# Patient Record
Sex: Female | Born: 1955 | Hispanic: No | Marital: Married | Smoking: Never smoker
Health system: Southern US, Community
[De-identification: ages and names within clinical notes are randomized; demographics above are authoritative.]

## PROBLEM LIST (undated history)

## (undated) DIAGNOSIS — J9601 Acute respiratory failure with hypoxia: Secondary | ICD-10-CM

## (undated) DIAGNOSIS — E669 Obesity, unspecified: Secondary | ICD-10-CM

## (undated) DIAGNOSIS — J9612 Chronic respiratory failure with hypercapnia: Secondary | ICD-10-CM

## (undated) DIAGNOSIS — I10 Essential (primary) hypertension: Secondary | ICD-10-CM

## (undated) DIAGNOSIS — E662 Morbid (severe) obesity with alveolar hypoventilation: Secondary | ICD-10-CM

## (undated) DIAGNOSIS — J45909 Unspecified asthma, uncomplicated: Secondary | ICD-10-CM

## (undated) DIAGNOSIS — G4733 Obstructive sleep apnea (adult) (pediatric): Secondary | ICD-10-CM

## (undated) HISTORY — DX: Acute respiratory failure with hypoxia: J96.01

## (undated) HISTORY — DX: Morbid (severe) obesity due to excess calories: E66.01

## (undated) HISTORY — PX: HERNIA REPAIR: SHX51

## (undated) HISTORY — PX: JOINT REPLACEMENT: SHX530

---

## 2017-09-20 DIAGNOSIS — S8002XA Contusion of left knee, initial encounter: Secondary | ICD-10-CM | POA: Insufficient documentation

## 2017-09-20 DIAGNOSIS — S93492A Sprain of other ligament of left ankle, initial encounter: Secondary | ICD-10-CM | POA: Insufficient documentation

## 2017-09-21 ENCOUNTER — Ambulatory Visit: Payer: Self-pay | Admitting: Family Medicine

## 2018-01-21 ENCOUNTER — Emergency Department (HOSPITAL_COMMUNITY)
Admission: EM | Admit: 2018-01-21 | Discharge: 2018-01-21 | Disposition: A | Discharge status: Home / Self Care | Payer: Self-pay | Attending: Emergency Medicine | Admitting: Emergency Medicine

## 2018-01-21 ENCOUNTER — Encounter (HOSPITAL_COMMUNITY): Payer: Self-pay

## 2018-01-21 DIAGNOSIS — J4521 Mild intermittent asthma with (acute) exacerbation: Secondary | ICD-10-CM | POA: Insufficient documentation

## 2018-01-21 DIAGNOSIS — I10 Essential (primary) hypertension: Secondary | ICD-10-CM | POA: Insufficient documentation

## 2018-01-21 HISTORY — DX: Essential (primary) hypertension: I10

## 2018-01-21 HISTORY — DX: Unspecified asthma, uncomplicated: J45.909

## 2018-01-21 HISTORY — DX: Obesity, unspecified: E66.9

## 2018-01-21 MED ORDER — IPRATROPIUM BROMIDE 0.02 % IN SOLN
1.0000 mg | Freq: Once | RESPIRATORY_TRACT | Status: AC
  Administered 2018-01-21: 1 mg via RESPIRATORY_TRACT
  Filled 2018-01-21: qty 5

## 2018-01-21 MED ORDER — ALBUTEROL (5 MG/ML) CONTINUOUS INHALATION SOLN
10.0000 mg/h | INHALATION_SOLUTION | Freq: Once | RESPIRATORY_TRACT | Status: AC
  Administered 2018-01-21: 10 mg/h via RESPIRATORY_TRACT
  Filled 2018-01-21: qty 20

## 2018-01-21 MED ORDER — METHYLPREDNISOLONE SODIUM SUCC 125 MG IJ SOLR
125.0000 mg | Freq: Once | INTRAMUSCULAR | Status: AC
  Administered 2018-01-21: 125 mg via INTRAMUSCULAR
  Filled 2018-01-21: qty 2

## 2018-01-21 MED ORDER — ALBUTEROL SULFATE (2.5 MG/3ML) 0.083% IN NEBU
INHALATION_SOLUTION | RESPIRATORY_TRACT | Status: AC
  Filled 2018-01-21: qty 6

## 2018-01-21 MED ORDER — ALBUTEROL SULFATE (2.5 MG/3ML) 0.083% IN NEBU
5.0000 mg | INHALATION_SOLUTION | Freq: Once | RESPIRATORY_TRACT | Status: AC
  Administered 2018-01-21: 5 mg via RESPIRATORY_TRACT

## 2018-01-21 MED ORDER — ALBUTEROL SULFATE HFA 108 (90 BASE) MCG/ACT IN AERS: 2.0000 | INHALATION_SPRAY | Freq: Once | RESPIRATORY_TRACT | Status: DC

## 2018-01-21 NOTE — ED Provider Notes (Signed)
TIME SEEN: 12:59 AM  CHIEF COMPLAINT: Asthma exacerbation  HPI: Patient is a 62 year old female with history of hypertension, obesity, asthma who presents to the emergency department with shortness of breath with exertion.  States she has had a cold over the past week and has been wheezing.  No fever.  Has had nonproductive cough.  No chest pain or chest discomfort.  States she needs albuterol inhaler.  Has been using nebulizers at home and is on steroids currently.  Was seen by her PCP last week for the same.  States she is here visiting her father when a nurse noticed that she appeared short of breath walking down the hallway.  ROS: See HPI Constitutional: no fever  Eyes: no drainage  ENT: no runny nose   Cardiovascular:  no chest pain  Resp: no SOB  GI: no vomiting GU: no dysuria Integumentary: no rash  Allergy: no hives  Musculoskeletal: no leg swelling  Neurological: no slurred speech ROS otherwise negative  PAST MEDICAL HISTORY/PAST SURGICAL HISTORY:  Past Medical History:  Diagnosis Date  . Asthma   . Hypertension   . Obesity     MEDICATIONS:  Prior to Admission medications   Not on File    ALLERGIES:  No Known Allergies  SOCIAL HISTORY:  Social History   Tobacco Use  . Smoking status: Never Smoker  . Smokeless tobacco: Never Used  Substance Use Topics  . Alcohol use: Never    Frequency: Never    FAMILY HISTORY: No family history on file.  EXAM: BP (!) 155/84   Pulse 96   Temp 97.8 F (36.6 C) (Oral)   Resp 18   SpO2 96%  CONSTITUTIONAL: Alert and oriented and responds appropriately to questions. Well-appearing; well-nourished obese HEAD: Normocephalic EYES: Conjunctivae clear, pupils appear equal, EOMI ENT: normal nose; moist mucous membranes NECK: Supple, no meningismus, no nuchal rigidity, no LAD  CARD: RRR; S1 and S2 appreciated; no murmurs, no clicks, no rubs, no gallops RESP: Normal chest excursion without splinting or tachypnea; taking full  sentences, expiratory wheezes on examination, diminished at bases bilaterally, no rhonchi or rales ABD/GI: Normal bowel sounds; non-distended; soft, non-tender, no rebound, no guarding, no peritoneal signs, no hepatosplenomegaly BACK:  The back appears normal and is non-tender to palpation, there is no CVA tenderness EXT: Normal ROM in all joints; non-tender to palpation; no edema; normal capillary refill; no cyanosis, no calf tenderness or swelling    SKIN: Normal color for age and race; warm; no rash NEURO: Moves all extremities equally PSYCH: The patient's mood and manner are appropriate. Grooming and personal hygiene are appropriate.  MEDICAL DECISION MAKING: Patient here with asthma exacerbation.  Will give breathing treatment and reassess.  At this time she refuses labs, chest x-ray.  Low suspicion for pneumothorax.  Does not appear volume overloaded.  States she recently had a chest x-ray several days ago with her PCP that did not show pneumonia.  ED PROGRESS: Patient still wheezing after one breathing treatment.  Will give continuous treatment and IM Solu-Medrol.   Patient's lungs are now clear after continuous treatment.  She is not hypoxic.  I feel she is safe to be discharged.  She is already on prednisone.  Will discharge with albuterol inhaler.  Discussed return precautions.  Suspect viral illness exacerbating her symptoms.  I do not feel she needs antibiotics.   At this time, I do not feel there is any life-threatening condition present. I have reviewed and discussed all results (EKG, imaging,  lab, urine as appropriate) and exam findings with patient/family. I have reviewed nursing notes and appropriate previous records.  I feel the patient is safe to be discharged home without further emergent workup and can continue workup as an outpatient as needed. Discussed usual and customary return precautions. Patient/family verbalize understanding and are comfortable with this plan.  Outpatient  follow-up has been provided if needed. All questions have been answered.   CRITICAL CARE Performed by: Rochele Raring   Total critical care time: 40 minutes  Critical care time was exclusive of separately billable procedures and treating other patients.  Critical care was necessary to treat or prevent imminent or life-threatening deterioration.  Critical care was time spent personally by me on the following activities: development of treatment plan with patient and/or surrogate as well as nursing, discussions with consultants, evaluation of patient's response to treatment, examination of patient, obtaining history from patient or surrogate, ordering and performing treatments and interventions, ordering and review of laboratory studies, ordering and review of radiographic studies, pulse oximetry and re-evaluation of patient's condition.      Ward, Layla Maw, DO 01/21/18 [phone]

## 2018-01-21 NOTE — ED Triage Notes (Signed)
Pt states that she has asthma ans has been wheezing all week, took a breathing treatment today. Was visiting her father today and unable to ambulate down the hallway.

## 2018-01-21 NOTE — ED Notes (Signed)
Pt discharged from ED; instructions provided; Pt encouraged to return to ED if symptoms worsen and to f/u with PCP; Pt verbalized understanding of all instructions 

## 2018-06-17 ENCOUNTER — Telehealth: Payer: Self-pay | Admitting: Internal Medicine

## 2018-06-17 NOTE — Telephone Encounter (Signed)
Copied from CRM [id number]. Topic: Appointment Scheduling - Prior Auth Required for Appointment >> Jun 17, 2018 10:17 AM Jolayne Haines L wrote: Patient states her mother is a patient of Dr Okey Dupre and was highly recommended to her by Brooks Sailors (mother). Patient just moved her from [place]. Right now she does not have insurance but will in about 60 days with her husbands company. She also would like her son to be established as well. Please Advise.

## 2018-06-18 NOTE — Telephone Encounter (Signed)
Patient is aware of provider response.  Patient does need to be seen before the 60 day time frame.  She is going to call back tomorrow to schedule new patient appointment with Dr. Okey Ryan.  Patient is aware that she will be self pay $80.00 up front.

## 2018-06-18 NOTE — Telephone Encounter (Signed)
Ok, would recommend to wait until they have insurance

## 2018-07-27 ENCOUNTER — Emergency Department (HOSPITAL_COMMUNITY): Payer: Self-pay

## 2018-07-27 ENCOUNTER — Encounter: Payer: Self-pay | Admitting: Emergency Medicine

## 2018-07-27 ENCOUNTER — Inpatient Hospital Stay (HOSPITAL_COMMUNITY)
Admission: EM | Admit: 2018-07-27 | Discharge: 2018-07-31 | DRG: 189 | Disposition: A | Discharge status: Home Health | Payer: Self-pay | Attending: Internal Medicine | Admitting: Internal Medicine

## 2018-07-27 DIAGNOSIS — T380X5A Adverse effect of glucocorticoids and synthetic analogues, initial encounter: Secondary | ICD-10-CM | POA: Diagnosis not present

## 2018-07-27 DIAGNOSIS — Z79899 Other long term (current) drug therapy: Secondary | ICD-10-CM

## 2018-07-27 DIAGNOSIS — R590 Localized enlarged lymph nodes: Secondary | ICD-10-CM | POA: Diagnosis present

## 2018-07-27 DIAGNOSIS — N179 Acute kidney failure, unspecified: Secondary | ICD-10-CM | POA: Diagnosis present

## 2018-07-27 DIAGNOSIS — R059 Cough, unspecified: Secondary | ICD-10-CM

## 2018-07-27 DIAGNOSIS — R05 Cough: Secondary | ICD-10-CM

## 2018-07-27 DIAGNOSIS — I130 Hypertensive heart and chronic kidney disease with heart failure and stage 1 through stage 4 chronic kidney disease, or unspecified chronic kidney disease: Secondary | ICD-10-CM | POA: Diagnosis present

## 2018-07-27 DIAGNOSIS — J4540 Moderate persistent asthma, uncomplicated: Secondary | ICD-10-CM | POA: Insufficient documentation

## 2018-07-27 DIAGNOSIS — F419 Anxiety disorder, unspecified: Secondary | ICD-10-CM | POA: Diagnosis present

## 2018-07-27 DIAGNOSIS — M25561 Pain in right knee: Secondary | ICD-10-CM | POA: Diagnosis present

## 2018-07-27 DIAGNOSIS — R0902 Hypoxemia: Secondary | ICD-10-CM

## 2018-07-27 DIAGNOSIS — Z8249 Family history of ischemic heart disease and other diseases of the circulatory system: Secondary | ICD-10-CM

## 2018-07-27 DIAGNOSIS — R0602 Shortness of breath: Secondary | ICD-10-CM

## 2018-07-27 DIAGNOSIS — Z87891 Personal history of nicotine dependence: Secondary | ICD-10-CM

## 2018-07-27 DIAGNOSIS — Z20828 Contact with and (suspected) exposure to other viral communicable diseases: Secondary | ICD-10-CM | POA: Diagnosis present

## 2018-07-27 DIAGNOSIS — J9601 Acute respiratory failure with hypoxia: Principal | ICD-10-CM

## 2018-07-27 DIAGNOSIS — I50810 Right heart failure, unspecified: Secondary | ICD-10-CM | POA: Diagnosis present

## 2018-07-27 DIAGNOSIS — G4700 Insomnia, unspecified: Secondary | ICD-10-CM | POA: Diagnosis present

## 2018-07-27 DIAGNOSIS — Z6841 Body Mass Index (BMI) 40.0 and over, adult: Secondary | ICD-10-CM

## 2018-07-27 DIAGNOSIS — I10 Essential (primary) hypertension: Secondary | ICD-10-CM

## 2018-07-27 DIAGNOSIS — J441 Chronic obstructive pulmonary disease with (acute) exacerbation: Secondary | ICD-10-CM | POA: Insufficient documentation

## 2018-07-27 DIAGNOSIS — N289 Disorder of kidney and ureter, unspecified: Secondary | ICD-10-CM

## 2018-07-27 DIAGNOSIS — J45901 Unspecified asthma with (acute) exacerbation: Secondary | ICD-10-CM | POA: Diagnosis present

## 2018-07-27 DIAGNOSIS — G8929 Other chronic pain: Secondary | ICD-10-CM

## 2018-07-27 DIAGNOSIS — X500XXA Overexertion from strenuous movement or load, initial encounter: Secondary | ICD-10-CM

## 2018-07-27 DIAGNOSIS — N183 Chronic kidney disease, stage 3 (moderate): Secondary | ICD-10-CM | POA: Diagnosis present

## 2018-07-27 LAB — CBC WITH DIFFERENTIAL/PLATELET
Abs Immature Granulocytes: 0.06 10*3/uL (ref 0.00–0.07)
Basophils Absolute: 0 10*3/uL (ref 0.0–0.1)
Basophils Relative: 0 %
Eosinophils Absolute: 0.1 10*3/uL (ref 0.0–0.5)
Eosinophils Relative: 1 %
HCT: 37.9 % (ref 36.0–46.0)
Hemoglobin: 11.2 g/dL — ABNORMAL LOW (ref 12.0–15.0)
Immature Granulocytes: 1 %
Lymphocytes Relative: 23 %
Lymphs Abs: 2.2 10*3/uL (ref 0.7–4.0)
MCH: 28.6 pg (ref 26.0–34.0)
MCHC: 29.6 g/dL — ABNORMAL LOW (ref 30.0–36.0)
MCV: 96.7 fL (ref 80.0–100.0)
Monocytes Absolute: 0.7 10*3/uL (ref 0.1–1.0)
Monocytes Relative: 7 %
Neutro Abs: 6.5 10*3/uL (ref 1.7–7.7)
Neutrophils Relative %: 68 %
Platelets: 259 10*3/uL (ref 150–400)
RBC: 3.92 MIL/uL (ref 3.87–5.11)
RDW: 12.8 % (ref 11.5–15.5)
WBC: 9.6 10*3/uL (ref 4.0–10.5)
nRBC: 0 % (ref 0.0–0.2)

## 2018-07-27 LAB — COMPREHENSIVE METABOLIC PANEL
ALT: 16 U/L (ref 0–44)
AST: 17 U/L (ref 15–41)
Albumin: 3.6 g/dL (ref 3.5–5.0)
Alkaline Phosphatase: 64 U/L (ref 38–126)
Anion gap: 11 (ref 5–15)
BUN: 12 mg/dL (ref 8–23)
CO2: 32 mmol/L (ref 22–32)
Calcium: 10.2 mg/dL (ref 8.9–10.3)
Chloride: 97 mmol/L — ABNORMAL LOW (ref 98–111)
Creatinine, Ser: 1.24 mg/dL — ABNORMAL HIGH (ref 0.44–1.00)
GFR calc Af Amer: 54 mL/min — ABNORMAL LOW (ref >60)
GFR calc non Af Amer: 47 mL/min — ABNORMAL LOW (ref >60)
Glucose, Bld: 124 mg/dL — ABNORMAL HIGH (ref 70–99)
Potassium: 4.1 mmol/L (ref 3.5–5.1)
Sodium: 140 mmol/L (ref 135–145)
Total Bilirubin: 0.4 mg/dL (ref 0.3–1.2)
Total Protein: 7.3 g/dL (ref 6.5–8.1)

## 2018-07-27 LAB — LACTATE DEHYDROGENASE: LDH: 156 U/L (ref 98–192)

## 2018-07-27 LAB — TROPONIN I: Troponin I: <0.03 ng/mL (ref <0.03)

## 2018-07-27 LAB — C-REACTIVE PROTEIN: CRP: 2.5 mg/dL — ABNORMAL HIGH (ref <1.0)

## 2018-07-27 LAB — LACTIC ACID, PLASMA
Lactic Acid, Venous: 1.6 mmol/L (ref 0.5–1.9)
Lactic Acid, Venous: 3.1 mmol/L (ref 0.5–1.9)

## 2018-07-27 LAB — TRIGLYCERIDES: Triglycerides: 120 mg/dL (ref <150)

## 2018-07-27 LAB — FIBRINOGEN: Fibrinogen: 506 mg/dL — ABNORMAL HIGH (ref 210–475)

## 2018-07-27 LAB — PROCALCITONIN: Procalcitonin: <0.1 ng/mL

## 2018-07-27 LAB — SARS CORONAVIRUS 2 BY RT PCR (HOSPITAL ORDER, PERFORMED IN ~~LOC~~ HOSPITAL LAB): SARS Coronavirus 2: NEGATIVE

## 2018-07-27 LAB — FERRITIN: Ferritin: 66 ng/mL (ref 11–307)

## 2018-07-27 LAB — D-DIMER, QUANTITATIVE (NOT AT ARMC): D-Dimer, Quant: 1.5 ug/mL-FEU — ABNORMAL HIGH (ref 0.00–0.50)

## 2018-07-27 MED ORDER — ACETAMINOPHEN 500 MG PO TABS: 1000.0000 mg | ORAL_TABLET | Freq: Four times a day (QID) | ORAL | Status: DC | PRN

## 2018-07-27 MED ORDER — METHYLPREDNISOLONE SODIUM SUCC 125 MG IJ SOLR
125.0000 mg | Freq: Once | INTRAMUSCULAR | Status: AC
  Administered 2018-07-27: 125 mg via INTRAVENOUS
  Filled 2018-07-27: qty 2

## 2018-07-27 MED ORDER — ALBUTEROL SULFATE HFA 108 (90 BASE) MCG/ACT IN AERS
4.0000 | INHALATION_SPRAY | Freq: Once | RESPIRATORY_TRACT | Status: AC
  Administered 2018-07-27: 4 via RESPIRATORY_TRACT
  Filled 2018-07-27: qty 6.7

## 2018-07-27 MED ORDER — ALBUTEROL SULFATE HFA 108 (90 BASE) MCG/ACT IN AERS
1.0000 | INHALATION_SPRAY | RESPIRATORY_TRACT | Status: DC | PRN
  Filled 2018-07-27: qty 6.7

## 2018-07-27 MED ORDER — MAGNESIUM SULFATE 2 GM/50ML IV SOLN
2.0000 g | Freq: Once | INTRAVENOUS | Status: AC
  Administered 2018-07-27: 2 g via INTRAVENOUS
  Filled 2018-07-27: qty 50

## 2018-07-27 MED ORDER — IPRATROPIUM BROMIDE HFA 17 MCG/ACT IN AERS
2.0000 | INHALATION_SPRAY | Freq: Once | RESPIRATORY_TRACT | Status: DC
  Filled 2018-07-27: qty 12.9

## 2018-07-27 NOTE — ED Notes (Signed)
Pt requesting some coke and crackers  Given

## 2018-07-27 NOTE — ED Notes (Signed)
Pt reports that she does not need to [place] either inhaler at present she is just hungry

## 2018-07-27 NOTE — H&P (Addendum)
Date: 07/27/2018               Patient Name:  Janice PootDonna Ryan MRN: [id number]  DOB: 10/09/1955 Age / Sex: 63 y.o., female   [facility]         Medical Service: Internal Medicine Teaching Service         Attending Physician: Dr. Rush Landmarkegeler, Canary Brimhristopher J, *    First Contact: Dr. Jodelle RedAgyei, Obed Pager: [phone]  Second Contact: Dr. Ginette OttoMelvin, Alec Pager: [phone]       After Hours (After 5p/  First Contact Pager: [phone]  weekends / holidays): Second Contact Pager: [phone]   Chief Complaint: Wheezing  History of Present Illness:  [name] is a 63 yo F w/ PMH of HTN, Obesity and asthma presenting with shortness of breath with wheezing. She was in her usual state of health until 2 weeks ago when began to have worsening shortness of breath. She states she has had similar symptoms in the past w/ prior asthma exacerbation but none with such severity. She used her nebulizers at home about 4 times a day with mild relief. However, in the last 2 days she had acutely worsening symptoms with fever (~100F per patient report. States EMS told her she had 101.57F), increased orthopnea and lower extremity swelling as well as dull chest and back pain exacerbated by coughing. She states she has been having mostly dry cough with minimal sputum. She states she has 2 pillow orthopnea at baseline but had to increase to 3. She mentions that [facility] and was advised to come to the ED for evaluation.   She states she has no history of any cardiac disease but states she was prescribed furosemide 40mg  couple months ago by her PCP for the swelling. She denies any long travel, car ride, recent surgery, hx of malignancy or recent smoking history.  She also mentions that early this morning she heard a loud pop on her right knee while lifting some heavy suitcases and started having significant right knee pain with difficulty ambulating. She believes she may have  torn a ligament.   Per ED provider, EMS noted her oxygen saturation on 80s on arrival. Given dose of epinephrine as solumedrol was not available at the time. Continued to be hypoxic on 9L O2 .  In the ED, she was noted to be have increased oxygen requirement with O2sat of 93% on 5L O2. COVID rapid testing was found to be negative. Chest X-ray did not show any significant findings. She received 125mg  solumedrol and 2g IV Mag sulfate as well as albuterol inhaler as needed. She also reported falling and hitting her knee and knee x-ray was performed showing no acute findings.  Meds:  Albuterol inhaler PRN Duloxetine 60mg  daily Lisinopril-HCTZ 10-12.5mg  daily Advair diskus 1 puff BID Furosemide 40mg  daily Ambien 5mg  qhs daily Tramadol 50mg  bid prn  Allergies: Allergies as of 07/27/2018  . (No Known Allergies)   Past Medical History:  Diagnosis Date  . Asthma   . Hypertension   . Obesity    Family History: Father has CHF diagnosed in his 2360s. Mother has A.fib. No other significant family history.  Social History:  [place] moved from [place] Lives at home. Husband goes to grocery stores regularly. Wears masks. Son and patient has been staying home. Used to be 1/4 pack daily smoker in the past for ~40 years but quit 8 years ago. Denies any significant alcohol or illicit  substance use.   Review of Systems: A complete ROS was negative except as per HPI.  Physical Exam: Blood pressure 110/71, pulse 89, temperature 98 F (36.7 C), temperature source Oral, resp. rate 17, SpO2 99 %. Gen: Morbidly obese, NAD HEENT: NCAT head, MMM Neck: supple, ROM intact, no JVD, no cervical adenopathy CV: RRR, S1, S2 normal, No rubs, no murmurs, no gallops Pulm: Prolonged expiratory phase with expiratory wheezing, no rale Abd: Soft, BS+, NTND, No rebound, no guarding Extm: R lower extremity knee flexion limited by pain, neg anterior drawer, posterior drawer test. Unable to tolerate further maneuvers  2/2 pain, Peripheral pulses intact, 2+ pitting edema up to mid knees bilaterally. Skin: Dry, Warm, normal turgor, no rashes, lesions, wounds.  Neuro: AAOx3, Cranial Nerve II-XII intact, DTR intact Psych: Normal mood and affect  EKG: normal sinus, normal axis, no ST changes, no prior EKG for comparison.  CXR: personally reviewed my interpretation is poor penetration, rotated, no lobar consolidation, no pulmonary edema, no pleural effusion.  Assessment & Plan by Problem: Active Problems:   * No active hospital problems. *  [name] is a 62 yo F w/ PMH of HTN, Morbid obesity and asthma presenting with shortness of breath and wheezing most likely due to acute asthma exacerbation. She requires inpatient admission for new oxygen requirement. She has prior hx ED visit for asthma exacerbation last noted to be on 01/2018. COVID-19 rapid testing negative but w/ report of fever, may have other viral illness considering acute onset worsening symptoms. She has significant pitting edema on exam with hx of orthopnea and family hx of cardiac disease concerning for undiagnosed congestive heart failure which also will need to be worked up. She also had elevated d-dimer on ED labs which will need follow up with imaging.  Acute hypoxic respiratory failure 2/2 asthma exacerbation vs acute congestive heart failure vs pulmonary embolism On RA at home. Significant wheezing and pitting edema on exam. Chest X-ray poor quality but no obvious findings. Pro-calcitonin <0.1. Wells score of 0 but D-dimer 1.5 (elevated age adjusted) COVID-19 rapid testing negative. Negative troponins. Received solumedrol and mag in ED. No formal PFTs found on chart review. Lactate 3.1. Trops<0.03x1.  - Keep O2 sat >88 - Prednisone 40mg  daily starting tomorrow - Albuterol q4hr PRN for wheezing - Repeat Chest X-ray 2-view - Trend Troponins - BNP, will need TTE if elevated to assess cardiac function - CTA chest - Respiratory Viral  Panel  Acute Kidney Injury vs Chronic Kidney Disease Creatinine 1.24. Unclear baseline. K 4.1  - Avoid nephrotoxic meds - Trend Bmp  Right knee pain 2/2 arthritis vs ligament injury Acute onset knee pain while lifting heavy objects. Arthritic changes noted X-ray. Knee brace placed in ED. No obvious ligament injury for effusion on exam.  - C/w knee brace - Tylenol 650mg  PRN for pain - PT eval and treat  HTN Admit bp 110/71. Takes lisinopril-HCTZ 10-12.5mg  daily at home - BP acceptable, holding home bp meds for now  Hx of insomnia/anxiety - C/w home med: duloxetine 60mg  daily, zolpidem 5mg  qhs PRN  DVT prophx: Lovenox Diet: Regular Bowel: Senokot Code: Full  Dispo: Admit patient to Inpatient with expected length of stay greater than 2 midnights.  Signed: Theotis Barrio, MD 07/27/2018, 11:19 PM  Pager: [phone]

## 2018-07-27 NOTE — ED Triage Notes (Signed)
Pt here from home with with c/o sob knee pain ,pt does have hx of asthma , pt received epi and tylenol from ems , temp 98 on arrival

## 2018-07-27 NOTE — ED Provider Notes (Signed)
[facility] Provider Note   CSN: [id number] Arrival date & time: 07/27/18  1815    History   Chief Complaint Chief Complaint  Patient presents with   Shortness of Breath    HPI Janice Ryan is a 63 y.o. female.     The history is provided by the patient and medical records. No language interpreter was used.  Shortness of Breath  Severity:  Severe Onset quality:  Gradual Duration:  2 weeks Timing:  Constant Progression:  Worsening Chronicity:  New Context: known allergens   Relieved by:  Oxygen Worsened by:  Nothing Ineffective treatments:  Inhaler Associated symptoms: cough, fever and wheezing   Associated symptoms: no abdominal pain, no chest pain, no claudication, no diaphoresis, no headaches, no neck pain, no rash, no sore throat, no sputum production and no vomiting   Risk factors: obesity     Past Medical History:  Diagnosis Date   Asthma    Hypertension    Obesity     There are no active problems to display for this patient.   Past Surgical History:  Procedure Laterality Date   HERNIA REPAIR     JOINT REPLACEMENT       OB History   No obstetric history on file.      Home Medications    Prior to Admission medications   Not on File    Family History History reviewed. No pertinent family history.  Social History Social History   Tobacco Use   Smoking status: Never Smoker   Smokeless tobacco: Never Used  Substance Use Topics   Alcohol use: Never    Frequency: Never   Drug use: Never     Allergies   Patient has no known allergies.   Review of Systems Review of Systems  Constitutional: Positive for chills, fatigue and fever. Negative for diaphoresis.  HENT: Negative for congestion and sore throat.   Eyes: Negative for visual disturbance.  Respiratory: Positive for cough, chest tightness, shortness of breath and wheezing. Negative for sputum production and stridor.     Cardiovascular: Negative for chest pain, palpitations and claudication.  Gastrointestinal: Negative for abdominal pain, constipation, diarrhea, nausea and vomiting.  Genitourinary: Negative for frequency.  Musculoskeletal: Negative for back pain, neck pain and neck stiffness.  Skin: Negative for rash.  Neurological: Negative for dizziness, weakness, light-headedness and headaches.  Psychiatric/Behavioral: Negative for agitation and confusion.  All other systems reviewed and are negative.    Physical Exam Updated Vital Signs BP (!) 121/105    Pulse 91    Temp 98 F (36.7 C) (Oral)    Resp 15    SpO2 100%   Physical Exam Vitals signs and nursing note reviewed.  Constitutional:      General: She is not in acute distress.    Appearance: She is well-developed. She is not ill-appearing or toxic-appearing.  HENT:     Head: Normocephalic and atraumatic.  Eyes:     Conjunctiva/sclera: Conjunctivae normal.     Pupils: Pupils are equal, round, and reactive to light.  Neck:     Musculoskeletal: Neck supple.  Cardiovascular:     Rate and Rhythm: Normal rate and regular rhythm.     Heart sounds: No murmur.  Pulmonary:     Effort: Pulmonary effort is normal. Tachypnea present. No respiratory distress.     Breath sounds: Wheezing and rhonchi present.  Chest:     Chest wall: No tenderness.  Abdominal:     Palpations:  Abdomen is soft.     Tenderness: There is no abdominal tenderness.  Musculoskeletal:     Right knee: She exhibits no deformity. Tenderness found.       Legs:     Comments: Diffuse tenderness in the right knee.  Normal pulse, sensation, and strength in ankle.  No deformity.  Skin:    General: Skin is warm and dry.     Capillary Refill: Capillary refill takes less than 2 seconds.     Coloration: Skin is not pale.  Neurological:     General: No focal deficit present.     Mental Status: She is alert.      ED Treatments / Results  Labs (all labs ordered are listed,  but only abnormal results are displayed) Labs Reviewed  CBC WITH DIFFERENTIAL/PLATELET - Abnormal; Notable for the following components:      Result Value   Hemoglobin 11.2 (*)    MCHC 29.6 (*)    All other components within normal limits  COMPREHENSIVE METABOLIC PANEL - Abnormal; Notable for the following components:   Chloride 97 (*)    Glucose, Bld 124 (*)    Creatinine, Ser 1.24 (*)    GFR calc non Af Amer 47 (*)    GFR calc Af Amer 54 (*)    All other components within normal limits  LACTIC ACID, PLASMA - Abnormal; Notable for the following components:   Lactic Acid, Venous 3.1 (*)    All other components within normal limits  D-DIMER, QUANTITATIVE (NOT AT [facility]) - Abnormal; Notable for the following components:   D-Dimer, Quant 1.50 (*)    All other components within normal limits  FIBRINOGEN - Abnormal; Notable for the following components:   Fibrinogen 506 (*)    All other components within normal limits  C-REACTIVE PROTEIN - Abnormal; Notable for the following components:   CRP 2.5 (*)    All other components within normal limits  SARS CORONAVIRUS 2 (HOSPITAL ORDER, PERFORMED IN [facility])  CULTURE, BLOOD (ROUTINE X 2)  CULTURE, BLOOD (ROUTINE X 2)  TROPONIN I  LACTIC ACID, PLASMA  PROCALCITONIN  LACTATE DEHYDROGENASE  FERRITIN  TRIGLYCERIDES    EKG EKG Interpretation  Date/Time:  Saturday July 27 2018 23:17:45 EDT Ventricular Rate:  91 PR Interval:    QRS Duration: 81 QT Interval:  363 QTC Calculation: 447 R Axis:   71 Text Interpretation:  Sinus rhythm Low voltage, precordial leads No prior ECG for comparison.  No STEMI Confirmed by Theda Belfast ([id number]) on 07/27/2018 11:36:11 PM   Radiology Dg Chest Portable 1 View  Result Date: 07/27/2018 CLINICAL DATA:  Fever, cough, hypoxia EXAM: PORTABLE CHEST 1 VIEW COMPARISON:  None. FINDINGS: Heart is mildly enlarged. No confluent airspace opacities or effusions. No acute bony abnormality.  IMPRESSION: Cardiomegaly.  No active disease. Electronically Signed   By: Charlett Nose M.D.   On: 07/27/2018 20:32   Dg Knee Complete 4 Views Right  Result Date: 07/27/2018 CLINICAL DATA:  Fall with knee pain EXAM: RIGHT KNEE - COMPLETE 4+ VIEW COMPARISON:  None. FINDINGS: No fracture or malalignment. Advanced arthritis medial compartment of the knee and patellofemoral compartment. No definitive knee effusion. IMPRESSION: Arthritis of the knee without acute osseous abnormality. Electronically Signed   By: Jasmine Pang M.D.   On: 07/27/2018 20:30    Procedures Procedures (including critical care time)  CRITICAL CARE Performed by: Canary Brim Lavern Crimi Total critical care time: 35 minutes Critical care time was exclusive of separately  billable procedures and treating other patients. Critical care was necessary to treat or prevent imminent or life-threatening deterioration. Critical care was time spent personally by me on the following activities: development of treatment plan with patient and/or surrogate as well as nursing, discussions with consultants, evaluation of patient's response to treatment, examination of patient, obtaining history from patient or surrogate, ordering and performing treatments and interventions, ordering and review of laboratory studies, ordering and review of radiographic studies, pulse oximetry and re-evaluation of patient's condition.   Medications Ordered in ED Medications  ipratropium (ATROVENT HFA) inhaler 2 puff (has no administration in time range)  albuterol (VENTOLIN HFA) 108 (90 Base) MCG/ACT inhaler 1-2 puff (has no administration in time range)  magnesium sulfate IVPB 2 g 50 mL (0 g Intravenous Stopped 07/27/18 2134)  methylPREDNISolone sodium succinate (SOLU-MEDROL) 125 mg/2 mL injection 125 mg (125 mg Intravenous Given 07/27/18 1914)  albuterol (VENTOLIN HFA) 108 (90 Base) MCG/ACT inhaler 4 puff (4 puffs Inhalation Given 07/27/18 1914)     Initial  Impression / Assessment and Plan / ED Course  I have reviewed the triage vital signs and the nursing notes.  Pertinent labs & imaging results that were available during my care of the patient were reviewed by me and considered in my medical decision making (see chart for details).        Janice Ryan is a 63 y.o. female with a past medical history significant for obesity, hypertension, and asthma who presents for 2 weeks of intermittent fevers, chills, cough, worsening shortness of breath, wheezing, and a fall with right knee pain today.  Patient reports that she has had fevers on and off for the last 2 weeks up to 101 measured orally.  She reports she has been having chills and a nonproductive cough with this.  She denies any significant chest pain but does report some chest tightness.  She reports her wheezing has been bad and she thinks is related to both an infection and the allergies currently present in the environment.  She says that she has been using her inhalers and after having to do several nebulized treatments today, she called for help.  EMS reportedly found her with oxygen saturations in the 70s and placed her on 9 L.  On arrival, she has been weaned to 4 L nasal cannula however she does not take oxygen at home by report.  She reports that when she was having such difficulty breathing she tripped and fell hurting her right knee and she felt a loud pop.  She reports severe pain and cannot bear weight on her right knee.  She denies any surgery on this knee.  She denies numbness, tingling, or weakness in the ankle or foot.  She denies any other injuries.  She denies back pain, abdominal pain, or chest pain.  She denies other complaints.  EMS reportedly used subcutaneous epinephrine as they did not have any Solu-Medrol available.  Patient received oxygen in route.  On exam, patient has wheezing and rhonchi in all lung fields.  Chest is nontender.  No murmur.  Abdomen is nontender.   Patient has tenderness in her right knee and has a makeshift knee brace in place.  Patient has normal DP pulse, sensation, and strength in the ankle.  Clinically I am concerned about a soft tissue or ligamentous injury in the right knee however will obtain x-ray to rule out fracture.  She will be placed in a knee immobilizer after this.  I am more concerned  however about her new oxygen requirement with fevers, cough, shortness of breath, and severe wheezing.  She will be given IV magnesium and Solu-Medrol.  She will be given puffs of albuterol and Atrovent.  Will obtain chest x-ray and screening labs and will be ruled out for coronavirus.  Due to new oxygen requirement, anticipate admission.  Will rule out infectious etiology however I suspect it is primarily asthma exacerbation due to the pollen.  10:49 PM Patient's laboratory testing began to return.  Initial lactic acid was normal but increased to 3.1 on subsequent value.  CBC shows no leukocytosis and mild anemia.  Metabolic panel shows an elevation in creatinine of 1.21.  Negative troponin.  Morrie Sheldon, much greater suspicion for asthma given her history of the same with similar symptoms versus an infectious etiology with her cough and we were not suspecting pulmonary embolism.  The d-dimer was ordered in conjunction with the coronavirus order set although it was positive.  Patient's eburnation and C-reactive protein were also elevated in the setting of the coronavirus order set.  Chest x-ray does not show pneumonia and her knee x-ray did not show dislocation or fracture.  There was evidence of arthritis.  A knee immobilizer will be placed.  Patient will need to follow-up with orthopedics for likely more advanced imaging to look for ligamentous injury given the pop and severe pain although I do not feel she has a fracture currently.  On reassessment, patient has improvement in her wheezing but is still on 5 L nasal cannula to keep her oxygen saturation  between 90 to 93%.  She does not take oxygen at home and thus she is not safe to go home.  She still has some wheezing but it has improved.  She received the magnesium Solu-Medrol the epinephrine with EMS and the albuterol/Atrovent here.  Due to the oxygen requirement, she will be admitted.  Medicine team called for admission.   Final Clinical Impressions(s) / ED Diagnoses   Final diagnoses:  Hypoxia  SOB (shortness of breath)  Cough  Asthma with acute exacerbation, unspecified asthma severity, unspecified whether persistent    ED Discharge Orders    None      Clinical Impression: 1. Hypoxia   2. SOB (shortness of breath)   3. Cough   4. Asthma with acute exacerbation, unspecified asthma severity, unspecified whether persistent     Disposition: Admit  This note was prepared with assistance of Dragon voice recognition software. Occasional wrong-word or sound-a-like substitutions may have occurred due to the inherent limitations of voice recognition software.                 Kimi Kroft, Canary Brim, MD 07/27/18 [phone]

## 2018-07-27 NOTE — ED Notes (Signed)
The labd has not resulted the carona  Test yet   Will be soon maybe one hour

## 2018-07-27 NOTE — ED Notes (Signed)
The pt has talked to the admitting doc tor on the phone

## 2018-07-28 ENCOUNTER — Inpatient Hospital Stay (HOSPITAL_COMMUNITY): Payer: Self-pay

## 2018-07-28 DIAGNOSIS — J4540 Moderate persistent asthma, uncomplicated: Secondary | ICD-10-CM | POA: Insufficient documentation

## 2018-07-28 DIAGNOSIS — R0902 Hypoxemia: Secondary | ICD-10-CM

## 2018-07-28 DIAGNOSIS — J9601 Acute respiratory failure with hypoxia: Secondary | ICD-10-CM

## 2018-07-28 DIAGNOSIS — J441 Chronic obstructive pulmonary disease with (acute) exacerbation: Secondary | ICD-10-CM | POA: Insufficient documentation

## 2018-07-28 DIAGNOSIS — R911 Solitary pulmonary nodule: Secondary | ICD-10-CM

## 2018-07-28 DIAGNOSIS — R011 Cardiac murmur, unspecified: Secondary | ICD-10-CM

## 2018-07-28 DIAGNOSIS — J45901 Unspecified asthma with (acute) exacerbation: Secondary | ICD-10-CM

## 2018-07-28 DIAGNOSIS — E872 Acidosis: Secondary | ICD-10-CM

## 2018-07-28 DIAGNOSIS — R0602 Shortness of breath: Secondary | ICD-10-CM

## 2018-07-28 LAB — RESPIRATORY PANEL BY PCR

## 2018-07-28 LAB — HIV ANTIBODY (ROUTINE TESTING W REFLEX): HIV Screen 4th Generation wRfx: NONREACTIVE

## 2018-07-28 LAB — BASIC METABOLIC PANEL
Anion gap: 15 (ref 5–15)
BUN: 13 mg/dL (ref 8–23)
CO2: 28 mmol/L (ref 22–32)
Calcium: 10.3 mg/dL (ref 8.9–10.3)
Chloride: 98 mmol/L (ref 98–111)
Creatinine, Ser: 1.3 mg/dL — ABNORMAL HIGH (ref 0.44–1.00)
GFR calc Af Amer: 51 mL/min — ABNORMAL LOW (ref >60)
GFR calc non Af Amer: 44 mL/min — ABNORMAL LOW (ref >60)
Glucose, Bld: 206 mg/dL — ABNORMAL HIGH (ref 70–99)
Potassium: 4.5 mmol/L (ref 3.5–5.1)
Sodium: 141 mmol/L (ref 135–145)

## 2018-07-28 LAB — TROPONIN I: Troponin I: <0.03 ng/mL (ref <0.03)

## 2018-07-28 LAB — BRAIN NATRIURETIC PEPTIDE: B Natriuretic Peptide: 57.5 pg/mL (ref 0.0–100.0)

## 2018-07-28 LAB — ECHOCARDIOGRAM LIMITED

## 2018-07-28 LAB — LACTIC ACID, PLASMA: Lactic Acid, Venous: 2.8 mmol/L (ref 0.5–1.9)

## 2018-07-28 MED ORDER — SODIUM CHLORIDE 0.9 % IV SOLN
INTRAVENOUS | Status: AC
  Administered 2018-07-28: 04:00:00 via INTRAVENOUS

## 2018-07-28 MED ORDER — DULOXETINE HCL 60 MG PO CPEP
60.0000 mg | ORAL_CAPSULE | Freq: Every day | ORAL | Status: DC
  Administered 2018-07-28 – 2018-07-31 (×4): 60 mg via ORAL
  Filled 2018-07-28 (×4): qty 1

## 2018-07-28 MED ORDER — PERFLUTREN LIPID MICROSPHERE
4.0000 mL | Freq: Once | INTRAVENOUS | Status: AC
  Administered 2018-07-28: 4 mL via INTRAVENOUS

## 2018-07-28 MED ORDER — ACETAMINOPHEN 650 MG RE SUPP: 650.0000 mg | Freq: Four times a day (QID) | RECTAL | Status: DC | PRN

## 2018-07-28 MED ORDER — ENOXAPARIN SODIUM 40 MG/0.4ML ~~LOC~~ SOLN
40.0000 mg | Freq: Every day | SUBCUTANEOUS | Status: DC
  Administered 2018-07-28 – 2018-07-30 (×3): 40 mg via SUBCUTANEOUS
  Filled 2018-07-28 (×3): qty 0.4

## 2018-07-28 MED ORDER — ZOLPIDEM TARTRATE 5 MG PO TABS
5.0000 mg | ORAL_TABLET | Freq: Every evening | ORAL | Status: DC | PRN
  Administered 2018-07-28 – 2018-07-30 (×3): 5 mg via ORAL
  Filled 2018-07-28 (×3): qty 1

## 2018-07-28 MED ORDER — PREDNISONE 50 MG PO TABS
50.0000 mg | ORAL_TABLET | Freq: Every day | ORAL | Status: AC
  Administered 2018-07-28 – 2018-07-30 (×3): 50 mg via ORAL
  Filled 2018-07-28 (×3): qty 1

## 2018-07-28 MED ORDER — ACETAMINOPHEN 325 MG PO TABS
650.0000 mg | ORAL_TABLET | Freq: Four times a day (QID) | ORAL | Status: DC | PRN
  Filled 2018-07-28: qty 2

## 2018-07-28 MED ORDER — TRAMADOL HCL 50 MG PO TABS
50.0000 mg | ORAL_TABLET | Freq: Two times a day (BID) | ORAL | Status: DC | PRN
  Administered 2018-07-28 – 2018-07-31 (×5): 50 mg via ORAL
  Filled 2018-07-28 (×5): qty 1

## 2018-07-28 MED ORDER — OXYCODONE HCL 5 MG PO TABS: 10.0000 mg | ORAL_TABLET | Freq: Once | ORAL | Status: DC

## 2018-07-28 MED ORDER — IOHEXOL 350 MG/ML SOLN
100.0000 mL | Freq: Once | INTRAVENOUS | Status: AC | PRN
  Administered 2018-07-28: 100 mL via INTRAVENOUS

## 2018-07-28 NOTE — Progress Notes (Signed)
CRITICAL VALUE ALERT  Critical Value: lactic acid 2.8  Date & Time Notied:  07/28/2018/0820  Provider Notified:Vogel  Orders Received/Actions taken: provider came up to nurse's station. No further orders given at this time.

## 2018-07-28 NOTE — Progress Notes (Signed)
  Echocardiogram 2D Echocardiogram has been performed.  A limited echocardiogram was performed in accordance with the Director's protocol during Covid 19 to limit the patient to technician exposure.  Belva Chimes 07/28/2018, 11:58 AM

## 2018-07-28 NOTE — ED Notes (Signed)
Unsuccessful attempt to call report  Charge nurse assigning a nurse for this pt

## 2018-07-28 NOTE — Progress Notes (Addendum)
Subjective: HD#0   Overnight: Admitted   Today, Ms. Boykin-Jeffries reports the she is feeling better after all the medication she received in the ED. She indicates that her wheezing and shortness of breath has improved though she still experiences mild wheezing when she moves around. She continues to endorse right knee pain this morning.   Objective:  Vital signs in last 24 hours: Vitals:   07/28/18 0049 07/28/18 0051 07/28/18 0503 07/28/18 0603  BP:   (!) 103/47 138/68  Pulse: 92 91 93 90  Resp: 18 18 18    Temp:   98 F (36.7 C)   TempSrc:   Oral   SpO2: 94% 94% 94%    Const: Pleasant, lying comfortably in bed HEENT: [place] in place, [place]/AT Resp: Audible expiratory wheezes, mild wheezes posteriorly on auscultation  CV: RRR, 2/6 systolic  Murmur  Abd: NS+ Ext: Trace bilateral edema, right knee NTTP, no erythema   Assessment/Plan:  Active Problems:   Asthma exacerbation  Ms.Boykin-Jefferies is a 63 yo F w/ PMH of HTN, Morbid obesity and asthma  who presented with 2-week history of worsening dyspnea and associated wheezing.  She did report of subjective fever with T-max of 101.6 Fahrenheit.  On arrival to the ED, she was hypoxic to the 80s on 9 L nasal cannula and received epinephrine.  Oxygen saturation improved to 93% on 5 L nasal cannula and was slowly titrated down to 2 L.  In the ED, she received Solu-Medrol 125 mg, IV magnesium sulfate 2 g and albuterol inhaler.  Her COVID-19 rapid testing was unremarkable.  Acute hypoxic respiratory failure 2/2 asthma exacerbation vs acute congestive heart failure: Presents with 2-week history of worsening dyspnea on exertion.  Noted to be hypoxic to 80s on arrival to the ED on 9 L nasal cannula.  She is status post IV Solu-Medrol, magnesium sulfate and albuterol inhaler.  Her symptoms have significantly improved this morning as she denies wheezing or shortness of breath.  In addition, her oxygen saturation has improved since arrival and  currently SPO2 is 94% on 2 L nasal cannula.  On physical exams, she still has noticeable auditory expiratory wheezing and minimal wheezing on auscultation.  Given her acute onset hypoxia, tachycardia and elevated d-dimer, there was concern for pulmonary embolism however CT angiography showed no evidence of pulmonary embolism.  Right-sided congestive heart failure also likely given her morbid obesity and possible OSA even though BNP was remarkable.  Acute coronary syndrome less likely and has been ruled out with troponin -Continue prednisone 50 mg daily x 3 days -Continue albuterol q4hrs prn wheezing -Follow-up echocardiogram -Follow-up respiratory viral panel -Supplemental oxygen PRN  #AKI vs CKD: sCr 1.3<<1.2 (Unknown baseline) -Avoid nephrotoxic meds -Follow up BMP  #Right knee pain likely 2/2 arthritis: She continues to endorse right knee pain this morning though not severe. On examination, the right knee is non-tender to palpation, there is no effusion or erythema. Prior to arrival, she had experienced right knee pain while lifting heavy objects. XR knee only revealed arthritic changes.  -Continue knee brace -Tylenol 650mg  PRN for pain, Tramadol 50mg  q12 prn severe pain  #HTN: BP stable this am at 130s/60s -Hold home Lisinopril-HCTZ 10-12.5mg    #Lactic acidosis 2/2 transient hypoxia vs hypoperfusion: LA 2.8<3.1 (1.6 pm arrival). BMP without evidence of metabolic acidosis  -Improved  -On IVF with NS @100mL /hr -Follow up LA in am   #Hx of insomnia/anxiety:  -Continue zolpidem 5mg  qhs PRN -Can resume Duloxetine 60mg  daily  #Incidental lymphadenopathy  CT angiography chest revealed 2.8 x 2.2 cm nodule in the anterior mediastinum. Recommending follow up with repeat chest CT in 3-6 months or further characterized with PET CT.  FEN: Regular diet, replace electrolyte as needed VTE ppx: Subcutaneous Lovenox CODE STATUS: Full code  Dispo: Anticipated discharge in approximately 1-2  day(s).   Yvette RackAgyei, Shaughnessy Gethers K, MD 07/28/2018, 6:39 AM Pager: [phone] [facility] PGY-1

## 2018-07-28 NOTE — ED Notes (Signed)
The pts 02 had been off for at least an hour   The pt did not miss it

## 2018-07-28 NOTE — ED Notes (Signed)
Delay in lab draw,  Pt currently in bathroom. 

## 2018-07-28 NOTE — ED Notes (Signed)
Pt  Moved to a recliner for comfort  Exertionally sob 2 puffs of albuterol and atrovent  Pt remains on 02 at 3

## 2018-07-28 NOTE — Discharge Summary (Signed)
Name: Janice Ryan MRN: [id number] DOB: [birth year] 63 y.o. PCP: [facility]  Date of Admission: 07/27/2018  6:18 PM Date of Discharge:  07/31/2018 Attending Physician: No att. providers found  Discharge Diagnosis: 1. Acute hypoxic respiratory failure 2/2 chronic asthma with exacerbation 2. Acute Kidney Injury  3. Right knee pain  4. Hypertension  6. Incidental Mediastinal Lymphadenopathy   Discharge Medications: Allergies as of 07/31/2018   No Known Allergies     Medication List    TAKE these medications   acetaminophen 500 MG tablet Commonly known as:  TYLENOL Take 1,000 mg by mouth every 6 (six) hours as needed for headache (pain).   ALPRAZolam 0.5 MG tablet Commonly known as:  XANAX Take 0.5 mg by mouth daily.   amphetamine-dextroamphetamine 10 MG tablet Commonly known as:  ADDERALL Take 10 mg by mouth 2 (two) times a day.   DULoxetine 60 MG capsule Commonly known as:  CYMBALTA Take 1 capsule (60 mg total) by mouth daily.   Fluticasone-Salmeterol 500-50 MCG/DOSE Aepb Commonly known as:  ADVAIR Inhale 1 puff into the lungs at bedtime.   gabapentin 300 MG capsule Commonly known as:  NEURONTIN Take 300 mg by mouth 3 (three) times daily.   ibuprofen 800 MG tablet Commonly known as:  ADVIL Take 1 tablet (800 mg total) by mouth every 8 (eight) hours as needed.   IRON PO Take 1 tablet by mouth every other day.   lisinopril-hydrochlorothiazide 10-12.5 MG tablet Commonly known as:  ZESTORETIC Take 1 tablet by mouth daily.   multivitamin with minerals Tabs tablet Take 1 tablet by mouth daily.   predniSONE 50 MG tablet Commonly known as:  DELTASONE Take 1 tablet (50 mg total) by mouth daily with breakfast. The morning after discharge   ProAir HFA 108 (90 Base) MCG/ACT inhaler Generic drug:  albuterol Inhale 2 puffs into the lungs every 6 (six) hours as needed for wheezing or shortness of breath.   traMADol 50 MG tablet Commonly  known as:  ULTRAM Take 2 tablets (100 mg total) by mouth every 6 (six) hours as needed for up to 3 days for moderate pain. What changed:    when to take this  reasons to take this   VITAMIN D3 PO Take 1 tablet by mouth daily.       Disposition and follow-up:   Janice Ryan was discharged from [facility] in Stable condition.  At the hospital follow up visit please address:  1. Acute hypoxic respiratory failure 2/2 chronic asthma with acute exacerbation: Ambulated three times on room air and required discharge with home supplemental O2, one more day of prednisone 50 mg for a total of 7 days. She will need work up for likely sleep apnea and obesity hypoventilation synrome. Continued home asthma medications AKI: acute kidney injury at admission, resolved with holding nephrotoxic medications. Her lisinopril and HCTZ were restarted at discharge.   Incidental lymphadenopathy:  CT angiography chest revealed 2.8 x 2.2 cm nodule in the anterior mediastinum. Recommending follow up with repeat chest CT in 3-6 months or further characterized with PET CT  2.  Labs / imaging needed at time of follow-up: BMP, CT 3-6 months  3.  Pending labs/ test needing follow-up: none  Follow-up Appointments: Follow-up Information    [facility] Follow up on 08/16/2018.   Why:  9:30 for hospital foolw up Contact information: [address] [place] [address] [phone]  [facility] Follow up.   Why:  oxygen, rolling walker       [facility] Follow [facility].   Specialty:  Home Health Services Why:  HHPT  Contact information: [address] [place] [id number] [phone]           Hospital Course by problem list: Acute hypoxic respiratory failure secondary to asthma exacerbation Janice Ryan is a 63 yo F w/ PMH of HTN,Morbid obesityand asthmawho presented with 2-week  history of worsening dyspnea and associated wheezing. She did report of subjective fever with T-max of 101.6 Fahrenheit. On arrival to the ED, she was hypoxic to the 80s on 9 L nasal cannula and received epinephrine. Oxygen saturation improved to 93% on 5 L nasal cannula and was slowly titrated down to 2 L. CT Angio was done in ED and did not show acute PE. Her symptoms significantly improved with IV Solu-Medrol, magnesium sulfate and albuterol inhaler. She was transitioned to oral prednisone 50mg , she received albuterol inhalers as needed for wheezing, and her home inhalers were continued. Due to concern for congestive heart failure, a transthoracic echocardiogram was performed and revealed moderate right ventricular enlargement and mild reduced function, otherwise wnl. She improved slowly but continued to require supplemental O2 on ambulation and was discharged with supplemental O2 as needed and one additional dose of prednisone for 7 days total. She has had a lot of shortness of breath recently and likely needs further workup for obstructive sleep apnea and obesity hypoventilation syndrome. She will follow-up with her PCP and was discharged with home health physical therapy and rolling walker.   2. Acute Kidney Injury  Patient presented with acute kidney injury vs. Stage III CKD, this was unclear as she had no previous labs for baseline. Nephrotoxic medications were held and during admission her AKI resolved. Her AKI was likely secondary to prerenal azotemia as she had been feeling ill, was dehydrated, and an improvement with IVF administration.   3. Right knee pain likely secondary to arthritis: Prior to arrival, she had experienced right knee pain while lifting heavy objects. XR knee only revealed arthritic changes. On examination, the right knee is non-tender to palpation, without effusion or erythema. She was managed with tylenol 650mg  PRN for pain and tramadol 50mg  as needed for severe pain. She had  difficulty ambulating with physical therapy due to pain. MRI was attempted but this was unable to be done due to her weight. She was discharged with three days tramadol and follow-up with PCP and home health PT.   4. Hypertension: Her blood pressure remained stable and her home Lisinopril-HCTZ 10-12.5mg  was held initially for AKI. Once this improved her lisinopril was restarted. She was also discharged with her HCTZ restarted and will need BMP at follow-up.   5. Lactic acidosis 2/2 transient hypoxia vs hypoperfusion: Her initial lactic acid was 1.6 with repeat of 3.1 and subsequently 2.8 after receiving IVF with normal saline. Repeat lactic acid was 1.1  7. Incidental lymphadenopathy: CT angiography chest revealed 2.8 x 2.2 cm nodule in the anterior mediastinum. Recommending follow up with repeat chest CT in 3-6 months or further characterized with PET CT.  Discharge Vitals:   BP 120/80 (BP Location: Right Arm)   Pulse 70   Temp 98.4 F (36.9 C) (Oral)   Resp 18   Ht 5' 7.5" (1.715 m)   Wt (!) 224.1 kg   SpO2 97%   BMI 76.23 kg/m   Pertinent Labs, Studies, and Procedures:  Echo 07/28/2018 IMPRESSIONS  1. The left ventricle has hyperdynamic systolic function, with an ejection fraction of >65%. The cavity size was normal. Left ventricular diastolic Doppler parameters are consistent with impaired relaxation.  2. The right ventricle has mildly reduced systolic function. The cavity was moderately enlarged. There is no increase in right ventricular wall thickness.  3. Aortic valve regurgitation was not assessed by color flow Doppler.  4. Pulmonic valve regurgitation was not assessed by color flow Doppler.  EXAM: CT ANGIOGRAPHY CHEST WITH CONTRAST  IMPRESSION: -No evidence of pulmonary embolus. -Cardiomegaly. -2.8 x 2.2 cm nodule in the anterior mediastinum. This could represent enlarged lymph node or thymic nodule. This could be followed with repeat chest CT in 3-6 months or further  characterized with PET CT.  07/27/2018 Chest XR IMPRESSION: Cardiomegaly.  No active disease. Electronically Signed   By: Charlett Nose M.D.   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 Electronically Signed   By: Jasmine Pang M.D.   On: 07/27/2018 20:30  Discharge Instructions: Discharge Instructions    Call MD for:  persistant dizziness or light-headedness   Complete by:  As directed    Call MD for:  persistant nausea and vomiting   Complete by:  As directed    Call MD for:  redness, tenderness, or signs of infection (pain, swelling, redness, odor or green/yellow discharge around incision site)   Complete by:  As directed    Call MD for:  temperature >100.4   Complete by:  As directed    Diet - low sodium heart healthy   Complete by:  As directed    Discharge instructions   Complete by:  As directed    Please take your last prednisone tablet the morning after your discharge to complete the treatment course.  Please call you primary care doctor to arrange an appointment with them in the next few weeks.  We have provided a three day supply of tramadol for an acute painful injury. I would also recommend taking Ibuprofen  three times daily scheduled for 7 days to assist with the inflammatory pain in the knee. A script for this has been provided.   Increase activity slowly   Complete by:  As directed       Signed: Versie Starks, DO 08/01/2018, 1:06 PM   Pager: [phone] IMTS PGY-1

## 2018-07-28 NOTE — ED Notes (Signed)
Report called to rn on 5 w 

## 2018-07-28 NOTE — ED Notes (Signed)
Pt in ct 

## 2018-07-29 ENCOUNTER — Other Ambulatory Visit: Payer: Self-pay

## 2018-07-29 DIAGNOSIS — N179 Acute kidney failure, unspecified: Secondary | ICD-10-CM

## 2018-07-29 DIAGNOSIS — M25561 Pain in right knee: Secondary | ICD-10-CM

## 2018-07-29 DIAGNOSIS — G8929 Other chronic pain: Secondary | ICD-10-CM

## 2018-07-29 LAB — BASIC METABOLIC PANEL
Anion gap: 10 (ref 5–15)
BUN: 17 mg/dL (ref 8–23)
CO2: 34 mmol/L — ABNORMAL HIGH (ref 22–32)
Calcium: 10.4 mg/dL — ABNORMAL HIGH (ref 8.9–10.3)
Chloride: 96 mmol/L — ABNORMAL LOW (ref 98–111)
Creatinine, Ser: 1.13 mg/dL — ABNORMAL HIGH (ref 0.44–1.00)
GFR calc Af Amer: >60 mL/min (ref >60)
GFR calc non Af Amer: 52 mL/min — ABNORMAL LOW (ref >60)
Glucose, Bld: 131 mg/dL — ABNORMAL HIGH (ref 70–99)
Potassium: 3.9 mmol/L (ref 3.5–5.1)
Sodium: 140 mmol/L (ref 135–145)

## 2018-07-29 LAB — LACTIC ACID, PLASMA: Lactic Acid, Venous: 1.1 mmol/L (ref 0.5–1.9)

## 2018-07-29 MED ORDER — LISINOPRIL 10 MG PO TABS
10.0000 mg | ORAL_TABLET | Freq: Every day | ORAL | Status: DC
  Administered 2018-07-29 – 2018-07-31 (×3): 10 mg via ORAL
  Filled 2018-07-29 (×3): qty 1

## 2018-07-29 NOTE — Plan of Care (Signed)

## 2018-07-29 NOTE — Progress Notes (Signed)
Pt with an elevated bp of 166/103. Pt was given scheduled lisinopril. BP was rechecked with a  Reading of 148/106.while pt had just finished ambulating. bp was rechecked again once patient rested for a little while and bp down to 116/77. Will continue to monitor pt.

## 2018-07-29 NOTE — Progress Notes (Addendum)
   Subjective: Her breathing is much better this morning. Notes wheezing has improved. She did not sleep well overnight due to the steroids. Her knee pain is unchanged, and she cannot really walk on it.  She has a PCP and in the past was supposed to be worked up for OSA.   Objective:  Vital signs in last 24 hours: Vitals:   07/28/18 0603 07/28/18 1408 07/28/18 2021 07/29/18 0441  BP: 138/68 (!) 157/68 (!) 147/85 (!) 154/117  Pulse: 90 82 90 81  Resp:  20 18 17   Temp:   98.5 F (36.9 C) 98.4 F (36.9 C)  TempSrc:   Oral Oral  SpO2:  96% 96% 93%   Constitution: NAD, sitting in chair, obese HENT: [place] in place  Cardio: RRR, no m/r/g  Respiratory: mild wheezing, good air movement, non-labored breathing, no rales or rhonchi  Neuro: a&o, normal affect  Skin: c/d/i   Assessment/Plan:  Active Problems:   Asthma with acute exacerbation   Hypoxia   SOB (shortness of breath)   Acute respiratory failure with hypoxia (HCC)  63 yo F w/ PMH of HTN,Morbid obesityand asthma who presented with 2-week history of worsening dyspnea and associated wheezing, hypoxia to the 80s on 9L [place]. She has improved significantly with epinephrine, steroids, and supplemental O2.   Acute Hypoxic Respiratory Failure 2/2 Asthma Exacerbation ECHO shows grade 1 diastolic dysfunction, troponins negative, RVP negative. Overall symptoms appear secondary to asthma exacerbation and symptoms are improving with treatment  - will need outpatient OSA workup - ambulate with/without oxygen pending PT evaluation - otherwise trial without O2 - continue prednisone 50 mg two days - albuterol q4h prn - supplemental O2 with RA O2 >= 92%  AKI on CKD Continuing to improve.    - restart lisinopril 10 mg    HTN Elevated blood pressure. Will restart lasix with improvement in GFR. Hold HCTZ for now.   Right Knee Pain States she is currently unable to walk on her knee. She has moved from bed to chair only.   - PT/OT pending   - may need MRI pending PT eval  Incidental Lymphadenopathy 2.8x2.2 cm nodule on anterior mediastinum. Will need f/u chest CT 3-6 months or PET CT.   VTE: none IVF: none Diet: regular Code: full   Dispo: Anticipated discharge today or tomorrow.   Guinevere Scarlet A, DO 07/29/2018, 6:06 AM Pager: [phone]

## 2018-07-29 NOTE — Progress Notes (Signed)
SATURATION QUALIFICATIONS: (This note is used to comply with regulatory documentation for home oxygen)  Patient Saturations on Room Air at Rest = 88%  Patient Saturations on Room Air while Ambulating = 87%  Patient Saturations on 2 Liters of oxygen while Ambulating = 93%  Please briefly explain why patient needs home oxygen: 

## 2018-07-29 NOTE — Evaluation (Addendum)
Occupational Therapy Evaluation Patient Details Name: Janice Ryan MRN: [id number] DOB: 1955/09/04 Today's Date: 07/29/2018    History of Present Illness Janice Ryan is a 63 yo F w/ PMH of HTN, Obesity and asthma presenting with shortness of breath with wheezing. Pt also with excrutiating R knee pain.   Clinical Impression   PTA patient reports independent and working.  Admitted for above and limited by problem list below, including decreased functional reach to B LEs, pain in R LE, and decreased activity tolerance.  Pt utilized RW throughout session for mobility with supervision, requires min assist for LB ADLs and supervision for grooming at sink. Patient will benefit from continued OT services while admitted to maximize independence and safety with ADLs with focus on tub transfers with R knee pain and LB AE training, but anticipate after dc no further needs necessary.      Follow Up Recommendations  No OT follow up;Supervision - Intermittent    Equipment Recommendations  None recommended by OT    Recommendations for Other Services       Precautions / Restrictions Precautions Precautions: Fall Precaution Comments: obsese, watch O2 Restrictions Weight Bearing Restrictions: No      Mobility Bed Mobility               General bed mobility comments: seated in chair upon arrival  Transfers Overall transfer level: Needs assistance Equipment used: Rolling walker (2 wheeled) Transfers: Sit to/from Stand Sit to Stand: Supervision         General transfer comment: supervision for safety, cueing for hand placement     Balance Overall balance assessment: Needs assistance Sitting-balance support: Feet supported;No upper extremity supported Sitting balance-Leahy Scale: Good     Standing balance support: Bilateral upper extremity supported Standing balance-Leahy Scale: Poor Standing balance comment: dependent on UB support, leaning on sink during  grooming tasks                            ADL either performed or assessed with clinical judgement   ADL Overall ADL's : Needs assistance/impaired     Grooming: Supervision/safety;Standing Grooming Details (indicate cue type and reason): leaning on sink for support, reports baseline  Upper Body Bathing: Set up;Sitting   Lower Body Bathing: Minimal assistance;Sit to/from stand Lower Body Bathing Details (indicate cue type and reason): decreased functional reach to B feet  Upper Body Dressing : Set up;Sitting   Lower Body Dressing: Minimal assistance;Sit to/from stand Lower Body Dressing Details (indicate cue type and reason): decreased functional reach to B LEs, supervision sit<>stand  Toilet Transfer: Supervision/safety;Ambulation;RW Toilet Transfer Details (indicate cue type and reason): simulated to/from recliner          Functional mobility during ADLs: Supervision/safety;Rolling walker General ADL Comments: limited by pain in R knee, body habitus, decreased act tolerance      Vision   Vision Assessment?: No apparent visual deficits     Perception     Praxis      Pertinent Vitals/Pain Pain Assessment: 0-10 Pain Score: 7  Pain Location: R knee Pain Descriptors / Indicators: Dull;Throbbing(deep) Pain Intervention(s): Repositioned     Hand Dominance Right   Extremity/Trunk Assessment Upper Extremity Assessment Upper Extremity Assessment: Overall WFL for tasks assessed   Lower Extremity Assessment Lower Extremity Assessment: Defer to PT evaluation RLE Deficits / Details: pt with reports of excruticaing pain in knee limiting ROM   Cervical / Trunk Assessment Cervical / Trunk Assessment: Normal  Communication Communication Communication: No difficulties   Cognition Arousal/Alertness: Awake/alert Behavior During Therapy: WFL for tasks assessed/performed Overall Cognitive Status: Within Functional Limits for tasks assessed                                  General Comments: tangental speech   General Comments  on supplemental oxgyen via [place] during session, 3L    Exercises     Shoulder Instructions      Home Living Family/patient expects to be discharged to:: Private residence Living Arrangements: Spouse/significant other Available Help at Discharge: Family;Available 24 hours/day Type of Home: Apartment Home Access: Stairs to enter Entrance Stairs-Number of Steps: 12 Entrance Stairs-Rails: Can reach both Home Layout: One level     Bathroom Shower/Tub: Chief Strategy OfficerTub/shower unit   Bathroom Toilet: Handicapped height     Home Equipment: Emergency planning/management officerhower seat;Walker - 2 wheels;Walker - 4 wheels;Wheelchair - manual          Prior Functioning/Environment Level of Independence: Independent        Comments: 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, psychology teacher, 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, driving         OT Problem List: Decreased activity tolerance;Decreased knowledge of use of DME or AE;Cardiopulmonary status limiting activity;Obesity      OT Treatment/Interventions: Self-care/ADL training;DME and/or AE instruction;Therapeutic exercise;Therapeutic activities;Patient/family education;Balance training    OT Goals(Current goals can be found in the care plan section) Acute Rehab OT Goals Patient Stated Goal: home soon OT Goal Formulation: With patient Time For Goal Achievement: 08/12/18 Potential to Achieve Goals: Good  OT Frequency: Min 2X/week   Barriers to D/C:            Co-evaluation              AM-PAC OT "6 Clicks" Daily Activity     Outcome Measure Help from another person eating meals?: None Help from another person taking care of personal grooming?: None Help from another person toileting, which includes using toliet, bedpan, or urinal?: None Help from another person bathing (including washing, rinsing, drying)?: A Little Help from another person to put on and taking off regular  upper body clothing?: None Help from another person to put on and taking off regular lower body clothing?: A Little 6 Click Score: 22   End of Session Equipment Utilized During Treatment: Rolling walker;Oxygen Nurse Communication: Mobility status  Activity Tolerance: Patient tolerated treatment well Patient left: in chair;with call bell/phone within reach  OT Visit Diagnosis: Other abnormalities of gait and mobility (R26.89)                Time: 6045-40981353-1409 OT Time Calculation (min): 16 min Charges:  OT General Charges $OT Visit: 1 Visit OT Evaluation $OT Eval Low Complexity: 1 Low  Chancy Milroyhristie S Rich Paprocki, OT Acute Rehabilitation Services Pager [phone] Office [phone]   Chancy MilroyChristie S Cheril Slattery 07/29/2018, 2:40 PM

## 2018-07-29 NOTE — TOC Initial Note (Signed)
Transition of Care [facility]) - Initial/Assessment Note    Patient Details  Name: Janice Ryan MRN: [id number] Date of Birth: November 18, 1955  [facility] CM/SW Contact:    Reola Mosher Phone Number: [phone] 07/29/2018, 10:00 AM  Clinical Narrative:                 Patient lives at home with spouse; [facility]; no medical insurance; Financial Counselor to see pt to determine what she might qualify for. CM will continue to follow for progression of care.  Expected Discharge Plan: Home/Self Care Barriers to Discharge: No Barriers Identified   Patient Goals and CMS Choice        Expected Discharge Plan and Services Expected Discharge Plan: Home/Self Care   Discharge Planning Services: CM Consult Post Acute Care Choice: NA                             HH Arranged: NA HH Agency: NA        Prior Living Arrangements/Services                       Activities of Daily Living      Permission Sought/Granted                  Emotional Assessment         Alcohol / Substance Use: Not Applicable Psych Involvement: No (comment)  Admission diagnosis:  Cough [R05] SOB (shortness of breath) [R06.02] Hypoxia [R09.02] Acute respiratory failure with hypoxia (HCC) [J96.01] Asthma with acute exacerbation, unspecified asthma severity, unspecified whether persistent [J45.901] Asthma exacerbation [J45.901] Patient Active Problem List   Diagnosis Date Noted  . Acute pain of right knee   . Asthma with acute exacerbation 07/28/2018  . Hypoxia   . SOB (shortness of breath)   . Acute respiratory failure with hypoxia [facility])    PCP:  [facility] Pharmacy:   [facility] [place], [place] - [address] [place] [address] Phone: [phone] Fax: [phone]     Social Determinants of Health (SDOH) Interventions    Readmission Risk  Interventions No flowsheet data found.

## 2018-07-29 NOTE — Evaluation (Signed)
Physical Therapy Evaluation Patient Details Name: Janice Ryan MRN: [id number] DOB: Sep 08, 1955 Today's Date: 07/29/2018   History of Present Illness  Ms.Boykin-Jefferies is a 63 yo F w/ PMH of HTN, Obesity and asthma presenting with shortness of breath with wheezing. Pt also with excrutiating R knee pain.  Clinical Impression  Pt admitted with above. Pt's primary concern is her R knee pain, "I can not stand it, it's almost worse than child birth." Recommend imaging as pt reports "I heard a pop when I injured it." Informed patient it may be an outpatient appt versus in the hospital. Pt amb 50' with RW requiring 1 min standing rest 1/2 through. See SpO2 below. Pt unsafe to return home this date as pt unable to ambulate 74' without significant SOB and she has a full flight of stairs to enter her apartment. Recommend another night stay to allow for her breathing to improve. Pt wasn't on SpO2 PTA but is now requiring 2lO2 via [place]. Pt unable to access her home at this time. SATURATION QUALIFICATIONS: (This note is used to comply with regulatory documentation for home oxygen)  Patient Saturations on Room Air at Rest = 90%  Patient Saturations on Room Air while Ambulating = 82% s/p 20'  Patient Saturations on 2 Liters of oxygen while Ambulating = 89%  Please briefly explain why patient needs home oxygen: unable  Maintain SpO2 >88% on RA.    Follow Up Recommendations No PT follow up;Supervision/Assistance - 24 hour (if patient con't to demo deconditioning, may need HHPT)    Equipment Recommendations  Rolling walker with 5" wheels(barriatric)    Recommendations for Other Services       Precautions / Restrictions Precautions Precautions: Fall Precaution Comments: obsese, watch O2 Restrictions Weight Bearing Restrictions: No(self WBAT on R knee due to pain)      Mobility  Bed Mobility               General bed mobility comments: pt up in chair upon PT  arrival  Transfers Overall transfer level: Needs assistance Equipment used: Rolling walker (2 wheeled) Transfers: Sit to/from Stand Sit to Stand: Supervision         General transfer comment: used momemntum to rock forward, v/c's to push up from chair  Ambulation/Gait Ambulation/Gait assistance: Min guard Gait Distance (Feet): 50 Feet Assistive device: Rolling walker (2 wheeled)(wide, barriatric) Gait Pattern/deviations: Step-to pattern;Decreased weight shift to right Gait velocity: dec Gait velocity interpretation: <1.31 ft/sec, indicative of household ambulator General Gait Details: antalgic, dependent on bilat UE for support due to R LE pain, SpO2 dec to 82% on RA, 90% on 2lO2 via [place]  Stairs            Wheelchair Mobility    Modified Rankin (Stroke Patients Only)       Balance Overall balance assessment: Needs assistance Sitting-balance support: Feet supported;No upper extremity supported Sitting balance-Leahy Scale: Good     Standing balance support: Bilateral upper extremity supported Standing balance-Leahy Scale: Poor Standing balance comment: dependent on bilatUE on RW                             Pertinent Vitals/Pain Pain Assessment: 0-10 Pain Score: 7  Pain Location: R knee Pain Descriptors / Indicators: Dull    Home Living Family/patient expects to be discharged to:: Private residence Living Arrangements: Spouse/significant other Available Help at Discharge: Family;Available PRN/intermittently(can have 24/7 if needed) Type of Home: Apartment Home Access: Stairs  to enter Entrance Stairs-Rails: Can reach both Entrance Stairs-Number of Steps: 12 Home Layout: One level Home Equipment: Emergency planning/management officerhower seat;Walker - 2 wheels;Walker - 4 wheels;Wheelchair - manual      Prior Function Level of Independence: Independent         Comments: 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     Hand Dominance   Dominant Hand: Right     Extremity/Trunk Assessment   Upper Extremity Assessment Upper Extremity Assessment: Overall WFL for tasks assessed    Lower Extremity Assessment Lower Extremity Assessment: RLE deficits/detail RLE Deficits / Details: pt with reports of excruticaing pain in knee limiting ROM    Cervical / Trunk Assessment Cervical / Trunk Assessment: Normal  Communication   Communication: No difficulties  Cognition Arousal/Alertness: Awake/alert Behavior During Therapy: WFL for tasks assessed/performed Overall Cognitive Status: Within Functional Limits for tasks assessed                                 General Comments: tangental speech      General Comments General comments (skin integrity, edema, etc.): see O2 states    Exercises     Assessment/Plan    PT Assessment Patient needs continued PT services  PT Problem List Decreased strength;Decreased mobility;Decreased balance;Decreased activity tolerance       PT Treatment Interventions DME instruction;Gait training;Stair training;Functional mobility training;Therapeutic activities;Therapeutic exercise    PT Goals (Current goals can be found in the Care Plan section)  Acute Rehab PT Goals Patient Stated Goal: home soon PT Goal Formulation: With patient Time For Goal Achievement: 08/12/18 Potential to Achieve Goals: Good    Frequency Min 3X/week   Barriers to discharge        Co-evaluation               AM-PAC PT "6 Clicks" Mobility  Outcome Measure Help needed turning from your back to your side while in a flat bed without using bedrails?: A Little Help needed moving from lying on your back to sitting on the side of a flat bed without using bedrails?: A Little Help needed moving to and from a bed to a chair (including a wheelchair)?: A Little Help needed standing up from a chair using your arms (e.g., wheelchair or bedside chair)?: A Little Help needed to walk in hospital room?: A Little Help needed  climbing 3-5 steps with a railing? : A Lot 6 Click Score: 17    End of Session Equipment Utilized During Treatment: Oxygen(2LO2 via [place]) Activity Tolerance: Patient tolerated treatment well Patient left: in chair;with call bell/phone within reach Nurse Communication: Mobility status(R knee pain) PT Visit Diagnosis: Unsteadiness on feet (R26.81);Difficulty in walking, not elsewhere classified (R26.2)    Time: 6962-95281320-1345 PT Time Calculation (min) (ACUTE ONLY): 25 min   Charges:   PT Evaluation $PT Eval Moderate Complexity: 1 Mod PT Treatments $Gait Training: 8-22 mins        Lewis ShockAshly Menachem Urbanek, PT, DPT Acute Rehabilitation Services Pager #: [phone] Office #: [phone]   Iona Hansenshly M Emiline Mancebo 07/29/2018, 1:50 PM

## 2018-07-29 NOTE — Progress Notes (Signed)
Spoke with provider who wanted nurse to wean pt off oxygen. Provider wanted pt oxygen sats on RA sitting as well as with ambulation. Pt oxygen removed and O2 sat dropped to 88%. Pt was placed on 1L and O2 moved up to 90%. PT was placed on 2L and oxygen level moved up to 93% at rest. Will continue to monitor pt. Pt oxygen sats checked while ambulating with PT. Pt's oxygen saturation dropped to 87% on RA. Pt oxygen saturation moved up to 94% on 2L . Will continue to monitor pt and inform provider.

## 2018-07-30 ENCOUNTER — Inpatient Hospital Stay (HOSPITAL_COMMUNITY): Payer: Self-pay

## 2018-07-30 LAB — BASIC METABOLIC PANEL
Anion gap: 10 (ref 5–15)
BUN: 24 mg/dL — ABNORMAL HIGH (ref 8–23)
CO2: 30 mmol/L (ref 22–32)
Calcium: 10 mg/dL (ref 8.9–10.3)
Chloride: 98 mmol/L (ref 98–111)
Creatinine, Ser: 1.11 mg/dL — ABNORMAL HIGH (ref 0.44–1.00)
GFR calc Af Amer: >60 mL/min (ref >60)
GFR calc non Af Amer: 54 mL/min — ABNORMAL LOW (ref >60)
Glucose, Bld: 86 mg/dL (ref 70–99)
Potassium: 4.4 mmol/L (ref 3.5–5.1)
Sodium: 138 mmol/L (ref 135–145)

## 2018-07-30 MED ORDER — MOMETASONE FURO-FORMOTEROL FUM 200-5 MCG/ACT IN AERO
2.0000 | INHALATION_SPRAY | Freq: Two times a day (BID) | RESPIRATORY_TRACT | Status: DC
  Administered 2018-07-31: 2 via RESPIRATORY_TRACT
  Filled 2018-07-30: qty 8.8

## 2018-07-30 MED ORDER — VITAMIN D 25 MCG (1000 UNIT) PO TABS
1000.0000 [IU] | ORAL_TABLET | Freq: Every day | ORAL | Status: DC
  Administered 2018-07-30 – 2018-07-31 (×2): 1000 [IU] via ORAL
  Filled 2018-07-30 (×2): qty 1

## 2018-07-30 MED ORDER — IPRATROPIUM-ALBUTEROL 0.5-2.5 (3) MG/3ML IN SOLN
3.0000 mL | Freq: Two times a day (BID) | RESPIRATORY_TRACT | Status: DC
  Administered 2018-07-30 – 2018-07-31 (×2): 3 mL via RESPIRATORY_TRACT
  Filled 2018-07-30 (×2): qty 3

## 2018-07-30 MED ORDER — IPRATROPIUM-ALBUTEROL 0.5-2.5 (3) MG/3ML IN SOLN
3.0000 mL | Freq: Four times a day (QID) | RESPIRATORY_TRACT | Status: DC
  Administered 2018-07-30: 3 mL via RESPIRATORY_TRACT
  Filled 2018-07-30: qty 3

## 2018-07-30 MED ORDER — ENOXAPARIN SODIUM 120 MG/0.8ML ~~LOC~~ SOLN
110.0000 mg | SUBCUTANEOUS | Status: DC
  Administered 2018-07-31: 110 mg via SUBCUTANEOUS
  Filled 2018-07-30: qty 0.73

## 2018-07-30 MED ORDER — ALPRAZOLAM 0.5 MG PO TABS: 0.5000 mg | ORAL_TABLET | Freq: Every day | ORAL | Status: DC

## 2018-07-30 NOTE — Progress Notes (Signed)
Pt brought to MRI for scan of right knee.  Pt moved to table and attempted to put in scanner.  Due to pt's size, she was very tight and we were unable to get pt fully in scanner.  She refused to try any further and wanted to be taken back to her room.  MRI not done at this time and may not be able to be done due to pt's size.

## 2018-07-30 NOTE — Progress Notes (Addendum)
Occupational Therapy Treatment Patient Details Name: Janice Ryan MRN: [id number] DOB: 04/27/1955 Today's Date: 07/30/2018    History of present illness Janice Ryan is a 63 yo F w/ PMH of HTN, Obesity and asthma presenting with shortness of breath with wheezing. Pt also with excrutiating R knee pain.   OT comments  Patient seated in recliner, handoff from PT.  Patient educated on AE for LB ADLs, return demonstrated use of reacher and sock aide to don/doff socks with min assist (limited due to body habitus); verbally educated on use of long sponge (which pt reports owning at home).  She will benefit from further AE training, reports plan to purchase after dc.  Reviewed energy conservation techniques briefly, and provided handout for pt to review.  Cueing for transfer technique with supervision today, using RW and wearing brace to R knee. Provided HEP. Will follow while admitted.     Follow Up Recommendations  No OT follow up;Supervision - Intermittent    Equipment Recommendations  None recommended by OT    Recommendations for Other Services      Precautions / Restrictions Precautions Precautions: Fall Precaution Comments: obsese, watch O2 Restrictions Weight Bearing Restrictions: No       Mobility Bed Mobility Overal bed mobility: Modified Independent             General bed mobility comments: use of rail and increased time and effort needed  Transfers Overall transfer level: Needs assistance Equipment used: Rolling walker (2 wheeled) Transfers: Sit to/from Stand Sit to Stand: Supervision         General transfer comment: supervision for safety, cueing for hand placement     Balance Overall balance assessment: Needs assistance Sitting-balance support: Feet supported;No upper extremity supported Sitting balance-Leahy Scale: Good     Standing balance support: Bilateral upper extremity supported Standing balance-Leahy Scale: Poor Standing balance  comment: dependent on bilatUE on RW                           ADL either performed or assessed with clinical judgement   ADL Overall ADL's : Needs assistance/impaired             Lower Body Bathing: Minimal assistance;Sit to/from stand Lower Body Bathing Details (indicate cue type and reason): reviewed use of long sponge for bathing seated, energy conservation     Lower Body Dressing: Minimal assistance;Sit to/from stand;With adaptive equipment Lower Body Dressing Details (indicate cue type and reason): pt return demonstrated use of AE for LB dressing with min assist, increased time and effort  Toilet Transfer: Supervision/safety;Ambulation;RW Toilet Transfer Details (indicate cue type and reason): simulated to/from recliner          Functional mobility during ADLs: Supervision/safety;Rolling walker General ADL Comments: limited by pain in R knee, body habitus, decreased act tolerance      Vision       Perception     Praxis      Cognition Arousal/Alertness: Awake/alert Behavior During Therapy: WFL for tasks assessed/performed Overall Cognitive Status: Within Functional Limits for tasks assessed                                 General Comments: tangental speech        Exercises Exercises: General Upper Extremity General Exercises - Upper Extremity Shoulder Flexion: Strengthening;5 reps;Both;Theraband;Seated Theraband Level (Shoulder Flexion): Level 3 (Green) Shoulder Extension: Strengthening;Both;5 reps;Seated;Theraband Theraband Level (Shoulder  Extension): Level 3 (Green) Shoulder ABduction: Strengthening;Both;5 reps;Seated;Theraband Theraband Level (Shoulder Abduction): Level 3 (Green) Shoulder ADduction: Strengthening;Both;5 reps;Seated;Theraband Theraband Level (Shoulder Adduction): Level 3 (Green) Elbow Flexion: Strengthening;Both;5 reps;Seated;Theraband Theraband Level (Elbow Flexion): Level 3 (Green) Elbow Extension:  Strengthening;Both;5 reps;Seated;Theraband Theraband Level (Elbow Extension): Level 3 (Green)   Shoulder Instructions       General Comments on supplemental oxgyen via [place], 2L    Pertinent Vitals/ Pain       Pain Assessment: Faces Faces Pain Scale: Hurts little more Pain Location: R knee Pain Descriptors / Indicators: Dull;Aching Pain Intervention(s): Limited activity within patient's tolerance  Home Living                                          Prior Functioning/Environment              Frequency  Min 2X/week        Progress Toward Goals  OT Goals(current goals can now be found in the care plan section)  Progress towards OT goals: Progressing toward goals  Acute Rehab OT Goals Patient Stated Goal: home soon OT Goal Formulation: With patient Time For Goal Achievement: 08/12/18 Potential to Achieve Goals: Good  Plan Discharge plan remains appropriate;Frequency remains appropriate    Co-evaluation                 AM-PAC OT "6 Clicks" Daily Activity     Outcome Measure   Help from another person eating meals?: None Help from another person taking care of personal grooming?: None Help from another person toileting, which includes using toliet, bedpan, or urinal?: None Help from another person bathing (including washing, rinsing, drying)?: A Little Help from another person to put on and taking off regular upper body clothing?: None Help from another person to put on and taking off regular lower body clothing?: A Little 6 Click Score: 22    End of Session Equipment Utilized During Treatment: Rolling walker;Oxygen  OT Visit Diagnosis: Other abnormalities of gait and mobility (R26.89)   Activity Tolerance Patient tolerated treatment well   Patient Left in chair;with call bell/phone within reach   Nurse Communication Mobility status        Time: 3734-2876 OT Time Calculation (min): 18 min  Charges: OT General Charges $OT  Visit: 1 Visit OT Treatments $Self Care/Home Management : 8-22 mins  Chancy Milroy, OT Acute Rehabilitation Services Pager [phone] Office [phone]    Chancy Milroy 07/30/2018, 3:44 PM

## 2018-07-30 NOTE — Progress Notes (Signed)
Spoke with RN Sharyl Nimrod and the patient at this time no longer needs a PIV

## 2018-07-30 NOTE — Progress Notes (Signed)
Physical Therapy Treatment Patient Details Name: Janice Ryan MRN: [id number] DOB: 05/15/1955 Today's Date: 07/30/2018    History of Present Illness Janice Ryan is a 63 yo F w/ PMH of HTN, Obesity and asthma presenting with shortness of breath with wheezing. Pt also with excrutiating R knee pain.    PT Comments    Patient seen for mobility progression. Pt tolerates short gait distance on 60 ft with RW and supervision for safety. Cues for PLB throughout session due to SOB and SpO2 desat on RA. 2L O2 via [place] required to maintain SpO2 90% or > (see saturation qualification note). Recommend HHPT for further skilled PT services to maximize independence and safety with mobility.    Follow Up Recommendations  Supervision/Assistance - 24 hour;Home health PT     Equipment Recommendations  Rolling walker with 5" wheels(bariatric/wide)    Recommendations for Other Services       Precautions / Restrictions Precautions Precautions: Fall Precaution Comments: obsese, watch O2 Restrictions Weight Bearing Restrictions: No(self WBAT on R knee due to pain)    Mobility  Bed Mobility Overal bed mobility: Modified Independent             General bed mobility comments: use of rail and increased time and effort needed  Transfers Overall transfer level: Needs assistance Equipment used: Rolling walker (2 wheeled) Transfers: Sit to/from Stand Sit to Stand: Supervision         General transfer comment: supervision for safety, cueing for hand placement   Ambulation/Gait Ambulation/Gait assistance: Min guard Gait Distance (Feet): 60 Feet Assistive device: Rolling walker (2 wheeled)(wide, barriatric) Gait Pattern/deviations: Step-to pattern;Decreased weight shift to right;Wide base of support;Trunk flexed Gait velocity: dec   General Gait Details: heavy reliance on bilat UE support; cues for PLB throughout   Stairs             Wheelchair Mobility    Modified  Rankin (Stroke Patients Only)       Balance Overall balance assessment: Needs assistance Sitting-balance support: Feet supported;No upper extremity supported Sitting balance-Leahy Scale: Good     Standing balance support: Bilateral upper extremity supported Standing balance-Leahy Scale: Poor Standing balance comment: dependent on bilatUE on RW                            Cognition Arousal/Alertness: Awake/alert Behavior During Therapy: WFL for tasks assessed/performed Overall Cognitive Status: Within Functional Limits for tasks assessed                                 General Comments: tangental speech      Exercises      General Comments General comments (skin integrity, edema, etc.): SpO2 desat to 84% on RA with mobility      Pertinent Vitals/Pain Pain Assessment: Faces Faces Pain Scale: Hurts little more Pain Location: R knee Pain Descriptors / Indicators: Dull;Aching Pain Intervention(s): Limited activity within patient's tolerance;Monitored during session;Repositioned    Home Living                      Prior Function            PT Goals (current goals can now be found in the care plan section) Acute Rehab PT Goals Patient Stated Goal: home soon Progress towards PT goals: Progressing toward goals    Frequency    Min 3X/week  PT Plan Discharge plan needs to be updated    Co-evaluation              AM-PAC PT "6 Clicks" Mobility   Outcome Measure  Help needed turning from your back to your side while in a flat bed without using bedrails?: A Little Help needed moving from lying on your back to sitting on the side of a flat bed without using bedrails?: A Little Help needed moving to and from a bed to a chair (including a wheelchair)?: A Little Help needed standing up from a chair using your arms (e.g., wheelchair or bedside chair)?: A Little Help needed to walk in hospital room?: A Little Help needed  climbing 3-5 steps with a railing? : A Lot 6 Click Score: 17    End of Session Equipment Utilized During Treatment: Oxygen(2LO2 via ) Activity Tolerance: Patient tolerated treatment well Patient left: in chair;with call bell/phone within reach;Other (comment)(OT present) Nurse Communication: Mobility status(R knee pain) PT Visit Diagnosis: Unsteadiness on feet (R26.81);Difficulty in walking, not elsewhere classified (R26.2)     Time: 1624-4695 PT Time Calculation (min) (ACUTE ONLY): 33 min  Charges:  $Gait Training: 8-22 mins $Therapeutic Activity: 8-22 mins                     Erline Levine, PTA Acute Rehabilitation Services Pager: [phone] Office: [phone]     Carolynne Edouard 07/30/2018, 1:26 PM

## 2018-07-30 NOTE — Progress Notes (Signed)
SATURATION QUALIFICATIONS: (This note is used to comply with regulatory documentation for home oxygen)  Patient Saturations on Room Air at Rest = 87%  Patient Saturations on Room Air while Ambulating = 84%  Patient Saturations on 2 Liters of oxygen while Ambulating = 90%  Please briefly explain why patient needs home oxygen: Pt is unable to maintain O2 saturation 90% or > on without supplemental O2.  Erline Levine, PTA Acute Rehabilitation Services Pager: [phone] Office: [phone]

## 2018-07-30 NOTE — Plan of Care (Signed)

## 2018-07-30 NOTE — Progress Notes (Addendum)
   Subjective:  States she is feeling well this morning and that SOB continues to improve. She did feel a bit panicked when her oxygen was disconnected when trying to obtain MRI earlier today. She states she has difficulty ambulating and has pain in her knee when walking but believes she can make it up her stairs at home as she was able to walk with walker yesterday and is agreeable to outpatient follow-up for this.  She denies chest pain, dizziness, or cough.   Objective:  Vital signs in last 24 hours: Vitals:   07/29/18 1430 07/29/18 1500 07/29/18 2153 07/30/18 0601  BP: 116/77  134/61 (!) 133/57  Pulse: 92  86 65  Resp: 20     Temp:   98.1 F (36.7 C) 98.4 F (36.9 C)  TempSrc:   Oral Oral  SpO2: 93%  93% 92%  Weight:      Height:  5' 7.5" (1.715 m)     Constitution: NAD, supine in bed, obese HENT: [place], on 3L [place] Cardio: RRR, no m/r/g  Respiratory: mild wheezing, good air movement, no rales or rhonchi  MSK: no edema, moving all extremities, no erythema or warmth over right knee Neuro: a&o, normal affect  Skin: c/d/i    Assessment/Plan:  Active Problems:   Asthma with acute exacerbation   Hypoxia   SOB (shortness of breath)   Acute respiratory failure with hypoxia (HCC)   Acute pain of right knee  63 yo F w/ PMH of HTN,Morbid obesityand asthmawho presented with 2-week history of worsening dyspnea and associated wheezing, hypoxia to the 80s on 9L [place]. She has improved significantly with epinephrine, steroids, and supplemental O2.   Acute Hypoxic Respiratory Failure 2/2 Asthma Exacerbation Continues to have mild wheezing and desaturation on ambulation. Uses advair 500-50 at home. Ambulating today but continues to desaturate on room air at rest and with ambulation. 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   - cont. PT/OT - continue  prednisone - may need >5 days  - add duonebulizer q6h  - add dulera  - will need outpatient sleep study   AKI Previously unable to clinically determine AKI from CKD. Now resolved  HTN Controlled. Holding home HCTZ for recent AKI.   - cont. Home lisinopril   Right Knee Pain Unable to obtain MRI due to patient size. She is agreeable to obtaining this in the outpatient setting and would prefer outpt follow-up.   - cont. Home PT/OT - home walker   VTE: lovenox IVF: none Diet: regular Code: full   Dispo: Anticipated discharge in approximately tomorrow.   Versie Starks, DO 07/30/2018, 6:31 AM Pager: [phone]

## 2018-07-31 DIAGNOSIS — Z6841 Body Mass Index (BMI) 40.0 and over, adult: Secondary | ICD-10-CM

## 2018-07-31 LAB — BASIC METABOLIC PANEL
Anion gap: 9 (ref 5–15)
BUN: 23 mg/dL (ref 8–23)
CO2: 32 mmol/L (ref 22–32)
Calcium: 9.9 mg/dL (ref 8.9–10.3)
Chloride: 99 mmol/L (ref 98–111)
Creatinine, Ser: 0.96 mg/dL (ref 0.44–1.00)
GFR calc Af Amer: >60 mL/min (ref >60)
GFR calc non Af Amer: >60 mL/min (ref >60)
Glucose, Bld: 98 mg/dL (ref 70–99)
Potassium: 3.9 mmol/L (ref 3.5–5.1)
Sodium: 140 mmol/L (ref 135–145)

## 2018-07-31 MED ORDER — DULOXETINE HCL 60 MG PO CPEP: 60.0000 mg | ORAL_CAPSULE | Freq: Every day | ORAL | 0 refills | Status: DC

## 2018-07-31 MED ORDER — PREDNISONE 50 MG PO TABS: 50.0000 mg | ORAL_TABLET | Freq: Every day | ORAL | 0 refills | Status: DC

## 2018-07-31 MED ORDER — TRAMADOL HCL 50 MG PO TABS: 100.0000 mg | ORAL_TABLET | Freq: Four times a day (QID) | ORAL | 0 refills | Status: DC | PRN

## 2018-07-31 MED ORDER — FLUTICASONE PROPIONATE 50 MCG/ACT NA SUSP
2.0000 | Freq: Every day | NASAL | Status: DC
  Administered 2018-07-31: 11:00:00 2 via NASAL
  Filled 2018-07-31: qty 16

## 2018-07-31 MED ORDER — PREDNISONE 50 MG PO TABS
50.0000 mg | ORAL_TABLET | Freq: Every day | ORAL | Status: DC
  Administered 2018-07-31: 08:00:00 50 mg via ORAL
  Filled 2018-07-31: qty 1

## 2018-07-31 MED ORDER — TRAMADOL HCL 50 MG PO TABS: 100.0000 mg | ORAL_TABLET | Freq: Four times a day (QID) | ORAL | 0 refills | Status: AC | PRN

## 2018-07-31 MED ORDER — IBUPROFEN 800 MG PO TABS: 800.0000 mg | ORAL_TABLET | Freq: Three times a day (TID) | ORAL | 0 refills | Status: DC | PRN

## 2018-07-31 MED FILL — predniSONE 50 MG TABS: 50 | 1 days supply | Qty: 1 | Fill #0

## 2018-07-31 MED FILL — traMADol HCL 50 MG TABS: 50 | 3 days supply | Qty: 12 | Fill #0

## 2018-07-31 MED FILL — IBUPROFEN 800 MG TAB: 800 | 7 days supply | Qty: 21 | Fill #0

## 2018-07-31 MED FILL — DULoxetine HCL 60 MG CPEP: 60 | 30 days supply | Qty: 30 | Fill #0

## 2018-07-31 NOTE — Progress Notes (Signed)
Educated pt in tub transfers with RW and reinforced energy conservation strategies. Pt eager to go home.   07/31/18 1100  OT Visit Information  Last OT Received On 07/31/18  Assistance Needed +1  History of Present Illness Ms.Boykin-Jefferies is a 63 yo F w/ PMH of HTN, Obesity and asthma presenting with shortness of breath with wheezing. Pt also with excrutiating R knee pain.  Precautions  Precautions Fall  Precaution Comments obsese, watch O2  Pain Assessment  Pain Assessment Faces  Faces Pain Scale 4  Pain Location R knee  Pain Descriptors / Indicators Discomfort;Constant  Pain Intervention(s) Premedicated before session  Cognition  Arousal/Alertness Awake/alert  Behavior During Therapy WFL for tasks assessed/performed  Overall Cognitive Status Within Functional Limits for tasks assessed  General Comments initially sleepy, pt stating she had pain meds earlier  ADL  Grooming Wash/dry hands;Wash/dry face;Sitting;Set up  Grooming Details (indicate cue type and reason) to assist pt to wake up  Tub/Shower Transfer Details (indicate cue type and reason) Educated pt in tub transfer technique going backwards with RW, good leg in first and out last  General ADL Comments Pt able to recall energy conservation and demonstrated pursed lip breathing techniques.  Bed Mobility  Overal bed mobility Modified Independent  General bed mobility comments use of rail and increased time and use of momentum  Balance  Sitting balance-Leahy Scale Good  Restrictions  Weight Bearing Restrictions No  Transfers  General transfer comment pt declining OOB due to grogginess  OT - End of Session  Equipment Utilized During Treatment Oxygen (pt asking for her 02 at end of session)  Activity Tolerance Patient tolerated treatment well  Patient left in bed;with call bell/phone within reach  OT Assessment/Plan  OT Plan Discharge plan remains appropriate;Frequency remains appropriate  OT Visit Diagnosis Other  abnormalities of gait and mobility (R26.89)  OT Frequency (ACUTE ONLY) Min 2X/week  Follow Up Recommendations No OT follow up;Supervision - Intermittent  OT Equipment None recommended by OT  AM-PAC OT "6 Clicks" Daily Activity Outcome Measure (Version 2)  Help from another person eating meals? 4  Help from another person taking care of personal grooming? 4  Help from another person toileting, which includes using toliet, bedpan, or urinal? 4  Help from another person bathing (including washing, rinsing, drying)? 3  Help from another person to put on and taking off regular upper body clothing? 4  Help from another person to put on and taking off regular lower body clothing? 3  6 Click Score 22  OT Goal Progression  Progress towards OT goals Progressing toward goals  Acute Rehab OT Goals  Patient Stated Goal home soon  OT Goal Formulation With patient  Time For Goal Achievement 08/12/18  Potential to Achieve Goals Good  OT Time Calculation  OT Start Time (ACUTE ONLY) 1120  OT Stop Time (ACUTE ONLY) 1141  OT Time Calculation (min) 21 min  OT General Charges  $OT Visit 1 Visit  OT Treatments  $Self Care/Home Management  8-22 mins  Martie Round, OTR/L Acute Rehabilitation Services Pager: [phone] Office: [phone]

## 2018-07-31 NOTE — Progress Notes (Addendum)
   Subjective:  She is feeling well today and is hopeful that she can go home. She feels that her breathing continues to improve. She had some SOB when she first had her [place] removed but feels better now. She does have some wheezing still. She does not have pain in her knee unless she bears weight on it. She denies cough or chest pain.   Objective:  Vital signs in last 24 hours: Vitals:   07/31/18 0534 07/31/18 0814 07/31/18 0827 07/31/18 1254  BP: 125/69 112/68  120/80  Pulse: 65  74 70  Resp:   16 18  Temp: 98.4 F (36.9 C)   98.4 F (36.9 C)  TempSrc: Oral   Oral  SpO2: 93%  92% 97%  Weight:      Height:       Constitution: NAD, obese, sitting up in chair HENT: [place]/AT Cardio: RRR, no m/r/g  Respiratory: mild wheezing bilaterally, good air movement Neuro: a&o, normal affect Skin: c/d/i   Assessment/Plan:  Active Problems:   Asthma, chronic obstructive, with acute exacerbation (HCC)   Hypoxia   SOB (shortness of breath)   Acute respiratory failure with hypoxia (HCC)   Acute pain of right knee  Acute Hypoxic Respiratory Failure 2/2 Chronic Asthma with Exacerbation  Continues to require supplemental O2 on ambulation as she desaturated to 87% while ambulating despite treatment of her acute asthma exacerbation. Saturates well on RA and wheezing has improved. Symptoms likely have component of OSA and/or OHS. Will continue prednisone for a total of 7 days total.   - cont. PT/OT - home supplemental O2 ordered - will give 7 days prednisone 50mg  total  - will need outpatient sleep study  - cont. Dulera, duonebulizer q6h   Right Knee Pain Continues to have pain when she tries to ambulate. She was unable to complete MRI during admission 2/2 anxiety and weight   - Home PT ordered  - home walker  - f/u with PCP to reassess knee and have MRI or CT  HTN  - cont. Home blood pressure medications  VTE: lovenox IVF: none Diet: regular  Code: full   Dispo: Anticipated discharge  in approximately today.   Adrianne Shackleton A, DO 07/31/2018, 1:30 PM Pager: [phone]

## 2018-07-31 NOTE — TOC Transition Note (Addendum)
Transition of Care [facility]) - CM/SW Discharge Note   Patient Details  Name: Aela Dryden MRN: [id number] Date of Birth: 1955/11/18  [facility]) CM/SW Contact:  Leone Haven, RN Phone Number: 07/31/2018, 12:34 PM   Clinical Narrative:    From home with spouse, for discharge today, will need hospital follow up, HHPT, Medication assist with [employer] and [facility] pharmacy.  Patient states she has transportation at dc.  NCM contacted Zack with [employer] for home oxygen and rolling walker , and contacted Kandee Keen with [facility] for charity HHPT, and contacted [facility] pharmacy and MD for medications.  [employer] letter done.   Final next level of care: Home/Self Care Barriers to Discharge: No Barriers Identified   Patient Goals and CMS Choice Patient states their goals for this hospitalization and ongoing recovery are:: to go back to work   Choice offered to / list presented to : Patient  Discharge Placement                       Discharge Plan and Services   Discharge Planning Services: CM Consult Post Acute Care Choice: Home Health          DME Arranged: Oxygen DME Agency: [employer] Date DME Agency Contacted: 07/31/18 Time DME Agency Contacted: 1155 Representative spoke with at DME Agency: zack HH Arranged: PT HH Agency: [facility] Date [facility] Agency Contacted: 07/31/18 Time HH Agency Contacted: 1226 Representative spoke with at [facility] Agency: cory  Social Determinants of Health (SDOH) Interventions     Readmission Risk Interventions Readmission Risk Prevention Plan 07/31/2018  Post Dischage Appt Complete  Medication Screening Complete  Transportation Screening Complete  Some recent data might be hidden

## 2018-07-31 NOTE — Progress Notes (Signed)
Janice Ryan to be D/C'd home per MD order. Discussed with the patient and all questions fully answered.   VVS, Skin clean, dry and intact without evidence of skin break down, no evidence of skin tears noted.  IV catheter discontinued intact. Site without signs and symptoms of complications. Dressing and pressure applied.  An After Visit Summary was printed and given to the patient.  Patient escorted via WC, and D/C home via private auto.  [name]  07/31/2018 4:19 PM

## 2018-07-31 NOTE — Progress Notes (Signed)
SATURATION QUALIFICATIONS:  Patient Saturations on Room Air at Rest =94%  Patient Saturations on [employer] while Ambulating = 87% :

## 2018-07-31 NOTE — Progress Notes (Signed)
PT Cancellation Note  Patient Details Name: Janice Ryan MRN: [id number] DOB: [birth year]   Cancelled Treatment:    Reason Eval/Treat Not Completed: Patient declined, no reason specified Pt declined participating in therapy at this time and reports recently ambulating with nursing staff and just received lunch. PT will continue to follow acutely.    Erline Levine, PTA Acute Rehabilitation Services Pager: [phone] Office: [phone]   07/31/2018, 12:10 PM

## 2018-08-01 LAB — CULTURE, BLOOD (ROUTINE X 2)
Culture: NO GROWTH
Culture: NO GROWTH
Special Requests: ADEQUATE
Special Requests: ADEQUATE

## 2018-08-16 ENCOUNTER — Other Ambulatory Visit: Payer: Self-pay

## 2018-08-16 ENCOUNTER — Ambulatory Visit: Payer: Self-pay | Attending: Family Medicine | Admitting: Family Medicine

## 2018-08-16 DIAGNOSIS — Z5321 Procedure and treatment not carried out due to patient leaving prior to being seen by health care provider: Secondary | ICD-10-CM

## 2018-08-21 NOTE — Progress Notes (Signed)
Patient ID: Janice Ryan, female   DOB: 1955/09/04, 63 y.o.   MRN: [id number]   Patient was unable to be contacted/no-show for visit

## 2018-09-04 ENCOUNTER — Inpatient Hospital Stay: Payer: Self-pay | Admitting: Family Medicine

## 2018-09-06 ENCOUNTER — Inpatient Hospital Stay: Payer: Self-pay | Admitting: Nurse Practitioner

## 2018-09-13 ENCOUNTER — Other Ambulatory Visit: Payer: Self-pay

## 2018-09-13 ENCOUNTER — Ambulatory Visit: Payer: Self-pay | Attending: Nurse Practitioner | Admitting: Nurse Practitioner

## 2018-09-13 ENCOUNTER — Encounter: Payer: Self-pay | Admitting: Nurse Practitioner

## 2018-09-13 DIAGNOSIS — I10 Essential (primary) hypertension: Secondary | ICD-10-CM

## 2018-09-13 DIAGNOSIS — J4520 Mild intermittent asthma, uncomplicated: Secondary | ICD-10-CM

## 2018-09-13 MED ORDER — ALBUTEROL SULFATE HFA 108 (90 BASE) MCG/ACT IN AERS: 2.0000 | INHALATION_SPRAY | Freq: Four times a day (QID) | RESPIRATORY_TRACT | 0 refills | Status: DC | PRN

## 2018-09-13 MED ORDER — FLUTICASONE-SALMETEROL 500-50 MCG/DOSE IN AEPB: 1.0000 | INHALATION_SPRAY | Freq: Two times a day (BID) | RESPIRATORY_TRACT | 1 refills | Status: DC

## 2018-09-13 MED ORDER — LISINOPRIL-HYDROCHLOROTHIAZIDE 10-12.5 MG PO TABS: 1.0000 | ORAL_TABLET | Freq: Every day | ORAL | 0 refills | Status: DC

## 2018-09-13 MED ORDER — MONTELUKAST SODIUM 10 MG PO TABS: 10.0000 mg | ORAL_TABLET | Freq: Every day | ORAL | 0 refills | Status: DC

## 2018-09-13 NOTE — Progress Notes (Signed)
Virtual Visit via Telephone Note Due to national recommendations of social distancing due to COVID 19, telehealth visit is felt to be most appropriate for this patient at this time.  I discussed the limitations, risks, security and privacy concerns of performing an evaluation and management service by telephone and the availability of in person appointments. I also discussed with the patient that there may be a patient responsible charge related to this service. The patient expressed understanding and agreed to proceed.    I connected with Ivin PootDonna Boykin-Jefferies on 09/13/18  at   2:30 PM EDT  EDT by telephone and verified that I am speaking with the correct person using two identifiers.   Consent I discussed the limitations, risks, security and privacy concerns of performing an evaluation and management service by telephone and the availability of in person appointments. I also discussed with the patient that there may be a patient responsible charge related to this service. The patient expressed understanding and agreed to proceed.   Location of Patient: Private Residence   [facility]    Persons participating in Telemedicine visit: Bertram DenverZelda Fleming FNP-BC YY MilfordBien CMA Lupita LeashDonna Boykin-Jefferies    History of Present Illness: Telemedicine visit for: HFU.   has a past medical history of Asthma, Hypertension, and Morbid obesity (HCC).    Admitted to the hospital on 07-27-2018 and discharged 07-31-2018  DC DX:  1. Acute hypoxic respiratory failure 2/2 chronic asthma with exacerbation (ambulated 3 times in hospital and was discharged home on supplemental O2. Needs orders for sleep study r/u OSA/obesity hypoventilation syndrome).  2. Acute Kidney Injury (likelly prerenal azotemia; resolved upon DC. Needs repeat BMP) Now Taking lisinopril/hctz 10-12.5 3. Right knee pain(only OA on xray) (unable to obtain MRI due to weight) 4. Hypertension   6. Incidental Mediastinal Lymphadenopathy CT angiography chest revealed2.8 x 2.2 cm nodule in the anterior mediastinum.Recommending follow upwith repeat chest CT in 3-6 months or further characterized with PET CT    She has had 2 in office no shows and 1 virtual no show since May 15th.   As soon as I introduced myself on the phone the patient interrupted me stating she needed a refill of tramadol and it needs to be prescribed for 2 tablets 2 times per day due to her pain (she was only prescribed 3 days worth of tramadol upon dc 07-31-2018).  I explained to her that she needs to be seen face to face based on the medications she is requesting to have filled today and that I am not her PCP and this is a follow up call for a hospital follow up. She was to establish care with another PCP on 2 separate dates in which she was a no show. She was then scheduled with me stating it was urgent that she be seen in the office before she lost her oxygen through the DME company  (which I determined was not true when I spoke with the DME representative)  and she was a no show on that date as well after I agreed to see her urgently in the office. She is also requesting gabapentin, ambien, fluticasone, adderall, a steroid dose pack, singulair and ipratropium.  She is very tangential and unable to focus on my questions. I had to request that she be seen in the office for a face to face visit and repeat lab work due to history of AKI and HTN. She is agreeable and will have appointment scheduled with  original PCP. She denies any chest pain, headaches, lightheadedness or palpitations. States she would like more prednisone however she does not sound short of breath nor are their audible wheezes or coughing heard over the phone.    Past Medical History:  Diagnosis Date  . Asthma   . Hypertension   . Morbid obesity ([place])     Past Surgical History:  Procedure Laterality Date  . HERNIA REPAIR    . JOINT REPLACEMENT       No family history on file.  Social History   Socioeconomic History  . Marital status: Married    Spouse name: Not on file  . Number of children: Not on file  . Years of education: Not on file  . Highest education level: Not on file  Occupational History  . Not on file  Social Needs  . Financial resource strain: Not on file  . Food insecurity    Worry: Not on file    Inability: Not on file  . Transportation needs    Medical: Not on file    Non-medical: Not on file  Tobacco Use  . Smoking status: Never Smoker  . Smokeless tobacco: Never Used  Substance and Sexual Activity  . Alcohol use: Never    Frequency: Never  . Drug use: Never  . Sexual activity: Not on file  Lifestyle  . Physical activity    Days per week: Not on file    Minutes per session: Not on file  . Stress: Not on file  Relationships  . Social Herbalist on phone: Not on file    Gets together: Not on file    Attends religious service: Not on file    Active member of club or organization: Not on file    Attends meetings of clubs or organizations: Not on file    Relationship status: Not on file  Other Topics Concern  . Not on file  Social History Narrative  . Not on file     Observations/Objective: Awake, alert and oriented x 3   Review of Systems  Constitutional: Negative for fever, malaise/fatigue and weight loss.  HENT: Negative.  Negative for nosebleeds.   Eyes: Negative.  Negative for blurred vision, double vision and photophobia.  Respiratory: Positive for shortness of breath. Negative for cough, sputum production and wheezing.   Cardiovascular: Negative.  Negative for chest pain, palpitations and leg swelling.  Gastrointestinal: Negative.  Negative for heartburn, nausea and vomiting.  Musculoskeletal: Positive for joint pain. Negative for myalgias.  Neurological: Negative.  Negative for dizziness, focal weakness, seizures and headaches.  Psychiatric/Behavioral: Negative.  Negative  for suicidal ideas.    Assessment and Plan: Julian was evaluated today for hospitalization follow-up.  Diagnoses and all orders for this visit:  Mild intermittent asthma, unspecified whether complicated -     albuterol (PROAIR HFA) 108 (90 Base) MCG/ACT inhaler; Inhale 2 puffs into the lungs every 6 (six) hours as needed for wheezing or shortness of breath. -     montelukast (SINGULAIR) 10 MG tablet; Take 1 tablet (10 mg total) by mouth at bedtime. -     Fluticasone-Salmeterol (ADVAIR) 500-50 MCG/DOSE AEPB; Inhale 1 puff into the lungs 2 (two) times a day for 30 days.  Morbid obesity ([place]) -     Split night study; Future  Essential hypertension -     lisinopril-hydrochlorothiazide (ZESTORETIC) 10-12.5 MG tablet; Take 1 tablet by mouth daily. Continue all antihypertensives as prescribed.  Remember to bring in  your blood pressure log with you for your follow up appointment.  DASH/Mediterranean Diets are healthier choices for HTN.  BP Readings from Last 3 Encounters:  07/31/18 120/80  01/21/18 126/70       Follow Up Instructions F/U with PCP next available in office.     I discussed the assessment and treatment plan with the patient. The patient was provided an opportunity to ask questions and all were answered. The patient agreed with the plan and demonstrated an understanding of the instructions.   The patient was advised to call back or seek an in-person evaluation if the symptoms worsen or if the condition fails to improve as anticipated.  I provided 20 minutes of non-face-to-face time during this encounter including median intraservice time, reviewing previous notes, labs, imaging, medications and explaining diagnosis and management.  Claiborne RiggZelda W Fleming, FNP-BC

## 2018-09-13 NOTE — Progress Notes (Signed)
Needs RF- alprazolam- father passed away, gabapentin Zolpidem fluticosone adderall   Prednisone completed but asthma is not controlled.   Had fallen prior to hospitalization and has right knee and lower back pain  Would like RF on Tramadol

## 2018-09-16 ENCOUNTER — Telehealth: Payer: Self-pay | Admitting: General Practice

## 2018-09-16 NOTE — Telephone Encounter (Signed)
Patient called complaining of leg swelling and wanting to get seen sooner than previously scheduled appt. Please follow up.

## 2018-09-16 NOTE — Telephone Encounter (Signed)
Pt was advised that she was scheduled for the soonest appointment. If she needed earlier evaluation she would need to f/u in the urgent care or ED.

## 2018-09-18 ENCOUNTER — Other Ambulatory Visit: Payer: Self-pay

## 2018-09-18 ENCOUNTER — Encounter: Payer: Self-pay | Admitting: Family Medicine

## 2018-09-18 ENCOUNTER — Ambulatory Visit: Payer: Self-pay | Admitting: Nurse Practitioner

## 2018-09-18 ENCOUNTER — Ambulatory Visit: Payer: Self-pay | Attending: Family Medicine | Admitting: Family Medicine

## 2018-09-18 DIAGNOSIS — M545 Low back pain, unspecified: Secondary | ICD-10-CM

## 2018-09-18 DIAGNOSIS — M25561 Pain in right knee: Secondary | ICD-10-CM

## 2018-09-18 DIAGNOSIS — M5440 Lumbago with sciatica, unspecified side: Secondary | ICD-10-CM

## 2018-09-18 DIAGNOSIS — F411 Generalized anxiety disorder: Secondary | ICD-10-CM

## 2018-09-18 DIAGNOSIS — F909 Attention-deficit hyperactivity disorder, unspecified type: Secondary | ICD-10-CM

## 2018-09-18 MED ORDER — TRAMADOL HCL 50 MG PO TABS: 50.0000 mg | ORAL_TABLET | Freq: Three times a day (TID) | ORAL | 0 refills | Status: DC | PRN

## 2018-09-18 NOTE — Progress Notes (Signed)
Virtual Visit via Telephone Note  I connected with Janice Ryan on 09/18/18 at  2:30 PM EDT by telephone and verified that I am speaking with the correct person using two identifiers.   I discussed the limitations, risks, security and privacy concerns of performing an evaluation and management service by telephone and the availability of in person appointments. I also discussed with the patient that there may be a patient responsible charge related to this service. The patient expressed understanding and agreed to proceed.  Patient Location: her Mother's home Provider Location: Office Others participating in call: call initiated by Emilio Aspen, RMA   History of Present Illness:      63 yo female who missed her initial new patient appointment on 08/16/2018 for hospital follow-up but was seen by another provider here on 09/13/2018 to establish care. She was hospitalized from 07/27/2018-07/31/2018 due to acute hypoxic respiratory failure due to chronic asthma with exacerbation, acute kidney injury, right knee pain, hypertension and incidental mediastinal lymphadenopathy per hospital discharge summary. Patient reports that just prior to her hospitalization, she was coming out of her apartment on the second floor and going down some stairs when her foot slipped on a wet spot and she fell down the stairs landing at the bottom of the stairs with her right knee hitting a concrete slab.  She reports that she had immediate onset of pain in addition to swelling in her right knee.  She states that during her hospitalization her knee was iced which did help with the swelling but she continues to have pain that is a 12 on a 0-to-10 pain scale.  Pain can be sharp and stabbing.  She reports that the pain is only in is worse with walking.  She reports that she has difficulty ambulating/walking due to the knee pain and is currently using a walker.  He does have a history of osteoarthritis of the knee and is  status post left knee replacement in 2014.  She states that she has taken some much ibuprofen to try and help with her knee pain that she has recently noticed some blood when wiping after bowel movements. No abdominal pain, no nausea and no black stools. She has also had some shooting pain in her right hip which seems to shoot down her leg and through her toes.  She does recall a history of being told that she had some bad disc in her back but she has never had issues with this type of pain before.  On questioning she does believe that she had provement in the pain that radiates down her hip and through her toes while she was on prednisone for her asthma.  She reports that she was prescribed tramadol at the time of her hospital discharge states seemed to help and she would like to have a refill.  She reports that her other chronic medications were refilled at this office when she had her prior visit to establish care.  She reports however that she also has issues with ADHD and anxiety the Adderall and Xanax that she has been taking to treat her other issues.       She reports that her father passed away on 21-Aug-2022 and she is currently trying to help her mother deal with this loss.  She is at her mother's house today but would like to come in and sometime next week for an in person visit.  She denies any chest pain or palpitations, no current shortness of  breath, cough or wheezing.  No fever or chills, no headache or dizziness.  No peripheral edema no urinary frequency or dysuria  Past Medical History:  Diagnosis Date  . Asthma   . Hypertension   . Morbid obesity (HCC)     Past Surgical History:  Procedure Laterality Date  . HERNIA REPAIR    . JOINT REPLACEMENT       Social History   Tobacco Use  . Smoking status: Never Smoker  . Smokeless tobacco: Never Used  Substance Use Topics  . Alcohol use: Never    Frequency: Never  . Drug use: Never     No Known Allergies       Observations/Objective: No vital signs or physical exam conducted as visit was done via telephone  Assessment and Plan: 1. Acute pain of right knee She reports acute knee pain after a fall down the stairs at her apartment building prior to her recent hospitalization in April.  X-ray of the right knee done during admission showed no fracture or malalignment.  She did have advanced arthritis in the medial compartment of the knee and patellofemoral compartment.  No definitive knee effusion.  Patient is being referred to orthopedics for further evaluation and treatment of her right knee pain status post fall.  Prescription for tramadol provided x5 days.  Patient should not take ibuprofen at this time as she reports some rectal bleeding after use of ibuprofen.  She is to follow-up for an in office appointment next week and will discuss the need for GI referral if she has continued issues with bleeding or if her colonoscopy is not up to date. - AMB referral to orthopedics - traMADol (ULTRAM) 50 MG tablet; Take 1 tablet (50 mg total) by mouth every 8 (eight) hours as needed for up to 5 days for moderate pain.  Dispense: 15 tablet; Refill: 0  2. Low back pain with radiation Discussed with the patient that I believe that the pain that she is experiencing in the right posterior hip with radiation of pain down to her foot is likely related to lumbar radiculopathy as she did have improvement in symptoms while on prednisone for treatment of her asthma. She will follow-up with Orthopedics.   3. Attention deficit hyperactivity disorder (ADHD), unspecified ADHD type; 4. GAD (generalized anxiety disorder) Will place referral to psychiatry for further evaluation and treatment of her ADHD and  anxiety which has increased status post her recent hospitalization due to her hospitalization and the recent death of her father. Also for follow-up of her  - Ambulatory referral to Psychiatry    Follow Up Instructions:No  follow-ups on file.    I discussed the assessment and treatment plan with the patient. The patient was provided an opportunity to ask questions and all were answered. The patient agreed with the plan and demonstrated an understanding of the instructions.   The patient was advised to call back or seek an in-person evaluation if the symptoms worsen or if the condition fails to improve as anticipated.  I provided 16 minutes of non-face-to-face time during this encounter.   Cain Saupeammie Bronc Brosseau, MD

## 2018-09-18 NOTE — Progress Notes (Signed)
Lamping/pain in her right leg the day she went to the hospital. (April 25th), Sprang and was given Tramadol   Med refills  She was going to [facility] and she stopped due to being over charged.   Father passed May 13  Feels Tired and have little energy   Interested in Therapy  Referral to [facility] and Orthopedic doctor as well

## 2018-09-20 ENCOUNTER — Telehealth: Payer: Self-pay | Admitting: Family Medicine

## 2018-09-20 ENCOUNTER — Other Ambulatory Visit: Payer: Self-pay | Admitting: Family Medicine

## 2018-09-20 ENCOUNTER — Ambulatory Visit: Payer: Self-pay | Admitting: Family Medicine

## 2018-09-20 DIAGNOSIS — M545 Low back pain, unspecified: Secondary | ICD-10-CM

## 2018-09-20 DIAGNOSIS — M25561 Pain in right knee: Secondary | ICD-10-CM

## 2018-09-20 MED ORDER — TRAMADOL HCL 50 MG PO TABS: 50.0000 mg | ORAL_TABLET | Freq: Three times a day (TID) | ORAL | 0 refills | Status: DC | PRN

## 2018-09-20 NOTE — Telephone Encounter (Signed)
Patients call returned.  Patient identified by name and date of birth.  Patient statess he is out of her tramadol for her knee.  Ortho has not called her to set an appointment.  Patient is to see Dr Fulp on Wednesday and needs pain medication to last her until then,  Patient informed that information would be passed on to PCP and when new information was available patient would be notified.  Patient acknowledged understanding of advice.  

## 2018-09-20 NOTE — Progress Notes (Signed)
Patient ID: Janice Ryan, female   DOB: 1955/12/13, 63 y.o.   MRN: [id number]   Patient left phone message today requesting refill of tramadol as she states she has not been contacted yet by orthopedics and she has completed her recently prescribed tramadol.  Patient was given 5-day supply of tramadol on 09/18/2018 and is calling for refill today on 09/20/2018.  We will send an additional 5-day supply with no early refill

## 2018-09-20 NOTE — Telephone Encounter (Signed)
Patient called stating she would like to be prescribed tramadol states she has already taken the max amount of IBprofen and is in an enormous amount of pain please follow up.

## 2018-09-20 NOTE — Telephone Encounter (Signed)
Let patient know that I gave her a 5 day supply of tramadol on June 17 at her visit. I sent in an additional 5 days but when she runs out of the new RX she can resume ibuprofen or tylenol until she can be seen by Orthopedics

## 2018-09-20 NOTE — Telephone Encounter (Signed)
Pt aware of the message per Dr. Chapman Fitch. Verbalized understanding.

## 2018-09-20 NOTE — Telephone Encounter (Signed)
Patients call returned.  Patient identified by name and date of birth.  Patient statess he is out of her tramadol for her knee.  Ortho has not called her to set an appointment.  Patient is to see Dr Chapman Fitch on Wednesday and needs pain medication to last her until then,  Patient informed that information would be passed on to PCP and when new information was available patient would be notified.  Patient acknowledged understanding of advice.

## 2018-09-20 NOTE — Telephone Encounter (Signed)
New Message  Pt states she was only prescribed 3 days of tramadol and she was only able to get an appt on 6/24 so she would like enough Tramadol until then. Please f/u

## 2018-09-20 NOTE — Telephone Encounter (Signed)
She was prescribed 5 days of tramadol #15 pills to take 1 pill three times per day as needed on 09/18/2018 and new RX sent in today but she will need to take otc ibuprofen or tylenol if she runs out between now and her Ortho appointment

## 2018-09-22 ENCOUNTER — Other Ambulatory Visit: Payer: Self-pay | Admitting: Internal Medicine

## 2018-09-25 ENCOUNTER — Encounter: Payer: Self-pay | Admitting: Family Medicine

## 2018-09-25 ENCOUNTER — Other Ambulatory Visit: Payer: Self-pay

## 2018-09-25 ENCOUNTER — Ambulatory Visit: Payer: Self-pay | Attending: Family Medicine | Admitting: Family Medicine

## 2018-09-25 DIAGNOSIS — M25561 Pain in right knee: Secondary | ICD-10-CM

## 2018-09-25 DIAGNOSIS — J441 Chronic obstructive pulmonary disease with (acute) exacerbation: Secondary | ICD-10-CM

## 2018-09-25 DIAGNOSIS — J45901 Unspecified asthma with (acute) exacerbation: Secondary | ICD-10-CM

## 2018-09-25 DIAGNOSIS — Z8739 Personal history of other diseases of the musculoskeletal system and connective tissue: Secondary | ICD-10-CM

## 2018-09-25 DIAGNOSIS — M545 Low back pain, unspecified: Secondary | ICD-10-CM

## 2018-09-25 DIAGNOSIS — M5440 Lumbago with sciatica, unspecified side: Secondary | ICD-10-CM

## 2018-09-25 MED ORDER — DULOXETINE HCL 60 MG PO CPEP: 60.0000 mg | ORAL_CAPSULE | Freq: Every day | ORAL | 3 refills | Status: DC

## 2018-09-25 MED ORDER — GABAPENTIN 300 MG PO CAPS: 300.0000 mg | ORAL_CAPSULE | Freq: Three times a day (TID) | ORAL | 3 refills | Status: DC

## 2018-09-25 MED ORDER — TRAMADOL HCL 50 MG PO TABS: 50.0000 mg | ORAL_TABLET | Freq: Three times a day (TID) | ORAL | 0 refills | Status: AC | PRN

## 2018-09-25 MED ORDER — ALBUTEROL SULFATE HFA 108 (90 BASE) MCG/ACT IN AERS: 2.0000 | INHALATION_SPRAY | Freq: Four times a day (QID) | RESPIRATORY_TRACT | 3 refills | Status: DC | PRN

## 2018-09-25 MED ORDER — PREDNISONE 20 MG PO TABS: ORAL_TABLET | ORAL | 0 refills | Status: DC

## 2018-09-25 NOTE — Progress Notes (Signed)
Patient called stating she do not know if she have Covid 19 or not. Per pt she is having fever  Wheezing to the point her inhalers are not working, pain on her side when she breaths in,   Per pt she is also having right knee pain and need medication refills. Per pt she is taking large dose of Ibuprofen to where she have noticed she's bleeding. Per pt she can't even put weight on that leg  Per pt she is needing referral to the psych doctor so she can get her Xanax and Adderall filled.   Per pt she is needing refills for her Gabapentin, Cymbalta, Tramadol and Ambien.   Per pt her Ortho appt is Tuesday June 30 at 2pm.  Per pt she is SOB and worried about her asthma and may need some Prednisone or something.   Per pt she have Fibromyalgia

## 2018-09-25 NOTE — Progress Notes (Signed)
Virtual Visit via Telephone Note  I connected with Janice Ryan on 09/25/18 at  2:50 PM EDT by telephone and verified that I am speaking with the correct person using two identifiers.   I discussed the limitations, risks, security and privacy concerns of performing an evaluation and management service by telephone and the availability of in person appointments. I also discussed with the patient that there may be a patient responsible charge related to this service. The patient expressed understanding and agreed to proceed.  Patient Location: Home  Provider Location: Office Others participating in call: Guillermina Cityctavia Richard, RMA   History of Present Illness:       63 year old female status post 09/18/2018 telemedicine visit for acute right knee pain, low back pain with radiation for which she was referred to orthopedics as well as complaint of anxiety and ADHD for which she was referred to psychiatry who states that her orthopedic appointment is not until this upcoming Tuesday and she is out of pain medication and needs a refill of tramadol.  She reports that she is having pain that is a 10 on a 0-to-10 scale in her right knee with increased pain with trying to walk.  Patient states that this is more than a simple knee sprain and she needs more tramadol so that she can function.  She reports difficulty bearing weight on the right leg due to her knee pain and states that she has taken some much ibuprofen that she believes that she is having some stomach upset.  She believes she also may have seen some blood in her stool x1 last week.  She denies any black stools and has seen no further blood/bleeding.  She denies any current abdominal pain or nausea.      Patient also reports increased shortness of breath and sensation of wheezing.  She initially stated that perhaps she had COVID-19 and when she was encouraged to go to the emergency department for further evaluation, she states that she believes  that it is just a flareup of her asthma and that she would feel better with prednisone.  She feels that her increased shortness of breath and wheezing have been present for about a week and she has been using her albuterol inhaler more often.      Patient also states that she has a history of fibromyalgia and needs refill of gabapentin and Cymbalta.  She states that she was started on this medication before moving to this area to treat her fibromyalgia and she was also given tramadol.  She reports generalized muscle aches and muscle pain.  She also has chronic low back pain with radiation.      She also needs referral to establish with psychiatry for treatment of her ADHD and anxiety.  She reports that she was previously taking Adderall and Xanax.  She denies suicidal thoughts or ideations.  On review of systems, she additionally denies any chest pain or palpitations.  No headaches or dizziness.  No nausea or vomiting.  No urinary frequency, urgency or dysuria.  No sore throat or difficulty swallowing.  No fever or chills.    Past Medical History:  Diagnosis Date  . Asthma   . Hypertension   . Morbid obesity (HCC)     Past Surgical History:  Procedure Laterality Date  . HERNIA REPAIR    . JOINT REPLACEMENT      History reviewed. No pertinent family history.  Social History   Tobacco Use  . Smoking status: Never  Smoker  . Smokeless tobacco: Never Used  Substance Use Topics  . Alcohol use: Never    Frequency: Never  . Drug use: Never     No Known Allergies     Observations/Objective: No vital signs or physical exam conducted as visit was done via telephone  Assessment and Plan: 1. Acute pain of right knee; 2. Low back pain with radiation Keep upcoming appointment with Orthopedics this Tuesday and again given a 5 day supply of tramadol for acute pain per patient request. She is also being referred to pain management in follow-up of her chronic low back pain with radiation and  patient with complaint of past diagnosis of fibromyalgia which has caused her to have chronic pain.  - Ambulatory referral to Pain Clinic - traMADol (ULTRAM) 50 MG tablet; Take 1 tablet (50 mg total) by mouth every 8 (eight) hours as needed for up to 5 days for moderate pain. 5 day supply  Dispense: 15 tablet; Refill: 0  3. Asthma, Chronic Obstructive, with acute exacerbation Patient has required past hospitalization due to her asthma and she reports that she is currently having increased shortness of breath and wheezing. She is encouraged to go to the ED for further evaluation and treatment as patient also initially reported concern about COVID but later denied this concern. Prednisone was also refilled but she is still advised to follow-up at the ED and will place pulmonology referral.  - albuterol (PROAIR HFA) 108 (90 Base) MCG/ACT inhaler; Inhale 2 puffs into the lungs every 6 (six) hours as needed for wheezing or shortness of breath.  Dispense: 18 g; Refill: 3  4. Personal history of fibromyalgia Patient reports a personal history of fibromyalgia for which she was on gabapentin and duloxetine but she reports that she ran out of this medication about 3 months ago and would like to have refills of this medication. I do not have any past medical records on patient at this time. She reports that she moved from another state earlier this year.  - DULoxetine (CYMBALTA) 60 MG capsule; Take 1 capsule (60 mg total) by mouth daily.  Dispense: 30 capsule; Refill: 3 - gabapentin (NEURONTIN) 300 MG capsule; Take 1 capsule (300 mg total) by mouth 3 (three) times daily.  Dispense: 90 capsule; Refill: 3  *Patient was asked to contact referral coordinator at this office as referral has previously been placed for her to see psychiatry in follow-up of her ADHD and anxiety for which she is on Adderall and Xanax and she is aware that this office will not prescribe these medications for her.  Follow Up  Instructions:Return in about 4 weeks (around 10/23/2018) for chronic issues.    I discussed the assessment and treatment plan with the patient. The patient was provided an opportunity to ask questions and all were answered. The patient agreed with the plan and demonstrated an understanding of the instructions.   The patient was advised to call back or seek an in-person evaluation if the symptoms worsen or if the condition fails to improve as anticipated.  I provided 15  minutes of non-face-to-face time during this encounter.   Antony Blackbird, MD

## 2018-10-01 ENCOUNTER — Telehealth: Payer: Self-pay | Admitting: Family Medicine

## 2018-10-01 ENCOUNTER — Ambulatory Visit: Payer: Self-pay | Admitting: Orthopaedic Surgery

## 2018-10-01 NOTE — Telephone Encounter (Signed)
New Message   Pt is calling to request another prescription for Prednisone. She states her asthma is still bothering her

## 2018-10-01 NOTE — Telephone Encounter (Signed)
Nurse called the patient's home phone number but received no answer and message was left on the voicemail for the patient to call back.  Return phone number given.  Patients message states she needs more prednisone but per prescription she should still have pills left.

## 2018-10-01 NOTE — Telephone Encounter (Signed)
Patient needs an appointment or should go to the ED if she is still having issues with her asthma

## 2018-10-02 ENCOUNTER — Ambulatory Visit: Payer: Self-pay | Admitting: Orthopaedic Surgery

## 2018-10-02 NOTE — Telephone Encounter (Signed)
LMOM

## 2018-10-03 NOTE — Telephone Encounter (Signed)
Nurse called the patient's home phone number but received no answer and message was left on the voicemail for the patient to call back.  Return phone number given. 

## 2018-10-07 NOTE — Progress Notes (Deleted)
   Subjective:    Patient ID: Janice Ryan, female    DOB: 1955/11/08, 63 y.o.   MRN: [id number]  Asthma referral  Asthma, Chronic Obstructive, with acute exacerbation Patient has required past hospitalization due to her asthma and she reports that she is currently having increased shortness of breath and wheezing. She is encouraged to go to the ED for further evaluation and treatment as patient also initially reported concern about COVID but later denied this concern. Prednisone was also refilled but she is still advised to follow-up at the ED and will place pulmonology referral.  - albuterol (PROAIR HFA) 108 (90 Base) MCG/ACT inhaler; Inhale 2 puffs into the lungs every 6 (six) hours as needed for wheezing or shortness of breath.  Dispense: 18 g; Refill: 3   Asthma Her past medical history is significant for asthma.      Review of Systems     Objective:   Physical Exam        Assessment & Plan:

## 2018-10-08 ENCOUNTER — Ambulatory Visit: Payer: Self-pay | Admitting: Orthopaedic Surgery

## 2018-10-08 ENCOUNTER — Ambulatory Visit: Payer: Self-pay | Admitting: Critical Care Medicine

## 2018-10-09 ENCOUNTER — Ambulatory Visit: Payer: Self-pay | Admitting: Orthopaedic Surgery

## 2018-10-10 ENCOUNTER — Ambulatory Visit: Payer: Self-pay | Admitting: Orthopaedic Surgery

## 2018-10-11 ENCOUNTER — Telehealth: Payer: Self-pay | Admitting: Internal Medicine

## 2018-10-11 ENCOUNTER — Ambulatory Visit: Payer: Self-pay | Admitting: Orthopaedic Surgery

## 2018-10-11 NOTE — Telephone Encounter (Signed)
Copied from [place] [id number]. Topic: Appointment Scheduling - Scheduling Inquiry for Clinic >> Oct 11, 2018  2:25 PM Sheran Luz wrote: Patient states that Dr. Sharlet Salina approved her as a new patient (Her mother is current patient)  Could find no supporting documentation. Please contact patient to schedule.

## 2018-10-11 NOTE — Telephone Encounter (Signed)
Fine

## 2018-10-11 NOTE — Telephone Encounter (Signed)
Patient is requesting to be a current patient of yours. Would you be willing to see her to establish care? See message below.

## 2018-10-14 NOTE — Telephone Encounter (Signed)
Appointment scheduled.

## 2018-10-15 ENCOUNTER — Ambulatory Visit: Payer: Self-pay | Admitting: Orthopaedic Surgery

## 2018-10-17 ENCOUNTER — Ambulatory Visit: Payer: Self-pay | Admitting: Internal Medicine

## 2018-10-22 ENCOUNTER — Other Ambulatory Visit: Payer: Self-pay | Admitting: Nurse Practitioner

## 2018-10-22 DIAGNOSIS — I10 Essential (primary) hypertension: Secondary | ICD-10-CM

## 2018-10-28 ENCOUNTER — Ambulatory Visit: Payer: Self-pay | Admitting: Internal Medicine

## 2018-11-01 ENCOUNTER — Telehealth: Payer: Self-pay | Admitting: *Deleted

## 2018-11-01 ENCOUNTER — Other Ambulatory Visit: Payer: Self-pay | Admitting: Family Medicine

## 2018-11-01 DIAGNOSIS — J45901 Unspecified asthma with (acute) exacerbation: Secondary | ICD-10-CM

## 2018-11-01 DIAGNOSIS — J441 Chronic obstructive pulmonary disease with (acute) exacerbation: Secondary | ICD-10-CM

## 2018-11-01 NOTE — Telephone Encounter (Signed)
Patient calling stating her asthma is acting up again. Per pt whenever she calls with this issues, provider calls her in Prednisone and this normally help.

## 2018-11-02 ENCOUNTER — Other Ambulatory Visit: Payer: Self-pay | Admitting: Nurse Practitioner

## 2018-11-02 DIAGNOSIS — J4520 Mild intermittent asthma, uncomplicated: Secondary | ICD-10-CM

## 2018-11-02 DIAGNOSIS — J45901 Unspecified asthma with (acute) exacerbation: Secondary | ICD-10-CM

## 2018-11-02 DIAGNOSIS — J441 Chronic obstructive pulmonary disease with (acute) exacerbation: Secondary | ICD-10-CM

## 2018-11-02 MED ORDER — FLUTICASONE-SALMETEROL 500-50 MCG/DOSE IN AEPB: 1.0000 | INHALATION_SPRAY | Freq: Two times a day (BID) | RESPIRATORY_TRACT | 1 refills | Status: DC

## 2018-11-02 MED ORDER — MONTELUKAST SODIUM 10 MG PO TABS: 10.0000 mg | ORAL_TABLET | Freq: Every day | ORAL | 0 refills | Status: DC

## 2018-11-02 MED ORDER — PREDNISONE 20 MG PO TABS: ORAL_TABLET | ORAL | 0 refills | Status: DC

## 2018-11-02 NOTE — Telephone Encounter (Signed)
Refill sent. Her chart is also showing noncompliance with her advair inhaler as it has expired for refill. In order to decrease steroid use you should be using your inhalers as prescribed.

## 2018-11-04 NOTE — Telephone Encounter (Signed)
Left message on voicemail informing patient medication was sent to [facility] pharmacy.

## 2018-11-05 ENCOUNTER — Ambulatory Visit: Payer: Self-pay | Admitting: Internal Medicine

## 2018-11-07 ENCOUNTER — Telehealth: Payer: Self-pay | Admitting: Family Medicine

## 2018-11-07 ENCOUNTER — Ambulatory Visit: Payer: Self-pay | Admitting: Internal Medicine

## 2018-11-07 ENCOUNTER — Other Ambulatory Visit: Payer: Self-pay | Admitting: Family Medicine

## 2018-11-07 DIAGNOSIS — Z8739 Personal history of other diseases of the musculoskeletal system and connective tissue: Secondary | ICD-10-CM

## 2018-11-07 MED ORDER — GABAPENTIN 300 MG PO CAPS: 300.0000 mg | ORAL_CAPSULE | Freq: Three times a day (TID) | ORAL | 0 refills | Status: DC

## 2018-11-07 MED ORDER — DULOXETINE HCL 60 MG PO CPEP: 60.0000 mg | ORAL_CAPSULE | Freq: Every day | ORAL | 0 refills | Status: DC

## 2018-11-07 NOTE — Telephone Encounter (Signed)
LMOM

## 2018-11-07 NOTE — Telephone Encounter (Signed)
Left message on voicemail to return call.

## 2018-11-07 NOTE — Telephone Encounter (Signed)
It appears that patient has an appointment to establish care tomorrow with Dr. Pricilla Holm. She can get needed refills of her gabapentin and duloxetine at her visit tomorrow-please let patient know to ask new provider about her need for refills

## 2018-11-07 NOTE — Progress Notes (Signed)
Patient ID: Janice Ryan, female   DOB: [birth year], 63 y.o.   MRN: [id number]   See phone message; patient requests refills of duloxetine and gabapentin after leaving her medications when she was in another state. Patient could not be worked into the schedule today for asthma follow-up. It appears that she is establishing care tomorrow with Pricilla Holm, MD.

## 2018-11-07 NOTE — Telephone Encounter (Signed)
Patient states someone prescribed prednisone last Friday for her SOB.  But it is not helping and is afraid she is going to have a set back. Using the nebulizer and all medications inhalers.   Out of prednisone again.   She states she would be more that happy to speak to a provider- Dr. Joya Gaskins or Dr. Chapman Fitch. Patient advised that there are no appointment availabilities for either provider.   Mentioned to patient that she has two PCP's  in the system and would need to decide between the two who would deliver her care. Encourage to f/u tomorrow with the PCP she has listed for her asthma since she refuses to go to [facility] or ED.   Please send medications to [place] that is listed.

## 2018-11-07 NOTE — Telephone Encounter (Signed)
Refills of gabapentin and duloxetine sent to patient's pharmacy

## 2018-11-07 NOTE — Telephone Encounter (Signed)
Patient called stating she is having difficulty breathing and with her asthma and was recently out of town and has misplaced her gabapentin and duloxetine and states she had half a prescription left and would like to know what could be done. Please follow up.

## 2018-11-07 NOTE — Telephone Encounter (Signed)
She needs to go to urgent care regarding her asthma unless she can be seen here today and refills can be sent in of her gabapentin and duloxetine-find out which pharmacy she prefers

## 2018-11-08 ENCOUNTER — Ambulatory Visit: Payer: Self-pay | Admitting: Internal Medicine

## 2018-11-08 ENCOUNTER — Other Ambulatory Visit: Payer: Self-pay | Admitting: Nurse Practitioner

## 2018-11-08 DIAGNOSIS — J441 Chronic obstructive pulmonary disease with (acute) exacerbation: Secondary | ICD-10-CM

## 2018-11-08 DIAGNOSIS — J45901 Unspecified asthma with (acute) exacerbation: Secondary | ICD-10-CM

## 2018-11-11 ENCOUNTER — Ambulatory Visit: Payer: Self-pay | Admitting: Internal Medicine

## 2018-11-11 DIAGNOSIS — Z0289 Encounter for other administrative examinations: Secondary | ICD-10-CM

## 2018-11-15 ENCOUNTER — Ambulatory Visit: Payer: Self-pay | Admitting: Internal Medicine

## 2018-11-15 ENCOUNTER — Other Ambulatory Visit: Payer: Self-pay | Admitting: Nurse Practitioner

## 2018-11-15 DIAGNOSIS — J441 Chronic obstructive pulmonary disease with (acute) exacerbation: Secondary | ICD-10-CM

## 2018-11-15 DIAGNOSIS — Z0289 Encounter for other administrative examinations: Secondary | ICD-10-CM

## 2018-11-19 ENCOUNTER — Ambulatory Visit: Payer: Self-pay | Admitting: Orthopaedic Surgery

## 2018-11-20 ENCOUNTER — Other Ambulatory Visit: Payer: Self-pay | Admitting: Nurse Practitioner

## 2018-11-20 DIAGNOSIS — J4520 Mild intermittent asthma, uncomplicated: Secondary | ICD-10-CM

## 2018-11-22 ENCOUNTER — Telehealth: Payer: Self-pay | Admitting: Family Medicine

## 2018-11-22 NOTE — Telephone Encounter (Signed)
LMOM

## 2018-11-22 NOTE — Telephone Encounter (Signed)
Prednisone Furosemide   [facility] on [address].

## 2018-11-25 ENCOUNTER — Other Ambulatory Visit: Payer: Self-pay

## 2018-11-25 ENCOUNTER — Ambulatory Visit: Payer: Self-pay | Attending: Family Medicine | Admitting: Family Medicine

## 2018-11-25 DIAGNOSIS — M722 Plantar fascial fibromatosis: Secondary | ICD-10-CM

## 2018-11-25 DIAGNOSIS — J4520 Mild intermittent asthma, uncomplicated: Secondary | ICD-10-CM

## 2018-11-25 DIAGNOSIS — Z8739 Personal history of other diseases of the musculoskeletal system and connective tissue: Secondary | ICD-10-CM

## 2018-11-25 DIAGNOSIS — R600 Localized edema: Secondary | ICD-10-CM

## 2018-11-25 DIAGNOSIS — F909 Attention-deficit hyperactivity disorder, unspecified type: Secondary | ICD-10-CM

## 2018-11-25 MED ORDER — FLUTICASONE-SALMETEROL 500-50 MCG/DOSE IN AEPB: 1.0000 | INHALATION_SPRAY | Freq: Two times a day (BID) | RESPIRATORY_TRACT | 1 refills | Status: DC

## 2018-11-25 MED ORDER — PREDNISONE 20 MG PO TABS: 20.0000 mg | ORAL_TABLET | Freq: Two times a day (BID) | ORAL | 0 refills | Status: DC

## 2018-11-25 MED ORDER — GABAPENTIN 300 MG PO CAPS: 300.0000 mg | ORAL_CAPSULE | Freq: Three times a day (TID) | ORAL | 1 refills | Status: DC

## 2018-11-25 MED ORDER — DULOXETINE HCL 60 MG PO CPEP: 60.0000 mg | ORAL_CAPSULE | Freq: Every day | ORAL | 1 refills | Status: DC

## 2018-11-25 MED ORDER — FUROSEMIDE 20 MG PO TABS: 20.0000 mg | ORAL_TABLET | Freq: Every day | ORAL | 1 refills | Status: DC

## 2018-11-25 MED FILL — FLUTICASONE-SALMETEROL 500-: 500-50 | 30 days supply | Qty: 60 | Fill #0

## 2018-11-25 NOTE — Progress Notes (Signed)
Virtual Visit via Telephone Note  I connected with Janice Ryan, on 11/25/2018 at 2:58 PM by telephone due to the COVID-19 pandemic and verified that I am speaking with the correct person using two identifiers.   Consent: I discussed the limitations, risks, security and privacy concerns of performing an evaluation and management service by telephone and the availability of in person appointments. I also discussed with the patient that there may be a patient responsible charge related to this service. The patient expressed understanding and agreed to proceed.   Location of Patient: Home  Location of Provider: Clinic   Persons participating in Telemedicine visit: Kriston Mckinnie Farrington-CMA Dr. Felecia Shelling     History of Present Illness: 63 year old female patient of Dr.Fulp with a medical history significant for asthma, hypertension, who presents today for an acute visit complaining of "being under the weather".  She has multiple complaints which includes a one week history of pain in bottom of feet, associated feet swelling, using ice. Pain is worse in the morning and at night - 9-10/10 She had Lasix for edema in her feet, ankle  in the past and threw her bottle away by accidentally. Also complains of dyspnea, wheezing.  Which improved with Prednisone but returned once course of steroid completed. She has nasal congestion but no fever, chest pain.  On review of her medications she should be on Advair which she has not been compliant with due to inability to afford this but has been compliant with pro-air and Singulair. Also has fibromyalgia and has been out of gabapentin. Has ADHD, moved here from [place] and is needing refill of Doxepin.  Past Medical History:  Diagnosis Date  . Asthma   . Hypertension   . Morbid obesity ([place])    No Known Allergies  Current Outpatient Medications on File Prior to Visit  Medication Sig Dispense Refill  . acetaminophen  (TYLENOL) 500 MG tablet Take 1,000 mg by mouth every 6 (six) hours as needed for headache (pain).    Marland Kitchen albuterol (PROAIR HFA) 108 (90 Base) MCG/ACT inhaler Inhale 2 puffs into the lungs every 6 (six) hours as needed for wheezing or shortness of breath. 18 g 3  . ALPRAZolam (XANAX) 0.5 MG tablet Take 0.5 mg by mouth daily.    Marland Kitchen amphetamine-dextroamphetamine (ADDERALL) 10 MG tablet Take 10 mg by mouth 2 (two) times a day.    . Cholecalciferol (VITAMIN D3 PO) Take 1 tablet by mouth daily.    . DULoxetine (CYMBALTA) 60 MG capsule Take 1 capsule (60 mg total) by mouth daily. 30 capsule 0  . Ferrous Sulfate (IRON PO) Take 1 tablet by mouth every other day.    . Fluticasone-Salmeterol (ADVAIR) 500-50 MCG/DOSE AEPB Inhale 1 puff into the lungs 2 (two) times a day. 60 each 1  . gabapentin (NEURONTIN) 300 MG capsule Take 1 capsule (300 mg total) by mouth 3 (three) times daily. 90 capsule 0  . ibuprofen (ADVIL) 800 MG tablet Take 1 tablet (800 mg total) by mouth every 8 (eight) hours as needed. 21 tablet 0  . lisinopril-hydrochlorothiazide (ZESTORETIC) 10-12.5 MG tablet TAKE ONE TABLET BY MOUTH DAILY 30 tablet 0  . montelukast (SINGULAIR) 10 MG tablet TAKE ONE TABLET BY MOUTH EVERY NIGHT AT BEDTIME 30 tablet 0  . Multiple Vitamin (MULTIVITAMIN WITH MINERALS) TABS tablet Take 1 tablet by mouth daily.    . OXYGEN Inhale into the lungs at bedtime as needed (2 L).    . zolpidem (AMBIEN) 5 MG tablet Take  5 mg by mouth at bedtime as needed for sleep.    . predniSONE (DELTASONE) 20 MG tablet 2 pills daily x 2 days, 1 pill daily x 2 days then 1/2 pill daily for 4 days (Patient not taking: Reported on 11/25/2018) 8 tablet 0   No current facility-administered medications on file prior to visit.     Observations/Objective: Awake, alert, oriented x3 Not in acute distress  Assessment and Plan: 1. Mild intermittent asthma, unspecified whether complicated Uncontrolled due to not being on a controller medication I  have sent Advair prescription to the pharmacy in-house and will provide a short course of prednisone Continue pro-air as needed - Fluticasone-Salmeterol (ADVAIR) 500-50 MCG/DOSE AEPB; Inhale 1 puff into the lungs 2 (two) times daily.  Dispense: 60 each; Refill: 1  2. Personal history of fibromyalgia - DULoxetine (CYMBALTA) 60 MG capsule; Take 1 capsule (60 mg total) by mouth daily.  Dispense: 30 capsule; Refill: 1 - gabapentin (NEURONTIN) 300 MG capsule; Take 1 capsule (300 mg total) by mouth 3 (three) times daily.  Dispense: 90 capsule; Refill: 1  3. Plantar fasciitis Use insoles - predniSONE (DELTASONE) 20 MG tablet; Take 1 tablet (20 mg total) by mouth 2 (two) times daily with a meal.  Dispense: 10 tablet; Refill: 0  4. Pedal edema Reduce sodium intake, elevate feet, use compression stockings Placed on low-dose Lasix - furosemide (LASIX) 20 MG tablet; Take 1 tablet (20 mg total) by mouth daily.  Dispense: 30 tablet; Refill: 1  5. Attention deficit hyperactivity disorder (ADHD), unspecified ADHD type Review of PCP notes indicate psych referral was recommended She will follow-up with PCP for this   Follow Up Instructions: Return in about 2 weeks (around 12/09/2018) for additional medical concerns with PCP- Dr Jillyn HiddenFulp.     I discussed the assessment and treatment plan with the patient. The patient was provided an opportunity to ask questions and all were answered. The patient agreed with the plan and demonstrated an understanding of the instructions.   The patient was advised to call back or seek an in-person evaluation if the symptoms worsen or if the condition fails to improve as anticipated.     I provided 20 minutes total of non-face-to-face time during this encounter including median intraservice time, reviewing previous notes, labs, imaging, medications, management and patient verbalized understanding.     Janice RegisterEnobong Marquarius Lofton, MD, FAAFP. [facility]  [place] [address], [place] [phone]   11/25/2018, 2:58 PM

## 2018-11-25 NOTE — Progress Notes (Signed)
Patient has been called and DOB has been verified. Patient has been screened and transferred to PCP to start phone visit.   Patient is having pain on bottom of feet.  Patient states that she is having swelling in her feet.  Patient get SOB when walking.

## 2018-11-26 ENCOUNTER — Ambulatory Visit: Payer: Self-pay | Admitting: Orthopaedic Surgery

## 2018-11-26 ENCOUNTER — Ambulatory Visit: Payer: Self-pay | Admitting: Critical Care Medicine

## 2018-11-28 ENCOUNTER — Telehealth: Payer: Self-pay | Admitting: Orthopaedic Surgery

## 2018-11-28 NOTE — Telephone Encounter (Signed)
Patient called and left a vm to make an appt. Called pt and n/a LMOM to call and make an appt.

## 2018-11-28 NOTE — Telephone Encounter (Signed)
Medication was sent to patient pharmacy 8.24.20

## 2018-12-05 ENCOUNTER — Encounter: Payer: Self-pay | Admitting: Family Medicine

## 2018-12-05 ENCOUNTER — Ambulatory Visit (HOSPITAL_BASED_OUTPATIENT_CLINIC_OR_DEPARTMENT_OTHER): Payer: Self-pay | Admitting: Family Medicine

## 2018-12-05 DIAGNOSIS — M79672 Pain in left foot: Secondary | ICD-10-CM

## 2018-12-05 DIAGNOSIS — G8929 Other chronic pain: Secondary | ICD-10-CM

## 2018-12-05 DIAGNOSIS — M25561 Pain in right knee: Secondary | ICD-10-CM

## 2018-12-05 DIAGNOSIS — J4541 Moderate persistent asthma with (acute) exacerbation: Secondary | ICD-10-CM

## 2018-12-05 DIAGNOSIS — M79671 Pain in right foot: Secondary | ICD-10-CM

## 2018-12-05 DIAGNOSIS — F411 Generalized anxiety disorder: Secondary | ICD-10-CM

## 2018-12-05 MED ORDER — BUSPIRONE HCL 5 MG PO TABS: 5.0000 mg | ORAL_TABLET | Freq: Three times a day (TID) | ORAL | 1 refills | Status: DC

## 2018-12-05 MED ORDER — TRAMADOL HCL 50 MG PO TABS: 50.0000 mg | ORAL_TABLET | Freq: Three times a day (TID) | ORAL | 0 refills | Status: AC | PRN

## 2018-12-05 MED ORDER — ALBUTEROL SULFATE (2.5 MG/3ML) 0.083% IN NEBU: 2.5000 mg | INHALATION_SOLUTION | Freq: Four times a day (QID) | RESPIRATORY_TRACT | 11 refills | Status: DC | PRN

## 2018-12-05 NOTE — Progress Notes (Signed)
Patient verified DOB Patient has eaten today, Patient has not taken medication today. Patient complains of bilateral pain in the knees and feels like she has plantar fascitis which has been present for 2 weeks. Patient has been using O2 during the day.

## 2018-12-05 NOTE — Progress Notes (Signed)
Patient telephone visit  Subjective:  Patient ID: Janice Ryan, female    DOB: Oct 11, 1955  Age: 63 y.o. MRN: [id number]  Patient location: Home Provider location: Home Office Others participating in the call: Call initiated by [place], [place] who then transferred the call to me  I connected with Jolynda Townley on 12/05/2018 at 12:35 PM by telephone and verified that I was speaking with the correct person using 2 identifiers.  I discussed the limitations, risk, security and privacy concerns of performing an evaluation and management service by telephone and the availability of-person appointments.  I also discussed with the patient that there may be a patient responsive chart related to today's telemedicine visit.  The patient expressed understanding and agreed to proceed.  CC:  Chief Complaint  Patient presents with  . Asthma    HPI Janice Ryan, 63 year old female, has new complaint of worsening of her asthma symptoms.  She reports recent increase in shortness of breath and over the past week she has been using oxygen which was prescribed at the time of her hospital discharge on 07/31/2018 due to an asthma exacerbation.  Patient reports that she feels that she should go to the emergency department today regarding her asthma.  She states that she was prescribed Advair recently but has not picked up the medication from the pharmacy.  She would like a refill of albuterol nebulizer solution as she has had increased use of her nebulizer this week and is now out of the medication.  Patient also feels that prednisone would help with her current asthma exacerbation and she would like to have this medicine called in for her.  She does have complaint of sensation of chest congestion and wheezing.  She denies any significant cough.  No fever or chills.  No known COVID-19 exposures.       She also reports that for about 2 weeks she has had severe pain in both heels which has  affected her ability to walk.  Pain has ranged between an 8 to a 10 or higher on a 0-to-10 scale.  She believes that she may have plantar fasciitis and she would like a prescription for tramadol.  She also continues to have chronic pain in her right knee.  She reports that she does have an upcoming orthopedic appointment this Tuesday in follow-up of her right knee pain and she will ask the orthopedic doctor to also address her foot pain.  She however believes that she needs a refill of tramadol until she can be seen by orthopedics.  She does not wish to mention her foot pain when she goes to the emergency department today regarding her asthma.          She also reports that she has had longstanding issues with anxiety and before moving to this area she was on Xanax as well as on zolpidem and Adderall.  She feels that she needs to restart medication to help with anxiety.  She feels as if she is constantly anxious and worried.  She denies any suicidal thoughts or ideations.  She believes that she has taken BuSpar in the past to help with anxiety and she feels that this medication did work.  She may have also been on Zoloft but she does not believe that this helped her anxiety.   Past Medical History:  Diagnosis Date  . Asthma   . Hypertension   . Morbid obesity ([place])     Past Surgical History:  Procedure Laterality Date  .  HERNIA REPAIR    . JOINT REPLACEMENT      Social History   Socioeconomic History  . Marital status: Married    Spouse name: Not on file  . Number of children: Not on file  . Years of education: Not on file  . Highest education level: Not on file  Occupational History  . Not on file  Social Needs  . Financial resource strain: Not on file  . Food insecurity    Worry: Not on file    Inability: Not on file  . Transportation needs    Medical: Not on file    Non-medical: Not on file  Tobacco Use  . Smoking status: Never Smoker  . Smokeless tobacco: Never Used    Substance and Sexual Activity  . Alcohol use: Never    Frequency: Never  . Drug use: Never  . Sexual activity: Not on file  Lifestyle  . Physical activity    Days per week: Not on file    Minutes per session: Not on file  . Stress: Not on file  Relationships  . Social Musicianconnections    Talks on phone: Not on file    Gets together: Not on file    Attends religious service: Not on file    Active member of club or organization: Not on file    Attends meetings of clubs or organizations: Not on file    Relationship status: Not on file  . Intimate partner violence    Fear of current or ex partner: Not on file    Emotionally abused: Not on file    Physically abused: Not on file    Forced sexual activity: Not on file  Other Topics Concern  . Not on file  Social History Narrative  . Not on file    Outpatient Medications Prior to Visit  Medication Sig Dispense Refill  . acetaminophen (TYLENOL) 500 MG tablet Take 1,000 mg by mouth every 6 (six) hours as needed for headache (pain).    Marland Kitchen. albuterol (PROAIR HFA) 108 (90 Base) MCG/ACT inhaler Inhale 2 puffs into the lungs every 6 (six) hours as needed for wheezing or shortness of breath. 18 g 3  . ALPRAZolam (XANAX) 0.5 MG tablet Take 0.5 mg by mouth daily.    Marland Kitchen. amphetamine-dextroamphetamine (ADDERALL) 10 MG tablet Take 10 mg by mouth 2 (two) times a day.    . Cholecalciferol (VITAMIN D3 PO) Take 1 tablet by mouth daily.    . DULoxetine (CYMBALTA) 60 MG capsule Take 1 capsule (60 mg total) by mouth daily. 30 capsule 1  . Ferrous Sulfate (IRON PO) Take 1 tablet by mouth every other day.    . Fluticasone-Salmeterol (ADVAIR) 500-50 MCG/DOSE AEPB Inhale 1 puff into the lungs 2 (two) times daily. 60 each 1  . furosemide (LASIX) 20 MG tablet Take 1 tablet (20 mg total) by mouth daily. 30 tablet 1  . gabapentin (NEURONTIN) 300 MG capsule Take 1 capsule (300 mg total) by mouth 3 (three) times daily. 90 capsule 1  . ibuprofen (ADVIL) 800 MG tablet  Take 1 tablet (800 mg total) by mouth every 8 (eight) hours as needed. 21 tablet 0  . montelukast (SINGULAIR) 10 MG tablet TAKE ONE TABLET BY MOUTH EVERY NIGHT AT BEDTIME 30 tablet 0  . Multiple Vitamin (MULTIVITAMIN WITH MINERALS) TABS tablet Take 1 tablet by mouth daily.    . OXYGEN Inhale into the lungs at bedtime as needed (2 L).    . zolpidem (AMBIEN)  5 MG tablet Take 5 mg by mouth at bedtime as needed for sleep.    Marland Kitchen lisinopril-hydrochlorothiazide (ZESTORETIC) 10-12.5 MG tablet TAKE ONE TABLET BY MOUTH DAILY 30 tablet 0  . predniSONE (DELTASONE) 20 MG tablet 2 pills daily x 2 days, 1 pill daily x 2 days then 1/2 pill daily for 4 days (Patient not taking: Reported on 11/25/2018) 8 tablet 0  . predniSONE (DELTASONE) 20 MG tablet Take 1 tablet (20 mg total) by mouth 2 (two) times daily with a meal. 10 tablet 0   No facility-administered medications prior to visit.     No Known Allergies  ROS Review of Systems  Constitutional: Positive for fatigue. Negative for chills and fever.  HENT: Negative for sore throat and trouble swallowing.   Respiratory: Positive for chest tightness, shortness of breath and wheezing.   Cardiovascular: Negative for chest pain and palpitations.  Gastrointestinal: Negative for abdominal pain, constipation, diarrhea and nausea.  Endocrine: Negative for polydipsia, polyphagia and polyuria.  Genitourinary: Negative for dysuria and frequency.  Musculoskeletal: Positive for arthralgias and gait problem. Negative for joint swelling.  Neurological: Negative for dizziness and headaches.  Hematological: Negative for adenopathy. Does not bruise/bleed easily.  Psychiatric/Behavioral: Positive for sleep disturbance. Negative for self-injury and suicidal ideas. The patient is nervous/anxious.       Objective:   No vital signs obtained and no physical examination as today's visit was conducted by telephone -Patient was able to speak in complete sentences and did not have  to stop speaking due to shortness of breath.  Patient did not appear to be in any acute respiratory distress during today's telemedicine visit  Wt Readings from Last 3 Encounters:  07/31/18 (!) 494 lb (224.1 kg)     Health Maintenance Due  Topic Date Due  . Hepatitis C Screening  [birth year]  . TETANUS/TDAP  07/08/1976  . PAP SMEAR-Modifier  07/09/1978  . MAMMOGRAM  07/09/2007  . COLONOSCOPY  07/09/2007  . INFLUENZA VACCINE  11/02/2018    No results found for: TSH Lab Results  Component Value Date   WBC 9.6 07/27/2018   HGB 11.2 (L) 07/27/2018   HCT 37.9 07/27/2018   MCV 96.7 07/27/2018   PLT 259 07/27/2018   Lab Results  Component Value Date   NA 140 07/31/2018   K 3.9 07/31/2018   CO2 32 07/31/2018   GLUCOSE 98 07/31/2018   BUN 23 07/31/2018   CREATININE 0.96 07/31/2018   BILITOT 0.4 07/27/2018   ALKPHOS 64 07/27/2018   AST 17 07/27/2018   ALT 16 07/27/2018   PROT 7.3 07/27/2018   ALBUMIN 3.6 07/27/2018   CALCIUM 9.9 07/31/2018   ANIONGAP 9 07/31/2018   No results found for: CHOL No results found for: HDL No results found for: [facility] Lab Results  Component Value Date   TRIG 120 07/27/2018   No results found for: CHOLHDL No results found for: SXJD5Z    Assessment & Plan:  1. Moderate persistent asthma with acute exacerbation Patient with complaint of increased asthma symptoms and reports that she has had to use the oxygen that was prescribed at the time of her last hospital discharge to help with her shortness of breath over the past week.  As patient has a history of hospitalization secondary to asthma exacerbation, I agree with the patient that she should follow-up at the emergency department today for further evaluation of her asthma.  Patient will also be referred to pulmonology for ongoing follow-up and treatment of her asthma.  Patient is provided with refill of albuterol nebulizer solution at today's visit.  Patient reports that she was prescribed  Advair recently but has never obtain the medication from the pharmacy.  Patient repeatedly requested prescription for prednisone however patient was advised to go to the emergency department for further follow-up of her asthma as she states that she has had to use oxygen this week to help with shortness of breath and she has history of prior hospitalization related to asthma exacerbation. - Ambulatory referral to Pulmonology - albuterol (PROVENTIL) (2.5 MG/3ML) 0.083% nebulizer solution; Take 3 mLs (2.5 mg total) by nebulization every 6 (six) hours as needed for wheezing or shortness of breath.  Dispense: 150 mL; Refill: 11  2. Bilateral foot pain; 3.  Chronic pain of right knee Patient states that she believes that she has plantar fasciitis.  Patient was offered podiatry referral but she states that she has upcoming orthopedic appointment regarding her right knee pain and she will mention her foot pain to the orthopedic doctor at her upcoming visit.  Patient also requests tramadol prescription until she can follow-up with orthopedic doctor next week. - traMADol (ULTRAM) 50 MG tablet; Take 1 tablet (50 mg total) by mouth every 8 (eight) hours as needed for up to 5 days.  Dispense: 15 tablet; Refill: 0  4. GAD (generalized anxiety disorder) Patient with complaint of generalized anxiety.  She believes that she has taken BuSpar in the past.  Prescription provided for BuSpar 5 mg 3 times daily and she has been referred to psychiatry for further evaluation and treatment. - Ambulatory referral to Psychiatry - busPIRone (BUSPAR) 5 MG tablet; Take 1 tablet (5 mg total) by mouth 3 (three) times daily. To treat anxiety  Dispense: 90 tablet; Refill: 1  -I discussed the assessment and treatment plan with the patient and she was provided with an opportunity to ask questions and all questions were answered to her satisfaction.  She agreed with the plan and demonstrated an understanding of the instructions.  She is  aware that she should seek medical attention today at the emergency department secondary to her complaint of asthma exacerbation with shortness of breath and the need for oxygen use at home.  I provided 23 minutes of non-face-to-face time during this encounter   Follow-up: Return in about 4 weeks (around 01/02/2019) for Follow-up after ED visit, new medication.   Cain Saupeammie Domonick Sittner, MD

## 2018-12-06 ENCOUNTER — Other Ambulatory Visit: Payer: Self-pay | Admitting: Pharmacist

## 2018-12-06 DIAGNOSIS — I10 Essential (primary) hypertension: Secondary | ICD-10-CM

## 2018-12-06 MED ORDER — LISINOPRIL-HYDROCHLOROTHIAZIDE 10-12.5 MG PO TABS: 1.0000 | ORAL_TABLET | Freq: Every day | ORAL | 0 refills | Status: DC

## 2018-12-07 ENCOUNTER — Encounter: Payer: Self-pay | Admitting: Family Medicine

## 2018-12-09 NOTE — Progress Notes (Deleted)
   Subjective:    Patient ID: Janice Ryan, female    DOB: October 05, 1955, 63 y.o.   MRN: [id number]  63 y.o.F with asthma here for eval   Asthma Her past medical history is significant for asthma.      Review of Systems     Objective:   Physical Exam        Assessment & Plan:

## 2018-12-10 ENCOUNTER — Encounter: Payer: Self-pay | Admitting: Critical Care Medicine

## 2018-12-10 ENCOUNTER — Ambulatory Visit: Payer: Self-pay | Admitting: Critical Care Medicine

## 2018-12-10 DIAGNOSIS — I10 Essential (primary) hypertension: Secondary | ICD-10-CM | POA: Insufficient documentation

## 2018-12-10 DIAGNOSIS — J9611 Chronic respiratory failure with hypoxia: Secondary | ICD-10-CM | POA: Insufficient documentation

## 2018-12-10 DIAGNOSIS — F411 Generalized anxiety disorder: Secondary | ICD-10-CM | POA: Insufficient documentation

## 2018-12-12 ENCOUNTER — Other Ambulatory Visit: Payer: Self-pay | Admitting: Family Medicine

## 2018-12-12 DIAGNOSIS — J45901 Unspecified asthma with (acute) exacerbation: Secondary | ICD-10-CM

## 2018-12-12 DIAGNOSIS — J441 Chronic obstructive pulmonary disease with (acute) exacerbation: Secondary | ICD-10-CM

## 2018-12-16 ENCOUNTER — Telehealth: Payer: Self-pay | Admitting: *Deleted

## 2018-12-16 ENCOUNTER — Other Ambulatory Visit: Payer: Self-pay | Admitting: Family Medicine

## 2018-12-16 DIAGNOSIS — R52 Pain, unspecified: Secondary | ICD-10-CM

## 2018-12-16 MED ORDER — TRAMADOL HCL 50 MG PO TABS: 50.0000 mg | ORAL_TABLET | Freq: Once | ORAL | 0 refills | Status: AC

## 2018-12-16 NOTE — Telephone Encounter (Signed)
Patient needs to have office visit/urgent care or ED visit in follow-up of her fall and I recently sent her in a few days of tramadol (please check recent note to make sure that I am referring to the correct person). She also should have or recently had follow-up with Orthopedics

## 2018-12-16 NOTE — Telephone Encounter (Signed)
Patient did not answer when ortho called. MA advised patient of needing an ED visit prior to sending note to PCP. MA shared patient states she can not ambulate to ED with Tramadol prescription. Please advise on if PCP will provide tramadol or if patient needs to be reiterated for a 3rd time to report to the ED for evaluation and acute treatment.

## 2018-12-16 NOTE — Telephone Encounter (Signed)
I will send in 1 tramadol so that she can go to ED/Urgent care for further evaluation

## 2018-12-16 NOTE — Telephone Encounter (Signed)
Patient verified DOB Patient had a fall and is requesting a refill for tramadol, patient advised to report to the ED for evaluations. Patient states she is unable to bare weight and needs the medication prior to going to the ED.

## 2018-12-16 NOTE — Telephone Encounter (Signed)
Patient is aware of courtesy fill and shares her husband is on the way home from work and is taking her to the ed. She will do followup once seen.

## 2018-12-16 NOTE — Progress Notes (Signed)
Patient ID: Janice Ryan, female   DOB: 1955-04-28, 63 y.o.   MRN: [id number]   See current phone messages from patient.  Patient left message with complaint of a fall and being unable to bear weight status post fall.  Message was sent to [place] to contact patient to have her go to urgent care or emergency department for further evaluation of injuries due to fall.  Message was sent back from [place] that patient states that she cannot bear weight and therefore cannot go to the emergency department without having tramadol.  Prescription will be sent in for 1 tramadol pill so the patient can go to urgent care or emergency department for further evaluation of injuries which she states occurred secondary to fall.  Additionally, at her most recent visit, she requested tramadol refill and 5-day supply was sent in as patient stated that she needed the medication until her follow-up appointment with orthopedics.  Per CMA, orthopedic states that patient did not keep her recent appointment.

## 2018-12-17 ENCOUNTER — Other Ambulatory Visit: Payer: Self-pay

## 2018-12-17 ENCOUNTER — Emergency Department (HOSPITAL_COMMUNITY): Payer: Self-pay

## 2018-12-17 ENCOUNTER — Emergency Department (HOSPITAL_COMMUNITY)
Admission: EM | Admit: 2018-12-17 | Discharge: 2018-12-17 | Disposition: A | Discharge status: Home / Self Care | Payer: Self-pay | Attending: Emergency Medicine | Admitting: Emergency Medicine

## 2018-12-17 DIAGNOSIS — Y999 Unspecified external cause status: Secondary | ICD-10-CM | POA: Insufficient documentation

## 2018-12-17 DIAGNOSIS — Y929 Unspecified place or not applicable: Secondary | ICD-10-CM | POA: Insufficient documentation

## 2018-12-17 DIAGNOSIS — M25461 Effusion, right knee: Secondary | ICD-10-CM | POA: Insufficient documentation

## 2018-12-17 DIAGNOSIS — Z79899 Other long term (current) drug therapy: Secondary | ICD-10-CM | POA: Insufficient documentation

## 2018-12-17 DIAGNOSIS — S838X1A Sprain of other specified parts of right knee, initial encounter: Secondary | ICD-10-CM

## 2018-12-17 DIAGNOSIS — Z8739 Personal history of other diseases of the musculoskeletal system and connective tissue: Secondary | ICD-10-CM

## 2018-12-17 DIAGNOSIS — W108XXA Fall (on) (from) other stairs and steps, initial encounter: Secondary | ICD-10-CM | POA: Insufficient documentation

## 2018-12-17 DIAGNOSIS — I10 Essential (primary) hypertension: Secondary | ICD-10-CM | POA: Insufficient documentation

## 2018-12-17 DIAGNOSIS — Y9301 Activity, walking, marching and hiking: Secondary | ICD-10-CM | POA: Insufficient documentation

## 2018-12-17 DIAGNOSIS — J45909 Unspecified asthma, uncomplicated: Secondary | ICD-10-CM | POA: Insufficient documentation

## 2018-12-17 MED ORDER — HYDROCODONE-ACETAMINOPHEN 5-325 MG PO TABS: 1.0000 | ORAL_TABLET | ORAL | 0 refills | Status: DC | PRN

## 2018-12-17 MED ORDER — HYDROCODONE-ACETAMINOPHEN 5-325 MG PO TABS
2.0000 | ORAL_TABLET | Freq: Once | ORAL | Status: AC
  Administered 2018-12-17: 2 via ORAL
  Filled 2018-12-17: qty 2

## 2018-12-17 MED ORDER — TRAMADOL HCL 50 MG PO TABS: 50.0000 mg | ORAL_TABLET | Freq: Four times a day (QID) | ORAL | 0 refills | Status: DC | PRN

## 2018-12-17 MED ORDER — GABAPENTIN 300 MG PO CAPS: 300.0000 mg | ORAL_CAPSULE | Freq: Three times a day (TID) | ORAL | 1 refills | Status: DC

## 2018-12-17 NOTE — ED Triage Notes (Signed)
Pt missed a step fell this am and pt fell on her knees onto a cardboard box of clothes.  No LOC, no blood thinners, no deformity.  No now has to use her walker.  Pain 8/10

## 2018-12-17 NOTE — ED Provider Notes (Signed)
[facility] DEPT Provider Note   CSN: [id number] Arrival date & time: 12/17/18  1206     History   Chief Complaint Chief Complaint  Patient presents with  . Fall  . R knee pain    HPI Janice Ryan is a 63 y.o. female.     63 year old female here for mechanical fall just prior to arrival.  Patient states that she has been on stairs and tripped and fell onto her right knee.  Denies any head or neck trauma.  No chest or bone discomfort.  Denies any neck pain.  No foot or ankle discomfort.  Pain at her right knee is characterizes sharp and worse with any weightbearing.  EMS called and patient transported here     Past Medical History:  Diagnosis Date  . Acute respiratory failure with hypoxia ([place])   . Asthma   . Hypertension   . Morbid obesity [facility])     Patient Active Problem List   Diagnosis Date Noted  . Chronic respiratory failure with hypoxia ([place]) 12/10/2018  . HTN (hypertension) 12/10/2018  . GAD (generalized anxiety disorder) 12/10/2018  . Chronic pain of right knee   . Asthma, chronic obstructive, with acute exacerbation ([place]) 07/28/2018    Past Surgical History:  Procedure Laterality Date  . HERNIA REPAIR    . JOINT REPLACEMENT       OB History   No obstetric history on file.      Home Medications    Prior to Admission medications   Medication Sig Start Date End Date Taking? Authorizing Provider  acetaminophen (TYLENOL) 500 MG tablet Take 1,000 mg by mouth every 6 (six) hours as needed for headache (pain).    [provider]  albuterol (PROVENTIL) (2.5 MG/3ML) 0.083% nebulizer solution Take 3 mLs (2.5 mg total) by nebulization every 6 (six) hours as needed for wheezing or shortness of breath. 12/05/18   Fulp, Cammie, MD  albuterol (VENTOLIN HFA) 108 (90 Base) MCG/ACT inhaler INHALE TWO PUFFS BY MOUTH EVERY 6 HOURS AS NEEDED FOR SHORTNESS OF BREATH OR WHEEZING 12/12/18   Fulp, Cammie, MD  ALPRAZolam (XANAX)  0.5 MG tablet Take 0.5 mg by mouth daily.    [provider]  amphetamine-dextroamphetamine (ADDERALL) 10 MG tablet Take 10 mg by mouth 2 (two) times a day.    [provider]  busPIRone (BUSPAR) 5 MG tablet Take 1 tablet (5 mg total) by mouth 3 (three) times daily. To treat anxiety 12/05/18   Fulp, Cammie, MD  Cholecalciferol (VITAMIN D3 PO) Take 1 tablet by mouth daily.    [provider]  DULoxetine (CYMBALTA) 60 MG capsule Take 1 capsule (60 mg total) by mouth daily. 11/25/18   Charlott Rakes, MD  Ferrous Sulfate (IRON PO) Take 1 tablet by mouth every other day.    [provider]  Fluticasone-Salmeterol (ADVAIR) 500-50 MCG/DOSE AEPB Inhale 1 puff into the lungs 2 (two) times daily. 11/25/18 12/25/18  Charlott Rakes, MD  furosemide (LASIX) 20 MG tablet Take 1 tablet (20 mg total) by mouth daily. 11/25/18   Charlott Rakes, MD  gabapentin (NEURONTIN) 300 MG capsule Take 1 capsule (300 mg total) by mouth 3 (three) times daily. 11/25/18   Charlott Rakes, MD  ibuprofen (ADVIL) 800 MG tablet Take 1 tablet (800 mg total) by mouth every 8 (eight) hours as needed. 07/31/18   Kathi Ludwig, MD  lisinopril-hydrochlorothiazide (ZESTORETIC) 10-12.5 MG tablet Take 1 tablet by mouth daily. 12/06/18   Antony Blackbird, MD  montelukast (SINGULAIR) 10 MG tablet TAKE ONE TABLET BY MOUTH EVERY NIGHT AT BEDTIME 11/20/18   Claiborne Rigg, NP  Multiple Vitamin (MULTIVITAMIN WITH MINERALS) TABS tablet Take 1 tablet by mouth daily.    [provider]  OXYGEN Inhale into the lungs at bedtime as needed (2 L).    [provider]  zolpidem (AMBIEN) 5 MG tablet Take 5 mg by mouth at bedtime as needed for sleep.    [provider]    Family History No family history on file.  Social History Social History   Tobacco Use  . Smoking status: Never Smoker  . Smokeless tobacco: Never Used  Substance Use Topics  . Alcohol use: Never    Frequency: Never  . Drug  use: Never     Allergies   Patient has no known allergies.   Review of Systems Review of Systems  All other systems reviewed and are negative.    Physical Exam Updated Vital Signs BP 131/87   Pulse 82   Temp 98.1 F (36.7 C) (Oral)   Resp 16   Ht 1.702 m (5\' 7" )   Wt (!) 154.2 kg   SpO2 97%   BMI 53.25 kg/m   Physical Exam Vitals signs and nursing note reviewed.  Constitutional:      General: She is not in acute distress.    Appearance: Normal appearance. She is well-developed. She is not toxic-appearing.  HENT:     Head: Normocephalic and atraumatic.  Eyes:     General: Lids are normal.     Conjunctiva/sclera: Conjunctivae normal.     Pupils: Pupils are equal, round, and reactive to light.  Neck:     Musculoskeletal: Normal range of motion and neck supple.     Thyroid: No thyroid mass.     Trachea: No tracheal deviation.  Cardiovascular:     Rate and Rhythm: Normal rate and regular rhythm.     Heart sounds: Normal heart sounds. No murmur. No gallop.   Pulmonary:     Effort: Pulmonary effort is normal. No respiratory distress.     Breath sounds: Normal breath sounds. No stridor. No decreased breath sounds, wheezing, rhonchi or rales.  Abdominal:     General: Bowel sounds are normal. There is no distension.     Palpations: Abdomen is soft.     Tenderness: There is no abdominal tenderness. There is no rebound.  Musculoskeletal:        General: No tenderness.     Right knee: She exhibits decreased range of motion and swelling.       Legs:  Skin:    General: Skin is warm and dry.     Findings: No abrasion or rash.  Neurological:     Mental Status: She is alert and oriented to person, place, and time.     GCS: GCS eye subscore is 4. GCS verbal subscore is 5. GCS motor subscore is 6.     Cranial Nerves: No cranial nerve deficit.     Sensory: No sensory deficit.  Psychiatric:        Speech: Speech normal.        Behavior: Behavior normal.      ED  Treatments / Results  Labs (all labs ordered are listed, but only abnormal results are displayed) Labs Reviewed - No data to display  EKG None  Radiology No results found.  Procedures Procedures (including critical care time)  Medications Ordered in ED Medications - No data to  display   Initial Impression / Assessment and Plan / ED Course  I have reviewed the triage vital signs and the nursing notes.  Pertinent labs & imaging results that were available during my care of the patient were reviewed by me and considered in my medical decision making (see chart for details).        Patient medicated for pain here with 2 tablets of Vicodin.  Knee immobilizer given.  Offered crutches and she has deferred.  Will give referral to orthopedics on-call  Final Clinical Impressions(s) / ED Diagnoses   Final diagnoses:  None    ED Discharge Orders    None       Lorre NickAllen, [place] Canty, MD 12/17/18 1443

## 2018-12-17 NOTE — Discharge Instructions (Addendum)
[facility] is the orthopedist that is on-call today.  They are the only ones that we are l allowed to refer you to.

## 2018-12-23 ENCOUNTER — Other Ambulatory Visit: Payer: Self-pay | Admitting: Nurse Practitioner

## 2018-12-23 DIAGNOSIS — J4520 Mild intermittent asthma, uncomplicated: Secondary | ICD-10-CM

## 2018-12-24 ENCOUNTER — Telehealth: Payer: Self-pay | Admitting: Family Medicine

## 2018-12-24 NOTE — Telephone Encounter (Signed)
1) Medication(s) Requested (by name): tramadol 2) Pharm[facility] Choice: [facility] on [address]

## 2018-12-24 NOTE — Telephone Encounter (Signed)
Please notify patient to keep her follow-up tomorrow with Orthopedics and ask for pain medication when she sees her [place]. She has an appointment tomorrow

## 2018-12-25 ENCOUNTER — Ambulatory Visit: Payer: Self-pay | Admitting: Surgery

## 2018-12-25 NOTE — Telephone Encounter (Signed)
Spoke with patient and she stated she went to [name] and she say them today. Per pt she is okay with them filling her Tramadol.

## 2019-01-01 ENCOUNTER — Other Ambulatory Visit: Payer: Self-pay | Admitting: Family Medicine

## 2019-01-01 DIAGNOSIS — I10 Essential (primary) hypertension: Secondary | ICD-10-CM

## 2019-01-02 ENCOUNTER — Other Ambulatory Visit: Payer: Self-pay

## 2019-01-02 ENCOUNTER — Ambulatory Visit: Payer: Self-pay | Attending: Family Medicine | Admitting: Physician Assistant

## 2019-01-02 NOTE — Progress Notes (Signed)
Patient ID: Janice Ryan, female   DOB: 06/07/1955, 63 y.o.   MRN: [id number] Virtual Visit via Telephone Note  I connected with Janice Ryan on 01/02/19 at  9:30 AM EDT by telephone and verified that I am speaking with the correct person using two identifiers.   I discussed the limitations, risks, security and privacy concerns of performing an evaluation and management service by telephone and the availability of in person appointments. I also discussed with the patient that there may be a patient responsible charge related to this service. The patient expressed understanding and agreed to proceed.  Patient location: My Location:  [place] office Persons on the call:    History of Present Illness:    Observations/Objective:   Assessment and Plan:   Follow Up Instructions:    I discussed the assessment and treatment plan with the patient. The patient was provided an opportunity to ask questions and all were answered. The patient agreed with the plan and demonstrated an understanding of the instructions.   The patient was advised to call back or seek an in-person evaluation if the symptoms worsen or if the condition fails to improve as anticipated.  I provided *** minutes of non-face-to-face time during this encounter.   Freeman Caldron, PA-C

## 2019-01-16 ENCOUNTER — Ambulatory Visit: Payer: Self-pay | Attending: Family Medicine | Admitting: Family Medicine

## 2019-01-16 ENCOUNTER — Other Ambulatory Visit: Payer: Self-pay

## 2019-01-16 DIAGNOSIS — M79671 Pain in right foot: Secondary | ICD-10-CM

## 2019-01-16 DIAGNOSIS — F411 Generalized anxiety disorder: Secondary | ICD-10-CM

## 2019-01-16 DIAGNOSIS — G8929 Other chronic pain: Secondary | ICD-10-CM

## 2019-01-16 DIAGNOSIS — M25561 Pain in right knee: Secondary | ICD-10-CM

## 2019-01-16 DIAGNOSIS — J4540 Moderate persistent asthma, uncomplicated: Secondary | ICD-10-CM

## 2019-01-16 NOTE — Progress Notes (Signed)
Med refills   Foot Pain-right side  Hosp. Follow up   Wants referral to orthopedic doctor  Per pt she is taking 14 Ibuprofen daily   Ref to pain management

## 2019-01-16 NOTE — Progress Notes (Signed)
Virtual Visit via Telephone Note  I connected with@ on 01/16/19 at  2:50 PM EDT by telephone and verified that I am speaking with the correct person using two identifiers.   I discussed the limitations, risks, security and privacy concerns of performing an evaluation and management service by telephone and the availability of in person appointments. I also discussed with the patient that there may be a patient responsible charge related to this service. The patient expressed understanding and agreed to proceed.  Patient Location: Home Provider Location: [facility] Others participating in call: none   History of Present Illness:         63 yo female who reports that she was never able to be seen for her foot pain/Orthopedic follow-up because she ended up having an asthma exacerbation and had to go to the ED. She was not able to see her prior asthma doctor because she missed too many visits so she was seen at another pulmonology office. She reports that she also has a rash on her right leg which looks like a rash that she had in the past and she said that after she moved here she was diagnosed with a staph infection and was put on a cream and 45 days of antibiotics by a doctor at [facility].         She also continues to have pain in her right foot and a throbbing sensation in the bottom of her foot which causes her to scream out in pain and last night her neighbors called the police because patient was screaming so loudly due to the pain. She is taking 5 of the 200 mg ibuprofen twice per day and she also takes up to 4,000 mg of Tylenol per day as she has heard that you can take this much without injuring the liver. She has an appointment next week on Wednesday with Orthopedics in follow-up of her right knee pain. She is using a rolling walker. It is the foot pain that is causing her to cry out in pain.         She reports that she was told by the lung doctor that her asthma is not well  controlled. 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 She reports that she has not slept in the past 3 days. She states that she knows that she was supposed to follow-up with mental health but she was not able to contact them and they did not call her.          Per patient she has not had a fever. Patient has a pulse oximeter at home and her pulse oximetry is currently at 88%. It has been as low as 70% within the past 2 days. Per patient, "she has everything that she needs at home to treat her asthma, she just needs pain medication, tramadol so she can get around and go to the ED". She reports that she cannot go today because of her lack of sleep and that her foot pain makes it difficult to press the pedals on her car but admits that her son would take her if she were to ask him.  Per patient, "I know you don;t like to give me tramadol but I need something for the pain so that I  can go to the ED tomorrow morning." Discussed with patient that if her oxygen Sat is 88% that she needs to go now as she may need supplemental oxygen but per patient she also has this at home but has not been using it.    Past Medical History:  Diagnosis Date  . Acute respiratory failure with hypoxia (HCC)   . Asthma   . Hypertension   . Morbid obesity (HCC)     Past Surgical History:  Procedure Laterality Date  . HERNIA REPAIR    . JOINT REPLACEMENT      No family history on file.  Social History   Tobacco Use  . Smoking status: Never Smoker  . Smokeless tobacco: Never Used  Substance Use Topics  . Alcohol use: Never    Frequency: Never  . Drug use: Never     No Known Allergies     Observations/Objective: No vital signs or physical exam conducted as visit was done via telephone  Assessment and Plan: 1. Chronic  pain of right knee; 4.  Right foot pain Patient reports continued chronic pain in the right knee as well as new onset of right foot pain.  She still has not followed up with orthopedics but reports that she has an appointment next Wednesday.  Patient again insists that she needs tramadol in order to be able to function.  Prescription provided for tramadol short-term for 5 days and patient is to keep her follow-up appointment with orthopedics for further evaluation and treatment of her knee and foot pain. - traMADol (ULTRAM) 50 MG tablet; One pill every 8 hours as needed for severe pain  Dispense: 15 tablet; Refill: 0  2. Moderate persistent asthma, unspecified whether complicated Patient with complaint of hypoxia and increased asthma symptoms.  Patient was instructed to go to the emergency department today for further evaluation as she also reports possible Covid exposure.  Stressed to patient that both asthma and Covid infection can be life-threatening and she needs to follow-up for further evaluation and treatment.  She is also encouraged to use her home oxygen at this time until her husband and her son can take her to the emergency department for evaluation.  If she is having acute shortness of breath, difficulty breathing she should go ahead and call 911 for emergency department transport.  3.  GAD-generalized anxiety disorder Patient has endorsed longstanding issues with generalized anxiety but I suspect the patient has a more significant psychiatric illness due to her statements and behavior.  At today's visit, patient did state that she knows that she needs to follow-up with psychiatry.  On review of chart, psychiatry has reached out to patient multiple times and patient has refused to schedule appointments or has not shown up for appointments.  Patient is encouraged to reschedule her psychiatric follow-up as this would likely help with her overall health status.  Patient is failing to  recognize/understand that she has serious health issues such as her asthma with hypoxia that require immediate medical attention and this may be due to mental health issues causing poor judgment and lack of insight.   Follow Up Instructions: Go to the emergency department today for further evaluation; 2-week follow-up   I discussed the assessment and treatment plan with the patient. The patient was provided an opportunity to ask questions and all were answered. The patient agreed with the plan and demonstrated an understanding of the instructions.   The patient was advised to call back or seek an in-person  evaluation if the symptoms worsen or if the condition fails to improve as anticipated.  I provided 24  minutes of non-face-to-face time during this encounter.   Antony Blackbird, MD

## 2019-01-17 MED ORDER — TRAMADOL HCL 50 MG PO TABS: ORAL_TABLET | ORAL | 0 refills | Status: DC

## 2019-01-17 NOTE — Telephone Encounter (Signed)
Patient called stating she is having trouble sleeping and has taken Ambien in the past. Patient would like to know if she can get an Rx for Ambien. Please f/u

## 2019-01-17 NOTE — Telephone Encounter (Signed)
MA contacted patient and left a message. Medical Assistant left message on patient's home and cell voicemail. Voicemail states to give a call back to [place] with [facility] at [phone].

## 2019-01-17 NOTE — Telephone Encounter (Signed)
1) Medication(s) Requested (by name):gabapentin (NEURONTIN) 300 MG capsule [[id number] DULoxetine (CYMBALTA) 60 MG capsule [[id number] antibiotic  Patient is asking for cream for leg   2) [facility] [address]   3) Special Requests: Please call and speak to patient   Approved medications will be sent to the pharmacy, we will reach out if there is an issue.  Requests made after 3pm may not be addressed until the following business day!  If a patient is unsure of the name of the medication(s) please note and ask patient to call back when they are able to provide all info, do not send to responsible party until all information is available!

## 2019-01-17 NOTE — Telephone Encounter (Signed)
Patient needs to follow-up in ED regarding her asthma/COVID exposure and because she is having issues with low oxygen right now would not advise medication for sleep other than melatonin until her oxygen levels improve and she also needs to follow-up with psychiatry. Patient was to go to ED today-please ask if she has done so and where was she seen

## 2019-01-21 ENCOUNTER — Telehealth: Payer: Self-pay | Admitting: Family Medicine

## 2019-01-21 ENCOUNTER — Encounter: Payer: Self-pay | Admitting: Family Medicine

## 2019-01-21 NOTE — Telephone Encounter (Signed)
Spoke with patient and number for [facility] was given to her to call to set up an appt. Informed patient with what provider stated. Per pt she will not be able to go to the ED today due to being really tired and her husband not getting out of work until 6:30. Per pt she will go to the ED tomorrow though. Per pt she will call [place]. Informed patient that [place] tried calling her on several occasion and she did not pick up. Per pt she was busy and there were a lot of people calling her that week and some she just didn't pick up the call. Staff informed patient to please call [place] back to schedule that appt.

## 2019-01-21 NOTE — Telephone Encounter (Signed)
Patient called wanting to speak to someone about her asthma. Please follow up.

## 2019-01-31 ENCOUNTER — Other Ambulatory Visit: Payer: Self-pay | Admitting: Family Medicine

## 2019-01-31 DIAGNOSIS — F411 Generalized anxiety disorder: Secondary | ICD-10-CM

## 2019-02-18 ENCOUNTER — Ambulatory Visit: Payer: Self-pay | Admitting: Family Medicine

## 2019-02-18 NOTE — Progress Notes (Deleted)
    Subjective:    CC: R leg pain (R knee and R foot)  I, Shmuel Girgis, LAT, ATC, am serving as scribe for Dr. Lynne Leader.  HPI: Pt is a 63 y/o female presenting w/ c/o chronic R knee pain and R foot pain.  Pt has seen her PCP for these issues and was most recently prescribed Tramadol for her R foot pain.  Pt ambulates w/ a rolling walker.  She has   Past medical history, Surgical history, Family history not pertinant except as noted below, Social history, Allergies, and medications have been entered into the medical record, reviewed, and no changes needed.   Review of Systems: No headache, visual changes, nausea, vomiting, diarrhea, constipation, dizziness, abdominal pain, skin rash, fevers, chills, night sweats, weight loss, swollen lymph nodes, body aches, joint swelling, muscle aches, chest pain, shortness of breath, mood changes, visual or auditory hallucinations.   Objective:   There were no vitals filed for this visit. General: Well Developed, well nourished, and in no acute distress.  Neuro/Psych: Alert and oriented x3, extra-ocular muscles intact, able to move all 4 extremities, sensation grossly intact. Skin: Warm and dry, no rashes noted.  Respiratory: Not using accessory muscles, speaking in full sentences, trachea midline.  Cardiovascular: Pulses palpable, no extremity edema. Abdomen: Does not appear distended. MSK: ***  Lab and Radiology Results No results found for this or any previous visit (from the past 72 hour(s)). No results found.  Impression and Recommendations:    Assessment and Plan: 63 y.o. female with ***.  PDMP reviewed during this encounter. No orders of the defined types were placed in this encounter.  No orders of the defined types were placed in this encounter.   Discussed warning signs or symptoms. Please see discharge instructions. Patient expresses understanding.   ***

## 2019-02-20 ENCOUNTER — Other Ambulatory Visit: Payer: Self-pay | Admitting: Family Medicine

## 2019-02-20 DIAGNOSIS — F411 Generalized anxiety disorder: Secondary | ICD-10-CM

## 2019-02-20 DIAGNOSIS — Z8739 Personal history of other diseases of the musculoskeletal system and connective tissue: Secondary | ICD-10-CM

## 2019-02-20 DIAGNOSIS — J4520 Mild intermittent asthma, uncomplicated: Secondary | ICD-10-CM

## 2019-03-03 ENCOUNTER — Other Ambulatory Visit: Payer: Self-pay

## 2019-03-03 ENCOUNTER — Ambulatory Visit: Payer: Self-pay | Admitting: Nurse Practitioner

## 2019-03-04 ENCOUNTER — Ambulatory Visit: Payer: Self-pay | Admitting: Family Medicine

## 2019-03-04 NOTE — Progress Notes (Deleted)
    Subjective:    CC: R knee pain  I, Garen Woolbright, LAT, ATC, am serving as scribe for Dr. Lynne Leader.  HPI: Pt is a 63 y/o female presenting w/ c/o R knee pain and swelling since April 2020 when she fell down some stairs and landed on her R knee on a concrete slab at the bottom of the landing.  Pt was seen at the ED on 07/27/18 at which time she had a R knee XR.  She had another fall in September 2020 and had a 2nd knee XR on 12/17/18.  Pt has a hx of a prior R knee TKA in 2014.  She walks w/ a RW and has been prescribed both Tramadol and Norco over the past 7-8 months.  Today, the pt notes  Past medical history, Surgical history, Family history not pertinant except as noted below, Social history, Allergies, and medications have been entered into the medical record, reviewed, and no changes needed.   Review of Systems: No headache, visual changes, nausea, vomiting, diarrhea, constipation, dizziness, abdominal pain, skin rash, fevers, chills, night sweats, weight loss, swollen lymph nodes, body aches, joint swelling, muscle aches, chest pain, shortness of breath, mood changes, visual or auditory hallucinations.   Objective:   There were no vitals filed for this visit. General: Well Developed, well nourished, and in no acute distress.  Neuro/Psych: Alert and oriented x3, extra-ocular muscles intact, able to move all 4 extremities, sensation grossly intact. Skin: Warm and dry, no rashes noted.  Respiratory: Not using accessory muscles, speaking in full sentences, trachea midline.  Cardiovascular: Pulses palpable, no extremity edema. Abdomen: Does not appear distended. MSK: ***  Lab and Radiology Results No results found for this or any previous visit (from the past 72 hour(s)). No results found.  Impression and Recommendations:    Assessment and Plan: 63 y.o. female with ***.  PDMP reviewed during this encounter. No orders of the defined types were placed in this encounter.  No  orders of the defined types were placed in this encounter.   Discussed warning signs or symptoms. Please see discharge instructions. Patient expresses understanding.   ***

## 2019-03-05 ENCOUNTER — Ambulatory Visit: Payer: Self-pay | Admitting: Family Medicine

## 2019-03-05 NOTE — Progress Notes (Deleted)
    Subjective:    CC: R knee pain  I, Trevontae Lindahl, LAT, ATC, am serving as scribe for Dr. Lynne Leader.  HPI: Pt is a 63 y/o female presenting w/ c/o chronic R knee pain x 7-8 months.  She was first seen at the ED after falling down some stairs and hitting her R knee on a concrete slab at the bottom of the landing.  She had a R knee XR on 07/27/18 and again on 12/17/18 after she suffered a second fall and landed on her R knee again.  She has seen her PCP intermittently throughout this time period from April 2020 to present.  She has been prescribed both Tramadol and Norco for her R knee pain and swelling.  She walks w/ a RW due to the R knee pain and swelling.  Today, she notes  Past medical history, Surgical history, Family history not pertinant except as noted below, Social history, Allergies, and medications have been entered into the medical record, reviewed, and no changes needed.   Review of Systems: No headache, visual changes, nausea, vomiting, diarrhea, constipation, dizziness, abdominal pain, skin rash, fevers, chills, night sweats, weight loss, swollen lymph nodes, body aches, joint swelling, muscle aches, chest pain, shortness of breath, mood changes, visual or auditory hallucinations.   Objective:   There were no vitals filed for this visit. General: Well Developed, well nourished, and in no acute distress.  Neuro/Psych: Alert and oriented x3, extra-ocular muscles intact, able to move all 4 extremities, sensation grossly intact. Skin: Warm and dry, no rashes noted.  Respiratory: Not using accessory muscles, speaking in full sentences, trachea midline.  Cardiovascular: Pulses palpable, no extremity edema. Abdomen: Does not appear distended. MSK: ***  Lab and Radiology Results No results found for this or any previous visit (from the past 72 hour(s)). No results found.  Impression and Recommendations:    Assessment and Plan: 63 y.o. female with ***.  PDMP not reviewed  this encounter. No orders of the defined types were placed in this encounter.  No orders of the defined types were placed in this encounter.   Discussed warning signs or symptoms. Please see discharge instructions. Patient expresses understanding.   ***

## 2019-03-06 ENCOUNTER — Encounter: Payer: Self-pay | Admitting: Family Medicine

## 2019-03-06 ENCOUNTER — Telehealth: Payer: Self-pay | Admitting: Family Medicine

## 2019-03-06 ENCOUNTER — Other Ambulatory Visit: Payer: Self-pay

## 2019-03-06 NOTE — Telephone Encounter (Signed)
Patient contacted the office to reschedule her appointment that was a no show from yesterday 12/2, the patient stated that she had an asthma attack. I spoke with Cloyde Reams who cleared her to reschedule for 1 more time. When speaking to the patient I could tell she had labored breathing as she explained that she declined to speak with anyone about the breathing as she sees a doctor for it and it is her asthma.   I explained to the patient that Dr. Georgina Snell will see her 1 more time and will not be able to reschedule again even though it has been due to asthma the past 2 visits missed. The patient stated she understood. I am mailing the no show letters out today containing the 3 dates she has missed.

## 2019-03-10 ENCOUNTER — Ambulatory Visit: Payer: Self-pay | Admitting: Family Medicine

## 2019-03-10 NOTE — Progress Notes (Deleted)
   Subjective:    I'm seeing this patient as a consultation for:  Dr. Chapman Fitch  CC: R knee pain  I, Wendy Poet, LAT, ATC, am serving as scribe for Dr. Lynne Leader.  HPI: Pt is a 63 y/o female presenting w/ c/o R knee pain since April 2020 when she fell down some stairs and landed on her R knee on a concrete slab at the bottom of the landing.  She has been seen by her PCP at least twice since then and suffered another fall on 12/17/18 and went to [facility] ED after her 2nd fall.  She has been ambulating w/ a rolling walker and is taking Tramadol.  Pt has a hx of R TKA in 2014.  She had R knee XRs on 07/27/18 and again on 12/17/18 after her 2 falls.  Currently, the pt notes  Past medical history, Surgical history, Family history not pertinant except as noted below, Social history, Allergies, and medications have been entered into the medical record, reviewed, and no changes needed.   Review of Systems: No headache, visual changes, nausea, vomiting, diarrhea, constipation, dizziness, abdominal pain, skin rash, fevers, chills, night sweats, weight loss, swollen lymph nodes, body aches, joint swelling, muscle aches, chest pain, shortness of breath, mood changes, visual or auditory hallucinations.   Objective:   There were no vitals filed for this visit. General: Well Developed, well nourished, and in no acute distress.  Neuro/Psych: Alert and oriented x3, extra-ocular muscles intact, able to move all 4 extremities, sensation grossly intact. Skin: Warm and dry, no rashes noted.  Respiratory: Not using accessory muscles, speaking in full sentences, trachea midline.  Cardiovascular: Pulses palpable, no extremity edema. Abdomen: Does not appear distended. MSK: ***  Lab and Radiology Results No results found for this or any previous visit (from the past 72 hour(s)). No results found.  Impression and Recommendations:    Assessment and Plan: 63 y.o. female with ***.  PDMP not reviewed this  encounter. No orders of the defined types were placed in this encounter.  No orders of the defined types were placed in this encounter.   Discussed warning signs or symptoms. Please see discharge instructions. Patient expresses understanding.  ***

## 2019-03-13 ENCOUNTER — Telehealth: Payer: Self-pay | Admitting: Family Medicine

## 2019-03-13 NOTE — Telephone Encounter (Signed)
Patient is calling back to reschedule NP appointment. Was not sure if able to reschedule NP appt. Due to no show. Please advise [phone]

## 2019-03-14 NOTE — Telephone Encounter (Signed)
Called pt and left voicemail explaining that we were not able to reschedule her appt with Dr. Georgina Snell, due to her missing so many appts in a row. I explained on the voicemail that if she still wanted to see a sports medicine provider she could contact the [place] office and see if she would be able to get in to see Dr. [name].

## 2019-03-14 NOTE — Telephone Encounter (Signed)
Please contact patient to advise they are not able to reschedule due to no show with Dr. Georgina Snell. Please see other notes regarding no shows if needed.

## 2019-03-14 NOTE — Telephone Encounter (Signed)
She no-showed on me 3x right in a row and rescheduled multiple times. I will no longer see her or allow her to schedule.   I am sorry but I cannot accommodate someone who will not show up for appointments.   Lynne Leader, MD

## 2019-03-14 NOTE — Telephone Encounter (Signed)
Janice Ryan said that you wanted [place] to send a message if this patient wanted to reschedule? If she is not able to reschedule with Dr. Georgina Snell, what would be the best option for her? Thank you!

## 2019-03-17 ENCOUNTER — Telehealth: Payer: Self-pay | Admitting: *Deleted

## 2019-03-17 NOTE — Telephone Encounter (Signed)
Patient called wanting to speak with a nurse about her cellulitis although she already scheduled an appt with provider for 03-18-19. Staff called patient back and patient did not pick up and staff [facility] and office number provided on voicemail.

## 2019-03-18 ENCOUNTER — Ambulatory Visit: Payer: Self-pay | Attending: Nurse Practitioner | Admitting: Family Medicine

## 2019-03-18 ENCOUNTER — Other Ambulatory Visit: Payer: Self-pay

## 2019-03-18 ENCOUNTER — Encounter: Payer: Self-pay | Admitting: Family Medicine

## 2019-03-18 DIAGNOSIS — L03119 Cellulitis of unspecified part of limb: Secondary | ICD-10-CM

## 2019-03-18 DIAGNOSIS — J4540 Moderate persistent asthma, uncomplicated: Secondary | ICD-10-CM

## 2019-03-18 DIAGNOSIS — R05 Cough: Secondary | ICD-10-CM

## 2019-03-18 DIAGNOSIS — L03116 Cellulitis of left lower limb: Secondary | ICD-10-CM

## 2019-03-18 DIAGNOSIS — L03115 Cellulitis of right lower limb: Secondary | ICD-10-CM

## 2019-03-18 MED ORDER — METHOCARBAMOL 500 MG PO TABS: 500.0000 mg | ORAL_TABLET | Freq: Two times a day (BID) | ORAL | 0 refills | Status: DC

## 2019-03-18 MED ORDER — FLUTICASONE-SALMETEROL 500-50 MCG/DOSE IN AEPB: 1.0000 | INHALATION_SPRAY | Freq: Two times a day (BID) | RESPIRATORY_TRACT | 1 refills | Status: DC

## 2019-03-18 MED ORDER — ALBUTEROL SULFATE HFA 108 (90 BASE) MCG/ACT IN AERS: 2.0000 | INHALATION_SPRAY | Freq: Four times a day (QID) | RESPIRATORY_TRACT | 3 refills | Status: DC | PRN

## 2019-03-18 MED ORDER — CEPHALEXIN 500 MG PO CAPS: 500.0000 mg | ORAL_CAPSULE | Freq: Two times a day (BID) | ORAL | 0 refills | Status: DC

## 2019-03-18 MED FILL — !ADVAIR 500/50 DISKUS: 500-50 | 30 days supply | Qty: 60 | Fill #0

## 2019-03-18 MED FILL — !PROVENTIL HFA 90 MCG INH: 108 (90 BAS | 25 days supply | Qty: 7 | Fill #0

## 2019-03-18 NOTE — Progress Notes (Signed)
Patient has been called and DOB has been verified. Patient has been screened and transferred to PCP to start phone visit.    Patient is having pain in feet up to legs.

## 2019-03-18 NOTE — Progress Notes (Signed)
Virtual Visit via Telephone Note  I connected with Janice Ryan, on 03/18/2019 at 11:17 AM by telephone due to the COVID-19 pandemic and verified that I am speaking with the correct person using two identifiers.   Consent: I discussed the limitations, risks, security and privacy concerns of performing an evaluation and management service by telephone and the availability of in person appointments. I also discussed with the patient that there may be a patient responsible charge related to this service. The patient expressed understanding and agreed to proceed.   Location of Patient: Home  Location of Provider: Clinic   Persons participating in Telemedicine visit: Ovetta Bazzano Farrington-CMA Dr. Margarita Rana     History of Present Illness: 63 year old female patient of Dr. Chapman Fitch seen for an acute visit today.  She has itching, burning in her feet and it feels like she is walking on fire with no relief through the shower and is using tylenol and Ibuprofen with no relief. Symptoms have been present for one and a half weeks and she states she was seen in the ED and given antibiotics with no relief (I do not see any ED notes in her chart). She tells me she went to [facility] and received Amoxicillin 250mg /day She has had no fever but states put her feet and legs are swollen. Requests Tramadol for pain.  States she knows it is cellulitis as she has had this in the past. She also has a cough and informs me she recently had a sinus infection ; is requesting prednisone for her asthma flare.  Review of medications with her indicates she has not been taking Advair as she has been unable to afford it given her lack of medical coverage.  Denies wheezing, dyspnea, chest pain.  Past Medical History:  Diagnosis Date  . Acute respiratory failure with hypoxia ([place])   . Asthma   . Hypertension   . Morbid obesity ([place])    No Known Allergies  Current Outpatient Medications on File Prior  to Visit  Medication Sig Dispense Refill  . albuterol (PROVENTIL) (2.5 MG/3ML) 0.083% nebulizer solution Take 3 mLs (2.5 mg total) by nebulization every 6 (six) hours as needed for wheezing or shortness of breath. 150 mL 11  . albuterol (VENTOLIN HFA) 108 (90 Base) MCG/ACT inhaler INHALE TWO PUFFS BY MOUTH EVERY 6 HOURS AS NEEDED FOR SHORTNESS OF BREATH OR WHEEZING (Patient taking differently: Inhale 2 puffs into the lungs every 6 (six) hours as needed for wheezing or shortness of breath. ) 18 g 2  . ALPRAZolam (XANAX) 0.5 MG tablet Take 0.5 mg by mouth daily.    Marland Kitchen amphetamine-dextroamphetamine (ADDERALL) 10 MG tablet Take 10 mg by mouth 2 (two) times a day.    . busPIRone (BUSPAR) 5 MG tablet TAKE ONE TABLET BY MOUTH THREE TIMES A DAY FOR ANXIETY 90 tablet 0  . Cholecalciferol (VITAMIN D3 PO) Take 1 tablet by mouth daily.    . DULoxetine (CYMBALTA) 60 MG capsule TAKE ONE CAPSULE BY MOUTH DAILY 30 capsule 1  . Ferrous Sulfate (IRON PO) Take 1 tablet by mouth every other day.    . furosemide (LASIX) 20 MG tablet Take 1 tablet (20 mg total) by mouth daily. 30 tablet 1  . gabapentin (NEURONTIN) 300 MG capsule Take 1 capsule (300 mg total) by mouth 3 (three) times daily. 30 capsule 1  . lisinopril-hydrochlorothiazide (ZESTORETIC) 10-12.5 MG tablet TAKE ONE TABLET BY MOUTH DAILY 30 tablet 2  . montelukast (SINGULAIR) 10 MG tablet TAKE ONE  TABLET BY MOUTH EVERY NIGHT AT BEDTIME 90 tablet 0  . Multiple Vitamin (MULTIVITAMIN WITH MINERALS) TABS tablet Take 1 tablet by mouth daily.    . OXYGEN Inhale into the lungs at bedtime as needed (2 L).    . traMADol (ULTRAM) 50 MG tablet One pill every 8 hours as needed for severe pain 15 tablet 0  . zolpidem (AMBIEN) 5 MG tablet Take 5 mg by mouth at bedtime as needed for sleep.    Marland Kitchen Fluticasone-Salmeterol (ADVAIR) 500-50 MCG/DOSE AEPB Inhale 1 puff into the lungs 2 (two) times daily. 60 each 1   No current facility-administered medications on file prior to visit.     Observations/Objective: Awake, alert, oriented x3 Not in acute distress  Assessment and Plan: 1. Moderate persistent asthma without complication Uncontrolled due to inability to afford medications Offered her the option of using the pharmacy in house which will be cost effective - Fluticasone-Salmeterol (ADVAIR) 500-50 MCG/DOSE AEPB; Inhale 1 puff into the lungs 2 (two) times daily.  Dispense: 60 each; Refill: 1 - albuterol (VENTOLIN HFA) 108 (90 Base) MCG/ACT inhaler; Inhale 2 puffs into the lungs every 6 (six) hours as needed for wheezing or shortness of breath.  Dispense: 18 g; Refill: 3  2. Cellulitis of lower extremity, unspecified laterality Advised that if symptoms do not resolve she will need to come in for an in person visit Placed on Keflex Advised that we will be unable to place her on tramadol but Robaxin has been prescribed instead. - cephALEXin (KEFLEX) 500 MG capsule; Take 1 capsule (500 mg total) by mouth 2 (two) times daily.  Dispense: 14 capsule; Refill: 0 - methocarbamol (ROBAXIN) 500 MG tablet; Take 1 tablet (500 mg total) by mouth 2 (two) times daily.  Dispense: 60 tablet; Refill: 0   Follow Up Instructions: Return in about 3 weeks (around 04/08/2019) for chronic medical conditions with PCP.    I discussed the assessment and treatment plan with the patient. The patient was provided an opportunity to ask questions and all were answered. The patient agreed with the plan and demonstrated an understanding of the instructions.   The patient was advised to call back or seek an in-person evaluation if the symptoms worsen or if the condition fails to improve as anticipated.     I provided 15 minutes total of non-face-to-face time during this encounter including median intraservice time, reviewing previous notes, labs, imaging, medications, management and patient verbalized understanding.     Hoy Register, MD, FAAFP. [facility]  [place], [place] [phone]   03/18/2019, 11:17 AM

## 2019-03-20 ENCOUNTER — Encounter (HOSPITAL_COMMUNITY): Payer: Self-pay

## 2019-03-20 ENCOUNTER — Encounter (HOSPITAL_COMMUNITY): Payer: Self-pay | Admitting: *Deleted

## 2019-03-20 ENCOUNTER — Inpatient Hospital Stay (HOSPITAL_COMMUNITY)
Admission: EM | Admit: 2019-03-20 | Discharge: 2019-04-02 | DRG: 602 | Disposition: A | Discharge status: Home / Self Care | Payer: Self-pay | Attending: Internal Medicine | Admitting: Internal Medicine

## 2019-03-20 ENCOUNTER — Other Ambulatory Visit: Payer: Self-pay

## 2019-03-20 DIAGNOSIS — L0390 Cellulitis, unspecified: Secondary | ICD-10-CM | POA: Diagnosis present

## 2019-03-20 DIAGNOSIS — J9611 Chronic respiratory failure with hypoxia: Secondary | ICD-10-CM

## 2019-03-20 DIAGNOSIS — J9621 Acute and chronic respiratory failure with hypoxia: Secondary | ICD-10-CM | POA: Diagnosis present

## 2019-03-20 DIAGNOSIS — I872 Venous insufficiency (chronic) (peripheral): Secondary | ICD-10-CM | POA: Diagnosis present

## 2019-03-20 DIAGNOSIS — I110 Hypertensive heart disease with heart failure: Secondary | ICD-10-CM | POA: Diagnosis present

## 2019-03-20 DIAGNOSIS — F909 Attention-deficit hyperactivity disorder, unspecified type: Secondary | ICD-10-CM | POA: Diagnosis present

## 2019-03-20 DIAGNOSIS — J9811 Atelectasis: Secondary | ICD-10-CM | POA: Diagnosis present

## 2019-03-20 DIAGNOSIS — G9341 Metabolic encephalopathy: Secondary | ICD-10-CM | POA: Diagnosis not present

## 2019-03-20 DIAGNOSIS — Z79899 Other long term (current) drug therapy: Secondary | ICD-10-CM

## 2019-03-20 DIAGNOSIS — F330 Major depressive disorder, recurrent, mild: Secondary | ICD-10-CM | POA: Diagnosis present

## 2019-03-20 DIAGNOSIS — R52 Pain, unspecified: Secondary | ICD-10-CM

## 2019-03-20 DIAGNOSIS — Z6841 Body Mass Index (BMI) 40.0 and over, adult: Secondary | ICD-10-CM

## 2019-03-20 DIAGNOSIS — Z9119 Patient's noncompliance with other medical treatment and regimen: Secondary | ICD-10-CM

## 2019-03-20 DIAGNOSIS — L03115 Cellulitis of right lower limb: Principal | ICD-10-CM

## 2019-03-20 DIAGNOSIS — J45909 Unspecified asthma, uncomplicated: Secondary | ICD-10-CM | POA: Diagnosis present

## 2019-03-20 DIAGNOSIS — I878 Other specified disorders of veins: Secondary | ICD-10-CM | POA: Diagnosis present

## 2019-03-20 DIAGNOSIS — M25561 Pain in right knee: Secondary | ICD-10-CM

## 2019-03-20 DIAGNOSIS — F329 Major depressive disorder, single episode, unspecified: Secondary | ICD-10-CM | POA: Diagnosis present

## 2019-03-20 DIAGNOSIS — I5033 Acute on chronic diastolic (congestive) heart failure: Secondary | ICD-10-CM | POA: Diagnosis not present

## 2019-03-20 DIAGNOSIS — F411 Generalized anxiety disorder: Secondary | ICD-10-CM | POA: Diagnosis present

## 2019-03-20 DIAGNOSIS — L03116 Cellulitis of left lower limb: Secondary | ICD-10-CM

## 2019-03-20 DIAGNOSIS — R0600 Dyspnea, unspecified: Secondary | ICD-10-CM

## 2019-03-20 DIAGNOSIS — I10 Essential (primary) hypertension: Secondary | ICD-10-CM | POA: Diagnosis present

## 2019-03-20 DIAGNOSIS — F32A Depression, unspecified: Secondary | ICD-10-CM | POA: Diagnosis present

## 2019-03-20 DIAGNOSIS — Z9981 Dependence on supplemental oxygen: Secondary | ICD-10-CM

## 2019-03-20 DIAGNOSIS — Y92009 Unspecified place in unspecified non-institutional (private) residence as the place of occurrence of the external cause: Secondary | ICD-10-CM

## 2019-03-20 DIAGNOSIS — E662 Morbid (severe) obesity with alveolar hypoventilation: Secondary | ICD-10-CM | POA: Diagnosis present

## 2019-03-20 DIAGNOSIS — W19XXXA Unspecified fall, initial encounter: Secondary | ICD-10-CM | POA: Diagnosis present

## 2019-03-20 DIAGNOSIS — Z20828 Contact with and (suspected) exposure to other viral communicable diseases: Secondary | ICD-10-CM | POA: Diagnosis present

## 2019-03-20 DIAGNOSIS — J9622 Acute and chronic respiratory failure with hypercapnia: Secondary | ICD-10-CM | POA: Diagnosis not present

## 2019-03-20 DIAGNOSIS — Z5320 Procedure and treatment not carried out because of patient's decision for unspecified reasons: Secondary | ICD-10-CM | POA: Diagnosis not present

## 2019-03-20 DIAGNOSIS — Z8249 Family history of ischemic heart disease and other diseases of the circulatory system: Secondary | ICD-10-CM

## 2019-03-20 DIAGNOSIS — M797 Fibromyalgia: Secondary | ICD-10-CM | POA: Diagnosis present

## 2019-03-20 DIAGNOSIS — K859 Acute pancreatitis without necrosis or infection, unspecified: Secondary | ICD-10-CM | POA: Diagnosis present

## 2019-03-20 DIAGNOSIS — M1711 Unilateral primary osteoarthritis, right knee: Secondary | ICD-10-CM | POA: Diagnosis present

## 2019-03-20 LAB — CBC WITH DIFFERENTIAL/PLATELET
Abs Immature Granulocytes: 0.04 10*3/uL (ref 0.00–0.07)
Basophils Absolute: 0 10*3/uL (ref 0.0–0.1)
Basophils Relative: 0 %
Eosinophils Absolute: 0.1 10*3/uL (ref 0.0–0.5)
Eosinophils Relative: 1 %
HCT: 35.9 % — ABNORMAL LOW (ref 36.0–46.0)
Hemoglobin: 10.7 g/dL — ABNORMAL LOW (ref 12.0–15.0)
Immature Granulocytes: 0 %
Lymphocytes Relative: 17 %
Lymphs Abs: 1.8 10*3/uL (ref 0.7–4.0)
MCH: 29.4 pg (ref 26.0–34.0)
MCHC: 29.8 g/dL — ABNORMAL LOW (ref 30.0–36.0)
MCV: 98.6 fL (ref 80.0–100.0)
Monocytes Absolute: 1 10*3/uL (ref 0.1–1.0)
Monocytes Relative: 9 %
Neutro Abs: 7.9 10*3/uL — ABNORMAL HIGH (ref 1.7–7.7)
Neutrophils Relative %: 73 %
Platelets: 259 10*3/uL (ref 150–400)
RBC: 3.64 MIL/uL — ABNORMAL LOW (ref 3.87–5.11)
RDW: 13.1 % (ref 11.5–15.5)
WBC: 10.9 10*3/uL — ABNORMAL HIGH (ref 4.0–10.5)
nRBC: 0 % (ref 0.0–0.2)

## 2019-03-20 LAB — BASIC METABOLIC PANEL
Anion gap: 8 (ref 5–15)
BUN: 18 mg/dL (ref 8–23)
CO2: 37 mmol/L — ABNORMAL HIGH (ref 22–32)
Calcium: 11.3 mg/dL — ABNORMAL HIGH (ref 8.9–10.3)
Chloride: 94 mmol/L — ABNORMAL LOW (ref 98–111)
Creatinine, Ser: 0.93 mg/dL (ref 0.44–1.00)
GFR calc Af Amer: >60 mL/min (ref >60)
GFR calc non Af Amer: >60 mL/min (ref >60)
Glucose, Bld: 107 mg/dL — ABNORMAL HIGH (ref 70–99)
Potassium: 4.6 mmol/L (ref 3.5–5.1)
Sodium: 139 mmol/L (ref 135–145)

## 2019-03-20 LAB — SARS CORONAVIRUS 2 (TAT 6-24 HRS): SARS Coronavirus 2: NEGATIVE

## 2019-03-20 MED ORDER — HYDROCHLOROTHIAZIDE 12.5 MG PO CAPS
12.5000 mg | ORAL_CAPSULE | Freq: Every day | ORAL | Status: DC
  Administered 2019-03-21: 12.5 mg via ORAL
  Filled 2019-03-20 (×2): qty 1

## 2019-03-20 MED ORDER — GABAPENTIN 300 MG PO CAPS
300.0000 mg | ORAL_CAPSULE | Freq: Once | ORAL | Status: AC
  Administered 2019-03-20: 300 mg via ORAL
  Filled 2019-03-20: qty 1

## 2019-03-20 MED ORDER — ALBUTEROL SULFATE (2.5 MG/3ML) 0.083% IN NEBU
2.5000 mg | INHALATION_SOLUTION | RESPIRATORY_TRACT | Status: DC | PRN
  Administered 2019-03-21 – 2019-04-02 (×4): 2.5 mg via RESPIRATORY_TRACT
  Filled 2019-03-20 (×4): qty 3

## 2019-03-20 MED ORDER — ALBUTEROL SULFATE HFA 108 (90 BASE) MCG/ACT IN AERS: 2.0000 | INHALATION_SPRAY | Freq: Four times a day (QID) | RESPIRATORY_TRACT | Status: DC | PRN

## 2019-03-20 MED ORDER — ACETAMINOPHEN 325 MG PO TABS
650.0000 mg | ORAL_TABLET | Freq: Four times a day (QID) | ORAL | Status: DC | PRN
  Administered 2019-03-21 – 2019-04-02 (×14): 650 mg via ORAL
  Filled 2019-03-20 (×15): qty 2

## 2019-03-20 MED ORDER — MOMETASONE FURO-FORMOTEROL FUM 200-5 MCG/ACT IN AERO
2.0000 | INHALATION_SPRAY | Freq: Two times a day (BID) | RESPIRATORY_TRACT | Status: DC
  Administered 2019-03-21 – 2019-03-26 (×8): 2 via RESPIRATORY_TRACT
  Filled 2019-03-20 (×2): qty 8.8

## 2019-03-20 MED ORDER — SODIUM CHLORIDE 0.9 % IV SOLN
2.0000 g | Freq: Every day | INTRAVENOUS | Status: DC
  Administered 2019-03-20 – 2019-03-24 (×5): 2 g via INTRAVENOUS
  Filled 2019-03-20: qty 2
  Filled 2019-03-20: qty 20
  Filled 2019-03-20: qty 2
  Filled 2019-03-20: qty 20
  Filled 2019-03-20 (×2): qty 2

## 2019-03-20 MED ORDER — BUSPIRONE HCL 5 MG PO TABS
5.0000 mg | ORAL_TABLET | Freq: Three times a day (TID) | ORAL | Status: DC
  Administered 2019-03-20 – 2019-04-02 (×36): 5 mg via ORAL
  Filled 2019-03-20 (×38): qty 1

## 2019-03-20 MED ORDER — VANCOMYCIN HCL 2000 MG/400ML IV SOLN
2000.0000 mg | Freq: Once | INTRAVENOUS | Status: AC
  Administered 2019-03-20: 2000 mg via INTRAVENOUS
  Filled 2019-03-20: qty 400

## 2019-03-20 MED ORDER — GABAPENTIN 300 MG PO CAPS
300.0000 mg | ORAL_CAPSULE | Freq: Three times a day (TID) | ORAL | Status: DC
  Administered 2019-03-20: 300 mg via ORAL
  Filled 2019-03-20: qty 1

## 2019-03-20 MED ORDER — LISINOPRIL 10 MG PO TABS
10.0000 mg | ORAL_TABLET | Freq: Every day | ORAL | Status: DC
  Administered 2019-03-21 – 2019-03-27 (×6): 10 mg via ORAL
  Filled 2019-03-20 (×9): qty 1

## 2019-03-20 MED ORDER — MORPHINE SULFATE (PF) 4 MG/ML IV SOLN
4.0000 mg | Freq: Once | INTRAVENOUS | Status: AC
  Administered 2019-03-20: 4 mg via INTRAVENOUS
  Filled 2019-03-20: qty 1

## 2019-03-20 MED ORDER — ENOXAPARIN SODIUM 80 MG/0.8ML ~~LOC~~ SOLN
80.0000 mg | Freq: Every day | SUBCUTANEOUS | Status: DC
  Administered 2019-03-20 – 2019-04-01 (×13): 80 mg via SUBCUTANEOUS
  Filled 2019-03-20 (×15): qty 0.8

## 2019-03-20 MED ORDER — LISINOPRIL-HYDROCHLOROTHIAZIDE 10-12.5 MG PO TABS: 1.0000 | ORAL_TABLET | Freq: Every day | ORAL | Status: DC

## 2019-03-20 MED ORDER — ACETAMINOPHEN 650 MG RE SUPP: 650.0000 mg | Freq: Four times a day (QID) | RECTAL | Status: DC | PRN

## 2019-03-20 MED ORDER — DULOXETINE HCL 60 MG PO CPEP
60.0000 mg | ORAL_CAPSULE | Freq: Every day | ORAL | Status: DC
  Administered 2019-03-21: 60 mg via ORAL
  Filled 2019-03-20: qty 1

## 2019-03-20 MED ORDER — ALBUTEROL SULFATE HFA 108 (90 BASE) MCG/ACT IN AERS
2.0000 | INHALATION_SPRAY | Freq: Once | RESPIRATORY_TRACT | Status: AC
  Administered 2019-03-20: 2 via RESPIRATORY_TRACT
  Filled 2019-03-20: qty 6.7

## 2019-03-20 MED ORDER — POTASSIUM CHLORIDE CRYS ER 10 MEQ PO TBCR
10.0000 meq | EXTENDED_RELEASE_TABLET | Freq: Once | ORAL | Status: AC
  Administered 2019-03-20: 10 meq via ORAL
  Filled 2019-03-20: qty 1

## 2019-03-20 MED ORDER — LORAZEPAM 2 MG/ML IJ SOLN
1.0000 mg | Freq: Once | INTRAMUSCULAR | Status: AC
  Administered 2019-03-20: 1 mg via INTRAVENOUS
  Filled 2019-03-20: qty 1

## 2019-03-20 MED ORDER — MONTELUKAST SODIUM 10 MG PO TABS
10.0000 mg | ORAL_TABLET | Freq: Every day | ORAL | Status: DC
  Administered 2019-03-20 – 2019-03-25 (×6): 10 mg via ORAL
  Filled 2019-03-20 (×6): qty 1

## 2019-03-20 MED ORDER — FUROSEMIDE 10 MG/ML IJ SOLN
40.0000 mg | Freq: Once | INTRAMUSCULAR | Status: AC
  Administered 2019-03-20: 40 mg via INTRAVENOUS
  Filled 2019-03-20: qty 4

## 2019-03-20 NOTE — ED Triage Notes (Signed)
EMS states pt has a doctor's appointment tomorrow for her feet burning, the pain become so bad she felt she needs to be seen today, also reports feeling SHOB when up. 96 % 3 L at home, [id number]

## 2019-03-20 NOTE — Progress Notes (Signed)
A consult was received from an ED physician for vancomycin per pharmacy dosing.  The patient's profile has been reviewed for ht/wt/allergies/indication/available labs.    A one time order has been placed for vancomycin 2000 mg IV once.  Further antibiotics/pharmacy consults should be ordered by admitting physician if indicated.       Thank you, Lenis Noon, PharmD 03/20/2019  4:42 PM

## 2019-03-20 NOTE — ED Notes (Addendum)
It was brought to my attention that pt O2 sats had fallen to the 70's by lilli EMT while pt was receiving oxygen at 5l high flow [address] Posey Pronto at bedside observed pt sat and stated pending COVID test pt might be placed on C-PAP .

## 2019-03-20 NOTE — ED Notes (Signed)
Patient said she feels weak all over her body.

## 2019-03-20 NOTE — ED Provider Notes (Signed)
[facility]-EMERGENCY DEPT Provider Note   CSN: [id number] Arrival date & time: 03/20/19  1416     History Chief Complaint  Patient presents with  . Feet Burning Sensation    Janice Ryan is a 63 y.o. female.  HPI  63yF with burning and swelling in feet/lower extremities. Ongoing for weeks. She feels like it is progressively worsening. Constant pin. She feels like her feet are on fire. Increasing swelling in her legs and now up to abdomen. Redness in lower legs/feet. No fever or chills. Has been on antibiotics w/o improvement. No acute respiratory complaints.   Past Medical History:  Diagnosis Date  . Acute respiratory failure with hypoxia ([facility])   . Asthma   . Hypertension   . Morbid obesity [facility](HCC)    Patient Active Problem List   Diagnosis Date Noted  . Chronic respiratory failure with hypoxia ([facility]) 12/10/2018  . HTN (hypertension) 12/10/2018  . GAD (generalized anxiety disorder) 12/10/2018  . Chronic pain of right knee   . Asthma, chronic obstructive, with acute exacerbation ([facility]) 07/28/2018    Past Surgical History:  Procedure Laterality Date  . HERNIA REPAIR    . JOINT REPLACEMENT       OB History   No obstetric history on file.     No family history on file.  Social History   Tobacco Use  . Smoking status: Never Smoker  . Smokeless tobacco: Never Used  Substance Use Topics  . Alcohol use: Never  . Drug use: Never    Home Medications Prior to Admission medications   Medication Sig Start Date End Date Taking? Authorizing Provider  albuterol (PROVENTIL) (2.5 MG/3ML) 0.083% nebulizer solution Take 3 mLs (2.5 mg total) by nebulization every 6 (six) hours as needed for wheezing or shortness of breath. 12/05/18  Yes Fulp, Cammie, MD  busPIRone (BUSPAR) 5 MG tablet TAKE ONE TABLET BY MOUTH THREE TIMES A DAY FOR ANXIETY Patient taking differently: Take 5 mg by mouth 3 (three) times daily.  02/21/19  Yes Fulp, Cammie, MD    Cholecalciferol (VITAMIN D3 PO) Take 1 tablet by mouth daily.   Yes [provider]  Ferrous Sulfate (IRON PO) Take 1 tablet by mouth every other day.   Yes [provider]  furosemide (LASIX) 20 MG tablet Take 1 tablet (20 mg total) by mouth daily. 11/25/18  Yes Hoy RegisterNewlin, Enobong, MD  gabapentin (NEURONTIN) 300 MG capsule Take 1 capsule (300 mg total) by mouth 3 (three) times daily. 12/17/18  Yes Lorre NickAllen, Anthony, MD  lisinopril-hydrochlorothiazide (ZESTORETIC) 10-12.5 MG tablet TAKE ONE TABLET BY MOUTH DAILY Patient taking differently: Take 1 tablet by mouth daily.  01/02/19  Yes Fulp, Cammie, MD  montelukast (SINGULAIR) 10 MG tablet TAKE ONE TABLET BY MOUTH EVERY NIGHT AT BEDTIME 02/20/19  Yes Fulp, Cammie, MD  Multiple Vitamin (MULTIVITAMIN WITH MINERALS) TABS tablet Take 1 tablet by mouth daily.   Yes [provider]  albuterol (VENTOLIN HFA) 108 (90 Base) MCG/ACT inhaler Inhale 2 puffs into the lungs every 6 (six) hours as needed for wheezing or shortness of breath. 03/18/19   Hoy RegisterNewlin, Enobong, MD  cephALEXin (KEFLEX) 500 MG capsule Take 1 capsule (500 mg total) by mouth 2 (two) times daily. 03/18/19   Hoy RegisterNewlin, Enobong, MD  DULoxetine (CYMBALTA) 60 MG capsule TAKE ONE CAPSULE BY MOUTH DAILY Patient taking differently: Take 60 mg by mouth daily.  02/20/19   Fulp, Cammie, MD  Fluticasone-Salmeterol (ADVAIR) 500-50 MCG/DOSE AEPB Inhale 1 puff into the lungs  2 (two) times daily. 03/18/19 04/17/19  Hoy Register, MD  methocarbamol (ROBAXIN) 500 MG tablet Take 1 tablet (500 mg total) by mouth 2 (two) times daily. 03/18/19   Hoy Register, MD  OXYGEN Inhale into the lungs at bedtime as needed (2 L).    [provider]  traMADol (ULTRAM) 50 MG tablet One pill every 8 hours as needed for severe pain Patient not taking: Reported on 03/20/2019 01/17/19   Cain Saupe, MD    Allergies    Patient has no known allergies.  Review of Systems   Review of Systems All  systems reviewed and negative, other than as noted in HPI.   Physical Exam Updated Vital Signs BP (!) 151/64   Pulse 99   Temp 99 F (37.2 C) (Oral)   Resp 17   Ht 5\' 8"  (1.727 m)   Wt (!) 167.8 kg   SpO2 97%   BMI 56.26 kg/m   Physical Exam Vitals and nursing note reviewed.  Constitutional:      General: She is not in acute distress.    Appearance: She is well-developed. She is obese.  HENT:     Head: Normocephalic and atraumatic.  Eyes:     General:        Right eye: No discharge.        Left eye: No discharge.     Conjunctiva/sclera: Conjunctivae normal.  Cardiovascular:     Rate and Rhythm: Normal rate and regular rhythm.     Heart sounds: Normal heart sounds. No murmur. No friction rub. No gallop.   Pulmonary:     Effort: Pulmonary effort is normal. No respiratory distress.     Breath sounds: Normal breath sounds.  Abdominal:     General: There is no distension.     Palpations: Abdomen is soft.     Tenderness: There is no abdominal tenderness.  Musculoskeletal:        General: Tenderness present.     Cervical back: Neck supple.     Right lower leg: Edema present.     Left lower leg: Edema present.     Comments: Exam is difficult given patient's body habitus.  She does appear to have significant edema bilateral lower extremities.  She has erythema extending from both feet up to about mid shin bilaterally.  Tender to touch.  No palpable cords.  Skin:    General: Skin is warm and dry.  Neurological:     Mental Status: She is alert.  Psychiatric:        Behavior: Behavior normal.        Thought Content: Thought content normal.     ED Results / Procedures / Treatments   Labs (all labs ordered are listed, but only abnormal results are displayed) Labs Reviewed  CBC WITH DIFFERENTIAL/PLATELET - Abnormal; Notable for the following components:      Result Value   WBC 10.9 (*)    RBC 3.64 (*)    Hemoglobin 10.7 (*)    HCT 35.9 (*)    MCHC 29.8 (*)    Neutro  Abs 7.9 (*)    All other components within normal limits  BASIC METABOLIC PANEL - Abnormal; Notable for the following components:   Chloride 94 (*)    CO2 37 (*)    Glucose, Bld 107 (*)    Calcium 11.3 (*)    All other components within normal limits  CBC - Abnormal; Notable for the following components:   WBC 11.1 (*)  RBC 3.84 (*)    Hemoglobin 11.1 (*)    MCV 100.8 (*)    MCHC 28.7 (*)    All other components within normal limits  BASIC METABOLIC PANEL - Abnormal; Notable for the following components:   Chloride 95 (*)    CO2 39 (*)    Glucose, Bld 140 (*)    Creatinine, Ser 1.03 (*)    Calcium 10.7 (*)    GFR calc non Af Amer 59 (*)    All other components within normal limits  CBC - Abnormal; Notable for the following components:   WBC 16.0 (*)    Hemoglobin 11.3 (*)    MCHC 29.6 (*)    All other components within normal limits  BASIC METABOLIC PANEL - Abnormal; Notable for the following components:   Chloride 90 (*)    CO2 37 (*)    Glucose, Bld 123 (*)    Calcium 10.6 (*)    All other components within normal limits  D-DIMER, QUANTITATIVE (NOT AT [facility]) - Abnormal; Notable for the following components:   D-Dimer, Quant 4.59 (*)    All other components within normal limits  LIPASE, BLOOD - Abnormal; Notable for the following components:   Lipase 189 (*)    All other components within normal limits  CBC - Abnormal; Notable for the following components:   WBC 16.7 (*)    Hemoglobin 11.2 (*)    MCHC 28.7 (*)    All other components within normal limits  BASIC METABOLIC PANEL - Abnormal; Notable for the following components:   Chloride 90 (*)    CO2 37 (*)    Calcium 10.6 (*)    All other components within normal limits  LIPASE, BLOOD - Abnormal; Notable for the following components:   Lipase 108 (*)    All other components within normal limits  SARS CORONAVIRUS 2 (TAT 6-24 HRS)  BRAIN NATRIURETIC PEPTIDE  LIPID PANEL  CBC  BASIC METABOLIC PANEL     EKG None  Radiology No results found.  Procedures Procedures (including critical care time)  Medications Ordered in ED Medications - No data to display  ED Course  I have reviewed the triage vital signs and the nursing notes.  Pertinent labs & imaging results that were available during my care of the patient were reviewed by me and considered in my medical decision making (see chart for details).    MDM Rules/Calculators/A&P                       63yF with what I suspect is lower extremity cellulitis.  Exam is very limited by body habitus though.  There may be some component of venous insufficiency.  Consider DVT although I feel that this is a lot less likely.  Unfortunately, ultrasound is not available at this time.  Can be pursued as an inpatient if admitting team feels it is warranted.  Hospitalist service consulted. Final Clinical Impression(s) / ED Diagnoses Final diagnoses:  Cellulitis of leg, left  Cellulitis of leg, right    Rx / DC Orders ED Discharge Orders    None       Virgel Manifold, MD 03/23/19 [phone]

## 2019-03-20 NOTE — H&P (Signed)
History and Physical    Janice Ryan OVF:[id number] DOB: January 14, 1956 DOA: 03/20/2019  PCP: Antony Blackbird, MD  Patient coming from: Home  I have personally briefly reviewed patient's old medical records in [facility]  Chief Complaint: Pain in feet  HPI: Janice Ryan is a 63 y.o. female with medical history significant for asthma with chronic respiratory failure on 2 L supplemental O2 at home, hypertension, generalized anxiety disorder, and morbid obesity who presents to the ED for evaluation of erythema and pain on both of her lower extremities.  Unable to obtain history from patient at time of admission due to her hypersomnolence therefore entirety of history is obtained from EDP and chart review.  Patient had a telemedicine visit with her PCP on 03/18/2019 for symptoms of itching and burning in both of her feet which has been ongoing for 1.5 weeks.  She was reportedly seen in the ED and given antibiotics amoxicillin 250 mg/day.  She felt that her reported cellulitis was not improving.  Her antibiotics were switched to Keflex however per pharmacy notes she has not yet picked this up.  She came to the ED for further evaluation and management.  ED Course:  Initial vitals showed BP 140/62, pulse 90, RR 17, temp 99.0 Fahrenheit, SPO2 99% on 2 L of O2 via [place].  Labs are notable for WBC 10.9, hemoglobin 10.7, platelets 259,000, sodium 139, potassium 4.6, bicarb 37, BUN 18, creatinine 0.93, serum glucose 107, calcium 11.3.  SARS-CoV-2 PCR test was obtained and pending.  Patient was given IV vancomycin, IV Lasix 40 mg with oral potassium, Ativan, gabapentin, morphine, and albuterol inhaler treatment.  The hospitalist service was consulted to admit for further evaluation and management.  Review of Systems:  Unable to obtain full review of systems due to patient's hypersomnolence.   Past Medical History:  Diagnosis Date  . Acute respiratory failure with hypoxia ([place])   .  Asthma   . Hypertension   . Morbid obesity ([place])     Past Surgical History:  Procedure Laterality Date  . HERNIA REPAIR    . JOINT REPLACEMENT      Social History:  reports that she has never smoked. She has never used smokeless tobacco. She reports that she does not drink alcohol or use drugs.  No Known Allergies  Family History  Problem Relation Age of Onset  . Atrial fibrillation Mother   . Congestive Heart Failure Father      Prior to Admission medications   Medication Sig Start Date End Date Taking? Authorizing Provider  albuterol (PROVENTIL) (2.5 MG/3ML) 0.083% nebulizer solution Take 3 mLs (2.5 mg total) by nebulization every 6 (six) hours as needed for wheezing or shortness of breath. 12/05/18  Yes Fulp, Cammie, MD  busPIRone (BUSPAR) 5 MG tablet TAKE ONE TABLET BY MOUTH THREE TIMES A DAY FOR ANXIETY Patient taking differently: Take 5 mg by mouth 3 (three) times daily.  02/21/19  Yes Fulp, Cammie, MD  Cholecalciferol (VITAMIN D3 PO) Take 1 tablet by mouth daily.   Yes [provider]  Ferrous Sulfate (IRON PO) Take 1 tablet by mouth every other day.   Yes [provider]  furosemide (LASIX) 20 MG tablet Take 1 tablet (20 mg total) by mouth daily. 11/25/18  Yes Charlott Rakes, MD  gabapentin (NEURONTIN) 300 MG capsule Take 1 capsule (300 mg total) by mouth 3 (three) times daily. 12/17/18  Yes Lacretia Leigh, MD  lisinopril-hydrochlorothiazide (ZESTORETIC) 10-12.5 MG tablet TAKE ONE TABLET BY MOUTH DAILY  Patient taking differently: Take 1 tablet by mouth daily.  01/02/19  Yes Fulp, Cammie, MD  montelukast (SINGULAIR) 10 MG tablet TAKE ONE TABLET BY MOUTH EVERY NIGHT AT BEDTIME 02/20/19  Yes Fulp, Cammie, MD  Multiple Vitamin (MULTIVITAMIN WITH MINERALS) TABS tablet Take 1 tablet by mouth daily.   Yes [provider]  albuterol (VENTOLIN HFA) 108 (90 Base) MCG/ACT inhaler Inhale 2 puffs into the lungs every 6 (six) hours as needed for wheezing or  shortness of breath. 03/18/19   Hoy Register, MD  cephALEXin (KEFLEX) 500 MG capsule Take 1 capsule (500 mg total) by mouth 2 (two) times daily. 03/18/19   Hoy Register, MD  DULoxetine (CYMBALTA) 60 MG capsule TAKE ONE CAPSULE BY MOUTH DAILY Patient taking differently: Take 60 mg by mouth daily.  02/20/19   Fulp, Cammie, MD  Fluticasone-Salmeterol (ADVAIR) 500-50 MCG/DOSE AEPB Inhale 1 puff into the lungs 2 (two) times daily. 03/18/19 04/17/19  Hoy Register, MD  methocarbamol (ROBAXIN) 500 MG tablet Take 1 tablet (500 mg total) by mouth 2 (two) times daily. 03/18/19   Hoy Register, MD  OXYGEN Inhale into the lungs at bedtime as needed (2 L).    [provider]  traMADol (ULTRAM) 50 MG tablet One pill every 8 hours as needed for severe pain Patient not taking: Reported on 03/20/2019 01/17/19   Cain Saupe, MD    Physical Exam: Vitals:   03/20/19 1920 03/20/19 1930 03/20/19 1931 03/20/19 1953  BP:  (!) 161/72  (!) 161/72  Pulse: (!) 102  (!) 39 97  Resp:    17  Temp:      TempSrc:      SpO2: 90%  (!) 86% 91%  Weight:      Height:       Exam limited due to hypersomnolence. Constitutional: Morbidly obese woman resting supine in bed, she is hypersomnolent, snoring, and difficult to arouse Eyes: PERRL ENMT: Mucous membranes are moist. Posterior pharynx clear of any exudate or lesions.Normal dentition.  Neck: normal, supple, no masses. Respiratory: clear to auscultation anteriorly normal respiratory effort. No accessory muscle use.  Cardiovascular: Regular rate and rhythm, no murmurs / rubs / gallops.  Large extremities without pitting edema. Abdomen: no tenderness, no masses palpated. No hepatosplenomegaly. Bowel sounds positive.  Musculoskeletal: Limited exam due to hypersomnolence, moves upper extremities spontaneously Skin: Erythema bilateral lower extremities from feet to halfway up the shin Neurologic: Limited as patient is hypersomnolent, moves upper extremities  spontaneously Psychiatric: Hypersomnolent    Labs on Admission: I have personally reviewed following labs and imaging studies  CBC: Recent Labs  Lab 03/20/19 1724  WBC 10.9*  NEUTROABS 7.9*  HGB 10.7*  HCT 35.9*  MCV 98.6  PLT 259   Basic Metabolic Panel: Recent Labs  Lab 03/20/19 1724  NA 139  K 4.6  CL 94*  CO2 37*  GLUCOSE 107*  BUN 18  CREATININE 0.93  CALCIUM 11.3*   GFR: Estimated Creatinine Clearance: 105.8 mL/min (by C-G formula based on SCr of 0.93 mg/dL). Liver Function Tests: No results for input(s): AST, ALT, ALKPHOS, BILITOT, PROT, ALBUMIN in the last 168 hours. No results for input(s): LIPASE, AMYLASE in the last 168 hours. No results for input(s): AMMONIA in the last 168 hours. Coagulation Profile: No results for input(s): INR, PROTIME in the last 168 hours. Cardiac Enzymes: No results for input(s): CKTOTAL, CKMB, CKMBINDEX, TROPONINI in the last 168 hours. BNP (last 3 results) No results for input(s): PROBNP in the last 8760 hours.  HbA1C: No results for input(s): HGBA1C in the last 72 hours. CBG: No results for input(s): GLUCAP in the last 168 hours. Lipid Profile: No results for input(s): CHOL, HDL, LDLCALC, TRIG, CHOLHDL, LDLDIRECT in the last 72 hours. Thyroid Function Tests: No results for input(s): TSH, T4TOTAL, FREET4, T3FREE, THYROIDAB in the last 72 hours. Anemia Panel: No results for input(s): VITAMINB12, FOLATE, FERRITIN, TIBC, IRON, RETICCTPCT in the last 72 hours. Urine analysis: No results found for: COLORURINE, APPEARANCEUR, LABSPEC, PHURINE, GLUCOSEU, HGBUR, BILIRUBINUR, KETONESUR, PROTEINUR, UROBILINOGEN, NITRITE, LEUKOCYTESUR  Radiological Exams on Admission: No results found.  EKG: Not performed.  Assessment/Plan Principal Problem:   Cellulitis of both lower extremities Active Problems:   Chronic respiratory failure with hypoxia ([facility])   HTN (hypertension)   GAD (generalized anxiety disorder)  Janice Ryan  is a 63 y.o. female with medical history significant for asthma with chronic respiratory failure on 2 L supplemental O2 at home, hypertension, generalized anxiety disorder, and morbid obesity who is admitted for management of cellulitis of bilateral lower extremities.  Cellulitis of bilateral lower extremities: She has erythema of both lower extremities from the feet about halfway up the shin.  Probably some component of venous stasis as well. -Continue IV ceftriaxone  Excessive somnolence: Present at time of admission.  Suspect she has undiagnosed/untreated OSA/OHS resulting in increased O2 requirement while she is asleep.  This was probably exacerbated by IV Ativan given in the ED. -Hold further sedating medications -Continue supplemental oxygen, likely requires increased O2 at night -She should have outpatient evaluation for OSA/OHS  Asthma with chronic respiratory failure with hypoxia: On 2 L supplemental O2 at home.  She has an increased O2 requirement at sleep likely due to OSA/OHS as above.  Per records, she has not been taking Dulera due to inability to obtain it. -Continue Dulera, as needed albuterol, Singulair -Continue supplemental O2 -Consult TOC for medication assistance  Hypercalcemia: Mild hypercalcemia of 11.3 noted at time of admission.  Continue to monitor.  Of note prior CTA chest 07/28/2018 did show a 2.8 x 2.2 cm nodule in the anterior mediastinum.  It was recommended to have follow-up chest CT in 3-6 months or further characterization with PET CT.  Unclear follow-up and imaging was pursued.  Hypertension: Continue lisinopril-HCTZ.  Generalized anxiety disorder: Continue BuSpar and Cymbalta.  DVT prophylaxis: Lovenox Code Status: Full code per prior Family Communication: None available at time of admission Disposition Plan: Likely discharge to home in 1-2 days Consults called: None Admission status: Observation   Darreld McleanVishal Eh Sauseda MD [facility]  If  7PM-7AM, please contact night-coverage [email or web address]  03/20/2019, 8:10 PM

## 2019-03-21 ENCOUNTER — Observation Stay (HOSPITAL_COMMUNITY): Payer: Self-pay

## 2019-03-21 ENCOUNTER — Inpatient Hospital Stay (HOSPITAL_COMMUNITY): Payer: Self-pay

## 2019-03-21 DIAGNOSIS — F411 Generalized anxiety disorder: Secondary | ICD-10-CM

## 2019-03-21 DIAGNOSIS — L03119 Cellulitis of unspecified part of limb: Secondary | ICD-10-CM

## 2019-03-21 DIAGNOSIS — L0390 Cellulitis, unspecified: Secondary | ICD-10-CM | POA: Diagnosis present

## 2019-03-21 DIAGNOSIS — R52 Pain, unspecified: Secondary | ICD-10-CM

## 2019-03-21 DIAGNOSIS — M7989 Other specified soft tissue disorders: Secondary | ICD-10-CM

## 2019-03-21 DIAGNOSIS — I10 Essential (primary) hypertension: Secondary | ICD-10-CM

## 2019-03-21 LAB — BASIC METABOLIC PANEL
Anion gap: 6 (ref 5–15)
BUN: 16 mg/dL (ref 8–23)
CO2: 39 mmol/L — ABNORMAL HIGH (ref 22–32)
Calcium: 10.7 mg/dL — ABNORMAL HIGH (ref 8.9–10.3)
Chloride: 95 mmol/L — ABNORMAL LOW (ref 98–111)
Creatinine, Ser: 1.03 mg/dL — ABNORMAL HIGH (ref 0.44–1.00)
GFR calc Af Amer: >60 mL/min (ref >60)
GFR calc non Af Amer: 59 mL/min — ABNORMAL LOW (ref >60)
Glucose, Bld: 140 mg/dL — ABNORMAL HIGH (ref 70–99)
Potassium: 4.2 mmol/L (ref 3.5–5.1)
Sodium: 140 mmol/L (ref 135–145)

## 2019-03-21 LAB — CBC
HCT: 38.7 % (ref 36.0–46.0)
Hemoglobin: 11.1 g/dL — ABNORMAL LOW (ref 12.0–15.0)
MCH: 28.9 pg (ref 26.0–34.0)
MCHC: 28.7 g/dL — ABNORMAL LOW (ref 30.0–36.0)
MCV: 100.8 fL — ABNORMAL HIGH (ref 80.0–100.0)
Platelets: 247 10*3/uL (ref 150–400)
RBC: 3.84 MIL/uL — ABNORMAL LOW (ref 3.87–5.11)
RDW: 13.1 % (ref 11.5–15.5)
WBC: 11.1 10*3/uL — ABNORMAL HIGH (ref 4.0–10.5)
nRBC: 0 % (ref 0.0–0.2)

## 2019-03-21 MED ORDER — ALBUTEROL SULFATE (2.5 MG/3ML) 0.083% IN NEBU
2.5000 mg | INHALATION_SOLUTION | Freq: Two times a day (BID) | RESPIRATORY_TRACT | Status: DC
  Administered 2019-03-21 – 2019-03-29 (×18): 2.5 mg via RESPIRATORY_TRACT
  Filled 2019-03-21 (×19): qty 3

## 2019-03-21 MED ORDER — FUROSEMIDE 40 MG PO TABS
40.0000 mg | ORAL_TABLET | Freq: Every day | ORAL | Status: DC
  Administered 2019-03-21: 40 mg via ORAL
  Filled 2019-03-21 (×2): qty 1

## 2019-03-21 MED ORDER — LORATADINE 10 MG PO TABS
10.0000 mg | ORAL_TABLET | Freq: Every day | ORAL | Status: DC
  Administered 2019-03-21 – 2019-03-25 (×5): 10 mg via ORAL
  Filled 2019-03-21 (×6): qty 1

## 2019-03-21 MED ORDER — FLUTICASONE PROPIONATE 50 MCG/ACT NA SUSP
1.0000 | Freq: Every day | NASAL | Status: DC
  Administered 2019-03-21 – 2019-04-02 (×12): 1 via NASAL
  Filled 2019-03-21 (×4): qty 16

## 2019-03-21 NOTE — Progress Notes (Signed)
Spoke to patient regarding CPAP order and usage and patient states that she does not use CPAP at home and does not want it here.  No machine left in room.  Will be available if she changes her mind.

## 2019-03-21 NOTE — Progress Notes (Signed)
[facility]                                                                              Patient Demographics  Janice Ryan, is a 63 y.o. female, DOB - 05/21/1955, [id number]  Admit date - 03/20/2019   Admitting Physician Merrily PewVishal D Patel, MD  Outpatient Primary MD for the patient is Cain SaupeFulp, Cammie, MD  Outpatient specialists:   LOS - 0  days   Medical records reviewed and are as summarized below:    Chief Complaint  Patient presents with  . Feet Burning Sensation       Brief summary   Patient is a 63 year old female with history of asthma, chronic respiratory failure on 2.5 L, occasionally 3 L at home, hypertension, generalized anxiety, morbid obesity presented with erythema and pain in both of her lower extremities.  Patient had a telemetry visit with her PCP on 12/15 for symptoms of itching and burning on both of her feet, ongoing for 1.5 weeks.  Patient was seen in ED and was given amoxicillin to 50 mg daily.  Patient felt that her reported cellulitis was not improving and antibiotics was switched to Keflex but she had not picked up yet. COVID-19 test negative.  Patient was admitted for lower extremity cellulitis  Assessment & Plan    Principal Problem:   Cellulitis of both lower extremities -Patient has erythema of both lower extremities, up to knees, also venous stasis -Doppler ultrasound of the lower extremities negative for DVT -Continue IV Rocephin, elevation   Active Problems: Acute on chronic respiratory failure with hypoxia -At the time of my examination, patient noticed to be on O2 5 L high flow [place], O2 sats 92% -COVID-19 test negative -States she is on 2.5 to 3 L chronically at home -2D echo 07/2018 had shown EF > 65% with impaired relaxation, grade 1 diastolic CHF -Patient received Lasix 40 mg IV x1 last night, will place on oral Lasix 40 mg daily -Strict I's and O's and daily weights  Somnolence -Present at the time of  admission however currently alert and oriented.  Likely has underlying OSA with OHS. - will try CPAP at bedtime.,  Will need outpatient evaluation.   -Placed on Flonase and Claritin due to complaints of congestion  Chronic asthma -No acute wheezing.  Continue Dulera, as needed albuterol, Singulair  Mild hypercalcemia - Calcium 11.3 noted at the time of admission, improving to 10.7 -Prior CTA chest 07/28/2018 showed a 2.8 x 2.2 cm nodule in the anterior mediastinum.  It was recommended to have follow-up chest CT in 3-6 months or further characterization with PET CT.  Unclear follow-up and imaging was pursued.  Hypertension Currently stable, continue lisinopril, added Lasix, hold HCTZ  Generalized anxiety disorder: Continue BuSpar and Cymbalta.   Morbid obesity -BMI 56.2, counseled on diet when weight control  Right knee pain States she fell at home and had difficulty bearing weight on the right knee.  Able to bend the knee, likely not fracture. Follow x-ray of the right knee  code Status: Full code DVT Prophylaxis:  Lovenox  Family Communication: Discussed all imaging results, lab results, explained  to the patient    Disposition Plan: Home when medically stable, still has cellulitis  Time Spent in minutes   35 minutes  Procedures:  None  Consultants:   None  Antimicrobials:   Anti-infectives (From admission, onward)   Start     Dose/Rate Route Frequency Ordered Stop   03/20/19 2115  cefTRIAXone (ROCEPHIN) 2 g in sodium chloride 0.9 % 100 mL IVPB     2 g 200 mL/hr over 30 Minutes Intravenous Daily at bedtime 03/20/19 2009     03/20/19 1715  vancomycin (VANCOREADY) IVPB 2000 mg/400 mL     2,000 mg 200 mL/hr over 120 Minutes Intravenous  Once 03/20/19 1639 03/20/19 2125         Medications  Scheduled Meds: . albuterol  2.5 mg Nebulization BID  . busPIRone  5 mg Oral TID  . DULoxetine  60 mg Oral Daily  . enoxaparin (LOVENOX) injection  80 mg Subcutaneous QHS   . fluticasone  1 spray Each Nare Daily  . furosemide  40 mg Oral Daily  . lisinopril  10 mg Oral Daily  . loratadine  10 mg Oral Daily  . mometasone-formoterol  2 puff Inhalation BID  . montelukast  10 mg Oral QHS   Continuous Infusions: . cefTRIAXone (ROCEPHIN)  IV Stopped (03/20/19 2245)   PRN Meds:.acetaminophen **OR** acetaminophen, albuterol      Subjective:   Janice Ryan was seen and examined today.  States pain in the lower legs improving, still has redness on both legs below knees. Patient denies dizziness, abdominal pain, N/V/D/C, new weakness, numbess, tingling. No acute events overnight.    Objective:   Vitals:   03/21/19 0122 03/21/19 0135 03/21/19 0653 03/21/19 0805  BP:   (!) 151/78   Pulse:   81   Resp:   20   Temp:      TempSrc:      SpO2: 92% 94% 96% 96%  Weight:      Height:        Intake/Output Summary (Last 24 hours) at 03/21/2019 1147 Last data filed at 03/21/2019 1610 Gross per 24 hour  Intake 1070.32 ml  Output 1600 ml  Net -529.68 ml     Wt Readings from Last 3 Encounters:  03/20/19 (!) 167.8 kg  12/17/18 (!) 154.2 kg  07/31/18 (!) 224.1 kg     Exam  General: Alert and oriented x 3, NAD  Eyes:   HEENT:  Atraumatic, normocephalic, normal oropharynx  Cardiovascular: S1 S2 auscultated, no murmurs, RRR  Respiratory: Clear to auscultation bilaterally, no wheezing, rales or rhonchi  Gastrointestinal: Obese, soft, nontender, nondistended, + bowel sounds  Ext: 1+ pedal edema bilaterally with erythema and cellulitis  Neuro: No new deficit  Musculoskeletal: No digital cyanosis, clubbing  Skin: Redness, cellulitis of the lower extremities  Psych: Normal affect and demeanor, alert and oriented x3    Data Reviewed:  I have personally reviewed following labs and imaging studies  Micro Results Recent Results (from the past 240 hour(s))  SARS CORONAVIRUS 2 (TAT 6-24 HRS) Nasopharyngeal Nasopharyngeal Swab     Status:  None   Collection Time: 03/20/19  5:24 PM   Specimen: Nasopharyngeal Swab  Result Value Ref Range Status   SARS Coronavirus 2 NEGATIVE NEGATIVE Final    Comment: (NOTE) SARS-CoV-2 target nucleic acids are NOT DETECTED. The SARS-CoV-2 RNA is generally detectable in upper and lower respiratory specimens during the acute phase of infection. Negative results do not preclude SARS-CoV-2 infection, do not rule  out co-infections with other pathogens, and should not be used as the sole basis for treatment or other patient management decisions. Negative results must be combined with clinical observations, patient history, and epidemiological information. The expected result is Negative. Fact Sheet for Patients: SugarRoll.be Fact Sheet for Healthcare Providers: https://www.woods-mathews.com/ This test is not yet approved or cleared by the [place] FDA and  has been authorized for detection and/or diagnosis of SARS-CoV-2 by FDA under an Emergency Use Authorization (EUA). This EUA will remain  in effect (meaning this test can be used) for the duration of the COVID-19 declaration under Section 56 4(b)(1) of the Act, 21 U.S.C. section 360bbb-3(b)(1), unless the authorization is terminated or revoked sooner. Performed at [facility], [place] [address], [place], [place] [address]     Radiology Reports VAS [place] LOWER EXTREMITY VENOUS (DVT) (ONLY [facility] & [facility])  Result Date: 03/21/2019  Lower Venous Study Indications: Swelling.  Risk Factors: None identified. Limitations: Body habitus, poor ultrasound/tissue interface and patient positioning, patient immobility, patient somnolence. Comparison Study: No prior studies. Performing Technologist: Oliver Hum RVT  Examination Guidelines: A complete evaluation includes B-mode imaging, spectral Doppler, color Doppler, and power Doppler as needed of all accessible portions of each vessel. Bilateral testing is  considered an integral part of a complete examination. Limited examinations for reoccurring indications may be performed as noted.  +---------+---------------+---------+-----------+----------+--------------+ RIGHT    CompressibilityPhasicitySpontaneityPropertiesThrombus Aging +---------+---------------+---------+-----------+----------+--------------+ CFV      Full           Yes      Yes                                 +---------+---------------+---------+-----------+----------+--------------+ SFJ      Full                                                        +---------+---------------+---------+-----------+----------+--------------+ FV Prox                                               Not visualized +---------+---------------+---------+-----------+----------+--------------+ FV Mid                                                Not visualized +---------+---------------+---------+-----------+----------+--------------+ FV Distal                                             Not visualized +---------+---------------+---------+-----------+----------+--------------+ PFV                                                   Not visualized +---------+---------------+---------+-----------+----------+--------------+ POP      Full           Yes      Yes                                 +---------+---------------+---------+-----------+----------+--------------+  PTV      Full                                                        +---------+---------------+---------+-----------+----------+--------------+ PERO                                                  Not visualized +---------+---------------+---------+-----------+----------+--------------+   +---------+---------------+---------+-----------+----------+--------------+ LEFT     CompressibilityPhasicitySpontaneityPropertiesThrombus Aging  +---------+---------------+---------+-----------+----------+--------------+ CFV      Full           Yes      Yes                                 +---------+---------------+---------+-----------+----------+--------------+ SFJ      Full                                                        +---------+---------------+---------+-----------+----------+--------------+ FV Prox  Full                                                        +---------+---------------+---------+-----------+----------+--------------+ FV Mid                                                Not visualized +---------+---------------+---------+-----------+----------+--------------+ FV Distal                                             Not visualized +---------+---------------+---------+-----------+----------+--------------+ PFV                                                   Not visualized +---------+---------------+---------+-----------+----------+--------------+ POP      Full           Yes      Yes                                 +---------+---------------+---------+-----------+----------+--------------+ PTV                                                   Not visualized +---------+---------------+---------+-----------+----------+--------------+ PERO  Not visualized +---------+---------------+---------+-----------+----------+--------------+     Summary: Right: There is no evidence of deep vein thrombosis in the lower extremity. However, portions of this examination were limited- see technologist comments above. No cystic structure found in the popliteal fossa. Left: There is no evidence of deep vein thrombosis in the lower extremity. However, portions of this examination were limited- see technologist comments above. No cystic structure found in the popliteal fossa.  *See table(s) above for measurements and observations.    Preliminary      Lab Data:  CBC: Recent Labs  Lab 03/20/19 1724 03/21/19 0424  WBC 10.9* 11.1*  NEUTROABS 7.9*  --   HGB 10.7* 11.1*  HCT 35.9* 38.7  MCV 98.6 100.8*  PLT 259 247   Basic Metabolic Panel: Recent Labs  Lab 03/20/19 1724 03/21/19 0424  NA 139 140  K 4.6 4.2  CL 94* 95*  CO2 37* 39*  GLUCOSE 107* 140*  BUN 18 16  CREATININE 0.93 1.03*  CALCIUM 11.3* 10.7*   GFR: Estimated Creatinine Clearance: 95.5 mL/min (A) (by C-G formula based on SCr of 1.03 mg/dL (H)). Liver Function Tests: No results for input(s): AST, ALT, ALKPHOS, BILITOT, PROT, ALBUMIN in the last 168 hours. No results for input(s): LIPASE, AMYLASE in the last 168 hours. No results for input(s): AMMONIA in the last 168 hours. Coagulation Profile: No results for input(s): INR, PROTIME in the last 168 hours. Cardiac Enzymes: No results for input(s): CKTOTAL, CKMB, CKMBINDEX, TROPONINI in the last 168 hours. BNP (last 3 results) No results for input(s): PROBNP in the last 8760 hours. HbA1C: No results for input(s): HGBA1C in the last 72 hours. CBG: No results for input(s): GLUCAP in the last 168 hours. Lipid Profile: No results for input(s): CHOL, HDL, LDLCALC, TRIG, CHOLHDL, LDLDIRECT in the last 72 hours. Thyroid Function Tests: No results for input(s): TSH, T4TOTAL, FREET4, T3FREE, THYROIDAB in the last 72 hours. Anemia Panel: No results for input(s): VITAMINB12, FOLATE, FERRITIN, TIBC, IRON, RETICCTPCT in the last 72 hours. Urine analysis: No results found for: COLORURINE, APPEARANCEUR, LABSPEC, PHURINE, GLUCOSEU, HGBUR, BILIRUBINUR, KETONESUR, PROTEINUR, UROBILINOGEN, NITRITE, LEUKOCYTESUR   Chung Chagoya M.D. [facility] 03/21/2019, 11:47 AM   Call night coverage person covering after 7pm

## 2019-03-21 NOTE — Progress Notes (Signed)
Bilateral lower extremity venous duplex has been completed. Preliminary results can be found in CV Proc through chart review.   03/21/19 8:50 AM Janice Ryan RVT

## 2019-03-22 ENCOUNTER — Encounter (HOSPITAL_COMMUNITY): Payer: Self-pay

## 2019-03-22 ENCOUNTER — Inpatient Hospital Stay (HOSPITAL_COMMUNITY): Payer: Self-pay

## 2019-03-22 DIAGNOSIS — J9611 Chronic respiratory failure with hypoxia: Secondary | ICD-10-CM

## 2019-03-22 LAB — CBC
HCT: 38.2 % (ref 36.0–46.0)
Hemoglobin: 11.3 g/dL — ABNORMAL LOW (ref 12.0–15.0)
MCH: 29 pg (ref 26.0–34.0)
MCHC: 29.6 g/dL — ABNORMAL LOW (ref 30.0–36.0)
MCV: 97.9 fL (ref 80.0–100.0)
Platelets: 249 10*3/uL (ref 150–400)
RBC: 3.9 MIL/uL (ref 3.87–5.11)
RDW: 13.1 % (ref 11.5–15.5)
WBC: 16 10*3/uL — ABNORMAL HIGH (ref 4.0–10.5)
nRBC: 0 % (ref 0.0–0.2)

## 2019-03-22 LAB — D-DIMER, QUANTITATIVE: D-Dimer, Quant: 4.59 ug/mL-FEU — ABNORMAL HIGH (ref 0.00–0.50)

## 2019-03-22 LAB — BASIC METABOLIC PANEL
Anion gap: 9 (ref 5–15)
BUN: 13 mg/dL (ref 8–23)
CO2: 37 mmol/L — ABNORMAL HIGH (ref 22–32)
Calcium: 10.6 mg/dL — ABNORMAL HIGH (ref 8.9–10.3)
Chloride: 90 mmol/L — ABNORMAL LOW (ref 98–111)
Creatinine, Ser: 0.81 mg/dL (ref 0.44–1.00)
GFR calc Af Amer: >60 mL/min (ref >60)
GFR calc non Af Amer: >60 mL/min (ref >60)
Glucose, Bld: 123 mg/dL — ABNORMAL HIGH (ref 70–99)
Potassium: 3.5 mmol/L (ref 3.5–5.1)
Sodium: 136 mmol/L (ref 135–145)

## 2019-03-22 LAB — LIPASE, BLOOD: Lipase: 189 U/L — ABNORMAL HIGH (ref 11–51)

## 2019-03-22 LAB — BRAIN NATRIURETIC PEPTIDE: B Natriuretic Peptide: 57 pg/mL (ref 0.0–100.0)

## 2019-03-22 MED ORDER — ALPRAZOLAM 0.5 MG PO TABS
0.5000 mg | ORAL_TABLET | Freq: Every day | ORAL | Status: DC
  Administered 2019-03-22 – 2019-03-24 (×3): 0.5 mg via ORAL
  Filled 2019-03-22 (×3): qty 1

## 2019-03-22 MED ORDER — TRAMADOL HCL 50 MG PO TABS
50.0000 mg | ORAL_TABLET | Freq: Once | ORAL | Status: AC
  Administered 2019-03-22: 50 mg via ORAL
  Filled 2019-03-22: qty 1

## 2019-03-22 MED ORDER — SODIUM CHLORIDE (PF) 0.9 % IJ SOLN
INTRAMUSCULAR | Status: AC
  Filled 2019-03-22: qty 50

## 2019-03-22 MED ORDER — METRONIDAZOLE IN NACL 5-0.79 MG/ML-% IV SOLN
500.0000 mg | Freq: Three times a day (TID) | INTRAVENOUS | Status: DC
  Administered 2019-03-22 – 2019-03-25 (×9): 500 mg via INTRAVENOUS
  Filled 2019-03-22 (×9): qty 100

## 2019-03-22 MED ORDER — DULOXETINE HCL 60 MG PO CPEP
60.0000 mg | ORAL_CAPSULE | Freq: Every day | ORAL | Status: DC
  Administered 2019-03-22 – 2019-04-02 (×11): 60 mg via ORAL
  Filled 2019-03-22 (×6): qty 1
  Filled 2019-03-22: qty 2
  Filled 2019-03-22 (×3): qty 1
  Filled 2019-03-22: qty 2
  Filled 2019-03-22: qty 1

## 2019-03-22 MED ORDER — IOHEXOL 350 MG/ML SOLN
100.0000 mL | Freq: Once | INTRAVENOUS | Status: AC | PRN
  Administered 2019-03-22: 100 mL via INTRAVENOUS

## 2019-03-22 MED ORDER — TRAMADOL HCL 50 MG PO TABS
50.0000 mg | ORAL_TABLET | Freq: Four times a day (QID) | ORAL | Status: DC | PRN
  Administered 2019-03-22 – 2019-03-23 (×2): 50 mg via ORAL
  Filled 2019-03-22 (×2): qty 1

## 2019-03-22 NOTE — Plan of Care (Signed)
Patient lying in bed this morning; complains of chronic generalized/back pain. No needs expressed. Will continue to monitor.

## 2019-03-22 NOTE — Progress Notes (Signed)
Pt. Continues to refuse CPAP at this time.  Will be available if she changes her mind.

## 2019-03-22 NOTE — Plan of Care (Signed)
  Problem: Clinical Measurements: Goal: Ability to avoid or minimize complications of infection will improve Outcome: Progressing   Problem: Skin Integrity: Goal: Skin integrity will improve Outcome: Progressing   Problem: Education: Goal: Knowledge of General Education information will improve Description: Including pain rating scale, medication(s)/side effects and non-pharmacologic comfort measures Outcome: Progressing   Problem: Health Behavior/Discharge Planning: Goal: Ability to manage health-related needs will improve Outcome: Progressing   Problem: Activity: Goal: Risk for activity intolerance will decrease Outcome: Progressing   Problem: Nutrition: Goal: Adequate nutrition will be maintained Outcome: Progressing   Problem: Coping: Goal: Level of anxiety will decrease Outcome: Progressing   Problem: Elimination: Goal: Will not experience complications related to bowel motility Outcome: Progressing Goal: Will not experience complications related to urinary retention Outcome: Progressing   Problem: Pain Managment: Goal: General experience of comfort will improve Outcome: Progressing   Problem: Safety: Goal: Ability to remain free from injury will improve Outcome: Progressing   

## 2019-03-22 NOTE — Progress Notes (Signed)
[facility]                                                                              Patient Demographics  Janice Ryan, is a 63 y.o. female, DOB - [birth year], VOJ:[id number]  Admit date - 03/20/2019   Admitting Physician Craig Guess, MD  Outpatient Primary MD for the patient is Antony Blackbird, MD  Outpatient specialists:   LOS - 1  days   Medical records reviewed and are as summarized below:    Chief Complaint  Patient presents with  . Feet Burning Sensation       Brief summary   Patient is a 63 year old female with history of asthma, chronic respiratory failure on 2.5 L, occasionally 3 L at home, hypertension, generalized anxiety, morbid obesity presented with erythema and pain in both of her lower extremities.  Patient had a telemetry visit with her PCP on 12/15 for symptoms of itching and burning on both of her feet, ongoing for 1.5 weeks.  Patient was seen in ED and was given amoxicillin to 50 mg daily.  Patient felt that her reported cellulitis was not improving and antibiotics was switched to Keflex but she had not picked up yet. COVID-19 test negative.  Patient was admitted for lower extremity cellulitis  Assessment & Plan    Principal Problem:   Cellulitis of both lower extremities -Patient had erythema of both lower extremities, up to knees, also venous stasis -Doppler ultrasound of the lower extremities negative for DVT -Placed on IV Rocephin, received Lasix, cellulitis significantly improving   Active Problems: Acute on chronic respiratory failure with hypoxia -At the time of my exam, patient on 5 L O2 via [place].  Will wean O2 as tolerated -COVID-19 test negative -States she is on 2.5 to 3 L chronically at home -2D echo 07/2018 had shown EF > 65% with impaired relaxation, grade 1 diastolic CHF -Continue Lasix 40 mg daily, D-dimer 4.59. -CT angiogram of the chest showed no PE, stable well-circumscribed solid nodule versus complex  cyst in the anterior mediastinum, stable for last 8 months.  Follow-up in 12 months.  Suggestion of new inflammatory stranding adjacent to tail of pancreas and splenic flexure, possible colitis diverticulitis or pancreatitis  Acute pancreatitis versus colitis -Incidental finding on CT abdomen versus colitis -Lipase elevated 189, placed on full liquids, hold Lasix.  No acute complaints of abdominal pain. -added IV Flagyl, continue IV Rocephin  Somnolence -Present at the time of admission however currently alert and oriented.  Likely has underlying OSA with OHS. -Currently alert and oriented, no acute complaints  Chronic asthma -No acute wheezing.  Continue Dulera, as needed albuterol, Singulair  Mild hypercalcemia - Calcium 11.3 noted at the time of admission, improving to 10.7 -Prior CTA chest 07/28/2018 showed a 2.8 x 2.2 cm nodule in the anterior mediastinum.  It was recommended to have follow-up chest CT in 3-6 months or further characterization with PET CT.  Unclear follow-up and imaging was pursued.  Hypertension Currently stable, continue lisinopril, hold Lasix  Generalized anxiety disorder: Continue BuSpar and Cymbalta.  Morbid obesity -BMI 56.2, counseled on diet and weight control  Right  knee pain States she fell at home and had difficulty bearing weight on the right knee.  Able to bend the knee, likely not fracture. X-ray of the right knee showed severe osteoarthritis  code Status: Full code DVT Prophylaxis:  Lovenox  Family Communication: Discussed all imaging results, lab results, explained to the patient    Disposition Plan: Hopefully DC home in next 24 to 48 hours if clinically improving  Time Spent in minutes 25 minutes  Procedures:  None  Consultants:   None  Antimicrobials:   Anti-infectives (From admission, onward)   Start     Dose/Rate Route Frequency Ordered Stop   03/22/19 1200  metroNIDAZOLE (FLAGYL) IVPB 500 mg     500 mg 100 mL/hr over 60  Minutes Intravenous Every 8 hours 03/22/19 1109     03/20/19 2115  cefTRIAXone (ROCEPHIN) 2 g in sodium chloride 0.9 % 100 mL IVPB     2 g 200 mL/hr over 30 Minutes Intravenous Daily at bedtime 03/20/19 2009     03/20/19 1715  vancomycin (VANCOREADY) IVPB 2000 mg/400 mL     2,000 mg 200 mL/hr over 120 Minutes Intravenous  Once 03/20/19 1639 03/20/19 2125         Medications  Scheduled Meds: . albuterol  2.5 mg Nebulization BID  . ALPRAZolam  0.5 mg Oral Daily  . busPIRone  5 mg Oral TID  . DULoxetine  60 mg Oral Daily  . enoxaparin (LOVENOX) injection  80 mg Subcutaneous QHS  . fluticasone  1 spray Each Nare Daily  . furosemide  40 mg Oral Daily  . lisinopril  10 mg Oral Daily  . loratadine  10 mg Oral Daily  . mometasone-formoterol  2 puff Inhalation BID  . montelukast  10 mg Oral QHS  . sodium chloride (PF)       Continuous Infusions: . cefTRIAXone (ROCEPHIN)  IV 2 g (03/22/19 0022)  . metronidazole 500 mg (03/22/19 1239)   PRN Meds:.acetaminophen **OR** acetaminophen, albuterol, traMADol      Subjective:   Latasha Buczkowski was seen and examined today.  No acute complaints.  Redness and swelling on both lower extremities improving. Patient denies dizziness, abdominal pain,  new weakness, numbess, tingling. No acute events overnight.  No nausea or vomiting or diarrhea.  Objective:   Vitals:   03/21/19 2025 03/22/19 0616 03/22/19 0817 03/22/19 0829  BP: (!) 159/75 123/69    Pulse: 89 86 81 85  Resp: Temp: 98.8 F (37.1 C) 97.9 F (36.6 C)    TempSrc: Oral Oral    SpO2: 96% 97% 94% 91%  Weight:      Height:        Intake/Output Summary (Last 24 hours) at 03/22/2019 1312 Last data filed at 03/22/2019 1023 Gross per 24 hour  Intake 2020 ml  Output 2151 ml  Net -131 ml     Wt Readings from Last 3 Encounters:  03/20/19 (!) 167.8 kg  12/17/18 (!) 154.2 kg  07/31/18 (!) 224.1 kg   Physical Exam  General: Alert and oriented x 3,  NAD  Eyes:   HEENT:  Atraumatic, normocephalic  Cardiovascular: S1 S2 clear, RRR. trace pedal edema b/l  Respiratory: CTAB, no wheezing, rales or rhonchi  Gastrointestinal: Soft, nontender, nondistended, NBS  Ext: trace pedal edema bilaterally  Neuro: no new deficits  Musculoskeletal: No cyanosis, clubbing  Skin: Mild cellulitis both lower extremities improving  Psych: Normal affect and demeanor, alert and oriented x3  Data Reviewed:  I have personally reviewed following labs and imaging studies  Micro Results Recent Results (from the past 240 hour(s))  SARS CORONAVIRUS 2 (TAT 6-24 HRS) Nasopharyngeal Nasopharyngeal Swab     Status: None   Collection Time: 03/20/19  5:24 PM   Specimen: Nasopharyngeal Swab  Result Value Ref Range Status   SARS Coronavirus 2 NEGATIVE NEGATIVE Final    Comment: (NOTE) SARS-CoV-2 target nucleic acids are NOT DETECTED. The SARS-CoV-2 RNA is generally detectable in upper and lower respiratory specimens during the acute phase of infection. Negative results do not preclude SARS-CoV-2 infection, do not rule out co-infections with other pathogens, and should not be used as the sole basis for treatment or other patient management decisions. Negative results must be combined with clinical observations, patient history, and epidemiological information. The expected result is Negative. Fact Sheet for Patients: HairSlick.no Fact Sheet for Healthcare Providers: quierodirigir.com This test is not yet approved or cleared by the [place] FDA and  has been authorized for detection and/or diagnosis of SARS-CoV-2 by FDA under an Emergency Use Authorization (EUA). This EUA will remain  in effect (meaning this test can be used) for the duration of the COVID-19 declaration under Section 56 4(b)(1) of the Act, 21 U.S.C. section 360bbb-3(b)(1), unless the authorization is terminated or revoked  sooner. Performed at [facility], [address], [place], [place] [address]     Radiology Reports CT ANGIO CHEST PE W OR WO CONTRAST  Result Date: 03/22/2019 CLINICAL DATA:  Shortness of breath, asthma, chronic respiratory failure and lower extremity cellulitis. EXAM: CT ANGIOGRAPHY CHEST WITH CONTRAST TECHNIQUE: Multidetector CT imaging of the chest was performed using the standard protocol during bolus administration of intravenous contrast. Multiplanar CT image reconstructions and MIPs were obtained to evaluate the vascular anatomy. CONTRAST:  OMNIPAQUE IOHEXOL 350 MG/ML SOLN COMPARISON:  Prior CTA of the chest on 07/28/2018 FINDINGS: Cardiovascular: Body habitus and some respiratory motion again limits pulmonary arterial evaluation beyond the segmental level. No significant pulmonary embolism identified. Pulmonary arteries are normal in caliber. The heart size is normal. No pericardial fluid identified. The thoracic aorta is normal in caliber. No significant calcified plaque identified in the distribution of the coronary arteries. Mediastinum/Nodes: Stable well-circumscribed solid nodule versus complex cyst in the anterior mediastinum immediately anterior to the main pulmonary artery and measuring approximately 2.4 x 2.7 cm. No additional mediastinal abnormalities. No hilar or axillary lymphadenopathy. Lungs/Pleura: Bibasilar atelectasis. There is no evidence of pulmonary edema, consolidation, pneumothorax, nodule or pleural fluid. Upper Abdomen: In the visualized left upper abdomen, there is suggestion of new inflammatory stranding in the posterior left upper quadrant fat adjacent to the tail of the pancreas and splenic flexure of the colon. Inflammatory process such as colitis, diverticulitis or pancreatitis cannot be excluded and correlation is suggested with laboratory tests. CT of the abdomen and pelvis with contrast may be helpful if there is any suspicion for an acute abdominal  process. 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 Review of the MIP images confirms the above findings. IMPRESSION: 1. No evidence of pulmonary embolism or other acute findings in the chest. 2. Stable well-circumscribed solid nodule versus complex cyst in the anterior mediastinum immediately anterior to the main pulmonary artery. This has been stable for 8 months. Additional follow-up in the 12 month range would be appropriate to ensure continued stability. 3. Suggestion of new inflammatory stranding in the posterior left upper quadrant fat adjacent to the tail of the  pancreas and splenic flexure of the colon. Inflammatory process such as colitis, diverticulitis or pancreatitis cannot be excluded and correlation suggested with laboratory tests. CT of the abdomen and pelvis with contrast may be helpful if there is any suspicion for an acute abdominal process. Electronically Signed   By: Irish Lack M.D.   On: 03/22/2019 10:47   DG Knee Right Port  Result Date: 03/21/2019 CLINICAL DATA:  Acute on chronic right knee pain.  No known injury. EXAM: PORTABLE RIGHT KNEE - 1-2 VIEW COMPARISON:  Plain films right knee 12/17/2018. FINDINGS: There is no acute bony or joint abnormality. Advanced osteoarthritis about the knee is again seen and worst in the medial compartment where there is bone-on-bone joint space narrowing. Soft tissues are unremarkable. IMPRESSION: No acute abnormality. Advanced osteoarthritis is most severe in the medial compartment. Electronically Signed   By: Drusilla Kanner M.D.   On: 03/21/2019 15:35   VAS [place] LOWER EXTREMITY VENOUS (DVT) (ONLY [facility] & [facility])  Result Date: 03/21/2019  Lower Venous Study Indications: Swelling.  Risk Factors: None identified. Limitations: Body habitus, poor ultrasound/tissue interface and patient positioning, patient immobility, patient somnolence. Comparison Study: No prior studies. Performing Technologist: Chanda Busing RVT   Examination Guidelines: A complete evaluation includes B-mode imaging, spectral Doppler, color Doppler, and power Doppler as needed of all accessible portions of each vessel. Bilateral testing is considered an integral part of a complete examination. Limited examinations for reoccurring indications may be performed as noted.  +---------+---------------+---------+-----------+----------+--------------+ RIGHT    CompressibilityPhasicitySpontaneityPropertiesThrombus Aging +---------+---------------+---------+-----------+----------+--------------+ CFV      Full           Yes      Yes                                 +---------+---------------+---------+-----------+----------+--------------+ SFJ      Full                                                        +---------+---------------+---------+-----------+----------+--------------+ FV Prox                                               Not visualized +---------+---------------+---------+-----------+----------+--------------+ FV Mid                                                Not visualized +---------+---------------+---------+-----------+----------+--------------+ FV Distal                                             Not visualized +---------+---------------+---------+-----------+----------+--------------+ PFV                                                   Not visualized +---------+---------------+---------+-----------+----------+--------------+ POP      Full  Yes      Yes                                 +---------+---------------+---------+-----------+----------+--------------+ PTV      Full                                                        +---------+---------------+---------+-----------+----------+--------------+ PERO                                                  Not visualized +---------+---------------+---------+-----------+----------+--------------+    +---------+---------------+---------+-----------+----------+--------------+ LEFT     CompressibilityPhasicitySpontaneityPropertiesThrombus Aging +---------+---------------+---------+-----------+----------+--------------+ CFV      Full           Yes      Yes                                 +---------+---------------+---------+-----------+----------+--------------+ SFJ      Full                                                        +---------+---------------+---------+-----------+----------+--------------+ FV Prox  Full                                                        +---------+---------------+---------+-----------+----------+--------------+ FV Mid                                                Not visualized +---------+---------------+---------+-----------+----------+--------------+ FV Distal                                             Not visualized +---------+---------------+---------+-----------+----------+--------------+ PFV                                                   Not visualized +---------+---------------+---------+-----------+----------+--------------+ POP      Full           Yes      Yes                                 +---------+---------------+---------+-----------+----------+--------------+ PTV  Not visualized +---------+---------------+---------+-----------+----------+--------------+ PERO                                                  Not visualized +---------+---------------+---------+-----------+----------+--------------+     Summary: Right: There is no evidence of deep vein thrombosis in the lower extremity. However, portions of this examination were limited- see technologist comments above. No cystic structure found in the popliteal fossa. Left: There is no evidence of deep vein thrombosis in the lower extremity. However, portions of this examination were limited-  see technologist comments above. No cystic structure found in the popliteal fossa.  *See table(s) above for measurements and observations.    Preliminary     Lab Data:  CBC: Recent Labs  Lab 03/20/19 1724 03/21/19 0424 03/22/19 0433  WBC 10.9* 11.1* 16.0*  NEUTROABS 7.9*  --   --   HGB 10.7* 11.1* 11.3*  HCT 35.9* 38.7 38.2  MCV 98.6 100.8* 97.9  PLT 259 247 249   Basic Metabolic Panel: Recent Labs  Lab 03/20/19 1724 03/21/19 0424 03/22/19 0433  NA 139 140 136  K 4.6 4.2 3.5  CL 94* 95* 90*  CO2 37* 39* 37*  GLUCOSE 107* 140* 123*  BUN 18 16 13   CREATININE 0.93 1.03* 0.81  CALCIUM 11.3* 10.7* 10.6*   GFR: Estimated Creatinine Clearance: 121.5 mL/min (by C-G formula based on SCr of 0.81 mg/dL). Liver Function Tests: No results for input(s): AST, ALT, ALKPHOS, BILITOT, PROT, ALBUMIN in the last 168 hours. Recent Labs  Lab 03/22/19 1109  LIPASE 189*   No results for input(s): AMMONIA in the last 168 hours. Coagulation Profile: No results for input(s): INR, PROTIME in the last 168 hours. Cardiac Enzymes: No results for input(s): CKTOTAL, CKMB, CKMBINDEX, TROPONINI in the last 168 hours. BNP (last 3 results) No results for input(s): PROBNP in the last 8760 hours. HbA1C: No results for input(s): HGBA1C in the last 72 hours. CBG: No results for input(s): GLUCAP in the last 168 hours. Lipid Profile: No results for input(s): CHOL, HDL, LDLCALC, TRIG, CHOLHDL, LDLDIRECT in the last 72 hours. Thyroid Function Tests: No results for input(s): TSH, T4TOTAL, FREET4, T3FREE, THYROIDAB in the last 72 hours. Anemia Panel: No results for input(s): VITAMINB12, FOLATE, FERRITIN, TIBC, IRON, RETICCTPCT in the last 72 hours. Urine analysis: No results found for: COLORURINE, APPEARANCEUR, LABSPEC, PHURINE, GLUCOSEU, HGBUR, BILIRUBINUR, KETONESUR, PROTEINUR, UROBILINOGEN, NITRITE, LEUKOCYTESUR   Danitra Payano M.D. [facility] 03/22/2019, 1:12 PM   Call night coverage  person covering after 7pm

## 2019-03-23 ENCOUNTER — Other Ambulatory Visit: Payer: Self-pay | Admitting: Family Medicine

## 2019-03-23 DIAGNOSIS — F411 Generalized anxiety disorder: Secondary | ICD-10-CM

## 2019-03-23 LAB — CBC
HCT: 39 % (ref 36.0–46.0)
Hemoglobin: 11.2 g/dL — ABNORMAL LOW (ref 12.0–15.0)
MCH: 28.3 pg (ref 26.0–34.0)
MCHC: 28.7 g/dL — ABNORMAL LOW (ref 30.0–36.0)
MCV: 98.5 fL (ref 80.0–100.0)
Platelets: 256 10*3/uL (ref 150–400)
RBC: 3.96 MIL/uL (ref 3.87–5.11)
RDW: 13 % (ref 11.5–15.5)
WBC: 16.7 10*3/uL — ABNORMAL HIGH (ref 4.0–10.5)
nRBC: 0 % (ref 0.0–0.2)

## 2019-03-23 LAB — LIPID PANEL
Cholesterol: 148 mg/dL (ref 0–200)
HDL: 59 mg/dL (ref >40)
LDL Cholesterol: 77 mg/dL (ref 0–99)
Total CHOL/HDL Ratio: 2.5 RATIO
Triglycerides: 59 mg/dL (ref <150)
VLDL: 12 mg/dL (ref 0–40)

## 2019-03-23 LAB — BASIC METABOLIC PANEL
Anion gap: 10 (ref 5–15)
BUN: 12 mg/dL (ref 8–23)
CO2: 37 mmol/L — ABNORMAL HIGH (ref 22–32)
Calcium: 10.6 mg/dL — ABNORMAL HIGH (ref 8.9–10.3)
Chloride: 90 mmol/L — ABNORMAL LOW (ref 98–111)
Creatinine, Ser: 0.81 mg/dL (ref 0.44–1.00)
GFR calc Af Amer: >60 mL/min (ref >60)
GFR calc non Af Amer: >60 mL/min (ref >60)
Glucose, Bld: 97 mg/dL (ref 70–99)
Potassium: 3.7 mmol/L (ref 3.5–5.1)
Sodium: 137 mmol/L (ref 135–145)

## 2019-03-23 LAB — LIPASE, BLOOD: Lipase: 108 U/L — ABNORMAL HIGH (ref 11–51)

## 2019-03-23 NOTE — Plan of Care (Signed)
  Problem: Clinical Measurements: Goal: Ability to avoid or minimize complications of infection will improve Outcome: Progressing   Problem: Skin Integrity: Goal: Skin integrity will improve Outcome: Progressing   Problem: Education: Goal: Knowledge of General Education information will improve Description: Including pain rating scale, medication(s)/side effects and non-pharmacologic comfort measures Outcome: Progressing   Problem: Health Behavior/Discharge Planning: Goal: Ability to manage health-related needs will improve Outcome: Progressing   Problem: Activity: Goal: Risk for activity intolerance will decrease Outcome: Progressing   Problem: Nutrition: Goal: Adequate nutrition will be maintained Outcome: Progressing   Problem: Coping: Goal: Level of anxiety will decrease Outcome: Progressing   Problem: Elimination: Goal: Will not experience complications related to bowel motility Outcome: Progressing Goal: Will not experience complications related to urinary retention Outcome: Progressing   Problem: Pain Managment: Goal: General experience of comfort will improve Outcome: Progressing   Problem: Safety: Goal: Ability to remain free from injury will improve Outcome: Progressing

## 2019-03-23 NOTE — Progress Notes (Signed)
[facility]                                                                              Patient Demographics  Janice JulianDonna Ryan, is a 63 y.o. female, DOB - 02/18/1956, WUJ:[id number]  Admit date - 03/20/2019   Admitting Physician Merrily PewVishal D Patel, MD  Outpatient Primary MD for the patient is Cain SaupeFulp, Cammie, MD  Outpatient specialists:   LOS - 2  days   Medical records reviewed and are as summarized below:    Chief Complaint  Patient presents with  . Feet Burning Sensation       Brief summary   Patient is a 63 year old female with history of asthma, chronic respiratory failure on 2.5 L, occasionally 3 L at home, hypertension, generalized anxiety, morbid obesity presented with erythema and pain in both of her lower extremities.  Patient had a telemetry visit with her PCP on 12/15 for symptoms of itching and burning on both of her feet, ongoing for 1.5 weeks.  Patient was seen in ED and was given amoxicillin to 50 mg daily.  Patient felt that her reported cellulitis was not improving and antibiotics was switched to Keflex but she had not picked up yet. COVID-19 test negative.  Patient was admitted for lower extremity cellulitis  Assessment & Plan    Principal Problem:   Cellulitis of both lower extremities -Patient had erythema of both lower extremities, up to knees, also venous stasis -Doppler ultrasound of the lower extremities negative for DVT -Much improved, on IV Rocephin, also received Lasix.   Active Problems: Acute on chronic respiratory failure with hypoxia -At the time of my exam, on 3 L O2 via nasal cannula, chronically on 2.5 to 3 L at home.  Patient has been on 5 to 6 L O2 during hospitalization.  COVID-19 test negative -Patient was placed on Lasix -2D echo 07/2018 had shown EF > 65% with impaired relaxation, grade 1 diastolic CHF - D-dimer 4.59. -CT angiogram of the chest showed no PE, stable well-circumscribed solid nodule versus complex  cyst in the anterior mediastinum, stable for last 8 months.  Follow-up in 12 months.  Suggestion of new inflammatory stranding adjacent to tail of pancreas and splenic flexure, possible colitis diverticulitis or pancreatitis  Acute pancreatitis versus colitis -Incidental finding on CT abdomen versus colitis -Lipase elevated 189, patient was placed on full liquids, tolerating.  Lipase 108 this morning.   -Lasix was held, Flagyl was added. -Currently no acute abdominal complaints, nausea vomiting or diarrhea.  Diet increased to heart healthy.  Somnolence at the time of admission -Present at the time of admission however currently alert and oriented.  Likely has underlying OSA with OHS. -Currently alert and oriented, no acute complaints  Chronic asthma -No acute wheezing.  Continue Dulera, as needed albuterol, Singulair  Mild hypercalcemia - Calcium 11.3 noted at the time of admission, currently improved to 10.6. -Prior CTA chest 07/28/2018 showed a 2.8 x 2.2 cm nodule in the anterior mediastinum.  It was recommended to have follow-up chest CT in 3-6 months or further characterization with PET CT.  Unclear follow-up and imaging was pursued.  Hypertension Currently  stable, continue lisinopril, hold Lasix  Generalized anxiety disorder: Continue BuSpar and Cymbalta.  Morbid obesity -BMI 56.2, counseled on diet and weight control  Right knee pain States she fell at home and had difficulty bearing weight on the right knee.  Able to bend the knee, likely not fracture. X-ray of the right knee showed severe osteoarthritis  code Status: Full code DVT Prophylaxis:  Lovenox  Family Communication: Discussed all imaging results, lab results, explained to the patient    Disposition Plan: Continues to complain of generalized weakness, states has not been walking much.  PT OT evaluation pending.  Time Spent in minutes 25 minutes  Procedures:  None  Consultants:   None  Antimicrobials:    Anti-infectives (From admission, onward)   Start     Dose/Rate Route Frequency Ordered Stop   03/22/19 1200  metroNIDAZOLE (FLAGYL) IVPB 500 mg     500 mg 100 mL/hr over 60 Minutes Intravenous Every 8 hours 03/22/19 1109     03/20/19 2115  cefTRIAXone (ROCEPHIN) 2 g in sodium chloride 0.9 % 100 mL IVPB     2 g 200 mL/hr over 30 Minutes Intravenous Daily at bedtime 03/20/19 2009     03/20/19 1715  vancomycin (VANCOREADY) IVPB 2000 mg/400 mL     2,000 mg 200 mL/hr over 120 Minutes Intravenous  Once 03/20/19 1639 03/20/19 2125         Medications  Scheduled Meds: . albuterol  2.5 mg Nebulization BID  . ALPRAZolam  0.5 mg Oral Daily  . busPIRone  5 mg Oral TID  . DULoxetine  60 mg Oral Daily  . enoxaparin (LOVENOX) injection  80 mg Subcutaneous QHS  . fluticasone  1 spray Each Nare Daily  . lisinopril  10 mg Oral Daily  . loratadine  10 mg Oral Daily  . mometasone-formoterol  2 puff Inhalation BID  . montelukast  10 mg Oral QHS   Continuous Infusions: . cefTRIAXone (ROCEPHIN)  IV 2 g (03/22/19 2109)  . metronidazole 500 mg (03/23/19 0554)   PRN Meds:.acetaminophen **OR** acetaminophen, albuterol, traMADol      Subjective:   Janice Ryan was seen and examined today.  Complains of feeling weak, generalized.  No fevers or chills.  Redness and swelling on the both lower extremities much improved from admission.  Patient denies dizziness, abdominal pain,  new weakness, numbess, tingling.  No worsening abdominal pain nausea vomiting or diarrhea.  Objective:   Vitals:   03/22/19 1342 03/22/19 2015 03/22/19 2100 03/23/19 0800  BP: (!) 151/69 (!) 147/73    Pulse: 98 92 92 94  Resp: 20 19 19 20   Temp: 99 F (37.2 C) 98.4 F (36.9 C) 98.4 F (36.9 C)   TempSrc: Oral Oral Oral   SpO2: 93% 92% 94% 92%  Weight:      Height:        Intake/Output Summary (Last 24 hours) at 03/23/2019 1121 Last data filed at 03/23/2019 0600 Gross per 24 hour  Intake 880 ml   Output 1400 ml  Net -520 ml     Wt Readings from Last 3 Encounters:  03/20/19 (!) 167.8 kg  12/17/18 (!) 154.2 kg  07/31/18 (!) 224.1 kg   Physical Exam  General: Alert and oriented x 3, NAD  Eyes:   HEENT:  Atraumatic, normocephalic  Cardiovascular: S1 S2 clear, RRR.  Trace pedal edema b/l  Respiratory: CTAB, no wheezing, rales or rhonchi  Gastrointestinal: Morbidly obese, soft, nontender, NBS  Ext: Trace pedal edema bilaterally  Neuro: no new deficits  Musculoskeletal: No cyanosis, clubbing  Skin: Mild cellulitis both lower extremities much improved  Psych: Normal affect and demeanor, alert and oriented x3     Data Reviewed:  I have personally reviewed following labs and imaging studies  Micro Results Recent Results (from the past 240 hour(s))  SARS CORONAVIRUS 2 (TAT 6-24 HRS) Nasopharyngeal Nasopharyngeal Swab     Status: None   Collection Time: 03/20/19  5:24 PM   Specimen: Nasopharyngeal Swab  Result Value Ref Range Status   SARS Coronavirus 2 NEGATIVE NEGATIVE Final    Comment: (NOTE) SARS-CoV-2 target nucleic acids are NOT DETECTED. The SARS-CoV-2 RNA is generally detectable in upper and lower respiratory specimens during the acute phase of infection. Negative results do not preclude SARS-CoV-2 infection, do not rule out co-infections with other pathogens, and should not be used as the sole basis for treatment or other patient management decisions. Negative results must be combined with clinical observations, patient history, and epidemiological information. The expected result is Negative. Fact Sheet for Patients: HairSlick.no Fact Sheet for Healthcare Providers: quierodirigir.com This test is not yet approved or cleared by the [place] FDA and  has been authorized for detection and/or diagnosis of SARS-CoV-2 by FDA under an Emergency Use Authorization (EUA). This EUA will remain  in  effect (meaning this test can be used) for the duration of the COVID-19 declaration under Section 56 4(b)(1) of the Act, 21 U.S.C. section 360bbb-3(b)(1), unless the authorization is terminated or revoked sooner. Performed at [facility], [address] [place], [place] [address]     Radiology Reports CT ANGIO CHEST PE W OR WO CONTRAST  Result Date: 03/22/2019 CLINICAL DATA:  Shortness of breath, asthma, chronic respiratory failure and lower extremity cellulitis. EXAM: CT ANGIOGRAPHY CHEST WITH CONTRAST TECHNIQUE: Multidetector CT imaging of the chest was performed using the standard protocol during bolus administration of intravenous contrast. Multiplanar CT image reconstructions and MIPs were obtained to evaluate the vascular anatomy. CONTRAST:  OMNIPAQUE IOHEXOL 350 MG/ML SOLN COMPARISON:  Prior CTA of the chest on 07/28/2018 FINDINGS: Cardiovascular: Body habitus and some respiratory motion again limits pulmonary arterial evaluation beyond the segmental level. No significant pulmonary embolism identified. Pulmonary arteries are normal in caliber. The heart size is normal. No pericardial fluid identified. The thoracic aorta is normal in caliber. No significant calcified plaque identified in the distribution of the coronary arteries. Mediastinum/Nodes: Stable well-circumscribed solid nodule versus complex cyst in the anterior mediastinum immediately anterior to the main pulmonary artery and measuring approximately 2.4 x 2.7 cm. No additional mediastinal abnormalities. No hilar or axillary lymphadenopathy. Lungs/Pleura: Bibasilar atelectasis. There is no evidence of pulmonary edema, consolidation, pneumothorax, nodule or pleural fluid. Upper Abdomen: In the visualized left upper abdomen, there is suggestion of new inflammatory stranding in the posterior left upper quadrant fat adjacent to the tail of the pancreas and splenic flexure of the colon. Inflammatory process such as colitis,  diverticulitis or pancreatitis cannot be excluded and correlation is suggested with laboratory tests. CT of the abdomen and pelvis with contrast may be helpful if there is any suspicion for an acute abdominal process. 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 Review of the MIP images confirms the above findings. IMPRESSION: 1. No evidence of pulmonary embolism or other acute findings in the chest. 2. Stable well-circumscribed solid nodule versus complex cyst in the anterior mediastinum immediately anterior to the main pulmonary artery. This has been stable for 8 months. Additional  follow-up in the 12 month range would be appropriate to ensure continued stability. 3. Suggestion of new inflammatory stranding in the posterior left upper quadrant fat adjacent to the tail of the pancreas and splenic flexure of the colon. Inflammatory process such as colitis, diverticulitis or pancreatitis cannot be excluded and correlation suggested with laboratory tests. CT of the abdomen and pelvis with contrast may be helpful if there is any suspicion for an acute abdominal process. Electronically Signed   By: Irish Lack M.D.   On: 03/22/2019 10:47   DG Knee Right Port  Result Date: 03/21/2019 CLINICAL DATA:  Acute on chronic right knee pain.  No known injury. EXAM: PORTABLE RIGHT KNEE - 1-2 VIEW COMPARISON:  Plain films right knee 12/17/2018. FINDINGS: There is no acute bony or joint abnormality. Advanced osteoarthritis about the knee is again seen and worst in the medial compartment where there is bone-on-bone joint space narrowing. Soft tissues are unremarkable. IMPRESSION: No acute abnormality. Advanced osteoarthritis is most severe in the medial compartment. Electronically Signed   By: Drusilla Kanner M.D.   On: 03/21/2019 15:35   VAS [place] LOWER EXTREMITY VENOUS (DVT) (ONLY [facility] & [facility])  Result Date: 03/22/2019  Lower Venous Study Indications: Swelling.  Risk Factors: None identified.  Limitations: Body habitus, poor ultrasound/tissue interface and patient positioning, patient immobility, patient somnolence. Comparison Study: No prior studies. Performing Technologist: Chanda Busing RVT  Examination Guidelines: A complete evaluation includes B-mode imaging, spectral Doppler, color Doppler, and power Doppler as needed of all accessible portions of each vessel. Bilateral testing is considered an integral part of a complete examination. Limited examinations for reoccurring indications may be performed as noted.  +---------+---------------+---------+-----------+----------+--------------+ RIGHT    CompressibilityPhasicitySpontaneityPropertiesThrombus Aging +---------+---------------+---------+-----------+----------+--------------+ CFV      Full           Yes      Yes                                 +---------+---------------+---------+-----------+----------+--------------+ SFJ      Full                                                        +---------+---------------+---------+-----------+----------+--------------+ FV Prox                                               Not visualized +---------+---------------+---------+-----------+----------+--------------+ FV Mid                                                Not visualized +---------+---------------+---------+-----------+----------+--------------+ FV Distal                                             Not visualized +---------+---------------+---------+-----------+----------+--------------+ PFV  Not visualized +---------+---------------+---------+-----------+----------+--------------+ POP      Full           Yes      Yes                                 +---------+---------------+---------+-----------+----------+--------------+ PTV      Full                                                         +---------+---------------+---------+-----------+----------+--------------+ PERO                                                  Not visualized +---------+---------------+---------+-----------+----------+--------------+   +---------+---------------+---------+-----------+----------+--------------+ LEFT     CompressibilityPhasicitySpontaneityPropertiesThrombus Aging +---------+---------------+---------+-----------+----------+--------------+ CFV      Full           Yes      Yes                                 +---------+---------------+---------+-----------+----------+--------------+ SFJ      Full                                                        +---------+---------------+---------+-----------+----------+--------------+ FV Prox  Full                                                        +---------+---------------+---------+-----------+----------+--------------+ FV Mid                                                Not visualized +---------+---------------+---------+-----------+----------+--------------+ FV Distal                                             Not visualized +---------+---------------+---------+-----------+----------+--------------+ PFV                                                   Not visualized +---------+---------------+---------+-----------+----------+--------------+ POP      Full           Yes      Yes                                 +---------+---------------+---------+-----------+----------+--------------+ PTV  Not visualized +---------+---------------+---------+-----------+----------+--------------+ PERO                                                  Not visualized +---------+---------------+---------+-----------+----------+--------------+     Summary: Right: There is no evidence of deep vein thrombosis in the lower extremity. However, portions of this  examination were limited- see technologist comments above. No cystic structure found in the popliteal fossa. Left: There is no evidence of deep vein thrombosis in the lower extremity. However, portions of this examination were limited- see technologist comments above. No cystic structure found in the popliteal fossa.  *See table(s) above for measurements and observations. Electronically signed by Coral Else MD on 03/22/2019 at 4:46:01 PM.    Final     Lab Data:  CBC: Recent Labs  Lab 03/20/19 1724 03/21/19 0424 03/22/19 0433 03/23/19 0438  WBC 10.9* 11.1* 16.0* 16.7*  NEUTROABS 7.9*  --   --   --   HGB 10.7* 11.1* 11.3* 11.2*  HCT 35.9* 38.7 38.2 39.0  MCV 98.6 100.8* 97.9 98.5  PLT 259 247 249 256   Basic Metabolic Panel: Recent Labs  Lab 03/20/19 1724 03/21/19 0424 03/22/19 0433 03/23/19 0438  NA 139 140 136 137  K 4.6 4.2 3.5 3.7  CL 94* 95* 90* 90*  CO2 37* 39* 37* 37*  GLUCOSE 107* 140* 123* 97  BUN CREATININE 0.93 1.03* 0.81 0.81  CALCIUM 11.3* 10.7* 10.6* 10.6*   GFR: Estimated Creatinine Clearance: 121.5 mL/min (by C-G formula based on SCr of 0.81 mg/dL). Liver Function Tests: No results for input(s): AST, ALT, ALKPHOS, BILITOT, PROT, ALBUMIN in the last 168 hours. Recent Labs  Lab 03/22/19 1109 03/23/19 0438  LIPASE 189* 108*   No results for input(s): AMMONIA in the last 168 hours. Coagulation Profile: No results for input(s): INR, PROTIME in the last 168 hours. Cardiac Enzymes: No results for input(s): CKTOTAL, CKMB, CKMBINDEX, TROPONINI in the last 168 hours. BNP (last 3 results) No results for input(s): PROBNP in the last 8760 hours. HbA1C: No results for input(s): HGBA1C in the last 72 hours. CBG: No results for input(s): GLUCAP in the last 168 hours. Lipid Profile: Recent Labs    03/23/19 0438  CHOL 148  HDL 59  LDLCALC 77  TRIG 59  CHOLHDL 2.5   Thyroid Function Tests: No results for input(s): TSH, T4TOTAL, FREET4,  T3FREE, THYROIDAB in the last 72 hours. Anemia Panel: No results for input(s): VITAMINB12, FOLATE, FERRITIN, TIBC, IRON, RETICCTPCT in the last 72 hours. Urine analysis: No results found for: COLORURINE, APPEARANCEUR, LABSPEC, PHURINE, GLUCOSEU, HGBUR, BILIRUBINUR, KETONESUR, PROTEINUR, UROBILINOGEN, NITRITE, LEUKOCYTESUR   Tyreesha Maharaj M.D. [facility] 03/23/2019, 11:21 AM   Call night coverage person covering after 7pm

## 2019-03-23 NOTE — TOC Initial Note (Signed)
Transition of Care [facility]) - Initial/Assessment Note    Patient Details  Name: Janice Ryan MRN: [id number] Date of Birth: 1955-10-30  Transition of Care (TOC) CM/SW Contact:    Armanda Heritage, RN Phone Number: 03/23/2019, 2:52 PM  Clinical Narrative:                 [facility] consult for medication assistance received. CM will follow for discharge medication needs and assist as needed.  Expected Discharge Plan: Home/Self Care Barriers to Discharge: Continued Medical Work up   Patient Goals and CMS Choice        Expected Discharge Plan and Services Expected Discharge Plan: Home/Self Care   Discharge Planning Services: CM Consult   Living arrangements for the past 2 months: Single Family Home                                      Prior Living Arrangements/Services Living arrangements for the past 2 months: Single Family Home Lives with:: Spouse Patient language and need for interpreter reviewed:: Yes Do you feel safe going back to the place where you live?: Yes      Need for Family Participation in Patient Care: Yes (Comment) Care giver support system in place?: Yes (comment)   Criminal Activity/Legal Involvement Pertinent to Current Situation/Hospitalization: No - Comment as needed  Activities of Daily Living Home Assistive Devices/Equipment: Walker (specify type), Oxygen, Blood pressure cuff, Shower chair with back(four wheels walker) ADL Screening (condition at time of admission) Patient's cognitive ability adequate to safely complete daily activities?: Yes Is the patient deaf or have difficulty hearing?: No Does the patient have difficulty seeing, even when wearing glasses/contacts?: No Does the patient have difficulty concentrating, remembering, or making decisions?: No Patient able to express need for assistance with ADLs?: Yes Does the patient have difficulty dressing or bathing?: Yes Independently performs ADLs?: No Communication:  Independent Dressing (OT): Needs assistance Is this a change from baseline?: Pre-admission baseline Grooming: Needs assistance Is this a change from baseline?: Pre-admission baseline Feeding: Independent Bathing: Needs assistance Is this a change from baseline?: Pre-admission baseline Toileting: Needs assistance Is this a change from baseline?: Pre-admission baseline In/Out Bed: Needs assistance Is this a change from baseline?: Pre-admission baseline Walks in Home: Independent with device (comment), Needs assistance Does the patient have difficulty walking or climbing stairs?: Yes Weakness of Legs: Both Weakness of Arms/Hands: None  Permission Sought/Granted                  Emotional Assessment           Psych Involvement: No (comment)  Admission diagnosis:  Cellulitis of both lower extremities [L03.115, L03.116] Cellulitis [L03.90] Patient Active Problem List   Diagnosis Date Noted  . Cellulitis 03/21/2019  . Cellulitis of both lower extremities 03/20/2019  . Chronic respiratory failure with hypoxia (HCC) 12/10/2018  . HTN (hypertension) 12/10/2018  . GAD (generalized anxiety disorder) 12/10/2018  . Chronic pain of right knee   . Moderate persistent asthma 07/28/2018   PCP:  Cain Saupe, MD Pharmacy:   [facility] - [place], [place] - [address] [place] [address] Phone: [phone] Fax: [phone]  [facility] - [place], [place] - [address] [place] [address] Phone: [phone] Fax: [phone]  [facility] -  [place], [address] [place] [address] Phone: [phone] Fax: [phone]     Social Determinants of Health (SDOH) Interventions    Readmission Risk Interventions Readmission Risk Prevention Plan 07/31/2018  Post Dischage Appt Complete  Medication  Screening Complete  Transportation Screening Complete  Some recent data might be hidden

## 2019-03-23 NOTE — Progress Notes (Signed)
PT Cancellation Note  Patient Details Name: Janice Ryan MRN: [id number] DOB: 1956/03/08   Cancelled Treatment:    Reason Eval/Treat Not Completed: Attempted PT eval-pt declined to participate at this time 2* fatigue/feeling drowsy.Will check back as schedule allows.   Doreatha Massed, PT Acute Rehabilitation Services

## 2019-03-23 NOTE — Progress Notes (Signed)
Pt. Continues to decline use of CPAP

## 2019-03-23 NOTE — Evaluation (Signed)
Physical Therapy Evaluation Patient Details Name: Janice Ryan MRN: [id number] DOB: Oct 26, 1955 Today's Date: 03/23/2019   History of Present Illness  63 yo female admitted with LE cellulitis. Hx of morbid obesity, asthma, anxiety, chronic R knee pain  Clinical Impression  On eval, pt required Mod assist +2 for standing and Min assist +2 to ambulate ~7 feet with a RW. O2 85% on 3L [place] with activity. Pt presents with general weakness, decreased activity tolerance, and impaired gait and balance. Pt fatigues easily with activity. Discussed d/c plan-pt plans to return home. She will likely decline SNF placement. Will follow and progress activity as tolerated.     Follow Up Recommendations SNF(If pt refuses, then HHPT 24 hour supervision/assist)    Equipment Recommendations  None recommended by PT    Recommendations for Other Services       Precautions / Restrictions Precautions Precautions: Fall Precaution Comments: O2 dep 2-3L @ baseline Restrictions Weight Bearing Restrictions: No      Mobility  Bed Mobility               General bed mobility comments: oob in recliner  Transfers Overall transfer level: Needs assistance Equipment used: Rolling walker (2 wheeled) Transfers: Sit to/from Stand Sit to Stand: Mod assist;+2 physical assistance;+2 safety/equipment         General transfer comment: Assist to power up, control descent. VCs safety, technique.  Ambulation/Gait Ambulation/Gait assistance: Min assist;+2 physical assistance;+2 safety/equipment Gait Distance (Feet): 7 Feet Assistive device: Rolling walker (2 wheeled) Gait Pattern/deviations: Step-through pattern;Decreased stride length;Wide base of support     General Gait Details: Assist to stabilize pt. Dyspnea 3/4. O2 85% on 3L [place] O2 with short distance.  Stairs            Wheelchair Mobility    Modified Rankin (Stroke Patients Only)       Balance Overall balance assessment: Needs  assistance         Standing balance support: Bilateral upper extremity supported Standing balance-Leahy Scale: Poor                               Pertinent Vitals/Pain Pain Assessment: Faces Faces Pain Scale: Hurts even more Pain Location: R knee Pain Descriptors / Indicators: Sore;Discomfort Pain Intervention(s): Limited activity within patient's tolerance;Monitored during session    Home Living Family/patient expects to be discharged to:: Private residence Living Arrangements: Spouse/significant other;Children Available Help at Discharge: Family;Available 24 hours/day Type of Home: Apartment Home Access: Stairs to enter Entrance Stairs-Rails: Can reach both Entrance Stairs-Number of Steps: 12 Home Layout: One level Home Equipment: Clinical cytogeneticist - 2 wheels;Walker - 4 wheels;Wheelchair - manual      Prior Function Level of Independence: Independent         Comments: 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, psychology teacher, 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, driving      Hand Dominance   Dominant Hand: Right    Extremity/Trunk Assessment   Upper Extremity Assessment Upper Extremity Assessment: Generalized weakness    Lower Extremity Assessment Lower Extremity Assessment: Generalized weakness    Cervical / Trunk Assessment Cervical / Trunk Assessment: Normal  Communication   Communication: No difficulties  Cognition Arousal/Alertness: Awake/alert Behavior During Therapy: WFL for tasks assessed/performed Overall Cognitive Status: Within Functional Limits for tasks assessed  General Comments      Exercises     Assessment/Plan    PT Assessment Patient needs continued PT services  PT Problem List Decreased strength;Decreased range of motion;Decreased mobility;Obesity;Decreased activity tolerance;Decreased balance;Decreased knowledge of use of DME;Pain       PT Treatment  Interventions DME instruction;Gait training;Therapeutic exercise;Therapeutic activities;Patient/family education;Balance training;Functional mobility training    PT Goals (Current goals can be found in the Care Plan section)  Acute Rehab PT Goals Patient Stated Goal: home soon PT Goal Formulation: With patient Time For Goal Achievement: 04/06/19 Potential to Achieve Goals: Fair    Frequency Min 3X/week   Barriers to discharge        Co-evaluation               AM-PAC PT "6 Clicks" Mobility  Outcome Measure Help needed turning from your back to your side while in a flat bed without using bedrails?: A Lot Help needed moving from lying on your back to sitting on the side of a flat bed without using bedrails?: A Lot Help needed moving to and from a bed to a chair (including a wheelchair)?: A Lot Help needed standing up from a chair using your arms (e.g., wheelchair or bedside chair)?: A Lot Help needed to walk in hospital room?: A Lot Help needed climbing 3-5 steps with a railing? : A Lot 6 Click Score: 12    End of Session Equipment Utilized During Treatment: Oxygen Activity Tolerance: Patient limited by fatigue Patient left: in chair;with call bell/phone within reach   PT Visit Diagnosis: Muscle weakness (generalized) (M62.81);Difficulty in walking, not elsewhere classified (R26.2);Pain Pain - Right/Left: Right Pain - part of body: Knee    Time: 4315-4008 PT Time Calculation (min) (ACUTE ONLY): 12 min   Charges:   PT Evaluation $PT Eval Moderate Complexity: 1 Mod             Faye Ramsay, PT Acute Rehabilitation Services

## 2019-03-23 NOTE — Plan of Care (Deleted)
  Problem: Clinical Measurements: Goal: Ability to avoid or minimize complications of infection will improve 03/23/2019 2242 by Ladoris Gene, RN Outcome: Progressing 03/23/2019 2242 by Ladoris Gene, RN Outcome: Progressing   Problem: Skin Integrity: Goal: Skin integrity will improve 03/23/2019 2242 by Ladoris Gene, RN Outcome: Progressing 03/23/2019 2242 by Ladoris Gene, RN Outcome: Progressing   Problem: Education: Goal: Knowledge of General Education information will improve Description: Including pain rating scale, medication(s)/side effects and non-pharmacologic comfort measures 03/23/2019 2242 by Ladoris Gene, RN Outcome: Progressing 03/23/2019 2242 by Ladoris Gene, RN Outcome: Progressing   Problem: Health Behavior/Discharge Planning: Goal: Ability to manage health-related needs will improve 03/23/2019 2242 by Ladoris Gene, RN Outcome: Progressing 03/23/2019 2242 by Ladoris Gene, RN Outcome: Progressing   Problem: Activity: Goal: Risk for activity intolerance will decrease 03/23/2019 2242 by Ladoris Gene, RN Outcome: Progressing 03/23/2019 2242 by Ladoris Gene, RN Outcome: Progressing   Problem: Nutrition: Goal: Adequate nutrition will be maintained 03/23/2019 2242 by Ladoris Gene, RN Outcome: Progressing 03/23/2019 2242 by Ladoris Gene, RN Outcome: Progressing   Problem: Coping: Goal: Level of anxiety will decrease 03/23/2019 2242 by Ladoris Gene, RN Outcome: Progressing 03/23/2019 2242 by Ladoris Gene, RN Outcome: Progressing   Problem: Elimination: Goal: Will not experience complications related to bowel motility 03/23/2019 2242 by Ladoris Gene, RN Outcome: Progressing 03/23/2019 2242 by Ladoris Gene, RN Outcome: Progressing Goal: Will not experience complications related to urinary retention 03/23/2019 2242 by Ladoris Gene, RN Outcome: Progressing 03/23/2019 2242 by Ladoris Gene, RN Outcome: Progressing   Problem: Pain Managment: Goal: General experience of comfort will improve 03/23/2019 2242 by Ladoris Gene, RN Outcome: Progressing 03/23/2019 2242 by Ladoris Gene, RN Outcome: Progressing   Problem: Safety: Goal: Ability to remain free from injury will improve 03/23/2019 2242 by Ladoris Gene, RN Outcome: Progressing 03/23/2019 2242 by Ladoris Gene, RN Outcome: Progressing

## 2019-03-24 ENCOUNTER — Inpatient Hospital Stay (HOSPITAL_COMMUNITY): Payer: Self-pay

## 2019-03-24 DIAGNOSIS — J9621 Acute and chronic respiratory failure with hypoxia: Secondary | ICD-10-CM

## 2019-03-24 DIAGNOSIS — G4733 Obstructive sleep apnea (adult) (pediatric): Secondary | ICD-10-CM

## 2019-03-24 DIAGNOSIS — Z9119 Patient's noncompliance with other medical treatment and regimen: Secondary | ICD-10-CM

## 2019-03-24 LAB — CBC
HCT: 36.1 % (ref 36.0–46.0)
Hemoglobin: 10.7 g/dL — ABNORMAL LOW (ref 12.0–15.0)
MCH: 29.1 pg (ref 26.0–34.0)
MCHC: 29.6 g/dL — ABNORMAL LOW (ref 30.0–36.0)
MCV: 98.1 fL (ref 80.0–100.0)
Platelets: 249 10*3/uL (ref 150–400)
RBC: 3.68 MIL/uL — ABNORMAL LOW (ref 3.87–5.11)
RDW: 13.1 % (ref 11.5–15.5)
WBC: 15.9 10*3/uL — ABNORMAL HIGH (ref 4.0–10.5)
nRBC: 0 % (ref 0.0–0.2)

## 2019-03-24 MED ORDER — TRAMADOL HCL 50 MG PO TABS
50.0000 mg | ORAL_TABLET | Freq: Two times a day (BID) | ORAL | Status: DC | PRN
  Administered 2019-03-24 – 2019-03-25 (×2): 50 mg via ORAL
  Filled 2019-03-24 (×3): qty 1

## 2019-03-24 MED ORDER — DICLOFENAC SODIUM 1 % EX GEL
2.0000 g | Freq: Two times a day (BID) | CUTANEOUS | Status: DC | PRN
  Administered 2019-03-28: 2 g via TOPICAL
  Filled 2019-03-24 (×2): qty 100

## 2019-03-24 NOTE — Progress Notes (Signed)
PROGRESS NOTE    Janice Ryan  RUE:[id number]  DOB: 05/30/1955  PCP: Cain Saupe, MD Admit date:03/20/2019  63 year old female with history of asthma, chronic respiratory failure on 2.5 -3 L O2 at home,HTN, generalized anxiety, morbid obesity presented with erythema and pain in bilateral lower extremities x 2 weeks. Patient had a telemetry visit with her PCP on 12/15 for symptoms of itching and burning on both of her feet refractory to amoxicillin 250 mg daily prescribed through an ED visit.Patient felt that her reported cellulitis was not improving and antibiotics were switched to Keflex which she had not picked up. ED Course: Afebrile.BP 140/62, pulse 90, RR 17,SPO2 99% on 2 L of O2-WBC 10.9, hemoglobin 10.7, platelets 259,000, sodium 139, potassium 4.6, bicarb 37, BUN 18, creatinine 0.93, serum glucose 107, calcium 11.3.COVID-19 test negative.Patient was given IV vancomycin, IV Lasix 40 mg with oral potassium, Ativan, gabapentin, morphine after which she was noted to be hypersomnolent. Hospital course: Patient admitted to [facility] for further evaluation and management. She has been treated with IV rocephin for LE redness/presumed cellulitis. IV flagyl ordered on 12/19 in concern for CT findings of colitis/pancreatitis and leucocytosis of 16k. Patient reports vomiting x 3 at home and once while here. Diet advanced to regular diet now. HC complicated by patient's hypersomnolence and noncompliance with CPAP use.  Subjective: Patient resting comfortably. Appears somnolent and somewhat tachypneic while talking full sentences. She however appears to be saturating well on 2lits o2 via [place]. She is asking for opiates to help with abdominal pain (which she had denied earlier). Per nurse, patient has tramadol order but been unable to give as patient drowsy, hypersomnolent and been refusing to wear CPAP.   Objective: Vitals:   03/23/19 2035 03/24/19 0620 03/24/19 0755 03/24/19 1351  BP: (!) 124/103  128/73  (!) 143/65  Pulse: 99 90  90  Resp: 16 19  15   Temp: 98 F (36.7 C) 98.6 F (37 C)  98 F (36.7 C)  TempSrc: Oral Oral  Oral  SpO2: 95% 94% 94% 91%  Weight:      Height:        Intake/Output Summary (Last 24 hours) at 03/24/2019 1555 Last data filed at 03/24/2019 1350 Gross per 24 hour  Intake 415.08 ml  Output 800 ml  Net -384.92 ml   Filed Weights   03/20/19 1500  Weight: (!) 167.8 kg    Physical Examination:  General exam: Appears calm and comfortable  Respiratory system: Clear to auscultation. Respiratory effort normal. Cardiovascular system: S1 & S2 heard, RRR. No JVD, murmurs, rubs, gallops or clicks. No pedal edema. Gastrointestinal system: Abdomen is obese,nondistended, soft and ?tender in epigastrium. Normal bowel sounds heard. Central nervous system: Somnolent/drowsy. No new focal neurological deficits. Extremities: No contractures, or joint deformities. Chronic stasis lymphedema but no signs of inflammation like redness/tenderness currently Skin: No rashes, lesions or ulcers Psychiatry: Judgement and insight appear normal. Somnolent  Data Reviewed: I have personally reviewed following labs and imaging studies  CBC: Recent Labs  Lab 03/20/19 1724 03/21/19 0424 03/22/19 0433 03/23/19 0438 03/24/19 0420  WBC 10.9* 11.1* 16.0* 16.7* 15.9*  NEUTROABS 7.9*  --   --   --   --   HGB 10.7* 11.1* 11.3* 11.2* 10.7*  HCT 35.9* 38.7 38.2 39.0 36.1  MCV 98.6 100.8* 97.9 98.5 98.1  PLT 259 247 249 256 249   Basic Metabolic Panel: Recent Labs  Lab 03/20/19 1724 03/21/19 0424 03/22/19 0433 03/23/19 0438  NA 139 140 136  137  K 4.6 4.2 3.5 3.7  CL 94* 95* 90* 90*  CO2 37* 39* 37* 37*  GLUCOSE 107* 140* 123* 97  BUN 18 16 13 12   CREATININE 0.93 1.03* 0.81 0.81  CALCIUM 11.3* 10.7* 10.6* 10.6*   GFR: Estimated Creatinine Clearance: 121.5 mL/min (by C-G formula based on SCr of 0.81 mg/dL). Liver Function Tests: No results for input(s): AST, ALT,  ALKPHOS, BILITOT, PROT, ALBUMIN in the last 168 hours. Recent Labs  Lab 03/22/19 1109 03/23/19 0438  LIPASE 189* 108*   No results for input(s): AMMONIA in the last 168 hours. Coagulation Profile: No results for input(s): INR, PROTIME in the last 168 hours. Cardiac Enzymes: No results for input(s): CKTOTAL, CKMB, CKMBINDEX, TROPONINI in the last 168 hours. BNP (last 3 results) No results for input(s): PROBNP in the last 8760 hours. HbA1C: No results for input(s): HGBA1C in the last 72 hours. CBG: No results for input(s): GLUCAP in the last 168 hours. Lipid Profile: Recent Labs    03/23/19 0438  CHOL 148  HDL 59  LDLCALC 77  TRIG 59  CHOLHDL 2.5   Thyroid Function Tests: No results for input(s): TSH, T4TOTAL, FREET4, T3FREE, THYROIDAB in the last 72 hours. Anemia Panel: No results for input(s): VITAMINB12, FOLATE, FERRITIN, TIBC, IRON, RETICCTPCT in the last 72 hours. Sepsis Labs: No results for input(s): PROCALCITON, LATICACIDVEN in the last 168 hours.  Recent Results (from the past 240 hour(s))  SARS CORONAVIRUS 2 (TAT 6-24 HRS) Nasopharyngeal Nasopharyngeal Swab     Status: None   Collection Time: 03/20/19  5:24 PM   Specimen: Nasopharyngeal Swab  Result Value Ref Range Status   SARS Coronavirus 2 NEGATIVE NEGATIVE Final    Comment: (NOTE) SARS-CoV-2 target nucleic acids are NOT DETECTED. The SARS-CoV-2 RNA is generally detectable in upper and lower respiratory specimens during the acute phase of infection. Negative results do not preclude SARS-CoV-2 infection, do not rule out co-infections with other pathogens, and should not be used as the sole basis for treatment or other patient management decisions. Negative results must be combined with clinical observations, patient history, and epidemiological information. The expected result is Negative. Fact Sheet for Patients: HairSlick.nohttps://www.fda.gov/media/138098/download Fact Sheet for Healthcare  Providers: quierodirigir.comhttps://www.fda.gov/media/138095/download This test is not yet approved or cleared by the [place] FDA and  has been authorized for detection and/or diagnosis of SARS-CoV-2 by FDA under an Emergency Use Authorization (EUA). This EUA will remain  in effect (meaning this test can be used) for the duration of the COVID-19 declaration under Section 56 4(b)(1) of the Act, 21 U.S.C. section 360bbb-3(b)(1), unless the authorization is terminated or revoked sooner. Performed at [facility], [address] [place], [place] [address]       Radiology Studies: No results found.      Scheduled Meds: . albuterol  2.5 mg Nebulization BID  . ALPRAZolam  0.5 mg Oral Daily  . busPIRone  5 mg Oral TID  . DULoxetine  60 mg Oral Daily  . enoxaparin (LOVENOX) injection  80 mg Subcutaneous QHS  . fluticasone  1 spray Each Nare Daily  . lisinopril  10 mg Oral Daily  . loratadine  10 mg Oral Daily  . mometasone-formoterol  2 puff Inhalation BID  . montelukast  10 mg Oral QHS   Continuous Infusions: . cefTRIAXone (ROCEPHIN)  IV 2 g (03/23/19 2331)  . metronidazole 500 mg (03/24/19 1537)    Assessment & Plan:   Cellulitis vs reactive lymphangitis of both  lower extremities with underlying chronic venous stasis: -Patient had erythema of both lower extremities, up to knees, also venous stasis-Doppler ultrasound of the lower extremities negative for DVT-Much improved, been on IV Rocephin since admission, also received Lasix in ED. White count was 10.9 on presentation, peaked to 16.7 and today at 15.9k. Afebrile with Tmax 99.6 during hospital course.  Acute on chronic hypoxic respiratory failure: chronically on 2.5 to 3 L at home and currently at baseline. Patient has required 5 to 6 L O2 during hospitalization possibly related to multiple pain meds/sedatives she received in ED with underlying asthma,sleep apnea and OHS-non compliant with CPAP. COVID-19 test negative- No evidence  of acute pulmonary edema but patient started on Lasix for leg edema. 2D echo 07/2018 had shown EF > 65% with impaired relaxation, grade 1 diastolic CHF,D-dimer 1.28.-NO angiogram of the chest showed no PE but reported well-circumscribed solid nodule versus complex cyst in the anterior mediastinum, stable for last 8 months. Follow-up in 12 months. Currently appears tachypneic -likely due to polypharmacy/hypersomnolence-will obtain CXR given leucocytosis and reported vomiting to r/o aspiration.Encourage IS-discussed with bedside nurse.  CT abnormality suggestive of pancreatic/colonic inflammation: CT chest incidentally reported new inflammatory stranding adjacent to tail of pancreas and splenic flexure suggestive of possible colitis diverticulitis or pancreatitis. Although Lipase elevated at 189, no acute abdominal complaints, nausea vomiting or diarrhea--findings could be related  To hypercalcemia or HCTZ that patient takes at home. Patient was placed on full liquids then increased to heart healthy, tolerating well.  Lasix was held, Flagyl was added on 12/19 for possible colitis. No fever but has had white count as discussed above. Although denied pain yesterday, reports abdominal discomfort today.Plan on only short course of antibiotics (5 day). Support stockings  Mild hypercalcemia- Likely related to chronic Thiazide use. Calcium 11.3 noted at the time of admission,currently improved to 10.6 after d/ced HCTZ. Check ionized calcium, PTH levels.No evidence of lung malignancy on CT chest.   Mediastinal nodule:Prior CTA chest 07/28/2018 showed a 2.8 x 2.2 cm nodule in the anterior mediastinum. It was recommended to have follow-up chest CT in 3-6 months or further characterization with PET CT. Unclear follow-up and imaging was pursued. CT chest done this admission shows stable nodule size.  AMS/Hypersomnolence: Likely due to polypharmacy ,could be related to CO2 narcosis as she has not been using CPAP while  here. Encouraged patient to use CPAP and declined to increase opiate /Xanax regimen which can cause worsening.   Right knee pain-States she fell at home and had difficulty bearing weight on the right knee. X-ray of the right knee showed severe osteoarthritis,no fracture. Will try local analgesic cream   Hypertension-Currently stable, continue lisinopril, hold diuretics  Generalized anxiety disorder:Continue BuSpar and Cymbalta.  Morbid obesity:BMI 56.2, counseled on diet and weight control   DVT prophylaxis: lovenox Code Status: Full  Family / Patient Communication: d/w paient, will call family Disposition Plan: Home likely in next 48 hours. Will f/u CXR results and repeat CBC in am     LOS: 3 days    Time spent: 35 minutes    Guilford Shi, MD [facility] Pager [phone]  If 7PM-7AM, please contact night-coverage [email or web address] Password [facility] 03/24/2019, 3:55 PM

## 2019-03-25 LAB — PTH, INTACT AND CALCIUM
Calcium, Total (PTH): 10.8 mg/dL — ABNORMAL HIGH (ref 8.7–10.3)
PTH: 29 pg/mL (ref 15–65)

## 2019-03-25 LAB — CALCIUM, IONIZED: Calcium, Ionized, Serum: 6 mg/dL — ABNORMAL HIGH (ref 4.5–5.6)

## 2019-03-25 MED ORDER — AMOXICILLIN-POT CLAVULANATE 875-125 MG PO TABS
1.0000 | ORAL_TABLET | Freq: Two times a day (BID) | ORAL | Status: DC
  Administered 2019-03-25 – 2019-04-01 (×13): 1 via ORAL
  Filled 2019-03-25 (×14): qty 1

## 2019-03-25 MED ORDER — ALPRAZOLAM 0.5 MG PO TABS
0.5000 mg | ORAL_TABLET | Freq: Every day | ORAL | Status: DC | PRN
  Administered 2019-03-25: 0.5 mg via ORAL
  Filled 2019-03-25: qty 1

## 2019-03-25 NOTE — Progress Notes (Signed)
Pt refusing CPAP at bedtime at this time.  RT provided education but pt still refusing, RT to monitor and assess as needed.

## 2019-03-25 NOTE — Progress Notes (Signed)
PROGRESS NOTE    Janice JulianDonna Ryan  ONG:[id number]  DOB: 09/02/1955  PCP: Cain SaupeFulp, Cammie, MD Admit date:03/20/2019  63 year old female with history of asthma, chronic respiratory failure on 2.5 -3 L O2 at home,HTN, generalized anxiety, morbid obesity presented with erythema and pain in bilateral lower extremities x 2 weeks. Patient had a telemetry visit with her PCP on 12/15 for symptoms of itching and burning on both of her feet refractory to amoxicillin 250 mg daily prescribed through an ED visit.Patient felt that her reported cellulitis was not improving and antibiotics were switched to Keflex which she had not picked up. ED Course: Afebrile.BP 140/62, pulse 90, RR 17,SPO2 99% on 2 L of O2-WBC 10.9, hemoglobin 10.7, platelets 259,000, sodium 139, potassium 4.6, bicarb 37, BUN 18, creatinine 0.93, serum glucose 107, calcium 11.3.COVID-19 test negative.Patient was given IV vancomycin, IV Lasix 40 mg with oral potassium, Ativan, gabapentin, morphine after which she was noted to be hypersomnolent. Hospital course: [facility] evaluation and management. She has been treated with IV rocephin for LE redness/presumed cellulitis. IV flagyl ordered on 12/19 in concern for CT findings of colitis/pancreatitis and leucocytosis of 16k. Patient reports vomiting x 3 at home and once while here. Diet advanced to regular diet now. HC complicated by patient's hypersomnolence and noncompliance with CPAP use.  Subjective: Patient out of bed to chair today and much more awake, alert. She is saturating well on 2lits o2 via . Uses 2.5 to 3 lits at home but only at night , does not have CPAP. She is now agreeable for SNF rehab placement.    Objective: Vitals:   03/25/19 0500 03/25/19 0556 03/25/19 0806 03/25/19 0807  BP:  (!) 175/83    Pulse:  90    Resp:  16    Temp:  98.3 F (36.8 C)    TempSrc:  Oral    SpO2:  90% 90% 90%  Weight: (!) 168 kg     Height:        Intake/Output  Summary (Last 24 hours) at 03/25/2019 1232 Last data filed at 03/25/2019 1007 Gross per 24 hour  Intake 918.04 ml  Output 500 ml  Net 418.04 ml   Filed Weights   03/20/19 1500 03/25/19 0500  Weight: (!) 167.8 kg (!) 168 kg    Physical Examination:  General exam: Appears calm and comfortable  Respiratory system: Clear to auscultation. Respiratory effort normal. Cardiovascular system: S1 & S2 heard, RRR. No JVD, murmurs, rubs, gallops or clicks. No pedal edema. Gastrointestinal system: Abdomen is obese,nondistended, soft and nontender . Normal bowel sounds heard. Central nervous system: Awake alert today. No new focal neurological deficits. Extremities: No contractures, or joint deformities. Chronic stasis lymphedema but no signs of inflammation like redness/tenderness currently Skin: No rashes, lesions or ulcers Psychiatry: Judgement and insight appear normal.  Data Reviewed: I have personally reviewed following labs and imaging studies  CBC: Recent Labs  Lab 03/20/19 1724 03/21/19 0424 03/22/19 0433 03/23/19 0438 03/24/19 0420  WBC 10.9* 11.1* 16.0* 16.7* 15.9*  NEUTROABS 7.9*  --   --   --   --   HGB 10.7* 11.1* 11.3* 11.2* 10.7*  HCT 35.9* 38.7 38.2 39.0 36.1  MCV 98.6 100.8* 97.9 98.5 98.1  PLT 259 247 249 256 249   Basic Metabolic Panel: Recent Labs  Lab 03/20/19 1724 03/21/19 0424 03/22/19 0433 03/23/19 0438 03/24/19 0420  NA 139 140 136 137  --   K 4.6 4.2 3.5 3.7  --  CL 94* 95* 90* 90*  --   CO2 37* 39* 37* 37*  --   GLUCOSE 107* 140* 123* 97  --   BUN 18 16 13 12   --   CREATININE 0.93 1.03* 0.81 0.81  --   CALCIUM 11.3* 10.7* 10.6* 10.6* 10.8*   GFR: Estimated Creatinine Clearance: 121.5 mL/min (by C-G formula based on SCr of 0.81 mg/dL). Liver Function Tests: No results for input(s): AST, ALT, ALKPHOS, BILITOT, PROT, ALBUMIN in the last 168 hours. Recent Labs  Lab 03/22/19 1109 03/23/19 0438  LIPASE 189* 108*   No results for input(s):  AMMONIA in the last 168 hours. Coagulation Profile: No results for input(s): INR, PROTIME in the last 168 hours. Cardiac Enzymes: No results for input(s): CKTOTAL, CKMB, CKMBINDEX, TROPONINI in the last 168 hours. BNP (last 3 results) No results for input(s): PROBNP in the last 8760 hours. HbA1C: No results for input(s): HGBA1C in the last 72 hours. CBG: No results for input(s): GLUCAP in the last 168 hours. Lipid Profile: Recent Labs    03/23/19 0438  CHOL 148  HDL 59  LDLCALC 77  TRIG 59  CHOLHDL 2.5   Thyroid Function Tests: No results for input(s): TSH, T4TOTAL, FREET4, T3FREE, THYROIDAB in the last 72 hours. Anemia Panel: No results for input(s): VITAMINB12, FOLATE, FERRITIN, TIBC, IRON, RETICCTPCT in the last 72 hours. Sepsis Labs: No results for input(s): PROCALCITON, LATICACIDVEN in the last 168 hours.  Recent Results (from the past 240 hour(s))  SARS CORONAVIRUS 2 (TAT 6-24 HRS) Nasopharyngeal Nasopharyngeal Swab     Status: None   Collection Time: 03/20/19  5:24 PM   Specimen: Nasopharyngeal Swab  Result Value Ref Range Status   SARS Coronavirus 2 NEGATIVE NEGATIVE Final    Comment: (NOTE) SARS-CoV-2 target nucleic acids are NOT DETECTED. The SARS-CoV-2 RNA is generally detectable in upper and lower respiratory specimens during the acute phase of infection. Negative results do not preclude SARS-CoV-2 infection, do not rule out co-infections with other pathogens, and should not be used as the sole basis for treatment or other patient management decisions. Negative results must be combined with clinical observations, patient history, and epidemiological information. The expected result is Negative. Fact Sheet for Patients: SugarRoll.be Fact Sheet for Healthcare Providers: https://www.woods-mathews.com/ This test is not yet approved or cleared by the [place] FDA and  has been authorized for detection and/or  diagnosis of SARS-CoV-2 by FDA under an Emergency Use Authorization (EUA). This EUA will remain  in effect (meaning this test can be used) for the duration of the COVID-19 declaration under Section 56 4(b)(1) of the Act, 21 U.S.C. section 360bbb-3(b)(1), unless the authorization is terminated or revoked sooner. Performed at [facility], [place] [address] [place], [place] [address]       Radiology Studies: DG CHEST PORT 1 VIEW  Result Date: 03/24/2019 CLINICAL DATA:  History of asthma.  Chronic respiratory failure. EXAM: PORTABLE CHEST 1 VIEW COMPARISON:  CT 03/22/2019.  Portable chest 07/27/2018. FINDINGS: Cardiomegaly with prominent central pulmonary arteries. This suggest possibility of pulmonary hypertension. Mild left mid lung field subsegmental atelectasis. No pleural effusion or pneumothorax. Reference is made to CT chest report 03/22/2019 for discussion of small anterior mediastinal lesion. IMPRESSION: 1. Cardiomegaly with prominent central pulmonary arteries. This suggest possibility of pulmonary hypertension. 2.  Mild left mid lung field subsegmental atelectasis. 3. Reference is made to CT chest report of 03/22/2019 for discussion of small anterior mediastinal lesion. Electronically Signed   By: Marcello Moores  Register   On: 03/24/2019 17:04        Scheduled Meds: . albuterol  2.5 mg Nebulization BID  . amoxicillin-clavulanate  1 tablet Oral Q12H  . busPIRone  5 mg Oral TID  . DULoxetine  60 mg Oral Daily  . enoxaparin (LOVENOX) injection  80 mg Subcutaneous QHS  . fluticasone  1 spray Each Nare Daily  . lisinopril  10 mg Oral Daily  . loratadine  10 mg Oral Daily  . mometasone-formoterol  2 puff Inhalation BID  . montelukast  10 mg Oral QHS   Continuous Infusions:   Assessment & Plan:   Cellulitis vs reactive lymphangitis of both lower extremities with underlying chronic venous stasis: -Patient had erythema of both lower extremities, up to knees, also venous  stasis-Doppler ultrasound of the lower extremities negative for DVT-Much improved, been on IV Rocephin since admission, also received Lasix in ED. White count was 10.9 on presentation, peaked to 16.7 and now at 15.9k. Afebrile with Tmax 99.6 during hospital course.Repeat CBC  Acute on chronic hypoxic respiratory failure: chronically on 2.5 to 3 L at home and currently at baseline. Patient has required 5 to 6 L O2 during hospitalization possibly related to multiple pain meds/sedatives she received in ED with underlying asthma,sleep apnea and OHS-non compliant with CPAP. COVID-19 test negative- No evidence of acute pulmonary edema but patient started on Lasix for leg edema. 2D echo 07/2018 had shown EF > 65% with impaired relaxation, grade 1 diastolic CHF,D-dimer 4.59.-CT angiogram of the chest showed no PE but reported well-circumscribed solid nodule versus complex cyst in the anterior mediastinum, stable for last 8 months. Follow-up in 12 months.  CXR done yesterday suggestive of atelectasis/pulmonary HTN, Prior echo -poor technique but suggestive of RV dysfunction.Marland KitchenEncourage IS-discussed with bedside nurse.  CT abnormality suggestive of pancreatic/colonic inflammation: CT chest incidentally reported new inflammatory stranding adjacent to tail of pancreas and splenic flexure suggestive of possible colitis diverticulitis or pancreatitis. Although Lipase elevated at 189, no acute abdominal complaints, nausea vomiting or diarrhea--findings could be related  To hypercalcemia or HCTZ that patient takes at home. Patient was placed on full liquids then increased to heart healthy, tolerating well.  Lasix was held, Flagyl was added on 12/19 for possible colitis. No fever but has had white count as discussed above. .Plan on only short course of antibiotics (5 day)--changed to Augmentin.   Mild hypercalcemia- Likely related to chronicThiazide use. Calcium 11.3 noted at the time of admission,currently improved to  10.6 after d/ced HCTZ. Ionized calcium slightly up at 6, PTH levels wnl.No evidence of lung malignancy on CT chest.   Mediastinal nodule:Prior CTA chest 07/28/2018 showed a 2.8 x 2.2 cm nodule in the anterior mediastinum. It was recommended to have follow-up chest CT in 3-6 months or further characterization with PET CT. Unclear follow-up and imaging was pursued. CT chest done this admission shows stable nodule size.  AMS/Hypersomnolence: Likely due to polypharmacy ,could be related to CO2 narcosis as she has not been using CPAP while here. Encouraged patient to use CPAP and declined to increase opiate /Xanax regimen which can cause worsening.   Right knee pain-States she fell at home and had difficulty bearing weight on the right knee. X-ray of the right knee showed severe osteoarthritis,no fracture. Will try local analgesic cream   Hypertension-Currently stable, continue lisinopril, hold diuretics  Generalized anxiety disorder:Continue BuSpar and Cymbalta.  Morbid obesity:BMI 56.2, counseled on diet and weight control   DVT prophylaxis: lovenox Code Status: Full  Family / Patient Communication: d/w paient, called husband yesterday-left a message Disposition Plan: PT recommends SNF and patient agreeable.     LOS: 4 days    Time spent: 35 minutes    Alessandra Bevels, MD [facility] Pager [phone]  If 7PM-7AM, please contact night-coverage [email or web address] Password [facility] 03/25/2019, 12:32 PM

## 2019-03-25 NOTE — Progress Notes (Signed)
PT Cancellation Note  Patient Details Name: Janice Ryan MRN: [id number] DOB: 31-Dec-1955   Cancelled Treatment:     Pt OOB in recliner declined amb with multiple reasons.  Pt has been evaluated with rec for [place]  PTA Acute  Rehabilitation Services Pager      [phone] Office      [phone]

## 2019-03-26 ENCOUNTER — Inpatient Hospital Stay (HOSPITAL_COMMUNITY): Payer: Self-pay

## 2019-03-26 ENCOUNTER — Encounter (HOSPITAL_COMMUNITY): Payer: Self-pay | Admitting: Internal Medicine

## 2019-03-26 DIAGNOSIS — R0689 Other abnormalities of breathing: Secondary | ICD-10-CM

## 2019-03-26 DIAGNOSIS — J9622 Acute and chronic respiratory failure with hypercapnia: Secondary | ICD-10-CM

## 2019-03-26 DIAGNOSIS — I2729 Other secondary pulmonary hypertension: Secondary | ICD-10-CM

## 2019-03-26 LAB — CBC
HCT: 37.3 % (ref 36.0–46.0)
Hemoglobin: 11 g/dL — ABNORMAL LOW (ref 12.0–15.0)
MCH: 29 pg (ref 26.0–34.0)
MCHC: 29.5 g/dL — ABNORMAL LOW (ref 30.0–36.0)
MCV: 98.4 fL (ref 80.0–100.0)
Platelets: 288 10*3/uL (ref 150–400)
RBC: 3.79 MIL/uL — ABNORMAL LOW (ref 3.87–5.11)
RDW: 12.9 % (ref 11.5–15.5)
WBC: 10 10*3/uL (ref 4.0–10.5)
nRBC: 0 % (ref 0.0–0.2)

## 2019-03-26 LAB — BLOOD GAS, ARTERIAL
Acid-Base Excess: 13.2 mmol/L — ABNORMAL HIGH (ref 0.0–2.0)
Acid-Base Excess: 15.5 mmol/L — ABNORMAL HIGH (ref 0.0–2.0)
Bicarbonate: 42.1 mmol/L — ABNORMAL HIGH (ref 20.0–28.0)
Bicarbonate: 43.7 mmol/L — ABNORMAL HIGH (ref 20.0–28.0)
Delivery systems: POSITIVE
Delivery systems: POSITIVE
Drawn by: 331411
Drawn by: 295031
FIO2: 30
O2 Content: 4 L/min
O2 Saturation: 96.1 %
O2 Saturation: 91.8 %
Patient temperature: 98.6
Patient temperature: 98.6
RATE: 18 resp/min
pCO2 arterial: 85 mmHg (ref 32.0–48.0)
pCO2 arterial: 78.7 mmHg (ref 32.0–48.0)
pH, Arterial: 7.316 — ABNORMAL LOW (ref 7.350–7.450)
pH, Arterial: 7.363 (ref 7.350–7.450)
pO2, Arterial: 86.6 mmHg (ref 83.0–108.0)
pO2, Arterial: 64.5 mmHg — ABNORMAL LOW (ref 83.0–108.0)

## 2019-03-26 LAB — ECHOCARDIOGRAM COMPLETE
Height: 68 in
Weight: 5925.96 oz

## 2019-03-26 LAB — BASIC METABOLIC PANEL
Anion gap: 8 (ref 5–15)
BUN: 13 mg/dL (ref 8–23)
CO2: 42 mmol/L — ABNORMAL HIGH (ref 22–32)
Calcium: 10.6 mg/dL — ABNORMAL HIGH (ref 8.9–10.3)
Chloride: 89 mmol/L — ABNORMAL LOW (ref 98–111)
Creatinine, Ser: 0.7 mg/dL (ref 0.44–1.00)
GFR calc Af Amer: >60 mL/min (ref >60)
GFR calc non Af Amer: >60 mL/min (ref >60)
Glucose, Bld: 99 mg/dL (ref 70–99)
Potassium: 4 mmol/L (ref 3.5–5.1)
Sodium: 139 mmol/L (ref 135–145)

## 2019-03-26 LAB — MRSA PCR SCREENING: MRSA by PCR: NEGATIVE

## 2019-03-26 MED ORDER — CHLORHEXIDINE GLUCONATE 0.12 % MT SOLN
15.0000 mL | Freq: Two times a day (BID) | OROMUCOSAL | Status: DC
  Administered 2019-03-26 – 2019-04-01 (×8): 15 mL via OROMUCOSAL
  Filled 2019-03-26 (×11): qty 15

## 2019-03-26 MED ORDER — ORAL CARE MOUTH RINSE
15.0000 mL | Freq: Two times a day (BID) | OROMUCOSAL | Status: DC
  Administered 2019-03-26 – 2019-04-01 (×8): 15 mL via OROMUCOSAL

## 2019-03-26 MED ORDER — FUROSEMIDE 10 MG/ML IJ SOLN
40.0000 mg | Freq: Two times a day (BID) | INTRAMUSCULAR | Status: DC
  Administered 2019-03-26 – 2019-03-27 (×2): 40 mg via INTRAVENOUS
  Filled 2019-03-26 (×2): qty 4

## 2019-03-26 MED ORDER — POTASSIUM CHLORIDE 10 MEQ/100ML IV SOLN
10.0000 meq | INTRAVENOUS | Status: AC
  Administered 2019-03-26 (×4): 10 meq via INTRAVENOUS
  Filled 2019-03-26 (×2): qty 100

## 2019-03-26 MED ORDER — CHLORHEXIDINE GLUCONATE CLOTH 2 % EX PADS
6.0000 | MEDICATED_PAD | Freq: Every day | CUTANEOUS | Status: DC
  Administered 2019-03-26 – 2019-03-29 (×4): 6 via TOPICAL

## 2019-03-26 MED ORDER — PERFLUTREN LIPID MICROSPHERE
1.0000 mL | INTRAVENOUS | Status: AC | PRN
  Administered 2019-03-26: 15:00:00 3 mL via INTRAVENOUS
  Filled 2019-03-26: qty 10

## 2019-03-26 NOTE — Progress Notes (Signed)
CRITICAL VALUE ALERT  Critical Value:  PCO2 arterial 78.7  Date & Time Notied:  03/26/19 1625  Provider Notified: Carlis Abbott, MD   Orders Received/Actions taken: Bipap

## 2019-03-26 NOTE — Progress Notes (Signed)
Echocardiogram 2D Echocardiogram has been performed.  Oneal Deputy Masayuki Sakai 03/26/2019, 3:08 PM

## 2019-03-26 NOTE — Progress Notes (Signed)
PT Cancellation Note  Patient Details Name: Janice Ryan MRN: [id number] DOB: Jan 11, 1956   Cancelled Treatment:     pt transferred to step down unit due to medical.  Will attempt to see later as schedule permits.     Rica Koyanagi  PTA Acute  Rehabilitation Services Pager      [phone] Office      [phone]

## 2019-03-26 NOTE — Progress Notes (Signed)
Pt lethargic and very difficult to wake up. She will open her eyes for a few seconds and then fall back asleep. Pt refused CPAP again last night. Dr. Earnest Conroy notified and an ABG was ordered. RT called to collect. CPAP placed on patient for the time being as well as a continuous pulse ox.

## 2019-03-26 NOTE — Progress Notes (Signed)
PROGRESS NOTE    Janice Ryan  YSA:[id number]  DOB: [birth year]  PCP: Antony Blackbird, MD Admit date:03/20/2019  63 year old female with history of asthma, chronic respiratory failure on 2.5 -3 L O2 at home,HTN, generalized anxiety, morbid obesity presented with erythema and pain in bilateral lower extremities x 2 weeks. Patient had a telemetry visit with her PCP on 12/15 for symptoms of itching and burning on both of her feet refractory to amoxicillin 250 mg daily prescribed through an ED visit.Patient felt that her reported cellulitis was not improving and antibiotics were switched to Keflex which she had not picked up. ED Course: Afebrile.BP 140/62, pulse 90, RR 17,SPO2 99% on 2 L of O2-WBC 10.9, hemoglobin 10.7, platelets 259,000, sodium 139, potassium 4.6, bicarb 37, BUN 18, creatinine 0.93, serum glucose 107, calcium 11.3.COVID-19 test negative.Patient was given IV vancomycin, IV Lasix 40 mg with oral potassium, Ativan, gabapentin, morphine after which she was noted to be hypersomnolent. Hospital course: Patient admitted to [facility] for further evaluation and management. She has been treated with IV rocephin for LE redness/presumed cellulitis. IV flagyl ordered on 12/19 in concern for CT findings of colitis/pancreatitis and leucocytosis of 16k. Patient reports vomiting x 3 at home and once while here. Diet advanced to regular diet now. HC complicated by patient's hypersomnolence and noncompliance with CPAP use.  Subjective: Patient noted to be hypersomnolent this morning and ABG showed significant hypercapnia with CO2 in the 80s.  Patient now placed on NIPPV.  She was awake and talking when seen by me around 10 AM.  Objective: Vitals:   03/25/19 2038 03/26/19 0631 03/26/19 0754 03/26/19 1410  BP: (!) 150/69 (!) 147/57    Pulse: 87 86  91  Resp: 18 16    Temp: 97.6 F (36.4 C)     TempSrc: Oral     SpO2: 94% (!) 79% 92% 94%  Weight:      Height:        Intake/Output Summary (Last 24  hours) at 03/26/2019 1430 Last data filed at 03/26/2019 0313 Gross per 24 hour  Intake 240 ml  Output 1100 ml  Net -860 ml   Filed Weights   03/20/19 1500 03/25/19 0500  Weight: (!) 167.8 kg (!) 168 kg    Physical Examination:  General exam: Appears calm and comfortable  Respiratory system: Clear to auscultation. Respiratory effort normal. Cardiovascular system: S1 & S2 heard, RRR. No JVD, murmurs, rubs, gallops or clicks. No pedal edema. Gastrointestinal system: Abdomen is obese,nondistended, soft and nontender . Normal bowel sounds heard. Central nervous system: Awake alert today. No new focal neurological deficits. Extremities: No contractures, or joint deformities. Chronic stasis lymphedema but no signs of inflammation like redness/tenderness currently Skin: No rashes, lesions or ulcers other than chronic stasis lymphedema Psychiatry: Judgement and insight appear normal.  Data Reviewed: I have personally reviewed following labs and imaging studies  CBC: Recent Labs  Lab 03/20/19 1724 03/21/19 0424 03/22/19 0433 03/23/19 0438 03/24/19 0420  WBC 10.9* 11.1* 16.0* 16.7* 15.9*  NEUTROABS 7.9*  --   --   --   --   HGB 10.7* 11.1* 11.3* 11.2* 10.7*  HCT 35.9* 38.7 38.2 39.0 36.1  MCV 98.6 100.8* 97.9 98.5 98.1  PLT 259 247 249 256 323   Basic Metabolic Panel: Recent Labs  Lab 03/20/19 1724 03/21/19 0424 03/22/19 0433 03/23/19 0438 03/24/19 0420  NA 139 140 136 137  --   K 4.6 4.2 3.5 3.7  --   CL 94* 95* 90*  90*  --   CO2 37* 39* 37* 37*  --   GLUCOSE 107* 140* 123* 97  --   BUN 18 16 13 12   --   CREATININE 0.93 1.03* 0.81 0.81  --   CALCIUM 11.3* 10.7* 10.6* 10.6* 10.8*   GFR: Estimated Creatinine Clearance: 121.5 mL/min (by C-G formula based on SCr of 0.81 mg/dL). Liver Function Tests: No results for input(s): AST, ALT, ALKPHOS, BILITOT, PROT, ALBUMIN in the last 168 hours. Recent Labs  Lab 03/22/19 1109 03/23/19 0438  LIPASE 189* 108*   No results  for input(s): AMMONIA in the last 168 hours. Coagulation Profile: No results for input(s): INR, PROTIME in the last 168 hours. Cardiac Enzymes: No results for input(s): CKTOTAL, CKMB, CKMBINDEX, TROPONINI in the last 168 hours. BNP (last 3 results) No results for input(s): PROBNP in the last 8760 hours. HbA1C: No results for input(s): HGBA1C in the last 72 hours. CBG: No results for input(s): GLUCAP in the last 168 hours. Lipid Profile: No results for input(s): CHOL, HDL, LDLCALC, TRIG, CHOLHDL, LDLDIRECT in the last 72 hours. Thyroid Function Tests: No results for input(s): TSH, T4TOTAL, FREET4, T3FREE, THYROIDAB in the last 72 hours. Anemia Panel: No results for input(s): VITAMINB12, FOLATE, FERRITIN, TIBC, IRON, RETICCTPCT in the last 72 hours. Sepsis Labs: No results for input(s): PROCALCITON, LATICACIDVEN in the last 168 hours.  Recent Results (from the past 240 hour(s))  SARS CORONAVIRUS 2 (TAT 6-24 HRS) Nasopharyngeal Nasopharyngeal Swab     Status: None   Collection Time: 03/20/19  5:24 PM   Specimen: Nasopharyngeal Swab  Result Value Ref Range Status   SARS Coronavirus 2 NEGATIVE NEGATIVE Final    Comment: (NOTE) SARS-CoV-2 target nucleic acids are NOT DETECTED. The SARS-CoV-2 RNA is generally detectable in upper and lower respiratory specimens during the acute phase of infection. Negative results do not preclude SARS-CoV-2 infection, do not rule out co-infections with other pathogens, and should not be used as the sole basis for treatment or other patient management decisions. Negative results must be combined with clinical observations, patient history, and epidemiological information. The expected result is Negative. Fact Sheet for Patients: HairSlick.nohttps://www.fda.gov/media/138098/download Fact Sheet for Healthcare Providers: quierodirigir.comhttps://www.fda.gov/media/138095/download This test is not yet approved or cleared by the [place] FDA and  has been authorized for detection  and/or diagnosis of SARS-CoV-2 by FDA under an Emergency Use Authorization (EUA). This EUA will remain  in effect (meaning this test can be used) for the duration of the COVID-19 declaration under Section 56 4(b)(1) of the Act, 21 U.S.C. section 360bbb-3(b)(1), unless the authorization is terminated or revoked sooner. Performed at [facility], [address] [place], [place] [address]       Radiology Studies: DG CHEST PORT 1 VIEW  Result Date: 03/24/2019 CLINICAL DATA:  History of asthma.  Chronic respiratory failure. EXAM: PORTABLE CHEST 1 VIEW COMPARISON:  CT 03/22/2019.  Portable chest 07/27/2018. FINDINGS: Cardiomegaly with prominent central pulmonary arteries. This suggest possibility of pulmonary hypertension. Mild left mid lung field subsegmental atelectasis. No pleural effusion or pneumothorax. Reference is made to CT chest report 03/22/2019 for discussion of small anterior mediastinal lesion. IMPRESSION: 1. Cardiomegaly with prominent central pulmonary arteries. This suggest possibility of pulmonary hypertension. 2.  Mild left mid lung field subsegmental atelectasis. 3. Reference is made to CT chest report of 03/22/2019 for discussion of small anterior mediastinal lesion. Electronically Signed   By: Maisie Fushomas  Register   On: 03/24/2019 17:04  Scheduled Meds: . albuterol  2.5 mg Nebulization BID  . amoxicillin-clavulanate  1 tablet Oral Q12H  . busPIRone  5 mg Oral TID  . chlorhexidine  15 mL Mouth Rinse BID  . Chlorhexidine Gluconate Cloth  6 each Topical Daily  . DULoxetine  60 mg Oral Daily  . enoxaparin (LOVENOX) injection  80 mg Subcutaneous QHS  . fluticasone  1 spray Each Nare Daily  . furosemide  40 mg Intravenous Q12H  . lisinopril  10 mg Oral Daily  . mouth rinse  15 mL Mouth Rinse q12n4p   Continuous Infusions: . potassium chloride      Assessment & Plan:   Cellulitis vs reactive lymphangitis of both lower extremities with underlying chronic  venous stasis: -Patient had erythema of both lower extremities, up to knees, also venous stasis-Doppler ultrasound of the lower extremities negative for DVT-Much improved, started on IV Rocephin  upon admission, also received Lasix in ED. White count was 10.9 on presentation, peaked to 16.7 and now at [facility]. Afebrile with Tmax 99.6 during hospital course.Repeat CBC today pending  Acute on chronic hypoxic and hypercapnic respiratory failure: chronically on 2.5 to 3 L at home and currently at baseline.  Reports history of asthma. Patient has required 5 to 6 L O2 during hospitalization possibly related to multiple pain meds/sedatives she received in ED with underlying asthma,sleep apnea and OHS-been non compliant with CPAP while here. COVID-19 test negative- No evidence of acute pulmonary edema but patient started on Lasix for leg edema. 2D echo 07/2018 had shown EF > 65% with impaired relaxation, grade 1 diastolic CHF,D-dimer 4.59.-CT angiogram of the chest showed no PE but reported well-circumscribed solid nodule versus complex cyst in the anterior mediastinum, stable for last 8 months. Follow-up in 12 months.  CXR done 12/21 suggestive of atelectasis/pulmonary HTN, Prior echo -poor technique but suggestive of RV dysfunction.  Patient likely has obstructive sleep apnea with associated pulmonary hypertension/RV dysfunction.  Consulted pulmonary to advise regarding home CPAP/BiPAP.  Patient been noncompliant with incentive spirometry and CPAP in spite of multiple cues.  CT abnormality suggestive of pancreatic/colonic inflammation: CT chest incidentally reported new inflammatory stranding adjacent to tail of pancreas and splenic flexure suggestive of possible colitis diverticulitis or pancreatitis. Although Lipase elevated at 189, no acute abdominal complaints, nausea vomiting or diarrhea--findings could be related  To hypercalcemia or HCTZ that patient takes at home. Patient was placed on full liquids then  increased to heart healthy, tolerating well.  Lasix was held, Flagyl was added on 12/19 for possible colitis. No fever but has had white count as discussed above. .Plan on only short course of antibiotics (5 day)--changed to Augmentin which can be stopped on 12/25.   Mild hypercalcemia- Likely related to chronicThiazide use. Calcium 11.3 noted at the time of admission,currently improved to 10.6 after d/ced HCTZ. Ionized calcium slightly up at 6, PTH levels wnl.No evidence of lung malignancy on CT chest.   Mediastinal nodule:Prior CTA chest 07/28/2018 showed a 2.8 x 2.2 cm nodule in the anterior mediastinum. It was recommended to have follow-up chest CT in 3-6 months or further characterization with PET CT. Unclear follow-up and imaging was pursued. CT chest done this admission shows stable nodule size.  AMS/Hypersomnolence: Likely due to polypharmacy and OSA/CO2 narcosis as she has not been using CPAP consistently while here. Declined to increase opiate /Xanax regimen which can cause worsening explained to patient over the last few days.  Seen by pulmonary 2 days, started on BiPAP and recommended  to discontinue all sedatives..   Right knee pain-States she fell at home and had difficulty bearing weight on the right knee. X-ray of the right knee showed severe osteoarthritis,no fracture.  Continue local analgesic cream   Hypertension-Currently stable, continue lisinopril, hold diuretics  Generalized anxiety disorder:Continue BuSpar and Cymbalta.  Morbid obesity:BMI 56.2, counseled on diet and weight control   DVT prophylaxis: lovenox Code Status: Full  Family / Patient Communication: d/w paient, called husband 12/21-left a message Disposition Plan: PT recommended SNF initially but patient not very cooperative during inpatient PT and per case management does not have insurance coverage.  She will need to be discharged with home health services when medically stable.   LOS: 5 days    Time  spent: 35 minutes    Alessandra Bevels, MD [facility] Pager [phone]  If 7PM-7AM, please contact night-coverage [email or web address] Password [email or web address] 03/26/2019, 2:30 PM

## 2019-03-26 NOTE — Progress Notes (Signed)
Found patient eating cantaloupe with CPAP masked pulled to the side. Explained to patient that she is unable to eat at this time due to being difficult to arouse earlier. Tray removed.

## 2019-03-26 NOTE — Progress Notes (Signed)
Dr. Earnest Conroy made aware of RT's recommendations to place patient on bipap and transfer to stepdown.

## 2019-03-26 NOTE — Consult Note (Addendum)
NAME:  Destenie Ingber, MRN:  [id number], DOB:  10-Sep-1955, LOS: 5 ADMISSION DATE:  03/20/2019, CONSULTATION DATE: 12/23 REFERRING MD:  Lajuana Ripple, CHIEF COMPLAINT:  Altered mental status  And hypercarbia   Brief History   63 year old female admitted w/ cellulitis 12/17 w/ cellulitis. Prior concern for OSA/OHS. On Home O2. Pulm asked to see 12/23 for hypercarbia and AMS  History of present illness    63 year old female w/ MMP admitted 12/16 w/ working dx of bilateral LE cellulitis which did not respond to antibiotics (note  She did not pick up second prescription recommended).   Started on IV Rocephin. ED stay notable for being hypersomnolence after getting ativan, morphine and neurontin. She was placed on higher oxygen than baseline requiring up to 5 liters (typically on 2.5).   12/18 ECHO showed intact LV function w/ gd I diastolic dysfxn. Was receiving lasix for element of Volume overload. Ordered CPAP at HS; refused   12/19 CT chest for on-going hypoxia and elevated ddimer. Anterior mediastinal nodule not changed. No PE. Did incidentally see inflammatory stranding of pancreas and possible colitis. Lipase was elevated. Added flagyl to abx. Refused CPAP again  12/20 refusing PT initially needed mod assist standing w/ 2 people. Ambulated 7 feet w/ rolling walker. Pulse ox down to 85% on 3 liters.  Decline CPAP again  12/21 requesting narcotics for abd pain. More sleepy.  12/22 saturations mid 90s on 2 liters. Refused CPAP again  12/23 lethargic. ABG obtained: 7.31; 85; 87; 42 sats 96%   Past Medical History  Asthma (previously followed by a pulmonologist ->not able to afford advair), HTN, MO BMI 56.31 Kg/m2, chronic respiratory failure (O2 at home 2 to 2.5 liters at night. Baseline pco2 calculates to ~64) chronic pain, non-compliance, ADHD, Fibromyalgia  ->treated for exacerbation 07/2018, concern raised at that point for OSA and OHS Also incidentally found mediastinal LA on CT scan  chest   Significant Hospital Events   12/17 admitted for cellulitis of lower extremities  Started on IV Rocephin. ED stay notable for being hypersomnolence after getting ativan, morphine and neurontin. She was placed on higher oxygen than baseline requiring up to 5 liters (typically on 2.5).   12/18 ECHO showed intact LV function w/ gd I diastolic dysfxn. Was receiving lasix for element of Volume overload. Ordered CPAP at HS; refused   12/19 CT chest for on-going hypoxia and elevated ddimer. Anterior mediastinal nodule not changed. No PE. Did incidentally see inflammatory stranding of pancreas and possible colitis. Lipase was elevated. Added flagyl to abx. Refused CPAP again  12/20 refusing PT initially needed mod assist standing w/ 2 people. Ambulated 7 feet w/ rolling walker. Pulse ox down to 85% on 3 liters.  Decline CPAP again  12/21 requesting narcotics for abd pain. More sleepy.  12/22 saturations mid 90s on 2 liters. Refused CPAP again  12/23 lethargic. ABG obtained: 7.31; 85; 87; 42 sats 96%  Consults:  pulm 12/23  Procedures:    Significant Diagnostic Tests:  4/26 ECHO: . The left ventricle has hyperdynamic systolic function, with an ejection fraction of >65%. The cavity size was normal. Left ventricular diastolic Doppler parameters are consistent with impaired relaxation.  2. The right ventricle has mildly reduced systolic function. The cavity was moderately enlarged. There is no increase in right ventricular wall thickness.  3. Aortic valve regurgitation was not assessed by color flow Doppler.  4. Pulmonic valve regurgitation was not assessed by color flow Doppler.  Micro Data:  Antimicrobials:  Rocephin 12/17>>>12/21 Augmentin 12/21>>>  Interim history/subjective:  Lethargic difficult to arouse.   Objective   Blood pressure (Abnormal) 147/57, pulse 86, temperature 97.6 F (36.4 C), temperature source Oral, resp. rate 16, height  (1.727 m), weight (Abnormal)  168 kg, SpO2 92 %.        Intake/Output Summary (Last 24 hours) at 03/26/2019 1125 Last data filed at 03/26/2019 0313 Gross per 24 hour  Intake 440 ml  Output 1100 ml  Net -660 ml   Filed Weights   03/20/19 1500 03/25/19 0500  Weight: (Abnormal) 167.8 kg (Abnormal) 168 kg    Examination: General: obese female. Lying in bed. Slow to awaken requiring gentle shake HENT: MMM no obvious JVD but neck is short. CPAP mask is in place but there is audible and clear airleak Lungs: diminished t/o with mild accessory use  Cardiovascular: distant RRR Abdomen: obese + bowel sounds no clear organomegaly  Extremities: chronic venous stasis changes. LE edema pulses present  Neuro: lethargic. Speech slurred moves all extremities  GU: voids   Resolved Hospital Problem list   Mild pancreatitis Lower extremity cellulitis  Assessment & Plan:   Acute on chronic hypercarbic respiratory failure. Favor 2/2 polypharmacy superimposed on obesity hypoventilation syndrome as well as element of OSA.  -no wheezing on exam.  -her baseline co2 calculates to 62 not acutely hypoxic or requiring higher O2 than baseline but I suspect that she actually needs oxygen 24/7 and clinically she appears to have element of cor pulmonale  Plan Transfer to SDU Initiate BIPAP in place of CPAP, then repeat ABG after initiation Continue IV diuresis Discontinue all sedating medications Mobilize Incentive spirometry Will need a walking oximetry prior to discharge to determine oxygen needs at home Will also need arterial blood gas to be repeated as we get closer to baseline pulmonary status I suspect she will need home BiPAP, or even possibly home noninvasive ventilation, I think she needs to demonstrate she will be compliant with this however before this is considered If she will be compliant with therapy she will need pulmonary follow-up with a sleep boarded provider  Acute metabolic encephalopathy in the setting of  hypercarbia, also wonder about sedating medications.  She did get Xanax during nighttime shift Plan BiPAP DC all sedating medicines Supportive care Acute diastolic heart failure Plan Continuing IV diuresis BP control Telemetry monitoring Repeating echocardiogram  Reported history of chronic asthma.  Not clear to me if she actually truly has reactive airway disease or not.  More inclined to think she is chronically dyspneic which may be more body habitus related Plan Continuing supplemental oxygen Bronchodilators as needed Avoid steroids  LE cellulitis Plan Per primary team.  She is completed 7 days of antibiotics Will defer to primary service  Mild leukocytosis Plan Continue to monitor     Labs   CBC: Recent Labs  Lab 03/20/19 1724 03/21/19 0424 03/22/19 0433 03/23/19 0438 03/24/19 0420  WBC 10.9* 11.1* 16.0* 16.7* 15.9*  NEUTROABS 7.9*  --   --   --   --   HGB 10.7* 11.1* 11.3* 11.2* 10.7*  HCT 35.9* 38.7 38.2 39.0 36.1  MCV 98.6 100.8* 97.9 98.5 98.1  PLT 259 247 249 256 249    Basic Metabolic Panel: Recent Labs  Lab 03/20/19 1724 03/21/19 0424 03/22/19 0433 03/23/19 0438 03/24/19 0420  NA 139 140 136 137  --   K 4.6 4.2 3.5 3.7  --   CL 94* 95* 90* 90*  --  CO2 37* 39* 37* 37*  --   GLUCOSE 107* 140* 123* 97  --   BUN 18 16 13 12   --   CREATININE 0.93 1.03* 0.81 0.81  --   CALCIUM 11.3* 10.7* 10.6* 10.6* 10.8*   GFR: Estimated Creatinine Clearance: 121.5 mL/min (by C-G formula based on SCr of 0.81 mg/dL). Recent Labs  Lab 03/21/19 0424 03/22/19 0433 03/23/19 0438 03/24/19 0420  WBC 11.1* 16.0* 16.7* 15.9*    Liver Function Tests: No results for input(s): AST, ALT, ALKPHOS, BILITOT, PROT, ALBUMIN in the last 168 hours. Recent Labs  Lab 03/22/19 1109 03/23/19 0438  LIPASE 189* 108*   No results for input(s): AMMONIA in the last 168 hours.  ABG    Component Value Date/Time   PHART 7.316 (L) 03/26/2019 1021   PCO2ART 85.0 (HH)  03/26/2019 1021   PO2ART 86.6 03/26/2019 1021   HCO3 42.1 (H) 03/26/2019 1021   O2SAT 96.1 03/26/2019 1021     Coagulation Profile: No results for input(s): INR, PROTIME in the last 168 hours.  Cardiac Enzymes: No results for input(s): CKTOTAL, CKMB, CKMBINDEX, TROPONINI in the last 168 hours.  HbA1C: No results found for: HGBA1C  CBG: No results for input(s): GLUCAP in the last 168 hours.  Review of Systems:   Not able due to patient being encephalopathic  Past Medical History  She,  has a past medical history of Acute respiratory failure with hypoxia ([place]), Asthma, Hypertension, and Morbid obesity ([place]).   Surgical History    Past Surgical History:  Procedure Laterality Date  . HERNIA REPAIR    . JOINT REPLACEMENT       Social History   reports that she has never smoked. She has never used smokeless tobacco. She reports that she does not drink alcohol or use drugs.   Family History   Her family history includes Atrial fibrillation in her mother; Congestive Heart Failure in her father.   Allergies No Known Allergies   Home Medications  Prior to Admission medications   Medication Sig Start Date End Date Taking? Authorizing Provider  albuterol (PROVENTIL) (2.5 MG/3ML) 0.083% nebulizer solution Take 3 mLs (2.5 mg total) by nebulization every 6 (six) hours as needed for wheezing or shortness of breath. 12/05/18  Yes Fulp, Cammie, MD  ALPRAZolam (XANAX) 0.5 MG tablet Take 0.5 mg by mouth daily.   Yes [provider]  busPIRone (BUSPAR) 5 MG tablet TAKE ONE TABLET BY MOUTH THREE TIMES A DAY FOR ANXIETY 03/24/19   Fulp, Cammie, MD  Cholecalciferol (VITAMIN D3 PO) Take 1 tablet by mouth daily.   Yes [provider]  Ferrous Sulfate (IRON PO) Take 1 tablet by mouth every other day.   Yes [provider]  furosemide (LASIX) 20 MG tablet Take 1 tablet (20 mg total) by mouth daily. 11/25/18  Yes Charlott Rakes, MD  gabapentin (NEURONTIN) 300 MG capsule  Take 1 capsule (300 mg total) by mouth 3 (three) times daily. 12/17/18  Yes Lacretia Leigh, MD  lisinopril-hydrochlorothiazide (ZESTORETIC) 10-12.5 MG tablet TAKE ONE TABLET BY MOUTH DAILY Patient taking differently: Take 1 tablet by mouth daily.  01/02/19  Yes Fulp, Cammie, MD  montelukast (SINGULAIR) 10 MG tablet TAKE ONE TABLET BY MOUTH EVERY NIGHT AT BEDTIME 02/20/19  Yes Fulp, Cammie, MD  Multiple Vitamin (MULTIVITAMIN WITH MINERALS) TABS tablet Take 1 tablet by mouth daily.   Yes [provider]  albuterol (VENTOLIN HFA) 108 (90 Base) MCG/ACT inhaler Inhale 2 puffs into the lungs every  6 (six) hours as needed for wheezing or shortness of breath. 03/18/19   Hoy RegisterNewlin, Enobong, MD  cephALEXin (KEFLEX) 500 MG capsule Take 1 capsule (500 mg total) by mouth 2 (two) times daily. 03/18/19   Hoy RegisterNewlin, Enobong, MD  DULoxetine (CYMBALTA) 60 MG capsule TAKE ONE CAPSULE BY MOUTH DAILY Patient taking differently: Take 60 mg by mouth daily.  02/20/19   Fulp, Cammie, MD  Fluticasone-Salmeterol (ADVAIR) 500-50 MCG/DOSE AEPB Inhale 1 puff into the lungs 2 (two) times daily. 03/18/19 04/17/19  Hoy RegisterNewlin, Enobong, MD  methocarbamol (ROBAXIN) 500 MG tablet Take 1 tablet (500 mg total) by mouth 2 (two) times daily. 03/18/19   Hoy RegisterNewlin, Enobong, MD  OXYGEN Inhale into the lungs at bedtime as needed (2 L).    [provider]  traMADol Janean Sark(ULTRAM) 50 MG tablet One pill every 8 hours as needed for severe pain Patient not taking: Reported on 03/20/2019 01/17/19   Cain SaupeFulp, Cammie, MD     .    Simonne MartinetPeter E Babcock ACNP-BC [facility] Pulmonary/Critical Care Pager # [phone] OR # [phone] if no answer

## 2019-03-27 DIAGNOSIS — D649 Anemia, unspecified: Secondary | ICD-10-CM

## 2019-03-27 DIAGNOSIS — I5033 Acute on chronic diastolic (congestive) heart failure: Secondary | ICD-10-CM

## 2019-03-27 DIAGNOSIS — E662 Morbid (severe) obesity with alveolar hypoventilation: Secondary | ICD-10-CM

## 2019-03-27 LAB — BLOOD GAS, ARTERIAL
Acid-Base Excess: 16.9 mmol/L — ABNORMAL HIGH (ref 0.0–2.0)
Acid-Base Excess: 17.3 mmol/L — ABNORMAL HIGH (ref 0.0–2.0)
Bicarbonate: 46.1 mmol/L — ABNORMAL HIGH (ref 20.0–28.0)
Bicarbonate: 45.1 mmol/L — ABNORMAL HIGH (ref 20.0–28.0)
Drawn by: 441261
FIO2: 40
O2 Content: 3 L/min
O2 Saturation: 95.8 %
O2 Saturation: 96 %
Patient temperature: 98.2
Patient temperature: 98.6
pCO2 arterial: 89.4 mmHg (ref 32.0–48.0)
pCO2 arterial: 74.6 mmHg (ref 32.0–48.0)
pH, Arterial: 7.331 — ABNORMAL LOW (ref 7.350–7.450)
pH, Arterial: 7.399 (ref 7.350–7.450)
pO2, Arterial: 81 mmHg — ABNORMAL LOW (ref 83.0–108.0)
pO2, Arterial: 78 mmHg — ABNORMAL LOW (ref 83.0–108.0)

## 2019-03-27 LAB — IRON AND TIBC
Iron: 38 ug/dL (ref 28–170)
Saturation Ratios: 14 % (ref 10.4–31.8)
TIBC: 263 ug/dL (ref 250–450)
UIBC: 225 ug/dL

## 2019-03-27 LAB — CBC
HCT: 38.3 % (ref 36.0–46.0)
Hemoglobin: 10.9 g/dL — ABNORMAL LOW (ref 12.0–15.0)
MCH: 28.3 pg (ref 26.0–34.0)
MCHC: 28.5 g/dL — ABNORMAL LOW (ref 30.0–36.0)
MCV: 99.5 fL (ref 80.0–100.0)
Platelets: 290 10*3/uL (ref 150–400)
RBC: 3.85 MIL/uL — ABNORMAL LOW (ref 3.87–5.11)
RDW: 12.9 % (ref 11.5–15.5)
WBC: 9.4 10*3/uL (ref 4.0–10.5)
nRBC: 0 % (ref 0.0–0.2)

## 2019-03-27 LAB — FERRITIN: Ferritin: 153 ng/mL (ref 11–307)

## 2019-03-27 LAB — VITAMIN B12: Vitamin B-12: 837 pg/mL (ref 180–914)

## 2019-03-27 MED ORDER — ORAL CARE MOUTH RINSE: 15.0000 mL | Freq: Two times a day (BID) | OROMUCOSAL | Status: DC

## 2019-03-27 MED ORDER — ADULT MULTIVITAMIN W/MINERALS CH
1.0000 | ORAL_TABLET | Freq: Every day | ORAL | Status: DC
  Administered 2019-03-27 – 2019-04-02 (×7): 1 via ORAL
  Filled 2019-03-27 (×7): qty 1

## 2019-03-27 MED ORDER — HYDROXYZINE HCL 10 MG PO TABS
10.0000 mg | ORAL_TABLET | Freq: Three times a day (TID) | ORAL | Status: DC | PRN
  Administered 2019-03-27 – 2019-03-31 (×3): 10 mg via ORAL
  Filled 2019-03-27 (×4): qty 1

## 2019-03-27 MED ORDER — CHLORHEXIDINE GLUCONATE 0.12 % MT SOLN
15.0000 mL | Freq: Two times a day (BID) | OROMUCOSAL | Status: DC
  Administered 2019-03-27 – 2019-03-28 (×2): 15 mL via OROMUCOSAL
  Filled 2019-03-27: qty 15

## 2019-03-27 NOTE — TOC Progression Note (Signed)
Transition of Care [facility]) - Progression Note    Patient Details  Name: Janice Ryan MRN: [id number] Date of Birth: 11-15-1955  Transition of Care [facility] CM/SW Contact  Kripa Foskey, Marjie Skiff, RN Phone Number: 03/27/2019, 12:03 PM  Clinical Narrative:    [facility] consult for Trilogy vent. Pt has no insurance. Per [employer] supervisor Trilogy vents are not done as charity. MD paged to inform.   Expected Discharge Plan: Home/Self Care Barriers to Discharge: Continued Medical Work up  Expected Discharge Plan and Services Expected Discharge Plan: Home/Self Care   Discharge Planning Services: CM Consult   Living arrangements for the past 2 months: Single Family Home                                       Social Determinants of Health (SDOH) Interventions    Readmission Risk Interventions Readmission Risk Prevention Plan 07/31/2018  Post Dischage Appt Complete  Medication Screening Complete  Transportation Screening Complete  Some recent data might be hidden

## 2019-03-27 NOTE — Progress Notes (Signed)
Spoke with Janett Billow in Respiratory regarding continued critical PCO2 levels.  Respiratory will make adjustments to BiPAP.

## 2019-03-27 NOTE — Progress Notes (Signed)
ABG obtained on the following settings: IPAP 14 EPAP 7 .40 FiO2 BUR 10 Sample sent to lab for analysis.

## 2019-03-27 NOTE — Progress Notes (Signed)
PROGRESS NOTE    Janice Ryan  JXB:[id number]  DOB: [birth year]  PCP: Cain Saupe, MD Admit date:03/20/2019  63 year old female with history of asthma, chronic respiratory failure on 2.5 -3 L O2 at home,HTN, generalized anxiety, morbid obesity presented with erythema and pain in bilateral lower extremities x 2 weeks. Patient had a telemetry visit with her PCP on 12/15 for symptoms of itching and burning on both of her feet refractory to amoxicillin 250 mg daily prescribed through an ED visit.Patient felt that her reported cellulitis was not improving and antibiotics were switched to Keflex which she had not picked up. ED Course: Afebrile.BP 140/62, pulse 90, RR 17,SPO2 99% on 2 L of O2-WBC 10.9, hemoglobin 10.7, platelets 259,000, sodium 139, potassium 4.6, bicarb 37, BUN 18, creatinine 0.93, serum glucose 107, calcium 11.3.COVID-19 test negative.Patient was given IV vancomycin, IV Lasix 40 mg with oral potassium, Ativan, gabapentin, morphine after which she was noted to be hypersomnolent. Hospital course: Patient admitted to [facility] for further evaluation and management. She has been treated with IV rocephin for LE redness/presumed cellulitis. IV flagyl ordered on 12/19 in concern for CT findings of colitis/pancreatitis and leucocytosis of 16k. Patient reports vomiting x 3 at home and once while here. Diet advanced to regular diet now. HC complicated by patient's hypersomnolence and noncompliance with CPAP use.  Subjective: Patient now in stepdown bed with  BIPAP for CO2 narcosis, settings adjusted overnight per CO2 levels and Pulmonary following. Was briefly taken off BIPAP, placed on high flow 3 to 4 L O2 and appeared awake, alert oriented x3 when seen by me in rounds this morning. She continues to ask for pain medications including tramadol.  Seen by pulmonary this morning and recommended to avoid opiates/benzodiazepines.  Objective: Vitals:   03/27/19 0806 03/27/19 0839 03/27/19 0855  03/27/19 1405  BP:      Pulse:  98 96 95  Resp:  Temp:      TempSrc:      SpO2: 95% 91% 95% 93%  Weight:      Height:        Intake/Output Summary (Last 24 hours) at 03/27/2019 1425 Last data filed at 03/27/2019 0541 Gross per 24 hour  Intake 287.77 ml  Output 2300 ml  Net -2012.23 ml   Filed Weights   03/20/19 1500 03/25/19 0500 03/27/19 0500  Weight: (!) 167.8 kg (!) 168 kg (!) 225 kg    Physical Examination:  General exam: Appears calm and comfortable  Respiratory system: Clear to auscultation. Respiratory effort normal. Cardiovascular system: S1 & S2 heard, RRR. No JVD, murmurs, rubs, gallops or clicks. No pedal edema. Gastrointestinal system: Abdomen is obese,nondistended, soft and nontender . Normal bowel sounds heard. Central nervous system: Awake alert today. No new focal neurological deficits. Extremities: No contractures, or joint deformities. Chronic stasis lymphedema but no signs of inflammation like redness/tenderness currently Skin: No rashes, lesions or ulcers other than chronic stasis lymphedema Psychiatry: Judgement and insight appear normal.  Data Reviewed: I have personally reviewed following labs and imaging studies  CBC: Recent Labs  Lab 03/20/19 1724 03/22/19 0433 03/23/19 0438 03/24/19 0420 03/26/19 1520 03/27/19 0146  WBC 10.9* 16.0* 16.7* 15.9* 10.0 9.4  NEUTROABS 7.9*  --   --   --   --   --   HGB 10.7* 11.3* 11.2* 10.7* 11.0* 10.9*  HCT 35.9* 38.2 39.0 36.1 37.3 38.3  MCV 98.6 97.9 98.5 98.1 98.4 99.5  PLT 259 249 256 249 288 290  Basic Metabolic Panel: Recent Labs  Lab 03/20/19 1724 03/21/19 0424 03/22/19 0433 03/23/19 0438 03/24/19 0420 03/26/19 1520  NA 139 140 136 137  --  139  K 4.6 4.2 3.5 3.7  --  4.0  CL 94* 95* 90* 90*  --  89*  CO2 37* 39* 37* 37*  --  42*  GLUCOSE 107* 140* 123* 97  --  99  BUN 18 16 13 12   --  13  CREATININE 0.93 1.03* 0.81 0.81  --  0.70  CALCIUM 11.3* 10.7* 10.6* 10.6* 10.8* 10.6*     GFR: Estimated Creatinine Clearance: 149.6 mL/min (by C-G formula based on SCr of 0.7 mg/dL). Liver Function Tests: No results for input(s): AST, ALT, ALKPHOS, BILITOT, PROT, ALBUMIN in the last 168 hours. Recent Labs  Lab 03/22/19 1109 03/23/19 0438  LIPASE 189* 108*   No results for input(s): AMMONIA in the last 168 hours. Coagulation Profile: No results for input(s): INR, PROTIME in the last 168 hours. Cardiac Enzymes: No results for input(s): CKTOTAL, CKMB, CKMBINDEX, TROPONINI in the last 168 hours. BNP (last 3 results) No results for input(s): PROBNP in the last 8760 hours. HbA1C: No results for input(s): HGBA1C in the last 72 hours. CBG: No results for input(s): GLUCAP in the last 168 hours. Lipid Profile: No results for input(s): CHOL, HDL, LDLCALC, TRIG, CHOLHDL, LDLDIRECT in the last 72 hours. Thyroid Function Tests: No results for input(s): TSH, T4TOTAL, FREET4, T3FREE, THYROIDAB in the last 72 hours. Anemia Panel: Recent Labs    03/27/19 1214  VITAMINB12 837  FERRITIN 153  TIBC 263  IRON 38   Sepsis Labs: No results for input(s): PROCALCITON, LATICACIDVEN in the last 168 hours.  Recent Results (from the past 240 hour(s))  SARS CORONAVIRUS 2 (TAT 6-24 HRS) Nasopharyngeal Nasopharyngeal Swab     Status: None   Collection Time: 03/20/19  5:24 PM   Specimen: Nasopharyngeal Swab  Result Value Ref Range Status   SARS Coronavirus 2 NEGATIVE NEGATIVE Final    Comment: (NOTE) SARS-CoV-2 target nucleic acids are NOT DETECTED. The SARS-CoV-2 RNA is generally detectable in upper and lower respiratory specimens during the acute phase of infection. Negative results do not preclude SARS-CoV-2 infection, do not rule out co-infections with other pathogens, and should not be used as the sole basis for treatment or other patient management decisions. Negative results must be combined with clinical observations, patient history, and epidemiological information. The  expected result is Negative. Fact Sheet for Patients: 03/22/19 Fact Sheet for Healthcare Providers: HairSlick.no This test is not yet approved or cleared by the quierodirigir.com FDA and  has been authorized for detection and/or diagnosis of SARS-CoV-2 by FDA under an Emergency Use Authorization (EUA). This EUA will remain  in effect (meaning this test can be used) for the duration of the COVID-19 declaration under Section 56 4(b)(1) of the Act, 21 U.S.C. section 360bbb-3(b)(1), unless the authorization is terminated or revoked sooner. Performed at [facility], [address], [place], [place]   MRSA PCR Screening     Status: None   Collection Time: 03/26/19  2:11 PM   Specimen: Nasal Mucosa; Nasopharyngeal  Result Value Ref Range Status   MRSA by PCR NEGATIVE NEGATIVE Final    Comment:        The GeneXpert MRSA Assay (FDA approved for NASAL specimens only), is one component of a comprehensive MRSA colonization surveillance program. It is not intended to diagnose MRSA infection nor to guide or  monitor treatment for MRSA infections. Performed at [facility], [address] [place], [place] [address]       Radiology Studies: ECHOCARDIOGRAM COMPLETE  Result Date: 03/26/2019   ECHOCARDIOGRAM REPORT   Patient Name:   Janice Ryan Date of Exam: 03/26/2019 Medical Rec #:  [id number]             Height:       68.0 in Accession #:    [id number]            Weight:       370.4 lb Date of Birth:  05/24/1955              BSA:          2.66 m Patient Age:    63 years              BP:           170/98 mmHg Patient Gender: F                     HR:           87 bpm. Exam Location:  Inpatient Procedure: 2D Echo, Color Doppler, Cardiac Doppler and Intracardiac            Opacification Agent Indications:    R06.9 DOE  History:        Patient has prior history of Echocardiogram examinations,  most                 recent 07/28/2018. Risk Factors:Hypertension.  Sonographer:    Irving BurtonEmily Senior RDCS Referring Phys: [id number] PETER E BABCOCK  Sonographer Comments: Technically difficult study due to poor echo windows and patient is morbidly obese. Scanned supine on BiPap. IMPRESSIONS  1. Left ventricular ejection fraction, by visual estimation, is >75%. The left ventricle has hyperdynamic function.  2. Left ventricular diastolic parameters are consistent with Grade I diastolic dysfunction (impaired relaxation).  3. The left ventricle has no regional wall motion abnormalities.  4. Global right ventricle has normal systolic function.The right ventricular size is normal. Right vetricular wall thickness was not assessed.  5. Left atrial size was normal.  6. Right atrial size was normal.  7. The mitral valve was not well visualized.  8. The tricuspid valve is not well visualized.  9. The aortic valve was not well visualized. 10. The pulmonic valve was not well visualized. 11. The aortic root was not well visualized. 12. The inferior vena cava is dilated in size with >50% respiratory variability, suggesting right atrial pressure of 8 mmHg. 13. The interatrial septum was not well visualized. 14. Extremely limited; definity used; hyperdynamic LV function; grade 1 diastolic dysfunction; no pericardial effusion; no other useful information can be obtained from this study. FINDINGS  Left Ventricle: Left ventricular ejection fraction, by visual estimation, is >75%. The left ventricle has hyperdynamic function. The left ventricle has no regional wall motion abnormalities. There is no left ventricular hypertrophy. Left ventricular diastolic parameters are consistent with Grade I diastolic dysfunction (impaired relaxation). Right Ventricle: The right ventricular size is normal.Global RV systolic function is has normal systolic function. Left Atrium: Left atrial size was normal in size. Right Atrium: Right atrial size was normal in  size Pericardium: There is no evidence of pericardial effusion. Mitral Valve: The mitral valve was not well visualized. Tricuspid Valve: The tricuspid valve is not well visualized. Aortic Valve: The aortic valve was not well visualized. Pulmonic Valve: The pulmonic valve  was not well visualized. Aorta: The aortic root was not well visualized. Venous: The inferior vena cava is dilated in size with greater than 50% respiratory variability, suggesting right atrial pressure of 8 mmHg. IAS/Shunts: The interatrial septum was not well visualized. Additional Comments: Extremely limited; definity used; hyperdynamic LV function; grade 1 diastolic dysfunction; no pericardial effusion; no other useful information can be obtained from this study.   Diastology LV e' lateral:   8.92 cm/s LV E/e' lateral: 9.5 LV e' medial:    6.74 cm/s LV E/e' medial:  12.6  LEFT ATRIUM             Index LA Vol (A2C):   65.9 ml 24.82 ml/m LA Vol (A4C):   58.6 ml 22.07 ml/m LA Biplane Vol: 63.4 ml 23.87 ml/m  AORTIC VALVE LVOT Vmax:   84.20 cm/s LVOT Vmean:  60.600 cm/s LVOT VTI:    0.150 m MITRAL VALVE MV Area (PHT): 2.56 cm              SHUNTS MV PHT:        85.84 msec            Systemic VTI: 0.15 m MV Decel Time: 296 msec MV E velocity: 84.80 cm/s  103 cm/s MV A velocity: 102.00 cm/s 70.3 cm/s MV E/A ratio:  0.83        1.5  Kirk Ruths MD Electronically signed by Kirk Ruths MD Signature Date/Time: 03/26/2019/3:44:37 PM    Final         Scheduled Meds: . albuterol  2.5 mg Nebulization BID  . amoxicillin-clavulanate  1 tablet Oral Q12H  . busPIRone  5 mg Oral TID  . chlorhexidine  15 mL Mouth Rinse BID  . chlorhexidine  15 mL Mouth Rinse BID  . Chlorhexidine Gluconate Cloth  6 each Topical Daily  . DULoxetine  60 mg Oral Daily  . enoxaparin (LOVENOX) injection  80 mg Subcutaneous QHS  . fluticasone  1 spray Each Nare Daily  . lisinopril  10 mg Oral Daily  . mouth rinse  15 mL Mouth Rinse q12n4p  . mouth rinse  15 mL  Mouth Rinse q12n4p  . multivitamin with minerals  1 tablet Oral Daily   Continuous Infusions:   Assessment & Plan:    Acute on chronic hypoxic and hypercapnic respiratory failure: chronically on 2.5 to 3 L at home. Reports history of asthma but unclear basis of this diagnosis. Patient has required 5 to 6 L O2 during hospitalization possibly related to multiple pain meds/sedatives she received in ED with underlying asthma,sleep apnea and OHS-been non compliant with CPAP while here-very hypersomnolent on 12/23 prompting stat ABG-showed significant CO2 retention-pulmonary consulted and transferred to stepdown unit with BiPAP. CXR done 12/21 suggestive of atelectasis/pulmonary HTN, Prior echo -poor technique but suggestive of RV dysfunction.  Patient likely has obstructive sleep apnea with associated pulmonary hypertension/RV dysfunction.  Pulmonary recommends BiPAP in the long-term and trilogy to be set up upon discharge.  Social work consulted but patient may have insurance barriers.  Sleep study referral placed by pulmonary.  Patient is requiring 4 L high flow O2 when not on BiPAP to maintain O2 sats at target 88 to 92%. Work-up last week: COVID-19 test negative- No evidence of acute pulmonary edema but patient  received Lasix for leg edema in initial hospital course. 2D echo 07/2018 had shown EF > 65% with impaired relaxation, grade 1 diastolic CHF,D-dimer 1.61.-WR angiogram of the chest showed no PE.  Cellulitis  vs reactive lymphangitis of both lower extremities with underlying chronic venous stasis: -Patient had erythema of both lower extremities, up to knees, also venous stasis-Doppler ultrasound of the lower extremities negative for DVT-Much improved, started on IV Rocephin  upon admission, also received Lasix in ED. White count was 10.9 on presentation, peaked to 16.7 and now resolved. Afebrile   CT abnormality suggestive of pancreatic/colonic inflammation: CT chest incidentally reported new  inflammatory stranding adjacent to tail of pancreas and splenic flexure suggestive of possible colitis diverticulitis or pancreatitis. Although Lipase elevated at 189, no acute abdominal complaints, nausea vomiting or diarrhea--findings could be related  To hypercalcemia or HCTZ that patient takes at home. Patient was placed on full liquids then increased to heart healthy, tolerating well.  Lasix was held, Flagyl was added on 12/19 for possible colitis. No fever but has had white count as discussed above. .Plan on only short course of antibiotics (5 day)--changed to Augmentin which can be stopped on 12/25.   Mild hypercalcemia- Likely related to chronicThiazide use. Calcium 11.3 noted at the time of admission,currently improved to 10.6 after d/ced HCTZ. Ionized calcium slightly up at 6, PTH levels wnl.No evidence of lung malignancy on CT chest.   Mediastinal nodule/cyst::Prior CTA chest 07/28/2018 showed a 2.8 x 2.2 cm nodule in the anterior mediastinum. It was recommended to have follow-up chest CT in 3-6 months or further characterization with PET CT. Unclear follow-up and imaging was pursued. CT chest done this admission shows well-circumscribed solid nodule versus complex cyst in the anterior mediastinum, stable for last 8 months. Follow-up in 12 months recommended..  AMS/Hypersomnolence: Metabolic encephalopathy in the setting of OSA/CO2 narcosis exacerbated by benzo/opiate use.  Pulmonary recommends avoiding Xanax and opiates-they have discontinued these meds.  Added hydroxyzine for as needed use for anxiety.   Right knee pain-States she fell at home and had difficulty bearing weight on the right knee. X-ray of the right knee showed severe osteoarthritis,no fracture.  Continue local Voltaren gel and avoid opiates.  Patient was requesting tramadol this morning--discussed at length with her and husband regarding pulmonary recommendations and risk of worsening mental  status.  Hypertension-Currently stable, continue lisinopril, hold diuretics  Generalized anxiety disorder:Continue BuSpar and Cymbalta.  Morbid obesity:BMI 56.2, counseled on diet and weight control   DVT prophylaxis: lovenox Code Status: Full  Family / Patient Communication: d/w paient at bedside and with husband over the phone.  Husband states he has sleep apnea and uses CPAP at home.  He reports noting apneic episodes and patient and apparently had advised her to seek medical attention which she had been neglecting.  He understands the implications of taking sedatives/narcotics given ongoing concerns.  He is agreeable with the current plan.  Also informed husband regarding patient's unwillingness to work with PT --he stated he will discuss with patient and encouraged to participate when she is medically ready. Disposition Plan: PT recommended SNF initially but patient not very cooperative during inpatient PT and per case management does not have insurance coverage.  She will need to be discharged with home health services when medically stable.   LOS: 6 days    Time spent: 35 minutes    Alessandra Bevels, MD [facility] Pager [phone]  If 7PM-7AM, please contact night-coverage [email or web address] Password [email or web address] 03/27/2019, 2:25 PM

## 2019-03-27 NOTE — Progress Notes (Addendum)
CRITICAL VALUE ALERT  Critical Value:  PCO2 89.4  Date & Time Notied:  03/27/19 0335  Provider Notified: [facility]  Orders Received/Actions taken: bipap adjustments

## 2019-03-27 NOTE — Progress Notes (Addendum)
NAME:  Janice Ryan, MRN:  [id number], DOB:  10/08/1955, LOS: 6 ADMISSION DATE:  03/20/2019, CONSULTATION DATE: 12/23 REFERRING MD:  Lajuana Ripple, CHIEF COMPLAINT:  Altered mental status  And hypercarbia   Brief History   63 year old female admitted w/ cellulitis 12/17 w/ cellulitis. Prior concern for OSA/OHS. On Home O2. Pulm asked to see 12/23 for hypercarbia and AMS  History of present illness    63 year old female w/ MMP admitted 12/16 w/ working dx of bilateral LE cellulitis which did not respond to antibiotics (note  She did not pick up second prescription recommended).   Started on IV Rocephin. ED stay notable for being hypersomnolence after getting ativan, morphine and neurontin. She was placed on higher oxygen than baseline requiring up to 5 liters (typically on 2.5).   12/18 ECHO showed intact LV function w/ gd I diastolic dysfxn. Was receiving lasix for element of Volume overload. Ordered CPAP at HS; refused   12/19 CT chest for on-going hypoxia and elevated ddimer. Anterior mediastinal nodule not changed. No PE. Did incidentally see inflammatory stranding of pancreas and possible colitis. Lipase was elevated. Added flagyl to abx. Refused CPAP again  12/20 refusing PT initially needed mod assist standing w/ 2 people. Ambulated 7 feet w/ rolling walker. Pulse ox down to 85% on 3 liters.  Decline CPAP again  12/21 requesting narcotics for abd pain. More sleepy.  12/22 saturations mid 90s on 2 liters. Refused CPAP again  12/23 lethargic. ABG obtained: 7.31; 85; 87; 42 sats 96%   Past Medical History  Asthma (previously followed by a pulmonologist ->not able to afford advair), HTN, MO BMI 56.31 Kg/m2, chronic respiratory failure (O2 at home 2 to 2.5 liters at night. Baseline pco2 calculates to ~64) chronic pain, non-compliance, ADHD, Fibromyalgia  ->treated for exacerbation 07/2018, concern raised at that point for OSA and OHS Also incidentally found mediastinal LA on CT scan  chest   Significant Hospital Events   12/17 admitted for cellulitis of lower extremities  Started on IV Rocephin. ED stay notable for being hypersomnolence after getting ativan, morphine and neurontin. She was placed on higher oxygen than baseline requiring up to 5 liters (typically on 2.5).   12/18 ECHO showed intact LV function w/ gd I diastolic dysfxn. Was receiving lasix for element of Volume overload. Ordered CPAP at HS; refused   12/19 CT chest for on-going hypoxia and elevated ddimer. Anterior mediastinal nodule not changed. No PE. Did incidentally see inflammatory stranding of pancreas and possible colitis. Lipase was elevated. Added flagyl to abx. Refused CPAP again  12/20 refusing PT initially needed mod assist standing w/ 2 people. Ambulated 7 feet w/ rolling walker. Pulse ox down to 85% on 3 liters.  Decline CPAP again  12/21 requesting narcotics for abd pain. More sleepy.  12/22 saturations mid 90s on 2 liters. Refused CPAP again  12/23 lethargic. ABG obtained: 7.31; 85; 87; 42 sats 96%  Consults:  pulm 12/23  Procedures:    Significant Diagnostic Tests:  4/26 ECHO: . The left ventricle has hyperdynamic systolic function, with an ejection fraction of >65%. The cavity size was normal. Left ventricular diastolic Doppler parameters are consistent with impaired relaxation.  2. The right ventricle has mildly reduced systolic function. The cavity was moderately enlarged. There is no increase in right ventricular wall thickness.  3. Aortic valve regurgitation was not assessed by color flow Doppler.  4. Pulmonic valve regurgitation was not assessed by color flow Doppler.  Micro Data:  Antimicrobials:  Rocephin 12/17>>>12/21 Augmentin 12/21>>>  Interim history/subjective:  Slept with BiPAP overnight, settings increased to 18/8.  Breathing above the set rate.  This morning she feels more awake and alert.  She has no complaints using a BiPAP.  Objective   Blood pressure (!)  138/56, pulse 98, temperature 98.7 F (37.1 C), temperature source Axillary, resp. rate 19, height 5\' 8"  (1.727 m), weight (!) 225 kg, SpO2 91 %.    FiO2 (%):  [40 %] 40 %   Intake/Output Summary (Last 24 hours) at 03/27/2019 0847 Last data filed at 03/27/2019 0541 Gross per 24 hour  Intake 287.77 ml  Output 2300 ml  Net -2012.23 ml   Filed Weights   03/20/19 1500 03/25/19 0500 03/27/19 0500  Weight: (!) 167.8 kg (!) 168 kg (!) 225 kg    Examination: General: Chronically ill-appearing woman lying in bed in no acute distress, much more alert today HENT: [place]/AT, eyes anicteric.  No significant mask leak on BiPAP. Lungs: Distant lung sounds, clear to auscultation bilaterally.  Speaking in full sentences, mild tachypnea, but no accessory muscle use Cardiovascular: Regular rate and rhythm, no murmurs.  Unable to evaluate for JVD. Abdomen: Obese, soft, nontender Extremities: Chronic venous stasis changes, no significant edema.  No clubbing or cyanosis. Neuro: Awake and alert, answering questions appropriately.  Eyes are open, looking around the room and tracking.  Moving all extremities spontaneously.  Resolved Hospital Problem list   Mild pancreatitis Lower extremity cellulitis Acute metabolic encephalopathy due to hypercapnia leukocytosis  Assessment & Plan:   Acute on chronic hypoxic and hypercarbic respiratory failure. Favor 2/2 polypharmacy superimposed on obesity hypoventilation syndrome and OSA.  -Educated her on the importance of managing her chronic hypercapnia.  She would benefit from being discharged home on noninvasive positive pressure ventilation.  A trilogy ventilator with settings IPAP 18, EPAP 8 cm would be appropriate and should be obtainable with her chronic hypercapnia and acute respiratory failure.  She should use this anytime she is sleeping, whether during the day or at night.  Compliance with this therapy will be critical. -Weight loss should be a long-term  goal -We discussed the importance of avoiding potentially respiratory suppressing medications.  She brought up that she is not sure that Xanax and Ambien are appropriate medications for her, and I agree.  Her SNRI should not have respiratory suppressing effects.  All opiates should be avoided due to potentially respiratory suppressing effects. -Supplemental oxygen as required to maintain SPO2 88 to 92%.  Please avoid hyper hypoxia as this often worsens hypercapnia. -Out of bed mobility -And follow-up with sleep medicine after discharge-appointment has been requested  Acute on chronic diastolic heart failure-echocardiogram suggested she is now euvolemic  -Optimize hypertension control -Continue telemetry  Reported history of chronic asthma.  Not clear to me if she actually truly has reactive airway disease or not.  More inclined to think she is chronically dyspneic.  Episodes of hospitalization with wheezing may have been related to acute heart failure causing cardiac wheezing. -Continue supplemental oxygen as required to maintain SPO2 88 to 92% -Bronchodilators as needed -Recommend avoiding steroids -Okay to continue her PTA inhaler, but as an outpatient these may be able to be discontinued.  This should be reassessed when she is stable.  LE cellulitis-appears resolved after 6 days of broad-spectrum antibiotics -Likely safe to discontinue antibiotics  Chronic microcytic anemia -Multivitamin -Iron studies, B12 level.  Can check methylmalonic acid level, but these take several days to come back.  Hypercalcemia -Checking PTH level    Ms. Lin GivensJeffries- Jerilee FieldBoykin is likely stable to return to a telemetry unit. We will sign off. Please call with questions.  Labs   CBC: Recent Labs  Lab 03/20/19 1724 03/22/19 0433 03/23/19 0438 03/24/19 0420 03/26/19 1520 03/27/19 0146  WBC 10.9* 16.0* 16.7* 15.9* 10.0 9.4  NEUTROABS 7.9*  --   --   --   --   --   HGB 10.7* 11.3* 11.2* 10.7* 11.0* 10.9*   HCT 35.9* 38.2 39.0 36.1 37.3 38.3  MCV 98.6 97.9 98.5 98.1 98.4 99.5  PLT 259 249 256 249 288 290    Basic Metabolic Panel: Recent Labs  Lab 03/20/19 1724 03/21/19 0424 03/22/19 0433 03/23/19 0438 03/24/19 0420 03/26/19 1520  NA 139 140 136 137  --  139  K 4.6 4.2 3.5 3.7  --  4.0  CL 94* 95* 90* 90*  --  89*  CO2 37* 39* 37* 37*  --  42*  GLUCOSE 107* 140* 123* 97  --  99  BUN 18 16 13 12   --  13  CREATININE 0.93 1.03* 0.81 0.81  --  0.70  CALCIUM 11.3* 10.7* 10.6* 10.6* 10.8* 10.6*   GFR: Estimated Creatinine Clearance: 149.6 mL/min (by C-G formula based on SCr of 0.7 mg/dL). Recent Labs  Lab 03/23/19 0438 03/24/19 0420 03/26/19 1520 03/27/19 0146  WBC 16.7* 15.9* 10.0 9.4    Liver Function Tests: No results for input(s): AST, ALT, ALKPHOS, BILITOT, PROT, ALBUMIN in the last 168 hours. Recent Labs  Lab 03/22/19 1109 03/23/19 0438  LIPASE 189* 108*   No results for input(s): AMMONIA in the last 168 hours.  ABG    Component Value Date/Time   PHART 7.399 03/27/2019 0816   PCO2ART 74.6 (HH) 03/27/2019 0816   PO2ART 78.0 (L) 03/27/2019 0816   HCO3 45.1 (H) 03/27/2019 0816   O2SAT 96.0 03/27/2019 0816     Coagulation Profile: No results for input(s): INR, PROTIME in the last 168 hours.  Cardiac Enzymes: No results for input(s): CKTOTAL, CKMB, CKMBINDEX, TROPONINI in the last 168 hours.  HbA1C: No results found for: HGBA1C  CBG: No results for input(s): GLUCAP in the last 168 hours.   Steffanie DunnLaura P Jorrell Kuster, DO 03/27/19 9:17 AM [facility]

## 2019-03-27 NOTE — Progress Notes (Signed)
BiPAP changes made to facilitate CO2 elimination (see flowsheet). Patient is currently responsive but remains sleepy and is tolerating the changes well. Order placed for repeat ABG.

## 2019-03-28 LAB — PTH, INTACT AND CALCIUM
Calcium, Total (PTH): 10.8 mg/dL — ABNORMAL HIGH (ref 8.7–10.3)
PTH: 20 pg/mL (ref 15–65)

## 2019-03-28 MED ORDER — FUROSEMIDE 20 MG PO TABS
20.0000 mg | ORAL_TABLET | Freq: Every day | ORAL | Status: DC
  Administered 2019-03-28 – 2019-04-02 (×6): 20 mg via ORAL
  Filled 2019-03-28 (×6): qty 1

## 2019-03-28 MED ORDER — LISINOPRIL 20 MG PO TABS
20.0000 mg | ORAL_TABLET | Freq: Every day | ORAL | Status: DC
  Administered 2019-03-28 – 2019-04-02 (×6): 20 mg via ORAL
  Filled 2019-03-28 (×3): qty 1
  Filled 2019-03-28: qty 2
  Filled 2019-03-28 (×2): qty 1

## 2019-03-28 NOTE — Progress Notes (Signed)
Patient arrived to unit MASD sacrum buttocks abd folds.

## 2019-03-28 NOTE — Progress Notes (Signed)
Patient to transfer to 4E 1407 report given to receiving nurse Amber, all questions answered at this time.  Pt. VSS with no s/s of distress noted.  Pt. Stable at transfer.

## 2019-03-28 NOTE — Progress Notes (Signed)
PROGRESS NOTE    Janice Ryan  ZOX:[id number]  DOB: 06/12/1955  PCP: Cain SaupeFulp, Cammie, MD Admit date:03/20/2019  63 year old female with history of asthma, chronic respiratory failure on 2.5 -3 L O2 at home,HTN, generalized anxiety, morbid obesity presented with erythema and pain in bilateral lower extremities x 2 weeks. Patient had a telemetry visit with her PCP on 12/15 for symptoms of itching and burning on both of her feet refractory to amoxicillin 250 mg daily prescribed through an ED visit.Patient felt that her reported cellulitis was not improving and antibiotics were switched to Keflex which she had not picked up. ED Course: Afebrile.BP 140/62, pulse 90, RR 17,SPO2 99% on 2 L of O2-WBC 10.9, hemoglobin 10.7, platelets 259,000, sodium 139, potassium 4.6, bicarb 37, BUN 18, creatinine 0.93, serum glucose 107, calcium 11.3.COVID-19 test negative.Patient was given IV vancomycin, IV Lasix 40 mg with oral potassium, Ativan, gabapentin, morphine after which she was noted to be hypersomnolent. Hospital course: [facility] and management. She has been treated with IV rocephin for LE redness/presumed cellulitis. IV flagyl ordered on 12/19 in concern for CT findings of colitis/pancreatitis and leucocytosis of 16k. Patient reports vomiting x 3 at home and once while here. Diet advanced to regular diet now. HC complicated by patient's hypersomnolence and noncompliance with CPAP use.Patient's condition worsened on 12/23, pulmonary consulted for significant CO2 retention/narcosis-transferred to stepdown bed with  BIPAP, settings adjusted per CO2 levels, all sedatives discontinued and Pulmonary  recommends outpatient trilogy to be set up.  Social worker aware  Subjective: Patient now on  4 L O2 via nasal cannula, she is much more awake alert and oriented x3.  She claims she had asthma many years back for which she was issued home O2 and does not really have sleep apnea.   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  Objective: Vitals:   03/28/19 0200 03/28/19 0300 03/28/19 0400 03/28/19 0410  BP: (!) 136/47 (!) 159/96 (!) 162/64   Pulse: 82 87 92   Resp: 17 15 (!) 21   Temp:  97.8 F (36.6 C)    TempSrc:  Axillary    SpO2: 96% 98% 95%   Weight:    (!) 231 kg  Height:        Intake/Output Summary (Last 24 hours) at 03/28/2019 [id number] Last data filed at 03/28/2019 0400 Gross per 24 hour  Intake 480 ml  Output 2100 ml  Net -1620 ml   Filed Weights   03/25/19 0500 03/27/19 0500 03/28/19 0410  Weight: (!) 168 kg (!) 225 kg (!) 231 kg    Physical Examination:  General exam: Sleeping comfortably, on 4 L nasal cannula, no respiratory distress Respiratory system: Obese chest wall, clear to auscultation. Respiratory effort normal. Cardiovascular system: S1 & S2 heard, RRR.No significant pedal edema. Gastrointestinal system: Abdomen is obese,nondistended, soft and nontender . Normal bowel sounds heard. Central nervous system: Awake alert today. No new focal neurological deficits. Extremities: No contractures, or joint deformities. Chronic stasis lymphedema but no signs of inflammation like redness/tenderness currently Skin: No rashes, lesions or ulcers other than chronic stasis lymphedema Psychiatry: Judgement and insight appear normal.  Data Reviewed: I have personally reviewed following labs and imaging studies  CBC: Recent Labs  Lab 03/22/19 0433 03/23/19 0438 03/24/19 0420 03/26/19 1520 03/27/19 0146  WBC 16.0* 16.7* 15.9* 10.0 9.4  HGB 11.3* 11.2* 10.7* 11.0* 10.9*  HCT 38.2 39.0 36.1 37.3 38.3  MCV 97.9 98.5  98.1 98.4 99.5  PLT 249 256 249 288 290   Basic Metabolic Panel: Recent Labs  Lab 03/22/19 0433 03/23/19 0438 03/24/19 0420 03/26/19 1520  NA 136 137  --  139  K 3.5 3.7  --  4.0  CL 90* 90*  --  89*  CO2 37* 37*  --  42*  GLUCOSE 123* 97  --  99  BUN 13 12  --  13  CREATININE 0.81 0.81  --  0.70    CALCIUM 10.6* 10.6* 10.8* 10.6*   GFR: Estimated Creatinine Clearance: 152.4 mL/min (by C-G formula based on SCr of 0.7 mg/dL). Liver Function Tests: No results for input(s): AST, ALT, ALKPHOS, BILITOT, PROT, ALBUMIN in the last 168 hours. Recent Labs  Lab 03/22/19 1109 03/23/19 0438  LIPASE 189* 108*   No results for input(s): AMMONIA in the last 168 hours. Coagulation Profile: No results for input(s): INR, PROTIME in the last 168 hours. Cardiac Enzymes: No results for input(s): CKTOTAL, CKMB, CKMBINDEX, TROPONINI in the last 168 hours. BNP (last 3 results) No results for input(s): PROBNP in the last 8760 hours. HbA1C: No results for input(s): HGBA1C in the last 72 hours. CBG: No results for input(s): GLUCAP in the last 168 hours. Lipid Profile: No results for input(s): CHOL, HDL, LDLCALC, TRIG, CHOLHDL, LDLDIRECT in the last 72 hours. Thyroid Function Tests: No results for input(s): TSH, T4TOTAL, FREET4, T3FREE, THYROIDAB in the last 72 hours. Anemia Panel: Recent Labs    03/27/19 1214  VITAMINB12 837  FERRITIN 153  TIBC 263  IRON 38   Sepsis Labs: No results for input(s): PROCALCITON, LATICACIDVEN in the last 168 hours.  Recent Results (from the past 240 hour(s))  SARS CORONAVIRUS 2 (TAT 6-24 HRS) Nasopharyngeal Nasopharyngeal Swab     Status: None   Collection Time: 03/20/19  5:24 PM   Specimen: Nasopharyngeal Swab  Result Value Ref Range Status   SARS Coronavirus 2 NEGATIVE NEGATIVE Final    Comment: (NOTE) SARS-CoV-2 target nucleic acids are NOT DETECTED. The SARS-CoV-2 RNA is generally detectable in upper and lower respiratory specimens during the acute phase of infection. Negative results do not preclude SARS-CoV-2 infection, do not rule out co-infections with other pathogens, and should not be used as the sole basis for treatment or other patient management decisions. Negative results must be combined with clinical observations, patient history, and  epidemiological information. The expected result is Negative. Fact Sheet for Patients: HairSlick.no Fact Sheet for Healthcare Providers: quierodirigir.com This test is not yet approved or cleared by the [place] FDA and  has been authorized for detection and/or diagnosis of SARS-CoV-2 by FDA under an Emergency Use Authorization (EUA). This EUA will remain  in effect (meaning this test can be used) for the duration of the COVID-19 declaration under Section 56 4(b)(1) of the Act, 21 U.S.C. section 360bbb-3(b)(1), unless the authorization is terminated or revoked sooner. Performed at [facility], [address], [place], [place] [address]   MRSA PCR Screening     Status: None   Collection Time: 03/26/19  2:11 PM   Specimen: Nasal Mucosa; Nasopharyngeal  Result Value Ref Range Status   MRSA by PCR NEGATIVE NEGATIVE Final    Comment:        The GeneXpert MRSA Assay (FDA approved for NASAL specimens only), is one component of a comprehensive MRSA colonization surveillance program. It is not intended to diagnose MRSA infection nor to guide or monitor treatment for MRSA infections. Performed at [facility]  [place] [address], [place], [place] [address]       Radiology Studies: ECHOCARDIOGRAM COMPLETE  Result Date: 03/26/2019   ECHOCARDIOGRAM REPORT   Patient Name:   Janice Ryan Date of Exam: 03/26/2019 Medical Rec #:  [id number]             Height:       68.0 in Accession #:    [id number]            Weight:       370.4 lb Date of Birth:  [birth year]              BSA:          2.66 m Patient Age:    50 years              BP:           170/98 mmHg Patient Gender: F                     HR:           87 bpm. Exam Location:  Inpatient Procedure: 2D Echo, Color Doppler, Cardiac Doppler and Intracardiac            Opacification Agent Indications:    R06.9 DOE  History:        Patient has prior history of  Echocardiogram examinations, most                 recent 07/28/2018. Risk Factors:Hypertension.  Sonographer:    Raquel Sarna Senior RDCS Referring Phys: [id number] PETER E BABCOCK  Sonographer Comments: Technically difficult study due to poor echo windows and patient is morbidly obese. Scanned supine on BiPap. IMPRESSIONS  1. Left ventricular ejection fraction, by visual estimation, is >75%. The left ventricle has hyperdynamic function.  2. Left ventricular diastolic parameters are consistent with Grade I diastolic dysfunction (impaired relaxation).  3. The left ventricle has no regional wall motion abnormalities.  4. Global right ventricle has normal systolic function.The right ventricular size is normal. Right vetricular wall thickness was not assessed.  5. Left atrial size was normal.  6. Right atrial size was normal.  7. The mitral valve was not well visualized.  8. The tricuspid valve is not well visualized.  9. The aortic valve was not well visualized. 10. The pulmonic valve was not well visualized. 11. The aortic root was not well visualized. 12. The inferior vena cava is dilated in size with >50% respiratory variability, suggesting right atrial pressure of 8 mmHg. 13. The interatrial septum was not well visualized. 14. Extremely limited; definity used; hyperdynamic LV function; grade 1 diastolic dysfunction; no pericardial effusion; no other useful information can be obtained from this study. FINDINGS  Left Ventricle: Left ventricular ejection fraction, by visual estimation, is >75%. The left ventricle has hyperdynamic function. The left ventricle has no regional wall motion abnormalities. There is no left ventricular hypertrophy. Left ventricular diastolic parameters are consistent with Grade I diastolic dysfunction (impaired relaxation). Right Ventricle: The right ventricular size is normal.Global RV systolic function is has normal systolic function. Left Atrium: Left atrial size was normal in size. Right Atrium: Right  atrial size was normal in size Pericardium: There is no evidence of pericardial effusion. Mitral Valve: The mitral valve was not well visualized. Tricuspid Valve: The tricuspid valve is not well visualized. Aortic Valve: The aortic valve was not well visualized. Pulmonic Valve: The pulmonic valve was not well visualized. Aorta: The aortic root  was not well visualized. Venous: The inferior vena cava is dilated in size with greater than 50% respiratory variability, suggesting right atrial pressure of 8 mmHg. IAS/Shunts: The interatrial septum was not well visualized. Additional Comments: Extremely limited; definity used; hyperdynamic LV function; grade 1 diastolic dysfunction; no pericardial effusion; no other useful information can be obtained from this study.   Diastology LV e' lateral:   8.92 cm/s LV E/e' lateral: 9.5 LV e' medial:    6.74 cm/s LV E/e' medial:  12.6  LEFT ATRIUM             Index LA Vol (A2C):   65.9 ml 24.82 ml/m LA Vol (A4C):   58.6 ml 22.07 ml/m LA Biplane Vol: 63.4 ml 23.87 ml/m  AORTIC VALVE LVOT Vmax:   84.20 cm/s LVOT Vmean:  60.600 cm/s LVOT VTI:    0.150 m MITRAL VALVE MV Area (PHT): 2.56 cm              SHUNTS MV PHT:        85.84 msec            Systemic VTI: 0.15 m MV Decel Time: 296 msec MV E velocity: 84.80 cm/s  103 cm/s MV A velocity: 102.00 cm/s 70.3 cm/s MV E/A ratio:  0.83        1.5  Olga Millers MD Electronically signed by Olga Millers MD Signature Date/Time: 03/26/2019/3:44:37 PM    Final         Scheduled Meds: . albuterol  2.5 mg Nebulization BID  . amoxicillin-clavulanate  1 tablet Oral Q12H  . busPIRone  5 mg Oral TID  . chlorhexidine  15 mL Mouth Rinse BID  . chlorhexidine  15 mL Mouth Rinse BID  . Chlorhexidine Gluconate Cloth  6 each Topical Daily  . DULoxetine  60 mg Oral Daily  . enoxaparin (LOVENOX) injection  80 mg Subcutaneous QHS  . fluticasone  1 spray Each Nare Daily  . lisinopril  10 mg Oral Daily  . mouth rinse  15 mL Mouth Rinse  q12n4p  . mouth rinse  15 mL Mouth Rinse q12n4p  . multivitamin with minerals  1 tablet Oral Daily   Continuous Infusions:   Assessment & Plan:    Acute on chronic hypoxic and hypercapnic respiratory failure: chronically on 2.5 to 3 L at home. Reports history of asthma but unclear basis for this diagnosis. Patient has required 5 to 6 L O2 during hospitalization possibly related to multiple pain meds/sedatives she received with underlying asthma,sleep apnea and OHS-been non compliant with CPAP while here-very hypersomnolent on 12/23 prompting stat ABG-showed significant CO2 retention-pulmonary consulted and transferred to stepdown unit with BiPAP. CXR done 12/21 suggestive of atelectasis/pulmonary HTN, Prior echo -poor technique but suggestive of RV dysfunction.  Patient likely has obstructive sleep apnea with associated pulmonary hypertension/RV dysfunction.  Pulmonary recommends BiPAP in the long-term and trilogy to be set up upon discharge.  Social work consulted but patient may have insurance barriers.  Sleep study referral placed by pulmonary.  Patient is requiring 4 L high flow O2 when not on BiPAP to maintain O2 sats at target 88 to 92%--discussed with Dr. Chestine Spore, okay to transfer out of stepdown service and BiPAP at night. Work-up last week: COVID-19 test negative- No evidence of acute pulmonary edema but patient  received Lasix for leg edema in initial hospital course. 2D echo on 03/26/19 with similar findings as previous, EF >75%, nl RV function but severely limited in technique due to  body habitus.D-dimer 4.59.-CT angiogram of the chest 12/19-showed no PE/CHF/PNA.  Cellulitis vs reactive lymphangitis of both lower extremities with underlying chronic venous stasis: -Patient had erythema of both lower extremities, up to knees, also venous stasis-Doppler ultrasound of the lower extremities negative for DVT-Much improved, started on IV Rocephin  upon admission, also received Lasix in ED. White  count was 10.9 on presentation, peaked to 16.7 and now resolved. Afebrile   CT abnormality suggestive of pancreatic/colonic inflammation: CT chest incidentally reported new inflammatory stranding adjacent to tail of pancreas and splenic flexure suggestive of possible colitis diverticulitis or pancreatitis. Although Lipase elevated at 189, no acute abdominal complaints, nausea vomiting or diarrhea--findings could be related  To hypercalcemia or HCTZ that patient takes at home. Patient was placed on full liquids then increased to heart healthy, tolerating well.  Lasix was held, Flagyl was added on 12/19 for possible colitis. No fever but has had white count as discussed above. . Received short course of antibiotics- Augmentin can be stopped today.   Mild hypercalcemia- Likely related to chronicThiazide use and immobility. Calcium 11.3 noted at the time of admission,currently improved to 10.6 after d/ced HCTZ. Ionized calcium slightly up at 6, PTH levels wnl.No evidence of lung malignancy on CT chest.  BMP in a.m.  Mediastinal nodule/cyst::Prior CTA chest 07/28/2018 showed a 2.8 x 2.2 cm nodule in the anterior mediastinum. It was recommended to have follow-up chest CT in 3-6 months or further characterization with PET CT. Unclear follow-up and imaging was pursued. CT chest done this admission shows well-circumscribed solid nodule versus complex cyst in the anterior mediastinum, stable for last 8 months. Follow-up in 12 months recommended..  AMS/Hypersomnolence: Metabolic encephalopathy in the setting of OSA/CO2 narcosis exacerbated by benzo/opiate use.  Pulmonary recommends avoiding Xanax and opiates-they have discontinued these meds.  Added hydroxyzine for as needed use for anxiety.   Right knee pain-States she fell at home and had difficulty bearing weight on the right knee. X-ray of the right knee showed severe osteoarthritis,no fracture.  Continue local Voltaren gel and avoid opiates.  Patient was  requesting tramadol this morning--discussed at length with her and husband regarding pulmonary recommendations and risk of worsening mental status.  Hypertension-Currently fluctuating with SBP 130-150, continue lisinopril--increased dose to -watch potassium  Chronic leg edema/venous stasis- Currently stable,  Resume home diuretics-lasix   Generalized anxiety disorder:Continue BuSpar and Cymbalta.  Morbid obesity:BMI 56.2, counseled on diet and weight control   DVT prophylaxis: lovenox Code Status: Full  Family / Patient Communication: d/w paient at bedside and with husband over the phone.  Husband states he has sleep apnea and uses CPAP at home.  He reports noting apneic episodes and patient and apparently had advised her to seek medical attention which she had been neglecting.  He understands the implications of taking sedatives/narcotics given ongoing concerns.  He is agreeable with the current plan.  Also informed husband regarding patient's unwillingness to work with PT --he stated he will discuss with patient and encourage her to participate when she is medically ready. Disposition Plan: PT recommended SNF initially but patient not very cooperative during inpatient PT and per case management does not have insurance coverage.  Per Dr. Chestine Spore, if Medicaid pending, patient should qualify for home trilogy given obesity hypoventilation syndrome, sleep apnea and associated acute on chronic hypercapnic respiratory failure.  May need repeat ABG on Sunday (DME companies may need repeat study within 48 hours of discharge).  She will need to be discharged with home health  services when medically stable. Patient may be transferred out of stepdown unit today.  Resume BiPAP at night.   LOS: 7 days    Time spent: 35 minutes    Alessandra Bevels, MD [facility] Pager [phone]  If 7PM-7AM, please contact night-coverage [email or web address] Password [email or web address] 03/28/2019, 8:12 AM

## 2019-03-29 LAB — BASIC METABOLIC PANEL
Anion gap: 9 (ref 5–15)
BUN: 10 mg/dL (ref 8–23)
CO2: 42 mmol/L — ABNORMAL HIGH (ref 22–32)
Calcium: 10.6 mg/dL — ABNORMAL HIGH (ref 8.9–10.3)
Chloride: 89 mmol/L — ABNORMAL LOW (ref 98–111)
Creatinine, Ser: 0.63 mg/dL (ref 0.44–1.00)
GFR calc Af Amer: >60 mL/min (ref >60)
GFR calc non Af Amer: >60 mL/min (ref >60)
Glucose, Bld: 106 mg/dL — ABNORMAL HIGH (ref 70–99)
Potassium: 4 mmol/L (ref 3.5–5.1)
Sodium: 140 mmol/L (ref 135–145)

## 2019-03-29 NOTE — Progress Notes (Addendum)
PROGRESS NOTE  Janice JulianDonna Ryan ZOX:[id number] DOB: 04/19/1955 DOA: 03/20/2019 PCP: Cain SaupeFulp, Cammie, MD  HPI/Recap of past 24 hours: Subjective: Patient seen and examined at bedside complained that she feels uncomfortable the bed is uncomfortable and she is not able to take care of herself at home her husband is not able to or unwilling to take care of her also she has a handicapped son.    Assessment/Plan: Principal Problem:   Cellulitis of both lower extremities Active Problems:   Chronic respiratory failure with hypoxia (HCC)   HTN (hypertension)   GAD (generalized anxiety disorder)   Cellulitis  Acute on chronic hypoxia with hypercarbic respiratory failure continue current management  2.  Cellulitis reactive lymphangitis of both lower extremity with underlying chronic venous stasis dermatitis continue current management  3.  Mild hypercalcemia, improved slightly to 10.6 #4 altered mental status with hypersomnolence is improved or resolved  4.  Morbid obesity BMI 56.2 counseled on diet and weight control when her medical condition improves.  Right knee pain x-ray of the right knee show severe osteoarthritis but there was no fracture she will continue current management Code Status: Full  Severity of Illness: The appropriate patient status for this patient is INPATIENT. Inpatient status is judged to be reasonable and necessary in order to provide the required intensity of service to ensure the patient's safety. The patient's presenting symptoms, physical exam findings, and initial radiographic and laboratory data in the context of their chronic comorbidities is felt to place them at high risk for further clinical deterioration. Furthermore, it is not anticipated that the patient will be medically stable for discharge from the hospital within 2 midnights of admission. The following factors support the patient status of inpatient.  Patient waiting for SNF " * I certify that at the  point of admission it is my clinical judgment that the patient will require inpatient hospital care spanning beyond 2 midnights from the point of admission due to high intensity of service, high risk for further deterioration and high frequency of surveillance required.*    Family Communication: Melissa and her sister in the room bedside  Disposition Plan: SNF   Consultants:  None  Procedures:  None  Antimicrobials:  Augmentin  DVT prophylaxis: Lovenox   Objective: Vitals:   03/28/19 2122 03/29/19 0445 03/29/19 0811 03/29/19 1014  BP: (!) 125/59 (!) 152/64  (!) 155/62  Pulse: 88 85    Resp: 20 20    Temp: 99 F (37.2 C) 98.1 F (36.7 C)    TempSrc:  Oral    SpO2: 93% 98% 94%   Weight:      Height:        Intake/Output Summary (Last 24 hours) at 03/29/2019 1028 Last data filed at 03/29/2019 0431 Gross per 24 hour  Intake --  Output 1200 ml  Net -1200 ml   Filed Weights   03/25/19 0500 03/27/19 0500 03/28/19 0410  Weight: (!) 168 kg (!) 225 kg (!) 231 kg   Body mass index is 77.43 kg/m.  Exam:  . General: 63 y.o. year-old female well developed well nourished in no acute distress.  Alert and oriented x3. . Cardiovascular: Regular rate and rhythm with no rubs or gallops.  No thyromegaly or JVD noted.   Marland Kitchen. Respiratory: Clear to auscultation with no wheezes or rales. Good inspiratory effort. . Abdomen: Soft nontender nondistended with normal bowel sounds x4 quadrants. . Musculoskeletal: No lower extremity edema. 2/4 pulses in all 4 extremities. . Skin: No ulcerative  lesions noted or rashes, . Psychiatry: Mood is appropriate for condition and setting    Data Reviewed: CBC: Recent Labs  Lab 03/23/19 0438 03/24/19 0420 03/26/19 1520 03/27/19 0146  WBC 16.7* 15.9* 10.0 9.4  HGB 11.2* 10.7* 11.0* 10.9*  HCT 39.0 36.1 37.3 38.3  MCV 98.5 98.1 98.4 99.5  PLT 256 249 288 290   Basic Metabolic Panel: Recent Labs  Lab 03/23/19 0438 03/24/19 0420  03/26/19 1520 03/27/19 1214 03/29/19 0547  NA 137  --  139  --  140  K 3.7  --  4.0  --  4.0  CL 90*  --  89*  --  89*  CO2 37*  --  42*  --  42*  GLUCOSE 97  --  99  --  106*  BUN 12  --  13  --  10  CREATININE 0.81  --  0.70  --  0.63  CALCIUM 10.6* 10.8* 10.6* 10.8* 10.6*   GFR: Estimated Creatinine Clearance: 152.4 mL/min (by C-G formula based on SCr of 0.63 mg/dL). Liver Function Tests: No results for input(s): AST, ALT, ALKPHOS, BILITOT, PROT, ALBUMIN in the last 168 hours. Recent Labs  Lab 03/22/19 1109 03/23/19 0438  LIPASE 189* 108*   No results for input(s): AMMONIA in the last 168 hours. Coagulation Profile: No results for input(s): INR, PROTIME in the last 168 hours. Cardiac Enzymes: No results for input(s): CKTOTAL, CKMB, CKMBINDEX, TROPONINI in the last 168 hours. BNP (last 3 results) No results for input(s): PROBNP in the last 8760 hours. HbA1C: No results for input(s): HGBA1C in the last 72 hours. CBG: No results for input(s): GLUCAP in the last 168 hours. Lipid Profile: No results for input(s): CHOL, HDL, LDLCALC, TRIG, CHOLHDL, LDLDIRECT in the last 72 hours. Thyroid Function Tests: No results for input(s): TSH, T4TOTAL, FREET4, T3FREE, THYROIDAB in the last 72 hours. Anemia Panel: Recent Labs    03/27/19 1214  VITAMINB12 837  FERRITIN 153  TIBC 263  IRON 38   Urine analysis: No results found for: COLORURINE, APPEARANCEUR, LABSPEC, PHURINE, GLUCOSEU, HGBUR, BILIRUBINUR, KETONESUR, PROTEINUR, UROBILINOGEN, NITRITE, LEUKOCYTESUR Sepsis Labs: @LABRCNTIP (procalcitonin:4,lacticidven:4)  ) Recent Results (from the past 240 hour(s))  SARS CORONAVIRUS 2 (TAT 6-24 HRS) Nasopharyngeal Nasopharyngeal Swab     Status: None   Collection Time: 03/20/19  5:24 PM   Specimen: Nasopharyngeal Swab  Result Value Ref Range Status   SARS Coronavirus 2 NEGATIVE NEGATIVE Final    Comment: (NOTE) SARS-CoV-2 target nucleic acids are NOT DETECTED. The SARS-CoV-2  RNA is generally detectable in upper and lower respiratory specimens during the acute phase of infection. Negative results do not preclude SARS-CoV-2 infection, do not rule out co-infections with other pathogens, and should not be used as the sole basis for treatment or other patient management decisions. Negative results must be combined with clinical observations, patient history, and epidemiological information. The expected result is Negative. Fact Sheet for Patients: 03/22/19 Fact Sheet for Healthcare Providers: HairSlick.no This test is not yet approved or cleared by the quierodirigir.com FDA and  has been authorized for detection and/or diagnosis of SARS-CoV-2 by FDA under an Emergency Use Authorization (EUA). This EUA will remain  in effect (meaning this test can be used) for the duration of the COVID-19 declaration under Section 56 4(b)(1) of the Act, 21 U.S.C. section 360bbb-3(b)(1), unless the authorization is terminated or revoked sooner. Performed at [facility], [address], [place], [place]   MRSA PCR Screening  Status: None   Collection Time: 03/26/19  2:11 PM   Specimen: Nasal Mucosa; Nasopharyngeal  Result Value Ref Range Status   MRSA by PCR NEGATIVE NEGATIVE Final    Comment:        The GeneXpert MRSA Assay (FDA approved for NASAL specimens only), is one component of a comprehensive MRSA colonization surveillance program. It is not intended to diagnose MRSA infection nor to guide or monitor treatment for MRSA infections. Performed at [facility], [place] [address] [place], [place] [address]       Studies: No results found.  Scheduled Meds: . albuterol  2.5 mg Nebulization BID  . amoxicillin-clavulanate  1 tablet Oral Q12H  . busPIRone  5 mg Oral TID  . chlorhexidine  15 mL Mouth Rinse BID  . Chlorhexidine Gluconate Cloth  6 each Topical Daily  .  DULoxetine  60 mg Oral Daily  . enoxaparin (LOVENOX) injection  80 mg Subcutaneous QHS  . fluticasone  1 spray Each Nare Daily  . furosemide  20 mg Oral Daily  . lisinopril  20 mg Oral Daily  . mouth rinse  15 mL Mouth Rinse q12n4p  . multivitamin with minerals  1 tablet Oral Daily    Continuous Infusions:   LOS: 8 days   Time spent greater than 25 minutes  Cristal Deer, MD [facility]  To reach me or the doctor on call, go to: [email or web address] Password [facility]  03/29/2019, 10:28 AM

## 2019-03-30 ENCOUNTER — Inpatient Hospital Stay (HOSPITAL_COMMUNITY): Payer: Self-pay

## 2019-03-30 MED ORDER — GERHARDT'S BUTT CREAM
TOPICAL_CREAM | Freq: Two times a day (BID) | CUTANEOUS | Status: DC
  Filled 2019-03-30 (×2): qty 1

## 2019-03-30 MED ORDER — LIP MEDEX EX OINT
TOPICAL_OINTMENT | CUTANEOUS | Status: AC
  Filled 2019-03-30: qty 7

## 2019-03-30 NOTE — Progress Notes (Addendum)
CSW spoke to the pt who states she lives with her son who is 56 and who is in grad school in an apartment.  Pt states she would prefer going home with [facility] with PT/OT rather than with a SNF inpatient rehab.   Pt confirmed that she has no insurance at this time and would need some form of charity HH as pt is not working at this time.  CSW will alert the [facility] RN CM of pt's needs for charity [facility] opportunities.  1:06 PM TOC RN CM updated.  Please reconsult if future social work needs arise.  CSW signing off, as social work intervention is no longer needed.  Alphonse Guild. Devaeh Amadi, LCSW, LCAS, CSI Transitions of Care Clinical Social Worker Care Coordination Department Ph: [phone]

## 2019-03-30 NOTE — Progress Notes (Signed)
RT called at shift change about patient not wanting to continue BiPAP QHS during hospital stay.Upon talking to patient, consequences of not wearing BiPAP QHS were discussed along with her other concerns. Patient recently had been transferred to ICU and had been on cont BiPAP and transferred back to telemetry; patient acknowledged understanding on potential concerns of not using as recommended.  Further conversations with patient raised concerns of her care here and how she feels stuck in the bed all the time, concerns about her family/discharge, and the financial aspects of her admission and being able to afford the BiPAP machine she will need at home. She states she is willing to continue to wear it, but would like answers on what it would look like at discharge and cost (which she states she will need to discuss with family). Concerns relayed to bedside RN, Romelle Starcher. RT will continue to encourage patient to wear BiPAP QHS and for naps.

## 2019-03-30 NOTE — Progress Notes (Addendum)
Consult request has been received. CSW attempting to follow up at present time.  CSW received a call from pt's RN who described pt's need for either [facility] or SNF and assistance with decision-making by the [facility] CSW, as well as the need for assistance with oxygen needs for which pt's RN will call the [place], LCSW, [place] Social Worker [phone]

## 2019-03-30 NOTE — Progress Notes (Signed)
CSW called and attempted to speak to the pt who, per the RN, has a cell phone which is not charged up.  CSW attempted to call pt's room phone x 2 but pt does not answer.  CSW requesting assistance from RN who will provide pt with CSW's phone number.  CSW will continue to follow for D/C needs.  Alphonse Guild. Jaydence Arnesen, LCSW, LCAS, CSI Transitions of Care Clinical Social Worker Care Coordination Department Ph: [phone]

## 2019-03-30 NOTE — Progress Notes (Signed)
Physical Therapy Treatment Patient Details Name: Janice Ryan MRN: [id number] DOB: [birth year] Today's Date: 03/30/2019    History of Present Illness 63 yo female admitted with LE cellulitis. Hx of morbid obesity, asthma, anxiety, chronic R knee pain    PT Comments    The patient is very motivated today to get OOB. Appropriate equipment for safest transfers obtained. Placed  Neoprene sleeve on right knee. Assisted with 2 persons to sit on edge of bed after air mattress deflated to prevent sliiding forward. @ assist to stand from bed , small pivot steps  To recliner with decease control of descent. Patient on  3 L [place] , SPO2 95%. Patient  Tolerated well and very happy to be OOB. Patient has an apt up 22 steps that is a challenge for her to actively perform or for  Transportation to assist due to patient size. Continue to recommend SNF, patient agreeable today.    Follow Up Recommendations  SNF     Equipment Recommendations  Wheelchair cushion (measurements PT)    Recommendations for Other Services       Precautions / Restrictions Precautions Precautions: Fall Precaution Comments: O2 dep 2-3L @ baseline Required Braces or Orthoses: Other Brace Other Brace: neoprene sleeve for right knee    Mobility  Bed Mobility Overal bed mobility: Needs Assistance Bed Mobility: Supine to Sit     Supine to sit: Mod assist     General bed mobility comments: patient rotated self on  bed using rails, assist to sit upright to bed edge.  Transfers Overall transfer level: Needs assistance Equipment used: Rolling walker (2 wheeled) Transfers: Sit to/from [facility] Sit to Stand: Mod assist;+2 physical assistance;+2 safety/equipment         General transfer comment: Assist to power up, control descent. VCs safety, technique.  Ambulation/Gait                 Stairs             Wheelchair Mobility    Modified Rankin (Stroke Patients Only)        Balance Overall balance assessment: Needs assistance         Standing balance support: Bilateral upper extremity supported Standing balance-Leahy Scale: Fair                              Cognition Arousal/Alertness: Awake/alert                                            Exercises      General Comments        Pertinent Vitals/Pain Faces Pain Scale: Hurts little more Pain Location: R knee Pain Descriptors / Indicators: Sore;Discomfort Pain Intervention(s): Monitored during session    Home Living                      Prior Function            PT Goals (current goals can now be found in the care plan section) Progress towards PT goals: Progressing toward goals    Frequency    Min 2X/week      PT Plan Current plan remains appropriate;Frequency needs to be updated    Co-evaluation              AM-PAC PT "6 Clicks"  Mobility   Outcome Measure  Help needed turning from your back to your side while in a flat bed without using bedrails?: A Lot Help needed moving from lying on your back to sitting on the side of a flat bed without using bedrails?: A Lot Help needed moving to and from a bed to a chair (including a wheelchair)?: A Lot Help needed standing up from a chair using your arms (e.g., wheelchair or bedside chair)?: A Lot Help needed to walk in hospital room?: A Lot Help needed climbing 3-5 steps with a railing? : Total 6 Click Score: 11    End of Session Equipment Utilized During Treatment: Oxygen Activity Tolerance: Patient limited by fatigue Patient left: in chair;with call bell/phone within reach Nurse Communication: Mobility status PT Visit Diagnosis: Muscle weakness (generalized) (M62.81);Difficulty in walking, not elsewhere classified (R26.2);Pain Pain - Right/Left: Right Pain - part of body: Knee     Time: 1430-1541 PT Time Calculation (min) (ACUTE ONLY): 71 min  Charges:  $Therapeutic  Activity: 68-82 mins                     Blanchard Kelch PT Acute Rehabilitation Services Pager [phone] Office [phone]    Rada Hay 03/30/2019, 3:54 PM

## 2019-03-30 NOTE — Progress Notes (Signed)
Pt was sitting bedside when I arrived. She was very open Chaplain visit and immediately asked for prayer. Our visit was short as Engineering geologist arrived prior to prayer. Pt has also expressed that she was tired. I offered continued support if needed. Please page [phone] if needed. [place], MDiv   03/30/19 1700  Clinical Encounter Type  Visited With Patient

## 2019-03-30 NOTE — Progress Notes (Addendum)
PROGRESS NOTE  Janice Ryan FIE:[id number] DOB: 05-May-1955 DOA: 03/20/2019 PCP: Antony Blackbird, MD  HPI/Recap of past 24 hours: Subjective: Patient seen and examined at bedside complained that she feels uncomfortable the bed is uncomfortable and she is not able to take care of herself at home her husband is not able to or unwilling to take care of her also she has a handicapped son.  March 30, 2019: Subjective: Patient seen and examined at bedside she is in a better mood today she says she feels better.  Also she still has redness in her leg left leg from the cellulitis.  She is also complaining of right knee pain and she used to get a pain cream as outpatient with used to help a little bit but not a whole lot.  She stated she wants something done about the leg.  I will order  knee brace  Assessment/Plan: Principal Problem:   Cellulitis of both lower extremities Active Problems:   Chronic respiratory failure with hypoxia (HCC)   HTN (hypertension)   GAD (generalized anxiety disorder)   Cellulitis  Acute on chronic hypoxia with hypercarbic respiratory failure continue current management  2.  Cellulitis reactive lymphangitis of both lower extremity with underlying chronic venous stasis dermatitis continue current management with Augmentin  3.  Mild hypercalcemia, improved slightly to 10.6 #4 altered mental status with hypersomnolence is improved or resolved  4.  Morbid obesity BMI 56.2 counseled on diet and weight control when her medical condition improves.  Right knee pain x-ray of the right knee show severe osteoarthritis but there was no fracture she will continue current management.  I will order knee brace and physical therapy  Code Status: Full  Severity of Illness: The appropriate patient status for this patient is INPATIENT. Inpatient status is judged to be reasonable and necessary in order to provide the required intensity of service to ensure the patient's  safety. The patient's presenting symptoms, physical exam findings, and initial radiographic and laboratory data in the context of their chronic comorbidities is felt to place them at high risk for further clinical deterioration. Furthermore, it is not anticipated that the patient will be medically stable for discharge from the hospital within 2 midnights of admission. The following factors support the patient status of inpatient.  Patient waiting for SNF " * I certify that at the point of admission it is my clinical judgment that the patient will require inpatient hospital care spanning beyond 2 midnights from the point of admission due to high intensity of service, high risk for further deterioration and high frequency of surveillance required.*    Family Communication: Melissa and her sister in the room bedside  Disposition Plan: Possibly home with home health and DME.  See physical therapy recommendation   Consultants:  None  Procedures:  None  Antimicrobials:  Augmentin  DVT prophylaxis: Lovenox   Objective: Vitals:   03/30/19 0906 03/30/19 1313 03/30/19 1400 03/30/19 1516  BP:  (!) 98/58    Pulse:  100    Resp:  20    Temp:  98.2 F (36.8 C)    TempSrc:  Oral    SpO2: 95% 96%  95%  Weight:   (!) 264.9 kg   Height:        Intake/Output Summary (Last 24 hours) at 03/30/2019 1557 Last data filed at 03/30/2019 1312 Gross per 24 hour  Intake 600 ml  Output 1100 ml  Net -500 ml   Filed Weights   03/27/19 0500  03/28/19 0410 03/30/19 1400  Weight: (!) 225 kg (!) 231 kg (!) 264.9 kg   Body mass index is 88.8 kg/m.  Exam:  . General: 63 y.o. year-old female well developed well nourished in no acute distress.  Alert and oriented x3. . Cardiovascular: Regular rate and rhythm with no rubs or gallops.  No thyromegaly or JVD noted.   Marland Kitchen. Respiratory: Clear to auscultation with no wheezes or rales. Good inspiratory effort. . Abdomen: Soft nontender nondistended with  normal bowel sounds x4 quadrants. . Musculoskeletal: No lower extremity edema. 2/4 pulses in all 4 extremities. . Skin: No ulcerative lesions noted or rashes, this is slides redness of pinkish discoloration of the left lower extremity there is no warmth . Psychiatry: Mood is appropriate for condition and setting    Data Reviewed: CBC: Recent Labs  Lab 03/24/19 0420 03/26/19 1520 03/27/19 0146  WBC 15.9* 10.0 9.4  HGB 10.7* 11.0* 10.9*  HCT 36.1 37.3 38.3  MCV 98.1 98.4 99.5  PLT 249 288 290   Basic Metabolic Panel: Recent Labs  Lab 03/24/19 0420 03/26/19 1520 03/27/19 1214 03/29/19 0547  NA  --  139  --  140  K  --  4.0  --  4.0  CL  --  89*  --  89*  CO2  --  42*  --  42*  GLUCOSE  --  99  --  106*  BUN  --  13  --  10  CREATININE  --  0.70  --  0.63  CALCIUM 10.8* 10.6* 10.8* 10.6*   GFR: Estimated Creatinine Clearance: 168.2 mL/min (by C-G formula based on SCr of 0.63 mg/dL). Liver Function Tests: No results for input(s): AST, ALT, ALKPHOS, BILITOT, PROT, ALBUMIN in the last 168 hours. No results for input(s): LIPASE, AMYLASE in the last 168 hours. No results for input(s): AMMONIA in the last 168 hours. Coagulation Profile: No results for input(s): INR, PROTIME in the last 168 hours. Cardiac Enzymes: No results for input(s): CKTOTAL, CKMB, CKMBINDEX, TROPONINI in the last 168 hours. BNP (last 3 results) No results for input(s): PROBNP in the last 8760 hours. HbA1C: No results for input(s): HGBA1C in the last 72 hours. CBG: No results for input(s): GLUCAP in the last 168 hours. Lipid Profile: No results for input(s): CHOL, HDL, LDLCALC, TRIG, CHOLHDL, LDLDIRECT in the last 72 hours. Thyroid Function Tests: No results for input(s): TSH, T4TOTAL, FREET4, T3FREE, THYROIDAB in the last 72 hours. Anemia Panel: No results for input(s): VITAMINB12, FOLATE, FERRITIN, TIBC, IRON, RETICCTPCT in the last 72 hours. Urine analysis: No results found for: COLORURINE,  APPEARANCEUR, LABSPEC, PHURINE, GLUCOSEU, HGBUR, BILIRUBINUR, KETONESUR, PROTEINUR, UROBILINOGEN, NITRITE, LEUKOCYTESUR Sepsis Labs: @LABRCNTIP (procalcitonin:4,lacticidven:4)  ) Recent Results (from the past 240 hour(s))  SARS CORONAVIRUS 2 (TAT 6-24 HRS) Nasopharyngeal Nasopharyngeal Swab     Status: None   Collection Time: 03/20/19  5:24 PM   Specimen: Nasopharyngeal Swab  Result Value Ref Range Status   SARS Coronavirus 2 NEGATIVE NEGATIVE Final    Comment: (NOTE) SARS-CoV-2 target nucleic acids are NOT DETECTED. The SARS-CoV-2 RNA is generally detectable in upper and lower respiratory specimens during the acute phase of infection. Negative results do not preclude SARS-CoV-2 infection, do not rule out co-infections with other pathogens, and should not be used as the sole basis for treatment or other patient management decisions. Negative results must be combined with clinical observations, patient history, and epidemiological information. The expected result is Negative. Fact Sheet for Patients: HairSlick.nohttps://www.fda.gov/media/138098/download Fact Sheet for  Healthcare Providers: quierodirigir.com This test is not yet approved or cleared by the [place] and  has been authorized for detection and/or diagnosis of SARS-CoV-2 by FDA under an Emergency Use Authorization (EUA). This EUA will remain  in effect (meaning this test can be used) for the duration of the COVID-19 declaration under Section 56 4(b)(1) of the Act, 21 U.S.C. section 360bbb-3(b)(1), unless the authorization is terminated or revoked sooner. Performed at [facility], [address], [place], [place] [address]   MRSA PCR Screening     Status: None   Collection Time: 03/26/19  2:11 PM   Specimen: Nasal Mucosa; Nasopharyngeal  Result Value Ref Range Status   MRSA by PCR NEGATIVE NEGATIVE Final    Comment:        The GeneXpert MRSA Assay (FDA approved for NASAL specimens only),  is one component of a comprehensive MRSA colonization surveillance program. It is not intended to diagnose MRSA infection nor to guide or monitor treatment for MRSA infections. Performed at [facility], [address] [place], [place] [address]       Studies: No results found.  Scheduled Meds: . amoxicillin-clavulanate  1 tablet Oral Q12H  . busPIRone  5 mg Oral TID  . chlorhexidine  15 mL Mouth Rinse BID  . Chlorhexidine Gluconate Cloth  6 each Topical Daily  . DULoxetine  60 mg Oral Daily  . enoxaparin (LOVENOX) injection  80 mg Subcutaneous QHS  . fluticasone  1 spray Each Nare Daily  . furosemide  20 mg Oral Daily  . Gerhardt's butt cream   Topical BID  . lip balm      . lisinopril  20 mg Oral Daily  . mouth rinse  15 mL Mouth Rinse q12n4p  . multivitamin with minerals  1 tablet Oral Daily    Continuous Infusions:   LOS: 9 days   Time spent greater than 25 minutes  Myrtie Neither, MD [facility]  To reach me or the doctor on call, go to: [email or web address] Password [facility]  03/30/2019, 3:57 PM

## 2019-03-31 ENCOUNTER — Telehealth: Payer: Self-pay | Admitting: Internal Medicine

## 2019-03-31 DIAGNOSIS — F330 Major depressive disorder, recurrent, mild: Secondary | ICD-10-CM | POA: Diagnosis present

## 2019-03-31 DIAGNOSIS — F32A Depression, unspecified: Secondary | ICD-10-CM | POA: Diagnosis present

## 2019-03-31 DIAGNOSIS — F329 Major depressive disorder, single episode, unspecified: Secondary | ICD-10-CM | POA: Diagnosis present

## 2019-03-31 LAB — CBC
HCT: 36.6 % (ref 36.0–46.0)
Hemoglobin: 10.8 g/dL — ABNORMAL LOW (ref 12.0–15.0)
MCH: 28.6 pg (ref 26.0–34.0)
MCHC: 29.5 g/dL — ABNORMAL LOW (ref 30.0–36.0)
MCV: 97.1 fL (ref 80.0–100.0)
Platelets: 322 10*3/uL (ref 150–400)
RBC: 3.77 MIL/uL — ABNORMAL LOW (ref 3.87–5.11)
RDW: 12.9 % (ref 11.5–15.5)
WBC: 8.3 10*3/uL (ref 4.0–10.5)
nRBC: 0 % (ref 0.0–0.2)

## 2019-03-31 LAB — BASIC METABOLIC PANEL
Anion gap: 8 (ref 5–15)
BUN: 10 mg/dL (ref 8–23)
CO2: 38 mmol/L — ABNORMAL HIGH (ref 22–32)
Calcium: 10.4 mg/dL — ABNORMAL HIGH (ref 8.9–10.3)
Chloride: 91 mmol/L — ABNORMAL LOW (ref 98–111)
Creatinine, Ser: 0.79 mg/dL (ref 0.44–1.00)
GFR calc Af Amer: >60 mL/min (ref >60)
GFR calc non Af Amer: >60 mL/min (ref >60)
Glucose, Bld: 152 mg/dL — ABNORMAL HIGH (ref 70–99)
Potassium: 4.2 mmol/L (ref 3.5–5.1)
Sodium: 137 mmol/L (ref 135–145)

## 2019-03-31 NOTE — Telephone Encounter (Signed)
Patient still admitted to hospital - will contact pt for sleep consult as soon as she is released -pr

## 2019-03-31 NOTE — Progress Notes (Signed)
Physical Therapy Treatment Patient Details Name: Janice Ryan MRN: [id number] DOB: 1955/06/04 Today's Date: 03/31/2019    History of Present Illness 63 yo female admitted with LE cellulitis. Hx of morbid obesity, asthma, anxiety, chronic R knee pain    PT Comments    Assisted pt OOB to recliner.  Pt requested I apply her R knee sleeve which she brought from home.  General bed mobility comments: patient rotated self on  bed using rails, assist with upper body to sit upright to bed edge. General transfer comment: pt was able to self stand by rocking forward to shift weight anteriorly and use momentum to stand. General Gait Details: pt only agreed to take a few pivot steps from bed to recliner.  Remained on 3 lts nasal avg sats 96%.      Per eval pt has 22 steps to get into her apartment?   Follow Up Recommendations  SNF     Equipment Recommendations  Wheelchair cushion (measurements PT)(bari)    Recommendations for Other Services       Precautions / Restrictions Precautions Precautions: Fall Precaution Comments: O2 dep 2-3L @ baseline Required Braces or Orthoses: Other Brace Other Brace: neoprene sleeve for right knee    Mobility  Bed Mobility Overal bed mobility: Needs Assistance Bed Mobility: Supine to Sit     Supine to sit: Mod assist     General bed mobility comments: patient rotated self on  bed using rails, assist with upper body to sit upright to bed edge.  Transfers Overall transfer level: Needs assistance Equipment used: Rolling walker (2 wheeled) Transfers: Sit to/from [employer] Sit to Stand: Min guard;Min assist Stand pivot transfers: Min guard;Min assist       General transfer comment: pt was able to self stand by rocking forward to shift weight anteriorly and use momentum to stand.  Ambulation/Gait Ambulation/Gait assistance: Supervision;Min guard Gait Distance (Feet): 1 Feet Assistive device: Rolling walker (2  wheeled) Gait Pattern/deviations: Step-through pattern;Decreased stride length;Wide base of support     General Gait Details: pt only agreed to take a few pivot steps from bed to recliner   Stairs             Wheelchair Mobility    Modified Rankin (Stroke Patients Only)       Balance                                            Cognition Arousal/Alertness: Awake/alert Behavior During Therapy: WFL for tasks assessed/performed Overall Cognitive Status: Within Functional Limits for tasks assessed                                        Exercises      General Comments        Pertinent Vitals/Pain Pain Assessment: 0-10 Faces Pain Scale: Hurts little more Pain Location: R knee Pain Descriptors / Indicators: Sore;Discomfort Pain Intervention(s): Monitored during session    Home Living                      Prior Function            PT Goals (current goals can now be found in the care plan section) Progress towards PT goals: Progressing toward goals  Frequency    Min 2X/week      PT Plan Current plan remains appropriate;Frequency needs to be updated    Co-evaluation              AM-PAC PT "6 Clicks" Mobility   Outcome Measure  Help needed turning from your back to your side while in a flat bed without using bedrails?: A Little Help needed moving from lying on your back to sitting on the side of a flat bed without using bedrails?: A Little   Help needed standing up from a chair using your arms (e.g., wheelchair or bedside chair)?: A Little Help needed to walk in hospital room?: A Little Help needed climbing 3-5 steps with a railing? : Total 6 Click Score: 13    End of Session Equipment Utilized During Treatment: Oxygen Activity Tolerance: Patient limited by fatigue Patient left: in chair;with call bell/phone within reach Nurse Communication: Mobility status PT Visit Diagnosis: Muscle weakness  (generalized) (M62.81);Difficulty in walking, not elsewhere classified (R26.2);Pain Pain - Right/Left: Right Pain - part of body: Knee     Time: 1015-1040 PT Time Calculation (min) (ACUTE ONLY): 25 min  Charges:  $Gait Training: 8-22 mins $Therapeutic Activity: 8-22 mins                     {Grissel Tyrell  PTA Acute  Rehabilitation Services Pager      [phone] Office      [phone]

## 2019-03-31 NOTE — Consult Note (Signed)
[facility] Face-to-Face Psychiatry Consult   Reason for Consult:  Depression and anxiety Referring Physician:  Dr Jomarie Longs Patient Identification: Janice Ryan MRN:  [id number] Principal Diagnosis: Cellulitis of both lower extremities Diagnosis:  Principal Problem:   Cellulitis of both lower extremities Active Problems:   Anxiety state   Major depressive disorder, recurrent episode, mild (HCC)   Chronic respiratory failure with hypoxia (HCC)   HTN (hypertension)   Cellulitis  Total Time spent with patient: 1 hour  Subjective:   Janice Ryan is a 63 y.o. female patient admitted with cellulitis.  Patient seen and evaluated in person by this provider for depression and anxiety.  She reports a history of depression, anxiety and ADHD.  She [place] in [place] her with BuSpar, Cymbalta, gabapentin, Xanax, Adderall, and Ambien.  Discussed the issues with sedating medications and difficulty breathing.  Perseverates on the use of Adderall in her life going south when this was no longer prescribe and gaining 150 pounds.  Has not had these medications since 2019 per [place] MDP.  She does report the gabapentin 300 mg 3 times daily did help her significantly with mobility, pain, and anxiety.  Recommend starting 100 mg twice daily or 3 times daily.  With her the need for psycho metric testing to prescribe controlled substance like Adderall.  She would need to go to outpatient services to obtain this type of testing.  Engaged well in this assessment.  Denies suicidal/homicidal ideations, hallucinations, and subs abuse.  She is scheduled today to go to rehab to increase her strength to return home where she lives with her husband.  Recommendations placed below.  HPI per MD:  Janice Ryan is a 63 y.o. female with medical history significant for asthma with chronic respiratory failure on 2 L supplemental O2 at home, hypertension, generalized anxiety disorder, and morbid  obesity who presents to the ED for evaluation of erythema and pain on both of her lower extremities.  Unable to obtain history from patient at time of admission due to her hypersomnolence therefore entirety of history is obtained from EDP and chart review.  Patient had a telemedicine visit with her PCP on 03/18/2019 for symptoms of itching and burning in both of her feet which has been ongoing for 1.5 weeks.  She was reportedly seen in the ED and given antibiotics amoxicillin 250 mg/day.  She felt that her reported cellulitis was not improving.  Her antibiotics were switched to Keflex however per pharmacy notes she has not yet picked this up.  She came to the ED for further evaluation and management.  Past Psychiatric History: depression and anxiety  Risk to Self:   Risk to Others:   Prior Inpatient Therapy:   Prior Outpatient Therapy:    Past Medical History:  Past Medical History:  Diagnosis Date  . Acute respiratory failure with hypoxia (HCC)   . Asthma   . Hypertension   . Morbid obesity (HCC)     Past Surgical History:  Procedure Laterality Date  . HERNIA REPAIR    . JOINT REPLACEMENT Left    knee   Family History:  Family History  Problem Relation Age of Onset  . Atrial fibrillation Mother   . Congestive Heart Failure Father    Family Psychiatric  History: none Social History:  Social History   Substance and Sexual Activity  Alcohol Use Never     Social History   Substance and Sexual Activity  Drug Use Never    Social History  Socioeconomic History  . Marital status: Married    Spouse name: Not on file  . Number of children: Not on file  . Years of education: Not on file  . Highest education level: Not on file  Occupational History  . Not on file  Tobacco Use  . Smoking status: Never Smoker  . Smokeless tobacco: Never Used  Substance and Sexual Activity  . Alcohol use: Never  . Drug use: Never  . Sexual activity: Not on file  Other Topics Concern   . Not on file  Social History Narrative  . Not on file   Social Determinants of Health   Financial Resource Strain:   . Difficulty of Paying Living Expenses: Not on file  Food Insecurity:   . Worried About Charity fundraiser in the Last Year: Not on file  . Ran Out of Food in the Last Year: Not on file  Transportation Needs:   . Lack of Transportation (Medical): Not on file  . Lack of Transportation (Non-Medical): Not on file  Physical Activity:   . Days of Exercise per Week: Not on file  . Minutes of Exercise per Session: Not on file  Stress:   . Feeling of Stress : Not on file  Social Connections:   . Frequency of Communication with Friends and Family: Not on file  . Frequency of Social Gatherings with Friends and Family: Not on file  . Attends Religious Services: Not on file  . Active Member of Clubs or Organizations: Not on file  . Attends Archivist Meetings: Not on file  . Marital Status: Not on file   Additional Social History:    Allergies:  No Known Allergies  Labs:  Results for orders placed or performed during the hospital encounter of 03/20/19 (from the past 48 hour(s))  CBC     Status: Abnormal   Collection Time: 03/31/19 10:02 AM  Result Value Ref Range   WBC 8.3 4.0 - 10.5 K/uL   RBC 3.77 (L) 3.87 - 5.11 MIL/uL   Hemoglobin 10.8 (L) 12.0 - 15.0 g/dL   HCT 36.6 36.0 - 46.0 %   MCV 97.1 80.0 - 100.0 fL   MCH 28.6 26.0 - 34.0 pg   MCHC 29.5 (L) 30.0 - 36.0 g/dL   RDW 12.9 11.5 - 15.5 %   Platelets 322 150 - 400 K/uL   nRBC 0.0 0.0 - 0.2 %    Comment: Performed at [facility], [place] [address] [place], [place] [address]  Basic metabolic panel     Status: Abnormal   Collection Time: 03/31/19 10:02 AM  Result Value Ref Range   Sodium 137 135 - 145 mmol/L   Potassium 4.2 3.5 - 5.1 mmol/L   Chloride 91 (L) 98 - 111 mmol/L   CO2 38 (H) 22 - 32 mmol/L   Glucose, Bld 152 (H) 70 - 99 mg/dL   BUN 10 8 - 23 mg/dL   Creatinine,  Ser 0.79 0.44 - 1.00 mg/dL   Calcium 10.4 (H) 8.9 - 10.3 mg/dL   GFR calc non Af Amer >60 >60 mL/min   GFR calc Af Amer >60 >60 mL/min   Anion gap 8 5 - 15    Comment: Performed at [facility], [place] [address], [place], [place] [address]    Current Facility-Administered Medications  Medication Dose Route Frequency Provider Last Rate Last Admin  . acetaminophen (TYLENOL) tablet 650 mg  650 mg Oral Q6H PRN Kary Kos,  Antionette PolesPeter E, NP   650 mg at 03/30/19 1414   Or  . acetaminophen (TYLENOL) suppository 650 mg  650 mg Rectal Q6H PRN Simonne MartinetBabcock, Peter E, NP      . albuterol (PROVENTIL) (2.5 MG/3ML) 0.083% nebulizer solution 2.5 mg  2.5 mg Nebulization Q4H PRN Simonne MartinetBabcock, Peter E, NP   2.5 mg at 03/31/19 [id number]  . amoxicillin-clavulanate (AUGMENTIN) 875-125 MG per tablet 1 tablet  1 tablet Oral Q12H Simonne MartinetBabcock, Peter E, NP   1 tablet at 03/31/19 1023  . busPIRone (BUSPAR) tablet 5 mg  5 mg Oral TID Simonne MartinetBabcock, Peter E, NP   5 mg at 03/31/19 1023  . chlorhexidine (PERIDEX) 0.12 % solution 15 mL  15 mL Mouth Rinse BID Alessandra BevelsKamineni, Neelima, MD   15 mL at 03/31/19 1023  . Chlorhexidine Gluconate Cloth 2 % PADS 6 each  6 each Topical Daily Alessandra BevelsKamineni, Neelima, MD   6 each at 03/29/19 1015  . diclofenac Sodium (VOLTAREN) 1 % topical gel 2 g  2 g Topical BID PRN Simonne MartinetBabcock, Peter E, NP   2 g at 03/28/19 0432  . DULoxetine (CYMBALTA) DR capsule 60 mg  60 mg Oral Daily Simonne MartinetBabcock, Peter E, NP   60 mg at 03/31/19 1023  . enoxaparin (LOVENOX) injection 80 mg  80 mg Subcutaneous QHS Simonne MartinetBabcock, Peter E, NP   80 mg at 03/30/19 2140  . fluticasone (FLONASE) 50 MCG/ACT nasal spray 1 spray  1 spray Each Nare Daily Simonne MartinetBabcock, Peter E, NP   1 spray at 03/31/19 1024  . furosemide (LASIX) tablet 20 mg  20 mg Oral Daily Alessandra BevelsKamineni, Neelima, MD   20 mg at 03/31/19 1023  . Gerhardt's butt cream   Topical BID Myrtie NeitherUgah, Nwannadiya, MD   Given at 03/31/19 1029  . hydrOXYzine (ATARAX/VISTARIL) tablet 10 mg  10 mg Oral TID PRN Alessandra BevelsKamineni, Neelima, MD    10 mg at 03/30/19 0959  . lisinopril (ZESTRIL) tablet 20 mg  20 mg Oral Daily Alessandra BevelsKamineni, Neelima, MD   20 mg at 03/31/19 1023  . MEDLINE mouth rinse  15 mL Mouth Rinse q12n4p Alessandra BevelsKamineni, Neelima, MD   15 mL at 03/30/19 1630  . multivitamin with minerals tablet 1 tablet  1 tablet Oral Daily Steffanie DunnClark, Laura P, DO   1 tablet at 03/31/19 1023    Musculoskeletal: Strength & Muscle Tone: decreased Gait & Station: did not witness Patient leans: N/A  Psychiatric Specialty Exam: Physical Exam  Nursing note and vitals reviewed. Constitutional: She is oriented to person, place, and time. She appears well-developed and well-nourished.  HENT:  Head: Normocephalic.  Respiratory: Effort normal.  Musculoskeletal:        General: Normal range of motion.     Cervical back: Normal range of motion.  Neurological: She is alert and oriented to person, place, and time.  Psychiatric: Her speech is normal and behavior is normal. Judgment normal. Her mood appears anxious. Cognition and memory are normal. She exhibits a depressed mood.    Review of Systems  Constitutional: Positive for fatigue.  Respiratory: Positive for shortness of breath.   Neurological: Positive for weakness.  Psychiatric/Behavioral: Positive for dysphoric mood. The patient is nervous/anxious.   All other systems reviewed and are negative.   Blood pressure (!) 112/56, pulse 89, temperature 98.2 F (36.8 C), temperature source Oral, resp. rate 20, height 5\' 8"  (1.727 m), weight (!) 267.2 kg, SpO2 94 %.Body mass index is 89.56 kg/m.  General Appearance: Casual  Eye Contact:  Good  Speech:  Normal Rate  Volume:  Normal  Mood:  Anxious and Depressed  Affect:  Congruent  Thought Process:  Coherent and Descriptions of Associations: Intact  Orientation:  Full (Time, Place, and Person)  Thought Content:  WDL and Logical  Suicidal Thoughts:  No  Homicidal Thoughts:  No  Memory:  Immediate;   Fair Recent;   Fair Remote;   Fair  Judgement:   Fair  Insight:  Fair  Psychomotor Activity:  Decreased  Concentration:  Concentration: Good and Attention Span: Good  Recall:  Good  Fund of Knowledge:  Fair  Language:  Good  Akathisia:  No  Handed:  Right  AIMS (if indicated):     Assets:  Leisure Time Resilience  ADL's:  Impaired  Cognition:  WNL  Sleep:      63 yo female admitted for cellulitis and AMS, reports a history of depression, anxiety, and ADHD.  Discussed medications at length and recommendations placed below.  Patient was somewhat fixated on obtaining her Xanax Adderall and Ambien.  Explained to her that psychometric testing would be needed for all and this could be facilitated outpatient, not inpatient.  Patient agreeable to follow-up outpatient.  She does not meet criteria for psychiatric hospitalization.  Treatment Plan Summary: Major depressive disorder, recurrent, mild -Continue Cymbalta 60 mg daily -Follow-up with Dr. Prentice Docker or [facility] in [place].  Anxiety: -Continue Buspar 5 mg TID -Continue hydroxyzine 10 mg TID PRN -Recommend gabapentin 100 mg BID to TID (she is use to 300 mg TID but may be too much for her at this time)  Insomnia: -Recommend Trazodone 25 mg at bedtime PRN (also assists with depression)  Disposition: No evidence of imminent risk to self or others at present.   Supportive therapy provided about ongoing stressors.  Nanine Means, NP 03/31/2019 12:12 PM

## 2019-03-31 NOTE — Progress Notes (Signed)
PROGRESS NOTE  Janice Ryan  XFG:[id number]  DOB: 1956-01-10  PCP: Cain Saupe, MDAdmit date:03/20/2019  63 year old female with history of asthma, chronic respiratory failure on 2.5 -3 L O2 at home,HTN, generalized anxiety, super morbid obesity presented with erythema and pain in bilateral lower extremities x 2 weeks. -Patient felt that her reported cellulitis was not improving and antibiotics were switched to Keflex which she had not picked up, the ED she was noted to have mild leukocytosis, started on IV Rocephin for nonpurulent cellulitis,, also had a CT chest this admission  incidentally noted possible colitis hence treated with IV Flagyl in addition to ceftriaxone,. -On 12/23 she developed acute hypercarbic respiratory failure, with CO2 narcosis and retention, she was transferred to stepdown started on BiPAP, pulmonary was consulted they discontinued all her benzodiazepines and narcotics, recommended outpatient trilogy set up, now stable on BiPAP nightly  Subjective: -Tolerated BiPAP last night, complains of pain all over, upset about narcotics being discontinued but understands the rationale, wants medications added back for anxiety   Assessment & Plan:   Acute on chronic hypoxic and hypercapnic respiratory failure:  -chronically on 2.5 to 3 L at home. Reports history of asthma but unclear basis for this diagnosis, non-smoker, required 5 to 6 L O2 during this admission -Acute respiratory failure secondary to OSA/OHS, worsened by sedation with narcotics, benzodiazepines -Requiring continuous BiPAP initially, now stable on BiPAP nightly -Previously had sleep apnea but noncompliant with CPAP, chest x-rays noted basilar atelectasis and concerns for pulmonary hypertension, echocardiogram was unfortunately very limited due to morbid obesity, the only noted that her EF was 75%, could not assess RV or PA pressures -CTA this admission showed no evidence of PE, pneumonia or CHF -Seen by  PCCM, pulmonary recommends BiPAP in the long-term and trilogy to be set up upon discharge, case management/social work following, unfortunately is uninsured which is a significant limitation -Clinically no evidence of pulmonary edema, did receive IV Lasix early in the hospital course for lower extremity edema -Discharge planning, social work/case management following, working on setting up charity home health, also needs home trilogy for BiPAP dependent respiratory failure  Cellulitis vs reactive lymphangitis of both lower extremities with underlying chronic venous stasis: -Patient had erythema of both lower extremities, up to knees, also venous stasis-Doppler ultrasound of the lower extremities negative for DVT-Much improved, started on IV Rocephin  upon admission, also received Lasix in ED. White count was 10.9 on presentation, peaked to 16.7 and now resolved, afebrile  CT abnormality suggestive of pancreatic/colonic inflammation: CT chest incidentally reported new inflammatory stranding adjacent to tail of pancreas and splenic flexure suggestive of possible colitis diverticulitis or pancreatitis. Although Lipase elevated at 189, no acute abdominal complaints, nausea vomiting or diarrhea--findings could be related  To hypercalcemia or HCTZ that patient takes at home.  -Abdominal symptoms, tolerating diet  -Briefly received IV ceftriaxone and Flagyl, subsequently transitioned to Augmentin which was discontinued 12/26  Mild hypercalcemia- Likely related to chronicThiazide use and immobility. Calcium 11.3 noted at the time of admission,currently improved to 10.6 after d/ced HCTZ. Ionized calcium slightly up at 6, PTH levels wnl.No evidence of lung malignancy on CT chest.   Mediastinal nodule/cyst::Prior CTA chest 07/28/2018 showed a 2.8 x 2.2 cm nodule in the anterior mediastinum. It was recommended to have follow-up chest CT in 3-6 months or further characterization with PET CT. Unclear follow-up  and imaging was pursued. CT chest done this admission shows well-circumscribed solid nodule versus complex cyst in the anterior mediastinum, stable for  last 8 months. Follow-up in 12 months recommended..  AMS/Hypersomnolence: Metabolic encephalopathy in the setting of OSA/CO2 narcosis exacerbated by benzo/opiate use.  Pulmonary recommends avoiding Xanax and opiates-they have discontinued these meds.  Added hydroxyzine for as needed use for anxiety.   Depression, anxiety -Continued on BuSpar and Cymbalta, patient requests additional SSRI/SNRI for anxiety, Xanax discontinued due to hypercarbia and respiratory failure, psychiatry consulted for medication optimization with nonsedating options if possible  Right knee tricompartment departmental osteoarthritis -Longstanding ongoing issue -Continue local Voltaren gel, avoid opiates -Physical therapy, unfortunately given super morbid obesity doubt she would be considered a candidate for knee replacement  Hypertension-Currently fluctuating with SBP 130-150, continue lisinopril--increased dose to 20mg -watch potassium  Chronic leg edema/venous stasis- Currently stable,  Resume home diuretics-lasix 20mg   Generalized anxiety disorder:Continue BuSpar and Cymbalta.  Super Morbid obesity:BMI 89, counseled on diet and weight control   DVT prophylaxis: lovenox Code Status: Full  Family / Patient Communication: Discussed with patient at bedside  Disposition Plan: To be determined, PT recommends SNF however she is uninsured with limited options, also needs home trilogy set up  Objective: Vitals:   03/30/19 1516 03/30/19 2335 03/31/19 0226 03/31/19 0651  BP:   (!) 120/58 (!) 112/56  Pulse:  87  89  Resp:  16  20  Temp:  97.7 F (36.5 C)  98.2 F (36.8 C)  TempSrc:  Axillary  Oral  SpO2: 95% 93%  94%  Weight:    (!) 267.2 kg  Height:        Intake/Output Summary (Last 24 hours) at 03/31/2019 1301 Last data filed at 03/31/2019 0855 Gross per  24 hour  Intake 600 ml  Output 1550 ml  Net -950 ml   Filed Weights   03/28/19 0410 03/30/19 1400 03/31/19 0651  Weight: (!) 231 kg (!) 264.9 kg (!) 267.2 kg    Physical Examination: Gen: Extremely obese female sitting up in bed, AAO x3, no distress HEENT: Neck obese, unable to assess JVD Lungs: Distant breath sounds CVS: Distant heart sounds Abd: Obese soft with large pannus , nontender , umbilical hernia noted  extremities: No edema, chronic venous stasis changes noted Skin: As above Psychiatry: Judgement and insight appear normal.   Data Reviewed: I have personally reviewed following labs and imaging studies  CBC: Recent Labs  Lab 03/26/19 1520 03/27/19 0146 03/31/19 1002  WBC 10.0 9.4 8.3  HGB 11.0* 10.9* 10.8*  HCT 37.3 38.3 36.6  MCV 98.4 99.5 97.1  PLT 288 290 322   Basic Metabolic Panel: Recent Labs  Lab 03/26/19 1520 03/27/19 1214 03/29/19 0547 03/31/19 1002  NA 139  --  140 137  K 4.0  --  4.0 4.2  CL 89*  --  89* 91*  CO2 42*  --  42* 38*  GLUCOSE 99  --  106* 152*  BUN 13  --  10 10  CREATININE 0.70  --  0.63 0.79  CALCIUM 10.6* 10.8* 10.6* 10.4*   GFR: Estimated Creatinine Clearance: 169.3 mL/min (by C-G formula based on SCr of 0.79 mg/dL). Liver Function Tests: No results for input(s): AST, ALT, ALKPHOS, BILITOT, PROT, ALBUMIN in the last 168 hours. No results for input(s): LIPASE, AMYLASE in the last 168 hours. No results for input(s): AMMONIA in the last 168 hours. Coagulation Profile: No results for input(s): INR, PROTIME in the last 168 hours. Cardiac Enzymes: No results for input(s): CKTOTAL, CKMB, CKMBINDEX, TROPONINI in the last 168 hours. BNP (last 3 results) No results for input(s):  PROBNP in the last 8760 hours. HbA1C: No results for input(s): HGBA1C in the last 72 hours. CBG: No results for input(s): GLUCAP in the last 168 hours. Lipid Profile: No results for input(s): CHOL, HDL, LDLCALC, TRIG, CHOLHDL, LDLDIRECT in the last  72 hours. Thyroid Function Tests: No results for input(s): TSH, T4TOTAL, FREET4, T3FREE, THYROIDAB in the last 72 hours. Anemia Panel: No results for input(s): VITAMINB12, FOLATE, FERRITIN, TIBC, IRON, RETICCTPCT in the last 72 hours. Sepsis Labs: No results for input(s): PROCALCITON, LATICACIDVEN in the last 168 hours.  Recent Results (from the past 240 hour(s))  MRSA PCR Screening     Status: None   Collection Time: 03/26/19  2:11 PM   Specimen: Nasal Mucosa; Nasopharyngeal  Result Value Ref Range Status   MRSA by PCR NEGATIVE NEGATIVE Final    Comment:        The GeneXpert MRSA Assay (FDA approved for NASAL specimens only), is one component of a comprehensive MRSA colonization surveillance program. It is not intended to diagnose MRSA infection nor to guide or monitor treatment for MRSA infections. Performed at [facility], [place] [address], [place], [place] [address]       Radiology Studies: DG Knee 1-2 Views Right  Result Date: 03/30/2019 CLINICAL DATA:  Right knee pain. EXAM: RIGHT KNEE - 1-2 VIEW COMPARISON:  03/21/2019 FINDINGS: Advanced degenerative changes are again noted, particularly in the medial and patellofemoral compartments of the knee. The knee is located. No acute or healing fracture is present. There is no significant joint effusion. IMPRESSION: 1. Stable appearance of severe degenerative changes in the medial and patellofemoral compartments of the knee. 2. No acute or healing fracture. Electronically Signed   By: San Morelle M.D.   On: 03/30/2019 19:14        Scheduled Meds: . amoxicillin-clavulanate  1 tablet Oral Q12H  . busPIRone  5 mg Oral TID  . chlorhexidine  15 mL Mouth Rinse BID  . Chlorhexidine Gluconate Cloth  6 each Topical Daily  . DULoxetine  60 mg Oral Daily  . enoxaparin (LOVENOX) injection  80 mg Subcutaneous QHS  . fluticasone  1 spray Each Nare Daily  . furosemide  20 mg Oral Daily  . Gerhardt's butt  cream   Topical BID  . lisinopril  20 mg Oral Daily  . mouth rinse  15 mL Mouth Rinse q12n4p  . multivitamin with minerals  1 tablet Oral Daily   Continuous Infusions:    LOS: 10 days    Time spent: 35 minutes  Domenic Polite, MD [facility]  03/31/2019, 1:01 PM

## 2019-03-31 NOTE — Plan of Care (Signed)
  Problem: Clinical Measurements: Goal: Ability to avoid or minimize complications of infection will improve Outcome: Progressing   Problem: Health Behavior/Discharge Planning: Goal: Ability to manage health-related needs will improve Outcome: Progressing   Problem: Activity: Goal: Risk for activity intolerance will decrease Outcome: Progressing   Problem: Education: Goal: Knowledge of General Education information will improve Description: Including pain rating scale, medication(s)/side effects and non-pharmacologic comfort measures Outcome: Completed/Met

## 2019-03-31 NOTE — TOC Progression Note (Signed)
Transition of Care [facility]) - Progression Note    Patient Details  Name: Janice Ryan MRN: [id number] Date of Birth: 04-14-1955  Transition of Care [facility]) CM/SW [place], LCSW Phone Number: 03/31/2019, 5:07 PM  Clinical Narrative:   CSW met patient at bedside to discuss discharge plans. Patient want to go home with family. Patient agreeable to have BiPAP in the home. CSW will find resources to assist patient in obtaining a BiPAP at discharge. Patient stated she has some money she would be able to use for her medical needs. CSW will continue to follow for support     Expected Discharge Plan: Home/Self Care Barriers to Discharge: Continued Medical Work up  Expected Discharge Plan and Services Expected Discharge Plan: Home/Self Care   Discharge Planning Services: CM Consult   Living arrangements for the past 2 months: Single Family Home                                       Social Determinants of Health (SDOH) Interventions    Readmission Risk Interventions Readmission Risk Prevention Plan 07/31/2018  Post Dischage Appt Complete  Medication Screening Complete  Transportation Screening Complete  Some recent data might be hidden

## 2019-03-31 NOTE — Telephone Encounter (Signed)
-----   Message from Randa Spike, [place] sent at 03/31/2019 10:10 AM EST ----- Regarding: FW: needs sleep doc  ----- Message ----- From: Erick Colace, NP Sent: 03/27/2019  10:03 AM EST To: [facility] Triage Pool Subject: needs sleep doc                                Hey Team,  Need you guys to find this lady an appointment w/ one of our sleep doctors. She should be leaving the hospital in a week or so.. I guess lets look for a spot in ~3-4 weeks.  Should be a 30 minutes consult spot for sleep  Working dx Chronic respiratory failure Obesity hypoventilation  OSA  NIPPV started in hospital and needs follow up   pete

## 2019-04-01 LAB — BASIC METABOLIC PANEL
Anion gap: 9 (ref 5–15)
BUN: 11 mg/dL (ref 8–23)
CO2: 35 mmol/L — ABNORMAL HIGH (ref 22–32)
Calcium: 10.6 mg/dL — ABNORMAL HIGH (ref 8.9–10.3)
Chloride: 94 mmol/L — ABNORMAL LOW (ref 98–111)
Creatinine, Ser: 0.78 mg/dL (ref 0.44–1.00)
GFR calc Af Amer: >60 mL/min (ref >60)
GFR calc non Af Amer: >60 mL/min (ref >60)
Glucose, Bld: 122 mg/dL — ABNORMAL HIGH (ref 70–99)
Potassium: 4.3 mmol/L (ref 3.5–5.1)
Sodium: 138 mmol/L (ref 135–145)

## 2019-04-01 MED ORDER — TRAZODONE HCL 50 MG PO TABS
50.0000 mg | ORAL_TABLET | Freq: Every evening | ORAL | Status: DC | PRN
  Administered 2019-04-01: 50 mg via ORAL
  Filled 2019-04-01: qty 1

## 2019-04-01 MED ORDER — GERHARDT'S BUTT CREAM
TOPICAL_CREAM | Freq: Two times a day (BID) | CUTANEOUS | Status: DC
  Filled 2019-04-01: qty 1

## 2019-04-01 MED ORDER — GABAPENTIN 100 MG PO CAPS
100.0000 mg | ORAL_CAPSULE | Freq: Three times a day (TID) | ORAL | Status: DC
  Administered 2019-04-01 – 2019-04-02 (×4): 100 mg via ORAL
  Filled 2019-04-01 (×4): qty 1

## 2019-04-01 NOTE — TOC Progression Note (Signed)
Transition of Care [facility]) - Progression Note    Patient Details  Name: Janice Ryan MRN: [id number] Date of Birth: 1955/11/29  Transition of Care [facility]) CM/SW [place], LCSW Phone Number: 04/01/2019, 5:12 PM  Clinical Narrative:   CSW met patient at bedside to discuss discharge plans. Patient stated she lives at home with her husband and son. Patient stated she would like to go home with [place]. Patient stated she is agreeable to rent a BiPAP from [place]. CSW went over the price with patient and she stated she would be able to afford that. Patient stated she will also schedule a sleep study through [facility]. CSW contacted [employer] and gave them patients information so they will be able to follow patient after discharge to assist with renting a BiPAP machine. CSW made MD aware of plans      Expected Discharge Plan: Home/Self Care Barriers to Discharge: Continued Medical Work up  Expected Discharge Plan and Services Expected Discharge Plan: Home/Self Care   Discharge Planning Services: CM Consult   Living arrangements for the past 2 months: Single Family Home                                       Social Determinants of Health (SDOH) Interventions    Readmission Risk Interventions Readmission Risk Prevention Plan 07/31/2018  Post Dischage Appt Complete  Medication Screening Complete  Transportation Screening Complete  Some recent data might be hidden

## 2019-04-01 NOTE — Progress Notes (Signed)
PROGRESS NOTE  Tamala JulianDonna Jeffries-Boykin  ZOX:[id number]  DOB: 06/01/1955  PCP: Cain SaupeFulp, Cammie, MDAdmit date:03/20/2019  63 year old female with history of asthma, chronic respiratory failure on 2.5 -3 L O2 at home,HTN, generalized anxiety, super morbid obesity presented with erythema and pain in bilateral lower extremities x 2 weeks. -Patient felt that her reported cellulitis was not improving and antibiotics were switched to Keflex which she had not picked up, the ED she was noted to have mild leukocytosis, started on IV Rocephin for nonpurulent cellulitis,, also had a CT chest this admission  incidentally noted possible colitis hence treated with IV Flagyl in addition to ceftriaxone,. -On 12/23 she developed acute hypercarbic respiratory failure, with CO2 narcosis and retention, she was transferred to stepdown started on BiPAP, pulmonary was consulted they discontinued all her benzodiazepines and narcotics, recommended outpatient trilogy set up, now stable on BiPAP nightly  Subjective: -Spoke to psych yesterday about medications -Working with Interior and spatial designersocial work/case management, arranging home BiPAP/trilogy continues to be extremely challenging as patient is uninsured and without means   Assessment & Plan:   Acute on chronic hypoxic and hypercapnic respiratory failure:  -chronically on 2.5 to 3 L at home. Reports history of asthma but unclear basis for this diagnosis, non-smoker, required 5 to 6 L O2 during this admission -During this hospitalization developed acute hypercarbic respiratory failure secondary to OSA/OHS, worsened by sedation with narcotics, benzodiazepines -Requiring continuous BiPAP initially, now stable on BiPAP nightly -Previously had sleep apnea but noncompliant with CPAP, chest x-rays noted basilar atelectasis and concerns for pulmonary hypertension, echocardiogram was unfortunately very limited due to morbid obesity, the only noted that her EF was 75%, could not assess RV or PA  pressures -CTA this admission showed no evidence of PE, pneumonia or CHF -Seen by PCCM, pulmonary recommends BiPAP in the long-term and trilogy to be set up upon discharge, case management/social work following, unfortunately is uninsured which is a significant limitation -Clinically no evidence of pulmonary edema, did receive IV Lasix early in the hospital course for lower extremity edema -Discharge planning, social work/case management following, working on setting up charity home health, also needs home trilogy for BiPAP dependent respiratory failure-options for this unknown at this time  Cellulitis vs reactive lymphangitis of both lower extremities with underlying chronic venous stasis: -Patient had erythema of both lower extremities, up to knees, also venous stasis-Doppler ultrasound of the lower extremities negative for DVT-Much improved, started on IV Rocephin  upon admission, also received Lasix in ED. White count was 10.9 on presentation, peaked to 16.7 and now resolved, afebrile -Off antibiotics now  CT abnormality suggestive of pancreatic/colonic inflammation: CT chest incidentally reported new inflammatory stranding adjacent to tail of pancreas and splenic flexure suggestive of possible colitis diverticulitis or pancreatitis. Although Lipase elevated at 189, no acute abdominal complaints, nausea vomiting or diarrhea--findings could be related  To hypercalcemia or HCTZ that patient takes at home.  -Abdominal symptoms, tolerating diet  -Briefly received IV ceftriaxone and Flagyl, subsequently transitioned to Augmentin which was discontinued 12/26  Mild hypercalcemia- Likely related to chronicThiazide use and immobility. Calcium 11.3 noted at the time of admission,currently improved to 10.6 after d/ced HCTZ. Ionized calcium slightly up at 6, PTH levels wnl.No evidence of lung malignancy on CT chest.   Mediastinal nodule/cyst::Prior CTA chest 07/28/2018 showed a 2.8 x 2.2 cm nodule in the  anterior mediastinum. It was recommended to have follow-up chest CT in 3-6 months or further characterization with PET CT. Unclear follow-up and imaging was pursued. CT chest  done this admission shows well-circumscribed solid nodule versus complex cyst in the anterior mediastinum, stable for last 8 months. Follow-up in 12 months recommended..  AMS/Hypersomnolence: Metabolic encephalopathy in the setting of OSA/CO2 narcosis exacerbated by benzo/opiate use.  Pulmonary recommends avoiding Xanax and opiates-they have discontinued these meds.  -Added hydroxyzine for as needed use for anxiety.   Depression, anxiety -Continued on BuSpar and Cymbalta, patient requests additional SSRI/SNRI for anxiety, Xanax discontinued due to hypercarbia and respiratory failure, psychiatry consulted for medication optimization with nonsedating options, recommended  gabapentin  100mg   3 times daily and trazodone nightly  Right knee tricompartment departmental osteoarthritis -Longstanding ongoing issue -Continue local Voltaren gel, avoid opiates -Physical therapy, unfortunately given super morbid obesity doubt she would be considered a candidate for knee replacement  Hypertension-Currently fluctuating with SBP 130-150, continue lisinopril--increased dose to 20mg   Chronic leg edema/venous stasis- Currently stable,  -Continue Lasix 20 mg daily  Generalized anxiety disorder:Continue BuSpar and Cymbalta.  Super Morbid obesity:BMI 89, counseled on diet and weight control   DVT prophylaxis: lovenox Code Status: Full  Family / Patient Communication: Discussed with patient at bedside  Disposition Plan: To be determined, PT recommends SNF however she is uninsured with limited options, also needs home trilogy set up-to be determined, CM working on this  Objective: Vitals:   03/31/19 0651 03/31/19 1339 03/31/19 2137 04/01/19 0603  BP: (!) 112/56 123/77 (!) 147/66 127/89  Pulse: 89 93 90 89  Resp: 20 19 19 20     Temp: 98.2 F (36.8 C) 98.2 F (36.8 C) 98.2 F (36.8 C) 97.9 F (36.6 C)  TempSrc: Oral Oral Oral Oral  SpO2: 94% 94% 93% 94%  Weight: (!) 267.2 kg     Height:        Intake/Output Summary (Last 24 hours) at 04/01/2019 1258 Last data filed at 04/01/2019 0900 Gross per 24 hour  Intake 960 ml  Output 700 ml  Net 260 ml   Filed Weights   03/28/19 0410 03/30/19 1400 03/31/19 0651  Weight: (!) 231 kg (!) 264.9 kg (!) 267.2 kg    Physical Examination: Gen: Morbidly obese female, laying in bed, AAOx3,  HEENT: PERRLA, Neck supple, no JVD Lungs: Decreased breath sounds CVS: RRR,No Gallops,Rubs or new Murmurs Abd: soft, Non tender, non distended, BS present Extremities: No edema, chronic venous stasis changes Skin: as above Psychiatry: Judgement and insight appear normal.   Data Reviewed: I have personally reviewed following labs and imaging studies  CBC: Recent Labs  Lab 03/26/19 1520 03/27/19 0146 03/31/19 1002  WBC 10.0 9.4 8.3  HGB 11.0* 10.9* 10.8*  HCT 37.3 38.3 36.6  MCV 98.4 99.5 97.1  PLT 288 290 235   Basic Metabolic Panel: Recent Labs  Lab 03/26/19 1520 03/27/19 1214 03/29/19 0547 03/31/19 1002 04/01/19 0539  NA 139  --  140 137 138  K 4.0  --  4.0 4.2 4.3  CL 89*  --  89* 91* 94*  CO2 42*  --  42* 38* 35*  GLUCOSE 99  --  106* 152* 122*  BUN 13  --  10 10 11   CREATININE 0.70  --  0.63 0.79 0.78  CALCIUM 10.6* 10.8* 10.6* 10.4* 10.6*   GFR: Estimated Creatinine Clearance: 169.3 mL/min (by C-G formula based on SCr of 0.78 mg/dL). Liver Function Tests: No results for input(s): AST, ALT, ALKPHOS, BILITOT, PROT, ALBUMIN in the last 168 hours. No results for input(s): LIPASE, AMYLASE in the last 168 hours. No results for input(s): AMMONIA  in the last 168 hours. Coagulation Profile: No results for input(s): INR, PROTIME in the last 168 hours. Cardiac Enzymes: No results for input(s): CKTOTAL, CKMB, CKMBINDEX, TROPONINI in the last 168 hours. BNP  (last 3 results) No results for input(s): PROBNP in the last 8760 hours. HbA1C: No results for input(s): HGBA1C in the last 72 hours. CBG: No results for input(s): GLUCAP in the last 168 hours. Lipid Profile: No results for input(s): CHOL, HDL, LDLCALC, TRIG, CHOLHDL, LDLDIRECT in the last 72 hours. Thyroid Function Tests: No results for input(s): TSH, T4TOTAL, FREET4, T3FREE, THYROIDAB in the last 72 hours. Anemia Panel: No results for input(s): VITAMINB12, FOLATE, FERRITIN, TIBC, IRON, RETICCTPCT in the last 72 hours. Sepsis Labs: No results for input(s): PROCALCITON, LATICACIDVEN in the last 168 hours.  Recent Results (from the past 240 hour(s))  MRSA PCR Screening     Status: None   Collection Time: 03/26/19  2:11 PM   Specimen: Nasal Mucosa; Nasopharyngeal  Result Value Ref Range Status   MRSA by PCR NEGATIVE NEGATIVE Final    Comment:        The GeneXpert MRSA Assay (FDA approved for NASAL specimens only), is one component of a comprehensive MRSA colonization surveillance program. It is not intended to diagnose MRSA infection nor to guide or monitor treatment for MRSA infections. Performed at [facility], [address] [place], [place] [address]       Radiology Studies: DG Knee 1-2 Views Right  Result Date: 03/30/2019 CLINICAL DATA:  Right knee pain. EXAM: RIGHT KNEE - 1-2 VIEW COMPARISON:  03/21/2019 FINDINGS: Advanced degenerative changes are again noted, particularly in the medial and patellofemoral compartments of the knee. The knee is located. No acute or healing fracture is present. There is no significant joint effusion. IMPRESSION: 1. Stable appearance of severe degenerative changes in the medial and patellofemoral compartments of the knee. 2. No acute or healing fracture. Electronically Signed   By: Marin Roberts M.D.   On: 03/30/2019 19:14        Scheduled Meds: . busPIRone  5 mg Oral TID  . chlorhexidine  15 mL Mouth Rinse  BID  . Chlorhexidine Gluconate Cloth  6 each Topical Daily  . DULoxetine  60 mg Oral Daily  . enoxaparin (LOVENOX) injection  80 mg Subcutaneous QHS  . fluticasone  1 spray Each Nare Daily  . furosemide  20 mg Oral Daily  . Gerhardt's butt cream   Topical BID  . lisinopril  20 mg Oral Daily  . mouth rinse  15 mL Mouth Rinse q12n4p  . multivitamin with minerals  1 tablet Oral Daily   Continuous Infusions:    LOS: 11 days    Time spent: 25 minutes  Zannie Cove, MD [facility]  04/01/2019, 12:58 PM

## 2019-04-02 MED ORDER — TRAZODONE HCL 50 MG PO TABS: 50.0000 mg | ORAL_TABLET | Freq: Every evening | ORAL | 0 refills | Status: DC | PRN

## 2019-04-02 MED ORDER — GABAPENTIN 100 MG PO CAPS: 100.0000 mg | ORAL_CAPSULE | Freq: Three times a day (TID) | ORAL | 0 refills | Status: DC

## 2019-04-02 MED ORDER — BUSPIRONE HCL 5 MG PO TABS: 5.0000 mg | ORAL_TABLET | Freq: Three times a day (TID) | ORAL | 0 refills | Status: DC

## 2019-04-02 MED ORDER — LISINOPRIL 20 MG PO TABS: 20.0000 mg | ORAL_TABLET | Freq: Every day | ORAL | 1 refills | Status: DC

## 2019-04-02 MED ORDER — DICLOFENAC SODIUM 1 % EX GEL: 2.0000 g | Freq: Four times a day (QID) | CUTANEOUS | 0 refills | Status: DC | PRN

## 2019-04-02 NOTE — Progress Notes (Signed)
Pt discharged home today per Dr. Tawanna Solo. Pt's IV site D/C'd and WDL. Pt's VSS. Pt provided with home medication list, discharge instructions and prescriptions. Verbalized understanding. Pt currently awaiting arrival of [facility] for transport home.

## 2019-04-02 NOTE — Telephone Encounter (Signed)
Patient still admitted to the hospital as of time stamp.

## 2019-04-02 NOTE — Progress Notes (Signed)
[facility] transporting pt off floor.

## 2019-04-02 NOTE — Plan of Care (Signed)
  Problem: Clinical Measurements: Goal: Ability to avoid or minimize complications of infection will improve Outcome: Completed/Met   Problem: Skin Integrity: Goal: Skin integrity will improve Outcome: Completed/Met   Problem: Health Behavior/Discharge Planning: Goal: Ability to manage health-related needs will improve 04/02/2019 1603 by Annie Sable, RN Outcome: Completed/Met 04/02/2019 0949 by Annie Sable, RN Outcome: Progressing   Problem: Activity: Goal: Risk for activity intolerance will decrease Outcome: Completed/Met   Problem: Nutrition: Goal: Adequate nutrition will be maintained Outcome: Completed/Met   Problem: Coping: Goal: Level of anxiety will decrease Outcome: Completed/Met   Problem: Elimination: Goal: Will not experience complications related to bowel motility Outcome: Completed/Met Goal: Will not experience complications related to urinary retention Outcome: Completed/Met   Problem: Pain Managment: Goal: General experience of comfort will improve Outcome: Completed/Met   Problem: Safety: Goal: Ability to remain free from injury will improve Outcome: Completed/Met

## 2019-04-02 NOTE — Progress Notes (Signed)
[facility] consulted for IV access. Upon arrival to bedside, pt's nurse advised pt might be discharged today. Nurse to cancel consult and place new consult if IV needed at later time.

## 2019-04-02 NOTE — Discharge Summary (Signed)
Physician Discharge Summary  Janice Ryan ZOX:[id number] DOB: [birth year] DOA: 03/20/2019  PCP: Cain Saupe, MD  Admit date: 03/20/2019 Discharge date: 04/02/2019  Admitted From: Home Disposition:  Home  Discharge Condition:Stable CODE STATUS:FULL Diet recommendation: Heart Healthy  Brief/Interim Summary:  Patient is a 63 year old female with history of asthma, chronic respiratory failure on 2.5-3 LO2at home,HTN, generalized anxiety, super morbid obesity presented with erythema and pain inbilaterallower extremities x 2 weeks. -Patient felt that her reported cellulitis was not improving and antibiotics wereswitched to Keflex which she hadnot picked up, the ED she was noted to have mild leukocytosis, started on IV Rocephin for nonpurulent cellulitis, also had a CT chest this admission  incidentally noted possible colitis hence treated with IV Flagyl in addition to ceftriaxone,. -On 12/23 she developed acute hypercarbic respiratory failure, with CO2 narcosis and retention, she was transferred to stepdown started on BiPAP, pulmonary was consulted they discontinued all her benzodiazepines and narcotics, recommended outpatient trilogy set up, now stable on BiPAP nightly. Arranging home BiPAP/trilogy continues to be extremely challenging as patient is uninsured and without means.Patient stated she is agreeable to rent a BiPAP from [facility]. CSW went over the price with patient and she stated she would be able to afford that. Patient stated she will also schedule a sleep study through [facility]. She is hemodynamically stable for discharge to home today.  We recommend to follow-up with her PCP in a week and also with pulmonology in 2 weeks.  Following problems were addressed during her hospitalization:  Acute on chronichypoxic and hypercapnicrespiratory failure: -chronically on 2.5 to 3 L at home. Reports history of asthma but unclear basis for this diagnosis, non-smoker,  required 5 to 6 L O2 during this admission -During this hospitalization developed acute hypercarbic respiratory failure secondary to OSA/OHS, worsened by sedation with narcotics, benzodiazepines -Requiring continuous BiPAP initially, now stable on BiPAP nightly -Previously had sleep apnea but noncompliant with CPAP, chest x-rays noted basilar atelectasis and concerns for pulmonary hypertension, echocardiogram was unfortunately very limited due to morbid obesity, the only noted that her EF was 75%, could not assess RV or PA pressures -CTA this admission showed no evidence of PE, pneumonia or CHF -Seen by PCCM, pulmonary recommends BiPAP in the long-term and trilogy to be set up upon discharge, case management/social work following, unfortunately is uninsured which is a significant limitation -Clinically no evidence of pulmonary edema, did receive IV Lasix early in the hospital course for lower extremity edema -Discharge planning, social work/case management following, working on setting up charity home health.  Cellulitisvs reactive lymphangitisof both lower extremitieswith underlying chronic venous stasis:-Patient had erythema of both lower extremities, up to knees, also venous stasis-Doppler ultrasound of the lower extremities negative for DVT-Much improved, started on IV Rocephin upon admission, also received Lasixin ED.White count was 10.9 on presentation, peaked to 16.7 and now resolved, afebrile -Off antibiotics now  CT abnormality suggestive ofpancreatic/colonic inflammation: CT chest incidentally reportednew inflammatory stranding adjacent to tail of pancreas and splenic flexuresuggestive ofpossible colitis diverticulitis or pancreatitis. AlthoughLipase elevatedat189,no acute abdominal complaints, nausea vomiting or diarrhea--findings could be related To hypercalcemia or HCTZ that patient takes at home.  -Abdominal symptoms, tolerating diet  -Briefly received IV ceftriaxone  and Flagyl, subsequently transitioned to Augmentin which was discontinued 12/26  Mild hypercalcemia-Likely related to chronicThiazide use and immobility.Calcium 11.3 noted at the time of admission,currently improved to 10.6after d/ced HCTZ.Ionized calcium slightly up at 6, PTH levels wnl.No evidence of lung malignancy on CT chest.   Mediastinal  nodule/cyst::Prior CTA chest 07/28/2018 showed a 2.8 x 2.2 cm nodule in the anterior mediastinum. It was recommended to have follow-up chest CT in 3-6 months or further characterization with PET CT. Unclear follow-up and imaging was pursued.CT chest done this admission shows well-circumscribed solid nodule versus complex cyst in the anterior mediastinum, stable for last 8 months. Follow-up in 12 months recommended..  AMS/Hypersomnolence: Metabolic encephalopathy in the setting of OSA/CO2 narcosis exacerbated by benzo/opiate use.  Pulmonary recommends avoiding Xanax and opiates-they have discontinued these meds.   Depression, anxiety -Continued on BuSpar and Cymbalta, patient requests additional SSRI/SNRI for anxiety, Xanax discontinued due to hypercarbia and respiratory failure, psychiatry consulted for medication optimization with nonsedating options, recommended  gabapentin    3 times daily and trazodone nightly  Right knee tricompartment departmental osteoarthritis -Longstanding ongoing issue -Continue local Voltaren gel, avoid opiates -Physical therapy, unfortunately given super morbid obesity doubt she would be considered a candidate for knee replacement  Hypertension-Currently fluctuating with SBP 130-150, continue lisinopril--increased dose to   Chronic leg edema/venous stasis- Currently stable,  Continue Lasix 20 mg daily  Generalized anxiety disorder:Continue BuSpar and Cymbalta.  Super Morbid obesity:BMI 89, counseled on diet and weight control  Discharge Diagnoses:  Principal Problem:   Cellulitis of both lower  extremities Active Problems:   Chronic respiratory failure with hypoxia (HCC)   HTN (hypertension)   Anxiety state   Cellulitis   Major depressive disorder, recurrent episode, mild (HCC)    Discharge Instructions  Discharge Instructions    Ambulatory referral to Pulmonology   Complete by: As directed    Diet - low sodium heart healthy   Complete by: As directed    Discharge instructions   Complete by: As directed    1) Please take prescribed medications as instructed. 2)Undergo sleep study as an outpatient.  3)Make an appointment with pulmonology as an outpatient in 2 weeks.  Name and number the provider has been attached.  Please call to make an appointment 4)Follow up with your PCP in a week.   Increase activity slowly   Complete by: As directed      Allergies as of 04/02/2019   No Known Allergies     Medication List    STOP taking these medications   ALPRAZolam 0.5 MG tablet Commonly known as: XANAX   cephALEXin 500 MG capsule Commonly known as: KEFLEX   lisinopril-hydrochlorothiazide 10-12.5 MG tablet Commonly known as: ZESTORETIC   traMADol 50 MG tablet Commonly known as: ULTRAM     TAKE these medications   albuterol (2.5 MG/3ML) 0.083% nebulizer solution Commonly known as: PROVENTIL Take 3 mLs (2.5 mg total) by nebulization every 6 (six) hours as needed for wheezing or shortness of breath.   albuterol 108 (90 Base) MCG/ACT inhaler Commonly known as: VENTOLIN HFA Inhale 2 puffs into the lungs every 6 (six) hours as needed for wheezing or shortness of breath.   busPIRone 5 MG tablet Commonly known as: BUSPAR Take 1 tablet (5 mg total) by mouth 3 (three) times daily.   diclofenac Sodium 1 % Gel Commonly known as: Voltaren Apply 2 g topically 4 (four) times daily as needed.   DULoxetine 60 MG capsule Commonly known as: CYMBALTA TAKE ONE CAPSULE BY MOUTH DAILY   Fluticasone-Salmeterol 500-50 MCG/DOSE Aepb Commonly known as: ADVAIR Inhale 1 puff  into the lungs 2 (two) times daily.   furosemide 20 MG tablet Commonly known as: LASIX Take 1 tablet (20 mg total) by mouth daily.   gabapentin 100 MG  capsule Commonly known as: NEURONTIN Take 1 capsule (100 mg total) by mouth 3 (three) times daily. What changed:   medication strength  how much to take   IRON PO Take 1 tablet by mouth every other day.   lisinopril 20 MG tablet Commonly known as: ZESTRIL Take 1 tablet (20 mg total) by mouth daily. Start taking on: April 03, 2019   methocarbamol 500 MG tablet Commonly known as: Robaxin Take 1 tablet (500 mg total) by mouth 2 (two) times daily.   montelukast 10 MG tablet Commonly known as: SINGULAIR TAKE ONE TABLET BY MOUTH EVERY NIGHT AT BEDTIME   multivitamin with minerals Tabs tablet Take 1 tablet by mouth daily.   OXYGEN Inhale into the lungs at bedtime as needed (2 L).   traZODone 50 MG tablet Commonly known as: DESYREL Take 1 tablet (50 mg total) by mouth at bedtime as needed for sleep.   VITAMIN D3 PO Take 1 tablet by mouth daily.            Durable Medical Equipment  (From admission, onward)         Start     Ordered   04/01/19 1742  For home use only DME Bipap  Once    Question Answer Comment  Length of Need Lifetime   Inspiratory pressure 18   Expiratory pressure 8      04/01/19 1741         Follow-up Information    Fulp, Cammie, MD. Schedule an appointment as soon as possible for a visit in 1 week(s).   Specialty: Family Medicine Contact information: [place] [address] [phone]        Rigoberto Noel, MD. Schedule an appointment as soon as possible for a visit in 2 week(s).   Specialty: Pulmonary Disease Contact information: [place] [address] [phone]          No Known Allergies  Consultations: PCCM, psychiatry  Procedures/Studies: DG Knee 1-2 Views Right  Result Date: 03/30/2019 CLINICAL DATA:  Right  knee pain. EXAM: RIGHT KNEE - 1-2 VIEW COMPARISON:  03/21/2019 FINDINGS: Advanced degenerative changes are again noted, particularly in the medial and patellofemoral compartments of the knee. The knee is located. No acute or healing fracture is present. There is no significant joint effusion. IMPRESSION: 1. Stable appearance of severe degenerative changes in the medial and patellofemoral compartments of the knee. 2. No acute or healing fracture. Electronically Signed   By: San Morelle M.D.   On: 03/30/2019 19:14   CT ANGIO CHEST PE W OR WO CONTRAST  Result Date: 03/22/2019 CLINICAL DATA:  Shortness of breath, asthma, chronic respiratory failure and lower extremity cellulitis. EXAM: CT ANGIOGRAPHY CHEST WITH CONTRAST TECHNIQUE: Multidetector CT imaging of the chest was performed using the standard protocol during bolus administration of intravenous contrast. Multiplanar CT image reconstructions and MIPs were obtained to evaluate the vascular anatomy. CONTRAST:  132mL OMNIPAQUE IOHEXOL 350 MG/ML SOLN COMPARISON:  Prior CTA of the chest on 07/28/2018 FINDINGS: Cardiovascular: Body habitus and some respiratory motion again limits pulmonary arterial evaluation beyond the segmental level. No significant pulmonary embolism identified. Pulmonary arteries are normal in caliber. The heart size is normal. No pericardial fluid identified. The thoracic aorta is normal in caliber. No significant calcified plaque identified in the distribution of the coronary arteries. Mediastinum/Nodes: Stable well-circumscribed solid nodule versus complex cyst in the anterior mediastinum immediately anterior to the main pulmonary artery and measuring approximately  2.4 x 2.7 cm. No additional mediastinal abnormalities. No hilar or axillary lymphadenopathy. Lungs/Pleura: Bibasilar atelectasis. There is no evidence of pulmonary edema, consolidation, pneumothorax, nodule or pleural fluid. Upper Abdomen: In the visualized left upper  abdomen, there is suggestion of new inflammatory stranding in the posterior left upper quadrant fat adjacent to the tail of the pancreas and splenic flexure of the colon. Inflammatory process such as colitis, diverticulitis or pancreatitis cannot be excluded and correlation is suggested with laboratory tests. CT of the abdomen and pelvis with contrast may be helpful if there is any suspicion for an acute abdominal process. 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 Review of the MIP images confirms the above findings. IMPRESSION: 1. No evidence of pulmonary embolism or other acute findings in the chest. 2. Stable well-circumscribed solid nodule versus complex cyst in the anterior mediastinum immediately anterior to the main pulmonary artery. This has been stable for 8 months. Additional follow-up in the 12 month range would be appropriate to ensure continued stability. 3. Suggestion of new inflammatory stranding in the posterior left upper quadrant fat adjacent to the tail of the pancreas and splenic flexure of the colon. Inflammatory process such as colitis, diverticulitis or pancreatitis cannot be excluded and correlation suggested with laboratory tests. CT of the abdomen and pelvis with contrast may be helpful if there is any suspicion for an acute abdominal process. Electronically Signed   By: Irish Lack M.D.   On: 03/22/2019 10:47   DG CHEST PORT 1 VIEW  Result Date: 03/24/2019 CLINICAL DATA:  History of asthma.  Chronic respiratory failure. EXAM: PORTABLE CHEST 1 VIEW COMPARISON:  CT 03/22/2019.  Portable chest 07/27/2018. FINDINGS: Cardiomegaly with prominent central pulmonary arteries. This suggest possibility of pulmonary hypertension. Mild left mid lung field subsegmental atelectasis. No pleural effusion or pneumothorax. Reference is made to CT chest report 03/22/2019 for discussion of small anterior mediastinal lesion. IMPRESSION: 1. Cardiomegaly with prominent  central pulmonary arteries. This suggest possibility of pulmonary hypertension. 2.  Mild left mid lung field subsegmental atelectasis. 3. Reference is made to CT chest report of 03/22/2019 for discussion of small anterior mediastinal lesion. Electronically Signed   By: Maisie Fus  Register   On: 03/24/2019 17:04   DG Knee Right Port  Result Date: 03/21/2019 CLINICAL DATA:  Acute on chronic right knee pain.  No known injury. EXAM: PORTABLE RIGHT KNEE - 1-2 VIEW COMPARISON:  Plain films right knee 12/17/2018. FINDINGS: There is no acute bony or joint abnormality. Advanced osteoarthritis about the knee is again seen and worst in the medial compartment where there is bone-on-bone joint space narrowing. Soft tissues are unremarkable. IMPRESSION: No acute abnormality. Advanced osteoarthritis is most severe in the medial compartment. Electronically Signed   By: Drusilla Kanner M.D.   On: 03/21/2019 15:35   ECHOCARDIOGRAM COMPLETE  Result Date: 03/26/2019   ECHOCARDIOGRAM REPORT   Patient Name:   Janice Ryan Date of Exam: 03/26/2019 Medical Rec #:  [id number]             Height:       68.0 in Accession #:    [id number]            Weight:       370.4 lb Date of Birth:  01/18/1956              BSA:          2.66 m Patient Age:    47 years  BP:           170/98 mmHg Patient Gender: F                     HR:           87 bpm. Exam Location:  Inpatient Procedure: 2D Echo, Color Doppler, Cardiac Doppler and Intracardiac            Opacification Agent Indications:    R06.9 DOE  History:        Patient has prior history of Echocardiogram examinations, most                 recent 07/28/2018. Risk Factors:Hypertension.  Sonographer:    Irving Burton Senior RDCS Referring Phys: [id number] PETER E BABCOCK  Sonographer Comments: Technically difficult study due to poor echo windows and patient is morbidly obese. Scanned supine on BiPap. IMPRESSIONS  1. Left ventricular ejection fraction, by visual estimation, is >75%. The  left ventricle has hyperdynamic function.  2. Left ventricular diastolic parameters are consistent with Grade I diastolic dysfunction (impaired relaxation).  3. The left ventricle has no regional wall motion abnormalities.  4. Global right ventricle has normal systolic function.The right ventricular size is normal. Right vetricular wall thickness was not assessed.  5. Left atrial size was normal.  6. Right atrial size was normal.  7. The mitral valve was not well visualized.  8. The tricuspid valve is not well visualized.  9. The aortic valve was not well visualized. 10. The pulmonic valve was not well visualized. 11. The aortic root was not well visualized. 12. The inferior vena cava is dilated in size with >50% respiratory variability, suggesting right atrial pressure of 8 mmHg. 13. The interatrial septum was not well visualized. 14. Extremely limited; definity used; hyperdynamic LV function; grade 1 diastolic dysfunction; no pericardial effusion; no other useful information can be obtained from this study. FINDINGS  Left Ventricle: Left ventricular ejection fraction, by visual estimation, is >75%. The left ventricle has hyperdynamic function. The left ventricle has no regional wall motion abnormalities. There is no left ventricular hypertrophy. Left ventricular diastolic parameters are consistent with Grade I diastolic dysfunction (impaired relaxation). Right Ventricle: The right ventricular size is normal.Global RV systolic function is has normal systolic function. Left Atrium: Left atrial size was normal in size. Right Atrium: Right atrial size was normal in size Pericardium: There is no evidence of pericardial effusion. Mitral Valve: The mitral valve was not well visualized. Tricuspid Valve: The tricuspid valve is not well visualized. Aortic Valve: The aortic valve was not well visualized. Pulmonic Valve: The pulmonic valve was not well visualized. Aorta: The aortic root was not well visualized. Venous: The  inferior vena cava is dilated in size with greater than 50% respiratory variability, suggesting right atrial pressure of 8 mmHg. IAS/Shunts: The interatrial septum was not well visualized. Additional Comments: Extremely limited; definity used; hyperdynamic LV function; grade 1 diastolic dysfunction; no pericardial effusion; no other useful information can be obtained from this study.   Diastology LV e' lateral:   8.92 cm/s LV E/e' lateral: 9.5 LV e' medial:    6.74 cm/s LV E/e' medial:  12.6  LEFT ATRIUM             Index LA Vol (A2C):   65.9 ml 24.82 ml/m LA Vol (A4C):   58.6 ml 22.07 ml/m LA Biplane Vol: 63.4 ml 23.87 ml/m  AORTIC VALVE LVOT Vmax:   84.20 cm/s LVOT Vmean:  60.600 cm/s  LVOT VTI:    0.150 m MITRAL VALVE MV Area (PHT): 2.56 cm              SHUNTS MV PHT:        85.84 msec            Systemic VTI: 0.15 m MV Decel Time: 296 msec MV E velocity: 84.80 cm/s  103 cm/s MV A velocity: 102.00 cm/s 70.3 cm/s MV E/A ratio:  0.83        1.5  Olga MillersBrian Crenshaw MD Electronically signed by Olga MillersBrian Crenshaw MD Signature Date/Time: 03/26/2019/3:44:37 PM    Final    VAS US LOWER EXTREMITY VENOUS (DVT) (ONLY [facility] & [facility])  Result Date: 03/22/2019  Lower Venous Study Indications: Swelling.  Risk Factors: None identified. Limitations: Body habitus, poor ultrasound/tissue interface and patient positioning, patient immobility, patient somnolence. Comparison Study: No prior studies. Performing Technologist: Chanda BusingGregory Collins RVT  Examination Guidelines: A complete evaluation includes B-mode imaging, spectral Doppler, color Doppler, and power Doppler as needed of all accessible portions of each vessel. Bilateral testing is considered an integral part of a complete examination. Limited examinations for reoccurring indications may be performed as noted.  +---------+---------------+---------+-----------+----------+--------------+ RIGHT    CompressibilityPhasicitySpontaneityPropertiesThrombus Aging  +---------+---------------+---------+-----------+----------+--------------+ CFV      Full           Yes      Yes                                 +---------+---------------+---------+-----------+----------+--------------+ SFJ      Full                                                        +---------+---------------+---------+-----------+----------+--------------+ FV Prox                                               Not visualized +---------+---------------+---------+-----------+----------+--------------+ FV Mid                                                Not visualized +---------+---------------+---------+-----------+----------+--------------+ FV Distal                                             Not visualized +---------+---------------+---------+-----------+----------+--------------+ PFV                                                   Not visualized +---------+---------------+---------+-----------+----------+--------------+ POP      Full           Yes      Yes                                 +---------+---------------+---------+-----------+----------+--------------+ PTV  Full                                                        +---------+---------------+---------+-----------+----------+--------------+ PERO                                                  Not visualized +---------+---------------+---------+-----------+----------+--------------+   +---------+---------------+---------+-----------+----------+--------------+ LEFT     CompressibilityPhasicitySpontaneityPropertiesThrombus Aging +---------+---------------+---------+-----------+----------+--------------+ CFV      Full           Yes      Yes                                 +---------+---------------+---------+-----------+----------+--------------+ SFJ      Full                                                         +---------+---------------+---------+-----------+----------+--------------+ FV Prox  Full                                                        +---------+---------------+---------+-----------+----------+--------------+ FV Mid                                                Not visualized +---------+---------------+---------+-----------+----------+--------------+ FV Distal                                             Not visualized +---------+---------------+---------+-----------+----------+--------------+ PFV                                                   Not visualized +---------+---------------+---------+-----------+----------+--------------+ POP      Full           Yes      Yes                                 +---------+---------------+---------+-----------+----------+--------------+ PTV                                                   Not visualized +---------+---------------+---------+-----------+----------+--------------+ PERO  Not visualized +---------+---------------+---------+-----------+----------+--------------+     Summary: Right: There is no evidence of deep vein thrombosis in the lower extremity. However, portions of this examination were limited- see technologist comments above. No cystic structure found in the popliteal fossa. Left: There is no evidence of deep vein thrombosis in the lower extremity. However, portions of this examination were limited- see technologist comments above. No cystic structure found in the popliteal fossa.  *See table(s) above for measurements and observations. Electronically signed by Coral Else MD on 03/22/2019 at 4:46:01 PM.    Final       Subjective: Patient seen and examined the bedside this afternoon.  Hemodynamically stable for discharge.  She understands discharge planning  Discharge Exam: Vitals:   04/02/19 0955 04/02/19 1445  BP:  127/74  Pulse:  80   Resp:  20  Temp:  98 F (36.7 C)  SpO2: 90% 96%   Vitals:   04/01/19 2230 04/02/19 0503 04/02/19 0955 04/02/19 1445  BP:  139/70  127/74  Pulse: 88 91  80  Resp: 18 18  20   Temp:  98.3 F (36.8 C)  98 F (36.7 C)  TempSrc:  Oral  Oral  SpO2: 94% 93% 90% 96%  Weight:      Height:        General: Pt is alert, awake, not in acute distress, morbidly obese Cardiovascular: RRR, S1/S2 +, no rubs, no gallops Respiratory: CTA bilaterally, no wheezing, no rhonchi Abdominal: Soft, NT, ND, bowel sounds + Extremities: Trace edema, no cyanosis    The results of significant diagnostics from this hospitalization (including imaging, microbiology, ancillary and laboratory) are listed below for reference.     Microbiology: Recent Results (from the past 240 hour(s))  MRSA PCR Screening     Status: None   Collection Time: 03/26/19  2:11 PM   Specimen: Nasal Mucosa; Nasopharyngeal  Result Value Ref Range Status   MRSA by PCR NEGATIVE NEGATIVE Final    Comment:        The GeneXpert MRSA Assay (FDA approved for NASAL specimens only), is one component of a comprehensive MRSA colonization surveillance program. It is not intended to diagnose MRSA infection nor to guide or monitor treatment for MRSA infections. Performed at [facility], [address] [place], [place] [address]      Labs: BNP (last 3 results) Recent Labs    07/27/18 2352 03/22/19 0709  BNP 57.5 57.0   Basic Metabolic Panel: Recent Labs  Lab 03/26/19 1520 03/27/19 1214 03/29/19 0547 03/31/19 1002 04/01/19 0539  NA 139  --  140 137 138  K 4.0  --  4.0 4.2 4.3  CL 89*  --  89* 91* 94*  CO2 42*  --  42* 38* 35*  GLUCOSE 99  --  106* 152* 122*  BUN 13  --  10 10 11   CREATININE 0.70  --  0.63 0.79 0.78  CALCIUM 10.6* 10.8* 10.6* 10.4* 10.6*   Liver Function Tests: No results for input(s): AST, ALT, ALKPHOS, BILITOT, PROT, ALBUMIN in the last 168 hours. No results for input(s):  LIPASE, AMYLASE in the last 168 hours. No results for input(s): AMMONIA in the last 168 hours. CBC: Recent Labs  Lab 03/26/19 1520 03/27/19 0146 03/31/19 1002  WBC 10.0 9.4 8.3  HGB 11.0* 10.9* 10.8*  HCT 37.3 38.3 36.6  MCV 98.4 99.5 97.1  PLT 288 290 322   Cardiac Enzymes: No results for input(s): CKTOTAL, CKMB, CKMBINDEX, TROPONINI in the last  168 hours. BNP: Invalid input(s): POCBNP CBG: No results for input(s): GLUCAP in the last 168 hours. D-Dimer No results for input(s): DDIMER in the last 72 hours. Hgb A1c No results for input(s): HGBA1C in the last 72 hours. Lipid Profile No results for input(s): CHOL, HDL, LDLCALC, TRIG, CHOLHDL, LDLDIRECT in the last 72 hours. Thyroid function studies No results for input(s): TSH, T4TOTAL, T3FREE, THYROIDAB in the last 72 hours.  Invalid input(s): FREET3 Anemia work up No results for input(s): VITAMINB12, FOLATE, FERRITIN, TIBC, IRON, RETICCTPCT in the last 72 hours. Urinalysis No results found for: COLORURINE, APPEARANCEUR, LABSPEC, PHURINE, GLUCOSEU, HGBUR, BILIRUBINUR, KETONESUR, PROTEINUR, UROBILINOGEN, NITRITE, LEUKOCYTESUR Sepsis Labs Invalid input(s): PROCALCITONIN,  WBC,  LACTICIDVEN Microbiology Recent Results (from the past 240 hour(s))  MRSA PCR Screening     Status: None   Collection Time: 03/26/19  2:11 PM   Specimen: Nasal Mucosa; Nasopharyngeal  Result Value Ref Range Status   MRSA by PCR NEGATIVE NEGATIVE Final    Comment:        The GeneXpert MRSA Assay (FDA approved for NASAL specimens only), is one component of a comprehensive MRSA colonization surveillance program. It is not intended to diagnose MRSA infection nor to guide or monitor treatment for MRSA infections. Performed at [facility], [address] [place], [place] [address]     Please note: You were cared for by a hospitalist during your hospital stay. Once you are discharged, your primary care physician will handle  any further medical issues. Please note that NO REFILLS for any discharge medications will be authorized once you are discharged, as it is imperative that you return to your primary care physician (or establish a relationship with a primary care physician if you do not have one) for your post hospital discharge needs so that they can reassess your need for medications and monitor your lab values.    Time coordinating discharge: 40 minutes  SIGNED:   Burnadette Pop, MD  [facility] 04/02/2019, 2:52 PM Pager [phone]  If 7PM-7AM, please contact night-coverage [email or web address] Password [email or web address]

## 2019-04-02 NOTE — Progress Notes (Signed)
Physical Therapy Treatment Patient Details Name: Janice Ryan MRN: [id number] DOB: [birth year] Today's Date: 04/02/2019    History of Present Illness 63 yo female admitted with LE cellulitis. Hx of morbid obesity, asthma, anxiety, chronic R knee pain    PT Comments    Pt is agreeable to exercises, declines OOB at this time d/t lunch arrival. Reviewed HEP for pt to utilize at home, 10 reps 2-3 x per day. Discussed R knee discomfort, not overdoing exercise on RLE however that lack of activity can also exacerbate knee issues, pt aware of need for lifestyle changes. Pt concurs. Pt planning to d/c home today. Continue to recommend HHPT  Follow Up Recommendations  Home health PT;Supervision for mobility/OOB(no ins/unable to d/c to SNF)     Equipment Recommendations       Recommendations for Other Services       Precautions / Restrictions Precautions Precautions: Fall Precaution Comments: O2 dep 2-3L @ baseline Required Braces or Orthoses: Other Brace Other Brace: neoprene sleeve for right knee Restrictions Weight Bearing Restrictions: No    Mobility  Bed Mobility               General bed mobility comments: pt declined OOB sinc eher lunch just arrived, and she is going to go home today  Transfers                    Ambulation/Gait                 Stairs             Wheelchair Mobility    Modified Rankin (Stroke Patients Only)       Balance                                            Cognition Arousal/Alertness: Awake/alert Behavior During Therapy: WFL for tasks assessed/performed Overall Cognitive Status: Within Functional Limits for tasks assessed                                        Exercises Total Joint Exercises Straight Leg Raises: AROM;AAROM;Left;10 reps General Exercises - Lower Extremity Ankle Circles/Pumps: AROM;Both;20 reps Quad Sets: AROM;Both;10 reps Gluteal Sets:  AROM;Both;10 reps Low Level/ICU Exercises Heel Slides: AROM;Both;10 reps;Supine    General Comments        Pertinent Vitals/Pain Faces Pain Scale: Hurts little more Pain Location: R knee Pain Descriptors / Indicators: Sore;Discomfort Pain Intervention(s): Limited activity within patient's tolerance;Monitored during session    Home Living                      Prior Function            PT Goals (current goals can now be found in the care plan section) Acute Rehab PT Goals Patient Stated Goal: home soon PT Goal Formulation: With patient Time For Goal Achievement: 04/06/19 Potential to Achieve Goals: Fair Progress towards PT goals: Progressing toward goals    Frequency    Min 2X/week      PT Plan Current plan remains appropriate;Frequency needs to be updated    Co-evaluation              AM-PAC PT "6 Clicks" Mobility   Outcome Measure  Help needed turning from  your back to your side while in a flat bed without using bedrails?: A Little Help needed moving from lying on your back to sitting on the side of a flat bed without using bedrails?: A Little Help needed moving to and from a bed to a chair (including a wheelchair)?: A Lot Help needed standing up from a chair using your arms (e.g., wheelchair or bedside chair)?: A Little Help needed to walk in hospital room?: A Little Help needed climbing 3-5 steps with a railing? : Total 6 Click Score: 15    End of Session Equipment Utilized During Treatment: Oxygen Activity Tolerance: Other (comment)(constraints d/t timing of lunch and plans to d/c) Patient left: in bed;with call bell/phone within reach Nurse Communication: Mobility status PT Visit Diagnosis: Muscle weakness (generalized) (M62.81);Difficulty in walking, not elsewhere classified (R26.2);Pain Pain - Right/Left: Right Pain - part of body: Knee     Time: [id number] PT Time Calculation (min) (ACUTE ONLY): 14 min  Charges:  $Therapeutic  Exercise: 8-22 mins                     Delice Bison, PT   Acute Rehab Dept [facility]): [phone]   04/02/2019    [facility] 04/02/2019, 3:15 PM

## 2019-04-02 NOTE — Plan of Care (Signed)
  Problem: Clinical Measurements: Goal: Ability to avoid or minimize complications of infection will improve Outcome: Completed/Met   Problem: Skin Integrity: Goal: Skin integrity will improve Outcome: Completed/Met   Problem: Health Behavior/Discharge Planning: Goal: Ability to manage health-related needs will improve Outcome: Progressing   Problem: Activity: Goal: Risk for activity intolerance will decrease Outcome: Completed/Met   Problem: Nutrition: Goal: Adequate nutrition will be maintained Outcome: Completed/Met   Problem: Coping: Goal: Level of anxiety will decrease Outcome: Completed/Met   Problem: Elimination: Goal: Will not experience complications related to bowel motility Outcome: Completed/Met Goal: Will not experience complications related to urinary retention Outcome: Completed/Met   Problem: Pain Managment: Goal: General experience of comfort will improve Outcome: Completed/Met   Problem: Safety: Goal: Ability to remain free from injury will improve Outcome: Completed/Met

## 2019-04-03 ENCOUNTER — Telehealth: Payer: Self-pay

## 2019-04-03 NOTE — Telephone Encounter (Signed)
Transition Care Management Follow-up Telephone Call Date of discharge and from where: 04/02/2019, [facility]   Attempted to contact patient # [phone], message left with call back requested to this CM # [phone]  She has an appt with Dr Chapman Fitch on 04/11/2019

## 2019-04-07 ENCOUNTER — Telehealth: Payer: Self-pay

## 2019-04-07 ENCOUNTER — Other Ambulatory Visit: Payer: Self-pay | Admitting: Family Medicine

## 2019-04-07 DIAGNOSIS — R21 Rash and other nonspecific skin eruption: Secondary | ICD-10-CM

## 2019-04-07 MED ORDER — FLUCONAZOLE 150 MG PO TABS: 150.0000 mg | ORAL_TABLET | Freq: Once | ORAL | 0 refills | Status: DC

## 2019-04-07 MED ORDER — NYSTATIN 100000 UNIT/GM EX CREA: 1.0000 "application " | TOPICAL_CREAM | Freq: Two times a day (BID) | CUTANEOUS | 3 refills | Status: DC

## 2019-04-07 MED ORDER — FLUCONAZOLE 150 MG PO TABS: 150.0000 mg | ORAL_TABLET | Freq: Once | ORAL | 0 refills | Status: AC

## 2019-04-07 NOTE — Telephone Encounter (Signed)
Transition Care Management Follow-up Telephone Call  Date of discharge and from where: 04/02/2019, [facility]   How have you been since you were released from the hospital? She had multiple concerns which are noted below.  Overall, she stated that she is very weak and is having a difficult time with her mobility  Any questions or concerns? She said that the cellulitis has returned on both legs with light redness from her feet to thighs. No open areas. She is requesting an antibiotic.    She is also complaining of a rash between her legs, buttocks and perineal area.  She said that her skin is raw and painful and it is very difficult for her to sit down, she is trying to sit on pillows . She is requesting a cream for the rash.  She said that she is experiencing generalized pain from her decreased mobility and prolonged bedrest/hospitalization.  She said that the pain can be 8-9/10 and she would like tramadol.  Informed her that per her discharge instructions, she is to stop tramadol.  She said that she wasn't taking it prior to her hospitalization and she needs it for her current pain.  Informed her that Dr Chapman Fitch would be notified of the request. .    She is complaining of  "shakes in my body" and said that her whole body will shake sometimes,  She explained that this occurred in the hospital but is more severe now.  She also noted that the shaking is worse in the morning and will diminish after she takes her medications. She denied any weakness or numbness but said that sometimes it is difficult to hold her silverware. She noted that she used to take gabapentin 300 mg three times daily and her new orders state to take gabapentin 100mg  three times daily.   Items Reviewed:  Did the pt receive and understand the discharge instructions provided? Yes, she has the instructions and said that she does not have any questions.   Medications obtained and verified? She said that she has all  medications, including the new ones and is taking them as ordered and is aware of the changes with the orders.  She did not have any questions and did not want to review the medication list.   She noted that she will need to get a refill of her albuterol nebulizer solution.   Any new allergies since your discharge?none reported   Do you have support at home? She lives with her husband and son but they are not always home with her. Both her husband and son are currently unemployed.   Other (ie: DME, Home Health, etc) no home health ordered.   She has a nebulizer and home O2 which she is using at 3L.  She does not have the trilogy. It was noted in her medical record that she would be able to afford renting the machine. She said that she has no income at this time and needs a sleep study prior to renting this equipment. She said that [employer] told her that the rental would be $210 for the first month and she is not able to afford that.    Functional Questionnaire: (I = Independent and D = Dependent) ADL's: independent, family provides assist as needed. She has RW for ambulation.    Follow up appointments reviewed:    PCP Hospital f/u appt confirmed? She has appt with Dr Chapman Fitch - 04/11/2019 @ 1410.   [facility] f/u appt confirmed? Needs  to schedule an appt with pulmonary.  She said that she has an appt with orthopedics - Dr Sharl Ma 04/10/2019.  Are transportation arrangements needed?no , she said she will have transportation.   If their condition worsens, is the pt aware to call  their PCP or go to the ED? yes  Was the patient provided with contact information for the PCP's office or ED? She has the phone number for the clinic  Was the pt encouraged to call back with questions or concerns? Yes  She currently has no insurance

## 2019-04-07 NOTE — Telephone Encounter (Signed)
Transition Care Management Follow-up Telephone Call Date of discharge and from where: 04/02/2019, [facility] .  Call placed to patient # [id number] , message left with call back requested to this CM # [phone].  Dennie Bible has an appt with Dr Jillyn Hidden 04/11/2019

## 2019-04-07 NOTE — Telephone Encounter (Signed)
LMTCB x1 for pt.   If pt calls back, she needs to be scheduled for sleep consult.

## 2019-04-07 NOTE — Progress Notes (Signed)
Patient ID: Janice Ryan, female   DOB: [birth year], 64 y.o.   MRN: [id number]   Patient is status post hospitalization and per RN care manager, patient has complaint of rash which sounds fungal in nature as she has been on Abx therapy for cellulitis during hospitalization but also has complaint of possible continued cellulitis as she feels that her legs are still red and slightly warm. Will have nursing notify patient to go to urgent care/ED if she suspects that she has had return of cellulitis as this had resolved as per hospital discharge summary. Will send in RX for diflucan and nystatin for possible fungal rash. Keep upcoming appointment in follow-up of hospital discharge.

## 2019-04-07 NOTE — Telephone Encounter (Signed)
Please let patient know that prescriptions for nystatin cream and diflucan have been sent into her pharmacy at [facility] as well as [facility] to treat rash which may be fungal due to her recent use of antibiotics. She may want to be seen at her local urgent care or ED if she believes that her cellulitis has returned. She should keep her upcoming office appointment as well. Tramadol was stopped at time of hospital discharge. ( I wonder if her upcoming appointment with Dr. Sharl Ma is with Dr. Sharl Ma in endocrinology).

## 2019-04-08 MED FILL — FLUCONAZOLE 150 MG TABLET: 150 | 3 days supply | Qty: 2 | Fill #0

## 2019-04-08 MED FILL — NYSTATIN 100,000 UNIT/GM CR: 100000 | 7 days supply | Qty: 30 | Fill #0

## 2019-04-08 NOTE — Telephone Encounter (Signed)
Attempted to contact the patient to report Dr Fulp's orders. Message left with call back requested to this CM # [phone]

## 2019-04-08 NOTE — Telephone Encounter (Signed)
ATC pt, no answer. Left message for pt to call back.  

## 2019-04-09 ENCOUNTER — Ambulatory Visit: Payer: Self-pay | Admitting: Nurse Practitioner

## 2019-04-09 NOTE — Telephone Encounter (Signed)
LMTCB x3 for pt. We have attempted to contact pt several times with no success or call back from pt. Per triage protocol, message will be closed.   

## 2019-04-09 NOTE — Telephone Encounter (Signed)
Call placed to patient and informed her that the prescriptions for diflucan and nystatin cream have been sent to her pharmacy. If she feels her cellulitis has returned she should go to urgent care. She said that she understood.  She also noted that she will be at her appt with Dr Jillyn Hidden on 04/11/2019. She  said that her upcoming appointment with Dr Sharl Ma is orthopedics.  This CM also explained that tramadol has not been ordered as it was stopped at discharge.

## 2019-04-11 ENCOUNTER — Other Ambulatory Visit: Payer: Self-pay

## 2019-04-11 ENCOUNTER — Ambulatory Visit: Payer: Self-pay | Attending: Family Medicine | Admitting: Family Medicine

## 2019-04-11 DIAGNOSIS — Z5329 Procedure and treatment not carried out because of patient's decision for other reasons: Secondary | ICD-10-CM

## 2019-04-13 ENCOUNTER — Encounter: Payer: Self-pay | Admitting: Family Medicine

## 2019-04-13 NOTE — Progress Notes (Signed)
Patient ID: Janice Ryan, female   DOB: Nov 08, 1955, 64 y.o.   MRN: [id number]   Patient had scheduled appointment for 04/11/2019 however patient was unable to be contacted by phone on the day prior or on the day of her appointment.  Patient was a no-show for hospital follow-up appointment on 04/11/2019.

## 2019-04-16 ENCOUNTER — Telehealth: Payer: Self-pay | Admitting: Family Medicine

## 2019-04-16 NOTE — Telephone Encounter (Signed)
Left message on voicemail to return call.

## 2019-04-16 NOTE — Telephone Encounter (Signed)
Pt called requesting to speak to nurse because shes having procedure done and was in hospital 3 weeks and wants to speak to doctor before procedure

## 2019-04-17 ENCOUNTER — Other Ambulatory Visit: Payer: Self-pay | Admitting: Family Medicine

## 2019-04-17 DIAGNOSIS — Z8739 Personal history of other diseases of the musculoskeletal system and connective tissue: Secondary | ICD-10-CM

## 2019-04-18 ENCOUNTER — Ambulatory Visit: Payer: Self-pay | Admitting: Family Medicine

## 2019-04-18 NOTE — Telephone Encounter (Signed)
Patient called to speak with her PCP because she states that she needs to have a sleep study done. Patient had an appt today with PCP but it had to be rescheduled due to the provider being out of the office. Please f/u

## 2019-04-20 NOTE — Telephone Encounter (Addendum)
Patient has appointment with PCP on 04/23/2019.

## 2019-04-23 ENCOUNTER — Ambulatory Visit: Payer: Self-pay | Attending: Family Medicine | Admitting: Family Medicine

## 2019-04-23 ENCOUNTER — Other Ambulatory Visit: Payer: Self-pay

## 2019-04-23 ENCOUNTER — Encounter: Payer: Self-pay | Admitting: Family Medicine

## 2019-04-23 DIAGNOSIS — G479 Sleep disorder, unspecified: Secondary | ICD-10-CM

## 2019-04-23 DIAGNOSIS — J4540 Moderate persistent asthma, uncomplicated: Secondary | ICD-10-CM

## 2019-04-23 DIAGNOSIS — Z09 Encounter for follow-up examination after completed treatment for conditions other than malignant neoplasm: Secondary | ICD-10-CM

## 2019-04-23 DIAGNOSIS — I872 Venous insufficiency (chronic) (peripheral): Secondary | ICD-10-CM

## 2019-04-23 DIAGNOSIS — M1711 Unilateral primary osteoarthritis, right knee: Secondary | ICD-10-CM

## 2019-04-23 DIAGNOSIS — G8929 Other chronic pain: Secondary | ICD-10-CM

## 2019-04-23 DIAGNOSIS — Z9981 Dependence on supplemental oxygen: Secondary | ICD-10-CM

## 2019-04-23 DIAGNOSIS — M25561 Pain in right knee: Secondary | ICD-10-CM

## 2019-04-23 DIAGNOSIS — F411 Generalized anxiety disorder: Secondary | ICD-10-CM

## 2019-04-23 DIAGNOSIS — J9611 Chronic respiratory failure with hypoxia: Secondary | ICD-10-CM

## 2019-04-23 DIAGNOSIS — M797 Fibromyalgia: Secondary | ICD-10-CM

## 2019-04-23 MED ORDER — GABAPENTIN 100 MG PO CAPS: 100.0000 mg | ORAL_CAPSULE | Freq: Three times a day (TID) | ORAL | 2 refills | Status: DC

## 2019-04-23 MED ORDER — BUSPIRONE HCL 5 MG PO TABS: 5.0000 mg | ORAL_TABLET | Freq: Three times a day (TID) | ORAL | 2 refills | Status: DC

## 2019-04-23 MED ORDER — DULOXETINE HCL 60 MG PO CPEP: 60.0000 mg | ORAL_CAPSULE | Freq: Every day | ORAL | 2 refills | Status: DC

## 2019-04-23 NOTE — Progress Notes (Signed)
Virtual Visit via Telephone Note  I connected with Janice Ryan on 04/23/19 at 10:50 AM EST by telephone and verified that I am speaking with the correct person using two identifiers.   I discussed the limitations, risks, security and privacy concerns of performing an evaluation and management service by telephone and the availability of in person appointments. I also discussed with the patient that there may be a patient responsible charge related to this service. The patient expressed understanding and agreed to proceed.  Patient Location: Home Provider Location: [facility] Others participating in call: none   History of Present Illness:        64 yo female who is being seen status post hospitalization from 03/20/2019-04/02/2019 and per patient she is still feeling weak. She also feels that her ankles still look a little red and she is concerned that she still has cellulitis. No increased warmth to the skin and no increased swelling but she is concerned because her sister is having issues with cellulitis.  She reports that the redness has been there since her hospitalization and has not increased in size.  Area of skin changes not causing any discomfort.         She reports that she continues to wear her oxygen continuously status post hospital discharge.  She has not made arrangements to obtain BiPAP or have repeat sleep study.  She does report that she was diagnosed in the past with sleep ap[place] however she never used CPAP on a regular basis.  She is using her albuterol inhaler and nebulizer to help with her breathing.  She reports that she is taking her medications as per hospital discharge however she has not yet made an appointment to follow-up with pulmonology as she reports that she has issues with transportation as she has to have her son or husband take her to appointments.  She also has never followed up with orthopedics regarding her knee  pain.  She reports that she continues to have severe right knee pain after a fall a few months ago.  She feels that she needs tramadol to help with the pain as over-the-counter medications are not helping.  She has not recently tried Voltaren gel as per hospital discharge suggestion.  She reports difficulty with ambulation secondary to her knee pain and reports that her current pain is about an 8 to a 9 on a 0-to-10 scale with 0 being no pain and 10 being the worst possible pain.  She reports that she is afraid that her knee will give away on her and caused her to fall.          Due to issues with transportation she states that she never had the chance to establish care with a mental health provider/psychiatrist prior to her hospitalization.  She does need refills of her BuSpar and Cymbalta which she feels helped her anxiety but she does not feel that her anxiety is well controlled.  She also continues to have generalized muscle aches and fatigue related to her fibromyalgia.  She feels that the Cymbalta slightly helps with her fibromyalgia but she also needs refill of gabapentin which seems to also help dull the pain associated with her fibromyalgia.  She continues to have difficulty with sleep secondary to issues with chronic pain.  She is aware that she should not take over-the-counter sleep medicine that may cause drowsiness because of her breathing issues but she also feels that she does  not sleep well more so secondary to the pain.  On further review of systems she denies any chest pain or palpitations, she continues to have some shortness of breath but denies cough.  She denies any fever or chills status post hospitalization.  She denies current urinary frequency, urgency or dysuria.  No current nausea/vomiting/diarrhea or constipation and no abdominal pain at this time.  She continues to have pain in multiple joints including lower back, hips and knees as well as the shoulders.  She denies suicidal thoughts  or ideations.   Past Medical History:  Diagnosis Date  . Acute respiratory failure with hypoxia (HCC)   . Asthma   . Hypertension   . Morbid obesity (HCC)     Past Surgical History:  Procedure Laterality Date  . HERNIA REPAIR    . JOINT REPLACEMENT Left    knee    Family History  Problem Relation Age of Onset  . Atrial fibrillation Mother   . Congestive Heart Failure Father     Social History   Tobacco Use  . Smoking status: Never Smoker  . Smokeless tobacco: Never Used  Substance Use Topics  . Alcohol use: Never  . Drug use: Never     No Known Allergies     Observations/Objective: No vital signs or physical exam conducted as visit was done via telephone  Assessment and Plan: 1. Chronic respiratory failure with hypoxia (HCC); dependence on supplemental oxygen;  Hospital discharge follow-up She is status post hospitalization from 03/20/2019 through 04/02/2019 and missed scheduled appointment on 04/11/2019 but did keep today's visit however visit was to be in person and patient changed to telephone visit as she stated that she had issues with transportation.  Patient's hospitalization records were reviewed and discussed with the patient at today's visit.  Discussed the importance of patient following up with pulmonology as well as for sleep study as she had acute respiratory failure with CO2 narcosis and retention during hospitalization which required use of BiPAP.  Patient had been contacted by nurse clinical manager from this office and reported that she intended to pay out-of-pocket for BiPAP but she has not yet done so.  Per hospital discharge notes, she was also given the option of renting BiPAP from [facility].  New referral is being placed for her to follow-up with pulmonology as she missed appointment status post hospitalization and new referral being placed for sleep study.  She is encouraged to continue compliance with home oxygen therapy, continuous.  Also  reiterated suggestions as per hospital discharge that patient avoid Xanax and opiates which can contribute to respiratory depression. - Ambulatory referral to Pulmonology - Split night study; Future  2. Moderate persistent asthma without complication Patient reports history of asthma which also contributed to her issues with respiratory failure.  She has been asked to make sure that she follows up with pulmonology.  Per hospital discharge medication list she is to continue the use of Advair 500/50 along with as needed use of albuterol inhaler or nebulizer solution and continued use of Singulair 10 mg at bedtime. - Ambulatory referral to Pulmonology  3. Chronic pain of right knee; tricompartmental osteoarthritis Continue the use of Voltaren/diclofenac gel along with efforts at weight loss and follow-up with orthopedics.  Avoid opiates due to chronic respiratory failure.  Patient has had several referrals to orthopedics which she has not been able to keep therefore new referral placed. - AMB referral to orthopedics  4. GAD (generalized anxiety disorder) Patient with  longstanding issues with anxiety and other mental health issues and she has been asked to make sure that she keeps follow-up with a mental health professional/psychiatrist and BuSpar refill provided per patient request.  Continue the use of Cymbalta which can also help with pain issues and was also refilled at today's visit. - busPIRone (BUSPAR) 5 MG tablet; Take 1 tablet (5 mg total) by mouth 3 (three) times daily.  Dispense: 90 tablet; Refill: 2  5. Fibromyalgia Refills provided of gabapentin and Cymbalta for continued treatment of patient's reported fibromyalgia. - gabapentin (NEURONTIN) 100 MG capsule; Take 1 capsule (100 mg total) by mouth 3 (three) times daily.  Dispense: 90 capsule; Refill: 2 - DULoxetine (CYMBALTA) 60 MG capsule; Take 1 capsule (60 mg total) by mouth daily.  Dispense: 30 capsule; Refill: 2  6. Sleep  disturbance She reports continued issues with sleep disturbance and she is asked to complete sleep study as she does report a prior history of sleep apnea and did not remain compliant with use of CPAP and patient recently with acute respiratory failure during hospitalization requiring use of BiPAP.  Avoid sedative medications which can worsen respiratory failure. - Split night study; Future  7.  Venous stasis dermatitis She is concerned that she may have recurrence of cellulitis that she continues to have some skin changes of the lower extremities.  She does not report any increased warmth to the areas of skin changes and per hospital records, patient also with chronic venous stasis dermatitis which is the most likely cause of her current continued change in skin color and not cellulitis.  She should seek urgent care evaluation if she does notice increasing area of redness, pain, increased swelling, noticing the skin is warm to touch or any other concerns.   Follow Up Instructions:Return in about 2 weeks (around 05/07/2019) for chronic issues.    I discussed the assessment and treatment plan with the patient. The patient was provided an opportunity to ask questions and all were answered. The patient agreed with the plan and demonstrated an understanding of the instructions.   The patient was advised to call back or seek an in-person evaluation if the symptoms worsen or if the condition fails to improve as anticipated.  I provided 26 minutes of non-face-to-face time during this encounter.   Antony Blackbird, MD

## 2019-04-29 ENCOUNTER — Other Ambulatory Visit: Payer: Self-pay | Admitting: Family Medicine

## 2019-04-29 DIAGNOSIS — F411 Generalized anxiety disorder: Secondary | ICD-10-CM

## 2019-05-01 ENCOUNTER — Institutional Professional Consult (permissible substitution): Payer: Self-pay | Admitting: Pulmonary Disease

## 2019-05-06 ENCOUNTER — Telehealth: Payer: Self-pay | Admitting: Family Medicine

## 2019-05-06 NOTE — Telephone Encounter (Signed)
Patient called saying she would like for the dosage on the gabapentin be increased because she states it's not helping her. Patient is also requesting a refill on Trazodone.  Arna Medici- Patient is also requesting an update on her Orthopedic referral. Please f/u

## 2019-05-07 ENCOUNTER — Telehealth: Payer: Self-pay

## 2019-05-07 NOTE — Telephone Encounter (Signed)
Home health orders for SN, PT faxed to [facility]

## 2019-05-08 ENCOUNTER — Encounter: Payer: Self-pay | Admitting: Physical Medicine and Rehabilitation

## 2019-05-09 NOTE — Telephone Encounter (Signed)
Able to reach patient. Name and DOB verified. Patient informed of message per Dr. Jillyn Hidden. Patient states that Trazadone was give to her in the hospital. Does not know what she is supposed to do about her pain she has. Denies having respiratory issues at this time. Patient upset about the outcome. Advised of appointment on Monday May 12, 2019

## 2019-05-09 NOTE — Telephone Encounter (Signed)
Please let patient know that her trazodone and gabapentin doses will not be increased at this time due to her breathing issues. This can be discussed again once she has had her sleep study and obtained her bipap. Both meds were decreased during for hospitalization for acute respiratory failure

## 2019-05-12 ENCOUNTER — Other Ambulatory Visit: Payer: Self-pay

## 2019-05-12 ENCOUNTER — Telehealth: Payer: Self-pay | Admitting: Family Medicine

## 2019-05-12 ENCOUNTER — Ambulatory Visit: Payer: Self-pay | Attending: Family Medicine | Admitting: Family Medicine

## 2019-05-12 DIAGNOSIS — Z9119 Patient's noncompliance with other medical treatment and regimen: Secondary | ICD-10-CM

## 2019-05-12 DIAGNOSIS — M797 Fibromyalgia: Secondary | ICD-10-CM

## 2019-05-12 DIAGNOSIS — Z9981 Dependence on supplemental oxygen: Secondary | ICD-10-CM

## 2019-05-12 DIAGNOSIS — G8929 Other chronic pain: Secondary | ICD-10-CM

## 2019-05-12 DIAGNOSIS — Z91199 Patient's noncompliance with other medical treatment and regimen due to unspecified reason: Secondary | ICD-10-CM

## 2019-05-12 DIAGNOSIS — J9611 Chronic respiratory failure with hypoxia: Secondary | ICD-10-CM

## 2019-05-12 DIAGNOSIS — J4540 Moderate persistent asthma, uncomplicated: Secondary | ICD-10-CM

## 2019-05-12 DIAGNOSIS — M25561 Pain in right knee: Secondary | ICD-10-CM

## 2019-05-12 DIAGNOSIS — G479 Sleep disorder, unspecified: Secondary | ICD-10-CM

## 2019-05-12 MED ORDER — FLUTICASONE-SALMETEROL 500-50 MCG/DOSE IN AEPB: 1.0000 | INHALATION_SPRAY | Freq: Two times a day (BID) | RESPIRATORY_TRACT | 5 refills | Status: DC

## 2019-05-12 MED ORDER — TRAZODONE HCL 50 MG PO TABS: 50.0000 mg | ORAL_TABLET | Freq: Every evening | ORAL | 0 refills | Status: DC | PRN

## 2019-05-12 MED ORDER — TRAMADOL HCL 50 MG PO TABS: ORAL_TABLET | ORAL | 0 refills | Status: DC

## 2019-05-12 MED FILL — !ADVAIR 500/50 DISKUS: 500-50 | 60 days supply | Qty: 60 | Fill #0

## 2019-05-12 NOTE — Progress Notes (Signed)
Virtual Visit via Telephone Note  I connected with Janice Ryan on 05/12/19 at  9:10 AM EST by telephone and verified that I am speaking with the correct person using two identifiers.   I discussed the limitations, risks, security and privacy concerns of performing an evaluation and management service by telephone and the availability of in person appointments. I also discussed with the patient that there may be a patient responsible charge related to this service. The patient expressed understanding and agreed to proceed.  Patient Location: Home Provider Location: [facility] Others participating in call: none   History of Present Illness:       64 yo female, who is seen in follow-up of chronic issues as well as issues from hospitalization from 03/20/2019 through 04/02/2019. She reports that she was contacted regarding scheduling her sleep study however she does not wish to have a sleep study done at the facility at [facility] instead she wishes to have her sleep study done at home. She also reports that she has not yet followed up with pulmonology and has not seen orthopedics regarding her knee pain.  She complains of pain in her lower back and knee that is always around a 9-10 on a 0-to-10 scale.  She reports that she also would like to restart the use of Xanax as well as have an increased dose of trazodone to help with sleep.  She also request a new prescription for tramadol for chronic knee pain.  She reports that they were providing her with Xanax, higher dose trazodone as well as tramadol on a regular basis when she was in the hospital in December.  She reports that she does finally have a follow-up with mental health this Saturday and then mental health agreed to see her via video.  She reports that she has not slept in the past 2 to 3 days and she additionally has a bad headache.  She also reports that she needs an increased dose of gabapentin as at the time of  hospitalization her dose was decreased 200 mg 3 times daily and this is not helping with her knee pain or her fibromyalgia.          She reports that she does not use her oxygen continuously at home as her oxygen saturation is 93% without the oxygen and heart rate of 91.  She does use her oxygen at night.  She reports that she does need a [facility] due to the cost.  She would like trazodone sent to a different pharmacy near her home in [place].  She also request a prescription for tramadol as she states that due to her knee pain, she is unable to leave her home in order to go see orthopedics or any other specialist.  She reports that she cannot navigate the stairs as she lives on the second floor.  She reports that when she was brought home from the hospital, EMS used a bag in which to carry her up the stairs to her home.  She reports that she needs a prescription for tramadol in order to be able to go down the stairs at her home and keep her follow-up appointment with orthopedics.  She reports that she is currently taking medication for pain including 12 of the 200 mg ibuprofen per day along with 3500 mg up to 4 g of Tylenol daily and this does not give her enough pain relief  to be able to walk down the stairs at her apartment.  She also reports that she does not believe that she would be able to lean on her husband and son for support for help down the stairs.  Past Medical History:  Diagnosis Date  . Acute respiratory failure with hypoxia (HCC)   . Asthma   . Hypertension   . Morbid obesity (HCC)     Past Surgical History:  Procedure Laterality Date  . HERNIA REPAIR    . JOINT REPLACEMENT Left    knee    Family History  Problem Relation Age of Onset  . Atrial fibrillation Mother   . Congestive Heart Failure Father     Social History   Tobacco Use  . Smoking status: Never Smoker  . Smokeless tobacco: Never Used  Substance Use  Topics  . Alcohol use: Never  . Drug use: Never     No Known Allergies     Observations/Objective: No vital signs or physical exam conducted as visit was done via telephone  Assessment and Plan: 1. Chronic respiratory failure with hypoxia (HCC); 6. Dependence on supplemental oxygen therapy Discussed with patient's the importance of oxygen therapy and keeping scheduled follow-up with pulmonology status post hospitalization for acute respiratory failure.  Also discussed with the patient that at the time of her hospital discharge her dose of trazodone was prescribed at 50 mg with discontinuation of Xanax and tramadol due to potential for sedation/decreasing respiratory drive.  Discussed with patient that she was likely allowed to continue the medications during her hospitalization as she was on oxygen as well as BiPAP and being monitored closely.  Patient had called a few days prior to today's visit and a prescription was sent to her pharmacy at that time for trazodone 50 mg and she is aware that this will not be increased at this time and that she does need to follow-up with pulmonology as well as have sleep study to obtain BiPAP as per hospital recommendations.  Nurse case manager had previously spoken with the patient regarding BiPAP as patient per hospital discharge summary indicated that she would obtain BiPAP on her own by renting a BiPAP machine.  Referral placed to pulmonology to a sleep medicine specialist to discuss sleep study with the patient and see if patient can have home sleep study done however I discussed with the patient that with her history of chronic respiratory failure that the safest place for her to have a sleep study would be in a monitored sleep center.  She is to reschedule her prior pulmonary appointment for follow-up of chronic respiratory failure and asthma which she was supposed to attend status post hospital follow-up.  Referral also placed to social worker as patient with  complaints of difficulty obtaining transportation for medical visits in addition to her complaints of inability to leave her apartment due to her knee pain. - Ambulatory referral to Social Work - Ambulatory referral to Pulmonology  2. Moderate persistent asthma without complication Reschedule follow-up appointment with pulmonology as per hospital discharge.  New prescription provided for Advair.  Smoking cessation encouraged. - Ambulatory referral to Pulmonology - Fluticasone-Salmeterol (ADVAIR) 500-50 MCG/DOSE AEPB; Inhale 1 puff into the lungs 2 (two) times daily.  Dispense: 60 each; Refill: 5  3. Chronic pain of right knee New referral was placed to orthopedics and patient can also call the orthopedic office regarding appointment but I am not sure if the prior office will see patient as she has not kept  prior follow-up appointments.  Referral placed to social work as patient with complaint of difficulty with transportation, I am not sure patient may be eligible for service such as SCAT.  Patient continuously request tramadol as she states that she cannot otherwise navigate the stairs at her home to get downstairs to her husband's or son's car or to get other transportation in order to attend orthopedic follow-up of her knee pain.  Prescription is sent into [facility] for 1 tramadol for patient to take so that she can go to her orthopedic appointment. - Ambulatory referral to Social Work - Ambulatory referral to Orthopedic Surgery - traMADol (ULTRAM) 50 MG tablet; Take one pill prior to upcoming appointment with Orthopedics  Dispense: 1 tablet; Refill: 0  4. Fibromyalgia Discussed with the patient that her gabapentin dose was decreased at the time of her hospital discharge 200 mg 3 times daily and the new prescription was sent in for refill of this medicine when she called last week regarding refill.  Medication dosages not being increased at this time as the medication can cause  sedation and she has not yet followed up with pulmonology regarding her chronic respiratory failure or asthma and has not yet obtained BiPAP for treatment of her sleep apnea.  5. Sleep disturbance Patient is being referred to a sleep specialist at pulmonology in follow-up of her obstructive sleep apnea and respiratory failure as patient refuses to have sleep study at the sleep medicine facility at [facility] after she was contacted for an appointment to schedule her sleep study.  She is provided with 50 mg of trazodone and she has been made aware that dose will not be increased due to her current respiratory issues as the medication can cause sedation and decreased respiratory drive. - Ambulatory referral to Pulmonology - traZODone (DESYREL) 50 MG tablet; Take 1 tablet (50 mg total) by mouth at bedtime as needed for sleep.  Dispense: 30 tablet; Refill: 0  7. Noncompliance Referral to social work for resources to help patient increase compliance with follow-up appointments, medications and any other resources that may be helpful.  She has had prior social work referral and patient reports that she does now have upcoming mental health appointment by video after missing numerous prior appointments. - Ambulatory referral to Social Work  Follow Up Instructions:Return in about 6 weeks (around 06/23/2019) for Chronic issues-keep scheduled specialty appointments.    I discussed the assessment and treatment plan with the patient. The patient was provided an opportunity to ask questions and all were answered. The patient agreed with the plan and demonstrated an understanding of the instructions.   The patient was advised to call back or seek an in-person evaluation if the symptoms worsen or if the condition fails to improve as anticipated.  I provided 26 minutes of non-face-to-face time during this encounter.  Additional 16 minutes was spent on review of chart, completion of today's note as well as  placement of referrals.  Antony Blackbird, MD

## 2019-05-12 NOTE — Progress Notes (Signed)
Patient verified DOB Patient has taken medication today. Patient has not eaten today. Pain is scaled at a 9 in the left knee and lower back which is chronic pain. Patient complains of frontal HA during the weekend.  Tylenol is not helping the HA Patient states the reduced gabapentin dose does not address nerve pain Patient is requesting refills on trazodone and using [facility] for advair.

## 2019-05-12 NOTE — Telephone Encounter (Signed)
Home Health has not been able to contact pt. Every time they attempt to visit pt she reschedules or cancels.

## 2019-05-13 ENCOUNTER — Encounter: Payer: Self-pay | Admitting: Family Medicine

## 2019-05-14 ENCOUNTER — Telehealth: Payer: Self-pay | Admitting: Family Medicine

## 2019-05-14 NOTE — Telephone Encounter (Signed)
Patient called and requested to speak with pcp regarding her cellulitis. Patient stated that both of her feet are red and it goes up about 4 inches. Patient stated that her R leg is red all over her knee and foot due to her brace. Patient stated that it is going up and down her leg. Patient stated today it had turned into a scab and she was told per her last ED visit that if it scabs she needs an antibiotic ASAP. Please fu at your earliest convenience.

## 2019-05-14 NOTE — Telephone Encounter (Signed)
Attempt to call patient to inform of message from Dr. Jillyn Hidden. No answer and unable to leave a voicemail.

## 2019-05-14 NOTE — Telephone Encounter (Signed)
Please advise patient to call 911 for transport to the hospital if she believes that she has had recurrence of her cellulitis as she required hospitalization for her last episode of cellulitis

## 2019-05-16 NOTE — Telephone Encounter (Signed)
Left message on voicemail to return call. Advised to go to ED if having concerns.

## 2019-05-17 ENCOUNTER — Other Ambulatory Visit: Payer: Self-pay | Admitting: Family Medicine

## 2019-05-17 DIAGNOSIS — J4520 Mild intermittent asthma, uncomplicated: Secondary | ICD-10-CM

## 2019-05-21 ENCOUNTER — Encounter: Payer: Self-pay | Attending: Physical Medicine and Rehabilitation | Admitting: Physical Medicine and Rehabilitation

## 2019-06-02 ENCOUNTER — Encounter: Payer: Self-pay | Admitting: Physical Medicine and Rehabilitation

## 2019-06-09 ENCOUNTER — Other Ambulatory Visit: Payer: Self-pay | Admitting: Family Medicine

## 2019-06-09 DIAGNOSIS — G479 Sleep disorder, unspecified: Secondary | ICD-10-CM

## 2019-06-25 ENCOUNTER — Encounter: Payer: Self-pay | Attending: Physical Medicine and Rehabilitation | Admitting: Physical Medicine and Rehabilitation

## 2019-06-26 ENCOUNTER — Other Ambulatory Visit: Payer: Self-pay | Admitting: Family Medicine

## 2019-06-26 DIAGNOSIS — M797 Fibromyalgia: Secondary | ICD-10-CM

## 2019-06-26 DIAGNOSIS — G479 Sleep disorder, unspecified: Secondary | ICD-10-CM

## 2019-06-26 DIAGNOSIS — F411 Generalized anxiety disorder: Secondary | ICD-10-CM

## 2019-06-26 MED ORDER — DULOXETINE HCL 60 MG PO CPEP: 60.0000 mg | ORAL_CAPSULE | Freq: Every day | ORAL | 2 refills | Status: DC

## 2019-06-26 MED ORDER — TRAZODONE HCL 50 MG PO TABS: ORAL_TABLET | ORAL | 2 refills | Status: DC

## 2019-06-26 MED ORDER — GABAPENTIN 100 MG PO CAPS: 100.0000 mg | ORAL_CAPSULE | Freq: Three times a day (TID) | ORAL | 2 refills | Status: DC

## 2019-06-26 MED ORDER — BUSPIRONE HCL 5 MG PO TABS: 5.0000 mg | ORAL_TABLET | Freq: Three times a day (TID) | ORAL | 2 refills | Status: DC

## 2019-06-26 NOTE — Telephone Encounter (Signed)
1) Medication(s) Requested (by name): traZODone (DESYREL) 50 MG tablet  DULoxetine (CYMBALTA) 60 MG capsule  busPIRone (BUSPAR) 5 MG tablet gabapentin (NEURONTIN) 100 MG capsule   2) Pharmacy of Choice:[facility] [address] - [place], [place] - [address]   3) Special Requests:   Approved medications will be sent to the pharmacy, we will reach out if there is an issue.  Requests made after 3pm may not be addressed until the following business day!  If a patient is unsure of the name of the medication(s) please note and ask patient to call back when they are able to provide all info, do not send to responsible party until all information is available!

## 2019-06-30 ENCOUNTER — Other Ambulatory Visit: Payer: Self-pay

## 2019-06-30 ENCOUNTER — Ambulatory Visit: Payer: Self-pay | Attending: Family Medicine | Admitting: Family Medicine

## 2019-06-30 DIAGNOSIS — Z91199 Patient's noncompliance with other medical treatment and regimen due to unspecified reason: Secondary | ICD-10-CM

## 2019-06-30 DIAGNOSIS — Z5329 Procedure and treatment not carried out because of patient's decision for other reasons: Secondary | ICD-10-CM

## 2019-07-02 ENCOUNTER — Telehealth: Payer: Self-pay | Admitting: Licensed Clinical Social Worker

## 2019-07-02 NOTE — Telephone Encounter (Signed)
Call placed to patient regarding [facility] referral. LCSW left message requesting a return call.  

## 2019-07-03 ENCOUNTER — Other Ambulatory Visit: Payer: Self-pay | Admitting: Family Medicine

## 2019-07-03 DIAGNOSIS — Z8739 Personal history of other diseases of the musculoskeletal system and connective tissue: Secondary | ICD-10-CM

## 2019-07-22 ENCOUNTER — Encounter: Payer: Self-pay | Attending: Physical Medicine and Rehabilitation | Admitting: Physical Medicine and Rehabilitation

## 2019-07-23 ENCOUNTER — Telehealth: Payer: Self-pay | Admitting: Licensed Clinical Social Worker

## 2019-07-23 NOTE — Telephone Encounter (Signed)
Call placed to patient regarding [facility] referral. LCSW left message requesting a return call.  

## 2019-07-29 ENCOUNTER — Ambulatory Visit (HOSPITAL_BASED_OUTPATIENT_CLINIC_OR_DEPARTMENT_OTHER): Payer: Self-pay | Admitting: Family

## 2019-07-29 NOTE — Progress Notes (Signed)
Patient did not show for appointment.   

## 2019-07-31 ENCOUNTER — Ambulatory Visit: Payer: Self-pay | Attending: Family | Admitting: Family

## 2019-07-31 ENCOUNTER — Other Ambulatory Visit: Payer: Self-pay

## 2019-07-31 DIAGNOSIS — Z5329 Procedure and treatment not carried out because of patient's decision for other reasons: Secondary | ICD-10-CM

## 2019-07-31 NOTE — Progress Notes (Signed)
Patient did not answer phone for telemedicine visit. I called patient at 1:52 pm, 2:06 pm, and 2:22 pm with no response and patient's voicemail was full.

## 2019-08-04 ENCOUNTER — Other Ambulatory Visit: Payer: Self-pay

## 2019-08-04 ENCOUNTER — Ambulatory Visit: Payer: Self-pay | Attending: Family Medicine | Admitting: Family Medicine

## 2019-08-08 ENCOUNTER — Emergency Department (HOSPITAL_COMMUNITY): Payer: BLUE CROSS/BLUE SHIELD

## 2019-08-08 ENCOUNTER — Emergency Department (HOSPITAL_COMMUNITY)
Admission: EM | Admit: 2019-08-08 | Discharge: 2019-08-08 | Disposition: A | Discharge status: Home / Self Care | Payer: BLUE CROSS/BLUE SHIELD | Attending: Emergency Medicine | Admitting: Emergency Medicine

## 2019-08-08 ENCOUNTER — Other Ambulatory Visit: Payer: Self-pay

## 2019-08-08 DIAGNOSIS — Y939 Activity, unspecified: Secondary | ICD-10-CM | POA: Diagnosis not present

## 2019-08-08 DIAGNOSIS — W010XXA Fall on same level from slipping, tripping and stumbling without subsequent striking against object, initial encounter: Secondary | ICD-10-CM | POA: Insufficient documentation

## 2019-08-08 DIAGNOSIS — Y999 Unspecified external cause status: Secondary | ICD-10-CM | POA: Insufficient documentation

## 2019-08-08 DIAGNOSIS — B354 Tinea corporis: Secondary | ICD-10-CM | POA: Insufficient documentation

## 2019-08-08 DIAGNOSIS — S8391XA Sprain of unspecified site of right knee, initial encounter: Secondary | ICD-10-CM | POA: Diagnosis not present

## 2019-08-08 DIAGNOSIS — Z96652 Presence of left artificial knee joint: Secondary | ICD-10-CM | POA: Diagnosis not present

## 2019-08-08 DIAGNOSIS — Y929 Unspecified place or not applicable: Secondary | ICD-10-CM | POA: Insufficient documentation

## 2019-08-08 DIAGNOSIS — I10 Essential (primary) hypertension: Secondary | ICD-10-CM | POA: Diagnosis not present

## 2019-08-08 DIAGNOSIS — J45909 Unspecified asthma, uncomplicated: Secondary | ICD-10-CM | POA: Diagnosis not present

## 2019-08-08 DIAGNOSIS — Z79899 Other long term (current) drug therapy: Secondary | ICD-10-CM | POA: Insufficient documentation

## 2019-08-08 DIAGNOSIS — S8991XA Unspecified injury of right lower leg, initial encounter: Secondary | ICD-10-CM | POA: Diagnosis present

## 2019-08-08 LAB — CBC WITH DIFFERENTIAL/PLATELET
Abs Immature Granulocytes: 0.07 10*3/uL (ref 0.00–0.07)
Basophils Absolute: 0 10*3/uL (ref 0.0–0.1)
Basophils Relative: 0 %
Eosinophils Absolute: 0.2 10*3/uL (ref 0.0–0.5)
Eosinophils Relative: 2 %
HCT: 39.9 % (ref 36.0–46.0)
Hemoglobin: 11.5 g/dL — ABNORMAL LOW (ref 12.0–15.0)
Immature Granulocytes: 1 %
Lymphocytes Relative: 14 %
Lymphs Abs: 1.5 10*3/uL (ref 0.7–4.0)
MCH: 27.6 pg (ref 26.0–34.0)
MCHC: 28.8 g/dL — ABNORMAL LOW (ref 30.0–36.0)
MCV: 95.9 fL (ref 80.0–100.0)
Monocytes Absolute: 0.8 10*3/uL (ref 0.1–1.0)
Monocytes Relative: 7 %
Neutro Abs: 7.7 10*3/uL (ref 1.7–7.7)
Neutrophils Relative %: 76 %
Platelets: 255 10*3/uL (ref 150–400)
RBC: 4.16 MIL/uL (ref 3.87–5.11)
RDW: 13.1 % (ref 11.5–15.5)
WBC: 10.1 10*3/uL (ref 4.0–10.5)
nRBC: 0 % (ref 0.0–0.2)

## 2019-08-08 LAB — BASIC METABOLIC PANEL
Anion gap: 8 (ref 5–15)
BUN: 11 mg/dL (ref 8–23)
CO2: 31 mmol/L (ref 22–32)
Calcium: 10.2 mg/dL (ref 8.9–10.3)
Chloride: 98 mmol/L (ref 98–111)
Creatinine, Ser: 1.07 mg/dL — ABNORMAL HIGH (ref 0.44–1.00)
GFR calc Af Amer: >60 mL/min (ref >60)
GFR calc non Af Amer: 56 mL/min — ABNORMAL LOW (ref >60)
Glucose, Bld: 110 mg/dL — ABNORMAL HIGH (ref 70–99)
Potassium: 4 mmol/L (ref 3.5–5.1)
Sodium: 137 mmol/L (ref 135–145)

## 2019-08-08 MED ORDER — NYSTATIN 100000 UNIT/GM EX POWD
1.0000 | Freq: Three times a day (TID) | CUTANEOUS | 0 refills | Status: DC
Start: 2019-08-08 — End: 2022-01-21

## 2019-08-08 MED ORDER — NYSTATIN 100000 UNIT/GM EX CREA
TOPICAL_CREAM | CUTANEOUS | 0 refills | Status: DC
Start: 2019-08-08 — End: 2019-08-25

## 2019-08-08 MED ORDER — FLUCONAZOLE 200 MG PO TABS
200.0000 mg | ORAL_TABLET | Freq: Once | ORAL | Status: AC
  Administered 2019-08-08: 10:00:00 200 mg via ORAL
  Filled 2019-08-08: qty 1

## 2019-08-08 MED ORDER — MELOXICAM 7.5 MG PO TABS
7.5000 mg | ORAL_TABLET | Freq: Every day | ORAL | 0 refills | Status: DC
Start: 2019-08-08 — End: 2019-08-11

## 2019-08-08 MED ORDER — NYSTATIN 100000 UNIT/GM EX POWD
Freq: Once | CUTANEOUS | Status: DC
  Filled 2019-08-08: qty 15

## 2019-08-08 MED ORDER — MELOXICAM 7.5 MG PO TABS
7.5000 mg | ORAL_TABLET | Freq: Once | ORAL | Status: AC
  Administered 2019-08-08: 7.5 mg via ORAL
  Filled 2019-08-08: qty 1

## 2019-08-08 NOTE — ED Triage Notes (Signed)
Pt arrived via [facility]. Pt was walking tonight and fell when her right knee gave way. Pt did not hit head, Pt also complains of rash around abdomen. Pt rates knee pain an 8 out of 10. Pt A&O x4

## 2019-08-08 NOTE — ED Notes (Signed)
Pt also complains of rash around abdomen

## 2019-08-08 NOTE — Discharge Instructions (Addendum)
You were seen today for knee pain. Your xray showed bad arthritis but no acute injury including no fracture or dislocation. Apply ice to your knee over your brace for 20 minutes at a time 2-3 times daily and continue to take tylenol as needed and your meloxicam prescription as needed. Do not exceed more than 3000mg  of tylenol in a 24 hour period. We have given you a prescription for a powder for a fungal infection. Apply this to your rash. Make sure to keep your skin clean and dry. We have also referred you to follow up with dermatology for your rash and ortho for your knee. Your labs were reassuring and we do not think you have a cellulitis at this time. Please see your primary doctor for follow up at the beginning of next week. Thank you for allowing me to care for you today. Please return to the emergency department if you have new or worsening symptoms. Take your medications as instructed.

## 2019-08-08 NOTE — ED Notes (Signed)
Pt intermittently yells out in pain from room, yet able to settle when nurse arrives.  Pt obese with noted breakdown to folds in legs and panus.  Quickly falls to sleep with nursing in the room.  Will place pure wick for urine sample.

## 2019-08-08 NOTE — ED Provider Notes (Addendum)
[name] EMERGENCY DEPARTMENT Provider Note   CSN: [id number] Arrival date & time: 08/08/19  0606     History Chief Complaint  Patient presents with  . Knee Pain    Janice Ryan is a 64 y.o. female.  Patient is a 64 year old female with past medical history of morbid obesity with a BMI of 89.5,  HTN, acute respiratory failure on chronic oxygen presenting to the ER for knee pain. Patient reports she is usually mobile with a cart at home. She has been up all night long "doing things" such as taxes. Also hasnt been sleeping well due to her mother passing away this week. She is actually supposed to go to the funeral home today to see her mother. Reports she got up to use the restroom and her right knee gave out on her. She has problems with this knee regularly. Also complains of rash. Reports rash under her breasts and in her groin. Reports redness to her BLE, worse on the right and is concerned she may have cellulitis again in this leg. Rash has been ongoing for months. It appears from previous notes she has been traeted for funga infection as well as lower extremity cellulitis. Denies fever, chills, n/v/d. Patient is falling asleep during my exam but reports it is because she has not slept and because she took motrin, tylenol and her gabapentin for her knee pain.         Past Medical History:  Diagnosis Date  . Acute respiratory failure with hypoxia (HCC)   . Asthma   . Hypertension   . Morbid obesity [facility])     Patient Active Problem List   Diagnosis Date Noted  . Major depressive disorder, recurrent episode, 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  . Cellulitis 03/21/2019  . Cellulitis of both lower extremities 03/20/2019  . Chronic respiratory failure with hypoxia (HCC) 12/10/2018  . HTN (hypertension) 12/10/2018  . Anxiety state 12/10/2018  . Chronic pain of right knee   . Moderate persistent asthma 07/28/2018    Past Surgical History:  Procedure Laterality  Date  . HERNIA REPAIR    . JOINT REPLACEMENT Left    knee     OB History   No obstetric history on file.     Family History  Problem Relation Age of Onset  . Atrial fibrillation Mother   . Congestive Heart Failure Father     Social History   Tobacco Use  . Smoking status: Never Smoker  . Smokeless tobacco: Never Used  Substance Use Topics  . Alcohol use: Never  . Drug use: Never    Home Medications Prior to Admission medications   Medication Sig Start Date End Date Taking? Authorizing Provider  albuterol (PROVENTIL) (2.5 MG/3ML) 0.083% nebulizer solution Take 3 mLs (2.5 mg total) by nebulization every 6 (six) hours as needed for wheezing or shortness of breath. 12/05/18  Yes Fulp, Cammie, MD  albuterol (VENTOLIN HFA) 108 (90 Base) MCG/ACT inhaler Inhale 2 puffs into the lungs every 6 (six) hours as needed for wheezing or shortness of breath. 03/18/19  Yes Newlin, Odette Horns, MD  busPIRone (BUSPAR) 5 MG tablet Take 1 tablet (5 mg total) by mouth 3 (three) times daily. 06/26/19  Yes Fulp, Cammie, MD  Cholecalciferol (VITAMIN D3 PO) Take 1 tablet by mouth daily.   Yes [provider]  diclofenac Sodium (VOLTAREN) 1 % GEL Apply 2 g topically 4 (four) times daily as needed. 04/02/19  Yes Burnadette Pop, MD  DULoxetine (CYMBALTA) 60 MG  capsule Take 1 capsule (60 mg total) by mouth daily. Patient taking differently: Take 120 mg by mouth daily.  06/26/19  Yes Fulp, Cammie, MD  Ferrous Sulfate (IRON PO) Take 1 tablet by mouth every other day.   Yes [provider]  gabapentin (NEURONTIN) 100 MG capsule Take 1 capsule (100 mg total) by mouth 3 (three) times daily. 06/26/19  Yes Fulp, Cammie, MD  lisinopril (ZESTRIL) 20 MG tablet Take 1 tablet (20 mg total) by mouth daily. 04/03/19  Yes Adhikari, Willia Craze, MD  montelukast (SINGULAIR) 10 MG tablet TAKE ONE TABLET BY MOUTH EVERY NIGHT AT BEDTIME Patient taking differently: Take 10 mg by mouth at bedtime.  05/19/19  Yes Fulp,  Cammie, MD  Multiple Vitamin (MULTIVITAMIN WITH MINERALS) TABS tablet Take 1 tablet by mouth daily.   Yes [provider]  traMADol (ULTRAM) 50 MG tablet Take one pill prior to upcoming appointment with Orthopedics Patient taking differently: Take 50 mg by mouth every 6 (six) hours as needed for moderate pain or severe pain.  05/12/19  Yes Fulp, Cammie, MD  traZODone (DESYREL) 50 MG tablet TAKE ONE TABLET BY MOUTH EVERY NIGHT AT BEDTIME AS NEEDED FOR SLEEP Patient taking differently: Take 50 mg by mouth at bedtime as needed for sleep.  06/26/19  Yes Fulp, Cammie, MD  Fluticasone-Salmeterol (ADVAIR) 500-50 MCG/DOSE AEPB Inhale 1 puff into the lungs 2 (two) times daily. 05/12/19 06/11/19  Fulp, Cammie, MD  furosemide (LASIX) 20 MG tablet Take 1 tablet (20 mg total) by mouth daily. Patient not taking: Reported on 08/08/2019 11/25/18   Hoy Register, MD  meloxicam (MOBIC) 7.5 MG tablet Take 1 tablet (7.5 mg total) by mouth daily for 7 days. 08/08/19 08/15/19  Arlyn Dunning, PA-C  methocarbamol (ROBAXIN) 500 MG tablet Take 1 tablet (500 mg total) by mouth 2 (two) times daily. Patient not taking: Reported on 08/08/2019 03/18/19   Hoy Register, MD  nystatin (MYCOSTATIN/NYSTOP) powder Apply 1 application topically 3 (three) times daily. 08/08/19   Arlyn Dunning, PA-C  nystatin cream (MYCOSTATIN) Apply to affected area 2 times daily 08/08/19   Ronnie Doss A, PA-C  OXYGEN Inhale into the lungs at bedtime as needed (2 L).    [provider]    Allergies    Sulfa antibiotics  Review of Systems   Review of Systems  Constitutional: Negative for chills and fever.  HENT: Negative for congestion and sore throat.   Eyes: Negative for visual disturbance.  Respiratory: Negative for shortness of breath.   Cardiovascular: Positive for leg swelling (chronic).  Gastrointestinal: Negative for abdominal pain.  Genitourinary: Negative for dysuria.  Musculoskeletal: Positive for arthralgias and gait  problem. Negative for back pain.  Skin: Positive for color change and rash.  Neurological: Negative for dizziness, syncope and light-headedness.  Psychiatric/Behavioral: Positive for sleep disturbance.    Physical Exam Updated Vital Signs BP 138/78 (BP Location: Right Wrist)   Pulse 82   Temp 98.9 F (37.2 C) (Oral)   Resp 20   Ht 5\' 8"  (1.727 m)   SpO2 95%   BMI 89.56 kg/m   Physical Exam Vitals and nursing note reviewed.  Constitutional:      General: She is not in acute distress.    Appearance: Normal appearance. She is obese. She is not ill-appearing or diaphoretic.     Comments: Patient is nodding off during my exam but easily awakened.   HENT:     Head: Normocephalic and atraumatic.  Eyes:  Conjunctiva/sclera: Conjunctivae normal.  Pulmonary:     Effort: Pulmonary effort is normal.  Musculoskeletal:     Comments: Exam of knee is extremely difficult due to body habitus. No obvious deformity or signs of injury, diffusely tender. BLE edema. BLE erythema, worse on the right but there is no ulcerations, skin breakdown, purulence or other open wounds.   Skin:    General: Skin is dry.     Comments: Fungal appearing rash under breasts and inguinal folds as well as skin folds of her arms. No purulence.   Neurological:     Mental Status: She is alert and oriented to person, place, and time.  Psychiatric:        Mood and Affect: Mood normal.     ED Results / Procedures / Treatments   Labs (all labs ordered are listed, but only abnormal results are displayed) Labs Reviewed  BASIC METABOLIC PANEL - Abnormal; Notable for the following components:      Result Value   Glucose, Bld 110 (*)    Creatinine, Ser 1.07 (*)    GFR calc non Af Amer 56 (*)    All other components within normal limits  CBC WITH DIFFERENTIAL/PLATELET - Abnormal; Notable for the following components:   Hemoglobin 11.5 (*)    MCHC 28.8 (*)    All other components within normal limits  RAPID URINE  DRUG SCREEN, HOSP PERFORMED    EKG EKG Interpretation  Date/Time:  Friday Aug 08 2019 06:17:21 EDT Ventricular Rate:  83 PR Interval:    QRS Duration: 105 QT Interval:  378 QTC Calculation: 445 R Axis:   68 Text Interpretation: Sinus rhythm Low voltage, precordial leads Abnormal R-wave progression, early transition No significant change since last tracing Confirmed by Drema Pry [phone]) on 08/08/2019 6:34:02 AM   Radiology DG Knee 2 Views Right  Result Date: 08/08/2019 CLINICAL DATA:  Knee pain. EXAM: RIGHT KNEE - 1-2 VIEW COMPARISON:  03/30/2019. FINDINGS: Diffuse osteopenia. Severe tricompartment degenerative change, most prominent about the medial compartment. Similar findings noted on prior exam. No acute bony abnormality identified. IMPRESSION: Diffuse osteopenia. Severe tricompartment degenerative change. Degenerative changes most prominent about the medial compartment. Similar findings noted on prior exam. No acute abnormality identified. Electronically Signed   By: Maisie Fus  Register   On: 08/08/2019 08:06    Procedures Procedures (including critical care time)  Medications Ordered in ED Medications  fluconazole (DIFLUCAN) tablet 200 mg (has no administration in time range)  nystatin (MYCOSTATIN/NYSTOP) topical powder (has no administration in time range)  meloxicam (MOBIC) tablet 7.5 mg (has no administration in time range)    ED Course  I have reviewed the triage vital signs and the nursing notes.  Pertinent labs & imaging results that were available during my care of the patient were reviewed by me and considered in my medical decision making (see chart for details).  Clinical Course as of Aug 08 923  Fri Aug 08, 2019  3762 Patient presenting with knee pain after knee giving out on her this morning. She is morbidly obese. Her secondary complaint is a rash that has been going on for months on torso as well as her concern for possible returning cellulitis in her right  lower leg. Her third issue is that she is nodding off during exam and appears very drowsy but easily awakened. A&O x4. VSS.  EKG reassuring. Will obtain xray of her knee and labs. Patient reports she is not concerned about her drowsiness because "I was up  all night and I took my medicince".  Denies drug or alcohol use.    [KM]  0825 Xray showing stable severe tricompartmental osteoarthritis but no acute bony injury. Labs reassuring without increased WBC count to suggest cellulitis. Given her physical exam I think her skin changes are chronic and will not start her on abx. She can f/u with her PMD. Advised RICE treatment and continue tylenol, for pain. She requested meloxicam as well. Patient is also now less drowsy and stable for d/c. Will refer to dermatology given her fungal rash is diffuse. Also refer to ortho at patient's request for her knee. Unsure what further treatments can be offered with her weight.   [KM]    Clinical Course User Index [KM] Kristine Royal   MDM Rules/Calculators/A&P                      Based on review of vitals, medical screening exam, lab work and/or imaging, there does not appear to be an acute, emergent etiology for the patient's symptoms. Counseled pt on good return precautions and encouraged both PCP and ED follow-up as needed.  Prior to discharge, I also discussed incidental imaging findings with patient in detail and advised appropriate, recommended follow-up in detail.  Clinical Impression: 1. Sprain of right knee, unspecified ligament, initial encounter   2. Tinea corporis     Disposition: Discharge  Prior to providing a prescription for a controlled substance, I independently reviewed the patient's recent prescription history on the [facility]. The patient had no recent or regular prescriptions and was deemed appropriate for a brief, less than 3 day prescription of narcotic for acute analgesia.  This note  was prepared with assistance of Systems analyst. Occasional wrong-word or sound-a-like substitutions may have occurred due to the inherent limitations of voice recognition software.  Final Clinical Impression(s) / ED Diagnoses Final diagnoses:  Sprain of right knee, unspecified ligament, initial encounter  Tinea corporis    Rx / DC Orders ED Discharge Orders         Ordered    nystatin (MYCOSTATIN/NYSTOP) powder  3 times daily     08/08/19 0857    meloxicam (MOBIC) 7.5 MG tablet  Daily     08/08/19 0924    nystatin cream (MYCOSTATIN)     08/08/19 0924           Alveria Apley, PA-C 08/08/19 0858    Alveria Apley, PA-C 08/08/19 0272    Wyvonnia Dusky, MD 08/08/19 [phone]

## 2019-08-11 ENCOUNTER — Ambulatory Visit: Payer: BLUE CROSS/BLUE SHIELD | Attending: Family Medicine | Admitting: Family Medicine

## 2019-08-11 ENCOUNTER — Encounter: Payer: Self-pay | Admitting: Family Medicine

## 2019-08-11 ENCOUNTER — Other Ambulatory Visit: Payer: Self-pay

## 2019-08-11 DIAGNOSIS — M25561 Pain in right knee: Secondary | ICD-10-CM | POA: Diagnosis not present

## 2019-08-11 DIAGNOSIS — J4540 Moderate persistent asthma, uncomplicated: Secondary | ICD-10-CM

## 2019-08-11 DIAGNOSIS — Z09 Encounter for follow-up examination after completed treatment for conditions other than malignant neoplasm: Secondary | ICD-10-CM

## 2019-08-11 DIAGNOSIS — B354 Tinea corporis: Secondary | ICD-10-CM

## 2019-08-11 DIAGNOSIS — M545 Low back pain, unspecified: Secondary | ICD-10-CM

## 2019-08-11 DIAGNOSIS — J9611 Chronic respiratory failure with hypoxia: Secondary | ICD-10-CM | POA: Diagnosis not present

## 2019-08-11 DIAGNOSIS — G8929 Other chronic pain: Secondary | ICD-10-CM

## 2019-08-11 DIAGNOSIS — G479 Sleep disorder, unspecified: Secondary | ICD-10-CM

## 2019-08-11 NOTE — Progress Notes (Signed)
HFU   Rash all over her body cellulitis (burning itching)  Mother died   Need to discuss medications that she may can take.   Med refills (Trazodone, nystatin cream, nystatin powder, voltaren Gel)

## 2019-08-11 NOTE — Progress Notes (Signed)
Virtual Visit via Telephone Note  I connected with Janice Ryan on 08/11/19 at 11:10 AM EDT by telephone and verified that I am speaking with the correct person using two identifiers.   I discussed the limitations, risks, security and privacy concerns of performing an evaluation and management service by telephone and the availability of in person appointments. I also discussed with the patient that there may be a patient responsible charge related to this service. The patient expressed understanding and agreed to proceed.  Patient Location: Home Provider Location: [facility] office Others participating in call: call initiated by Guillermina City, RMA who then transferred the call to me   History of Present Illness:      64 yo female with multiple medical issues who is s/p ED visit on 08/08/2019 and patient reports that she was given a cream for yeast but she is concerned that the rash that she has might be cellulitis. She does feel that the meloxicam that was prescribed for pain is helping but she only received a 7 day supply.  She feels that the medication also helped with her chronic back pain.  Reports that her mother recently passed which has contributed to her feelings of anxiety and depression.  Patient reports difficulty with sleep and would like a refill of trazodone.  She continues to have difficulty with ambulation due to her chronic knee pain for which she was recently seen in the emergency department.  She is trying to get her apartment complex to let her  move to an apartment on the lower level without stairs.  She also reports that her asthma causes her to have shortness of breath when going up or down stairs to her current apartment.  She has not yet followed up with pulmonology regarding her chronic hypoxic respiratory failure and asthma.  She feels that her breathing is improved as her oxygen saturations are in the low 90s at rest without oxygen.  Past Medical History:    Diagnosis Date  . Acute respiratory failure with hypoxia (HCC)   . Asthma   . Hypertension   . Morbid obesity (HCC)     Past Surgical History:  Procedure Laterality Date  . HERNIA REPAIR    . JOINT REPLACEMENT Left    knee    Family History  Problem Relation Age of Onset  . Atrial fibrillation Mother   . Congestive Heart Failure Father     Social History   Tobacco Use  . Smoking status: Never Smoker  . Smokeless tobacco: Never Used  Substance Use Topics  . Alcohol use: Never  . Drug use: Never     Allergies  Allergen Reactions  . Sulfa Antibiotics Nausea And Vomiting and Other (See Comments)    Stomach pain       Observations/Objective: No vital signs or physical exam conducted as visit was done via telephone  Assessment and Plan: 1. Encounter for examination following treatment at hospital Notes from patient's emergency department visit at the [facility] emergency department on 08/08/2019 reviewed and discussed with patient at today's visit.  She was treated with meloxicam for sprain of the right knee.  Patient's rash appears to be primarily under her breast and in the skin folds as well as the groin.  She was prescribed fluconazole and nystatin powder as the rash appeared fungal in nature.  2. Chronic respiratory failure with hypoxia (HCC); 5. Moderate persistent asthma Discussed the importance of compliance with oxygen therapy as well as importance of follow-up  with pulmonology regarding chronic respiratory failure issues.  Note will be written regarding her chronic respiratory and musculoskeletal issues that make it difficult to live in an apartment that is not on the ground floor.  3. Chronic pain of right knee 4. Low back pain with radiation RX sent to patient's pharmacy for meloxicam refill - meloxicam (MOBIC) 7.5 MG tablet; Take 1 tablet (7.5 mg total) by mouth daily as needed for pain. Take after eating.  Dispense: 30 tablet; Refill: 4  6. Sleep  disturbance Refill provided for trazodone. She has been asked to make sure that she follows through with sleep study as per her last hospital discharge.   7. Tinea Corporis Refill provided for Nystatin cream and patient is encouraged to schedule an in-person visit for evaluation of rash which she believes may be cellulitis but per ED notes there was no evidence of cellulitis and patient does have chronic skin changes due to venous statis.    Follow Up Instructions:Return in about 12 weeks (around 11/03/2019) for chronic issues-sooner if needed; rash/cellulitis- schedule in-person visit.    I discussed the assessment and treatment plan with the patient. The patient was provided an opportunity to ask questions and all were answered. The patient agreed with the plan and demonstrated an understanding of the instructions.   The patient was advised to call back or seek an in-person evaluation if the symptoms worsen or if the condition fails to improve as anticipated.  I provided 12 minutes of non-face-to-face time during this encounter.   Antony Blackbird, MD

## 2019-08-12 ENCOUNTER — Telehealth: Payer: Self-pay | Admitting: Family Medicine

## 2019-08-12 MED ORDER — MELOXICAM 7.5 MG PO TABS: 7.5000 mg | ORAL_TABLET | Freq: Every day | ORAL | 0 refills | Status: AC

## 2019-08-12 NOTE — Telephone Encounter (Signed)
UTR patient and VM is full

## 2019-08-12 NOTE — Telephone Encounter (Signed)
Patient called in and requested for listed medication to be refilled 30 day supply meloxicam (MOBIC) 7.5 MG tablet [[id number]]  [facility] [address] - [place], [place] - [address]  [address], [place] [address]  Phone:  [phone] Fax:  [phone]   Multiple disability's patient can no longer walk up and down the steps please follow up at your earliest convenience. Patient requested for the letter to be sent to patients email.  [email or web address]

## 2019-08-12 NOTE — Telephone Encounter (Signed)
The requested medication has been sent to patient's pharmacy and the letter she has requested is on your work station

## 2019-08-15 ENCOUNTER — Other Ambulatory Visit: Payer: Self-pay | Admitting: Family Medicine

## 2019-08-15 DIAGNOSIS — J4520 Mild intermittent asthma, uncomplicated: Secondary | ICD-10-CM

## 2019-08-18 ENCOUNTER — Telehealth: Payer: Self-pay

## 2019-08-18 ENCOUNTER — Telehealth: Payer: Self-pay | Admitting: *Deleted

## 2019-08-18 NOTE — Telephone Encounter (Signed)
Question about meloxicam instructions.  Also pt requires letter stating pt unable to walk stairs due to right knee tear / damage.  Call pt [phone]

## 2019-08-18 NOTE — Telephone Encounter (Signed)
I placed a letter at your work station for the above patient regarding her need for a first flor appt. When you contact her about the letter also see if she wants an in-person or video appt with someone this week about her rash   Spoke with patient and she is requesting that the letter be mailed out to her. Letter will be mailed out to patient with the address on file.

## 2019-08-19 NOTE — Telephone Encounter (Signed)
LMOM

## 2019-08-19 NOTE — Telephone Encounter (Signed)
Instructions were for patient to take Meloxicam 7.5 mg; one or two pills once per day as needed for pain. Letter was previously written for patient regarding the need for her to have a first level apartment due to her medical issues including her knee pain

## 2019-08-20 NOTE — Telephone Encounter (Signed)
LMOM

## 2019-08-25 ENCOUNTER — Encounter: Payer: Self-pay | Admitting: Family Medicine

## 2019-08-25 MED ORDER — NYSTATIN 100000 UNIT/GM EX CREA: TOPICAL_CREAM | CUTANEOUS | 3 refills | Status: DC

## 2019-08-25 MED ORDER — MELOXICAM 7.5 MG PO TABS: 7.5000 mg | ORAL_TABLET | Freq: Every day | ORAL | 4 refills | Status: DC

## 2019-08-25 MED ORDER — TRAZODONE HCL 50 MG PO TABS: ORAL_TABLET | ORAL | 2 refills | Status: DC

## 2019-09-26 ENCOUNTER — Telehealth: Payer: Self-pay | Admitting: Family Medicine

## 2019-09-26 DIAGNOSIS — M545 Low back pain, unspecified: Secondary | ICD-10-CM

## 2019-09-26 DIAGNOSIS — B354 Tinea corporis: Secondary | ICD-10-CM

## 2019-09-26 DIAGNOSIS — M25561 Pain in right knee: Secondary | ICD-10-CM

## 2019-09-26 MED ORDER — MELOXICAM 7.5 MG PO TABS: 7.5000 mg | ORAL_TABLET | Freq: Every day | ORAL | 4 refills | Status: DC

## 2019-09-26 MED ORDER — NYSTATIN 100000 UNIT/GM EX CREA: TOPICAL_CREAM | CUTANEOUS | 3 refills | Status: DC

## 2019-09-26 NOTE — Telephone Encounter (Signed)
Patient called in and requested for listed medications to be refilled and sent to [facility] on [address].  Patient requested for a pain medication for her knee pain.  meloxicam (MOBIC) 7.5 MG tablet [[id number]] nystatin cream (MYCOSTATIN) [[id number]]

## 2019-09-29 ENCOUNTER — Other Ambulatory Visit: Payer: Self-pay

## 2019-09-29 ENCOUNTER — Telehealth: Payer: Self-pay | Admitting: Family Medicine

## 2019-09-29 ENCOUNTER — Ambulatory Visit: Payer: BLUE CROSS/BLUE SHIELD | Admitting: Internal Medicine

## 2019-09-29 NOTE — Telephone Encounter (Signed)
Pt was call to inform her that, we need to reschedule the appt since the provider can not prescribe the medication she need and the sooner appt was on 10/14/19, Pt said that won't work for her and she is going to find another provider, Pt understood the appt will be cancel

## 2019-09-30 ENCOUNTER — Telehealth: Payer: BLUE CROSS/BLUE SHIELD | Admitting: Family

## 2019-10-21 DIAGNOSIS — M1711 Unilateral primary osteoarthritis, right knee: Secondary | ICD-10-CM | POA: Insufficient documentation

## 2019-10-24 ENCOUNTER — Other Ambulatory Visit: Payer: Self-pay | Admitting: Family Medicine

## 2019-10-24 DIAGNOSIS — F411 Generalized anxiety disorder: Secondary | ICD-10-CM

## 2019-10-24 DIAGNOSIS — M797 Fibromyalgia: Secondary | ICD-10-CM

## 2019-11-29 ENCOUNTER — Other Ambulatory Visit: Payer: Self-pay | Admitting: Family Medicine

## 2019-11-29 DIAGNOSIS — G479 Sleep disorder, unspecified: Secondary | ICD-10-CM

## 2019-11-29 NOTE — Telephone Encounter (Signed)
Requested Prescriptions  Pending Prescriptions Disp Refills   traZODone (DESYREL) 50 MG tablet [Pharmacy Med Name: traZODone 50 MG TABLET] 90 tablet 0    Sig: TAKE ONE TABLET BY MOUTH EVERY NIGHT AT BEDTIME AS NEEDED FOR SLEEP     Psychiatry: Antidepressants - Serotonin Modulator Passed - 11/29/2019 10:53 AM      Passed - Completed PHQ-2 or PHQ-9 in the last 360 days.      Passed - Valid encounter within last 6 months    Recent Outpatient Visits          3 months ago Encounter for examination following treatment at hospital   [facility] Fulp, [place], MD   4 months ago Erroneous encounter - disregard   [facility] Boomer, [place], NP   4 months ago Erroneous encounter - disregard   [facility] Carman, [place], NP   5 months ago No-show for appointment   [facility] Fulp, [place], MD   6 months ago Chronic respiratory failure with hypoxia [facility])   [facility] Cain Saupe, MD

## 2019-12-15 ENCOUNTER — Other Ambulatory Visit: Payer: Self-pay | Admitting: Nurse Practitioner

## 2019-12-15 DIAGNOSIS — J4540 Moderate persistent asthma, uncomplicated: Secondary | ICD-10-CM

## 2019-12-15 MED ORDER — ALBUTEROL SULFATE HFA 108 (90 BASE) MCG/ACT IN AERS: 2.0000 | INHALATION_SPRAY | Freq: Four times a day (QID) | RESPIRATORY_TRACT | 3 refills | Status: DC | PRN

## 2019-12-15 MED ORDER — FLUTICASONE-SALMETEROL 500-50 MCG/DOSE IN AEPB: 1.0000 | INHALATION_SPRAY | Freq: Two times a day (BID) | RESPIRATORY_TRACT | 5 refills | Status: DC

## 2019-12-15 NOTE — Telephone Encounter (Signed)
Medication Refill - Medication: albuterol (VENTOLIN HFA) 108 (90 Base) MCG/ACT inhaler  Fluticasone-Salmeterol (ADVAIR) 500-50 MCG/DOSE AEPB    Pt states that she is completely out of her supply, is scheduled for an appt in Oct. Needs medicine as soon as possible.   Has the patient contacted their pharmacy? Yes.   (Agent: If no, request that the patient contact the pharmacy for the refill.) (Agent: If yes, when and what did the pharmacy advise?)  Preferred Pharmacy (with phone number or street name):  [facility] [place], [place] - [address]  [address] [place] [address]  Phone: [phone] Fax: [phone]     Agent: Please be advised that RX refills may take up to 3 business days. We ask that you follow-up with your pharmacy.

## 2019-12-17 ENCOUNTER — Telehealth: Payer: Self-pay | Admitting: Family Medicine

## 2019-12-17 DIAGNOSIS — J4540 Moderate persistent asthma, uncomplicated: Secondary | ICD-10-CM

## 2019-12-17 MED ORDER — FLUTICASONE-SALMETEROL 500-50 MCG/DOSE IN AEPB: 1.0000 | INHALATION_SPRAY | Freq: Two times a day (BID) | RESPIRATORY_TRACT | 5 refills | Status: DC

## 2019-12-17 MED ORDER — ALBUTEROL SULFATE HFA 108 (90 BASE) MCG/ACT IN AERS: 2.0000 | INHALATION_SPRAY | Freq: Four times a day (QID) | RESPIRATORY_TRACT | 3 refills | Status: DC | PRN

## 2019-12-17 NOTE — Telephone Encounter (Signed)
Following medications sent to the wrong pharmacy.   Fluticasone-Salmeterol (ADVAIR) 500-50 MCG/DOSE AEPB  albuterol (VENTOLIN HFA) 108 (90 Base) MCG/ACT inhaler  Patient would like meds sent to [facility] on [address]

## 2019-12-17 NOTE — Telephone Encounter (Signed)
Done

## 2020-01-20 ENCOUNTER — Ambulatory Visit: Payer: BLUE CROSS/BLUE SHIELD | Admitting: Nurse Practitioner

## 2020-01-25 ENCOUNTER — Other Ambulatory Visit: Payer: Self-pay | Admitting: Family Medicine

## 2020-01-25 DIAGNOSIS — M797 Fibromyalgia: Secondary | ICD-10-CM

## 2020-01-25 DIAGNOSIS — F411 Generalized anxiety disorder: Secondary | ICD-10-CM

## 2020-01-25 NOTE — Telephone Encounter (Signed)
Requested Prescriptions  Pending Prescriptions Disp Refills   gabapentin (NEURONTIN) 100 MG capsule [Pharmacy Med Name: GABAPENTIN 100 MG CAPSULE] 90 capsule 1    Sig: TAKE ONE CAPSULE BY MOUTH THREE TIMES A DAY     Neurology: Anticonvulsants - gabapentin Passed - 01/25/2020 12:29 PM      Passed - Valid encounter within last 12 months    Recent Outpatient Visits          5 months ago Encounter for examination following treatment at hospital   [facility] [place], [place], MD   5 months ago Erroneous encounter - disregard   [facility] [place], [place], NP   6 months ago Erroneous encounter - disregard   [facility] [place], [place], NP   6 months ago No-show for appointment   [facility] [place], [place], MD   8 months ago Chronic respiratory failure with hypoxia (HCC)   [facility] [place], [place], MD              busPIRone (BUSPAR) 5 MG tablet [Pharmacy Med Name: busPIRone HCL 5 MG TABLET] 90 tablet 1    Sig: TAKE ONE TABLET BY MOUTH THREE TIMES A DAY     Psychiatry: Anxiolytics/Hypnotics - Non-controlled Passed - 01/25/2020 12:29 PM      Passed - Valid encounter within last 6 months    Recent Outpatient Visits          5 months ago Encounter for examination following treatment at hospital   [facility] [place], [place], MD   5 months ago Erroneous encounter - disregard   [facility] [place], [place], NP   6 months ago Erroneous encounter - disregard   [facility] [place], [place], NP   6 months ago No-show for appointment   [facility] [place], [place], MD   8 months ago Chronic respiratory failure with hypoxia [facility])    [facility] [place], MD

## 2020-06-15 ENCOUNTER — Ambulatory Visit: Payer: Self-pay | Admitting: *Deleted

## 2020-06-15 NOTE — Telephone Encounter (Signed)
Pt reports SOB, onset 2-3 days ago. States "I know this is an asthma attack and I won't go to ED, I just need my Pro Air called in and my nebs." Pt reports SOB at rest. Has home O2, states has not used in a while but did use last night, 2.5-4Ls. States O2 sats 81-82%. Advised ED, pt initially declined. Reiterated need for ED eval and assured PCP would recommend the same disposition. Pt then stated she would go to an UC. Triage incomplete as she stated she could not speak any longer. Again states she will go to UC, advised ED. Triage incomplete. Reason for Disposition . [1] MODERATE difficulty breathing (e.g., speaks in phrases, SOB even at rest, pulse 100-120) AND [2] NEW-onset or WORSE than normal  Answer Assessment - Initial Assessment Questions 1. RESPIRATORY STATUS: "Describe your breathing?" (e.g., wheezing, shortness of breath, unable to speak, severe coughing)      SOB with  2. ONSET: "When did this breathing problem begin?"      *No Answer* 3. PATTERN "Does the difficult breathing come and go, or has it been constant since it started?"      *No Answer* 4. SEVERITY: "How bad is your breathing?" (e.g., mild, moderate, severe)    - MILD: No SOB at rest, mild SOB with walking, speaks normally in sentences, can lay down, no retractions, pulse < 100.    - MODERATE: SOB at rest, SOB with minimal exertion and prefers to sit, cannot lie down flat, speaks in phrases, mild retractions, audible wheezing, pulse 100-120.    - SEVERE: Very SOB at rest, speaks in single words, struggling to breathe, sitting hunched forward, retractions, pulse > 120      *No Answer* 5. RECURRENT SYMPTOM: "Have you had difficulty breathing before?" If Yes, ask: "When was the last time?" and "What happened that time?"      *No Answer* 6. CARDIAC HISTORY: "Do you have any history of heart disease?" (e.g., heart attack, angina, bypass surgery, angioplasty)      *No Answer* 7. LUNG HISTORY: "Do you have any history of lung  disease?"  (e.g., pulmonary embolus, asthma, emphysema)     *No Answer* 8. CAUSE: "What do you think is causing the breathing problem?"      *No Answer* 9. OTHER SYMPTOMS: "Do you have any other symptoms? (e.g., dizziness, runny nose, cough, chest pain, fever)     *No Answer* 10. PREGNANCY: "Is there any chance you are pregnant?" "When was your last menstrual period?"       *No Answer* 11. TRAVEL: "Have you traveled out of the country in the last month?" (e.g., travel history, exposures)       *No Answer*  Protocols used: BREATHING DIFFICULTY-A-AH

## 2020-06-24 ENCOUNTER — Ambulatory Visit: Payer: BLUE CROSS/BLUE SHIELD | Admitting: Physician Assistant

## 2020-06-28 ENCOUNTER — Ambulatory Visit: Payer: Self-pay | Admitting: *Deleted

## 2020-06-28 NOTE — Telephone Encounter (Signed)
Pt requesting referral to "[facility] Pulmonology on [address]  Reports SOB somewhat worse, declines triage. "I know when I need to go to ED." States "I just need to see them to get my O2 straightened out." Assured pt NT would route to practice for PCPs review.  Had appt 06/24/20, no show. Please advise: [phone]  Reason for Disposition . [1] Caller requesting NON-URGENT health information AND [2] PCP's office is the best resource  Answer Assessment - Initial Assessment Questions 1. REASON FOR CALL or QUESTION: "What is your reason for calling today?" or "How can I best help you?" or "What question do you have that I can help answer?"     Referral request  Protocols used: INFORMATION ONLY CALL - NO TRIAGE-A-AH

## 2020-06-29 NOTE — Telephone Encounter (Signed)
Please schedule patient an appointment with any available PCP or refer to mobile unit.

## 2020-07-05 ENCOUNTER — Telehealth: Payer: Self-pay | Admitting: Family Medicine

## 2020-07-05 NOTE — Telephone Encounter (Signed)
Attempted to reach pt to offer the Mobile Health Bus Locations this week to be seen there or pt's earliest convenience. LVM to check with office what day works best for pt to be seen by [facility], before the scheduled appt at [facility].

## 2020-07-14 NOTE — Telephone Encounter (Signed)
Called patient and LVM advising I was calling from [facility] in regards to seeing if she was still needing an appointment. If so advised patient to call back. If patient calls back please schedule with any available provider or refer to mobile unit.   Today mobile will be located at [address] for walk-in visits from 2-6pm. Tomorrow mobile will be located at [address] [phone] from 9-12:15 and 2-6pm for walk-in visits.

## 2020-07-22 ENCOUNTER — Ambulatory Visit: Payer: Self-pay | Admitting: *Deleted

## 2020-07-22 NOTE — Telephone Encounter (Signed)
  Reason for Disposition . [1] Caller requesting NON-URGENT health information AND [2] PCP's office is the best resource  Answer Assessment - Initial Assessment Questions 1. REASON FOR CALL or QUESTION: "What is your reason for calling today?" or "How can I best help you?" or "What question do you have that I can help answer?"    Recent asthma attack and requesting pulmonary referral.  Protocols used: INFORMATION ONLY CALL - NO TRIAGE-A-AH

## 2020-07-22 NOTE — Progress Notes (Signed)
Virtual Visit via Telephone Note  I connected with Janice Ryan, on 07/23/2020 at 8:43 AM by telephone due to the COVID-19 pandemic and verified that I am speaking with the correct person using two identifiers.  Due to current restrictions/limitations of in-office visits due to the COVID-19 pandemic, this scheduled clinical appointment was converted to a telehealth visit.   Consent: I discussed the limitations, risks, security and privacy concerns of performing an evaluation and management service by telephone and the availability of in person appointments. I also discussed with the patient that there may be a patient responsible charge related to this service. The patient expressed understanding and agreed to proceed.  Location of Patient: Home  Location of Provider: [facility]  Persons participating in Telemedicine visit: Elka Satterfield Ricky Stabs, NP Margorie John, CMA  History of Present Illness: Janice Ryan is a 65 year-old female who presents to establish care. PMH significant for hypertension, asthma, chronic respiratory failure with hypoxia, anxiety state, chronic pain of right knee, major depressive disorder, and morbid obesity.   Current issues and/or concerns: Requesting referral to [facility] in [place], [place]. Reports admitted to the hospital several times for asthma. Using oxygen 2.5 to 3 liters as needed. Concern that the oxygen levels on the tank need to be reassessed. Overall doing better with breathing most days saturating 95% but some days in the 80's. Pollen making worse and requesting allergy referral . Endorses shortness of breath. Reports she has not been around anyone with Covid and usually staying home. Taking Proair inhaler, Advair inhaler, Albuterol nebulizer, and Montelukast. Endorses feeling stable with all medications.   Reports frequent visits to the urgent care for recurrent sinus  infections. Endorses headaches, frontal sinus pressure, dry mouth, sinus drainage, and itching ears. No fever.     Past Medical History:  Diagnosis Date  . Acute respiratory failure with hypoxia (HCC)   . Asthma   . Hypertension   . Morbid obesity (HCC)    Allergies  Allergen Reactions  . Sulfa Antibiotics Nausea And Vomiting and Other (See Comments)    Stomach pain    Current Outpatient Medications on File Prior to Visit  Medication Sig Dispense Refill  . albuterol (PROVENTIL) (2.5 MG/3ML) 0.083% nebulizer solution Take 3 mLs (2.5 mg total) by nebulization every 6 (six) hours as needed for wheezing or shortness of breath. 150 mL 11  . albuterol (VENTOLIN HFA) 108 (90 Base) MCG/ACT inhaler Inhale 2 puffs into the lungs every 6 (six) hours as needed for wheezing or shortness of breath. 18 g 3  . busPIRone (BUSPAR) 5 MG tablet TAKE ONE TABLET BY MOUTH THREE TIMES A DAY 90 tablet 1  . Cholecalciferol (VITAMIN D3 PO) Take 1 tablet by mouth daily.    . diclofenac Sodium (VOLTAREN) 1 % GEL Apply 2 g topically 4 (four) times daily as needed. (Patient not taking: Reported on 08/11/2019) 350 g 0  . DULoxetine (CYMBALTA) 60 MG capsule Take 1 capsule (60 mg total) by mouth daily. (Patient taking differently: Take 120 mg by mouth daily. ) 30 capsule 2  . Ferrous Sulfate (IRON PO) Take 1 tablet by mouth every other day.    . Fluticasone-Salmeterol (ADVAIR) 500-50 MCG/DOSE AEPB Inhale 1 puff into the lungs 2 (two) times daily. 60 each 5  . furosemide (LASIX) 20 MG tablet Take 1 tablet (20 mg total) by mouth daily. (Patient not taking: Reported on 08/08/2019) 30 tablet 1  . gabapentin (NEURONTIN) 100  MG capsule TAKE ONE CAPSULE BY MOUTH THREE TIMES A DAY 90 capsule 1  . lisinopril (ZESTRIL) 20 MG tablet Take 1 tablet (20 mg total) by mouth daily. 30 tablet 1  . meloxicam (MOBIC) 7.5 MG tablet Take 1 tablet (7.5 mg total) by mouth daily. As needed for pain; take after eating 30 tablet 4  . methocarbamol  (ROBAXIN) 500 MG tablet Take 1 tablet (500 mg total) by mouth 2 (two) times daily. (Patient not taking: Reported on 08/08/2019) 60 tablet 0  . montelukast (SINGULAIR) 10 MG tablet TAKE ONE TABLET BY MOUTH EVERY NIGHT AT BEDTIME 90 tablet 1  . Multiple Vitamin (MULTIVITAMIN WITH MINERALS) TABS tablet Take 1 tablet by mouth daily.    Marland Kitchen nystatin (MYCOSTATIN/NYSTOP) powder Apply 1 application topically 3 (three) times daily. (Patient not taking: Reported on 08/11/2019) 15 g 0  . nystatin cream (MYCOSTATIN) Apply to affected area 2 times daily as needed for fungal rash 30 g 3  . OXYGEN Inhale into the lungs at bedtime as needed (2 L).    . traZODone (DESYREL) 50 MG tablet TAKE ONE TABLET BY MOUTH EVERY NIGHT AT BEDTIME AS NEEDED FOR SLEEP 90 tablet 0   No current facility-administered medications on file prior to visit.    Observations/Objective: Alert and oriented x 3. Not in acute distress. Physical examination not completed as this is a telemedicine visit.  Assessment and Plan: 1. Encounter to establish care: - Patient presents today to establish care.  - Return for annual physical examination, labs, and health maintenance. Arrive fasting meaning having no for at least 8 hours prior to appointment. You may have only water or black coffee. Please take scheduled medications as normal.  2. Moderate persistent asthma, unspecified whether complicated: - Continue Fluticasone-Salmeterol inhaler, Albuterol inhaler, and Albuterol nebulizer as prescribed. - Per patient request referral to Pulmonology for further evaluation and management.  Location:  [facility]  Pulmonology - [address]  [address] [place], [place], [place] [address] Phone #: [phone] - Follow-up with primary provider as scheduled.  - Ambulatory referral to Pulmonology - albuterol (PROVENTIL) (2.5 MG/3ML) 0.083% nebulizer solution; Take 3 mLs (2.5 mg total) by nebulization every 6 (six) hours as needed for  wheezing or shortness of breath.  Dispense: 150 mL; Refill: 5 - albuterol (VENTOLIN HFA) 108 (90 Base) MCG/ACT inhaler; Inhale 2 puffs into the lungs every 6 (six) hours as needed for wheezing or shortness of breath.  Dispense: 18 g; Refill: 1 - Fluticasone-Salmeterol (ADVAIR) 500-50 MCG/DOSE AEPB; Inhale 1 puff into the lungs 2 (two) times daily.  Dispense: 60 each; Refill: 2  3. Chronic frontal sinusitis: -  Amoxicillin-Clavulanate as prescribed. - Follow-up with primary provider as scheduled.  - amoxicillin-clavulanate (AUGMENTIN) 875-125 MG tablet; Take 1 tablet by mouth 2 (two) times daily for 5 days.  Dispense: 10 tablet; Refill: 0  4. Environmental allergies: - Per patient request referral to Allergy for further evaluation and management. - Ambulatory referral to Allergy  5. Ceruminosis, bilateral: - Carbamide Peroxide otic solution as prescribed. Follow-up with primary provider as scheduled.  - carbamide peroxide (DEBROX) 6.5 % OTIC solution; Place 5 drops into both ears 2 (two) times daily. Do not use more than 4 days in a row.  Dispense: 15 mL; Refill: 0   Follow Up Instructions: Return for annual physical exam.   Patient was given clear instructions to go to Emergency Department or return to medical center if symptoms don't improve, worsen, or new  problems develop.The patient verbalized understanding.  I discussed the assessment and treatment plan with the patient. The patient was provided an opportunity to ask questions and all were answered. The patient agreed with the plan and demonstrated an understanding of the instructions.   The patient was advised to call back or seek an in-person evaluation if the symptoms worsen or if the condition fails to improve as anticipated.    I provided 20 minutes total of non-face-to-face time during this encounter including median intraservice time, reviewing previous notes, labs, imaging, medications, management and patient verbalized  understanding.    [name], NP  [facility] at [facility] [place], [place] [phone] 07/23/2020, 8:43 AM

## 2020-07-22 NOTE — Telephone Encounter (Signed)
No triage completed. Patient requesting referral only for recent asthma attack. She would like referral to [facility] in [place]. Currently scheduled for OV 09/14/20. Last 3 OV-no show.

## 2020-07-23 ENCOUNTER — Telehealth (INDEPENDENT_AMBULATORY_CARE_PROVIDER_SITE_OTHER): Payer: BLUE CROSS/BLUE SHIELD | Admitting: Family

## 2020-07-23 DIAGNOSIS — J321 Chronic frontal sinusitis: Secondary | ICD-10-CM

## 2020-07-23 DIAGNOSIS — Z9109 Other allergy status, other than to drugs and biological substances: Secondary | ICD-10-CM | POA: Diagnosis not present

## 2020-07-23 DIAGNOSIS — H6123 Impacted cerumen, bilateral: Secondary | ICD-10-CM

## 2020-07-23 DIAGNOSIS — J4540 Moderate persistent asthma, uncomplicated: Secondary | ICD-10-CM | POA: Diagnosis not present

## 2020-07-23 DIAGNOSIS — Z7689 Persons encountering health services in other specified circumstances: Secondary | ICD-10-CM

## 2020-07-23 MED ORDER — ALBUTEROL SULFATE HFA 108 (90 BASE) MCG/ACT IN AERS: 2.0000 | INHALATION_SPRAY | Freq: Four times a day (QID) | RESPIRATORY_TRACT | 1 refills | Status: DC | PRN

## 2020-07-23 MED ORDER — ALBUTEROL SULFATE (2.5 MG/3ML) 0.083% IN NEBU: 2.5000 mg | INHALATION_SOLUTION | Freq: Four times a day (QID) | RESPIRATORY_TRACT | 5 refills | Status: DC | PRN

## 2020-07-23 MED ORDER — AMOXICILLIN-POT CLAVULANATE 875-125 MG PO TABS: 1.0000 | ORAL_TABLET | Freq: Two times a day (BID) | ORAL | 0 refills | Status: AC

## 2020-07-23 MED ORDER — DEBROX 6.5 % OT SOLN: 5.0000 [drp] | Freq: Two times a day (BID) | OTIC | 0 refills | Status: DC

## 2020-07-23 MED ORDER — FLUTICASONE-SALMETEROL 500-50 MCG/DOSE IN AEPB: 1.0000 | INHALATION_SPRAY | Freq: Two times a day (BID) | RESPIRATORY_TRACT | 2 refills | Status: DC

## 2020-07-23 NOTE — Progress Notes (Signed)
Establish care  Needs referral to Pulmonology in [facility]

## 2020-08-02 ENCOUNTER — Telehealth: Payer: Self-pay | Admitting: Family

## 2020-08-02 NOTE — Telephone Encounter (Signed)
Pt states she needs [facility]  Pulmonology - [address] Address: [address] [place], [place] [address] Phone: [phone].  Pt also is asking for more antibiotics for her sinus issues. Stating she is still having sinus issues

## 2020-08-03 ENCOUNTER — Other Ambulatory Visit: Payer: Self-pay | Admitting: Family

## 2020-08-03 DIAGNOSIS — J321 Chronic frontal sinusitis: Secondary | ICD-10-CM

## 2020-08-03 MED ORDER — MOXIFLOXACIN HCL 400 MG PO TABS: 400.0000 mg | ORAL_TABLET | Freq: Every day | ORAL | 0 refills | Status: AC

## 2020-08-03 NOTE — Telephone Encounter (Signed)
Referral placed to [facility]  Pulmonology - [address]  [address], [place], [place] [address] as patient requested during office visit on 07/23/2020.  Today spoke with referral coordination, Cheryll Dessert, and confirmed appointment still pending.   Will try course of Moxifloxacin for recurrent sinusitis and sent to patient's pharmacy. Follow-up with provider as scheduled.

## 2020-09-14 ENCOUNTER — Telehealth: Payer: BLUE CROSS/BLUE SHIELD | Admitting: Nurse Practitioner

## 2021-01-04 IMAGING — CR DG KNEE 1-2V*R*
2 series · 2 of 2 positions shown · non-contrast
Comparison: 03/30/2019.

CLINICAL DATA: Knee pain.

EXAM:
RIGHT KNEE - 1-2 VIEW

[knee lat]
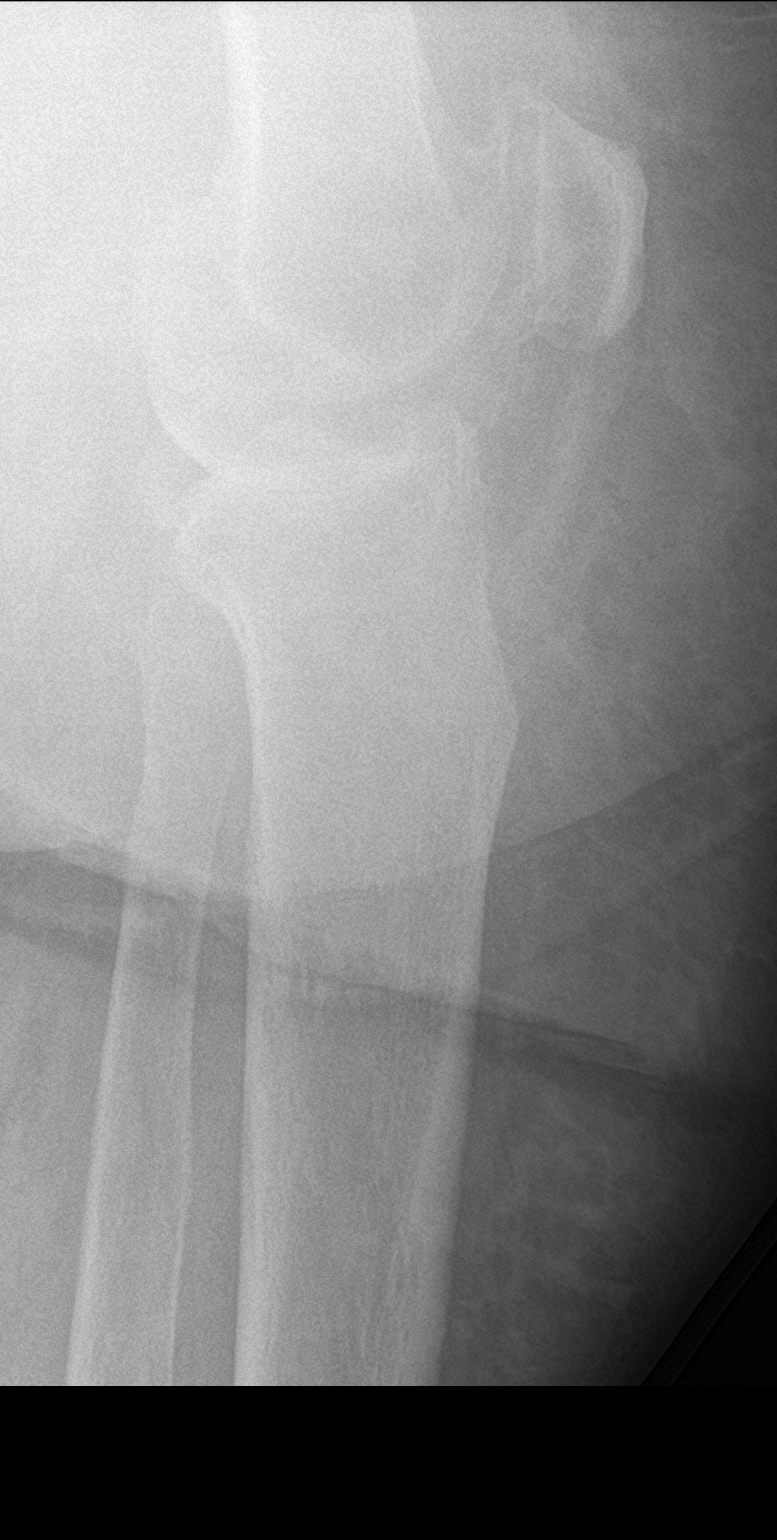

[knee ap]
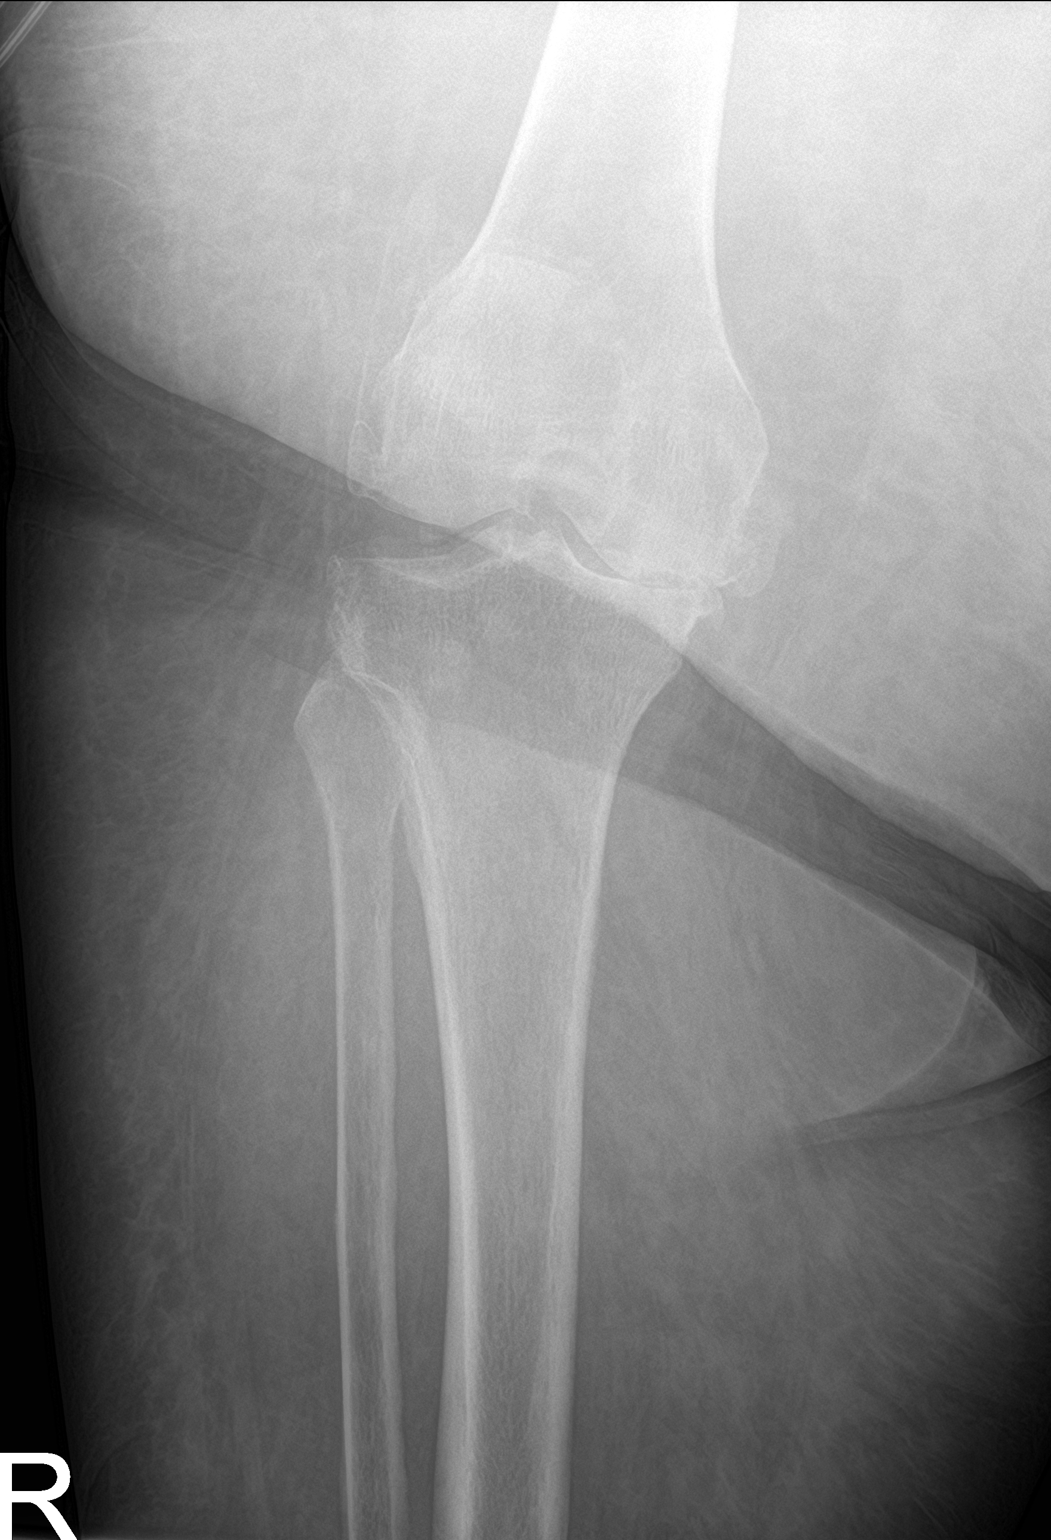

[2 of 2 positions shown; findings below may reference images not displayed]

FINDINGS: Diffuse osteopenia. Severe tricompartment degenerative change, most
prominent about the medial compartment. Similar findings noted on
prior exam. No acute bony abnormality identified.
IMPRESSION: Diffuse osteopenia. Severe tricompartment degenerative change.
Degenerative changes most prominent about the medial compartment.
Similar findings noted on prior exam. No acute abnormality
identified.

## 2021-02-02 ENCOUNTER — Telehealth (INDEPENDENT_AMBULATORY_CARE_PROVIDER_SITE_OTHER): Payer: Self-pay | Admitting: Nurse Practitioner

## 2021-02-02 ENCOUNTER — Telehealth: Payer: Self-pay | Admitting: Nurse Practitioner

## 2021-02-02 ENCOUNTER — Other Ambulatory Visit: Payer: Self-pay

## 2021-02-02 ENCOUNTER — Encounter: Payer: Self-pay | Admitting: Nurse Practitioner

## 2021-02-02 DIAGNOSIS — J4540 Moderate persistent asthma, uncomplicated: Secondary | ICD-10-CM

## 2021-02-02 MED ORDER — ERYTHROMYCIN 5 MG/GM OP OINT: 1.0000 "application " | TOPICAL_OINTMENT | Freq: Every day | OPHTHALMIC | 0 refills | Status: DC

## 2021-02-02 MED ORDER — ALBUTEROL SULFATE HFA 108 (90 BASE) MCG/ACT IN AERS: 2.0000 | INHALATION_SPRAY | Freq: Four times a day (QID) | RESPIRATORY_TRACT | 1 refills | Status: DC | PRN

## 2021-02-02 MED ORDER — ALBUTEROL SULFATE (2.5 MG/3ML) 0.083% IN NEBU: 2.5000 mg | INHALATION_SOLUTION | Freq: Four times a day (QID) | RESPIRATORY_TRACT | 5 refills | Status: DC | PRN

## 2021-02-02 NOTE — Progress Notes (Signed)
Virtual Visit via Telephone Note  I connected with Janice Ryan on 02/02/21 at  1:20 PM EDT by telephone and verified that I am speaking with the correct person using two identifiers.  Location: Patient: home Provider: office   I discussed the limitations, risks, security and privacy concerns of performing an evaluation and management service by telephone and the availability of in person appointments. I also discussed with the patient that there may be a patient responsible charge related to this service. The patient expressed understanding and agreed to proceed.   History of Present Illness:  Patient presents today for refills on her asthma medications.  This is a televisit.  Patient is needing her nebulizer treatment refill and her rescue inhaler.  She states that she is currently having right eye irritation.  She states that she has been having drainage out of her right.  She states it is red.  She states that her eyes are matted shut in the morning. Denies f/c/s, n/v/d, hemoptysis, PND, chest pain or edema.     Observations/Objective:  Vitals with BMI 08/08/2019 08/08/2019 08/08/2019  Height 5\' 8"  - -  Weight - - -  BMI - - -  Systolic - 138 138  Diastolic - 78 78  Pulse - 82 83      Assessment and Plan:  Conjunctivitis:  Will treat for conjunctivitis considering symptoms  Asthma:  Will refill albuterol nebulizer treatments and inhaler  Follow up:  Follow up as scheduled     I discussed the assessment and treatment plan with the patient. The patient was provided an opportunity to ask questions and all were answered. The patient agreed with the plan and demonstrated an understanding of the instructions.   The patient was advised to call back or seek an in-person evaluation if the symptoms worsen or if the condition fails to improve as anticipated.  I provided 23 minutes of non-face-to-face time during this encounter.   , NP

## 2021-02-02 NOTE — Telephone Encounter (Signed)
Attempted to contact patient for virtual visit - no answer x4 attempts and no available voicemail.

## 2021-02-02 NOTE — Patient Instructions (Signed)
Conjunctivitis:  Will treat for conjunctivitis considering symptoms  Asthma:  Will refill albuterol nebulizer treatments and inhaler  Follow up:  Follow up as scheduled

## 2021-02-10 ENCOUNTER — Other Ambulatory Visit: Payer: Self-pay

## 2021-02-10 ENCOUNTER — Telehealth: Payer: Self-pay

## 2021-02-10 DIAGNOSIS — I10 Essential (primary) hypertension: Secondary | ICD-10-CM

## 2021-02-10 MED ORDER — LISINOPRIL 20 MG PO TABS: 20.0000 mg | ORAL_TABLET | Freq: Every day | ORAL | 0 refills | Status: DC

## 2021-02-10 NOTE — Telephone Encounter (Signed)
Pt called in requesting a refill on her BP medication pt states she takes Lisinopril 20mg  one tab once daily, wants to use the on [facility] Pts last visit was on 02/02/21

## 2021-02-10 NOTE — Progress Notes (Signed)
Per patient request Lisinopril 20 mg refilled for courtesy 30 day supply. Please schedule appointment for additional refills

## 2021-04-25 ENCOUNTER — Other Ambulatory Visit: Payer: Self-pay | Admitting: Family

## 2021-04-25 ENCOUNTER — Ambulatory Visit: Payer: Self-pay

## 2021-04-25 DIAGNOSIS — I10 Essential (primary) hypertension: Secondary | ICD-10-CM

## 2021-04-25 DIAGNOSIS — G479 Sleep disorder, unspecified: Secondary | ICD-10-CM

## 2021-04-25 MED ORDER — LISINOPRIL 20 MG PO TABS: 20.0000 mg | ORAL_TABLET | Freq: Every day | ORAL | 0 refills | Status: DC

## 2021-04-25 NOTE — Telephone Encounter (Signed)
Pt called saying she has a possible sinus infection and wants to either get an antibiotic or see [name] Zonia Kief   Left message to call back.

## 2021-04-25 NOTE — Telephone Encounter (Signed)
Call patient and make aware of following. Report to Urgent Care/Emergency Department for severe facial pain. Follow-up with me after discharge.   A 30 day courtesy refill of Lisinopril sent to preferred pharmacy. Schedule appointment for additional refills. Please note in past patient only had telemedicine appointments. Patient may benefit from in-person appointment.

## 2021-04-25 NOTE — Telephone Encounter (Signed)
°  Chief Complaint: sinus pain and pressure Symptoms: yellow nasal phlegm, hoarse, right side of jaw clicking. Wheezing, facial pain. Pain 8/10 facial pain, sore throat, swelling around right eye, fever to 101.9, occasional right ear ache Frequency: worsening of sinus pain  Pertinent Negatives: Patient denies chest pain,  Disposition: [] ED /[] Urgent Care (no appt availability in office) / [] Appointment(In office/virtual)/ []  [facility]/ [] Home Care/ [] Refused Recommended Disposition /[] [place] [facility]/ [x]  Follow-up with PCP Additional Notes: routed to at [place].  Pt asking for anything that will help. Pt asking for Trazodone, and lisinopril. Pt stated she needed to hang up. Unable to go iver care advice.      Reason for Disposition  Face pain present > 24 hours  Answer Assessment - Initial Assessment Questions 1. ONSET: "When did the pain start?" (e.g., minutes, hours, days)     *No Answer* 2. ONSET: "Does the pain come and go, or has it been constant since it started?" (e.g., constant, intermittent, fleeting)     constant 3. SEVERITY: "How bad is the pain?"   (Scale 1-10; mild, moderate or severe)   - MILD (1-3): doesn't interfere with normal activities    - MODERATE (4-7): interferes with normal activities or awakens from sleep    - SEVERE (8-10): excruciating pain, unable to do any normal activities      8/10 4. LOCATION: "Where does it hurt?"      Nose ears sore throat on right side 5. RASH: "Is there any redness, rash, or swelling of the face?"     Swelling right eye 6. FEVER: "Do you have a fever?" If Yes, ask: "What is it, how was it measured, and when did it start?"      101.9 7. OTHER SYMPTOMS: "Do you have any other symptoms?" (e.g., fever, toothache, nasal discharge, nasal congestion, clicking sensation in jaw joint)     Fever, right clicking sensation to right jaw and nasal discharge, nasal congestion yellow phlegm, wheezing  more  Protocols used: Face Pain-A-AH

## 2021-04-26 ENCOUNTER — Telehealth: Payer: Self-pay

## 2021-04-26 NOTE — Telephone Encounter (Signed)
Att to contact pt to schedule in-office visit no ans lvm

## 2021-04-26 NOTE — Telephone Encounter (Signed)
Att to contact pt to schedule in-office visit no ans lvm  °

## 2021-05-18 ENCOUNTER — Telehealth: Payer: Self-pay | Admitting: Family

## 2021-05-18 ENCOUNTER — Ambulatory Visit: Payer: Self-pay | Admitting: *Deleted

## 2021-05-18 DIAGNOSIS — J019 Acute sinusitis, unspecified: Secondary | ICD-10-CM

## 2021-05-18 DIAGNOSIS — J4540 Moderate persistent asthma, uncomplicated: Secondary | ICD-10-CM

## 2021-05-18 MED ORDER — BENZONATATE 100 MG PO CAPS: 100.0000 mg | ORAL_CAPSULE | Freq: Three times a day (TID) | ORAL | 0 refills | Status: DC | PRN

## 2021-05-18 MED ORDER — AMOXICILLIN-POT CLAVULANATE 875-125 MG PO TABS: 1.0000 | ORAL_TABLET | Freq: Two times a day (BID) | ORAL | 0 refills | Status: DC

## 2021-05-18 MED ORDER — ALBUTEROL SULFATE (2.5 MG/3ML) 0.083% IN NEBU: 2.5000 mg | INHALATION_SOLUTION | Freq: Four times a day (QID) | RESPIRATORY_TRACT | 5 refills | Status: DC | PRN

## 2021-05-18 NOTE — Telephone Encounter (Signed)
°  Chief Complaint: sinus pain/congestion Symptoms: sinus pain, congestion Frequency: 1 month Pertinent Negatives:  Disposition: [] ED /[] Urgent Care (no appt availability in office) / [] Appointment(In office/virtual)/ [x]  [facility]/ [] Home Care/ [] Refused Recommended Disposition /[] [place] [facility]/ []  Follow-up with PCP Additional Notes: Patient states she has been unable to get appointment with her PCP and she has transportation issues- patient scheduled virtual UC

## 2021-05-18 NOTE — Telephone Encounter (Signed)
Pt hung up or call dropped prior to triage. Pt called back, see triage encounter.

## 2021-05-18 NOTE — Progress Notes (Signed)
Virtual Visit Consent   Janice Ryan, you are scheduled for a virtual visit with a [facility] provider today.     Just as with appointments in the office, your consent must be obtained to participate.  Your consent will be active for this visit and any virtual visit you may have with one of our providers in the next 365 days.     If you have a MyChart account, a copy of this consent can be sent to you electronically.  All virtual visits are billed to your insurance company just like a traditional visit in the office.    As this is a virtual visit, video technology does not allow for your provider to perform a traditional examination.  This may limit your provider's ability to fully assess your condition.  If your provider identifies any concerns that need to be evaluated in person or the need to arrange testing (such as labs, EKG, etc.), we will make arrangements to do so.     Although advances in technology are sophisticated, we cannot ensure that it will always work on either your end or our end.  If the connection with a video visit is poor, the visit may have to be switched to a telephone visit.  With either a video or telephone visit, we are not always able to ensure that we have a secure connection.     I need to obtain your verbal consent now.   Are you willing to proceed with your visit today?    Janice Ryan has provided verbal consent on 05/18/2021 for a virtual visit (video or telephone).   Janice Dun, FNP   Date: 05/18/2021 2:26 PM   Virtual Visit via Video Note   I, Janice Ryan, connected with  Janice Ryan  ([id number], 02/24/1956) on 05/18/21 at  2:15 PM EST by a video-enabled telemedicine application and verified that I am speaking with the correct person using two identifiers.  Location: Patient: Virtual Visit Location Patient: Home Provider: Virtual Visit Location Provider: Home Office   I discussed the limitations of evaluation and  management by telemedicine and the availability of in person appointments. The patient expressed understanding and agreed to proceed.    History of Present Illness: Janice Ryan is a 66 y.o. who identifies as a female who was assigned female at birth, and is being seen today for sinus problems. She has taken two home COVID tests at home that was negative.   HPI: Sinusitis This is a new problem. The current episode started 1 to 4 weeks ago. The problem has been gradually worsening since onset. The maximum temperature recorded prior to her arrival was 101 - 101.9 F. Her pain is at a severity of 8/10. The pain is moderate. Associated symptoms include chills, congestion, coughing, ear pain, headaches, a hoarse voice, sinus pressure, sneezing and a sore throat. Past treatments include acetaminophen. The treatment provided mild relief.   Problems:  Patient Active Problem List   Diagnosis Date Noted   Major depressive disorder, recurrent episode, mild (Janice Ryan) 03/31/2019   Cellulitis 03/21/2019   Cellulitis of both lower extremities 03/20/2019   Chronic respiratory failure with hypoxia ([place]) 12/10/2018   HTN (hypertension) 12/10/2018   Anxiety state 12/10/2018   Chronic pain of right knee    Moderate persistent asthma 07/28/2018    Allergies:  Allergies  Allergen Reactions   Sulfa Antibiotics Nausea And Vomiting and Other (See Comments)    Stomach pain   Medications:  Current Outpatient Medications:  amoxicillin-clavulanate (AUGMENTIN) 875-125 MG tablet, Take 1 tablet by mouth 2 (two) times daily., Disp: 14 tablet, Rfl: 0   albuterol (PROVENTIL) (2.5 MG/3ML) 0.083% nebulizer solution, Take 3 mLs (2.5 mg total) by nebulization every 6 (six) hours as needed for wheezing or shortness of breath., Disp: 150 mL, Rfl: 5   albuterol (VENTOLIN HFA) 108 (90 Base) MCG/ACT inhaler, Inhale 2 puffs into the lungs every 6 (six) hours as needed for wheezing or shortness of breath., Disp: 18 g, Rfl: 1    busPIRone (BUSPAR) 5 MG tablet, TAKE ONE TABLET BY MOUTH THREE TIMES A DAY, Disp: 90 tablet, Rfl: 1   carbamide peroxide (DEBROX) 6.5 % OTIC solution, Place 5 drops into both ears 2 (two) times daily. Do not use more than 4 days in a row., Disp: 15 mL, Rfl: 0   Cholecalciferol (VITAMIN D3 PO), Take 1 tablet by mouth daily., Disp: , Rfl:    diclofenac Sodium (VOLTAREN) 1 % GEL, Apply 2 g topically 4 (four) times daily as needed. (Patient not taking: Reported on 08/11/2019), Disp: 350 g, Rfl: 0   DULoxetine (CYMBALTA) 60 MG capsule, Take 1 capsule (60 mg total) by mouth daily. (Patient taking differently: Take 120 mg by mouth daily. ), Disp: 30 capsule, Rfl: 2   erythromycin ophthalmic ointment, Place 1 application into the right eye at bedtime., Disp: 3.5 g, Rfl: 0   Ferrous Sulfate (IRON PO), Take 1 tablet by mouth every other day., Disp: , Rfl:    Fluticasone-Salmeterol (ADVAIR) 500-50 MCG/DOSE AEPB, Inhale 1 puff into the lungs 2 (two) times daily., Disp: 60 each, Rfl: 2   furosemide (LASIX) 20 MG tablet, Take 1 tablet (20 mg total) by mouth daily. (Patient not taking: Reported on 08/08/2019), Disp: 30 tablet, Rfl: 1   gabapentin (NEURONTIN) 100 MG capsule, TAKE ONE CAPSULE BY MOUTH THREE TIMES A DAY, Disp: 90 capsule, Rfl: 1   lisinopril (ZESTRIL) 20 MG tablet, Take 1 tablet (20 mg total) by mouth daily., Disp: 30 tablet, Rfl: 0   meloxicam (MOBIC) 7.5 MG tablet, Take 1 tablet (7.5 mg total) by mouth daily. As needed for pain; take after eating, Disp: 30 tablet, Rfl: 4   methocarbamol (ROBAXIN) 500 MG tablet, Take 1 tablet (500 mg total) by mouth 2 (two) times daily. (Patient not taking: Reported on 08/08/2019), Disp: 60 tablet, Rfl: 0   montelukast (SINGULAIR) 10 MG tablet, TAKE ONE TABLET BY MOUTH EVERY NIGHT AT BEDTIME, Disp: 90 tablet, Rfl: 1   Multiple Vitamin (MULTIVITAMIN WITH MINERALS) TABS tablet, Take 1 tablet by mouth daily., Disp: , Rfl:    nystatin (MYCOSTATIN/NYSTOP) powder, Apply 1  application topically 3 (three) times daily. (Patient not taking: Reported on 08/11/2019), Disp: 15 g, Rfl: 0   nystatin cream (MYCOSTATIN), Apply to affected area 2 times daily as needed for fungal rash, Disp: 30 g, Rfl: 3   OXYGEN, Inhale into the lungs at bedtime as needed (2 L)., Disp: , Rfl:    traZODone (DESYREL) 50 MG tablet, TAKE ONE TABLET BY MOUTH EVERY NIGHT AT BEDTIME AS NEEDED FOR SLEEP, Disp: 90 tablet, Rfl: 0  Observations/Objective: Patient is well-developed, well-nourished in no acute distress.  Resting comfortably \ at home.  Head is normocephalic, atraumatic.  No labored breathing.  Speech is clear and coherent with logical content.  Patient is alert and oriented at baseline.  Nasal congestion   Assessment and Plan: 1. Acute sinusitis, recurrence not specified, unspecified location - amoxicillin-clavulanate (AUGMENTIN) 875-125 MG tablet; Take 1 tablet  by mouth 2 (two) times daily.  Dispense: 14 tablet; Refill: 0  2. Moderate persistent asthma, unspecified whether complicated - albuterol (PROVENTIL) (2.5 MG/3ML) 0.083% nebulizer solution; Take 3 mLs (2.5 mg total) by nebulization every 6 (six) hours as needed for wheezing or shortness of breath.  Dispense: 150 mL; Refill: 5  - Take meds as prescribed - Use a cool mist humidifier  -Use saline nose sprays frequently -Force fluids -For any cough or congestion  Use plain Mucinex- regular strength or max strength is fine -For fever or aces or pains- take tylenol or ibuprofen. -Throat lozenges if help  Follow Up Instructions: I discussed the assessment and treatment plan with the patient. The patient was provided an opportunity to ask questions and all were answered. The patient agreed with the plan and demonstrated an understanding of the instructions.  A copy of instructions were sent to the patient via MyChart unless otherwise noted below.     The patient was advised to call back or seek an in-person evaluation if the  symptoms worsen or if the condition fails to improve as anticipated.  Time:  I spent 12 minutes with the patient via telehealth technology discussing the above problems/concerns.    Janice Dun, FNP

## 2021-05-18 NOTE — Telephone Encounter (Signed)
Reason for Disposition  [1] SEVERE pain AND [2] not improved 2 hours after pain medicine  Answer Assessment - Initial Assessment Questions 1. LOCATION: "Where does it hurt?"      Clogged sinus- forehead, under eyes- face swollen 2. ONSET: "When did the sinus pain start?"  (e.g., hours, days)      1 month 3. SEVERITY: "How bad is the pain?"   (Scale 1-10; mild, moderate or severe)   - MILD (1-3): doesn't interfere with normal activities    - MODERATE (4-7): interferes with normal activities (e.g., work or school) or awakens from sleep   - SEVERE (8-10): excruciating pain and patient unable to do any normal activities        severe 4. RECURRENT SYMPTOM: "Have you ever had sinus problems before?" If Yes, ask: "When was the last time?" and "What happened that time?"      yes 5. NASAL CONGESTION: "Is the nose blocked?" If Yes, ask: "Can you open it or must you breathe through your mouth?"     blocked 6. NASAL DISCHARGE: "Do you have discharge from your nose?" If so ask, "What color?"     Thick- deep yellow 7. FEVER: "Do you have a fever?" If Yes, ask: "What is it, how was it measured, and when did it start?"     unsure 8. OTHER SYMPTOMS: "Do you have any other symptoms?" (e.g., sore throat, cough, earache, difficulty breathing)     Sore throat, cough, ear pressure-R worse 9. PREGNANCY: "Is there any chance you are pregnant?" "When was your last menstrual period?"     *No Answer*  Protocols used: Sinus Pain or Congestion-A-AH

## 2021-06-06 ENCOUNTER — Telehealth: Payer: Self-pay | Admitting: Family

## 2021-06-06 NOTE — Telephone Encounter (Signed)
Pt Janice Ryan request refill be sent to pharmacy for sinus infection. She is wheelchair bound. Advised 24/48 hours. ?

## 2021-06-06 NOTE — Telephone Encounter (Signed)
Offer patient appointment with any provider in office with soonest availability.

## 2021-06-06 NOTE — Telephone Encounter (Signed)
Pt Janice Ryan called to request a medication refill for sinus infection. Says she still has same systoms are accuring. Advised 24/48 hrs ?

## 2021-06-07 NOTE — Telephone Encounter (Signed)
I attempt to call pt to set up appt with Tonya to schedule for sinus infection and there was no answer. ?

## 2021-06-22 ENCOUNTER — Encounter: Payer: Self-pay | Admitting: Family

## 2021-06-22 ENCOUNTER — Other Ambulatory Visit: Payer: Self-pay

## 2021-06-22 ENCOUNTER — Other Ambulatory Visit: Payer: Self-pay | Admitting: Family

## 2021-06-22 ENCOUNTER — Ambulatory Visit: Payer: Self-pay | Admitting: *Deleted

## 2021-06-22 DIAGNOSIS — I10 Essential (primary) hypertension: Secondary | ICD-10-CM

## 2021-06-22 NOTE — Telephone Encounter (Signed)
?  Chief Complaint: sinus sx have not went away and requesting additional antibiotics ?Symptoms: headaches, sinus pressure, cough , yellow drainage from nose, no sleeping, low grade fever, reports difficulty breathing at times denies now  ?Frequency: na  ?Pertinent Negatives: Patient denies chest pain , difficulty breathing  ?Disposition: [] ED /[] Urgent Care (no appt availability in office) / [x] Appointment(In office/virtual)/ []  [facility]/ [] Home Care/ [] Refused Recommended Disposition /[]  Mobile Bus/ []  Follow-up with PCP ?Additional Notes:  ? ?Requesting multiple medications. Advair has expired. Requesting pulmonary referral to [facility]. My Chart VV scheduled for today. ? ? Reason for Disposition ? [1] Taking antibiotic > 72 hours (3 days) AND [2] sinus pain not improved ? ?Answer Assessment - Initial Assessment Questions ?1. ANTIBIOTIC: "What antibiotic are you receiving?" "How many times per day?" ?    Augmentin 1 tablet 2 times per day  ?2. ONSET: "When was the antibiotic started?" ?    05/18/21 ?3. PAIN: "How bad is the sinus pain?"   (Scale 1-10; mild, moderate or severe) ?  - MILD (1-3): doesn't interfere with normal activities  ?  - MODERATE (4-7): interferes with normal activities (e.g., work or school) or awakens from sleep ?  - SEVERE (8-10): excruciating pain and patient unable to do any normal activities    ?    Severe per paitent ?4. FEVER: "Do you have a fever?" If Yes, ask: "What is it, how was it measured, and when did it start?"  ?    Low grade  ?5. SYMPTOMS: "Are there any other symptoms you're concerned about?" If Yes, ask: "When did it start?" ?    Cough, sinus pressure, headache not sleeping, ?6. PREGNANCY: "Is there any chance you are pregnant?" "When was your last menstrual period?" ?    na ? ?Protocols used: Sinus Infection on Antibiotic Follow-up Call-A-AH ? ?

## 2021-06-22 NOTE — Progress Notes (Signed)
Erroneous encounter

## 2021-06-22 NOTE — Telephone Encounter (Signed)
Per agent: ?"Pt was prescribed an antibiotic for a sinus infection but hasnt gotten better and pt is asking for additional antibiotics / pt states she is still sick and she is in a wheelchair so she can not come in office easily /pt is still having a cough, sinus pressure with headache, itching in both ears and drainage with a slight fever / she has been like this for 6 weeks now / please advise" ? ?Attempted to reach pt for triage, left VM to call back to discuss symptoms.  ?

## 2021-06-23 ENCOUNTER — Encounter: Payer: Self-pay | Admitting: Nurse Practitioner

## 2021-06-23 ENCOUNTER — Telehealth (INDEPENDENT_AMBULATORY_CARE_PROVIDER_SITE_OTHER): Payer: Self-pay | Admitting: Nurse Practitioner

## 2021-06-23 ENCOUNTER — Other Ambulatory Visit: Payer: Self-pay

## 2021-06-23 ENCOUNTER — Ambulatory Visit: Payer: Self-pay | Admitting: *Deleted

## 2021-06-23 DIAGNOSIS — G479 Sleep disorder, unspecified: Secondary | ICD-10-CM

## 2021-06-23 DIAGNOSIS — J4540 Moderate persistent asthma, uncomplicated: Secondary | ICD-10-CM

## 2021-06-23 DIAGNOSIS — J019 Acute sinusitis, unspecified: Secondary | ICD-10-CM | POA: Insufficient documentation

## 2021-06-23 MED ORDER — BENZONATATE 100 MG PO CAPS: 100.0000 mg | ORAL_CAPSULE | Freq: Three times a day (TID) | ORAL | 0 refills | Status: DC | PRN

## 2021-06-23 MED ORDER — ALBUTEROL SULFATE (2.5 MG/3ML) 0.083% IN NEBU: 2.5000 mg | INHALATION_SOLUTION | Freq: Four times a day (QID) | RESPIRATORY_TRACT | 5 refills | Status: DC | PRN

## 2021-06-23 MED ORDER — DOXYCYCLINE HYCLATE 100 MG PO TABS: 100.0000 mg | ORAL_TABLET | Freq: Two times a day (BID) | ORAL | 0 refills | Status: AC

## 2021-06-23 MED ORDER — ALBUTEROL SULFATE HFA 108 (90 BASE) MCG/ACT IN AERS: 2.0000 | INHALATION_SPRAY | Freq: Four times a day (QID) | RESPIRATORY_TRACT | 1 refills | Status: DC | PRN

## 2021-06-23 MED ORDER — TRAZODONE HCL 50 MG PO TABS: 50.0000 mg | ORAL_TABLET | Freq: Every day | ORAL | 0 refills | Status: DC

## 2021-06-23 NOTE — Progress Notes (Signed)
Virtual Visit via Telephone Note ? ?I connected with Janice Ryan on 06/23/21 at 10:40 AM EDT by telephone and verified that I am speaking with the correct person using two identifiers. ? ?Location: ?Patient: home ?Provider: office ?  ?I discussed the limitations, risks, security and privacy concerns of performing an evaluation and management service by telephone and the availability of in person appointments. I also discussed with the patient that there may be a patient responsible charge related to this service. The patient expressed understanding and agreed to proceed. ? ? ?History of Present Illness: ? ?Patient presents today through telephone visit for follow-up on sinusitis.  She was seen.  Telephone visit on 05/18/2021 and was prescribed Augmentin.  She states that she is still currently has sinus congestion pressure and pain.  She states that her asthma symptoms have flared up as well.  She states that she does need refills today on her asthma medications and would like another round of antibiotics if possible. Denies f/c/s, n/v/d, hemoptysis, PND, chest pain or edema. ? ? ? ?  ?Observations/Objective: ? ? ?  08/08/2019  ?  6:22 AM 08/08/2019  ?  6:20 AM 08/08/2019  ?  6:19 AM  ?Vitals with BMI  ?Height 5\' 8"     ?Systolic  138 138  ?Diastolic  78 78  ?Pulse  82 83  ? ? ? ? ?Assessment and Plan: ? ?1. Moderate persistent asthma, unspecified whether complicated ? ?- albuterol (PROVENTIL) (2.5 MG/3ML) 0.083% nebulizer solution; Take 3 mLs (2.5 mg total) by nebulization every 6 (six) hours as needed for wheezing or shortness of breath.  Dispense: 150 mL; Refill: 5 ?- albuterol (VENTOLIN HFA) 108 (90 Base) MCG/ACT inhaler; Inhale 2 puffs into the lungs every 6 (six) hours as needed for wheezing or shortness of breath.  Dispense: 18 g; Refill: 1 ? ?2. Acute sinusitis, recurrence not specified, unspecified location ? ?- benzonatate (TESSALON PERLES) 100 MG capsule; Take 1 capsule (100 mg total) by mouth 3 (three)  times daily as needed.  Dispense: 20 capsule; Refill: 0 ?- doxycycline (VIBRA-TABS) 100 MG tablet; Take 1 tablet (100 mg total) by mouth 2 (two) times daily for 10 days.  Dispense: 20 tablet; Refill: 0 ? ?3. Sleep disturbance ? ?- traZODone (DESYREL) 50 MG tablet; Take 1 tablet (50 mg total) by mouth at bedtime.  Dispense: 30 tablet; Refill: 0 ? ?Follow up: ? ?Follow up with PCP ? ?  ?I discussed the assessment and treatment plan with the patient. The patient was provided an opportunity to ask questions and all were answered. The patient agreed with the plan and demonstrated an understanding of the instructions. ?  ?The patient was advised to call back or seek an in-person evaluation if the symptoms worsen or if the condition fails to improve as anticipated. ? ?I provided 23 minutes of non-face-to-face time during this encounter. ? ? ? , NP ? ?

## 2021-06-23 NOTE — Telephone Encounter (Signed)
Already triaged in another encounter today.  ? ? ?Summary: sinus discomfort / rx request  ? The patient has been experiencing sinus discomfort, drainage and an elevated temperature  ? ?The patient has been previously seen for these continuing symptoms by their PCP  ? ?The patient would like to have their antibiotics extended  ? ?Please contact further when possible   ?  ? ?

## 2021-06-23 NOTE — Telephone Encounter (Signed)
Left VM for pt to call back to discuss symptoms/request. Triaged yesterday, sent to practice for review. ?

## 2021-06-23 NOTE — Telephone Encounter (Signed)
?  Chief Complaint: SOB ?Symptoms: SOB ?Frequency: with any activity ?Pertinent Negatives: Patient denies chest pain or severe SOB ?Disposition: [] ED /[] Urgent Care (no appt availability in office) / [x] Appointment(In office/virtual)/ []  [facility]/ [] Home Care/ [] Refused Recommended Disposition /[] [facility]/ []  Follow-up with PCP ?Additional Notes: Missed appt yesterday, slept through it. She does not have MyChart, this will be a telephone visit. ? ?Reason for Disposition ? [1] Longstanding difficulty breathing (e.g., CHF, COPD, emphysema) AND [2] WORSE than normal ? ?Answer Assessment - Initial Assessment Questions ?1. RESPIRATORY STATUS: "Describe your breathing?" (e.g., wheezing, shortness of breath, unable to speak, severe coughing) wheezing and sob ?2. ONSET: "When did this breathing problem begin?" 5 days ?3. PATTERN "Does the difficult breathing come and go, or has it been constant since it started?" Having to use oxygen to walk ?4. SEVERITY: "How bad is your breathing?" (e.g., mild, moderate, severe)  ?  - MILD: No SOB at rest, mild SOB with walking, speaks normally in sentences, can lie down, no retractions, pulse < 100.  ?  - MODERATE: SOB at rest, SOB with minimal exertion and prefers to sit, cannot lie down flat, speaks in phrases, mild retractions, audible wheezing, pulse 100-120. MOERATE ?  - SEVERE: Very SOB at rest, speaks in single words, struggling to breathe, sitting hunched forward, retractions, pulse > 120  ?5. LUNG HISTORY: "Do you have any history of lung disease?"  (e.g., pulmonary embolus, asthma, emphysema)  Has resp diseases ?6. OTHER SYMPTOMS: "Do you have any other symptoms? (e.g., dizziness, runny nose, cough, chest pain, fever) no ? ?Protocols used: Breathing Difficulty-A-AH ? ?

## 2021-06-23 NOTE — Patient Instructions (Addendum)
1. Moderate persistent asthma, unspecified whether complicated ? ?- albuterol (PROVENTIL) (2.5 MG/3ML) 0.083% nebulizer solution; Take 3 mLs (2.5 mg total) by nebulization every 6 (six) hours as needed for wheezing or shortness of breath.  Dispense: 150 mL; Refill: 5 ?- albuterol (VENTOLIN HFA) 108 (90 Base) MCG/ACT inhaler; Inhale 2 puffs into the lungs every 6 (six) hours as needed for wheezing or shortness of breath.  Dispense: 18 g; Refill: 1 ? ?2. Acute sinusitis, recurrence not specified, unspecified location ? ?- benzonatate (TESSALON PERLES) 100 MG capsule; Take 1 capsule (100 mg total) by mouth 3 (three) times daily as needed.  Dispense: 20 capsule; Refill: 0 ?- doxycycline (VIBRA-TABS) 100 MG tablet; Take 1 tablet (100 mg total) by mouth 2 (two) times daily for 10 days.  Dispense: 20 tablet; Refill: 0 ? ?3. Sleep disturbance ? ?- traZODone (DESYREL) 50 MG tablet; Take 1 tablet (50 mg total) by mouth at bedtime.  Dispense: 30 tablet; Refill: 0 ? ?Follow up: ? ?Follow up with PCP ? ?

## 2021-06-29 ENCOUNTER — Ambulatory Visit: Payer: Self-pay | Admitting: *Deleted

## 2021-06-29 NOTE — Telephone Encounter (Signed)
?  Summary: pt having issues since 3/22 still sick temp 101  ? Pls fu back up with pt. She is not much better at all, her sinus pressure and drainage is no better and coughing up a lot of "junk". She states that her temp is 101. She thinks that the med may have helped but it is gone, pls fu, was wanting virtual this am as in a wheelchair but knows she needs additional care. [phone]   ?  ? ? ? ?Chief Complaint: chest congestion worse and fever. ?Symptoms: coughing up thick clear to yellow sputum. Hx asthma fever 101 this am  ?Frequency: since 06/23/21 ?Pertinent Negatives: Patient denies chest pain difficulty breathing ?Disposition: [] ED /[] Urgent Care (no appt availability in office) / [] Appointment(In office/virtual)/ []  [facility]/ [] Home Care/ [] Refused Recommended Disposition /[] [facility]/ [x]  Follow-up with PCP ?Additional Notes:  ?Patient requesting additional medication. No available My chart VV until 07/06/21. Patient requesting a call back today from PCP. Please advise . Patient is in W/C and difficulty with transportation.  ? ? ?Reason for Disposition ? [1] Fever > 101 F (38.3 C) AND [2] age > 60 years ? ?Answer Assessment - Initial Assessment Questions ?1. ONSET: "When did the cough begin?"  ?    Prior to 3/23 ?2. SEVERITY: "How bad is the cough today?"  ?    Coughing up clear to yellow sputum  ?3. SPUTUM: "Describe the color of your sputum" (none, dry cough; clear, white, yellow, green) ?    Thick clear to yellow  ?4. HEMOPTYSIS: "Are you coughing up any blood?" If so ask: "How much?" (flecks, streaks, tablespoons, etc.) ?    na ?5. DIFFICULTY BREATHING: "Are you having difficulty breathing?" If Yes, ask: "How bad is it?" (e.g., mild, moderate, severe)  ?  - MILD: No SOB at rest, mild SOB with walking, speaks normally in sentences, can lie down, no retractions, pulse < 100.  ?  - MODERATE: SOB at rest, SOB with minimal exertion and prefers to sit, cannot lie down flat, speaks  in phrases, mild retractions, audible wheezing, pulse 100-120.  ?  - SEVERE: Very SOB at rest, speaks in single words, struggling to breathe, sitting hunched forward, retractions, pulse > 120  ?    Denies  ?6. FEVER: "Do you have a fever?" If Yes, ask: "What is your temperature, how was it measured, and when did it start?" ?    Yes temp 101 this am  ?7. CARDIAC HISTORY: "Do you have any history of heart disease?" (e.g., heart attack, congestive heart failure)  ?    See hx  ?8. LUNG HISTORY: "Do you have any history of lung disease?"  (e.g., pulmonary embolus, asthma, emphysema) ?    Asthma  ?9. PE RISK FACTORS: "Do you have a history of blood clots?" (or: recent major surgery, recent prolonged travel, bedridden) ?    na ?10. OTHER SYMPTOMS: "Do you have any other symptoms?" (e.g., runny nose, wheezing, chest pain) ?      Cough  ?11. PREGNANCY: "Is there any chance you are pregnant?" "When was your last menstrual period?" ?      na ?12. TRAVEL: "Have you traveled out of the country in the last month?" (e.g., travel history, exposures) ?      na ? ?Protocols used: Cough - Acute Productive-A-AH ? ?

## 2021-07-04 ENCOUNTER — Ambulatory Visit: Payer: Self-pay | Admitting: *Deleted

## 2021-07-04 ENCOUNTER — Telehealth: Payer: Commercial Managed Care - HMO | Admitting: Physician Assistant

## 2021-07-04 NOTE — Telephone Encounter (Signed)
?  Chief Complaint: "Sinus infection" ?Symptoms: Sinus pain, drainage,fever 101.0 ?Frequency: 4 weeks ago, completed course of antibiotics ?Pertinent Negatives: Patient denies  ?Disposition: [] ED /[] Urgent Care (no appt availability in office) / [x] Appointment(In office/virtual)/ []  [facility]/ [] Home Care/ [] Refused Recommended Disposition /[] [facility]/ []  Follow-up with PCP ?Additional Notes: Pt expresses frustration. ?Appt secured with Cari for this afternoon.  ?Reason for Disposition ? [1] Sinus pain (not just congestion) AND [2] fever ? ?Answer Assessment - Initial Assessment Questions ?1. LOCATION: "Where does it hurt?"  ?     ?2. ONSET: "When did the sinus pain start?"  (e.g., hours, days)  ?    "Four weeks ago" ?3. SEVERITY: "How bad is the pain?"   (Scale 1-10; mild, moderate or severe) ?  - MILD (1-3): doesn't interfere with normal activities  ?  - MODERATE (4-7): interferes with normal activities (e.g., work or school) or awakens from sleep ?  - SEVERE (8-10): excruciating pain and patient unable to do any normal activities    ?    "Bad" ?4. RECURRENT SYMPTOM: "Have you ever had sinus problems before?" If Yes, ask: "When was the last time?" and "What happened that time?"  ?     ?5. NASAL CONGESTION: "Is the nose blocked?" If Yes, ask: "Can you open it or must you breathe through your mouth?" ?     ?6. NASAL DISCHARGE: "Do you have discharge from your nose?" If so ask, "What color?" ?     ?7. FEVER: "Do you have a fever?" If Yes, ask: "What is it, how was it measured, and when did it start?"  ?    101.0 ?8. OTHER SYMPTOMS: "Do you have any other symptoms?" (e.g., sore throat, cough, earache, difficulty breathing) ?     ?"Sinus infection for 4 weeks" ? ?Protocols used: Sinus Pain or Congestion-A-AH ? ?

## 2021-07-04 NOTE — Progress Notes (Deleted)
?Virtual Visit Consent  ? ?Janice Ryan, you are scheduled for a virtual visit with a [facility] provider today.   ?  ?Just as with appointments in the office, your consent must be obtained to participate.  Your consent will be active for this visit and any virtual visit you may have with one of our providers in the next 365 days.   ?  ?If you have a MyChart account, a copy of this consent can be sent to you electronically.  All virtual visits are billed to your insurance company just like a traditional visit in the office.   ? ?As this is a virtual visit, video technology does not allow for your provider to perform a traditional examination.  This may limit your provider's ability to fully assess your condition.  If your provider identifies any concerns that need to be evaluated in person or the need to arrange testing (such as labs, EKG, etc.), we will make arrangements to do so.   ?  ?Although advances in technology are sophisticated, we cannot ensure that it will always work on either your end or our end.  If the connection with a video visit is poor, the visit may have to be switched to a telephone visit.  With either a video or telephone visit, we are not always able to ensure that we have a secure connection.    ? ?I need to obtain your verbal consent now.   Are you willing to proceed with your visit today?  ?  ?Janice Ryan has provided verbal consent on 07/04/2021 for a virtual visit (video or telephone). ?  ?Roney Jaffe, PA-C  ? ?Date: 07/04/2021 2:03 PM ? ? ?Virtual Visit via Video Note  ? ?I, Roney Jaffe, connected with  Janice Ryan  ([id number], 1956/03/25) on 07/04/21 at  2:00 PM EDT by a video-enabled telemedicine application and verified that I am speaking with the correct person using two identifiers. ? ?Location: ?Patient: {Virtual Visit Location Patient:25492::"Home"} ?Provider: {Virtual Visit Location Provider:25493::"Office/Clinic"} ?  ?I discussed the limitations of  evaluation and management by telemedicine and the availability of in person appointments. The patient expressed understanding and agreed to proceed.   ? ?History of Present Illness: ?Janice Ryan is a 66 y.o. who identifies as a female who was assigned female at birth, and is being seen today for ***. ? ?States that she had a video visit  05/18/21 ?HPI: Sinusitis ?This is a new problem. The current episode started 1 to 4 weeks ago. The problem has been gradually worsening since onset. The maximum temperature recorded prior to her arrival was 101 - 101.9 F. Her pain is at a severity of 8/10. The pain is moderate. Associated symptoms include chills, congestion, coughing, ear pain, headaches, a hoarse voice, sinus pressure, sneezing and a sore throat. Past treatments include acetaminophen. The treatment provided mild relief.   ?  ?Then had another virtual visit 06/23/21 ?Assessment and Plan: ?  ?1. Moderate persistent asthma, unspecified whether complicated ?  ?- albuterol (PROVENTIL) (2.5 MG/3ML) 0.083% nebulizer solution; Take 3 mLs (2.5 mg total) by nebulization every 6 (six) hours as needed for wheezing or shortness of breath.  Dispense: 150 mL; Refill: 5 ?- albuterol (VENTOLIN HFA) 108 (90 Base) MCG/ACT inhaler; Inhale 2 puffs into the lungs every 6 (six) hours as needed for wheezing or shortness of breath.  Dispense: 18 g; Refill: 1 ?  ?2. Acute sinusitis, recurrence not specified, unspecified location ?  ?- benzonatate (TESSALON PERLES)  100 MG capsule; Take 1 capsule (100 mg total) by mouth 3 (three) times daily as needed.  Dispense: 20 capsule; Refill: 0 ?- doxycycline (VIBRA-TABS) 100 MG tablet; Take 1 tablet (100 mg total) by mouth 2 (two) times daily for 10 days.  Dispense: 20 tablet; Refill: 0 ?  ?3. Sleep disturbance ?  ?- traZODone (DESYREL) 50 MG tablet; Take 1 tablet (50 mg total) by mouth at bedtime.  Dispense: 30 tablet; Refill: 0 ?  ? ?States today that she  ?Allergery ref 07/23/20 - left msg  and  ?  ?HPI: HPI  ?Problems:  ?Patient Active Problem List  ? Diagnosis Date Noted  ? Acute sinusitis 06/23/2021  ? 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 Cellulitis 03/21/2019  ? Cellulitis of both lower extremities 03/20/2019  ? Chronic respiratory failure with hypoxia (HCC) 12/10/2018  ? HTN (hypertension) 12/10/2018  ? Anxiety state 12/10/2018  ? Chronic pain of right knee   ? Moderate persistent asthma 07/28/2018  ?  ?Allergies:  ?Allergies  ?Allergen Reactions  ? Sulfa Antibiotics Nausea And Vomiting and Other (See Comments)  ?  Stomach pain  ? ?Medications:  ?Current Outpatient Medications:  ?  albuterol (PROVENTIL) (2.5 MG/3ML) 0.083% nebulizer solution, Take 3 mLs (2.5 mg total) by nebulization every 6 (six) hours as needed for wheezing or shortness of breath., Disp: 150 mL, Rfl: 5 ?  albuterol (VENTOLIN HFA) 108 (90 Base) MCG/ACT inhaler, Inhale 2 puffs into the lungs every 6 (six) hours as needed for wheezing or shortness of breath., Disp: 18 g, Rfl: 1 ?  benzonatate (TESSALON PERLES) 100 MG capsule, Take 1 capsule (100 mg total) by mouth 3 (three) times daily as needed., Disp: 20 capsule, Rfl: 0 ?  busPIRone (BUSPAR) 5 MG tablet, TAKE ONE TABLET BY MOUTH THREE TIMES A DAY, Disp: 90 tablet, Rfl: 1 ?  carbamide peroxide (DEBROX) 6.5 % OTIC solution, Place 5 drops into both ears 2 (two) times daily. Do not use more than 4 days in a row., Disp: 15 mL, Rfl: 0 ?  Cholecalciferol (VITAMIN D3 PO), Take 1 tablet by mouth daily., Disp: , Rfl:  ?  diclofenac Sodium (VOLTAREN) 1 % GEL, Apply 2 g topically 4 (four) times daily as needed. (Patient not taking: Reported on 08/11/2019), Disp: 350 g, Rfl: 0 ?  DULoxetine (CYMBALTA) 60 MG capsule, Take 1 capsule (60 mg total) by mouth daily. (Patient taking differently: Take 120 mg by mouth daily. ), Disp: 30 capsule, Rfl: 2 ?  erythromycin ophthalmic ointment, Place 1 application into the right eye at bedtime., Disp: 3.5 g, Rfl: 0 ?   Ferrous Sulfate (IRON PO), Take 1 tablet by mouth every other day., Disp: , Rfl:  ?  Fluticasone-Salmeterol (ADVAIR) 500-50 MCG/DOSE AEPB, Inhale 1 puff into the lungs 2 (two) times daily., Disp: 60 each, Rfl: 2 ?  furosemide (LASIX) 20 MG tablet, Take 1 tablet (20 mg total) by mouth daily. (Patient not taking: Reported on 08/08/2019), Disp: 30 tablet, Rfl: 1 ?  gabapentin (NEURONTIN) 100 MG capsule, TAKE ONE CAPSULE BY MOUTH THREE TIMES A DAY, Disp: 90 capsule, Rfl: 1 ?  lisinopril (ZESTRIL) 20 MG tablet, TAKE ONE TABLET BY MOUTH DAILY, Disp: 30 tablet, Rfl: 0 ?  meloxicam (MOBIC) 7.5 MG tablet, Take 1 tablet (7.5 mg total) by mouth daily. As needed for pain; take after eating, Disp: 30 tablet, Rfl: 4 ?  methocarbamol (ROBAXIN) 500 MG tablet, Take 1 tablet (500 mg total) by mouth 2 (  two) times daily. (Patient not taking: Reported on 08/08/2019), Disp: 60 tablet, Rfl: 0 ?  montelukast (SINGULAIR) 10 MG tablet, TAKE ONE TABLET BY MOUTH EVERY NIGHT AT BEDTIME, Disp: 90 tablet, Rfl: 1 ?  Multiple Vitamin (MULTIVITAMIN WITH MINERALS) TABS tablet, Take 1 tablet by mouth daily., Disp: , Rfl:  ?  nystatin (MYCOSTATIN/NYSTOP) powder, Apply 1 application topically 3 (three) times daily. (Patient not taking: Reported on 08/11/2019), Disp: 15 g, Rfl: 0 ?  nystatin cream (MYCOSTATIN), Apply to affected area 2 times daily as needed for fungal rash, Disp: 30 g, Rfl: 3 ?  OXYGEN, Inhale into the lungs at bedtime as needed (2 L)., Disp: , Rfl:  ?  traZODone (DESYREL) 50 MG tablet, Take 1 tablet (50 mg total) by mouth at bedtime., Disp: 30 tablet, Rfl: 0 ? ?Observations/Objective: ?Patient is well-developed, well-nourished in no acute distress.  ?Resting comfortably *** at home.  ?Head is normocephalic, atraumatic.  ?No labored breathing. *** ?Speech is clear and coherent with logical content.  ?Patient is alert and oriented at baseline.  ?*** ? ?Assessment and Plan: ?There are no diagnoses linked to this encounter. ?*** ? ?Follow Up  Instructions: ?I discussed the assessment and treatment plan with the patient. The patient was provided an opportunity to ask questions and all were answered. The patient agreed with the plan and demonstrated an

## 2021-07-05 ENCOUNTER — Telehealth: Payer: Commercial Managed Care - HMO | Admitting: Physician Assistant

## 2021-07-05 NOTE — Progress Notes (Deleted)
History of Present Illness: ?Janice Ryan is a 66 y.o. who identifies as a female who was assigned female at birth, and is being seen today for ***. ?  ?States that she had a video visit  05/18/21 ?HPI: Sinusitis ?This is a new problem. The current episode started 1 to 4 weeks ago. The problem has been gradually worsening since onset. The maximum temperature recorded prior to her arrival was 101 - 101.9 F. Her pain is at a severity of 8/10. The pain is moderate. Associated symptoms include chills, congestion, coughing, ear pain, headaches, a hoarse voice, sinus pressure, sneezing and a sore throat. Past treatments include acetaminophen. The treatment provided mild relief.   ?  ?Then had another virtual visit 06/23/21 ?Assessment and Plan: ?  ?1. Moderate persistent asthma, unspecified whether complicated ?  ?- albuterol (PROVENTIL) (2.5 MG/3ML) 0.083% nebulizer solution; Take 3 mLs (2.5 mg total) by nebulization every 6 (six) hours as needed for wheezing or shortness of breath.  Dispense: 150 mL; Refill: 5 ?- albuterol (VENTOLIN HFA) 108 (90 Base) MCG/ACT inhaler; Inhale 2 puffs into the lungs every 6 (six) hours as needed for wheezing or shortness of breath.  Dispense: 18 g; Refill: 1 ?  ?2. Acute sinusitis, recurrence not specified, unspecified location ?  ?- benzonatate (TESSALON PERLES) 100 MG capsule; Take 1 capsule (100 mg total) by mouth 3 (three) times daily as needed.  Dispense: 20 capsule; Refill: 0 ?- doxycycline (VIBRA-TABS) 100 MG tablet; Take 1 tablet (100 mg total) by mouth 2 (two) times daily for 10 days.  Dispense: 20 tablet; Refill: 0 ?  ?3. Sleep disturbance ?  ?- traZODone (DESYREL) 50 MG tablet; Take 1 tablet (50 mg total) by mouth at bedtime.  Dispense: 30 tablet; Refill: 0 ?  ?  ?States today that she  ?Allergery ref 07/23/20 - left msg and  ?

## 2021-07-06 ENCOUNTER — Encounter: Payer: Self-pay | Admitting: Physician Assistant

## 2021-07-06 ENCOUNTER — Ambulatory Visit (INDEPENDENT_AMBULATORY_CARE_PROVIDER_SITE_OTHER): Payer: Self-pay | Admitting: Physician Assistant

## 2021-07-06 VITALS — BP 124/80 | HR 91 | Temp 101.0°F

## 2021-07-06 DIAGNOSIS — J4540 Moderate persistent asthma, uncomplicated: Secondary | ICD-10-CM

## 2021-07-06 DIAGNOSIS — J019 Acute sinusitis, unspecified: Secondary | ICD-10-CM

## 2021-07-06 MED ORDER — BENZONATATE 100 MG PO CAPS: 200.0000 mg | ORAL_CAPSULE | Freq: Three times a day (TID) | ORAL | 0 refills | Status: DC | PRN

## 2021-07-06 MED ORDER — CETIRIZINE HCL 10 MG PO TABS: 10.0000 mg | ORAL_TABLET | Freq: Every day | ORAL | 11 refills | Status: DC

## 2021-07-06 MED ORDER — AZITHROMYCIN 250 MG PO TABS: ORAL_TABLET | ORAL | 0 refills | Status: DC

## 2021-07-06 NOTE — Patient Instructions (Signed)
You are going to take azithromycin to help you with your sinus infection.  I also refilled your Jerilynn Som and you will also start Zyrtec on a daily basis. ? ?You are going to be seen in person at [facility] on Monday, July 11, 2021 at 1 PM.  Please bring all medications to that office visit. ? ?Roney Jaffe, PA-C ?Physician Assistant ?[facility] ?[email or web address] ? ? ?Sinusitis, Adult ?Sinusitis is inflammation of your sinuses. Sinuses are hollow spaces in the bones around your face. Your sinuses are located: ?Around your eyes. ?In the middle of your forehead. ?Behind your nose. ?In your cheekbones. ?Mucus normally drains out of your sinuses. When your nasal tissues become inflamed or swollen, mucus can become trapped or blocked. This allows bacteria, viruses, and fungi to grow, which leads to infection. Most infections of the sinuses are caused by a virus. ?Sinusitis can develop quickly. It can last for up to 4 weeks (acute) or for more than 12 weeks (chronic). Sinusitis often develops after a cold. ?What are the causes? ?This condition is caused by anything that creates swelling in the sinuses or stops mucus from draining. This includes: ?Allergies. ?Asthma. ?Infection from bacteria or viruses. ?Deformities or blockages in your nose or sinuses. ?Abnormal growths in the nose (nasal polyps). ?Pollutants, such as chemicals or irritants in the air. ?Infection from fungi (rare). ?What increases the risk? ?You are more likely to develop this condition if you: ?Have a weak body defense system (immune system). ?Do a lot of swimming or diving. ?Overuse nasal sprays. ?Smoke. ?What are the signs or symptoms? ?The main symptoms of this condition are pain and a feeling of pressure around the affected sinuses. Other symptoms include: ?Stuffy nose or congestion. ?Thick drainage from your nose. ?Swelling and warmth over the affected  sinuses. ?Headache. ?Upper toothache. ?A cough that may get worse at night. ?Extra mucus that collects in the throat or the back of the nose (postnasal drip). ?Decreased sense of smell and taste. ?Fatigue. ?A fever. ?Sore throat. ?Bad breath. ?How is this diagnosed? ?This condition is diagnosed based on: ?Your symptoms. ?Your medical history. ?A physical exam. ?Tests to find out if your condition is acute or chronic. This may include: ?Checking your nose for nasal polyps. ?Viewing your sinuses using a device that has a light (endoscope). ?Testing for allergies or bacteria. ?Imaging tests, such as an MRI or CT scan. ?In rare cases, a bone biopsy may be done to rule out more serious types of fungal sinus disease. ?How is this treated? ?Treatment for sinusitis depends on the cause and whether your condition is chronic or acute. ?If caused by a virus, your symptoms should go away on their own within 10 days. You may be given medicines to relieve symptoms. They include: ?Medicines that shrink swollen nasal passages (topical intranasal decongestants). ?Medicines that treat allergies (antihistamines). ?A spray that eases inflammation of the nostrils (topical intranasal corticosteroids). ?Rinses that help get rid of thick mucus in your nose (nasal saline washes). ?If caused by bacteria, your health care provider may recommend waiting to see if your symptoms improve. Most bacterial infections will get better without antibiotic medicine. You may be given antibiotics if you have: ?A severe infection. ?A weak immune system. ?If caused by narrow nasal passages or nasal polyps, you may need to have surgery. ?Follow these instructions at home: ?Medicines ?Take, use, or apply over-the-counter and prescription medicines only as told by your health care provider. These may  include nasal sprays. ?If you were prescribed an antibiotic medicine, take it as told by your health care provider. Do not stop taking the antibiotic even if you  start to feel better. ?Hydrate and humidify ? ?Drink enough fluid to keep your urine pale yellow. Staying hydrated will help to thin your mucus. ?Use a cool mist humidifier to keep the humidity level in your home above 50%. ?Inhale steam for 10-15 minutes, 3-4 times a day, or as told by your health care provider. You can do this in the bathroom while a hot shower is running. ?Limit your exposure to cool or dry air. ?Rest ?Rest as much as possible. ?Sleep with your head raised (elevated). ?Make sure you get enough sleep each night. ?General instructions ? ?Apply a warm, moist washcloth to your face 3-4 times a day or as told by your health care provider. This will help with discomfort. ?Wash your hands often with soap and water to reduce your exposure to germs. If soap and water are not available, use hand sanitizer. ?Do not smoke. Avoid being around people who are smoking (secondhand smoke). ?Keep all follow-up visits as told by your health care provider. This is important. ?Contact a health care provider if: ?You have a fever. ?Your symptoms get worse. ?Your symptoms do not improve within 10 days. ?Get help right away if: ?You have a severe headache. ?You have persistent vomiting. ?You have severe pain or swelling around your face or eyes. ?You have vision problems. ?You develop confusion. ?Your neck is stiff. ?You have trouble breathing. ?Summary ?Sinusitis is soreness and inflammation of your sinuses. Sinuses are hollow spaces in the bones around your face. ?This condition is caused by nasal tissues that become inflamed or swollen. The swelling traps or blocks the flow of mucus. This allows bacteria, viruses, and fungi to grow, which leads to infection. ?If you were prescribed an antibiotic medicine, take it as told by your health care provider. Do not stop taking the antibiotic even if you start to feel better. ?Keep all follow-up visits as told by your health care provider. This is important. ?This information  is not intended to replace advice given to you by your health care provider. Make sure you discuss any questions you have with your health care provider. ?Document Revised: 08/20/2017 Document Reviewed: 08/20/2017 ?Elsevier Patient Education ? [place]. ? ?

## 2021-07-06 NOTE — Progress Notes (Signed)
Patient has not eaten today and patient has not taken medication today. ?Patient reports pain from falls in the past month being treated by ortho at [facility]. ?Patient has requested refills on medications. Patient reports NEG COVID test last week. ?Patient reports nasal drainage being severe and yellow. Patient has had minimal relief with OTC medications and antibiotics. Patient reports no sleep and needing a new Oxygen order due to hers being 66 years old and the company being out of business. ?

## 2021-07-06 NOTE — Progress Notes (Signed)
? ?Established Patient Office Visit ? ?Subjective:  ?Patient ID: Janice Ryan, female    DOB: 11/11/1955  Age: 66 y.o. MRN: [id number] ? ?CC:  ?Chief Complaint  ?Patient presents with  ? Sinusitis  ? ?Virtual Visit via Telephone Note ? ?I connected with Janice Ryan on 07/06/21 at  9:40 AM EDT by telephone and verified that I am speaking with the correct person using two identifiers. ? ?Location: ?Patient: Home ?Provider: Primary Care at [facility] ?  ?I discussed the limitations, risks, security and privacy concerns of performing an evaluation and management service by telephone and the availability of in person appointments. I also discussed with the patient that there may be a patient responsible charge related to this service. The patient expressed understanding and agreed to proceed. ? ? ? ?History of Present Illness: ?Janice Ryan is a 66 y.o. who states that she has been having continued sinus infection issues. ?  ?States that she had a video visit  05/18/21 ?HPI: Sinusitis ?This is a new problem. The current episode started 1 to 4 weeks ago. The problem has been gradually worsening since onset. The maximum temperature recorded prior to her arrival was 101 - 101.9 F. Her pain is at a severity of 8/10. The pain is moderate. Associated symptoms include chills, congestion, coughing, ear pain, headaches, a hoarse voice, sinus pressure, sneezing and a sore throat. Past treatments include acetaminophen. The treatment provided mild relief.   ?  ?Then had another virtual visit 06/23/21 ?Assessment and Plan: ?  ?1. Moderate persistent asthma, unspecified whether complicated ?  ?- albuterol (PROVENTIL) (2.5 MG/3ML) 0.083% nebulizer solution; Take 3 mLs (2.5 mg total) by nebulization every 6 (six) hours as needed for wheezing or shortness of breath.  Dispense: 150 mL; Refill: 5 ?- albuterol (VENTOLIN HFA) 108 (90 Base) MCG/ACT inhaler; Inhale 2 puffs into the lungs every 6 (six) hours as needed for  wheezing or shortness of breath.  Dispense: 18 g; Refill: 1 ?  ?2. Acute sinusitis, recurrence not specified, unspecified location ?  ?- benzonatate (TESSALON PERLES) 100 MG capsule; Take 1 capsule (100 mg total) by mouth 3 (three) times daily as needed.  Dispense: 20 capsule; Refill: 0 ?- doxycycline (VIBRA-TABS) 100 MG tablet; Take 1 tablet (100 mg total) by mouth 2 (two) times daily for 10 days.  Dispense: 20 tablet; Refill: 0 ?  ?3. Sleep disturbance ?  ?- traZODone (DESYREL) 50 MG tablet; Take 1 tablet (50 mg total) by mouth at bedtime.  Dispense: 30 tablet; Refill: 0 ?  ?  ?States today that she complete the doxycycline from the last visit, states that she did start to notice an improvement but not complete relief.  States a couple days after she stopped the medication her yellow thick nasal discharge resumed. ? ?Request refill on Advair.  States that she does not currently have portable oxygen.  States that she uses 3 L at night, however states that she has been having to keep her oxygen with her when she is moving about her house and even while she is in the shower.  States that she continues to have wheezing. ? ?States that she is taking Singulair at bedtime. ? ?States that she has had an approximate 100 pound weight loss with effort.  States that she currently weighs approximately 360. ? ?States that she has only had relief from her previous episodes of sinus infections from azithromycin.  States that the [employer] Perles do offer some relief from her cough. ? ? ?Observations/Objective: ?  Medical history and current medications reviewed, no physical exam completed ? ? ?Past Medical History:  ?Diagnosis Date  ? Acute respiratory failure with hypoxia (HCC)   ? Asthma   ? Hypertension   ? Morbid obesity (HCC)   ? ? ?Past Surgical History:  ?Procedure Laterality Date  ? HERNIA REPAIR    ? JOINT REPLACEMENT Left   ? knee  ? ? ?Family History  ?Problem Relation Age of Onset  ? Atrial fibrillation Mother   ?  Congestive Heart Failure Father   ? ? ?Social History  ? ?Socioeconomic History  ? Marital status: Married  ?  Spouse name: Not on file  ? Number of children: Not on file  ? Years of education: Not on file  ? Highest education level: Not on file  ?Occupational History  ? Not on file  ?Tobacco Use  ? Smoking status: Never  ? Smokeless tobacco: Never  ?Vaping Use  ? Vaping Use: Never used  ?Substance and Sexual Activity  ? Alcohol use: Never  ? Drug use: Never  ? Sexual activity: Not Currently  ?Other Topics Concern  ? Not on file  ?Social History Narrative  ? Not on file  ? ?Social Determinants of Health  ? ?Financial Resource Strain: Not on file  ?Food Insecurity: Not on file  ?Transportation Needs: Not on file  ?Physical Activity: Not on file  ?Stress: Not on file  ?Social Connections: Not on file  ?Intimate Partner Violence: Not on file  ? ? ?Outpatient Medications Prior to Visit  ?Medication Sig Dispense Refill  ? albuterol (PROVENTIL) (2.5 MG/3ML) 0.083% nebulizer solution Take 3 mLs (2.5 mg total) by nebulization every 6 (six) hours as needed for wheezing or shortness of breath. 150 mL 5  ? albuterol (VENTOLIN HFA) 108 (90 Base) MCG/ACT inhaler Inhale 2 puffs into the lungs every 6 (six) hours as needed for wheezing or shortness of breath. 18 g 1  ? Cholecalciferol (VITAMIN D3 PO) Take 1 tablet by mouth daily.    ? diclofenac Sodium (VOLTAREN) 1 % GEL Apply 2 g topically 4 (four) times daily as needed. 350 g 0  ? DULoxetine (CYMBALTA) 60 MG capsule Take 1 capsule (60 mg total) by mouth daily. (Patient taking differently: Take 120 mg by mouth daily.) 30 capsule 2  ? Fluticasone-Salmeterol (ADVAIR) 500-50 MCG/DOSE AEPB Inhale 1 puff into the lungs 2 (two) times daily. 60 each 2  ? lisinopril (ZESTRIL) 20 MG tablet TAKE ONE TABLET BY MOUTH DAILY 30 tablet 0  ? meloxicam (MOBIC) 7.5 MG tablet Take 1 tablet (7.5 mg total) by mouth daily. As needed for pain; take after eating 30 tablet 4  ? Multiple Vitamin  (MULTIVITAMIN WITH MINERALS) TABS tablet Take 1 tablet by mouth daily.    ? OXYGEN Inhale into the lungs at bedtime as needed (2 L).    ? traZODone (DESYREL) 50 MG tablet Take 1 tablet (50 mg total) by mouth at bedtime. 30 tablet 0  ? busPIRone (BUSPAR) 5 MG tablet TAKE ONE TABLET BY MOUTH THREE TIMES A DAY (Patient not taking: Reported on 07/06/2021) 90 tablet 1  ? carbamide peroxide (DEBROX) 6.5 % OTIC solution Place 5 drops into both ears 2 (two) times daily. Do not use more than 4 days in a row. (Patient not taking: Reported on 07/06/2021) 15 mL 0  ? erythromycin ophthalmic ointment Place 1 application into the right eye at bedtime. (Patient not taking: Reported on 07/06/2021) 3.5 g 0  ? Ferrous Sulfate (  IRON PO) Take 1 tablet by mouth every other day. (Patient not taking: Reported on 07/06/2021)    ? furosemide (LASIX) 20 MG tablet Take 1 tablet (20 mg total) by mouth daily. (Patient not taking: Reported on 08/08/2019) 30 tablet 1  ? gabapentin (NEURONTIN) 100 MG capsule TAKE ONE CAPSULE BY MOUTH THREE TIMES A DAY (Patient not taking: Reported on 07/06/2021) 90 capsule 1  ? montelukast (SINGULAIR) 10 MG tablet TAKE ONE TABLET BY MOUTH EVERY NIGHT AT BEDTIME (Patient not taking: Reported on 07/06/2021) 90 tablet 1  ? nystatin (MYCOSTATIN/NYSTOP) powder Apply 1 application topically 3 (three) times daily. (Patient not taking: Reported on 08/11/2019) 15 g 0  ? nystatin cream (MYCOSTATIN) Apply to affected area 2 times daily as needed for fungal rash (Patient not taking: Reported on 07/06/2021) 30 g 3  ? benzonatate (TESSALON PERLES) 100 MG capsule Take 1 capsule (100 mg total) by mouth 3 (three) times daily as needed. (Patient not taking: Reported on 07/06/2021) 20 capsule 0  ? methocarbamol (ROBAXIN) 500 MG tablet Take 1 tablet (500 mg total) by mouth 2 (two) times daily. (Patient not taking: Reported on 08/08/2019) 60 tablet 0  ? ?No facility-administered medications prior to visit.  ? ? ?Allergies  ?Allergen Reactions  ? Gabapentin  Other (See Comments)  ?  Has un-controlled movement of her body, excessive dribble of saliva at night  ? Sulfa Antibiotics Nausea And Vomiting and Other (See Comments)  ?  Stomach pain  ? ? ?ROS ?Review of Systems

## 2021-07-11 ENCOUNTER — Ambulatory Visit: Payer: Self-pay | Admitting: Physician Assistant

## 2021-07-12 ENCOUNTER — Ambulatory Visit: Payer: Self-pay | Admitting: *Deleted

## 2021-07-12 NOTE — Telephone Encounter (Signed)
Summary: Pt on oxygen and requests call from a nurse  ? Pt requested to speak with a nurse. Pt stated she is on oxygen and could not make her appt yesterday because she was not comfortable trying to come to the appt without her oxygen. Pt also stated the only way she knows to get to the office and have oxygen is by Ambulance. Pt requests that a nurse return her call asap to discuss.   ?  ? ?Chief Complaint: missed appt due to no travel oxygen ?Symptoms: SOB without oxygen ?Frequency: always, uses all the time ?Pertinent Negatives: Patient denies having oxygen tanks, only compressor ?Disposition: [] ED /[] Urgent Care (no appt availability in office) / [x] Appointment(In office/virtual)/ []  [facility]/ [] Home Care/ [] Refused Recommended Disposition /[] [place] [facility]/ []  Follow-up with PCP ?Additional Notes: Virtual appt scheduled for tomorrow due to missed appt today due to no travel oxygen. Pt states only has compressor so can not leave for appt. Unsure of company name. Made contact with who will pass information on to help obtain oxygen for travel. ? ?Reason for Disposition ? [1] Follow-up call to recent contact AND [2] information only call, no triage required ? ?Answer Assessment - Initial Assessment Questions ?1. REASON FOR CALL or QUESTION: "What is your reason for calling today?" or "How can I best help you?" or "What question do you have that I can help answer?" ?    Trying to get portable oxygen ? ?Protocols used: Information Only Call - No Triage-A-[facility] ? ?

## 2021-07-12 NOTE — Progress Notes (Signed)
Virtual Visit via Telephone Note ? ?I connected with Janice Ryan, on 07/13/2021 at 3:48 PM by telephone due to the COVID-19 pandemic and verified that I am speaking with the correct person using two identifiers. ? ?Due to current restrictions/limitations of in-office visits due to the COVID-19 pandemic, this scheduled clinical appointment was converted to a telehealth visit. ?  ?Consent: ?I discussed the limitations, risks, security and privacy concerns of performing an evaluation and management service by telephone and the availability of in person appointments. I also discussed with the patient that there may be a patient responsible charge related to this service. The patient expressed understanding and agreed to proceed. ? ? ?Location of Patient: ?Home ? ?Location of Provider: ? Primary Care at [facility] ? ? ?Persons participating in Telemedicine visit: ?Aubreigh Fuerte ?Ricky Stabs, NP ?Margorie John, CMA ? ? ?History of Present Illness: ?Janice Ryan is a 66 year-old female who presents for follow-up with Maurene Capes, PA on 07/06/2021. ? ?07/06/2021 per PA note: ?1. Acute sinusitis, recurrence not specified, unspecified location ?Trial azithromycin, continue Tessalon Perles, trial Zyrtec.  Patient has not had an in person visit in several years.  Patient agrees to inpatient visit with this provider on July 11, 2021 at 1 PM. ? ?Patient education given on supportive care, red flags for prompt reevaluation ?- azithromycin (ZITHROMAX) 250 MG tablet; Take 2 tabs PO day 1, then take 1 tab PO once daily  Dispense: 6 tablet; Refill: 0 ?- benzonatate (TESSALON PERLES) 100 MG capsule; Take 2 capsules (200 mg total) by mouth 3 (three) times daily as needed for cough.  Dispense: 30 capsule; Refill: 0 ?- cetirizine (ZYRTEC ALLERGY) 10 MG tablet; Take 1 tablet (10 mg total) by mouth daily.  Dispense: 30 tablet; Refill: 11 ?  ?2. Moderate persistent asthma, unspecified whether  complicated ?  ?07/13/2021: ?Reports persisting nasal congestion alternating with runny nose, sinus pressure, wheezing, and chest congestion.  ? ?Took two home Covid tests and negative.  ? ?Reports antibiotic helped some initially. Tessalon perles help some but cough worse at night. Requesting Codeine cough syrup. Explained to patient will be unable to complete this request per our office policy. Patient verbalized understanding. Has purchased over-the-counter Mucinex and humidifier.  ? ?Currently on oxygen 2.5 to 3 LPM at home, O2 sat 94%. Reports unable to leave home for at least 6 months due to needing oxygen outside of the home for transport. Reports when takes oxygen off O2 sat 76%. History of asthma. Using asthma inhalers more than prescribed. Has not been seen by Pulmonology recently and requesting referral back. Reports Pulmonology started her on oxygen. ?  ? ?Past Medical History:  ?Diagnosis Date  ? Acute respiratory failure with hypoxia (HCC)   ? Asthma   ? Hypertension   ? Morbid obesity (HCC)   ? ?Allergies  ?Allergen Reactions  ? Gabapentin Other (See Comments)  ?  Has un-controlled movement of her body, excessive dribble of saliva at night  ? Sulfa Antibiotics Nausea And Vomiting and Other (See Comments)  ?  Stomach pain  ? ? ?Current Outpatient Medications on File Prior to Visit  ?Medication Sig Dispense Refill  ? albuterol (PROVENTIL) (2.5 MG/3ML) 0.083% nebulizer solution Take 3 mLs (2.5 mg total) by nebulization every 6 (six) hours as needed for wheezing or shortness of breath. 150 mL 5  ? albuterol (VENTOLIN HFA) 108 (90 Base) MCG/ACT inhaler Inhale 2 puffs into the lungs every 6 (six) hours as needed for wheezing or shortness  of breath. 18 g 1  ? azithromycin (ZITHROMAX) 250 MG tablet Take 2 tabs PO day 1, then take 1 tab PO once daily 6 tablet 0  ? benzonatate (TESSALON PERLES) 100 MG capsule Take 2 capsules (200 mg total) by mouth 3 (three) times daily as needed for cough. 30 capsule 0  ?  busPIRone (BUSPAR) 5 MG tablet TAKE ONE TABLET BY MOUTH THREE TIMES A DAY (Patient not taking: Reported on 07/06/2021) 90 tablet 1  ? carbamide peroxide (DEBROX) 6.5 % OTIC solution Place 5 drops into both ears 2 (two) times daily. Do not use more than 4 days in a row. (Patient not taking: Reported on 07/06/2021) 15 mL 0  ? cetirizine (ZYRTEC ALLERGY) 10 MG tablet Take 1 tablet (10 mg total) by mouth daily. 30 tablet 11  ? Cholecalciferol (VITAMIN D3 PO) Take 1 tablet by mouth daily.    ? diclofenac Sodium (VOLTAREN) 1 % GEL Apply 2 g topically 4 (four) times daily as needed. 350 g 0  ? DULoxetine (CYMBALTA) 60 MG capsule Take 1 capsule (60 mg total) by mouth daily. (Patient taking differently: Take 120 mg by mouth daily.) 30 capsule 2  ? erythromycin ophthalmic ointment Place 1 application into the right eye at bedtime. (Patient not taking: Reported on 07/06/2021) 3.5 g 0  ? Ferrous Sulfate (IRON PO) Take 1 tablet by mouth every other day. (Patient not taking: Reported on 07/06/2021)    ? Fluticasone-Salmeterol (ADVAIR) 500-50 MCG/DOSE AEPB Inhale 1 puff into the lungs 2 (two) times daily. 60 each 2  ? furosemide (LASIX) 20 MG tablet Take 1 tablet (20 mg total) by mouth daily. (Patient not taking: Reported on 08/08/2019) 30 tablet 1  ? gabapentin (NEURONTIN) 100 MG capsule TAKE ONE CAPSULE BY MOUTH THREE TIMES A DAY (Patient not taking: Reported on 07/06/2021) 90 capsule 1  ? lisinopril (ZESTRIL) 20 MG tablet TAKE ONE TABLET BY MOUTH DAILY 30 tablet 0  ? meloxicam (MOBIC) 7.5 MG tablet Take 1 tablet (7.5 mg total) by mouth daily. As needed for pain; take after eating 30 tablet 4  ? montelukast (SINGULAIR) 10 MG tablet TAKE ONE TABLET BY MOUTH EVERY NIGHT AT BEDTIME (Patient not taking: Reported on 07/06/2021) 90 tablet 1  ? Multiple Vitamin (MULTIVITAMIN WITH MINERALS) TABS tablet Take 1 tablet by mouth daily.    ? nystatin (MYCOSTATIN/NYSTOP) powder Apply 1 application topically 3 (three) times daily. (Patient not taking:  Reported on 08/11/2019) 15 g 0  ? nystatin cream (MYCOSTATIN) Apply to affected area 2 times daily as needed for fungal rash (Patient not taking: Reported on 07/06/2021) 30 g 3  ? OXYGEN Inhale into the lungs at bedtime as needed (2 L).    ? traZODone (DESYREL) 50 MG tablet Take 1 tablet (50 mg total) by mouth at bedtime. 30 tablet 0  ? ?No current facility-administered medications on file prior to visit.  ? ? ?Observations/Objective: ?Alert and oriented x 3. Not in acute distress. Physical examination not completed as this is a telemedicine visit. ? ?Assessment and Plan: ?1. Chronic frontal sinusitis: ?- Azithromycin and Prednisone as prescribed.  ?- Referral to ENT for further evaluation and management.  ?- azithromycin (ZITHROMAX) 250 MG tablet; Take 2 tabs PO day 1, then take 1 tab PO once daily  Dispense: 6 tablet; Refill: 0 ?- predniSONE (DELTASONE) 10 MG tablet; Take 6 tablets (60 mg total) by mouth daily with breakfast for 1 day, THEN 5 tablets (50 mg total) daily with breakfast for 1  day, THEN 4 tablets (40 mg total) daily with breakfast for 1 day, THEN 3 tablets (30 mg total) daily with breakfast for 1 day, THEN 2 tablets (20 mg total) daily with breakfast for 1 day, THEN 1 tablet (10 mg total) daily with breakfast for 1 day.  Dispense: 21 tablet; Refill: 0 ?- Ambulatory referral to ENT ? ?2. Moderate persistent asthma, unspecified whether complicated: ?3. Oxygen dependent: ?- Referral to Pulmonology for further evaluation and management.  ?- Consult with Robyne PeersJane Brazeau, RN case manager to see if we can get portable oxygen assistance pending appointment with Pulmonology.  ?- Ambulatory referral to Pulmonology ? ?4. Chest congestion: ?- Diagnostic chest xray for further evaluation. Patient plans to come to office soon for the same.  ?- DG Chest 2 View; Future ? ? ? ?Follow Up Instructions: ?Referral to ENT and Pulmonology. Follow-up with primary provider as scheduled. ?  ?Patient was given clear instructions to  go to Emergency Department or return to medical center if symptoms don't improve, worsen, or new problems develop.The patient verbalized understanding. ? ?I discussed the assessment and treatment plan with the patient.

## 2021-07-13 ENCOUNTER — Telehealth (INDEPENDENT_AMBULATORY_CARE_PROVIDER_SITE_OTHER): Payer: Self-pay | Admitting: Family

## 2021-07-13 ENCOUNTER — Telehealth: Payer: Self-pay | Admitting: Family

## 2021-07-13 ENCOUNTER — Encounter: Payer: Self-pay | Admitting: Family

## 2021-07-13 DIAGNOSIS — R0989 Other specified symptoms and signs involving the circulatory and respiratory systems: Secondary | ICD-10-CM

## 2021-07-13 DIAGNOSIS — J321 Chronic frontal sinusitis: Secondary | ICD-10-CM

## 2021-07-13 DIAGNOSIS — Z9981 Dependence on supplemental oxygen: Secondary | ICD-10-CM

## 2021-07-13 DIAGNOSIS — J4540 Moderate persistent asthma, uncomplicated: Secondary | ICD-10-CM

## 2021-07-13 MED ORDER — PREDNISONE 10 MG PO TABS: ORAL_TABLET | ORAL | 0 refills | Status: AC

## 2021-07-13 MED ORDER — AZITHROMYCIN 250 MG PO TABS: ORAL_TABLET | ORAL | 0 refills | Status: DC

## 2021-07-13 NOTE — Progress Notes (Signed)
Pt presents for telemedicine visit for follow-up on sinuitis, pt states she still has runny nose, sinus pressure and chest congestion, stating having to sleep upright due to congestion, pt states that asthma has been exacerbating frequently  ?

## 2021-07-14 NOTE — Telephone Encounter (Signed)
Called patient again in efforts to try to schedule her an appointment for Mon 07/18/2021. Message left with call back requested. ? ?Genella Rife - can you please try calling her Friday and scheduling her for an [facility]t at [facility] as I mentioned in my prior note. ? ?thanks ?

## 2021-07-14 NOTE — Telephone Encounter (Signed)
Call received from patient. She explained that she received and O2 concentrator from [facility] about 3 years ago. Since then, they have gone out of business and she received a letter from [employer] at that time instructing her to keep her O2 equipment. She said she is in need of a portable tank so she can get out of the house.  She could not confirm what type of back up tank, if any, she has in the home. She said that she needs the O2 continuously and has experienced recent asthma exacerbations. Without O2 she stated that her O2 sats are 72-82% and with O2, the sats are 90 %.  I told her that I would contact [employer], they purchased [employer], and check on the status of her O2 equipment and determine what information is needed to order the portable tanks for her. The patient said she has [employer]. ? ?I spoke to [facility] and she was able to locate patient's record but was not able to determine exactly what equipment she has in the home. Gerarda Gunther said she would call the patient and find out what she has and let me know what is needed to process an order. ? ?Call received from [place] who said she spoke to the patient and [employer] will need to do a new O2 set up for her.  The patient will need an in person visit and a walk test. The patient told her that she can only come for an appointment on Monday afternoons. Gerarda Gunther emailed me the information that is needed in the provider's note as well as an order form.  ? ?Call placed to patient to inform her that there are no appointments at [facility] on Mon 07/18/2021 but there are openings with Angus Seller, NP at [facility] - [address] - 3rd floor  on Monday 4/17. Currently available times are 1420, 1440 and 1500  Message left for patient with call back requested to this CM or Silvia.  ? ? ?

## 2021-07-14 NOTE — Telephone Encounter (Signed)
Call placed to patient # [phone] to inquire which company provides her O2 to see if we can assist with her request for portable O2.  Message left with call back requested to this CM ?

## 2021-07-15 NOTE — Telephone Encounter (Signed)
Called pt unable to reach left VM to call back so we could schedule an appt .  ?

## 2021-07-18 ENCOUNTER — Telehealth: Payer: Self-pay | Admitting: Family

## 2021-07-18 DIAGNOSIS — G479 Sleep disorder, unspecified: Secondary | ICD-10-CM

## 2021-07-18 DIAGNOSIS — J019 Acute sinusitis, unspecified: Secondary | ICD-10-CM

## 2021-07-18 NOTE — Telephone Encounter (Signed)
Patient's concerns were addressed in telephone note on 07/14/2021 at 1846 by case manager Robyne Peers, RN.

## 2021-07-18 NOTE — Telephone Encounter (Signed)
Copied from [facility] [id number]. Topic: General - Other ?>> Jul 18, 2021  1:34 PM Tessa Lerner A wrote: ?Reason for CRM: The patient would like to speak with a member of staff when possible ? ?The patient would like to coordinate/discuss delivery of their oxygen ? ?Please contact further when possible ?

## 2021-07-19 ENCOUNTER — Other Ambulatory Visit: Payer: Self-pay | Admitting: Nurse Practitioner

## 2021-07-19 DIAGNOSIS — G479 Sleep disorder, unspecified: Secondary | ICD-10-CM

## 2021-07-19 NOTE — Telephone Encounter (Signed)
Call received from patient. She stated that she understands that she needs to have a walk test done in order to qualify for O2 but she does not have portable O2 and is not able to come off of the O2 long enough to leave her home to come to a clinic for a walk test.  [facility] told me that they do not do in home walk tests but I suggested to the patient to contact them and explain her situation and see if they can make an exception for her.  Provided her with the number for [employer] [facility]  [phone] and she said she would call.  ?If she is able to have an in home walk test, she could possibly do her face to face encounter with the provider virtually.  ? ?The patient said she is very appreciative of the care and support that [name] has provided her with managing her respiratory issues and she is finally starting to feel better.  ? ?I called [employer] and [employer] and neither company does in home walk tests.  ?

## 2021-07-19 NOTE — Telephone Encounter (Signed)
noted 

## 2021-07-21 ENCOUNTER — Other Ambulatory Visit: Payer: Self-pay

## 2021-07-21 ENCOUNTER — Telehealth: Payer: Self-pay

## 2021-07-21 DIAGNOSIS — I10 Essential (primary) hypertension: Secondary | ICD-10-CM

## 2021-07-21 MED ORDER — LISINOPRIL 20 MG PO TABS: 20.0000 mg | ORAL_TABLET | Freq: Every day | ORAL | 0 refills | Status: DC

## 2021-07-21 NOTE — Telephone Encounter (Signed)
Attempted to contact patient  [phone] to inquire if she was able to contact [employer] [facility] about a home walk test to qualify for O2.  Message left with call back requested to this CM. ? ?Email sent to [facility] inquiring if [employer] [facility] could do a walk test with the patient at home.  ?

## 2021-07-22 NOTE — Telephone Encounter (Signed)
Pt called and state that she got in touch with [facility]/ she is allowed to get a walk test at their location/ but she was advised that [name] needs to write a script saying that her home oxygen machine needs to be checked / she also stated she needs a walk test and she needs extra oxygen at home in case her machine breaks down before any of this happens / pt stated this is urgent / please advise  ? ?She asked to be called with an update about this matter please  ?

## 2021-07-22 NOTE — Telephone Encounter (Signed)
Unsure of prescription that needs to be written related to patient's report. If we can get specific orders from [employer] [facility] I may be able to better assist patient.

## 2021-07-25 ENCOUNTER — Other Ambulatory Visit: Payer: Self-pay | Admitting: Family

## 2021-07-25 DIAGNOSIS — J4540 Moderate persistent asthma, uncomplicated: Secondary | ICD-10-CM

## 2021-07-25 DIAGNOSIS — Z9981 Dependence on supplemental oxygen: Secondary | ICD-10-CM

## 2021-07-25 NOTE — Telephone Encounter (Signed)
Erskine Squibb,  ? ?Oxygen order complete. Let me know if I can further assist.  ? ?Thank you

## 2021-07-25 NOTE — Telephone Encounter (Signed)
Per Efraim Kaufmann Stenson/ [employer] [facility]:  They can just use the For Home Use Only DME O2 order in Epic. They will need to let us know it's there so we can pull it.Once we have the Rx, we can get a tank out to the patient so she can come see you for her appt and walk test.  ? ?[name]- After it is placed, I can let Melissa know the order has been placed.  ?thanks ? ?

## 2021-07-25 NOTE — Telephone Encounter (Signed)
I spoke to the patient and informed her that we are in the process of clarifying what what is needed for the portable O2 order.   ?She was very appreciative and wants [name] to know that she is getting better, her voice is better.  ?

## 2021-07-28 NOTE — Telephone Encounter (Signed)
Call placed to patient # [phone] to inquire if she heard from [employer] [facility] about the portable O2 tank so we can schedule her for a walk test.  The voicemail was full, unable to leave a message ?

## 2021-08-02 MED ORDER — BENZONATATE 100 MG PO CAPS: 200.0000 mg | ORAL_CAPSULE | Freq: Three times a day (TID) | ORAL | 0 refills | Status: DC | PRN

## 2021-08-02 MED ORDER — TRAZODONE HCL 50 MG PO TABS: 50.0000 mg | ORAL_TABLET | Freq: Every day | ORAL | 1 refills | Status: DC

## 2021-08-02 NOTE — Telephone Encounter (Signed)
Call placed to patient and she said she is feeling much better, " not totally; but a lot better."  She has been in contact with [facility] and she has a walk test scheduled at their office on 08/08/2021 and will let us know what is needed as far as orders.  ? ?She inquired if a referral was placed to [facility] and it has been placed.  ? ?She will call to schedule an appointment with PCP to follow up on her pulmonary status and schedule a physical after her walk test next week.  ? ?She is requesting refills of trazodone and tessalon perles.  ?

## 2021-08-02 NOTE — Telephone Encounter (Signed)
Call placed to patient to inform her that refills were sent to her pharmacy for trazodone and tessalon.Message left with call back requested to this CM ?

## 2021-08-02 NOTE — Telephone Encounter (Signed)
Refills sent for trazodone and tessalon. ?

## 2021-08-25 ENCOUNTER — Other Ambulatory Visit: Payer: Self-pay

## 2021-08-25 ENCOUNTER — Emergency Department (HOSPITAL_COMMUNITY)
Admission: EM | Admit: 2021-08-25 | Discharge: 2021-08-25 | Disposition: A | Discharge status: Home / Self Care | Payer: Commercial Managed Care - HMO | Attending: Emergency Medicine | Admitting: Emergency Medicine

## 2021-08-25 ENCOUNTER — Encounter (HOSPITAL_COMMUNITY): Payer: Self-pay

## 2021-08-25 ENCOUNTER — Emergency Department (HOSPITAL_COMMUNITY): Payer: Commercial Managed Care - HMO

## 2021-08-25 DIAGNOSIS — Z7951 Long term (current) use of inhaled steroids: Secondary | ICD-10-CM | POA: Diagnosis not present

## 2021-08-25 DIAGNOSIS — G8929 Other chronic pain: Secondary | ICD-10-CM

## 2021-08-25 DIAGNOSIS — M79661 Pain in right lower leg: Secondary | ICD-10-CM | POA: Insufficient documentation

## 2021-08-25 DIAGNOSIS — J4540 Moderate persistent asthma, uncomplicated: Secondary | ICD-10-CM | POA: Diagnosis not present

## 2021-08-25 DIAGNOSIS — W010XXA Fall on same level from slipping, tripping and stumbling without subsequent striking against object, initial encounter: Secondary | ICD-10-CM

## 2021-08-25 DIAGNOSIS — I10 Essential (primary) hypertension: Secondary | ICD-10-CM | POA: Diagnosis not present

## 2021-08-25 DIAGNOSIS — Z79899 Other long term (current) drug therapy: Secondary | ICD-10-CM | POA: Insufficient documentation

## 2021-08-25 DIAGNOSIS — M25561 Pain in right knee: Secondary | ICD-10-CM | POA: Diagnosis not present

## 2021-08-25 LAB — URINALYSIS, ROUTINE W REFLEX MICROSCOPIC
Bilirubin Urine: NEGATIVE
Glucose, UA: NEGATIVE mg/dL
Hgb urine dipstick: NEGATIVE
Ketones, ur: NEGATIVE mg/dL
Leukocytes,Ua: NEGATIVE
Nitrite: NEGATIVE
Protein, ur: NEGATIVE mg/dL
Specific Gravity, Urine: 1.016 (ref 1.005–1.030)
pH: 7 (ref 5.0–8.0)

## 2021-08-25 MED ORDER — OXYCODONE-ACETAMINOPHEN 5-325 MG PO TABS
1.0000 | ORAL_TABLET | Freq: Once | ORAL | Status: AC
  Administered 2021-08-25: 1 via ORAL
  Filled 2021-08-25: qty 1

## 2021-08-25 NOTE — Progress Notes (Addendum)
TOC CSW received a consult to set up [facility] services. CSW spoke with pt at the bedside, pt stated she has never had [place] services and does not have a preferred provider. CSW explained to pt her barriers to [facility] services. Pt provided a verbal understanding of this. Pt stated she does have insurance but forgot her insurance card at home. CSW encouraged pt to call back when she gets home to update her information with registration. CSW will reach out to [facility] agency to see who can accept pt for [facility] PT and OT services.    ADDEN Post dc  2:20pm  CSW called pt to inquire about her insurance card. Pt stated she has not got up yet to look for the card. She stated she will call CSW back with the information by 5 pm. CSW explained to pt that her [place] orders that were put in by EDP only last for 24 hrs. and after that, she will need to follow up with her primary care provider to set up [facility] services.   Arlie Solomons.Aloria Looper, MSW, [place]  Transitions of Care Clinical Social Worker I Direct Dial: [phone]  Fax: [phone] [email or web address]

## 2021-08-25 NOTE — ED Triage Notes (Addendum)
Pt to ED via [facility] from home.  Pt got up from bed to use walker to go to bathroom and fell backwards onto buttocks.  Pt c/o pain in buttocks, right hip, knee and ankle.  +PMS in right and left leg.  Pt moved to bariatric bed upon arrival. Pt A&Ox4, NAD noted. Pt states she has taken 2 tramadol tonight.

## 2021-08-25 NOTE — ED Notes (Signed)
Pt to xray

## 2021-08-25 NOTE — Discharge Instructions (Addendum)
It was our pleasure to provide your ER care today - we hope that you feel better.  Take your pain medication as need.   We have made a home health referral - home health agency should be contacting you to set up services - you can also coordinate home health services on ongoing basis with your primary care doctor.  Use walker. Fall precautions.   Follow up with your doctor/orthopedist as planned for your scheduled outpatient imaging and subsequent follow up.  Return to ER if worse, new symptoms, fevers, increased trouble breathing, or other emergency concern.

## 2021-08-25 NOTE — ED Provider Notes (Signed)
Signed out by Dr Madilyn Hook to d/c to home if xrays neg for fx.  Xrays neg for fx.   Compartments of leg soft, not tense, no significant sts noted. Distal pulses palp. Pt comfortable appearing.   Pt notes hx chronic right knee pain, ?exacerbated post fall. Pain currently controlled. Vitals normal. Pt currently appears stable for d/c.   Pt amenable to adding home health services - [facility] team consulted and home health face to face order placed.      Cathren Laine, MD 08/25/21 [phone]

## 2021-08-25 NOTE — Telephone Encounter (Signed)
I called patient to inquire if she had a walk test for O2 with [employer] [facility].  Message left with call back requested to this CM.  Per [facility], the patient has not had a walk test and she said that [employer] would not be doing a walk test for a patient. She stated that the patient was supposed to have portable O2 tanks delivered to her home so she could have a walk test at PCP office  which was the original plan.

## 2021-08-25 NOTE — ED Notes (Signed)
Pt returned from X-ray.  

## 2021-08-25 NOTE — ED Provider Notes (Signed)
[facility]-EMERGENCY DEPT Provider Note   CSN: [id number] Arrival date & time: 08/25/21  0349     History  Chief Complaint  Patient presents with   Knee Pain    Janice Ryan is a 66 y.o. female.   Knee Pain Janice Ryan is a 66 y.o. female who presents to the Emergency Department complaining of leg pain.  She presents to the emergency department by EMS for evaluation of acute on chronic leg pain.  She has a history of prior injury to the right knee that she states started quite a while back with a pop in her knee.  She has been followed by orthopedics with plan to obtain an MRI to evaluate for tendon injury.  She got up this evening to go to the bathroom and she was getting her rolling walker when somehow her feet got mixed up and she fell to her right side.  She felt another pop and complains of severe pain throughout her entire right lower extremity.  The pain is greatest over the right knee but involves the hip, thigh, knee, shin.  No reports of recent illnesses but she does state that she has been urinating since the fall.  No chest pain, abdominal pain, fevers, nausea, vomiting, back pain, recent illnesses.  She does take tramadol, Robaxin at home for pain.  She is on 3 L nasal cannula oxygen at baseline. Home Medications Prior to Admission medications   Medication Sig Start Date End Date Taking? Authorizing Provider  albuterol (PROVENTIL) (2.5 MG/3ML) 0.083% nebulizer solution Take 3 mLs (2.5 mg total) by nebulization every 6 (six) hours as needed for wheezing or shortness of breath. 06/23/21   Ivonne AndrewNichols, Tonya S, NP  albuterol (VENTOLIN HFA) 108 (90 Base) MCG/ACT inhaler Inhale 2 puffs into the lungs every 6 (six) hours as needed for wheezing or shortness of breath. 06/23/21   Ivonne AndrewNichols, Tonya S, NP  azithromycin (ZITHROMAX) 250 MG tablet Take 2 tabs PO day 1, then take 1 tab PO once daily 07/13/21   Rema FendtStephens, [name] J, NP  benzonatate (TESSALON PERLES)  100 MG capsule Take 2 capsules (200 mg total) by mouth 3 (three) times daily as needed for cough. 08/02/21   Hoy RegisterNewlin, Enobong, MD  busPIRone (BUSPAR) 5 MG tablet TAKE ONE TABLET BY MOUTH THREE TIMES A DAY Patient not taking: Reported on 07/06/2021 01/25/20   Fulp, Cammie, MD  carbamide peroxide (DEBROX) 6.5 % OTIC solution Place 5 drops into both ears 2 (two) times daily. Do not use more than 4 days in a row. Patient not taking: Reported on 07/06/2021 07/23/20   Rema FendtStephens, [name] J, NP  cetirizine (ZYRTEC ALLERGY) 10 MG tablet Take 1 tablet (10 mg total) by mouth daily. 07/06/21   Mayers, Cari S, PA-C  Cholecalciferol (VITAMIN D3 PO) Take 1 tablet by mouth daily.    [provider]  diclofenac Sodium (VOLTAREN) 1 % GEL Apply 2 g topically 4 (four) times daily as needed. 04/02/19   Burnadette PopAdhikari, Amrit, MD  DULoxetine (CYMBALTA) 60 MG capsule Take 1 capsule (60 mg total) by mouth daily. Patient taking differently: Take 120 mg by mouth daily. 06/26/19   Fulp, Cammie, MD  erythromycin ophthalmic ointment Place 1 application into the right eye at bedtime. Patient not taking: Reported on 07/06/2021 02/02/21   Ivonne AndrewNichols, Tonya S, NP  Ferrous Sulfate (IRON PO) Take 1 tablet by mouth every other day. Patient not taking: Reported on 07/06/2021    [provider]  Fluticasone-Salmeterol (ADVAIR) 500-50  MCG/DOSE AEPB Inhale 1 puff into the lungs 2 (two) times daily. 07/23/20 07/06/21  [name], NP  furosemide (LASIX) 20 MG tablet Take 1 tablet (20 mg total) by mouth daily. Patient not taking: Reported on 08/08/2019 11/25/18   Hoy Register, MD  gabapentin (NEURONTIN) 100 MG capsule TAKE ONE CAPSULE BY MOUTH THREE TIMES A DAY Patient not taking: Reported on 07/06/2021 01/25/20   Fulp, Cammie, MD  lisinopril (ZESTRIL) 20 MG tablet Take 1 tablet (20 mg total) by mouth daily. 07/21/21   [name], NP  meloxicam (MOBIC) 7.5 MG tablet Take 1 tablet (7.5 mg total) by mouth daily. As needed for pain; take after eating  09/26/19   Marcine Matar, MD  montelukast (SINGULAIR) 10 MG tablet TAKE ONE TABLET BY MOUTH EVERY NIGHT AT BEDTIME Patient not taking: Reported on 07/06/2021 08/15/19   Cain Saupe, MD  Multiple Vitamin (MULTIVITAMIN WITH MINERALS) TABS tablet Take 1 tablet by mouth daily.    [provider]  nystatin (MYCOSTATIN/NYSTOP) powder Apply 1 application topically 3 (three) times daily. Patient not taking: Reported on 08/11/2019 08/08/19   Ronnie Doss A, PA-C  nystatin cream (MYCOSTATIN) Apply to affected area 2 times daily as needed for fungal rash Patient not taking: Reported on 07/06/2021 09/26/19   Marcine Matar, MD  OXYGEN Inhale into the lungs at bedtime as needed (2 L).    [provider]  traZODone (DESYREL) 50 MG tablet Take 1 tablet (50 mg total) by mouth at bedtime. 08/02/21   Hoy Register, MD      Allergies    Gabapentin and Sulfa antibiotics    Review of Systems   Review of Systems  All other systems reviewed and are negative.  Physical Exam Updated Vital Signs BP (!) 143/76 (BP Location: Left Arm)   Pulse 77   Temp 97.7 F (36.5 C) (Oral)   Resp 18   Ht 5\' 7"  (1.702 m)   Wt (!) 244.9 kg   SpO2 (!) 88%   BMI 84.58 kg/m  Physical Exam Vitals and nursing note reviewed.  Constitutional:      Appearance: She is well-developed.  HENT:     Head: Normocephalic and atraumatic.  Cardiovascular:     Rate and Rhythm: Normal rate and regular rhythm.     Heart sounds: No murmur heard. Pulmonary:     Effort: Pulmonary effort is normal. No respiratory distress.     Breath sounds: Normal breath sounds.  Abdominal:     Palpations: Abdomen is soft.     Tenderness: There is no abdominal tenderness. There is no guarding or rebound.  Musculoskeletal:     Comments: 2+ DP pulses bilaterally.  There is significant tenderness to palpation over the right knee with decreased range of motion secondary to pain.  There is mild tenderness to palpation over the right thigh,  tib-fib.  She is able to dorsiflex plantarflex at the ankle.  Skin:    General: Skin is warm and dry.  Neurological:     Mental Status: She is alert and oriented to person, place, and time.     Comments: Sensation to light touch intact in bilateral feet.    Psychiatric:        Behavior: Behavior normal.    ED Results / Procedures / Treatments   Labs (all labs ordered are listed, but only abnormal results are displayed) Labs Reviewed  URINALYSIS, ROUTINE W REFLEX MICROSCOPIC    EKG None  Radiology No results found.  Procedures Procedures    Medications Ordered in ED Medications  oxyCODONE-acetaminophen (PERCOCET/ROXICET) 5-325 MG per tablet 1 tablet (1 tablet Oral Given 08/25/21 0533)    ED Course/ Medical Decision Making/ A&P                           Medical Decision Making Amount and/or Complexity of Data Reviewed Labs: ordered. Radiology: ordered.  Risk Prescription drug management.   Pt with hx/o chronic right knee pain here for evaluation of worsening pain after a fall.  She has tenderness on palpation, decreased range of motion (likely baseline).  She was hypoxic on ED presentation but her nasal cannula was in backwards - after placing properly she was satting 100% on room air.  No systemic sxs.  She did report increased urination today - UA neg for acute abnormality.  Pt care transferred pending plain films.          Final Clinical Impression(s) / ED Diagnoses Final diagnoses:  None    Rx / DC Orders ED Discharge Orders     None         Tilden Fossa, MD 08/25/21 2246

## 2021-09-01 ENCOUNTER — Telehealth: Payer: Self-pay | Admitting: Family

## 2021-09-01 ENCOUNTER — Ambulatory Visit: Payer: Self-pay

## 2021-09-01 NOTE — Telephone Encounter (Signed)
Pt is calling to ask if she still needs an appt to follow up for a walk test.

## 2021-09-01 NOTE — Telephone Encounter (Signed)
    Chief Complaint: Larey Seat last week, hurt right knee. Asking for oxycodone. Asking if she needs appointment for O2 walking test. Also needs "help in my home to help take care of me." Symptoms: Pain right knee Frequency:  Pertinent Negatives: Patient denies  Disposition: [] ED /[] Urgent Care (no appt availability in office) / [] Appointment(In office/virtual)/ []  [place] [facility]/ [] Home Care/ [] Refused Recommended Disposition /[] [place] [facility]/ [x]  Follow-up with PCP Additional Notes: Please advise pt.   Answer Assessment - Initial Assessment Questions 1. MECHANISM: "How did the fall happen?"     Fell in bathroom at home 2. DOMESTIC VIOLENCE AND ELDER ABUSE SCREENING: "Did you fall because someone pushed you or tried to hurt you?" If Yes, ask: "Are you safe now?"     No 3. ONSET: "When did the fall happen?" (e.g., minutes, hours, or days ago)     Last week 4. LOCATION: "What part of the body hit the ground?" (e.g., back, buttocks, head, hips, knees, hands, head, stomach)     Right knee 5. INJURY: "Did you hurt (injure) yourself when you fell?" If Yes, ask: "What did you injure? Tell me more about this?" (e.g., body area; type of injury; pain severity)"     Knees 6. PAIN: "Is there any pain?" If Yes, ask: "How bad is the pain?" (e.g., Scale 1-10; or mild,  moderate, severe)   - NONE (0): No pain   - MILD (1-3): Doesn't interfere with normal activities    - MODERATE (4-7): Interferes with normal activities or awakens from sleep    - SEVERE (8-10): Excruciating pain, unable to do any normal activities      Severe 7. SIZE: For cuts, bruises, or swelling, ask: "How large is it?" (e.g., inches or centimeters)      None 8. PREGNANCY: "Is there any chance you are pregnant?" "When was your last menstrual period?"     No 9. OTHER SYMPTOMS: "Do you have any other symptoms?" (e.g., dizziness, fever, weakness; new onset or worsening).      Pain 10. CAUSE: "What do you think caused  the fall (or falling)?" (e.g., tripped, dizzy spell)       Tripped  Protocols used: Falls and [facility]

## 2021-09-02 NOTE — Telephone Encounter (Signed)
[name] Stephens,Fnp does not prescribe Oxycodone pt will be referred to pain management, pt would need an appt for O2 walk test please schedule appt

## 2021-09-14 NOTE — Telephone Encounter (Signed)
Pt called again asking to set up a "walk test"

## 2021-09-16 NOTE — Telephone Encounter (Signed)
Call patient with update.   On 07/13/2021 per patient request she was referred to Pulmonology - [facility] [employer] for chronic respiratory conditions. If she has established with them they should be able to assist with patients request. If she has not established a new referral may be needed.

## 2021-09-20 ENCOUNTER — Other Ambulatory Visit: Payer: Self-pay | Admitting: Family

## 2021-09-20 DIAGNOSIS — Z9981 Dependence on supplemental oxygen: Secondary | ICD-10-CM

## 2021-09-20 DIAGNOSIS — J4540 Moderate persistent asthma, uncomplicated: Secondary | ICD-10-CM

## 2021-09-20 NOTE — Telephone Encounter (Signed)
New Pulmonology referral ordered.

## 2021-09-20 NOTE — Telephone Encounter (Signed)
Att to contact pt to set up appt for PCS forms to be completed for home health no ans unable to leave vm mailbox full. Pt may be able to wait till 07/10 appt that is scheduled.  Spoke w/Alexis with [facility] in regards to Oxygen therapy and other pulmonary issues,  new referral has been placed due to no record of referral being recvd by their office on 07/13/21    Copied from CRM [id number]. Topic: Referral - Request for Referral >> Sep 15, 2021 12:14 PM Lyman Speller wrote: Reason for CRM: Pt was advised by hospital that she needs home health aid due to falls/ please advise about Home health orders asap / pt has called about this before and hasnt heard back

## 2021-09-20 NOTE — Telephone Encounter (Signed)
Attempt to call patient to relay message per Ricky Stabs, unable to reach. Voicemail is full.

## 2021-10-03 NOTE — Progress Notes (Signed)
Erroneous encounter-disregard

## 2021-10-10 ENCOUNTER — Encounter: Payer: Commercial Managed Care - HMO | Admitting: Family

## 2021-10-19 ENCOUNTER — Other Ambulatory Visit: Payer: Self-pay | Admitting: Family

## 2021-10-19 DIAGNOSIS — I10 Essential (primary) hypertension: Secondary | ICD-10-CM

## 2021-10-20 ENCOUNTER — Telehealth: Payer: Self-pay | Admitting: Family

## 2021-10-20 NOTE — Telephone Encounter (Signed)
Copied from CRM [id number]. Topic: General - Other >> Oct 20, 2021 10:41 AM Janice Ryan wrote: Reason for CRM: The patient has called to request orders for a BPI test sent to [facility] in [facility]   The patient is interested in receiving a more mobile portable oxygen machine   The patient would like to speak with a member of clinical staff regarding these orders when possible  Please contact further

## 2021-10-20 NOTE — Telephone Encounter (Signed)
Att to contact pt no ans, if pt is under the care of Pulmonology they will need to complete order for her.

## 2021-10-20 NOTE — Telephone Encounter (Signed)
Requested Prescriptions  Pending Prescriptions Disp Refills  . lisinopril (ZESTRIL) 20 MG tablet [Pharmacy Med Name: LISINOPRIL 20 MG TABLET] 90 tablet 0    Sig: TAKE ONE TABLET BY MOUTH DAILY     Cardiovascular:  ACE Inhibitors Failed - 10/19/2021  6:21 AM      Failed - Cr in normal range and within 180 days    Creatinine, Ser  Date Value Ref Range Status  08/08/2019 1.07 (H) 0.44 - 1.00 mg/dL Final         Failed - K in normal range and within 180 days    Potassium  Date Value Ref Range Status  08/08/2019 4.0 3.5 - 5.1 mmol/L Final         Passed - Patient is not pregnant      Passed - Last BP in normal range    BP Readings from Last 1 Encounters:  08/25/21 112/88         Passed - Valid encounter within last 6 months    Recent Outpatient Visits          3 months ago Chronic frontal sinusitis   Primary Care at [facility], [name] J, NP   3 months ago Acute sinusitis, recurrence not specified, unspecified location   Primary Care at [facility], Cari S, PA-C   3 months ago    Primary Care at [facility], Cari S, PA-C   3 months ago    Primary Care at [facility], Cari S, PA-C   3 months ago Moderate persistent asthma, unspecified whether complicated   Primary Care at [facility], Gildardo Pounds, NP

## 2021-10-24 NOTE — Progress Notes (Signed)
Erroneous encounter-disregard

## 2021-11-01 ENCOUNTER — Encounter: Payer: Self-pay | Admitting: Family

## 2021-11-27 ENCOUNTER — Other Ambulatory Visit: Payer: Self-pay | Admitting: Nurse Practitioner

## 2021-11-27 DIAGNOSIS — J4540 Moderate persistent asthma, uncomplicated: Secondary | ICD-10-CM

## 2021-12-21 ENCOUNTER — Ambulatory Visit: Payer: Self-pay | Admitting: *Deleted

## 2021-12-21 NOTE — Telephone Encounter (Signed)
  Chief Complaint: Right knee injury from a prior fall.   Fell today on knee now can't bear weight or straighten it out.   Using a rolling cart around her house to get around. Symptoms: Pain in right knee.   See above Frequency: [place] today outside.   Already had an injury to right knee before falling today.    She is wanting to talk with [name], NP about ordering an MRI with anesthesia for her knee and also a pulmonary referral for her asthma.   She has new insurance so can see whatever doctors she wants.   Pertinent Negatives: Patient denies cuts or bruises to right knee. Disposition: [] ED /[] Urgent Care (no appt availability in office) / [x] Appointment(In office/virtual)/ []  [place] [facility]/ [] Home Care/ [] Refused Recommended Disposition /[] [place] [facility]/ []  Follow-up with PCP Additional Notes: Appt made for 01/03/2022 at 1:40 with [name], NP to discuss the above issues and about her right knee.   In the meantime she is going to the urgent care today to get her knee x rayed and evaluated from the fall today.

## 2021-12-21 NOTE — Telephone Encounter (Signed)
Reason for Disposition  Knee giving way (or buckling) when walking  Answer Assessment - Initial Assessment Questions 1. MECHANISM: "How did the injury happen?" (e.g., twisting injury, direct blow)      I fell and hurt my knee.   I don't need to go to the ED.   My insurance had me going to [facility].   I have a new insurance so I can go where I want to.    I need an MRI with anesthesia ordered by [name], NP.    I need a pulmonary referral.    I just fell outside. She needs to call in an MRI with anesthesia and also send a referral to a pulmonologist.    2. ONSET: "When did the injury happen?" (Minutes or hours ago)      I can't put weight on right knee.    I can't straighten it out.    3. LOCATION: "Where is the injury located?"      Right knee 4. APPEARANCE of INJURY: "What does the injury look like?"      I can't put weight on it.   I can't straighten it out.    5. SEVERITY: "Can you put weight on that leg?" "Can you walk?"      No  6. SIZE: For cuts, bruises, or swelling, ask: "How large is it?" (e.g., inches or centimeters;  entire joint)      No 7. PAIN: "Is there pain?" If Yes, ask: "How bad is the pain?"  "What does it keep you from doing?" (e.g., Scale 1-10; or mild, moderate, severe)   -  NONE: (0): no pain   -  MILD (1-3): doesn't interfere with normal activities    -  MODERATE (4-7): interferes with normal activities (e.g., work or school) or awakens from sleep, limping    -  SEVERE (8-10): excruciating pain, unable to do any normal activities, unable to walk     I using a rolling cart in my house. 8. TETANUS: For any breaks in the skin, ask: "When was the last tetanus booster?"     Not asked 9. OTHER SYMPTOMS: "Do you have any other symptoms?"  (e.g., "pop" when knee injured, swelling, locking, buckling)      Can't put weight on knee.  This is an on going problem.   But with my new insurance I can use whatever dr. I want.     10. PREGNANCY: "Is there any chance you are  pregnant?" "When was your last menstrual period?"       Not asked  Protocols used: Knee Injury-A-AH

## 2021-12-27 NOTE — Progress Notes (Signed)
Erroneous encounter-disregard

## 2022-01-02 ENCOUNTER — Telehealth: Payer: Self-pay | Admitting: Family

## 2022-01-02 ENCOUNTER — Ambulatory Visit: Payer: Self-pay | Admitting: *Deleted

## 2022-01-02 ENCOUNTER — Other Ambulatory Visit: Payer: Self-pay | Admitting: *Deleted

## 2022-01-02 DIAGNOSIS — G8929 Other chronic pain: Secondary | ICD-10-CM

## 2022-01-02 DIAGNOSIS — G479 Sleep disorder, unspecified: Secondary | ICD-10-CM

## 2022-01-02 DIAGNOSIS — J4540 Moderate persistent asthma, uncomplicated: Secondary | ICD-10-CM

## 2022-01-02 DIAGNOSIS — M545 Low back pain, unspecified: Secondary | ICD-10-CM

## 2022-01-02 NOTE — Telephone Encounter (Signed)
Let me know how I can further assist if needed.

## 2022-01-02 NOTE — Telephone Encounter (Signed)
Pt is calling to request a new referral to pulmonary. Pt is not able to be seen until November with [facility]. Pt reports that she had sx of wheezing and SOB of the weekend even though she has oxygen. Pt was transferred to [facility]. Please advise [phone]

## 2022-01-02 NOTE — Telephone Encounter (Signed)
  Chief Complaint: SOB Symptoms: SOB, out of some medications, requesting quicker referral to pulmonary( first available- November- patient states she needs now) Frequency: chronic problem- worse today Pertinent Negatives: Patient denies   Disposition: [x] ED /[] Urgent Care (no appt availability in office) / [] Appointment(In office/virtual)/ []  [place] [facility]/ [] Home Care/ [] Refused Recommended Disposition /[] [place] [facility]/ []  Follow-up with PCP Additional Notes: Patient called to request medication RF, change in referral- and stated she was not going to ED. After triage- patient advised due to O2 sat 89%- needs to go to ED- patient understands advised- states she will call back- patient advised no need- needs to go to ED.   Advair, Albuterol, Albuterol vials for nebulizer inhaler machine, Meloxicam, trazadone [facility] Reason for Disposition  Oxygen level (e.g., pulse oximetry) 90 percent or lower  Answer Assessment - Initial Assessment Questions 1. RESPIRATORY STATUS: "Describe your breathing?" (e.g., wheezing, shortness of breath, unable to speak, severe coughing)      Patient is running out of medication- she is requesting RF, difficulty caring conversation 2. ONSET: "When did this breathing problem begin?"      Chronic- change in weather 3. PATTERN "Does the difficult breathing come and go, or has it been constant since it started?"      Comes and goes- medication seems to help- but if patient gets respiratory infection it is worse 4. SEVERITY: "How bad is your breathing?" (e.g., mild, moderate, severe)    - MILD: No SOB at rest, mild SOB with walking, speaks normally in sentences, can lie down, no retractions, pulse < 100.    - MODERATE: SOB at rest, SOB with minimal exertion and prefers to sit, cannot lie down flat, speaks in phrases, mild retractions, audible wheezing, pulse 100-120.    - SEVERE: Very SOB at rest, speaks in single words, struggling to  breathe, sitting hunched forward, retractions, pulse > 120      Mild/moderate 5. RECURRENT SYMPTOM: "Have you had difficulty breathing before?" If Yes, ask: "When was the last time?" and "What happened that time?"      Yes- patient is on O2- 2-3 liters- having to use it during the day, tightness in chest, O2 sat 82- this morning 6. CARDIAC HISTORY: "Do you have any history of heart disease?" (e.g., heart attack, angina, bypass surgery, angioplasty)        7. LUNG HISTORY: "Do you have any history of lung disease?"  (e.g., pulmonary embolus, asthma, emphysema)     Chronic respiratory failure 8. CAUSE: "What do you think is causing the breathing problem?"        9. OTHER SYMPTOMS: "Do you have any other symptoms? (e.g., dizziness, runny nose, cough, chest pain, fever)     Chest tightness 10. O2 SATURATION MONITOR:  "Do you use an oxygen saturation monitor (pulse oximeter) at home?" If Yes, ask: "What is your reading (oxygen level) today?" "What is your usual oxygen saturation reading?" (e.g., 95%)       89% 11. PREGNANCY: "Is there any chance you are pregnant?" "When was your last menstrual period?"         12. TRAVEL: "Have you traveled out of the country in the last month?" (e.g., travel history, exposures)  Protocols used: Breathing Difficulty-A-AH

## 2022-01-02 NOTE — Telephone Encounter (Signed)
Patient called and triaged for breathing problems- advised ED for evaluation- low O2 sat- 89% Patient requesting refills on medications: Albuterol, Albuterol solution, Advair- unable to request through portal. Meloxicam, Trazodone. May need to send request for Advair to provider

## 2022-01-02 NOTE — Telephone Encounter (Signed)
Left message on voicemail to return call.  Advised on voicemail to pick up a OC package or have one mailed to address. To call back with instructions.   Advised would need to f/u with that process on voicemail.

## 2022-01-03 ENCOUNTER — Encounter: Payer: Self-pay | Admitting: Family

## 2022-01-03 NOTE — Telephone Encounter (Signed)
Requested medication (s) are due for refill today:   Yes for all 4.   Also requesting a refill on Advair  Requested medication (s) are on the active medication list:   Yes  Future visit scheduled:   Today (10/3) at 1:40 with [name] [name]   Last ordered: albuterol nebulizer 06/25/2021 150 ml, 5 refills;  albuterol inhaler 06/23/2021 18 g, 1 refill;  trazodone 08/02/2021 #30, 1 refill;  Mobic 09/26/2019 #30, 4 refill and Advair requested.  Returned because pt has an appt today with [name] at 1:40.  Meds to be addressed during OV.   Requested Prescriptions  Pending Prescriptions Disp Refills   albuterol (PROVENTIL) (2.5 MG/3ML) 0.083% nebulizer solution 150 mL 5    Sig: Take 3 mLs (2.5 mg total) by nebulization every 6 (six) hours as needed for wheezing or shortness of breath.     Pulmonology:  Beta Agonists 2 Passed - 01/02/2022 12:52 PM      Passed - Last BP in normal range    BP Readings from Last 1 Encounters:  08/25/21 112/88         Passed - Last Heart Rate in normal range    Pulse Readings from Last 1 Encounters:  08/25/21 88         Passed - Valid encounter within last 12 months    Recent Outpatient Visits           5 months ago Chronic frontal sinusitis   Primary Care at [facility], [name] J, NP   6 months ago Acute sinusitis, recurrence not specified, unspecified location   Primary Care at [facility], Cari S, PA-C   6 months ago    Primary Care at [facility], Cari S, PA-C   6 months ago    Primary Care at [facility], Cari S, PA-C   6 months ago Moderate persistent asthma, unspecified whether complicated   Primary Care at [facility], Kriste Basque, NP       Future Appointments             Today Camillia Herter, NP Primary Care at [facility]             albuterol (VENTOLIN HFA) 108 (90 Base) MCG/ACT inhaler 18 g 1    Sig: Inhale 2 puffs into the lungs every 6 (six) hours as needed for wheezing or shortness of  breath.     Pulmonology:  Beta Agonists 2 Passed - 01/02/2022 12:52 PM      Passed - Last BP in normal range    BP Readings from Last 1 Encounters:  08/25/21 112/88         Passed - Last Heart Rate in normal range    Pulse Readings from Last 1 Encounters:  08/25/21 88         Passed - Valid encounter within last 12 months    Recent Outpatient Visits           5 months ago Chronic frontal sinusitis   Primary Care at [facility], [name] J, NP   6 months ago Acute sinusitis, recurrence not specified, unspecified location   Primary Care at [facility], Cari S, PA-C   6 months ago    Primary Care at [facility], Cari S, PA-C   6 months ago    Primary Care at [facility], Cari S, PA-C   6 months ago Moderate persistent asthma, unspecified whether complicated  Primary Care at [facility], Kriste Basque, NP       Future Appointments             Today Camillia Herter, NP Primary Care at [facility]             traZODone (DESYREL) 50 MG tablet 30 tablet 1    Sig: Take 1 tablet (50 mg total) by mouth at bedtime.     Psychiatry: Antidepressants - Serotonin Modulator Passed - 01/02/2022 12:52 PM      Passed - Completed PHQ-2 or PHQ-9 in the last 360 days      Passed - Valid encounter within last 6 months    Recent Outpatient Visits           5 months ago Chronic frontal sinusitis   Primary Care at [facility], [name] J, NP   6 months ago Acute sinusitis, recurrence not specified, unspecified location   Primary Care at [facility], Cari S, PA-C   6 months ago    Primary Care at [facility], Cari S, PA-C   6 months ago    Primary Care at [facility], Cari S, PA-C   6 months ago Moderate persistent asthma, unspecified whether complicated   Primary Care at [facility], Kriste Basque, NP       Future Appointments             Today Camillia Herter, NP Primary Care at [facility]             meloxicam (MOBIC) 7.5 MG tablet 30 tablet 4    Sig: Take 1 tablet (7.5 mg total) by mouth daily. As needed for pain; take after eating     Analgesics:  COX2 Inhibitors Failed - 01/02/2022 12:52 PM      Failed - Manual Review: Labs are only required if the patient has taken medication for more than 8 weeks.      Failed - HGB in normal range and within 360 days    Hemoglobin  Date Value Ref Range Status  08/08/2019 11.5 (L) 12.0 - 15.0 g/dL Final         Failed - Cr in normal range and within 360 days    Creatinine, Ser  Date Value Ref Range Status  08/08/2019 1.07 (H) 0.44 - 1.00 mg/dL Final         Failed - HCT in normal range and within 360 days    HCT  Date Value Ref Range Status  08/08/2019 39.9 36.0 - 46.0 % Final         Failed - AST in normal range and within 360 days    AST  Date Value Ref Range Status  07/27/2018 17 15 - 41 U/L Final         Failed - ALT in normal range and within 360 days    ALT  Date Value Ref Range Status  07/27/2018 16 0 - 44 U/L Final         Failed - eGFR is 30 or above and within 360 days    GFR calc Af Amer  Date Value Ref Range Status  08/08/2019 >60 >60 mL/min Final   GFR calc non Af Amer  Date Value Ref Range Status  08/08/2019 56 (L) >60 mL/min Final         Passed - Patient is not pregnant      Passed - Valid encounter within last 12  months    Recent Outpatient Visits           5 months ago Chronic frontal sinusitis   Primary Care at [facility], [name] J, NP   6 months ago Acute sinusitis, recurrence not specified, unspecified location   Primary Care at [facility], Cari S, PA-C   6 months ago    Primary Care at [facility], Loraine Grip, PA-C   6 months ago    Primary Care at [facility], Cari S, PA-C   6 months ago Moderate persistent asthma, unspecified whether complicated   Primary Care at [facility] Kriste Basque, NP       Future Appointments              Today Camillia Herter, NP Primary Care at [facility]

## 2022-01-04 ENCOUNTER — Other Ambulatory Visit (INDEPENDENT_AMBULATORY_CARE_PROVIDER_SITE_OTHER): Payer: Self-pay | Admitting: *Deleted

## 2022-01-04 DIAGNOSIS — G479 Sleep disorder, unspecified: Secondary | ICD-10-CM

## 2022-01-04 DIAGNOSIS — J4520 Mild intermittent asthma, uncomplicated: Secondary | ICD-10-CM

## 2022-01-04 DIAGNOSIS — J4540 Moderate persistent asthma, uncomplicated: Secondary | ICD-10-CM

## 2022-01-04 NOTE — Telephone Encounter (Signed)
Patient requesting refill of medications and requesting z- pack to be ordered for shortness of breath episodes. Denies difficulty breathing now. Reports she did not go to UC/ED as recommended on 01/02/22 due to no transportation. [facility] My Chart VV scheduled 01/10/22. Please advise.

## 2022-01-04 NOTE — Progress Notes (Signed)
Virtual Visit via Telephone Note  I connected with Janice Ryan, on 01/10/2022 at 4:29 PM by telephone and verified that I am speaking with the correct person using two identifiers.  Consent: I discussed the limitations, risks, security and privacy concerns of performing an evaluation and management service by telephone and the availability of in person appointments. I also discussed with the patient that there may be a patient responsible charge related to this service. The patient expressed understanding and agreed to proceed.   Location of Patient: Home  Location of Provider: [facility] [place] participating in Telemedicine visit: Tara Divincenzo [name], NP Elmon Else, CMA   History of Present Illness: Janice Ryan is a 66 year-old female who presents for medication refills.   01/02/2022 per Blase Mess note: Pt is calling to request a new referral to pulmonary. Pt is not able to be seen until November with [facility]. Pt reports that she had sx of wheezing and SOB of the weekend even though she has oxygen. Pt was transferred to NT. Please advise [phone]  01/02/2022 per Carilyn Goodpasture, RN note: Left message on voicemail to return call.  Advised on voicemail to pick up a OC package or have one mailed to address. To call back with instructions.   Advised would need to f/u with that process on voicemail.   01/02/2022 per triage RN note: Chief Complaint: SOB Symptoms: SOB, out of some medications, requesting quicker referral to pulmonary( first available- November- patient states she needs now) Frequency: chronic problem- worse today Pertinent Negatives: Patient denies   Disposition: [x] ED /[] Urgent Care (no appt availability in office) / [] Appointment(In office/virtual)/ []  [place] [facility]/ [] Home Care/ [] Refused Recommended Disposition /[] [place] [facility]/ []  Follow-up with  PCP Additional Notes: Patient called to request medication RF, change in referral- and stated she was not going to ED. After triage- patient advised due to O2 sat 89%- needs to go to ED- patient understands advised- states she will call back- patient advised no need- needs to go to ED.    Advair, Albuterol, Albuterol vials for nebulizer inhaler machine, Meloxicam, trazadone [facility] Reason for Disposition  Oxygen level (e.g., pulse oximetry) 90 percent or lower  Answer Assessment - Initial Assessment Questions 1. RESPIRATORY STATUS: "Describe your breathing?" (e.g., wheezing, shortness of breath, unable to speak, severe coughing)      Patient is running out of medication- she is requesting RF, difficulty caring conversation 2. ONSET: "When did this breathing problem begin?"      Chronic- change in weather 3. PATTERN "Does the difficult breathing come and go, or has it been constant since it started?"      Comes and goes- medication seems to help- but if patient gets respiratory infection it is worse 4. SEVERITY: "How bad is your breathing?" (e.g., mild, moderate, severe)    - MILD: No SOB at rest, mild SOB with walking, speaks normally in sentences, can lie down, no retractions, pulse < 100.    - MODERATE: SOB at rest, SOB with minimal exertion and prefers to sit, cannot lie down flat, speaks in phrases, mild retractions, audible wheezing, pulse 100-120.    - SEVERE: Very SOB at rest, speaks in single words, struggling to breathe, sitting hunched forward, retractions, pulse > 120      Mild/moderate 5. RECURRENT SYMPTOM: "Have you had difficulty breathing before?" If Yes, ask: "When was the last time?" and "What happened that time?"  Yes- patient is on O2- 2-3 liters- having to use it during the day, tightness in chest, O2 sat 82- this morning 6. CARDIAC HISTORY: "Do you have any history of heart disease?" (e.g., heart attack, angina, bypass surgery, angioplasty)        7.  LUNG HISTORY: "Do you have any history of lung disease?"  (e.g., pulmonary embolus, asthma, emphysema)     Chronic respiratory failure 8. CAUSE: "What do you think is causing the breathing problem?"        9. OTHER SYMPTOMS: "Do you have any other symptoms? (e.g., dizziness, runny nose, cough, chest pain, fever)     Chest tightness 10. O2 SATURATION MONITOR:  "Do you use an oxygen saturation monitor (pulse oximeter) at home?" If Yes, ask: "What is your reading (oxygen level) today?" "What is your usual oxygen saturation reading?" (e.g., 95%)       89% 11. PREGNANCY: "Is there any chance you are pregnant?" "When was your last menstrual period?"         12. TRAVEL: "Have you traveled out of the country in the last month?" (e.g., travel history, exposures)  Protocols used: Breathing Difficulty-A-AH  Today's visit 01/10/2022: - Reports recently went to [facility]. States she was treated for a sinus infection with a Z-pack and Amoxicillin. States she was only given a 3 day course. Reports when she asked for refills from them they directed her to primary care for refills.  - Needs refills on asthma medications. - Has appointment scheduled November 2023 with Pulmonology. Requesting a new referral to the same with the goal of getting a sooner appointment. States she is supposed to use oxygen 2 to 3 liters per minute during bedtime. However, having to wear oxygen throughout the day. She is wheelchair bound and would like to have portable oxygen at some point in the future. Home O2 saturation normal while wearing oxygen. - Scheduled for an MRI by Orthopedics soon.    Past Medical History:  Diagnosis Date   Acute respiratory failure with hypoxia (HCC)    Asthma    Hypertension    Morbid obesity (HCC)    Allergies  Allergen Reactions   Gabapentin Other (See Comments)    Has un-controlled movement of her body, excessive dribble of saliva at night   Sulfa Antibiotics Nausea And Vomiting and  Other (See Comments)    Stomach pain    Current Outpatient Medications on File Prior to Visit  Medication Sig Dispense Refill   albuterol (PROVENTIL) (2.5 MG/3ML) 0.083% nebulizer solution Take 3 mLs (2.5 mg total) by nebulization every 6 (six) hours as needed for wheezing or shortness of breath. 150 mL 5   albuterol (VENTOLIN HFA) 108 (90 Base) MCG/ACT inhaler Inhale 2 puffs into the lungs every 6 (six) hours as needed for wheezing or shortness of breath. 18 g 1   azithromycin (ZITHROMAX) 250 MG tablet Take 2 tabs PO day 1, then take 1 tab PO once daily 6 tablet 0   benzonatate (TESSALON PERLES) 100 MG capsule Take 2 capsules (200 mg total) by mouth 3 (three) times daily as needed for cough. 30 capsule 0   busPIRone (BUSPAR) 5 MG tablet TAKE ONE TABLET BY MOUTH THREE TIMES A DAY (Patient not taking: Reported on 07/06/2021) 90 tablet 1   carbamide peroxide (DEBROX) 6.5 % OTIC solution Place 5 drops into both ears 2 (two) times daily. Do not use more than 4 days in a row. (Patient not taking: Reported on 07/06/2021)  15 mL 0   cetirizine (ZYRTEC ALLERGY) 10 MG tablet Take 1 tablet (10 mg total) by mouth daily. 30 tablet 11   Cholecalciferol (VITAMIN D3 PO) Take 1 tablet by mouth daily.     diclofenac Sodium (VOLTAREN) 1 % GEL Apply 2 g topically 4 (four) times daily as needed. 350 g 0   DULoxetine (CYMBALTA) 60 MG capsule Take 1 capsule (60 mg total) by mouth daily. (Patient taking differently: Take 120 mg by mouth daily.) 30 capsule 2   erythromycin ophthalmic ointment Place 1 application into the right eye at bedtime. (Patient not taking: Reported on 07/06/2021) 3.5 g 0   Ferrous Sulfate (IRON PO) Take 1 tablet by mouth every other day. (Patient not taking: Reported on 07/06/2021)     Fluticasone-Salmeterol (ADVAIR) 500-50 MCG/DOSE AEPB Inhale 1 puff into the lungs 2 (two) times daily. 60 each 2   furosemide (LASIX) 20 MG tablet Take 1 tablet (20 mg total) by mouth daily. (Patient not taking: Reported on  08/08/2019) 30 tablet 1   gabapentin (NEURONTIN) 100 MG capsule TAKE ONE CAPSULE BY MOUTH THREE TIMES A DAY (Patient not taking: Reported on 07/06/2021) 90 capsule 1   lisinopril (ZESTRIL) 20 MG tablet Take 1 tablet (20 mg total) by mouth daily. 90 tablet 0   meloxicam (MOBIC) 7.5 MG tablet Take 1 tablet (7.5 mg total) by mouth daily. As needed for pain; take after eating 30 tablet 4   montelukast (SINGULAIR) 10 MG tablet TAKE ONE TABLET BY MOUTH EVERY NIGHT AT BEDTIME (Patient not taking: Reported on 07/06/2021) 90 tablet 1   Multiple Vitamin (MULTIVITAMIN WITH MINERALS) TABS tablet Take 1 tablet by mouth daily.     nystatin (MYCOSTATIN/NYSTOP) powder Apply 1 application topically 3 (three) times daily. (Patient not taking: Reported on 08/11/2019) 15 g 0   nystatin cream (MYCOSTATIN) Apply to affected area 2 times daily as needed for fungal rash (Patient not taking: Reported on 07/06/2021) 30 g 3   OXYGEN Inhale into the lungs at bedtime as needed (2 L).     traZODone (DESYREL) 50 MG tablet Take 1 tablet (50 mg total) by mouth at bedtime. 30 tablet 1   No current facility-administered medications on file prior to visit.    Observations/Objective: Alert and oriented x 3. Not in acute distress. Physical examination not completed as this is a telemedicine visit.  Assessment and Plan: 1. Chronic frontal sinusitis - Azithromycin and  Amoxicillin-Clavulanate as prescribed.  - Follow-up with primary provider as scheduled. - azithromycin (ZITHROMAX) 250 MG tablet; Take 2 tabs PO day 1, then take 1 tab PO once daily  Dispense: 6 tablet; Refill: 0 - amoxicillin-clavulanate (AUGMENTIN) 875-125 MG tablet; Take 1 tablet by mouth 2 (two) times daily for 5 days.  Dispense: 10 tablet; Refill: 0  2. Moderate persistent asthma, unspecified whether complicated 3. Oxygen dependent - Continue Albuterol nebulizer, Albuterol inhaler, and Fluticasone-Salmeterol inhaler as prescribed.  - Per patient request referral to  Pulmonology for further evaluation and management. Counseled patient to keep current appointment scheduled 02/09/2022 with Pulmonology should new referral be unable to see patient sooner. Patient verbalized understanding.  - albuterol (PROVENTIL) (2.5 MG/3ML) 0.083% nebulizer solution; Take 3 mLs (2.5 mg total) by nebulization every 6 (six) hours as needed for wheezing or shortness of breath.  Dispense: 150 mL; Refill: 2 - albuterol (VENTOLIN HFA) 108 (90 Base) MCG/ACT inhaler; Inhale 2 puffs into the lungs every 6 (six) hours as needed for wheezing or shortness of breath.  Dispense:  18 g; Refill: 2 - fluticasone-salmeterol (ADVAIR) 500-50 MCG/ACT AEPB; Inhale 1 puff into the lungs in the morning and at bedtime.  Dispense: 60 each; Refill: 2 - Ambulatory referral to Pulmonology   Follow Up Instructions: Referral to Pulmonology. Follow-up with primary provider as scheduled.    Patient was given clear instructions to go to Emergency Department or return to medical center if symptoms don't improve, worsen, or new problems develop.The patient verbalized understanding.  I discussed the assessment and treatment plan with the patient. The patient was provided an opportunity to ask questions and all were answered. The patient agreed with the plan and demonstrated an understanding of the instructions.   The patient was advised to call back or seek an in-person evaluation if the symptoms worsen or if the condition fails to improve as anticipated.     I provided 20 minutes total of non-face-to-face time during this encounter.   Camillia Herter, NP  [facility] [place], [place] 01/10/2022, 4:29 PM

## 2022-01-06 ENCOUNTER — Other Ambulatory Visit: Payer: Self-pay | Admitting: *Deleted

## 2022-01-06 ENCOUNTER — Other Ambulatory Visit: Payer: Self-pay | Admitting: Family

## 2022-01-06 ENCOUNTER — Ambulatory Visit: Payer: Self-pay

## 2022-01-06 DIAGNOSIS — J4540 Moderate persistent asthma, uncomplicated: Secondary | ICD-10-CM

## 2022-01-06 DIAGNOSIS — I10 Essential (primary) hypertension: Secondary | ICD-10-CM

## 2022-01-06 DIAGNOSIS — J321 Chronic frontal sinusitis: Secondary | ICD-10-CM

## 2022-01-06 MED ORDER — ALBUTEROL SULFATE (2.5 MG/3ML) 0.083% IN NEBU: 2.5000 mg | INHALATION_SOLUTION | Freq: Four times a day (QID) | RESPIRATORY_TRACT | 5 refills | Status: DC | PRN

## 2022-01-06 MED ORDER — LISINOPRIL 20 MG PO TABS: 20.0000 mg | ORAL_TABLET | Freq: Every day | ORAL | 0 refills | Status: DC

## 2022-01-06 NOTE — Telephone Encounter (Signed)
Requested medication (s) are due for refill today:   Yes for all 4.   Also requesting a refill on Advair   Requested medication (s) are on the active medication list:   Yes   Future visit scheduled:   01/10/22 for VV   Last ordered: Pt is asking if she can get refills since she has appt scheduled on 01/10/22.   albuterol nebulizer 06/25/2021 150 ml, 5 refills;  albuterol inhaler 06/23/2021 18 g, 1 refill;  trazodone 08/02/2021 #30, 1 refill;  Mobic 09/26/2019 #30, 4 refill and Advair requested    Requested Prescriptions  Pending Prescriptions Disp Refills   albuterol (PROVENTIL) (2.5 MG/3ML) 0.083% nebulizer solution 150 mL 5    Sig: Take 3 mLs (2.5 mg total) by nebulization every 6 (six) hours as needed for wheezing or shortness of breath.     Pulmonology:  Beta Agonists 2 Passed - 01/06/2022  1:17 PM      Passed - Last BP in normal range    BP Readings from Last 1 Encounters:  08/25/21 112/88         Passed - Last Heart Rate in normal range    Pulse Readings from Last 1 Encounters:  08/25/21 88         Passed - Valid encounter within last 12 months    Recent Outpatient Visits           5 months ago Chronic frontal sinusitis   Primary Care at [facility], [name] J, NP   6 months ago Acute sinusitis, recurrence not specified, unspecified location   Primary Care at [facility], Cari S, PA-C   6 months ago    Primary Care at [facility], Cari S, PA-C   6 months ago    Primary Care at [facility], Cari S, PA-C   6 months ago Moderate persistent asthma, unspecified whether complicated   Primary Care at [facility], Gildardo Pounds, NP       Future Appointments             In 4 days [name], NP Primary Care at [facility]             albuterol (VENTOLIN HFA) 108 (90 Base) MCG/ACT inhaler 18 g 1    Sig: Inhale 2 puffs into the lungs every 6 (six) hours as needed for wheezing or shortness of breath.     Pulmonology:   Beta Agonists 2 Passed - 01/06/2022  1:17 PM      Passed - Last BP in normal range    BP Readings from Last 1 Encounters:  08/25/21 112/88         Passed - Last Heart Rate in normal range    Pulse Readings from Last 1 Encounters:  08/25/21 88         Passed - Valid encounter within last 12 months    Recent Outpatient Visits           5 months ago Chronic frontal sinusitis   Primary Care at [facility], [name] J, NP   6 months ago Acute sinusitis, recurrence not specified, unspecified location   Primary Care at [facility], Cari S, PA-C   6 months ago    Primary Care at [facility], Cari S, PA-C   6 months ago    Primary Care at [facility], Cari S, PA-C   6 months ago Moderate persistent asthma, unspecified whether complicated  Primary Care at [facility], Gildardo Pounds, NP       Future Appointments             In 4 days [name], NP Primary Care at [facility]             montelukast (SINGULAIR) 10 MG tablet 90 tablet 1    Sig: Take 1 tablet (10 mg total) by mouth at bedtime.     Pulmonology:  Leukotriene Inhibitors Passed - 01/06/2022  1:17 PM      Passed - Valid encounter within last 12 months    Recent Outpatient Visits           5 months ago Chronic frontal sinusitis   Primary Care at [facility], [name] J, NP   6 months ago Acute sinusitis, recurrence not specified, unspecified location   Primary Care at [facility], Cari S, PA-C   6 months ago    Primary Care at [facility], Cari S, PA-C   6 months ago    Primary Care at [facility], Cari S, PA-C   6 months ago Moderate persistent asthma, unspecified whether complicated   Primary Care at [facility], Gildardo Pounds, NP       Future Appointments             In 4 days [name], NP Primary Care at [facility]             traZODone (DESYREL) 50 MG tablet 30 tablet 1    Sig: Take 1 tablet  (50 mg total) by mouth at bedtime.     Psychiatry: Antidepressants - Serotonin Modulator Passed - 01/06/2022  1:17 PM      Passed - Completed PHQ-2 or PHQ-9 in the last 360 days      Passed - Valid encounter within last 6 months    Recent Outpatient Visits           5 months ago Chronic frontal sinusitis   Primary Care at [facility], [name] J, NP   6 months ago Acute sinusitis, recurrence not specified, unspecified location   Primary Care at [facility], Cari S, PA-C   6 months ago    Primary Care at [facility] Cari S, PA-C   6 months ago    Primary Care at [facility], Cari S, PA-C   6 months ago Moderate persistent asthma, unspecified whether complicated   Primary Care at [facility], Gildardo Pounds, NP       Future Appointments             In 4 days [name], NP Primary Care at [facility] Prescriptions Disp Refills   traZODone (DESYREL) 50 MG tablet 30 tablet 1    Sig: Take 1 tablet (50 mg total) by mouth at bedtime.     Psychiatry: Antidepressants - Serotonin Modulator Passed - 01/06/2022  1:17 PM      Passed - Completed PHQ-2 or PHQ-9 in the last 360 days      Passed - Valid encounter within last 6 months    Recent Outpatient Visits           5 months ago Chronic frontal sinusitis   Primary Care at [facility], [name] J, NP   6 months ago Acute sinusitis, recurrence not specified, unspecified location   Primary  Care at [place], PA-C   6 months ago    Primary Care at [facility], Cari S, PA-C   6 months ago    Primary Care at [facility], Cari S, PA-C   6 months ago Moderate persistent asthma, unspecified whether complicated   Primary Care at [facility], Kriste Basque, NP       Future Appointments             In 4 days Camillia Herter, NP Primary Care at [facility]             montelukast (SINGULAIR) 10 MG tablet  90 tablet 1    Sig: Take 1 tablet (10 mg total) by mouth at bedtime.     Pulmonology:  Leukotriene Inhibitors Passed - 01/06/2022  1:17 PM      Passed - Valid encounter within last 12 months    Recent Outpatient Visits           5 months ago Chronic frontal sinusitis   Primary Care at [facility] [name] J, NP   6 months ago Acute sinusitis, recurrence not specified, unspecified location   Primary Care at [facility], Cari S, PA-C   6 months ago    Primary Care at [facility], Cari S, PA-C   6 months ago    Primary Care at [facility] Cari S, PA-C   6 months ago Moderate persistent asthma, unspecified whether complicated   Primary Care at [facility], Kriste Basque, NP       Future Appointments             In 4 days Camillia Herter, NP Primary Care at [facility]             albuterol (VENTOLIN HFA) 108 (90 Base) MCG/ACT inhaler 18 g 1    Sig: Inhale 2 puffs into the lungs every 6 (six) hours as needed for wheezing or shortness of breath.     Pulmonology:  Beta Agonists 2 Passed - 01/06/2022  1:17 PM      Passed - Last BP in normal range    BP Readings from Last 1 Encounters:  08/25/21 112/88         Passed - Last Heart Rate in normal range    Pulse Readings from Last 1 Encounters:  08/25/21 88         Passed - Valid encounter within last 12 months    Recent Outpatient Visits           5 months ago Chronic frontal sinusitis   Primary Care at [facility], [name] J, NP   6 months ago Acute sinusitis, recurrence not specified, unspecified location   Primary Care at [facility], Cari S, PA-C   6 months ago    Primary Care at [facility], Cari S, PA-C   6 months ago    Primary Care at [facility], Cari S, PA-C   6 months ago Moderate persistent asthma, unspecified whether complicated   Primary Care at [facility], Kriste Basque, NP       Future Appointments              In 4 days Camillia Herter, NP Primary Care at [facility]             albuterol (PROVENTIL) (2.5 MG/3ML) 0.083% nebulizer solution 150 mL 5  Sig: Take 3 mLs (2.5 mg total) by nebulization every 6 (six) hours as needed for wheezing or shortness of breath.     Pulmonology:  Beta Agonists 2 Passed - 01/06/2022  1:17 PM      Passed - Last BP in normal range    BP Readings from Last 1 Encounters:  08/25/21 112/88         Passed - Last Heart Rate in normal range    Pulse Readings from Last 1 Encounters:  08/25/21 88         Passed - Valid encounter within last 12 months    Recent Outpatient Visits           5 months ago Chronic frontal sinusitis   Primary Care at [facility], [name] J, NP   6 months ago Acute sinusitis, recurrence not specified, unspecified location   Primary Care at [facility], Cari S, PA-C   6 months ago    Primary Care at [facility], Cari S, PA-C   6 months ago    Primary Care at [facility], Cari S, PA-C   6 months ago Moderate persistent asthma, unspecified whether complicated   Primary Care at [facility], Gildardo Pounds, NP       Future Appointments             In 4 days [name], NP Primary Care at [facility]

## 2022-01-06 NOTE — Telephone Encounter (Signed)
Rx sent for albuterol MDI and Nebs.  Needs OV for Advair refill.  Has a virtual apt next week.

## 2022-01-06 NOTE — Telephone Encounter (Signed)
       Chief Complaint: SOB. "I WILL not go ED, I can't afford to and my husband works, I don't have a ride." Requests Advair inhaler, Albuterol for nebulizer be refilled  Symptoms: Cough, SOB Frequency:  Pertinent Negatives: Patient denies fever Disposition: [] ED /[] Urgent Care (no appt availability in office) / [] Appointment(In office/virtual)/ []  [place] [facility]/ [] Home Care/ [] Refused Recommended Disposition /[] [facility]/ [x]  Follow-up with PCP Additional Notes: Please advise pt. Answer Assessment - Initial Assessment Questions 1. RESPIRATORY STATUS: "Describe your breathing?" (e.g., wheezing, shortness of breath, unable to speak, severe coughing)      SOB 2. ONSET: "When did this breathing problem begin?"      Constant 3. PATTERN "Does the difficult breathing come and go, or has it been constant since it started?"      Comes and goes 4. SEVERITY: "How bad is your breathing?" (e.g., mild, moderate, severe)    - MILD: No SOB at rest, mild SOB with walking, speaks normally in sentences, can lie down, no retractions, pulse < 100.    - MODERATE: SOB at rest, SOB with minimal exertion and prefers to sit, cannot lie down flat, speaks in phrases, mild retractions, audible wheezing, pulse 100-120.    - SEVERE: Very SOB at rest, speaks in single words, struggling to breathe, sitting hunched forward, retractions, pulse > 120      Mld to moderate 5. RECURRENT SYMPTOM: "Have you had difficulty breathing before?" If Yes, ask: "When was the last time?" and "What happened that time?"      Yes 6. CARDIAC HISTORY: "Do you have any history of heart disease?" (e.g., heart attack, angina, bypass surgery, angioplasty)      Yes 7. LUNG HISTORY: "Do you have any history of lung disease?"  (e.g., pulmonary embolus, asthma, emphysema)     Yes 8. CAUSE: "What do you think is causing the breathing problem?"      I need refills on my medications to feel better 9. OTHER SYMPTOMS: "Do you  have any other symptoms? (e.g., dizziness, runny nose, cough, chest pain, fever)     No 10. O2 SATURATION MONITOR:  "Do you use an oxygen saturation monitor (pulse oximeter) at home?" If Yes, ask: "What is your reading (oxygen level) today?" "What is your usual oxygen saturation reading?" (e.g., 95%)       No 11. PREGNANCY: "Is there any chance you are pregnant?" "When was your last menstrual period?"       no 12. TRAVEL: "Have you traveled out of the country in the last month?" (e.g., travel history, exposures)       No  Protocols used: Breathing Difficulty-A-AH

## 2022-01-06 NOTE — Telephone Encounter (Addendum)
Patient called in checking status of these refills,  as well as advair, notes saying may have to order from company. . traZODone HCl 50 mg    Montelukast Sodium 10 mg Oral    Albuterol Sulfate 2 puffs Inhalation   Albuterol Sulfate 2.5 mg Nebulization   [id number] [address], [place] [facility] [place] Phone:  [phone]  Fax:  [phone]

## 2022-01-06 NOTE — Telephone Encounter (Signed)
Medication Refill - Medication: lisinopril (ZESTRIL) 20 MG tablet  Has the patient contacted their pharmacy? yes (Agent: If no, request that the patient contact the pharmacy for the refill. If patient does not wish to contact the pharmacy document the reason why and proceed with request.) (Agent: If yes, when and what did the pharmacy advise?)contact pcp  Preferred Pharmacy (with phone number or street name): [id number] [place], [place] [address]  [place], [place] [address]  Phone:  [phone]  Fax:  [phone]  Has the patient been seen for an appointment in the last year OR does the patient have an upcoming appointment? yes  Agent: Please be advised that RX refills may take up to 3 business days. We ask that you follow-up with your pharmacy.

## 2022-01-06 NOTE — Telephone Encounter (Signed)
Requested medications are due for refill today.  yes  Requested medications are on the active medications list.  yes  Last refill. 10/20/2021 #90 0 rf  Future visit scheduled.   yes  Notes to clinic.  Failed protocol d/t expired labs.    Requested Prescriptions  Pending Prescriptions Disp Refills   lisinopril (ZESTRIL) 20 MG tablet 90 tablet 0    Sig: Take 1 tablet (20 mg total) by mouth daily.     Cardiovascular:  ACE Inhibitors Failed - 01/06/2022  1:15 PM      Failed - Cr in normal range and within 180 days    Creatinine, Ser  Date Value Ref Range Status  08/08/2019 1.07 (H) 0.44 - 1.00 mg/dL Final         Failed - K in normal range and within 180 days    Potassium  Date Value Ref Range Status  08/08/2019 4.0 3.5 - 5.1 mmol/L Final         Passed - Patient is not pregnant      Passed - Last BP in normal range    BP Readings from Last 1 Encounters:  08/25/21 112/88         Passed - Valid encounter within last 6 months    Recent Outpatient Visits           5 months ago Chronic frontal sinusitis   Primary Care at [facility], [name] J, NP   6 months ago Acute sinusitis, recurrence not specified, unspecified location   Primary Care at [facility], Cari S, PA-C   6 months ago    Primary Care at [facility], Cari S, PA-C   6 months ago    Primary Care at [facility], Cari S, PA-C   6 months ago Moderate persistent asthma, unspecified whether complicated   Primary Care at [facility], Kriste Basque, NP       Future Appointments             In 4 days Camillia Herter, NP Primary Care at [facility]

## 2022-01-06 NOTE — Addendum Note (Signed)
Addended by: Erie Noe on: 01/06/2022 01:17 PM   Modules accepted: Orders

## 2022-01-10 ENCOUNTER — Ambulatory Visit (INDEPENDENT_AMBULATORY_CARE_PROVIDER_SITE_OTHER): Payer: Self-pay | Admitting: Family

## 2022-01-10 DIAGNOSIS — J4540 Moderate persistent asthma, uncomplicated: Secondary | ICD-10-CM

## 2022-01-10 DIAGNOSIS — Z9981 Dependence on supplemental oxygen: Secondary | ICD-10-CM

## 2022-01-10 DIAGNOSIS — J321 Chronic frontal sinusitis: Secondary | ICD-10-CM

## 2022-01-10 MED ORDER — AMOXICILLIN-POT CLAVULANATE 875-125 MG PO TABS: 1.0000 | ORAL_TABLET | Freq: Two times a day (BID) | ORAL | 0 refills | Status: AC

## 2022-01-10 MED ORDER — ALBUTEROL SULFATE (2.5 MG/3ML) 0.083% IN NEBU: 2.5000 mg | INHALATION_SOLUTION | Freq: Four times a day (QID) | RESPIRATORY_TRACT | 2 refills | Status: DC | PRN

## 2022-01-10 MED ORDER — FLUTICASONE-SALMETEROL 500-50 MCG/ACT IN AEPB: 1.0000 | INHALATION_SPRAY | Freq: Two times a day (BID) | RESPIRATORY_TRACT | 2 refills | Status: DC

## 2022-01-10 MED ORDER — ALBUTEROL SULFATE HFA 108 (90 BASE) MCG/ACT IN AERS: 2.0000 | INHALATION_SPRAY | Freq: Four times a day (QID) | RESPIRATORY_TRACT | 2 refills | Status: DC | PRN

## 2022-01-10 MED ORDER — AZITHROMYCIN 250 MG PO TABS: ORAL_TABLET | ORAL | 0 refills | Status: DC

## 2022-01-20 ENCOUNTER — Ambulatory Visit: Payer: Self-pay

## 2022-01-20 ENCOUNTER — Inpatient Hospital Stay (HOSPITAL_COMMUNITY)
Admission: EM | Admit: 2022-01-20 | Discharge: 2022-01-29 | DRG: 189 | Disposition: A | Discharge status: Home Health | Payer: BLUE CROSS/BLUE SHIELD | Attending: Family Medicine | Admitting: Family Medicine

## 2022-01-20 ENCOUNTER — Encounter (HOSPITAL_COMMUNITY): Payer: Self-pay | Admitting: Emergency Medicine

## 2022-01-20 ENCOUNTER — Emergency Department (HOSPITAL_COMMUNITY): Payer: BLUE CROSS/BLUE SHIELD

## 2022-01-20 DIAGNOSIS — W010XXA Fall on same level from slipping, tripping and stumbling without subsequent striking against object, initial encounter: Secondary | ICD-10-CM | POA: Diagnosis present

## 2022-01-20 DIAGNOSIS — Z609 Problem related to social environment, unspecified: Secondary | ICD-10-CM

## 2022-01-20 DIAGNOSIS — Z79899 Other long term (current) drug therapy: Secondary | ICD-10-CM

## 2022-01-20 DIAGNOSIS — J9621 Acute and chronic respiratory failure with hypoxia: Principal | ICD-10-CM | POA: Diagnosis present

## 2022-01-20 DIAGNOSIS — J9602 Acute respiratory failure with hypercapnia: Principal | ICD-10-CM

## 2022-01-20 DIAGNOSIS — G9341 Metabolic encephalopathy: Secondary | ICD-10-CM | POA: Diagnosis present

## 2022-01-20 DIAGNOSIS — J4540 Moderate persistent asthma, uncomplicated: Secondary | ICD-10-CM | POA: Diagnosis present

## 2022-01-20 DIAGNOSIS — Z993 Dependence on wheelchair: Secondary | ICD-10-CM

## 2022-01-20 DIAGNOSIS — Z7951 Long term (current) use of inhaled steroids: Secondary | ICD-10-CM

## 2022-01-20 DIAGNOSIS — E662 Morbid (severe) obesity with alveolar hypoventilation: Secondary | ICD-10-CM | POA: Diagnosis present

## 2022-01-20 DIAGNOSIS — Z659 Problem related to unspecified psychosocial circumstances: Secondary | ICD-10-CM

## 2022-01-20 DIAGNOSIS — N179 Acute kidney failure, unspecified: Secondary | ICD-10-CM

## 2022-01-20 DIAGNOSIS — E8729 Other acidosis: Secondary | ICD-10-CM | POA: Diagnosis present

## 2022-01-20 DIAGNOSIS — Z6841 Body Mass Index (BMI) 40.0 and over, adult: Secondary | ICD-10-CM

## 2022-01-20 DIAGNOSIS — I10 Essential (primary) hypertension: Secondary | ICD-10-CM | POA: Diagnosis present

## 2022-01-20 DIAGNOSIS — F330 Major depressive disorder, recurrent, mild: Secondary | ICD-10-CM | POA: Diagnosis present

## 2022-01-20 DIAGNOSIS — I110 Hypertensive heart disease with heart failure: Secondary | ICD-10-CM | POA: Diagnosis present

## 2022-01-20 DIAGNOSIS — J9622 Acute and chronic respiratory failure with hypercapnia: Secondary | ICD-10-CM | POA: Diagnosis present

## 2022-01-20 DIAGNOSIS — J189 Pneumonia, unspecified organism: Secondary | ICD-10-CM

## 2022-01-20 DIAGNOSIS — I5032 Chronic diastolic (congestive) heart failure: Secondary | ICD-10-CM | POA: Diagnosis present

## 2022-01-20 DIAGNOSIS — D7589 Other specified diseases of blood and blood-forming organs: Secondary | ICD-10-CM | POA: Diagnosis present

## 2022-01-20 DIAGNOSIS — Z20822 Contact with and (suspected) exposure to covid-19: Secondary | ICD-10-CM | POA: Diagnosis present

## 2022-01-20 DIAGNOSIS — M25561 Pain in right knee: Secondary | ICD-10-CM | POA: Diagnosis present

## 2022-01-20 DIAGNOSIS — Z9981 Dependence on supplemental oxygen: Secondary | ICD-10-CM

## 2022-01-20 DIAGNOSIS — G8929 Other chronic pain: Secondary | ICD-10-CM | POA: Diagnosis present

## 2022-01-20 DIAGNOSIS — Z8249 Family history of ischemic heart disease and other diseases of the circulatory system: Secondary | ICD-10-CM

## 2022-01-20 DIAGNOSIS — W19XXXA Unspecified fall, initial encounter: Secondary | ICD-10-CM

## 2022-01-20 DIAGNOSIS — Y92009 Unspecified place in unspecified non-institutional (private) residence as the place of occurrence of the external cause: Secondary | ICD-10-CM

## 2022-01-20 DIAGNOSIS — E875 Hyperkalemia: Secondary | ICD-10-CM

## 2022-01-20 DIAGNOSIS — Z96652 Presence of left artificial knee joint: Secondary | ICD-10-CM | POA: Diagnosis present

## 2022-01-20 DIAGNOSIS — M797 Fibromyalgia: Secondary | ICD-10-CM | POA: Diagnosis present

## 2022-01-20 HISTORY — DX: Obstructive sleep apnea (adult) (pediatric): G47.33

## 2022-01-20 HISTORY — DX: Morbid (severe) obesity with alveolar hypoventilation: E66.2

## 2022-01-20 LAB — URINALYSIS, ROUTINE W REFLEX MICROSCOPIC
Bacteria, UA: NONE SEEN
Bilirubin Urine: NEGATIVE
Glucose, UA: NEGATIVE mg/dL
Ketones, ur: NEGATIVE mg/dL
Leukocytes,Ua: NEGATIVE
Nitrite: NEGATIVE
Protein, ur: NEGATIVE mg/dL
Specific Gravity, Urine: 1.015 (ref 1.005–1.030)
pH: 5 (ref 5.0–8.0)

## 2022-01-20 LAB — CBC
HCT: 42 % (ref 36.0–46.0)
Hemoglobin: 12.7 g/dL (ref 12.0–15.0)
MCH: 29.7 pg (ref 26.0–34.0)
MCHC: 30.2 g/dL (ref 30.0–36.0)
MCV: 98.1 fL (ref 80.0–100.0)
Platelets: 280 10*3/uL (ref 150–400)
RBC: 4.28 MIL/uL (ref 3.87–5.11)
RDW: 12.1 % (ref 11.5–15.5)
WBC: 9.4 10*3/uL (ref 4.0–10.5)
nRBC: 0 % (ref 0.0–0.2)

## 2022-01-20 LAB — I-STAT VENOUS BLOOD GAS, ED
Acid-Base Excess: 11 mmol/L — ABNORMAL HIGH (ref 0.0–2.0)
Bicarbonate: 42.6 mmol/L — ABNORMAL HIGH (ref 20.0–28.0)
Calcium, Ion: 1.28 mmol/L (ref 1.15–1.40)
HCT: 41 % (ref 36.0–46.0)
Hemoglobin: 13.9 g/dL (ref 12.0–15.0)
O2 Saturation: 93 %
Potassium: 4.5 mmol/L (ref 3.5–5.1)
Sodium: 141 mmol/L (ref 135–145)
TCO2: 45 mmol/L — ABNORMAL HIGH (ref 22–32)
pCO2, Ven: 96.8 mmHg (ref 44–60)
pH, Ven: 7.251 (ref 7.25–7.43)
pO2, Ven: 84 mmHg — ABNORMAL HIGH (ref 32–45)

## 2022-01-20 LAB — COMPREHENSIVE METABOLIC PANEL
ALT: 11 U/L (ref 0–44)
AST: 13 U/L — ABNORMAL LOW (ref 15–41)
Albumin: 3.3 g/dL — ABNORMAL LOW (ref 3.5–5.0)
Alkaline Phosphatase: 81 U/L (ref 38–126)
Anion gap: 9 (ref 5–15)
BUN: 11 mg/dL (ref 8–23)
CO2: 37 mmol/L — ABNORMAL HIGH (ref 22–32)
Calcium: 9.7 mg/dL (ref 8.9–10.3)
Chloride: 96 mmol/L — ABNORMAL LOW (ref 98–111)
Creatinine, Ser: 0.87 mg/dL (ref 0.44–1.00)
GFR, Estimated: >60 mL/min (ref >60)
Glucose, Bld: 118 mg/dL — ABNORMAL HIGH (ref 70–99)
Potassium: 5.1 mmol/L (ref 3.5–5.1)
Sodium: 142 mmol/L (ref 135–145)
Total Bilirubin: 0.4 mg/dL (ref 0.3–1.2)
Total Protein: 7.1 g/dL (ref 6.5–8.1)

## 2022-01-20 LAB — BRAIN NATRIURETIC PEPTIDE: B Natriuretic Peptide: 73.4 pg/mL (ref 0.0–100.0)

## 2022-01-20 MED ORDER — GABAPENTIN 100 MG PO CAPS
100.0000 mg | ORAL_CAPSULE | Freq: Once | ORAL | Status: AC
Start: 2022-01-20 — End: 2022-01-20
  Administered 2022-01-20: 100 mg via ORAL
  Filled 2022-01-20: qty 1

## 2022-01-20 MED ORDER — OXYCODONE-ACETAMINOPHEN 5-325 MG PO TABS
2.0000 | ORAL_TABLET | Freq: Once | ORAL | Status: AC
  Administered 2022-01-20: 2 via ORAL
  Filled 2022-01-20: qty 2

## 2022-01-20 MED ORDER — ONDANSETRON 4 MG PO TBDP
4.0000 mg | ORAL_TABLET | Freq: Once | ORAL | Status: AC
  Administered 2022-01-20: 4 mg via ORAL
  Filled 2022-01-20: qty 1

## 2022-01-20 MED ORDER — TRAZODONE HCL 50 MG PO TABS: 50.0000 mg | ORAL_TABLET | Freq: Every day | ORAL | Status: DC

## 2022-01-20 NOTE — ED Notes (Signed)
Patient sister [name] [phone]

## 2022-01-20 NOTE — ED Notes (Signed)
Abigail, PA-C notified of critical VBG results.

## 2022-01-20 NOTE — Telephone Encounter (Signed)
Pt provider aware of symptoms, states she needs to report to ED for further evaluation, att to contact pt to advise no ans phone rings then cuts off

## 2022-01-20 NOTE — ED Provider Notes (Signed)
[facility] EMERGENCY DEPARTMENT Provider Note   CSN: [id number] Arrival date & time: 01/20/22  1405     History {Add pertinent medical, surgical, social history, OB history to HPI:1} Chief Complaint  Patient presents with  . Fall    Janice Ryan is a 66 y.o. female who presents emergency department chief complaint of fall.  She has a past medical history of obesity hypoventilation syndrome chronic hypoxic respiratory failure, hypertension, moderate persistent asthma, and morbid obesity.  She is status post left total knee arthroplasty.  She complains of chronic pain in her right knee.  She normally ambulates with a rolling walker walker however earlier this morning around 6 AM she was going to use the bathroom, her right foot slipped forward due to loss of traction on the floor.  She fell backward onto her back.  Since that time she has had severe pain in her right hip and knee.  Pain is described as aching, throbbing, swelling, constant, worse with change in position or movement, improved with rest.  She has been unable to ambulate.  She follows with pain management for chronic knee pain and normally takes tramadol and gabapentin.  She has not had any today.  Did not hit her head or lose consciousness.  She denies any severe back pain, weakness, numbness, saddle anesthesia.  Fall       Home Medications Prior to Admission medications   Medication Sig Start Date End Date Taking? Authorizing Provider  albuterol (PROVENTIL) (2.5 MG/3ML) 0.083% nebulizer solution Take 3 mLs (2.5 mg total) by nebulization every 6 (six) hours as needed for wheezing or shortness of breath. 01/10/22   [name], NP  albuterol (VENTOLIN HFA) 108 (90 Base) MCG/ACT inhaler Inhale 2 puffs into the lungs every 6 (six) hours as needed for wheezing or shortness of breath. 01/10/22   [name], NP  azithromycin (ZITHROMAX) 250 MG tablet Take 2 tabs PO day 1, then take 1 tab PO  once daily 01/10/22   [name], NP  benzonatate (TESSALON PERLES) 100 MG capsule Take 2 capsules (200 mg total) by mouth 3 (three) times daily as needed for cough. 08/02/21   Hoy Register, MD  busPIRone (BUSPAR) 5 MG tablet TAKE ONE TABLET BY MOUTH THREE TIMES A DAY Patient not taking: Reported on 07/06/2021 01/25/20   Fulp, Cammie, MD  carbamide peroxide (DEBROX) 6.5 % OTIC solution Place 5 drops into both ears 2 (two) times daily. Do not use more than 4 days in a row. Patient not taking: Reported on 07/06/2021 07/23/20   [name], NP  cetirizine (ZYRTEC ALLERGY) 10 MG tablet Take 1 tablet (10 mg total) by mouth daily. 07/06/21   Mayers, Cari S, PA-C  Cholecalciferol (VITAMIN D3 PO) Take 1 tablet by mouth daily.    [provider]  diclofenac Sodium (VOLTAREN) 1 % GEL Apply 2 g topically 4 (four) times daily as needed. 04/02/19   Burnadette Pop, MD  DULoxetine (CYMBALTA) 60 MG capsule Take 1 capsule (60 mg total) by mouth daily. Patient taking differently: Take 120 mg by mouth daily. 06/26/19   Fulp, Cammie, MD  erythromycin ophthalmic ointment Place 1 application into the right eye at bedtime. Patient not taking: Reported on 07/06/2021 02/02/21   Ivonne Andrew, NP  Ferrous Sulfate (IRON PO) Take 1 tablet by mouth every other day. Patient not taking: Reported on 07/06/2021    [provider]  fluticasone-salmeterol (ADVAIR) 500-50 MCG/ACT AEPB Inhale 1 puff  into the lungs in the morning and at bedtime. 01/10/22   Camillia Herter, NP  furosemide (LASIX) 20 MG tablet Take 1 tablet (20 mg total) by mouth daily. Patient not taking: Reported on 08/08/2019 11/25/18   Charlott Rakes, MD  gabapentin (NEURONTIN) 100 MG capsule TAKE ONE CAPSULE BY MOUTH THREE TIMES A DAY Patient not taking: Reported on 07/06/2021 01/25/20   Fulp, Cammie, MD  lisinopril (ZESTRIL) 20 MG tablet Take 1 tablet (20 mg total) by mouth daily. 01/06/22   Camillia Herter, NP  meloxicam (MOBIC) 7.5 MG tablet Take  1 tablet (7.5 mg total) by mouth daily. As needed for pain; take after eating 09/26/19   Ladell Pier, MD  montelukast (SINGULAIR) 10 MG tablet TAKE ONE TABLET BY MOUTH EVERY NIGHT AT BEDTIME Patient not taking: Reported on 07/06/2021 08/15/19   Antony Blackbird, MD  Multiple Vitamin (MULTIVITAMIN WITH MINERALS) TABS tablet Take 1 tablet by mouth daily.    [provider]  nystatin (MYCOSTATIN/NYSTOP) powder Apply 1 application topically 3 (three) times daily. Patient not taking: Reported on 08/11/2019 08/08/19   Madilyn Hook A, PA-C  nystatin cream (MYCOSTATIN) Apply to affected area 2 times daily as needed for fungal rash Patient not taking: Reported on 07/06/2021 09/26/19   Ladell Pier, MD  OXYGEN Inhale into the lungs at bedtime as needed (2 L).    [provider]  traZODone (DESYREL) 50 MG tablet Take 1 tablet (50 mg total) by mouth at bedtime. 08/02/21   Charlott Rakes, MD      Allergies    Gabapentin and Sulfa antibiotics    Review of Systems   Review of Systems  Physical Exam Updated Vital Signs BP (!) 150/90   Pulse 88   Temp 97.9 F (36.6 C) (Oral)   Resp 20   Ht 5\' 7"  (1.702 m)   Wt (!) 244.9 kg   SpO2 96%   BMI 84.58 kg/m  Physical Exam Vitals and nursing note reviewed.  Constitutional:      General: She is not in acute distress.    Appearance: She is well-developed. She is morbidly obese. She is not diaphoretic.     Interventions: Nasal cannula in place.     Comments: Exam is limited due to body habitus.  HENT:     Head: Normocephalic and atraumatic.     Right Ear: External ear normal.     Left Ear: External ear normal.     Nose: Nose normal.     Mouth/Throat:     Mouth: Mucous membranes are moist.  Eyes:     General: No scleral icterus.    Conjunctiva/sclera: Conjunctivae normal.  Cardiovascular:     Rate and Rhythm: Normal rate and regular rhythm.     Heart sounds: Normal heart sounds. No murmur heard.    No friction rub. No gallop.   Pulmonary:     Effort: Pulmonary effort is normal. No respiratory distress.     Breath sounds: Normal breath sounds.  Abdominal:     General: Bowel sounds are normal. There is no distension.     Palpations: Abdomen is soft. There is no mass.     Tenderness: There is no abdominal tenderness. There is no guarding.  Musculoskeletal:     Cervical back: Normal range of motion.     Comments: Left knee status post total knee arthroplasty with well-healed midline surgical scar.  Right knee appears to be swollen.  Tender to palpation.  Difficult to identify  landmarks.  Pain with passive and active range of motion of the knee.  Normal ipsilateral right ankle examination.  2+ DP pulse.  Pain in the knee with internal and external rotation of the hip.  No pain with palpation of the lumbar midline spine, or with rotation of the lumbar spine.  Skin:    General: Skin is warm and dry.  Neurological:     Mental Status: She is alert and oriented to person, place, and time.  Psychiatric:        Behavior: Behavior normal.     ED Results / Procedures / Treatments   Labs (all labs ordered are listed, but only abnormal results are displayed) Labs Reviewed  COMPREHENSIVE METABOLIC PANEL - Abnormal; Notable for the following components:      Result Value   Chloride 96 (*)    CO2 37 (*)    Glucose, Bld 118 (*)    Albumin 3.3 (*)    AST 13 (*)    All other components within normal limits  URINALYSIS, ROUTINE W REFLEX MICROSCOPIC - Abnormal; Notable for the following components:   Hgb urine dipstick MODERATE (*)    All other components within normal limits  I-STAT VENOUS BLOOD GAS, ED - Abnormal; Notable for the following components:   pCO2, Ven 96.8 (*)    pO2, Ven 84 (*)    Bicarbonate 42.6 (*)    TCO2 45 (*)    Acid-Base Excess 11.0 (*)    All other components within normal limits  CBC  BRAIN NATRIURETIC PEPTIDE    EKG None  Radiology No results found.  Procedures Procedures  {Document  cardiac monitor, telemetry assessment procedure when appropriate:1}  Medications Ordered in ED Medications  gabapentin (NEURONTIN) capsule 100 mg (has no administration in time range)  oxyCODONE-acetaminophen (PERCOCET/ROXICET) 5-325 MG per tablet 2 tablet (has no administration in time range)  ondansetron (ZOFRAN-ODT) disintegrating tablet 4 mg (has no administration in time range)    ED Course/ Medical Decision Making/ A&P Clinical Course as of 01/20/22 2328  Fri Jan 20, 2022  1745 I visualized and interpreted right knee and hip x-ray, no acute findings. [AH]  1800 Patient Pain is much better, current plan for ace wrap and try to ambulate with walker; [AH]  1845 I updated nursing staff on plan , currently patient is a bit sleepy- likely due to pain meds will reevaluate. [AH]  2135 Patient more difficult to arouse, will obtain VBG- r/o hypercarbia [AH]  2228 I-Stat venous blood gas, [facility] ED only)(!!) Patient is extremely sleepy- VBG shows likely acute on chronic Hypercarbic resp failure- bipap ordered [AH]    Clinical Course User Index [AH] Arthor Captain, PA-C                           Medical Decision Making Amount and/or Complexity of Data Reviewed Labs:  Decision-making details documented in ED Course. Radiology: ordered.  Risk Prescription drug management.     {Document critical care time when appropriate:1} {Document review of labs and clinical decision tools ie heart score, Chads2Vasc2 etc:1}  {Document your independent review of radiology images, and any outside records:1} {Document your discussion with family members, caretakers, and with consultants:1} {Document social determinants of health affecting pt's care:1} {Document your decision making why or why not admission, treatments were needed:1} Final Clinical Impression(s) / ED Diagnoses Final diagnoses:  None    Rx / DC Orders ED Discharge Orders  None       

## 2022-01-20 NOTE — ED Triage Notes (Signed)
Pt here from home with c/o fall after her leg gave out , c/o right leg mostly but pain all over , pt is on 2 liters otc

## 2022-01-20 NOTE — ED Provider Triage Note (Signed)
Emergency Medicine Provider Triage Evaluation Note  Janice Ryan , a 67 y.o. female  was evaluated in triage.  Pt complains of fall.  Patient reports prior to arrival her right leg gave out when she was attempting to get out of bed.  Patient reports she fell onto her right hip and is noting right hip pain along with right upper thigh pain.  The patient denies head strike, loss of consciousness.  Patient also reporting right knee pain however states that this is chronic in nature and being evaluated by doctors at [facility].  Review of Systems  Positive:  Negative:   Physical Exam  There were no vitals taken for this visit. Gen:   Awake, no distress   Resp:  Normal effort  MSK:   Moves extremities without difficulty  Other:    Medical Decision Making  Medically screening exam initiated at 2:45 PM.  Appropriate orders placed.  Peter Daquila was informed that the remainder of the evaluation will be completed by another provider, this initial triage assessment does not replace that evaluation, and the importance of remaining in the ED until their evaluation is complete.    Azucena Cecil, PA-C 01/20/22 1446

## 2022-01-20 NOTE — ED Notes (Signed)
Pt placed on bipap  

## 2022-01-20 NOTE — Telephone Encounter (Signed)
Breathing jerks excruciating pain to hip to foot working with orthopedic at [facility]- deep in side- has fallen several times- advised pt to go to ED or call 911- hypoxia 84% wears O2. H/o sinus problems and bronchitis on Abx out of Amoxicillin-  Right leg redness and swelling and dry skin. Chest breast mid chest and back spasm Reason for Disposition  Oxygen level (e.g., pulse oximetry) 90 percent or lower  Answer Assessment - Initial Assessment Questions 1. RESPIRATORY STATUS: "Describe your breathing?" (e.g., wheezing, shortness of breath, unable to speak, severe coughing)      SOB difficulty breathing 2. ONSET: "When did this breathing problem begin?"      N/a 3. PATTERN "Does the difficult breathing come and go, or has it been constant since it started?"      constant 4. SEVERITY: "How bad is your breathing?" (e.g., mild, moderate, severe)    - MILD: No SOB at rest, mild SOB with walking, speaks normally in sentences, can lie down, no retractions, pulse < 100.    - MODERATE: SOB at rest, SOB with minimal exertion and prefers to sit, cannot lie down flat, speaks in phrases, mild retractions, audible wheezing, pulse 100-120.    - SEVERE: Very SOB at rest, speaks in single words, struggling to breathe, sitting hunched forward, retractions, pulse > 120      moderate 5. RECURRENT SYMPTOM: "Have you had difficulty breathing before?" If Yes, ask: "When was the last time?" and "What happened that time?"      Yes wears oxygen 6. CARDIAC HISTORY: "Do you have any history of heart disease?" (e.g., heart attack, angina, bypass surgery, angioplasty)      HTN 7. LUNG HISTORY: "Do you have any history of lung disease?"  (e.g., pulmonary embolus, asthma, emphysema)    Asthma, wears oxygen at night  8. CAUSE: "What do you think is causing the breathing problem?"      Being treated for sinus infection and bronchitis 9. OTHER SYMPTOMS: "Do you have any other symptoms? (e.g., dizziness, runny nose, cough, chest  pain, fever)     Mid sternal chest pain and back pain and severe pain to leg and hip, bilateral lower leg swelling and redness, worse to right leg 10. O2 SATURATION MONITOR:  "Do you use an oxygen saturation monitor (pulse oximeter) at home?" If Yes, ask: "What is your reading (oxygen level) today?" "What is your usual oxygen saturation reading?" (e.g., 95%)       84% 11. PREGNANCY: "Is there any chance you are pregnant?" "When was your last menstrual period?"       N/a 12. TRAVEL: "Have you traveled out of the country in the last month?" (e.g., travel history, exposures)       N/a  Protocols used: Breathing Difficulty-A-AH

## 2022-01-21 ENCOUNTER — Other Ambulatory Visit (HOSPITAL_COMMUNITY): Payer: BLUE CROSS/BLUE SHIELD

## 2022-01-21 ENCOUNTER — Encounter (HOSPITAL_COMMUNITY): Payer: Self-pay | Admitting: Family Medicine

## 2022-01-21 ENCOUNTER — Observation Stay (HOSPITAL_COMMUNITY): Payer: BLUE CROSS/BLUE SHIELD

## 2022-01-21 DIAGNOSIS — J9602 Acute respiratory failure with hypercapnia: Secondary | ICD-10-CM | POA: Diagnosis present

## 2022-01-21 DIAGNOSIS — J9621 Acute and chronic respiratory failure with hypoxia: Secondary | ICD-10-CM | POA: Diagnosis present

## 2022-01-21 DIAGNOSIS — J9601 Acute respiratory failure with hypoxia: Secondary | ICD-10-CM | POA: Diagnosis not present

## 2022-01-21 DIAGNOSIS — Z993 Dependence on wheelchair: Secondary | ICD-10-CM | POA: Diagnosis not present

## 2022-01-21 DIAGNOSIS — J9620 Acute and chronic respiratory failure, unspecified whether with hypoxia or hypercapnia: Secondary | ICD-10-CM | POA: Diagnosis not present

## 2022-01-21 DIAGNOSIS — Y92009 Unspecified place in unspecified non-institutional (private) residence as the place of occurrence of the external cause: Secondary | ICD-10-CM | POA: Diagnosis not present

## 2022-01-21 DIAGNOSIS — Z8249 Family history of ischemic heart disease and other diseases of the circulatory system: Secondary | ICD-10-CM | POA: Diagnosis not present

## 2022-01-21 DIAGNOSIS — N179 Acute kidney failure, unspecified: Secondary | ICD-10-CM | POA: Diagnosis present

## 2022-01-21 DIAGNOSIS — Z659 Problem related to unspecified psychosocial circumstances: Secondary | ICD-10-CM | POA: Diagnosis not present

## 2022-01-21 DIAGNOSIS — J4540 Moderate persistent asthma, uncomplicated: Secondary | ICD-10-CM | POA: Diagnosis present

## 2022-01-21 DIAGNOSIS — I110 Hypertensive heart disease with heart failure: Secondary | ICD-10-CM | POA: Diagnosis present

## 2022-01-21 DIAGNOSIS — Z20822 Contact with and (suspected) exposure to covid-19: Secondary | ICD-10-CM | POA: Diagnosis present

## 2022-01-21 DIAGNOSIS — E8729 Other acidosis: Secondary | ICD-10-CM | POA: Diagnosis present

## 2022-01-21 DIAGNOSIS — G9341 Metabolic encephalopathy: Secondary | ICD-10-CM | POA: Diagnosis present

## 2022-01-21 DIAGNOSIS — W19XXXA Unspecified fall, initial encounter: Secondary | ICD-10-CM | POA: Diagnosis not present

## 2022-01-21 DIAGNOSIS — Z9981 Dependence on supplemental oxygen: Secondary | ICD-10-CM | POA: Diagnosis not present

## 2022-01-21 DIAGNOSIS — E875 Hyperkalemia: Secondary | ICD-10-CM | POA: Diagnosis present

## 2022-01-21 DIAGNOSIS — E662 Morbid (severe) obesity with alveolar hypoventilation: Secondary | ICD-10-CM | POA: Diagnosis present

## 2022-01-21 DIAGNOSIS — M25561 Pain in right knee: Secondary | ICD-10-CM | POA: Diagnosis present

## 2022-01-21 DIAGNOSIS — J189 Pneumonia, unspecified organism: Secondary | ICD-10-CM | POA: Diagnosis present

## 2022-01-21 DIAGNOSIS — D7589 Other specified diseases of blood and blood-forming organs: Secondary | ICD-10-CM | POA: Diagnosis present

## 2022-01-21 DIAGNOSIS — W010XXA Fall on same level from slipping, tripping and stumbling without subsequent striking against object, initial encounter: Secondary | ICD-10-CM | POA: Diagnosis present

## 2022-01-21 DIAGNOSIS — Z7951 Long term (current) use of inhaled steroids: Secondary | ICD-10-CM | POA: Diagnosis not present

## 2022-01-21 DIAGNOSIS — M797 Fibromyalgia: Secondary | ICD-10-CM | POA: Diagnosis present

## 2022-01-21 DIAGNOSIS — Z6841 Body Mass Index (BMI) 40.0 and over, adult: Secondary | ICD-10-CM | POA: Diagnosis not present

## 2022-01-21 DIAGNOSIS — Z96652 Presence of left artificial knee joint: Secondary | ICD-10-CM | POA: Diagnosis present

## 2022-01-21 DIAGNOSIS — Z79899 Other long term (current) drug therapy: Secondary | ICD-10-CM | POA: Diagnosis not present

## 2022-01-21 DIAGNOSIS — J810 Acute pulmonary edema: Secondary | ICD-10-CM | POA: Diagnosis not present

## 2022-01-21 DIAGNOSIS — F330 Major depressive disorder, recurrent, mild: Secondary | ICD-10-CM | POA: Diagnosis present

## 2022-01-21 DIAGNOSIS — J9622 Acute and chronic respiratory failure with hypercapnia: Secondary | ICD-10-CM | POA: Diagnosis present

## 2022-01-21 DIAGNOSIS — I5032 Chronic diastolic (congestive) heart failure: Secondary | ICD-10-CM | POA: Diagnosis present

## 2022-01-21 LAB — BASIC METABOLIC PANEL
Anion gap: 10 (ref 5–15)
Anion gap: 10 (ref 5–15)
BUN: 12 mg/dL (ref 8–23)
BUN: 13 mg/dL (ref 8–23)
CO2: 34 mmol/L — ABNORMAL HIGH (ref 22–32)
CO2: 35 mmol/L — ABNORMAL HIGH (ref 22–32)
Calcium: 9.7 mg/dL (ref 8.9–10.3)
Calcium: 10.3 mg/dL (ref 8.9–10.3)
Chloride: 96 mmol/L — ABNORMAL LOW (ref 98–111)
Chloride: 98 mmol/L (ref 98–111)
Creatinine, Ser: 0.72 mg/dL (ref 0.44–1.00)
Creatinine, Ser: 0.82 mg/dL (ref 0.44–1.00)
GFR, Estimated: >60 mL/min (ref >60)
GFR, Estimated: >60 mL/min (ref >60)
Glucose, Bld: 115 mg/dL — ABNORMAL HIGH (ref 70–99)
Glucose, Bld: 104 mg/dL — ABNORMAL HIGH (ref 70–99)
Potassium: 5.5 mmol/L — ABNORMAL HIGH (ref 3.5–5.1)
Potassium: 5.5 mmol/L — ABNORMAL HIGH (ref 3.5–5.1)
Sodium: 140 mmol/L (ref 135–145)
Sodium: 143 mmol/L (ref 135–145)

## 2022-01-21 LAB — BLOOD GAS, ARTERIAL
Acid-Base Excess: 17 mmol/L — ABNORMAL HIGH (ref 0.0–2.0)
Acid-Base Excess: 20 mmol/L — ABNORMAL HIGH (ref 0.0–2.0)
Bicarbonate: 47.4 mmol/L — ABNORMAL HIGH (ref 20.0–28.0)
Bicarbonate: 50.8 mmol/L — ABNORMAL HIGH (ref 20.0–28.0)
Drawn by: 406621
Drawn by: 55062
O2 Saturation: 98.5 %
O2 Saturation: 98.7 %
Patient temperature: 37
Patient temperature: 37.2
pCO2 arterial: 92 mmHg (ref 32–48)
pCO2 arterial: 93 mmHg (ref 32–48)
pH, Arterial: 7.32 — ABNORMAL LOW (ref 7.35–7.45)
pH, Arterial: 7.35 (ref 7.35–7.45)
pO2, Arterial: 87 mmHg (ref 83–108)
pO2, Arterial: 111 mmHg — ABNORMAL HIGH (ref 83–108)

## 2022-01-21 LAB — CBC
HCT: 44.7 % (ref 36.0–46.0)
Hemoglobin: 13 g/dL (ref 12.0–15.0)
MCH: 29.7 pg (ref 26.0–34.0)
MCHC: 29.1 g/dL — ABNORMAL LOW (ref 30.0–36.0)
MCV: 102.1 fL — ABNORMAL HIGH (ref 80.0–100.0)
Platelets: 224 10*3/uL (ref 150–400)
RBC: 4.38 MIL/uL (ref 3.87–5.11)
RDW: 11.9 % (ref 11.5–15.5)
WBC: 10.6 10*3/uL — ABNORMAL HIGH (ref 4.0–10.5)
nRBC: 0 % (ref 0.0–0.2)

## 2022-01-21 LAB — I-STAT ARTERIAL BLOOD GAS, ED
Acid-Base Excess: 12 mmol/L — ABNORMAL HIGH (ref 0.0–2.0)
Bicarbonate: 44.5 mmol/L — ABNORMAL HIGH (ref 20.0–28.0)
Calcium, Ion: 1.35 mmol/L (ref 1.15–1.40)
HCT: 40 % (ref 36.0–46.0)
Hemoglobin: 13.6 g/dL (ref 12.0–15.0)
O2 Saturation: 95 %
Patient temperature: 98.3
Potassium: 4.7 mmol/L (ref 3.5–5.1)
Sodium: 141 mmol/L (ref 135–145)
TCO2: 48 mmol/L — ABNORMAL HIGH (ref 22–32)
pCO2 arterial: 104.4 mmHg (ref 32–48)
pH, Arterial: 7.237 — ABNORMAL LOW (ref 7.35–7.45)
pO2, Arterial: 99 mmHg (ref 83–108)

## 2022-01-21 LAB — HIV ANTIBODY (ROUTINE TESTING W REFLEX): HIV Screen 4th Generation wRfx: NONREACTIVE

## 2022-01-21 MED ORDER — SODIUM ZIRCONIUM CYCLOSILICATE 5 G PO PACK
5.0000 g | PACK | Freq: Once | ORAL | Status: AC
  Administered 2022-01-21: 5 g via ORAL
  Filled 2022-01-21: qty 1

## 2022-01-21 MED ORDER — ENOXAPARIN SODIUM 120 MG/0.8ML IJ SOSY
120.0000 mg | PREFILLED_SYRINGE | Freq: Every day | INTRAMUSCULAR | Status: DC
  Administered 2022-01-21 – 2022-01-26 (×6): 120 mg via SUBCUTANEOUS
  Filled 2022-01-21 (×7): qty 0.8

## 2022-01-21 MED ORDER — ACETAMINOPHEN 325 MG PO TABS
650.0000 mg | ORAL_TABLET | Freq: Once | ORAL | Status: AC
  Administered 2022-01-21: 650 mg via ORAL

## 2022-01-21 MED ORDER — FUROSEMIDE 10 MG/ML IJ SOLN: 40.0000 mg | Freq: Once | INTRAMUSCULAR | Status: DC

## 2022-01-21 MED ORDER — FUROSEMIDE 10 MG/ML IJ SOLN
40.0000 mg | Freq: Once | INTRAMUSCULAR | Status: AC
  Administered 2022-01-21: 40 mg via INTRAVENOUS
  Filled 2022-01-21: qty 4

## 2022-01-21 NOTE — Progress Notes (Signed)
FMTS Interim Progress Note  S: Messaged by nursing that patient is noncompliant with keeping her mask on and continues to remove BiPAP mask while in the room with desaturations into the 60s.  Says this happened about 4-5 times over the last hour.  Went to room to evaluate patient and she appears very anxious and continues to talk about her son.  She is able to tell me her name but is not able to tell me the situation/year.  O: BP 108/66 (BP Location: Right Wrist)   Pulse 87   Temp 98.2 F (36.8 C) (Axillary)   Resp (!) 32   Ht 5\' 7"  (1.702 m)   Wt (!) 244.9 kg   SpO2 (!) 66%   BMI 84.58 kg/m    Gen: oriented to person but not place, year, situation; anxious  A/P: Acute on chronic hypercarbic respiratory failure Does appear slightly more altered than previously.  Continues to take her BiPAP mask off multiple times.  I called and spoke with Velna Hatchet with PCCM to come evaluate patient for further recommendations as well.  Can consider heated high flow in the meantime. XR showed pulmonary edema. -Would benefit from sitter in room with frequent redirection -Lasix 40 mg x 1 -Echocardiogram -PCCM consulted, appreciate recommendations  Gerrit Heck, MD 01/21/2022, 2:40 PM PGY-2, [place] Medicine Service pager [phone]

## 2022-01-21 NOTE — Progress Notes (Signed)
Received pt from ED on bipap in stable condition. Increased Ipap and Epap based on latest ABG. RT will closely monitor patient.

## 2022-01-21 NOTE — Assessment & Plan Note (Addendum)
Stable, BP largely normotensive with isolated diastolic hypertension down to the 50s.  Lisinopril held due to AKI.  Plan to continue holding lisinopril, PCP to restart on outpatient basis.
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
## 2022-01-21 NOTE — Assessment & Plan Note (Signed)
5.5 this morning to 4.7 on recheck. -Monitor on AM BMP
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
## 2022-01-21 NOTE — ED Notes (Signed)
Dr. Oleh Genin responded via Epic. Made aware that pt is continuously removing bipap mask and bipap alarms and constantly going off. Pt made the statement that she needed water or she was going to get sick.

## 2022-01-21 NOTE — Assessment & Plan Note (Addendum)
Right knee pain from fall after slipping on carpet. Negative for fractures on XR. Has MRI scheduled outpatient for further evaluation of potential ACL tear. - Tylenol PRN for pain - PT/OT - MRI outpatient - restart home tramadol
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
## 2022-01-21 NOTE — ED Notes (Signed)
Requested Network engineer to page admit provider at this time

## 2022-01-21 NOTE — Progress Notes (Signed)
FMTS Brief Progress Note  S:Went to patient room as patient was wanting BiPap mask removed. Discussed with RT and patient's ABG was not improved even after BiPap, adjustments were made to the settings. Patient is reporting that she is wanting water because her mouth is dry. She was okay with just being able to have her mouth wet as we discussed the importance of remaining on the BiPap, patient was agreeable.   Will keep patient on BiPap with ABG at 30 minutes after adjustments to see if ABG improves. If improving, can consider trial off of the BiPap   Essam Lowdermilk, DO 01/21/2022, 6:06 AM PGY-3, [facility] Night Resident  Please page [phone] with questions.

## 2022-01-21 NOTE — Progress Notes (Signed)
PT Cancellation Note  Patient Details Name: Janice Ryan MRN: [id number] DOB: 01/05/1956   Cancelled Treatment:    Reason Eval/Treat Not Completed: Medical issues which prohibited therapy. Pt recently transitioning back onto BiPAP prior to PT arrival. PT will hold until pt's respiratory status is more stable.   [name] 01/21/2022, 4:21 PM

## 2022-01-21 NOTE — Progress Notes (Signed)
Pt placed back on bipap as she one again got very sleepy. Rapid Response RN at bedside and will add her to watch list as well. MD made aware.

## 2022-01-21 NOTE — Progress Notes (Signed)
CSW acknowledges consult. CSW following to complete assessment when appropriate.

## 2022-01-21 NOTE — ED Notes (Signed)
Dr. Oleh Genin at bedside at this time

## 2022-01-21 NOTE — ED Notes (Signed)
Received verbal report from Mykenzie C RN at this time 

## 2022-01-21 NOTE — Hospital Course (Addendum)
Janice Ryan is a 66 y.o.female with a history of OSA, suspected OHS on home oxygen, obesity, asthma who was admitted to the family medicine teaching Service at [facility] for acute respiratory failure. Her hospital course is detailed below:  Acute on chronic respiratory failure with hypoxia and hypercapnia Concern for pulmonary edema versus pneumonia given CXR findings, but reassuringly was afebrile without leukocytosis or productive cough.  Received empiric azithromycin and ceftriaxone.  Required BiPAP on admission but eventually was weaned to home 3 L O2 on [facility].  Continue to use BiPAP at night.    Was initially intended to go to SNF or inpatient rehab but was unable to find placement due to elevated BMI.  Was discharged home with home health, sent home with NIV Trilogy*** machine.  Continued on LABA + ICS, as needed SABA nebulizer.  AKI AKI noted after starting home lisinopril, also received a dose of Lasix on the same day.  Both of these were discontinued.  Blood pressure remained stable.  Recommend recheck BMP outpatient before starting meds.  Fall at home Right knee pain from fall after slipping on carpet.  Negative for fractures on XR.  Recommend outpatient MRI for evaluation of potential ACL tear.  Received Tylenol, home tramadol for pain.  Hypercalcemia Noted to have mildly elevated calcium.  Vitamin D low, iCal mildly elevated 6.0, PTH normal.  Asymptomatic, no concerning findings on EKG.  Recommend outpatient follow-up.  Hypertension Held lisinopril and Lasix as above.  Other chronic conditions were medically managed with home medications and formulary alternatives as necessary   PCP Follow-up Recommendations: Ensure adherence/tolerance with BiPAP (Trilogy), home oxygen Recommend recheck BMP before restarting lisinopril. PCP to restart lisinopril and monitor.  Recommend outpatient MRI to evaluate right knee s/p fall at home. Consider further evaluation of hypercalcemia with  PCP.

## 2022-01-21 NOTE — Progress Notes (Signed)
Took pt off bipap and placed on 25L/50% HHFNC at this time. VS WNL, pt in no distress. RT will continue to closely monitor pt for need to go back on bipap.

## 2022-01-21 NOTE — ED Notes (Signed)
Requested Network engineer to page admit provider

## 2022-01-21 NOTE — Assessment & Plan Note (Addendum)
On 3L HFNC, feel she can transition to [facility]. Still using BiPAP at night.  Improving overall, continuing to wean to home O2 prior to discharge.  Concern for pulmonary edema vs pneumonia (?aspiration) given CXR findings, but no fevers, leukocytosis or productive cough. -Continue empiric ceftriaxone (day 5/5) - Continue LABA + ICS, prn SABA neb - BiPap qhs plus naps - Wean O2 as able, goal is to have her on her home 2.5 L - Avoid sedating meds - Daily weights -Flonase for congestion

## 2022-01-21 NOTE — Consult Note (Signed)
NAME:  Janice Ryan, MRN:  [id number], DOB:  [birth year], LOS: 0 ADMISSION DATE:  01/20/2022, CONSULTATION DATE:  10/21 REFERRING MD:  Dr. Jinny Sanders / Dr. Owens Shark, FPTS CHIEF COMPLAINT:  AMS   History of Present Illness:  66 y/o F who presented to [facility] on 10/20 with reports of altered mental status.   At baseline, the patient lives with her son and husband.  She is wheelchair bound, non-smoker.   On 10/21, the patient was on BiPAP but pulling mask off her face desaturating to the 60's. She reportedly had previously been treated with percocet 10mg /650 for knee pain on 10/20.  There were concerns some of her confusion and sleepiness was related to the narcotics. The patient was reportedly anxious, asking for her son.  She was oriented to her name but not situation / year.  ECHO pending.  She was given lasix 40 mg x1.  Repeat ABG showed improvement in ph to 7.32 / pCO2 92 / pO2 87, bicarbonate 47 (from 7.23/104).   Labs 10/21 - Na 140, K 5.5, Cl 96, CO2 34, glucose 115, BUN 12, Cr 0.72, WBC 10.6, hgb 13, and platelets 224. CXR concerning for bilateral perihilar opacities concerning for pulmonary edema and small right pleural effusion.   PCCM consulted for acute on chronic hypercarbic respiratory failure.   Pertinent  Medical History  Morbid Obesity / BMI 84 OHS / OSA  Chronic Hypoxic Respiratory failure - on home oxygen, not on CPAP/BiPAP Asthma  HTN   Significant Hospital Events: Including procedures, antibiotic start and stop dates in addition to other pertinent events   10/20 Admit with AMS. Received 10mg  oxycodone + 650 tylenol for knee pain 10/21 PCCM consulted   Interim History / Subjective:  As above   Objective   Blood pressure (!) 142/74, pulse 84, temperature 98.2 F (36.8 C), temperature source Axillary, resp. rate 16, height 5\' 7"  (1.702 m), weight (!) 244.9 kg, SpO2 97 %.    Vent Mode: BIPAP;PCV FiO2 (%):  [50 %-100 %] 50 % Set Rate:  [20 bmp-24 bmp] 24 bmp PEEP:   [5 cmH20] 5 cmH20   Intake/Output Summary (Last 24 hours) at 01/21/2022 1628 Last data filed at 01/21/2022 0754 Gross per 24 hour  Intake --  Output 1350 ml  Net -1350 ml   Filed Weights   01/20/22 1440  Weight: (!) 244.9 kg    Examination: General: morbidly obese female lying in bed on BiPAP in NAD HENT: MM pink/dry, mask in place, anicteric Lungs: non-labored, distant breath sounds Cardiovascular: s1s2 RRR, no appreciable MRG Abdomen: non-distended, soft, bsx4 active  Extremities: warm/dry, difficult to discern edema due to body habitus  Neuro: Awakens to voice, speech clear, oriented to self, place when she opens her eyes and looks around the room.  Re-oriented to time. Easily re-directs. MAE / follows commands.     Resolved Hospital Problem list      Assessment & Plan:   Acute on Chronic Hypercarbic and Hypoxic Respiratory Failure  Decompensated Obesity Hypoventilation Syndrome  Baseline chemistry shows resting CO2 of 34 on BMP / CO2 on ABG ~75-80 at baseline.  She has previously been recommended for home Trelegy but does not have insurance. She was ordered a PSG in 2020 but never completed it.  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) -continue diuresis as renal function / BP permit  -recommend slow long term weight loss program  -treat OSA / OHS  -ECHO pending   Best Practice (right click and "Reselect all SmartList Selections" daily)  Per Primary   Labs    CBC: Recent Labs  Lab 01/20/22 1445 01/20/22 2157 01/21/22 0522 01/21/22 0702  WBC 9.4  --  10.6*  --   HGB 12.7 13.9 13.0 13.6  HCT 42.0 41.0 44.7 40.0  MCV 98.1  --  102.1*  --   PLT 280  --  224  --     Basic Metabolic Panel: Recent Labs  Lab 01/20/22 1445 01/20/22 2157 01/21/22 0522 01/21/22 0702  NA 142 141 140 141  K 5.1 4.5 5.5* 4.7  CL 96*  --  96*  --   CO2 37*  --  34*  --   GLUCOSE 118*  --  115*  --   BUN 11  --  12  --   CREATININE 0.87  --  0.72  --   CALCIUM 9.7  --  9.7  --    GFR: Estimated Creatinine Clearance: 151.3 mL/min (by C-G formula based on SCr of 0.72 mg/dL). Recent Labs  Lab 01/20/22 1445 01/21/22 0522  WBC 9.4 10.6*    Liver Function Tests: Recent Labs  Lab 01/20/22 1445  AST 13*  ALT 11  ALKPHOS 81  BILITOT 0.4  PROT 7.1  ALBUMIN 3.3*   No results for input(s): "LIPASE", "AMYLASE" in the last 168 hours. No results for input(s): "AMMONIA" in the last 168 hours.  ABG    Component Value Date/Time   PHART 7.32 (L) 01/21/2022 1247   PCO2ART 92 (HH) 01/21/2022 1247   PO2ART 87 01/21/2022 1247   HCO3 47.4 (H) 01/21/2022 1247   TCO2 48 (H) 01/21/2022 0702   O2SAT 98.5 01/21/2022 1247     Coagulation Profile: No results for input(s): "INR", "PROTIME" in the last 168 hours.  Cardiac Enzymes: No results for input(s): "CKTOTAL", "CKMB", "CKMBINDEX", "TROPONINI" in the last 168 hours.  HbA1C: No results found for: "HGBA1C"  CBG: No results for input(s): "GLUCAP" in the last 168 hours.  Review of Systems:   Unable to complete as patient is on BiPAP   Past Medical History:  She,  has a past medical history of Acute respiratory failure with hypoxia ([place]), Asthma, Hypertension, and Morbid obesity ([place]).   Surgical History:   Past Surgical History:  Procedure Laterality Date   HERNIA REPAIR     JOINT REPLACEMENT Left    knee     Social History:   reports that she has never smoked. She has never used smokeless  tobacco. She reports that she does not drink alcohol and does not use drugs.   Family History:  Her family history includes Atrial fibrillation in her mother; Congestive Heart Failure in her father.   Allergies Allergies  Allergen Reactions   Gabapentin Other (See Comments)    Has un-controlled movement of her body, excessive dribble of saliva at night   Sulfa Antibiotics Nausea And Vomiting and Other (See Comments)    Stomach pain     Home Medications  Prior to Admission medications  Medication Sig Start Date End Date Taking? Authorizing Provider  albuterol (PROVENTIL) (2.5 MG/3ML) 0.083% nebulizer solution Take 3 mLs (2.5 mg total) by nebulization every 6 (six) hours as needed for wheezing or shortness of breath. 01/10/22   Camillia Herter, NP  albuterol (VENTOLIN HFA) 108 (90 Base) MCG/ACT inhaler Inhale 2 puffs into the lungs every 6 (six) hours as needed for wheezing or shortness of breath. 01/10/22   Camillia Herter, NP  amoxicillin-clavulanate (AUGMENTIN) 875-125 MG tablet Take 1 tablet by mouth 2 (two) times daily. 5 day course.    [provider]  amphetamine-dextroamphetamine (ADDERALL XR) 20 MG 24 hr capsule Take 20 mg by mouth daily.    [provider]  azithromycin (ZITHROMAX) 250 MG tablet Take 2 tabs PO day 1, then take 1 tab PO once daily Patient taking differently: Take 250-500 mg by mouth See admin instructions. 500 mg on day 1, 250 mg on days 2-5 01/10/22   Camillia Herter, NP  benzonatate (TESSALON PERLES) 100 MG capsule Take 2 capsules (200 mg total) by mouth 3 (three) times daily as needed for cough. 08/02/21   Charlott Rakes, MD  busPIRone (BUSPAR) 5 MG tablet TAKE ONE TABLET BY MOUTH THREE TIMES A DAY Patient taking differently: Take 5 mg by mouth 3 (three) times daily. 01/25/20   Fulp, Cammie, MD  cetirizine (ZYRTEC ALLERGY) 10 MG tablet Take 1 tablet (10 mg total) by mouth daily. 07/06/21   Mayers, Cari S, PA-C  Cholecalciferol (VITAMIN D3 PO) Take  1 tablet by mouth daily.    [provider]  diclofenac Sodium (VOLTAREN) 1 % GEL Apply 2 g topically 4 (four) times daily as needed. 04/02/19   Shelly Coss, MD  DULoxetine (CYMBALTA) 60 MG capsule Take 1 capsule (60 mg total) by mouth daily. Patient taking differently: Take 120 mg by mouth daily. 06/26/19   Fulp, Cammie, MD  fluticasone-salmeterol (ADVAIR) 500-50 MCG/ACT AEPB Inhale 1 puff into the lungs in the morning and at bedtime. 01/10/22   Camillia Herter, NP  lisinopril (ZESTRIL) 20 MG tablet Take 1 tablet (20 mg total) by mouth daily. 01/06/22   Camillia Herter, NP  meloxicam (MOBIC) 7.5 MG tablet Take 1 tablet (7.5 mg total) by mouth daily. As needed for pain; take after eating 09/26/19   Ladell Pier, MD  mirtazapine (REMERON) 15 MG tablet Take 15 mg by mouth at bedtime.    [provider]  Multiple Vitamin (MULTIVITAMIN WITH MINERALS) TABS tablet Take 1 tablet by mouth daily.    [provider]  OXYGEN Inhale into the lungs at bedtime as needed (2 L).    [provider]  traZODone (DESYREL) 50 MG tablet Take 1 tablet (50 mg total) by mouth at bedtime. 08/02/21   Charlott Rakes, MD     Critical care time: n/a     Noe Gens, MSN, APRN, NP-C, AGACNP-BC [facility] 01/21/2022, 4:28 PM   Please see [email or web address] for pager details.   From 7A-7P if no response, please call [phone] After hours, please call ELink [phone]

## 2022-01-21 NOTE — Progress Notes (Signed)
   01/21/22 0800  Assess: MEWS Score  Level of Consciousness Responds to Pain  SpO2 95 %  FiO2 (%) (S)  60 %  Assess: MEWS Score  MEWS Temp 0  MEWS Systolic 0  MEWS Pulse 0  MEWS RR 0  MEWS LOC 2  MEWS Score 2  MEWS Score Color Yellow  Assess: if the MEWS score is Yellow or Red  Were vital signs taken at a resting state? Yes  Focused Assessment No change from prior assessment  Does the patient meet 2 or more of the SIRS criteria? No  MEWS guidelines implemented *See Row Information* No, vital signs rechecked  Treat  MEWS Interventions Consulted Respiratory Therapy;Escalated (See documentation below)  Pain Scale 0-10  Pain Score Asleep  Neuro symptoms relieved by Rest  Take Vital Signs  Increase Vital Sign Frequency  Yellow: Q 2hr X 2 then Q 4hr X 2, if remains yellow, continue Q 4hrs  Escalate  MEWS: Escalate Yellow: discuss with charge nurse/RN and consider discussing with provider and RRT  Notify: Charge Nurse/RN  Name of Charge Nurse/RN Notified Erasmo Downer RN  Date Charge Nurse/RN Notified 01/21/22  Time Charge Nurse/RN Notified 0800  Notify: Provider  Provider Name/Title Dr Jinny Sanders  Date Provider Notified 01/21/22  Time Provider Notified 0900  Method of Notification Page  Notification Reason Change in status  Provider response En route;See new orders  Date of Provider Response 01/21/22  Time of Provider Response 0900

## 2022-01-21 NOTE — Progress Notes (Signed)
Po Lokelma administered, RT by bedside and changed her to warm HFNC 20l o2 prior to medication administration. Patient sating 93-96%, Rt coming back to switch her to BI-pap. Patient tolerating well. Dr. Marcina Millard aware.Plan of care continues.
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
## 2022-01-22 ENCOUNTER — Inpatient Hospital Stay (HOSPITAL_COMMUNITY): Payer: BLUE CROSS/BLUE SHIELD

## 2022-01-22 DIAGNOSIS — J9601 Acute respiratory failure with hypoxia: Secondary | ICD-10-CM | POA: Diagnosis not present

## 2022-01-22 DIAGNOSIS — W19XXXA Unspecified fall, initial encounter: Secondary | ICD-10-CM | POA: Diagnosis not present

## 2022-01-22 DIAGNOSIS — J810 Acute pulmonary edema: Secondary | ICD-10-CM

## 2022-01-22 DIAGNOSIS — J9620 Acute and chronic respiratory failure, unspecified whether with hypoxia or hypercapnia: Secondary | ICD-10-CM

## 2022-01-22 DIAGNOSIS — J9621 Acute and chronic respiratory failure with hypoxia: Secondary | ICD-10-CM

## 2022-01-22 DIAGNOSIS — Z659 Problem related to unspecified psychosocial circumstances: Secondary | ICD-10-CM | POA: Diagnosis not present

## 2022-01-22 LAB — CBC
HCT: 38.7 % (ref 36.0–46.0)
Hemoglobin: 11.4 g/dL — ABNORMAL LOW (ref 12.0–15.0)
MCH: 29 pg (ref 26.0–34.0)
MCHC: 29.5 g/dL — ABNORMAL LOW (ref 30.0–36.0)
MCV: 98.5 fL (ref 80.0–100.0)
Platelets: 241 10*3/uL (ref 150–400)
RBC: 3.93 MIL/uL (ref 3.87–5.11)
RDW: 11.9 % (ref 11.5–15.5)
WBC: 8.4 10*3/uL (ref 4.0–10.5)
nRBC: 0 % (ref 0.0–0.2)

## 2022-01-22 LAB — BLOOD GAS, ARTERIAL
Acid-Base Excess: 20.2 mmol/L — ABNORMAL HIGH (ref 0.0–2.0)
Bicarbonate: 50.3 mmol/L — ABNORMAL HIGH (ref 20.0–28.0)
Drawn by: 406621
O2 Saturation: 96.4 %
Patient temperature: 37.1
pCO2 arterial: 85 mmHg (ref 32–48)
pH, Arterial: 7.38 (ref 7.35–7.45)
pO2, Arterial: 73 mmHg — ABNORMAL LOW (ref 83–108)

## 2022-01-22 LAB — BASIC METABOLIC PANEL
Anion gap: 10 (ref 5–15)
BUN: 13 mg/dL (ref 8–23)
CO2: 39 mmol/L — ABNORMAL HIGH (ref 22–32)
Calcium: 10.1 mg/dL (ref 8.9–10.3)
Chloride: 94 mmol/L — ABNORMAL LOW (ref 98–111)
Creatinine, Ser: 0.85 mg/dL (ref 0.44–1.00)
GFR, Estimated: >60 mL/min (ref >60)
Glucose, Bld: 99 mg/dL (ref 70–99)
Potassium: 4 mmol/L (ref 3.5–5.1)
Sodium: 143 mmol/L (ref 135–145)

## 2022-01-22 LAB — ECHOCARDIOGRAM COMPLETE
AR max vel: 2.24 cm2
AV Area VTI: 2.63 cm2
AV Area mean vel: 2.33 cm2
AV Mean grad: 11 mmHg
AV Peak grad: 20.6 mmHg
Ao pk vel: 2.27 m/s
Area-P 1/2: 2.56 cm2
Height: 67 in
S' Lateral: 2.5 cm
Weight: 8640 oz

## 2022-01-22 LAB — GLUCOSE, CAPILLARY
Glucose-Capillary: 99 mg/dL (ref 70–99)
Glucose-Capillary: 100 mg/dL — ABNORMAL HIGH (ref 70–99)

## 2022-01-22 LAB — RESP PANEL BY RT-PCR (FLU A&B, COVID) ARPGX2
Influenza A by PCR: NEGATIVE
Influenza B by PCR: NEGATIVE
SARS Coronavirus 2 by RT PCR: NEGATIVE

## 2022-01-22 MED ORDER — DICLOFENAC SODIUM 1 % EX GEL
2.0000 g | Freq: Four times a day (QID) | CUTANEOUS | Status: DC | PRN
  Administered 2022-01-24 – 2022-01-25 (×4): 2 g via TOPICAL
  Filled 2022-01-22: qty 100

## 2022-01-22 MED ORDER — BUDESONIDE 0.25 MG/2ML IN SUSP
0.2500 mg | Freq: Two times a day (BID) | RESPIRATORY_TRACT | Status: DC
  Administered 2022-01-22 – 2022-01-29 (×14): 0.25 mg via RESPIRATORY_TRACT
  Filled 2022-01-22 (×14): qty 2

## 2022-01-22 MED ORDER — ACETAMINOPHEN 325 MG PO TABS
650.0000 mg | ORAL_TABLET | Freq: Four times a day (QID) | ORAL | Status: DC | PRN
  Administered 2022-01-22 – 2022-01-28 (×15): 650 mg via ORAL
  Filled 2022-01-22 (×15): qty 2

## 2022-01-22 MED ORDER — DULOXETINE HCL 60 MG PO CPEP
60.0000 mg | ORAL_CAPSULE | Freq: Every day | ORAL | Status: DC
  Administered 2022-01-22: 60 mg via ORAL
  Filled 2022-01-22: qty 1

## 2022-01-22 MED ORDER — ALBUTEROL SULFATE (2.5 MG/3ML) 0.083% IN NEBU: 2.5000 mg | INHALATION_SOLUTION | RESPIRATORY_TRACT | Status: DC | PRN

## 2022-01-22 MED ORDER — ADULT MULTIVITAMIN W/MINERALS CH
1.0000 | ORAL_TABLET | Freq: Every day | ORAL | Status: DC
  Administered 2022-01-22 – 2022-01-29 (×8): 1 via ORAL
  Filled 2022-01-22 (×8): qty 1

## 2022-01-22 MED ORDER — BUSPIRONE HCL 5 MG PO TABS
5.0000 mg | ORAL_TABLET | Freq: Three times a day (TID) | ORAL | Status: DC
  Administered 2022-01-22 – 2022-01-29 (×21): 5 mg via ORAL
  Filled 2022-01-22 (×21): qty 1

## 2022-01-22 MED ORDER — FUROSEMIDE 10 MG/ML IJ SOLN
40.0000 mg | Freq: Once | INTRAMUSCULAR | Status: AC
  Administered 2022-01-22: 40 mg via INTRAVENOUS
  Filled 2022-01-22: qty 4

## 2022-01-22 MED ORDER — ARFORMOTEROL TARTRATE 15 MCG/2ML IN NEBU
15.0000 ug | INHALATION_SOLUTION | Freq: Two times a day (BID) | RESPIRATORY_TRACT | Status: DC
  Administered 2022-01-22 – 2022-01-29 (×14): 15 ug via RESPIRATORY_TRACT
  Filled 2022-01-22 (×14): qty 2

## 2022-01-22 MED ORDER — PERFLUTREN LIPID MICROSPHERE
1.0000 mL | INTRAVENOUS | Status: AC | PRN
  Administered 2022-01-22: 4 mL via INTRAVENOUS

## 2022-01-22 NOTE — Progress Notes (Signed)
PCO2 91. Dr. Alfonso Patten and Dr. Marcina Millard made aware. Continue Bi-pap for now per MD. Plan of care continues.

## 2022-01-22 NOTE — Evaluation (Signed)
Physical Therapy Evaluation Patient Details Name: Janice Ryan MRN: [id number] DOB: [birth year] Today's Date: 01/22/2022  History of Present Illness  Patient is a 66 y/o female who presents on 10/20 with fall at home. Admitted with acute on chronic hypercarbic respiratory failure in the setting of narcotic administration secondary to knee pain. Required BiPAP and not HHFNC. PMH includes morbid obesity, asthma, anxiety, chronic right knee pain (reports torn ACL inr ight knee which is recent and needs surgery).  Clinical Impression  Patient presents with generalized weakness, decreased ROM BLEs, pain, impaired cognition, decreased activity tolerance, dyspnea at rest, worsened with exertion and impaired mobility s/p above. Pt lives at home with her spouse and son and reports needing assist for ADLs and transfers to her w/c at baseline. Reports recent fall and right ACL tear needing surgery. Today, pt's cognition waxes and wanes, very tangential, fluctuating between wanting to call the cops and crying tears of joy due to [place] helping her reposition. Easily distracted by condition and pain. Requires assist of 3 for rolling in bed to change saturated pads. SP02 dropped to 84% at the lowest during bed mobility on 20L HHFNC.  Recommend maxi move OOB for nursing. Would benefit from SNF to maximize independence and mobility as well as ease burden of care prior to return home. Will follow acutely.     Recommendations for follow up therapy are one component of a multi-disciplinary discharge planning process, led by the attending physician.  Recommendations may be updated based on patient status, additional functional criteria and insurance authorization.  Follow Up Recommendations Skilled nursing-short term rehab (<3 hours/day) Can patient physically be transported by private vehicle: No    Assistance Recommended at Discharge Frequent or constant Supervision/Assistance  Patient can return home with the  following  Two people to help with walking and/or transfers;Two people to help with bathing/dressing/bathroom;Assist for transportation;Direct supervision/assist for medications management;Assistance with cooking/housework;Direct supervision/assist for financial management    Equipment Recommendations None recommended by PT  Recommendations for Other Services       Functional Status Assessment       Precautions / Restrictions Precautions Precautions: Fall;Other (comment) Precaution Comments: watch 02, HHFNC Restrictions Weight Bearing Restrictions: No      Mobility  Bed Mobility Overal bed mobility: Needs Assistance Bed Mobility: Rolling Rolling: Total assist, +2 for physical assistance (+3 for rolling)         General bed mobility comments: Assist of 3 for rolling in both directions using pad and rail for pericare and changing pads due to them being saturated with urine. Cues for technique and to reach for rail. Assist needed to breathe esp when flat instead of talking. Sp02 dropped to the lowest of 84% on 20L HHFNC 60% but recovers with breathing, rest and elevating HOB.    Transfers                   General transfer comment: Defered    Ambulation/Gait                  Stairs            Wheelchair Mobility    Modified Rankin (Stroke Patients Only)       Balance                                             Pertinent Vitals/Pain Pain Assessment  Pain Assessment: Faces Faces Pain Scale: Hurts whole lot Pain Location: back, right knee Pain Descriptors / Indicators: Sore, Discomfort, Grimacing, Guarding, Moaning, Constant Pain Intervention(s): Monitored during session, Limited activity within patient's tolerance, Repositioned    Home Living Family/patient expects to be discharged to:: Private residence Living Arrangements: Spouse/significant other;Children Available Help at Discharge: Family;Available 24 hours/day Type  of Home: Apartment Home Access: Level entry         Home Equipment: Rolling Walker (2 wheels);Wheelchair - manual;Shower seat      Prior Function Prior Level of Function : Needs assist       Physical Assist : ADLs (physical);Mobility (physical) Mobility (physical): Bed mobility;Transfers   Mobility Comments: Transfers to w/c doing SPT with RW with assist from son. Reports recent fall. Non ambulatory, hx of falls. ADLs Comments: Assist needed for ADLs. Spouse bathes her in bed or in shower??? "sometimes i lay in the shower" per notes, assist needed for wiping.     Hand Dominance   Dominant Hand: Right    Extremity/Trunk Assessment   Upper Extremity Assessment Upper Extremity Assessment: Defer to OT evaluation    Lower Extremity Assessment Lower Extremity Assessment: Generalized weakness;RLE deficits/detail;LLE deficits/detail RLE Deficits / Details: Limited AROM at hip/knee due to stiffness/pain, difficuly with SLR and with knee flexion from recent fall RLE: Unable to fully assess due to pain RLE Sensation: decreased light touch LLE Deficits / Details: Limited AROM at hip/knee due to stiffness/pain, able to perform SLR LLE Sensation: decreased light touch    Cervical / Trunk Assessment Cervical / Trunk Assessment: Normal  Communication   Communication: No difficulties  Cognition Arousal/Alertness: Awake/alert Behavior During Therapy: WFL for tasks assessed/performed Overall Cognitive Status: Impaired/Different from baseline                                 General Comments: Tangential with speech and story telling, needs to be redirected back to current topic. Saying some nonsensical things as well. Perseverating about her right knee and how she got her ACL torn when she fell and needed surgery. Yelling from room all her ailments and how she needs to call the police. Thinks her nurse is an orthopedic surgery. Cognition seems to wax and wane. unsure of  baseline.        General Comments General comments (skin integrity, edema, etc.): Sp02 dropped to 84% at the lowest during bed mobility on 20L HHFNC.    Exercises     Assessment/Plan    PT Assessment Patient needs continued PT services  PT Problem List Decreased strength;Decreased mobility;Decreased range of motion;Obesity;Pain;Impaired sensation;Decreased activity tolerance;Decreased cognition;Cardiopulmonary status limiting activity       PT Treatment Interventions Therapeutic activities;Cognitive remediation;Patient/family education;Therapeutic exercise;Wheelchair mobility training;Functional mobility training    PT Goals (Current goals can be found in the Care Plan section)  Acute Rehab PT Goals Patient Stated Goal: to go home and get her son here PT Goal Formulation: With patient Time For Goal Achievement: 02/05/22 Potential to Achieve Goals: Fair    Frequency Min 3X/week     Co-evaluation PT/OT/SLP Co-Evaluation/Treatment: Yes Reason for Co-Treatment: Complexity of the patient's impairments (multi-system involvement);Necessary to address cognition/behavior during functional activity;To address functional/ADL transfers;For patient/therapist safety PT goals addressed during session: Strengthening/ROM;Mobility/safety with mobility         AM-PAC PT "6 Clicks" Mobility  Outcome Measure Help needed turning from your back to your side while in a flat bed without  using bedrails?: Total Help needed moving from lying on your back to sitting on the side of a flat bed without using bedrails?: Total Help needed moving to and from a bed to a chair (including a wheelchair)?: Total Help needed standing up from a chair using your arms (e.g., wheelchair or bedside chair)?: Total Help needed to walk in hospital room?: Total Help needed climbing 3-5 steps with a railing? : Total 6 Click Score: 6    End of Session Equipment Utilized During Treatment: Oxygen Activity Tolerance:  Patient limited by pain Patient left: in bed;with call bell/phone within reach;with bed alarm set;with nursing/sitter in room Nurse Communication: Mobility status;Need for lift equipment PT Visit Diagnosis: Pain Pain - Right/Left: Right Pain - part of body: Knee (back)    Time: [id number] PT Time Calculation (min) (ACUTE ONLY): 46 min   Charges:   PT Evaluation $PT Eval Moderate Complexity: 1 Mod PT Treatments $Therapeutic Activity: 8-22 mins        Marisa Severin, PT, DPT Acute Rehabilitation Services Secure chat preferred Office [phone]     Marguarite Arbour A Hennessey 01/22/2022, 11:10 AM

## 2022-01-22 NOTE — [place] FL2 (Signed)
[place] MEDICAID FL2 LEVEL OF CARE SCREENING TOOL     IDENTIFICATION  Patient Name: Janice Ryan Birthdate: May 15, 1955 Sex: female Admission Date (Current Location): 01/20/2022  [facility] and [place] Number:  Producer, television/film/video and Address:  The . [facility], [address] [place], [place] [address]      Provider Number:    Attending Physician Name and Address:  Westley Chandler, MD  Relative Name and Phone Number:  Zella Richer [phone]    Current Level of Care: Hospital Recommended Level of Care: Skilled Nursing Facility Prior Approval Number:    Date Approved/Denied:   PASRR Number: pending  Discharge Plan: SNF    Current Diagnoses: Patient Active Problem List   Diagnosis Date Noted   Acute on chronic respiratory failure with hypoxia and hypercapnia (HCC) 01/21/2022   Fall at home 01/21/2022   Poor social situation 01/21/2022   Acute pain of right knee    Acute respiratory failure with hypoxia and hypercarbia (HCC)    Acute sinusitis 06/23/2021   Major depressive disorder, recurrent episode, 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   Cellulitis 03/21/2019   Cellulitis of both lower extremities 03/20/2019   Chronic respiratory failure with hypoxia (HCC) 12/10/2018   HTN (hypertension) 12/10/2018   Anxiety state 12/10/2018   Chronic pain of right knee    Moderate persistent asthma 07/28/2018    Orientation RESPIRATION BLADDER Height & Weight     Self, Time, Situation, Place  Normal Continent Weight: (!) 540 lb (244.9 kg) Height:  5\' 7"  (170.2 cm)  BEHAVIORAL SYMPTOMS/MOOD NEUROLOGICAL BOWEL NUTRITION STATUS      Continent Diet  AMBULATORY STATUS COMMUNICATION OF NEEDS Skin     Verbally Normal                       Personal Care Assistance Level of Assistance              Functional Limitations Info  Sight, Hearing, Speech Sight Info: Adequate Hearing Info: Adequate Speech Info: Adequate    SPECIAL CARE FACTORS  FREQUENCY  PT (By licensed PT), OT (By licensed OT)     PT Frequency: 5x per week OT Frequency: 5x per week            Contractures Contractures Info: Not present    Additional Factors Info  Code Status, Allergies Code Status Info: full Allergies Info: neurontin, sulfa antibiotics           Current Medications (01/22/2022):  This is the current hospital active medication list Current Facility-Administered Medications  Medication Dose Route Frequency Provider Last Rate Last Admin   acetaminophen (TYLENOL) tablet 650 mg  650 mg Oral Q6H PRN 01/24/2022, MD   650 mg at 01/22/22 0603   albuterol (PROVENTIL) (2.5 MG/3ML) 0.083% nebulizer solution 2.5 mg  2.5 mg Nebulization Q4H PRN 01/24/22, MD       arformoterol [facility]) nebulizer solution 15 mcg  15 mcg Nebulization BID [facility], MD   15 mcg at 01/22/22 1315   budesonide (PULMICORT) nebulizer solution 0.25 mg  0.25 mg Nebulization BID 01/24/22, MD   0.25 mg at 01/22/22 1314   busPIRone (BUSPAR) tablet 5 mg  5 mg Oral TID 01/24/22, MD       diclofenac Sodium (VOLTAREN) 1 % topical gel 2 g  2 g Topical QID PRN Littie Deeds, MD       DULoxetine (CYMBALTA) DR capsule 60 mg  60  mg Oral Daily Martyn Malay, MD       enoxaparin (LOVENOX) injection 120 mg  120 mg Subcutaneous Daily Lilland, Alana, DO   120 mg at 01/22/22 1018   furosemide (LASIX) injection 40 mg  40 mg Intravenous Once Martyn Malay, MD       multivitamin with minerals tablet 1 tablet  1 tablet Oral Daily Zola Button, MD       perflutren lipid microspheres (DEFINITY) IV suspension  1-10 mL Intravenous PRN Jacelyn Grip, MD   4 mL at 01/22/22 1251     Discharge Medications: Please see discharge summary for a list of discharge medications.  Relevant Imaging Results:  Relevant Lab Results:   Additional Information [phone]  Elliot Gurney [place], [place]

## 2022-01-22 NOTE — NC FL2 (Signed)
[place] MEDICAID FL2 LEVEL OF CARE SCREENING TOOL     IDENTIFICATION  Patient Name: Janice Ryan Birthdate: May 05, 1955 Sex: female Admission Date (Current Location): 01/20/2022  [facility] and [place] Number:  Producer, television/film/video and Address:  The [place], [address], [place], [place] [address]      Provider Number:    Attending Physician Name and Address:  Westley Chandler, MD  Relative Name and Phone Number:  Zella Richer [phone]    Current Level of Care: Hospital Recommended Level of Care: Skilled Nursing Facility Prior Approval Number:    Date Approved/Denied:   PASRR Number: pending  Discharge Plan: SNF    Current Diagnoses: Patient Active Problem List   Diagnosis Date Noted   Acute on chronic respiratory failure with hypoxia and hypercapnia (HCC) 01/21/2022   Fall at home 01/21/2022   Poor social situation 01/21/2022   Acute pain of right knee    Acute respiratory failure with hypoxia and hypercarbia (HCC)    Acute sinusitis 06/23/2021   Major depressive disorder, recurrent episode, 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   Cellulitis 03/21/2019   Cellulitis of both lower extremities 03/20/2019   Chronic respiratory failure with hypoxia (HCC) 12/10/2018   HTN (hypertension) 12/10/2018   Anxiety state 12/10/2018   Chronic pain of right knee    Moderate persistent asthma 07/28/2018    Orientation RESPIRATION BLADDER Height & Weight     Self, Time, Situation, Place  Normal Continent Weight: (!) 540 lb (244.9 kg) Height:  5\' 7"  (170.2 cm)  BEHAVIORAL SYMPTOMS/MOOD NEUROLOGICAL BOWEL NUTRITION STATUS      Continent Diet  AMBULATORY STATUS COMMUNICATION OF NEEDS Skin     Verbally Normal                       Personal Care Assistance Level of Assistance              Functional Limitations Info  Sight, Hearing, Speech Sight Info: Adequate Hearing Info: Adequate Speech Info: Adequate    SPECIAL CARE FACTORS  FREQUENCY  PT (By licensed PT), OT (By licensed OT)     PT Frequency: 5x per week OT Frequency: 5x per week            Contractures Contractures Info: Not present    Additional Factors Info  Code Status, Allergies Code Status Info: full Allergies Info: neurontin, sulfa antibiotics           Current Medications (01/22/2022):  This is the current hospital active medication list Current Facility-Administered Medications  Medication Dose Route Frequency Provider Last Rate Last Admin   acetaminophen (TYLENOL) tablet 650 mg  650 mg Oral Q6H PRN 01/24/2022, MD   650 mg at 01/22/22 0603   albuterol (PROVENTIL) (2.5 MG/3ML) 0.083% nebulizer solution 2.5 mg  2.5 mg Nebulization Q4H PRN 01/24/22, MD       arformoterol [facility]) nebulizer solution 15 mcg  15 mcg Nebulization BID [facility], MD   15 mcg at 01/22/22 1315   budesonide (PULMICORT) nebulizer solution 0.25 mg  0.25 mg Nebulization BID 01/24/22, MD   0.25 mg at 01/22/22 1314   busPIRone (BUSPAR) tablet 5 mg  5 mg Oral TID 01/24/22, MD       diclofenac Sodium (VOLTAREN) 1 % topical gel 2 g  2 g Topical QID PRN Littie Deeds, MD       DULoxetine (CYMBALTA) DR capsule 60 mg  60  mg Oral Daily Martyn Malay, MD       enoxaparin (LOVENOX) injection 120 mg  120 mg Subcutaneous Daily Lilland, Alana, DO   120 mg at 01/22/22 1018   furosemide (LASIX) injection 40 mg  40 mg Intravenous Once Martyn Malay, MD       multivitamin with minerals tablet 1 tablet  1 tablet Oral Daily Zola Button, MD       perflutren lipid microspheres (DEFINITY) IV suspension  1-10 mL Intravenous PRN Jacelyn Grip, MD   4 mL at 01/22/22 1251     Discharge Medications: Please see discharge summary for a list of discharge medications.  Relevant Imaging Results:  Relevant Lab Results:   Additional Information [phone]  Elliot Gurney [place], [place]

## 2022-01-22 NOTE — Progress Notes (Signed)
FMTS Brief Progress Note S:Patient evaluated at bedside, resting comfortably. No acute complaints, endorses some mild knee pain, otherwise not concerning.   O: BP (!) 176/85 (BP Location: Right Arm)   Pulse (!) 103   Temp 98.2 F (36.8 C) (Axillary)   Resp (!) 22   Ht 5\' 7"  (1.702 m)   Wt (!) 244.9 kg   SpO2 95%   BMI 84.58 kg/m   General: NAD Cardiovascular: RRR, no murmurs, exam somewhat limited by body habitus  A/P: Acute on chronic respiratory failure with hypoxia and hypercapnia (HCC) Resting comfortably at bedside with BiPAP -Ensure patient continues BIPAP tonight -Monitor for any increased oxygen requirements or changes in mentation.    Christene Slates, MD 01/22/2022, 9:06 PM PGY-1, [facility] Family Medicine Night Resident  Please page [phone] with questions.

## 2022-01-22 NOTE — Evaluation (Signed)
Occupational Therapy Evaluation Patient Details Name: Janice Ryan MRN: [id number] DOB: Jul 11, 1955 Today's Date: 01/22/2022   History of Present Illness Patient is a 66 y/o female who presents on 10/20 with fall at home. Admitted with acute on chronic hypercarbic respiratory failure in the setting of narcotic administration secondary to knee pain. Required BiPAP and not HHFNC. PMH includes morbid obesity, asthma, anxiety, chronic right knee pain (reports torn ACL inr ight knee which is recent and needs surgery).   Clinical Impression   Patient admitted for above and presents with problem list below, including impaired cognition, decreased activity tolerance, generalized weakness, obesity, and decreased cardiopulmonary status.  She reports living with her son and spouse, having assist for ADLs and transfers into wheelchair but poor historian due to impaired cognition. She is oriented, follows simple commands with increased time but presents with poor awareness, problem solving and recall- very tangential and requires frequent redirection. She requires total assist +2 to roll in bed, min to total assist +2 for ADLs.  SpO2 with cueing for PLB maintained through majority of session, but with HOB flat desaturated to mid 80s. Based on performance today, believe pt will best benefit from further OT services acutely and after dc at SNF level to decrease burden of care prior to return home. Will follow.      Recommendations for follow up therapy are one component of a multi-disciplinary discharge planning process, led by the attending physician.  Recommendations may be updated based on patient status, additional functional criteria and insurance authorization.   Follow Up Recommendations  Skilled nursing-short term rehab (<3 hours/day)    Assistance Recommended at Discharge Frequent or constant Supervision/Assistance  Patient can return home with the following Two people to help with walking and/or  transfers;Two people to help with bathing/dressing/bathroom;Direct supervision/assist for medications management;Direct supervision/assist for financial management;Assist for transportation;Help with stairs or ramp for entrance;Assistance with cooking/housework;Assistance with feeding    Functional Status Assessment  Patient has had a recent decline in their functional status and demonstrates the ability to make significant improvements in function in a reasonable and predictable amount of time.  Equipment Recommendations  Other (comment) (defer)    Recommendations for Other Services       Precautions / Restrictions Precautions Precautions: Fall;Other (comment) Precaution Comments: watch 02, HHFNC Restrictions Weight Bearing Restrictions: No      Mobility Bed Mobility Overal bed mobility: Needs Assistance Bed Mobility: Rolling Rolling: Total assist, +2 for physical assistance         General bed mobility comments: Assist of 3 for rolling in both directions using pad and rail for pericare and changing pads due to them being saturated with urine. Cues for technique and to reach for rail. Assist needed to breathe esp when flat instead of talking. Sp02 dropped to the lowest of 84% on 20L HHFNC 60% but recovers with breathing, rest and elevating HOB.    Transfers                   General transfer comment: Defered      Balance                                           ADL either performed or assessed with clinical judgement   ADL Overall ADL's : Needs assistance/impaired     Grooming: Bed level;Set up;Wash/dry face  Upper Body Dressing : Maximal assistance;Bed level   Lower Body Dressing: Total assistance;+2 for physical assistance;+2 for safety/equipment;Bed level     Toilet Transfer Details (indicate cue type and reason): deferred Toileting- Clothing Manipulation and Hygiene: Total assistance;+2 for physical assistance;+2 for  safety/equipment;Bed level Toileting - Clothing Manipulation Details (indicate cue type and reason): +3 assist     Functional mobility during ADLs: Total assistance General ADL Comments: limited to bed level, total assist +3 to roll     Vision   Vision Assessment?: No apparent visual deficits     Perception     Praxis      Pertinent Vitals/Pain Pain Assessment Pain Assessment: Faces Faces Pain Scale: Hurts whole lot Pain Location: back, right knee Pain Descriptors / Indicators: Sore, Discomfort, Grimacing, Guarding, Moaning, Constant Pain Intervention(s): Monitored during session, Limited activity within patient's tolerance, Repositioned     Hand Dominance Right   Extremity/Trunk Assessment Upper Extremity Assessment Upper Extremity Assessment: Generalized weakness   Lower Extremity Assessment Lower Extremity Assessment: Defer to PT evaluation RLE Deficits / Details: Limited AROM at hip/knee due to stiffness/pain, difficuly with SLR and with knee flexion from recent fall RLE: Unable to fully assess due to pain RLE Sensation: decreased light touch LLE Deficits / Details: Limited AROM at hip/knee due to stiffness/pain, able to perform SLR LLE Sensation: decreased light touch   Cervical / Trunk Assessment Cervical / Trunk Assessment: Other exceptions Cervical / Trunk Exceptions: increased body habitus   Communication Communication Communication: No difficulties   Cognition Arousal/Alertness: Awake/alert Behavior During Therapy: Anxious Overall Cognitive Status: Impaired/Different from baseline Area of Impairment: Attention, Memory, Following commands, Safety/judgement, Awareness, Problem solving                   Current Attention Level: Focused Memory: Decreased recall of precautions, Decreased short-term memory Following Commands: Follows one step commands consistently, Follows one step commands with increased time Safety/Judgement: Decreased awareness of  safety, Decreased awareness of deficits Awareness: Intellectual Problem Solving: Slow processing, Decreased initiation, Difficulty sequencing, Requires verbal cues, Requires tactile cues General Comments: pt oriented and following simple commands with increased time.  Pt very tangential, some nonsensical speech, very repetative and requires frequent redirection.  Tends to wax and wane with understanding of situation and awareness.  Unsure of baseline.     General Comments  SpO2 dropped to 84% supine in bed on 20L HHFNC at 50% fio2    Exercises     Shoulder Instructions      Home Living Family/patient expects to be discharged to:: Private residence Living Arrangements: Spouse/significant other;Children Available Help at Discharge: Family;Available 24 hours/day Type of Home: Apartment Home Access: Level entry     Home Layout: One level     Bathroom Shower/Tub: Chief Strategy Officer: Handicapped height     Home Equipment: Agricultural consultant (2 wheels);Wheelchair - manual;Shower seat          Prior Functioning/Environment Prior Level of Function : Needs assist       Physical Assist : ADLs (physical);Mobility (physical) Mobility (physical): Bed mobility;Transfers   Mobility Comments: Transfers to w/c doing SPT with RW with assist from son. Reports recent fall. Non ambulatory, hx of falls. ADLs Comments: Assist needed for ADLs. Spouse bathes her in bed or in shower??? "sometimes i lay in the shower" per notes, assist needed for wiping.        OT Problem List: Decreased strength;Decreased range of motion;Decreased activity tolerance;Decreased cognition;Decreased safety awareness;Obesity;Pain;Increased edema  OT Treatment/Interventions: Self-care/ADL training;Therapeutic exercise;DME and/or AE instruction;Therapeutic activities;Patient/family education;Cognitive remediation/compensation    OT Goals(Current goals can be found in the care plan section) Acute  Rehab OT Goals Patient Stated Goal: home OT Goal Formulation: With patient Time For Goal Achievement: 02/05/22 Potential to Achieve Goals: Good  OT Frequency: Min 2X/week    Co-evaluation PT/OT/SLP Co-Evaluation/Treatment: Yes Reason for Co-Treatment: Complexity of the patient's impairments (multi-system involvement) PT goals addressed during session: Strengthening/ROM;Mobility/safety with mobility OT goals addressed during session: ADL's and self-care      AM-PAC OT "6 Clicks" Daily Activity     Outcome Measure Help from another person eating meals?: A Little Help from another person taking care of personal grooming?: A Lot Help from another person toileting, which includes using toliet, bedpan, or urinal?: Total Help from another person bathing (including washing, rinsing, drying)?: Total Help from another person to put on and taking off regular upper body clothing?: A Lot Help from another person to put on and taking off regular lower body clothing?: Total 6 Click Score: 10   End of Session Equipment Utilized During Treatment: Oxygen Nurse Communication: Mobility status  Activity Tolerance: Patient tolerated treatment well Patient left: in bed;with call bell/phone within reach;with bed alarm set;with nursing/sitter in room  OT Visit Diagnosis: Other abnormalities of gait and mobility (R26.89);Muscle weakness (generalized) (M62.81);Other symptoms and signs involving cognitive function;Pain Pain - Right/Left: Right Pain - part of body: Knee;Leg                Time: 1314-3888 OT Time Calculation (min): 40 min Charges:  OT General Charges $OT Visit: 1 Visit OT Evaluation $OT Eval Moderate Complexity: 1 Mod  Jolaine Artist, OT Acute Rehabilitation Services Office [phone]   Delight Stare 01/22/2022, 12:43 PM

## 2022-01-22 NOTE — Progress Notes (Signed)
Janice Ryan D.O.B 12/30/1955   Please be advised that the above-named patient will require a short-term nursing home stay - anticipated 30 days or less for rehabilitation and strengthening.  The plan is for return home.

## 2022-01-22 NOTE — Progress Notes (Signed)
Patient stated she cannot use BI-pap anymore, and wants to be off. On call MD notified, Rt notified. Plan of care continues.

## 2022-01-22 NOTE — TOC Progression Note (Signed)
Transition of Care [facility]) - Progression Note    Patient Details  Name: Janice Ryan MRN: [id number] Date of Birth: September 19, 1955  Transition [facility]) CM/SW Contact  Agustin Swatek, [place], [place] Phone Number: 01/22/2022, 3:04 PM  Clinical Narrative:     Phone call to patient's spouse to discuss further plan for short term rehab. VM left requesting a return call.  Marlisa Caridi, LCSW Transitions of Care    Expected Discharge Plan: Skilled Nursing Facility Barriers to Discharge: Continued Medical Work up  Expected Discharge Plan and Services Expected Discharge Plan: [place] In-house Referral: Clinical Social Work   Post Acute Care Choice: [place] Living arrangements for the past 2 months: Apartment                                       Social Determinants of Health (SDOH) Interventions    Readmission Risk Interventions     No data to display
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
## 2022-01-22 NOTE — Progress Notes (Signed)
Removed patient from bipap and placed on heated high flow cannula at 20lpm and 60%.
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
## 2022-01-22 NOTE — Assessment & Plan Note (Addendum)
Chronic, stable. - Continue home duloxetine, buspirone - Avoid sedating agents; hold trazodone, mirtazepine, gabapentin

## 2022-01-22 NOTE — Progress Notes (Signed)
Echocardiogram 2D Echocardiogram has been performed.  Oneal Deputy Twylia Oka RDCS 01/22/2022, 12:51 PM  Patient wants to make sure staff knows that her injured knee is malpositioned and causing a lot of pain and numbness in the foot below it.

## 2022-01-22 NOTE — Progress Notes (Signed)
PCO2 level 93. On call Mds, DR. Espinazo and Dr. Marcina Millard notified. New order for ABG received.

## 2022-01-22 NOTE — Progress Notes (Signed)
Called patient spouse Teola Bradley to provide updates.  Spouse had some concerns about plan for rehab/SNF as he does not think we know what we are targeting with rehab without an MRI.  She does see an orthopedic specialist but apparently they have not pursued an MRI which he expresses frustration about.  He is concerned about torn ACL but patient has never been officially diagnosed with this in the past.  I told him that our therapists are recommending rehab due to her generalized weakness and that we do not always need an MRI to benefit from therapy.

## 2022-01-22 NOTE — Plan of Care (Signed)

## 2022-01-23 ENCOUNTER — Inpatient Hospital Stay (HOSPITAL_COMMUNITY): Payer: BLUE CROSS/BLUE SHIELD

## 2022-01-23 DIAGNOSIS — Z659 Problem related to unspecified psychosocial circumstances: Secondary | ICD-10-CM | POA: Diagnosis not present

## 2022-01-23 DIAGNOSIS — J9622 Acute and chronic respiratory failure with hypercapnia: Secondary | ICD-10-CM

## 2022-01-23 DIAGNOSIS — J9621 Acute and chronic respiratory failure with hypoxia: Secondary | ICD-10-CM | POA: Diagnosis not present

## 2022-01-23 DIAGNOSIS — M25561 Pain in right knee: Secondary | ICD-10-CM | POA: Diagnosis not present

## 2022-01-23 DIAGNOSIS — W19XXXA Unspecified fall, initial encounter: Secondary | ICD-10-CM | POA: Diagnosis not present

## 2022-01-23 DIAGNOSIS — J189 Pneumonia, unspecified organism: Secondary | ICD-10-CM | POA: Diagnosis not present

## 2022-01-23 DIAGNOSIS — J9601 Acute respiratory failure with hypoxia: Secondary | ICD-10-CM | POA: Diagnosis not present

## 2022-01-23 LAB — CBC
HCT: 40.1 % (ref 36.0–46.0)
Hemoglobin: 12.2 g/dL (ref 12.0–15.0)
MCH: 28.7 pg (ref 26.0–34.0)
MCHC: 30.4 g/dL (ref 30.0–36.0)
MCV: 94.4 fL (ref 80.0–100.0)
Platelets: 248 10*3/uL (ref 150–400)
RBC: 4.25 MIL/uL (ref 3.87–5.11)
RDW: 12 % (ref 11.5–15.5)
WBC: 8.7 10*3/uL (ref 4.0–10.5)
nRBC: 0 % (ref 0.0–0.2)

## 2022-01-23 LAB — BLOOD GAS, ARTERIAL
Acid-Base Excess: 20 mmol/L — ABNORMAL HIGH (ref 0.0–2.0)
Bicarbonate: 50.8 mmol/L — ABNORMAL HIGH (ref 20.0–28.0)
O2 Saturation: 99.4 %
Patient temperature: 36.7
pCO2 arterial: 91 mmHg (ref 32–48)
pH, Arterial: 7.35 (ref 7.35–7.45)
pO2, Arterial: 97 mmHg (ref 83–108)

## 2022-01-23 LAB — BASIC METABOLIC PANEL
Anion gap: 10 (ref 5–15)
BUN: 16 mg/dL (ref 8–23)
CO2: 41 mmol/L — ABNORMAL HIGH (ref 22–32)
Calcium: 10.7 mg/dL — ABNORMAL HIGH (ref 8.9–10.3)
Chloride: 89 mmol/L — ABNORMAL LOW (ref 98–111)
Creatinine, Ser: 0.97 mg/dL (ref 0.44–1.00)
GFR, Estimated: >60 mL/min (ref >60)
Glucose, Bld: 107 mg/dL — ABNORMAL HIGH (ref 70–99)
Potassium: 3.8 mmol/L (ref 3.5–5.1)
Sodium: 140 mmol/L (ref 135–145)

## 2022-01-23 MED ORDER — AZITHROMYCIN 500 MG PO TABS
500.0000 mg | ORAL_TABLET | Freq: Every day | ORAL | Status: AC
  Administered 2022-01-23 – 2022-01-25 (×3): 500 mg via ORAL
  Filled 2022-01-23 (×3): qty 1

## 2022-01-23 MED ORDER — SODIUM CHLORIDE 0.9 % IV SOLN
2.0000 g | INTRAVENOUS | Status: AC
  Administered 2022-01-23 – 2022-01-27 (×5): 2 g via INTRAVENOUS
  Filled 2022-01-23 (×5): qty 20

## 2022-01-23 MED ORDER — DULOXETINE HCL 60 MG PO CPEP
120.0000 mg | ORAL_CAPSULE | Freq: Every day | ORAL | Status: DC
  Administered 2022-01-23 – 2022-01-29 (×7): 120 mg via ORAL
  Filled 2022-01-23 (×8): qty 2

## 2022-01-23 MED ORDER — GABAPENTIN 100 MG PO CAPS
100.0000 mg | ORAL_CAPSULE | Freq: Three times a day (TID) | ORAL | Status: DC
  Administered 2022-01-23 – 2022-01-26 (×10): 100 mg via ORAL
  Filled 2022-01-23 (×10): qty 1
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
## 2022-01-23 NOTE — Progress Notes (Signed)
Med rec with patient: Cymbalta 120 mg daily Doxepin 100 mg daily Tramadol 50 mg 4 times daily as needed Gabapentin 300 mg at night Lisinopril 20 mg daily Tylenol 2-3 times a day Vitamin D Omega  Trazadone 50 mg-does not want it here  Meloxicam 7.5 mg daily arthritis pain Advair BID Albuterol  Buspirone 5 mg TID Montelukast daily

## 2022-01-23 NOTE — Progress Notes (Signed)
Patient requests: All persons besides husband and son to be taken off contact list.   Patient requests husband to be the primary decision maker and her son next.  Patient is adamant her siblings do not make medical decisions for her not should be aloud to know any of her person information including health concerns.   Patients states her sisters and brother are very assertive, pushy and can be aggressive trying to get people to talk to them.
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
## 2022-01-23 NOTE — Progress Notes (Signed)
Pt taken off bipap and placed on 25L/50% HHFNC at this time. Pt in no distress, VS WNL. RT will continue to monitor for possible need of bipap again today, otherwise will put on for sleep tonight

## 2022-01-23 NOTE — TOC Progression Note (Addendum)
Transition of Care [facility]) - Progression Note    Patient Details  Name: Janice Ryan MRN: [id number] Date of Birth: 10-Jan-1956  Transition of Care [facility]) CM/SW [place], [place] Phone Number: 01/23/2022, 3:23 PM  Clinical Narrative:     CSW received consult to follow up with patients sister Melissa regarding home concerns for patient. CSW called patients sister [name] who informed CSW that she spoke with [place] CM regarding concerns for CSW to follow up with CM. CSW followed up with CM. CSW/CM following to speak with patient when appropriate to complete assessment. No current SNF bed offers. Barriers to placement. TOC will continue to follow.  Expected Discharge Plan: [place] Barriers to Discharge: Continued Medical Work up  Expected Discharge Plan and Services Expected Discharge Plan: [place] In-house Referral: Clinical Social Work   Post Acute Care Choice: [place] Living arrangements for the past 2 months: Apartment                                       Social Determinants of Health (SDOH) Interventions    Readmission Risk Interventions     No data to display

## 2022-01-23 NOTE — TOC Progression Note (Signed)
Transition of Care (TOC) - Progression Note  Marvetta Gibbons RN, BSN Transitions of Care Unit 4E- RN Case Manager See Treatment Team for direct phone #    Patient Details  Name: Janice Ryan MRN: [id number] Date of Birth: 1955-11-30  Transition of Care [facility]) CM/SW Contact  [name] [name], RN Phone Number: 01/23/2022, 3:35 PM  Clinical Narrative:    Received msg from attending team that pt interested now in going home w/ [facility]. CM returned to bedside to speak again with pt for transition needs.  Pt sleepy but able to arouse, asked patient what going home needed to look like for her- she responded that she wanted someone to come daily to assist with bathing and such, and do rehab. Explained to pt that [facility] services where not daily and that insurance would not cover someone to come do bathing and such. Explained that for those kind of services she would need to pay out of pocket for- insurance would cover for therapy to come out (average at this time is 2-3 visit a week for about an hour, and a bath aide a couple times a week if an aide is available within the agency that she has selected for her services). Pt then voiced "that probably will not work then" and CM asked for clarification if she wanted to still look for SNF rehab or go home- pt voiced she wanted to look for SNF bed.  CM also asked patient about her home 68- per pt she has home 02 that is in working order w/ [employer]- per pt she also has a home cpap? CM will call [employer] and try to confirm DME.   Call made to [employer]- spoke with [name]- per system pt has home -02 but not clear about equipment in system- pt was delivered a portable tank June 2023, Zach to f/u an confirm about concentrator at home.  System does not show a CPAP, looks like a Bipap was attempted in 05/2019 but was voided in the system.  [name] will follow for potential BiPap/NIV needs, as pt does have insurance now.   At this time pt does not have any SNF bed offers- CSW  to continue to follow.        Expected Discharge Plan: [place] Barriers to Discharge: Continued Medical Work up  Expected Discharge Plan and Services Expected Discharge Plan: [place] In-house Referral: Clinical Social Work   Post Acute Care Choice: [place] Living arrangements for the past 2 months: Apartment                                       Social Determinants of Health (SDOH) Interventions    Readmission Risk Interventions     No data to display

## 2022-01-23 NOTE — Progress Notes (Signed)
Placed patient on bi-pap per RT. Pt agreed to keep it all night long. Plan of care continues.

## 2022-01-24 ENCOUNTER — Other Ambulatory Visit: Payer: Self-pay

## 2022-01-24 DIAGNOSIS — J9601 Acute respiratory failure with hypoxia: Secondary | ICD-10-CM | POA: Diagnosis not present

## 2022-01-24 DIAGNOSIS — J9602 Acute respiratory failure with hypercapnia: Secondary | ICD-10-CM | POA: Diagnosis not present

## 2022-01-24 LAB — BASIC METABOLIC PANEL
Anion gap: 16 — ABNORMAL HIGH (ref 5–15)
BUN: 15 mg/dL (ref 8–23)
CO2: 39 mmol/L — ABNORMAL HIGH (ref 22–32)
Calcium: 11.5 mg/dL — ABNORMAL HIGH (ref 8.9–10.3)
Chloride: 86 mmol/L — ABNORMAL LOW (ref 98–111)
Creatinine, Ser: 0.84 mg/dL (ref 0.44–1.00)
GFR, Estimated: >60 mL/min (ref >60)
Glucose, Bld: 120 mg/dL — ABNORMAL HIGH (ref 70–99)
Potassium: 3.5 mmol/L (ref 3.5–5.1)
Sodium: 141 mmol/L (ref 135–145)

## 2022-01-24 MED ORDER — FUROSEMIDE 40 MG PO TABS
40.0000 mg | ORAL_TABLET | Freq: Every day | ORAL | Status: DC
  Administered 2022-01-24: 40 mg via ORAL
  Filled 2022-01-24: qty 1

## 2022-01-24 MED ORDER — LISINOPRIL 20 MG PO TABS
20.0000 mg | ORAL_TABLET | Freq: Every day | ORAL | Status: DC
  Administered 2022-01-24: 20 mg via ORAL
  Filled 2022-01-24: qty 1
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
## 2022-01-24 NOTE — Progress Notes (Signed)
Physical Therapy Treatment Patient Details Name: Janice Ryan MRN: [id number] DOB: Apr 17, 1955 Today's Date: 01/24/2022   History of Present Illness Patient is a 66 y/o female who presents on 10/20 with fall at home. Admitted with acute on chronic hypercarbic respiratory failure in the setting of narcotic administration secondary to knee pain. Required BiPAP and not HHFNC. PMH includes morbid obesity, asthma, anxiety, chronic right knee pain (reports torn ACL inr ight knee which is recent and needs surgery).    PT Comments    Pt received supine, eager and motivated for OOB mobility with excellent progress with mobility. Pt able to come to sitting EOB with mod assist to manage RLE and to elevate trunk. Pt able to come to standing with EVA walker and min assist for x4 trials, pt able to achieve full upright standing for last 2 trials and maintain ~30 seconds each. Pt continues to be limited by R knee pain, general weakness and impaired balance/postural reactions. Pt pleasant and cooperative throughout session and current plan remains appropriate to address deficits and maximize functional independence and decrease caregiver burden. Pt continues to benefit from skilled PT services to progress toward functional mobility goals.    Recommendations for follow up therapy are one component of a multi-disciplinary discharge planning process, led by the attending physician.  Recommendations may be updated based on patient status, additional functional criteria and insurance authorization.  Follow Up Recommendations  Skilled nursing-short term rehab (<3 hours/day) Can patient physically be transported by private vehicle: No   Assistance Recommended at Discharge Frequent or constant Supervision/Assistance  Patient can return home with the following Two people to help with walking and/or transfers;Two people to help with bathing/dressing/bathroom;Assist for transportation;Direct supervision/assist for  medications management;Assistance with cooking/housework;Direct supervision/assist for financial management   Equipment Recommendations  None recommended by PT    Recommendations for Other Services       Precautions / Restrictions Precautions Precautions: Fall;Other (comment) Precaution Comments: watch 02, HHFNC Restrictions Weight Bearing Restrictions: No     Mobility  Bed Mobility Overal bed mobility: Needs Assistance Bed Mobility: Supine to Sit, Sit to Supine     Supine to sit: Mod assist Sit to supine: Max assist   General bed mobility comments: mod assist to elevate trunk and bring RLE to and off EOB, max assist to being BLE into bed, deflated bed once sitting EOB so pt feet could be on floor    Transfers Overall transfer level: Needs assistance Equipment used: Bilateral platform walker (EVA walker) Transfers: Sit to/from Stand Sit to Stand: Min assist           General transfer comment: min assist to power up with EVA walker. pt able to elevate hips off bed for first 2 trials with min assist, able to acheiev full upright standing with EVA walker for x2 additional trials with min assist and encouragement, able to maintain upright standing ~30seconds each trial, pt declining transfer to chair    Ambulation/Gait                   Stairs             Wheelchair Mobility    Modified Rankin (Stroke Patients Only)       Balance                                            Cognition  Arousal/Alertness: Awake/alert Behavior During Therapy: WFL for tasks assessed/performed, Anxious (some light anxiety at start of session regarding mobility, but resolving) Overall Cognitive Status: Within Functional Limits for tasks assessed                                          Exercises      General Comments General comments (skin integrity, edema, etc.): VSS on supplemetnal O2, pt with 3-4/4 DOE during activity however  SpO2 stable, HR up to 121bpm with activity, pt brother present and encouragement      Pertinent Vitals/Pain Pain Assessment Pain Assessment: Faces Faces Pain Scale: Hurts little more Pain Location: back, right knee Pain Descriptors / Indicators: Sore, Discomfort, Grimacing, Guarding, Moaning, Constant Pain Intervention(s): Monitored during session, Limited activity within patient's tolerance, Repositioned    Home Living                          Prior Function            PT Goals (current goals can now be found in the care plan section) Acute Rehab PT Goals Patient Stated Goal: to be able to walk without AD PT Goal Formulation: With patient Time For Goal Achievement: 02/05/22    Frequency    Min 3X/week      PT Plan      Co-evaluation              AM-PAC PT "6 Clicks" Mobility   Outcome Measure  Help needed turning from your back to your side while in a flat bed without using bedrails?: Total Help needed moving from lying on your back to sitting on the side of a flat bed without using bedrails?: Total Help needed moving to and from a bed to a chair (including a wheelchair)?: A Lot Help needed standing up from a chair using your arms (e.g., wheelchair or bedside chair)?: A Lot Help needed to walk in hospital room?: Total Help needed climbing 3-5 steps with a railing? : Total 6 Click Score: 8    End of Session Equipment Utilized During Treatment: Oxygen Activity Tolerance: Patient tolerated treatment well Patient left: in bed;with call bell/phone within reach;with family/visitor present Nurse Communication: Mobility status PT Visit Diagnosis: Pain Pain - Right/Left: Right Pain - part of body: Knee (back)     Time: 1443-1540 PT Time Calculation (min) (ACUTE ONLY): 36 min  Charges:  $Therapeutic Activity: 23-37 mins                    Roverto Bodmer R. PTA Acute Rehabilitation Services Office: [place] 01/24/2022, 12:58 PM

## 2022-01-24 NOTE — Assessment & Plan Note (Addendum)
Ca mildly elevated, vitamin D low, iCal mildly elevated 6.0, PTH normal.  Asymptomatic, no concerning findings on EKG. -Follow-up outpatient

## 2022-01-24 NOTE — TOC Progression Note (Addendum)
Transition of Care [facility]) - Progression Note    Patient Details  Name: Janice Ryan MRN: [id number] Date of Birth: 02-17-1956  [facility] of Care [facility]) CM/SW [place], [place] Phone Number: 01/24/2022, 2:46 PM  Clinical Narrative:     CM informed CSW patient agreeable to SNF. Current barriers to placement . Patient gave permission to fax out further to [place], and [facility]. Pending SNF bed offers. Patients passr currently pending. CSW submitted requested clinicals to [place] must for review.CSW will continue to follow and assist with patients dc planning needs.  Expected Discharge Plan: [place] Barriers to Discharge: Continued Medical Work up  Expected Discharge Plan and Services Expected Discharge Plan: [place] In-house Referral: Clinical Social Work   Post Acute Care Choice: [place] Living arrangements for the past 2 months: Apartment                                       Social Determinants of Health (SDOH) Interventions    Readmission Risk Interventions     No data to display

## 2022-01-25 DIAGNOSIS — J9601 Acute respiratory failure with hypoxia: Secondary | ICD-10-CM | POA: Diagnosis not present

## 2022-01-25 DIAGNOSIS — N179 Acute kidney failure, unspecified: Secondary | ICD-10-CM

## 2022-01-25 DIAGNOSIS — J9602 Acute respiratory failure with hypercapnia: Secondary | ICD-10-CM | POA: Diagnosis not present

## 2022-01-25 LAB — COMPREHENSIVE METABOLIC PANEL
ALT: 17 U/L (ref 0–44)
AST: 22 U/L (ref 15–41)
Albumin: 3 g/dL — ABNORMAL LOW (ref 3.5–5.0)
Alkaline Phosphatase: 66 U/L (ref 38–126)
Anion gap: 10 (ref 5–15)
BUN: 18 mg/dL (ref 8–23)
CO2: 39 mmol/L — ABNORMAL HIGH (ref 22–32)
Calcium: 11.2 mg/dL — ABNORMAL HIGH (ref 8.9–10.3)
Chloride: 91 mmol/L — ABNORMAL LOW (ref 98–111)
Creatinine, Ser: 1.35 mg/dL — ABNORMAL HIGH (ref 0.44–1.00)
GFR, Estimated: 44 mL/min — ABNORMAL LOW (ref >60)
Glucose, Bld: 112 mg/dL — ABNORMAL HIGH (ref 70–99)
Potassium: 3.8 mmol/L (ref 3.5–5.1)
Sodium: 140 mmol/L (ref 135–145)
Total Bilirubin: 0.7 mg/dL (ref 0.3–1.2)
Total Protein: 6.8 g/dL (ref 6.5–8.1)

## 2022-01-25 LAB — VITAMIN D 25 HYDROXY (VIT D DEFICIENCY, FRACTURES): Vit D, 25-Hydroxy: 22.87 ng/mL — ABNORMAL LOW (ref 30–100)

## 2022-01-25 MED ORDER — FLUTICASONE PROPIONATE 50 MCG/ACT NA SUSP
2.0000 | Freq: Every day | NASAL | Status: DC
  Administered 2022-01-25 – 2022-01-29 (×5): 2 via NASAL
  Filled 2022-01-25: qty 16

## 2022-01-25 MED ORDER — DICLOFENAC SODIUM 1 % EX GEL
4.0000 g | Freq: Four times a day (QID) | CUTANEOUS | Status: DC
  Administered 2022-01-25 – 2022-01-26 (×2): 4 g via TOPICAL
  Filled 2022-01-25: qty 100
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
## 2022-01-25 NOTE — TOC Progression Note (Signed)
Transition of Care (TOC) - Progression Note  Marvetta Gibbons RN, BSN Transitions of Care Unit 4E- RN Case Manager See Treatment Team for direct phone #    Patient Details  Name: Janice Ryan MRN: [id number] Date of Birth: 19-Aug-1955  Transition of Care [facility]) CM/SW Contact  [name] [name], RN Phone Number: 01/25/2022, 3:51 PM  Clinical Narrative:    Noted updated recommendations for INPT rehab, [facility] INPT rehab has screened pt and they are not in-network with insurance.  CM in to speak with pt about updated recommendations and SNF vs AIR options. Currently pt does not have any SNF bed offers- she states she is open to faxing further out- explained that the barrier to offers is her bariatric size. Discussed the option for INPT rehab- and that there are 2 in [place] one in [facility] that we can check with to see if her insurance is in-network and if they might offer a bed. Pt is agreeable to fax out to the [facility] rehabs in [place] and [place].   Pt further shared that her family is assisting to make things better at home by helping her get a hospital bed, BSC, etc. CM also asked again about the provider of her home 02- pt to call her son and find out -then let this Probation officer know.   [facility] updated this writer this AM that they will not be able to assist with NIV or home 02 as they are currently out-of-network with pt's insurance.   CM made call to [facility] in [place] under review- will f/u on AIR options.    Expected Discharge Plan: [place] Barriers to Discharge: Continued Medical Work up  Expected Discharge Plan and Services Expected Discharge Plan: [place] In-house Referral: Clinical Social Work   Post Acute Care Choice: [place] Living arrangements for the past 2 months: Apartment                                       Social Determinants of Health (SDOH) Interventions    Readmission Risk  Interventions     No data to display

## 2022-01-25 NOTE — TOC Progression Note (Signed)
Transition of Care [facility]) - Progression Note    Patient Details  Name: Pebble Botkin MRN: [id number] Date of Birth: [birth year]  Transition of Care [facility]) CM/SW [place], [place] Phone Number: 01/25/2022, 10:19 AM  Clinical Narrative:     Patient had no ned offers  Thurmond Butts, MSW, LCSW Clinical Social Worker    Expected Discharge Plan: [place] Barriers to Discharge: Continued Medical Work up  Expected Discharge Plan and Services Expected Discharge Plan: [place] In-house Referral: Clinical Social Work   Post Acute Care Choice: [place] Living arrangements for the past 2 months: Apartment                                       Social Determinants of Health (SDOH) Interventions    Readmission Risk Interventions     No data to display

## 2022-01-25 NOTE — Assessment & Plan Note (Addendum)
Improving, creatinine 1.14 from 1.67.  Received 1 dose of Lasix and lisinopril 10/24 which was likely contributing.  Good p.o. intake. -Hold Lasix and lisinopril  -Encourage p.o. hydration -A.m. BMP, recheck outpatient before restarting meds

## 2022-01-25 NOTE — Progress Notes (Signed)
Physical Therapy Treatment Patient Details Name: Janice Ryan MRN: [id number] DOB: 1955/11/03 Today's Date: 01/25/2022   History of Present Illness Patient is a 66 y/o female who presents on 10/20 with fall at home. Admitted with acute on chronic hypercarbic respiratory failure in the setting of narcotic administration secondary to knee pain. Required BiPAP and not HHFNC. PMH includes morbid obesity, asthma, anxiety, chronic right knee pain (reports torn ACL inr ight knee which is recent and needs surgery).    PT Comments    Pt seen for additional session to assist RN with transfer back to bed. Pt reporting increased back and RLE pain, but tolerated sitting in chair > 3 hours. Pt requiring two person min-mod assist for bed mobility and transfers using Harmon Pier walker. Will continue to progress as tolerated.    Recommendations for follow up therapy are one component of a multi-disciplinary discharge planning process, led by the attending physician.  Recommendations may be updated based on patient status, additional functional criteria and insurance authorization.  Follow Up Recommendations  Acute inpatient rehab (3hours/day) Can patient physically be transported by private vehicle: No   Assistance Recommended at Discharge Frequent or constant Supervision/Assistance  Patient can return home with the following Two people to help with walking and/or transfers;Two people to help with bathing/dressing/bathroom;Assist for transportation;Direct supervision/assist for medications management;Assistance with cooking/housework;Direct supervision/assist for financial management   Equipment Recommendations  None recommended by PT    Recommendations for Other Services       Precautions / Restrictions Precautions Precautions: Fall;Other (comment) Precaution Comments: watch 02, HFNC Restrictions Weight Bearing Restrictions: No     Mobility  Bed Mobility Overal bed mobility: Needs  Assistance Bed Mobility: Sit to Supine     Supine to sit: Min assist, +2 for safety/equipment Sit to supine: Mod assist, +2 for physical assistance   General bed mobility comments: Assist for BLE's, use of bed pad to scoot hips back onto bed    Transfers Overall transfer level: Needs assistance Equipment used: Bilateral platform walker (EVA walker) Transfers: Sit to/from Stand, Bed to chair/wheelchair/BSC Sit to Stand: Min assist, +2 safety/equipment   Step pivot transfers: Min assist, +2 physical assistance, +2 safety/equipment       General transfer comment: min assist to power up and steady from recliner, then stepping over to bed with flexed posture    Ambulation/Gait                   Stairs             Wheelchair Mobility    Modified Rankin (Stroke Patients Only)       Balance Overall balance assessment: Needs assistance Sitting-balance support: No upper extremity supported, Feet supported Sitting balance-Leahy Scale: Fair     Standing balance support: Bilateral upper extremity supported, During functional activity Standing balance-Leahy Scale: Fair Standing balance comment: relies on BUE and external support                            Cognition Arousal/Alertness: Awake/alert Behavior During Therapy: WFL for tasks assessed/performed, Anxious Overall Cognitive Status: Within Functional Limits for tasks assessed                                 General Comments: much improved cognition, pt engaging appropriately today        Exercises      General Comments  Pertinent Vitals/Pain Pain Assessment Pain Assessment: Faces Faces Pain Scale: Hurts even more Pain Location: back, right knee Pain Descriptors / Indicators: Sore, Discomfort, Grimacing, Guarding, Moaning, Constant Pain Intervention(s): Limited activity within patient's tolerance, Monitored during session, Patient requesting pain meds-RN notified     Home Living                          Prior Function            PT Goals (current goals can now be found in the care plan section) Acute Rehab PT Goals Patient Stated Goal: to be able to walk without AD Potential to Achieve Goals: Good Progress towards PT goals: Progressing toward goals    Frequency    Min 3X/week      PT Plan Current plan remains appropriate    Co-evaluation PT/OT/SLP Co-Evaluation/Treatment: Yes Reason for Co-Treatment: For patient/therapist safety;To address functional/ADL transfers PT goals addressed during session: Mobility/safety with mobility        AM-PAC PT "6 Clicks" Mobility   Outcome Measure  Help needed turning from your back to your side while in a flat bed without using bedrails?: A Little Help needed moving from lying on your back to sitting on the side of a flat bed without using bedrails?: A Little Help needed moving to and from a bed to a chair (including a wheelchair)?: A Little Help needed standing up from a chair using your arms (e.g., wheelchair or bedside chair)?: A Little Help needed to walk in hospital room?: Total Help needed climbing 3-5 steps with a railing? : Total 6 Click Score: 14    End of Session Equipment Utilized During Treatment: Oxygen Activity Tolerance: Patient tolerated treatment well Patient left: with call bell/phone within reach;in bed Nurse Communication: Mobility status PT Visit Diagnosis: Pain Pain - Right/Left: Right Pain - part of body: Knee     Time: 1359-1413 PT Time Calculation (min) (ACUTE ONLY): 14 min  Charges:  $Therapeutic Activity: 8-22 mins                     Janice Ryan, PT, DPT Acute Rehabilitation Services Office [phone]    Deno Etienne 01/25/2022, 4:45 PM

## 2022-01-25 NOTE — Progress Notes (Signed)
Physical Therapy Treatment Patient Details Name: Pegge Cumberledge MRN: [id number] DOB: 1955-11-13 Today's Date: 01/25/2022   History of Present Illness Patient is a 66 y/o female who presents on 10/20 with fall at home. Admitted with acute on chronic hypercarbic respiratory failure in the setting of narcotic administration secondary to knee pain. Required BiPAP and not HHFNC. PMH includes morbid obesity, asthma, anxiety, chronic right knee pain (reports torn ACL inr ight knee which is recent and needs surgery).    PT Comments    Pt making steady progress towards her physical therapy goals; pt verbalizing eagerness to participate and excited to sit up in the chair. Session focused on functional mobility including transfer training from bed and bedside commode. Pt requiring two person minimal assist overall with use of Eva walker. SpO2 > 94% on 8L O2 via HFNC, HR up to 120 bpm. Pt demonstrates generalized weakness, compromised cardiopulmonary system, impaired standing balance. Based on age and motivation, suspect continued progress. Pt has good family support at home. Would benefit from post acute rehab to address deficits and maximize functional mobility.    Recommendations for follow up therapy are one component of a multi-disciplinary discharge planning process, led by the attending physician.  Recommendations may be updated based on patient status, additional functional criteria and insurance authorization.  Follow Up Recommendations  Acute inpatient rehab (3hours/day) Can patient physically be transported by private vehicle: No   Assistance Recommended at Discharge Frequent or constant Supervision/Assistance  Patient can return home with the following Two people to help with walking and/or transfers;Two people to help with bathing/dressing/bathroom;Assist for transportation;Direct supervision/assist for medications management;Assistance with cooking/housework;Direct supervision/assist for  financial management   Equipment Recommendations  None recommended by PT    Recommendations for Other Services       Precautions / Restrictions Precautions Precautions: Fall;Other (comment) Precaution Comments: watch 02, HFNC Restrictions Weight Bearing Restrictions: No     Mobility  Bed Mobility Overal bed mobility: Needs Assistance Bed Mobility: Supine to Sit     Supine to sit: Min assist, +2 for safety/equipment     General bed mobility comments: min assist for R LE towards EOB, cueing for technique and functional reaching to use UEs to elevate trunk using rails.    Transfers Overall transfer level: Needs assistance Equipment used: Bilateral platform walker (EVA walker) Transfers: Sit to/from Stand, Bed to chair/wheelchair/BSC Sit to Stand: Min assist, +2 safety/equipment   Step pivot transfers: Min assist, +2 physical assistance, +2 safety/equipment       General transfer comment: min assist to power up and steady from EOB and BSC, stepping to [facility] and then to recliner    Ambulation/Gait                   Stairs             Wheelchair Mobility    Modified Rankin (Stroke Patients Only)       Balance Overall balance assessment: Needs assistance Sitting-balance support: No upper extremity supported, Feet supported Sitting balance-Leahy Scale: Fair     Standing balance support: Bilateral upper extremity supported, During functional activity Standing balance-Leahy Scale: Fair Standing balance comment: relies on BUE and external support                            Cognition Arousal/Alertness: Awake/alert Behavior During Therapy: WFL for tasks assessed/performed, Anxious Overall Cognitive Status: Within Functional Limits for tasks assessed  General Comments: much improved cognition, pt engaging appropriately today        Exercises      General Comments General comments (skin  integrity, edema, etc.): VSS on 8L HFNC, HR up to 120s with activity.      Pertinent Vitals/Pain Pain Assessment Pain Assessment: Faces Faces Pain Scale: Hurts little more Pain Location: back, right knee Pain Descriptors / Indicators: Sore, Discomfort, Grimacing, Guarding, Moaning, Constant Pain Intervention(s): Monitored during session    Home Living                          Prior Function            PT Goals (current goals can now be found in the care plan section) Acute Rehab PT Goals Potential to Achieve Goals: Good Progress towards PT goals: Progressing toward goals    Frequency    Min 3X/week      PT Plan Discharge plan needs to be updated    Co-evaluation PT/OT/SLP Co-Evaluation/Treatment: Yes Reason for Co-Treatment: For patient/therapist safety;To address functional/ADL transfers PT goals addressed during session: Mobility/safety with mobility OT goals addressed during session: ADL's and self-care      AM-PAC PT "6 Clicks" Mobility   Outcome Measure  Help needed turning from your back to your side while in a flat bed without using bedrails?: A Little Help needed moving from lying on your back to sitting on the side of a flat bed without using bedrails?: A Little Help needed moving to and from a bed to a chair (including a wheelchair)?: A Little Help needed standing up from a chair using your arms (e.g., wheelchair or bedside chair)?: A Little Help needed to walk in hospital room?: Total Help needed climbing 3-5 steps with a railing? : Total 6 Click Score: 14    End of Session Equipment Utilized During Treatment: Oxygen Activity Tolerance: Patient tolerated treatment well Patient left: in chair;with call bell/phone within reach Nurse Communication: Mobility status PT Visit Diagnosis: Pain Pain - Right/Left: Right Pain - part of body: Knee     Time: 1027-1101 PT Time Calculation (min) (ACUTE ONLY): 34 min  Charges:  $Therapeutic  Activity: 8-22 mins                     Wyona Almas, PT, DPT Acute Rehabilitation Services Office [phone]    Deno Etienne 01/25/2022, 1:43 PM

## 2022-01-25 NOTE — Progress Notes (Signed)
Mobility Specialist Progress Note:   01/25/22 1208  Mobility  Activity  (STS from chair)  Level of Assistance Minimal assist, patient does 75% or more (+2)  Assistive Device  (EVA)  Activity Response Tolerated well  $Mobility charge 1 Mobility   RN asking for assistance standing pt to get pillows under bottom. Complaints of abdominal pain. MinA+2 to stand with Harmon Pier walker. Left in chair with call bell in reach and all needs met.   [facility] Surveyor, mining Chat only

## 2022-01-25 NOTE — Progress Notes (Signed)
Inpatient Rehab Admissions Coordinator:   Per therapy recommendation,  patient was screened for CIR candidacy by [employer]. Pt.'s in network is out of network for CIR at [facility]; however, if AIR is desired, TOC may want to look into other AIRs that are in network with her insurance.   Rehab Admissions Coordinator  [phone] (celll) [phone] (office)

## 2022-01-25 NOTE — Progress Notes (Signed)
Occupational Therapy Treatment Patient Details Name: Janice Ryan MRN: [id number] DOB: 1955-05-11 Today's Date: 01/25/2022   History of present illness Patient is a 66 y/o female who presents on 10/20 with fall at home. Admitted with acute on chronic hypercarbic respiratory failure in the setting of narcotic administration secondary to knee pain. Required BiPAP and not HHFNC. PMH includes morbid obesity, asthma, anxiety, chronic right knee pain (reports torn ACL inr ight knee which is recent and needs surgery).   OT comments  Patient progressing well, has met OT goals and updated today.  Patient completing bed mobility with min assist, transfers with min assist +2 using eva walker to [facility].  Toileting requires total assist after + BM.  Cognition is much clearer than over the weekend as she is following commands, engaging with therapist and asking appropriate questions today. She remains limited by decreased activity tolerance, generalized weakness, decreased cardiopulmonary status and impaired balance- updated dc plan to CIR at this time.  Will follow.   Recommendations for follow up therapy are one component of a multi-disciplinary discharge planning process, led by the attending physician.  Recommendations may be updated based on patient status, additional functional criteria and insurance authorization.    Follow Up Recommendations  Acute inpatient rehab (3hours/day)    Assistance Recommended at Discharge Frequent or constant Supervision/Assistance  Patient can return home with the following  Two people to help with walking and/or transfers;Two people to help with bathing/dressing/bathroom;Assist for transportation;Help with stairs or ramp for entrance;Assistance with cooking/housework   Equipment Recommendations  Other (comment) (defer)    Recommendations for Other Services Rehab consult    Precautions / Restrictions Precautions Precautions: Fall;Other (comment) Precaution  Comments: watch 02, HFNC Restrictions Weight Bearing Restrictions: No       Mobility Bed Mobility Overal bed mobility: Needs Assistance Bed Mobility: Supine to Sit     Supine to sit: Min assist, +2 for safety/equipment     General bed mobility comments: min assist for R LE towards EOB, cueing for technique and functional reaching to use UEs to elevate trunk using rails.    Transfers Overall transfer level: Needs assistance Equipment used: Bilateral platform walker (EVA walker) Transfers: Sit to/from Stand, Bed to chair/wheelchair/BSC Sit to Stand: Min assist, +2 safety/equipment     Step pivot transfers: Min assist, +2 physical assistance, +2 safety/equipment     General transfer comment: min assist to power up and steady from EOB and BSC, stepping to [facility] and then to recliner     Balance Overall balance assessment: Needs assistance Sitting-balance support: No upper extremity supported, Feet supported Sitting balance-Leahy Scale: Fair     Standing balance support: Bilateral upper extremity supported, During functional activity Standing balance-Leahy Scale: Fair Standing balance comment: relies on BUE and external support                           ADL either performed or assessed with clinical judgement   ADL Overall ADL's : Needs assistance/impaired     Grooming: Set up;Sitting;Wash/dry face               Lower Body Dressing: Total assistance;+2 for physical assistance;+2 for safety/equipment;Sit to/from stand Lower Body Dressing Details (indicate cue type and reason): pt relies on BUE support in standing     Toileting- Clothing Manipulation and Hygiene: Total assistance;+2 for physical assistance;+2 for safety/equipment;Sit to/from stand       Functional mobility during ADLs: Minimal assistance;+2 for physical assistance;+2  for safety/equipment (eva walker)      Extremity/Trunk Assessment              Vision       Perception      Praxis      Cognition Arousal/Alertness: Awake/alert Behavior During Therapy: WFL for tasks assessed/performed, Anxious Overall Cognitive Status: Within Functional Limits for tasks assessed                                 General Comments: much improved cognition, pt engaging appropraitely today        Exercises      Shoulder Instructions       General Comments VSS on 8L HFNC, HR up to 120s with activity.    Pertinent Vitals/ Pain       Pain Assessment Pain Assessment: Faces Faces Pain Scale: Hurts little more Pain Location: back, right knee Pain Descriptors / Indicators: Sore, Discomfort, Grimacing, Guarding, Moaning, Constant Pain Intervention(s): Limited activity within patient's tolerance, Monitored during session, Repositioned  Home Living                                          Prior Functioning/Environment              Frequency  Min 2X/week        Progress Toward Goals  OT Goals(current goals can now be found in the care plan section)  Progress towards OT goals: Goals met and updated - see care plan  Acute Rehab OT Goals Patient Stated Goal: home OT Goal Formulation: With patient Time For Goal Achievement: 02/05/22 Potential to Achieve Goals: Good  Plan Frequency remains appropriate;Discharge plan needs to be updated    Co-evaluation    PT/OT/SLP Co-Evaluation/Treatment: Yes Reason for Co-Treatment: For patient/therapist safety;To address functional/ADL transfers   OT goals addressed during session: ADL's and self-care      AM-PAC OT "6 Clicks" Daily Activity     Outcome Measure   Help from another person eating meals?: A Little Help from another person taking care of personal grooming?: A Little Help from another person toileting, which includes using toliet, bedpan, or urinal?: Total Help from another person bathing (including washing, rinsing, drying)?: A Lot Help from another person to put on and  taking off regular upper body clothing?: A Lot Help from another person to put on and taking off regular lower body clothing?: Total 6 Click Score: 12    End of Session Equipment Utilized During Treatment: Oxygen  OT Visit Diagnosis: Other abnormalities of gait and mobility (R26.89);Muscle weakness (generalized) (M62.81);Other symptoms and signs involving cognitive function;Pain Pain - Right/Left: Right Pain - part of body: Knee;Leg   Activity Tolerance Patient tolerated treatment well   Patient Left with call bell/phone within reach;with bed alarm set;in chair;with chair alarm set   Nurse Communication Mobility status        Time: 1028-1101 OT Time Calculation (min): 33 min  Charges: OT General Charges $OT Visit: 1 Visit OT Treatments $Self Care/Home Management : 8-22 mins  Christie B, OT Acute Rehabilitation Services Office [phone]   Christie S Burns 01/25/2022, 11:25 AM 

## 2022-01-26 ENCOUNTER — Inpatient Hospital Stay (HOSPITAL_COMMUNITY): Payer: BLUE CROSS/BLUE SHIELD

## 2022-01-26 DIAGNOSIS — J9622 Acute and chronic respiratory failure with hypercapnia: Secondary | ICD-10-CM | POA: Diagnosis not present

## 2022-01-26 DIAGNOSIS — J9621 Acute and chronic respiratory failure with hypoxia: Secondary | ICD-10-CM | POA: Diagnosis not present

## 2022-01-26 LAB — PTH, INTACT AND CALCIUM
Calcium, Total (PTH): 11.1 mg/dL — ABNORMAL HIGH (ref 8.7–10.3)
PTH: 41 pg/mL (ref 15–65)

## 2022-01-26 LAB — PULMONARY FUNCTION TEST
FEF 25-75 Pre: 1.4 L/sec
FEF2575-%Pred-Pre: 59 %
FEV1-%Pred-Pre: 52 %
FEV1-Pre: 1.43 L
FEV1FVC-%Pred-Pre: 105 %
FEV6-%Pred-Pre: 51 %
FEV6-Pre: 1.76 L
FEV6FVC-%Pred-Pre: 103 %
FVC-%Pred-Pre: 49 %
FVC-Pre: 1.76 L
Pre FEV1/FVC ratio: 81 %
Pre FEV6/FVC Ratio: 100 %

## 2022-01-26 LAB — BASIC METABOLIC PANEL
Anion gap: 9 (ref 5–15)
BUN: 27 mg/dL — ABNORMAL HIGH (ref 8–23)
CO2: 37 mmol/L — ABNORMAL HIGH (ref 22–32)
Calcium: 10.8 mg/dL — ABNORMAL HIGH (ref 8.9–10.3)
Chloride: 89 mmol/L — ABNORMAL LOW (ref 98–111)
Creatinine, Ser: 1.67 mg/dL — ABNORMAL HIGH (ref 0.44–1.00)
GFR, Estimated: 34 mL/min — ABNORMAL LOW (ref >60)
Glucose, Bld: 114 mg/dL — ABNORMAL HIGH (ref 70–99)
Potassium: 3.9 mmol/L (ref 3.5–5.1)
Sodium: 135 mmol/L (ref 135–145)

## 2022-01-26 LAB — CALCIUM, IONIZED: Calcium, Ionized, Serum: 6 mg/dL — ABNORMAL HIGH (ref 4.5–5.6)

## 2022-01-26 MED ORDER — GABAPENTIN 400 MG PO CAPS
400.0000 mg | ORAL_CAPSULE | Freq: Every day | ORAL | Status: DC
  Administered 2022-01-26 – 2022-01-28 (×3): 400 mg via ORAL
  Filled 2022-01-26 (×3): qty 1

## 2022-01-26 MED ORDER — DICLOFENAC SODIUM 1 % EX GEL
4.0000 g | Freq: Four times a day (QID) | CUTANEOUS | Status: DC | PRN
  Administered 2022-01-26 – 2022-01-27 (×4): 4 g via TOPICAL

## 2022-01-26 NOTE — TOC Progression Note (Signed)
Transition of Care [facility]) - Progression Note    Patient Details  Name: Janice Ryan MRN: [id number] Date of Birth: 04-27-1955  Transition of [facility]) CM/SW [place], [place] Phone Number: 01/26/2022, 2:32 PM  Clinical Narrative:     Patient has no bed offers at this time.   Thurmond Butts, MSW, LCSW Clinical Social Worker    Expected Discharge Plan: Skilled Nursing Facility Barriers to Discharge: Continued Medical Work up  Expected Discharge Plan and Services Expected Discharge Plan: [place] In-house Referral: Clinical Social Work   Post Acute Care Choice: [place] Living arrangements for the past 2 months: Apartment                                       Social Determinants of Health (SDOH) Interventions    Readmission Risk Interventions     No data to display

## 2022-01-26 NOTE — TOC Progression Note (Addendum)
Transition of Care (TOC) - Progression Note  Marvetta Gibbons RN, BSN Transitions of Care Unit 4E- RN Case Manager See Treatment Team for direct phone #    Patient Details  Name: Janice Ryan MRN: [id number] Date of Birth: November 01, 1955  Transition of Care [facility]) CM/SW Contact  [name], [name], RN Phone Number: 01/26/2022, 4:03 PM  Clinical Narrative:    CM follow up done with other area AIR- per [name] at [place] they are unable to offer bed due to BMI and their MD has concerns that pt will be able to tolerate the intensity of AIR program.  Call made to [place] rehab- pt's BMI to high to accept referral.   CM spoke with pt and informed her that the AIR would not be an option, and CSW also has f/u on SNF options- with no Bed offers.  Pt's insurance and elevated BMI are the main barriers.   Discussed with pt that we would need to start thinking about returning home with possible HH, pt to have family bring info on home 02 concentrator - she believes it was supplied by [employer] back when she did not have insurance- and states she has a letter from them that states they allowed her to keep it but are not servicing it.  Pt agreeable for this writer to reach out to other local DME agencies to see if one can be found that is in network for NIV/02 needs.   Call made to [place] with [name]- after checking insurance- [facility] Romans would have to seek auth with plan for NIV/02 needs- they are willing to work with pt to see if they can get insurance approval for her NIV/home 02 needs. CM will await paperwork from [name] that will need to be signed by MD in order to start insurance auth.  1700- Form placed on shadow chart for home NIV needs- await MD signature and narrative  Expected Discharge Plan: [place] Barriers to Discharge: Continued Medical Work up  Expected Discharge Plan and Services Expected Discharge Plan: [place] In-house Referral: Clinical Social  Work   Post Acute Care Choice: [place] Living arrangements for the past 2 months: Apartment                                       Social Determinants of Health (SDOH) Interventions    Readmission Risk Interventions     No data to display

## 2022-01-26 NOTE — Progress Notes (Signed)
Pt is on BiPAP at night. She is well tolerated. Hemodynamics stable, afebrile. No acute distress noted. We will continue to monitor.  Kennyth Lose, RN

## 2022-01-26 NOTE — Progress Notes (Signed)
Physical Therapy Treatment Patient Details Name: Janice Ryan MRN: [id number] DOB: Feb 22, 1956 Today's Date: 01/26/2022   History of Present Illness Patient is a 66 y/o female who presents on 01/20/22 with fall at home. Admitted with acute on chronic hypercarbic respiratory failure in the setting of narcotic administration secondary to knee pain. Required BiPAP and not HFNC. PMH includes morbid obesity, asthma, anxiety, chronic right knee pain (reports torn ACL inr ight knee which is recent and needs surgery).    PT Comments    Pt progressing with mobility. Today's session focused on dynamic sitting balance and transfers. Pt required min-modA to stand, ultimately require minA+2 to complete bed > chair transfer to guide hips due to EVA wheel stuck on bed. Pt educated on importance of mobility on B LE due to knee pain. Pt remains limited by generalized weakness, decreased activity tolerance, and impaired balance strategies/postural reactions. Continue to recommend acute PT services to maximize functional mobility and independence prior to d/c from hospital.  SpO2 >92% on 3L HFNC during session     Recommendations for follow up therapy are one component of a multi-disciplinary discharge planning process, led by the attending physician.  Recommendations may be updated based on patient status, additional functional criteria and insurance authorization.  Follow Up Recommendations  Acute inpatient rehab (3hours/day)     Assistance Recommended at Discharge Frequent or constant Supervision/Assistance  Patient can return home with the following Two people to help with walking and/or transfers;Two people to help with bathing/dressing/bathroom;Assist for transportation;Direct supervision/assist for medications management;Assistance with cooking/housework;Direct supervision/assist for financial management   Equipment Recommendations  None recommended by PT    Recommendations for Other Services        Precautions / Restrictions Precautions Precautions: Fall Restrictions Weight Bearing Restrictions: No     Mobility  Bed Mobility Overal bed mobility: Needs Assistance Bed Mobility: Supine to Sit     Supine to sit: Mod assist, +2 for physical assistance     General bed mobility comments: Assist needed to elevate trunk from supine position, able to get L leg off bed but needed R LE assistance due to pain    Transfers Overall transfer level: Needs assistance Equipment used: Bilateral platform walker (EVA walker) Transfers: Sit to/from Stand, Bed to chair/wheelchair/BSC Sit to Stand: Min assist, Mod assist   Step pivot transfers: Min assist, +2 physical assistance       General transfer comment: Min assist on 1st attempt from bed, mod A on 2nd attempt. MinA +2 during bed > chair transfer with tactile assist for fwd propulsion and backward guidance due to EVA walker wheel getting stuck on bari bed.    Ambulation/Gait                   Stairs             Wheelchair Mobility    Modified Rankin (Stroke Patients Only)       Balance Overall balance assessment: Needs assistance Sitting-balance support: No upper extremity supported, Feet supported Sitting balance-Leahy Scale: Good Sitting balance - Comments: Prolonged static sitting balance with no LOB, dynamic sitting balance while reaching forward and performing AROM of legs   Standing balance support: Bilateral upper extremity supported, During functional activity   Standing balance comment: relies on BUE and external support                            Cognition Arousal/Alertness: Awake/alert   Overall Cognitive  Status: Within Functional Limits for tasks assessed                                 General Comments: Pt able to demonstrate dual tasking and divided attention, AOx4        Exercises General Exercises - Lower Extremity Long Arc Quad: AROM, Both Hip  Flexion/Marching: AROM, Both    General Comments General comments (skin integrity, edema, etc.): SpO2 >92% on 3L HFNC with mobility, HR up to 126 bpm during mobility      Pertinent Vitals/Pain Pain Assessment Pain Assessment: Faces Faces Pain Scale: Hurts little more Pain Location: R knee Pain Descriptors / Indicators: Nagging Pain Intervention(s): Monitored during session    Home Living                          Prior Function            PT Goals (current goals can now be found in the care plan section) Acute Rehab PT Goals Patient Stated Goal: to be able to walk without AD PT Goal Formulation: With patient Time For Goal Achievement: 02/05/22 Potential to Achieve Goals: Good Progress towards PT goals: Progressing toward goals    Frequency    Min 3X/week      PT Plan Current plan remains appropriate    Co-evaluation              AM-PAC PT "6 Clicks" Mobility   Outcome Measure  Help needed turning from your back to your side while in a flat bed without using bedrails?: A Little Help needed moving from lying on your back to sitting on the side of a flat bed without using bedrails?: A Lot Help needed moving to and from a bed to a chair (including a wheelchair)?: A Lot Help needed standing up from a chair using your arms (e.g., wheelchair or bedside chair)?: A Lot Help needed to walk in hospital room?: Total Help needed climbing 3-5 steps with a railing? : Total 6 Click Score: 11    End of Session Equipment Utilized During Treatment: Oxygen;Gait belt Activity Tolerance: Patient tolerated treatment well Patient left: with call bell/phone within reach;in chair Nurse Communication: Mobility status;Other (comment) (oxygen status) PT Visit Diagnosis: Pain;Other abnormalities of gait and mobility (R26.89) Pain - Right/Left: Right Pain - part of body: Knee     Time: 0102-7253 PT Time Calculation (min) (ACUTE ONLY): 30 min  Charges:  $Therapeutic  Activity: 23-37 mins                     Janice Ryan, SPT    [place] Trouten 01/26/2022, 5:30 PM
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
## 2022-01-27 DIAGNOSIS — J9622 Acute and chronic respiratory failure with hypercapnia: Secondary | ICD-10-CM | POA: Diagnosis not present

## 2022-01-27 DIAGNOSIS — J9621 Acute and chronic respiratory failure with hypoxia: Secondary | ICD-10-CM | POA: Diagnosis not present

## 2022-01-27 LAB — BASIC METABOLIC PANEL
Anion gap: 8 (ref 5–15)
BUN: 26 mg/dL — ABNORMAL HIGH (ref 8–23)
CO2: 38 mmol/L — ABNORMAL HIGH (ref 22–32)
Calcium: 11 mg/dL — ABNORMAL HIGH (ref 8.9–10.3)
Chloride: 91 mmol/L — ABNORMAL LOW (ref 98–111)
Creatinine, Ser: 1.14 mg/dL — ABNORMAL HIGH (ref 0.44–1.00)
GFR, Estimated: 54 mL/min — ABNORMAL LOW (ref >60)
Glucose, Bld: 109 mg/dL — ABNORMAL HIGH (ref 70–99)
Potassium: 3.8 mmol/L (ref 3.5–5.1)
Sodium: 137 mmol/L (ref 135–145)

## 2022-01-27 MED ORDER — ENOXAPARIN SODIUM 100 MG/ML IJ SOSY
100.0000 mg | PREFILLED_SYRINGE | Freq: Every day | INTRAMUSCULAR | Status: DC
  Administered 2022-01-28 – 2022-01-29 (×2): 100 mg via SUBCUTANEOUS
  Filled 2022-01-27 (×3): qty 1

## 2022-01-27 MED ORDER — ORAL CARE MOUTH RINSE: 15.0000 mL | OROMUCOSAL | Status: DC | PRN

## 2022-01-27 NOTE — TOC Progression Note (Signed)
Transition of Care (TOC) - Progression Note  Marvetta Gibbons RN, BSN Transitions of Care Unit 4E- RN Case Manager See Treatment Team for direct phone #    Patient Details  Name: Janice Ryan MRN: [id number] Date of Birth: [birth year]  Transition of Care [facility]) CM/SW Contact  [name] [name], RN Phone Number: 01/27/2022, 12:50 PM  Clinical Narrative:    NIV order form has been signed for [employer] Romans and faxed back to Karen Kitchens to follow and submit for insurance auth.   NIV pending insurance approval for home  CM spoke with pt - discussed going home w/ [facility]- list provided for [facility] choice Per CMS guidelines from [email or web address] website with star ratings (copy placed in shadow chart) - pt to review with family- looking at HHPT/OT.  Family is assisting with equipment needs for home- other than home NIV- pt declining any other DME needs for discharge at this time.   At this time CM/CSW have no bed offers for SNF or AIR- and have discussed this with pt- only option at this time is to return home w/ [facility] when medically ready and home NIV has been approved.    Expected Discharge Plan: [place] Barriers to Discharge: Continued Medical Work up  Expected Discharge Plan and Services Expected Discharge Plan: [place] In-house Referral: Clinical Social Work   Post Acute Care Choice: [place] Living arrangements for the past 2 months: Apartment                                       Social Determinants of Health (SDOH) Interventions    Readmission Risk Interventions     No data to display

## 2022-01-27 NOTE — Progress Notes (Signed)
Occupational Therapy Treatment Patient Details Name: Shallen Luedke MRN: [id number] DOB: Apr 08, 1955 Today's Date: 01/27/2022   History of present illness Patient is a 66 y/o female who presents on 01/20/22 with fall at home. Admitted with acute on chronic hypercarbic respiratory failure in the setting of narcotic administration secondary to knee pain. Required BiPAP and not HFNC. PMH includes morbid obesity, asthma, anxiety, chronic right knee pain (reports torn ACL inr ight knee which is recent and needs surgery).   OT comments  Patient continues to make steady progress towards goals in skilled OT session. Patient's session encompassed functional transfers, ADLs, and improving activity tolerance. Patient motivated to transfer to chair to each lunch, (min A of 2 for safety) however deferring functional mobility due to family coming to see her. Recommendation remains appropriate, OT will continue to follow.    Recommendations for follow up therapy are one component of a multi-disciplinary discharge planning process, led by the attending physician.  Recommendations may be updated based on patient status, additional functional criteria and insurance authorization.    Follow Up Recommendations  Acute inpatient rehab (3hours/day)    Assistance Recommended at Discharge Frequent or constant Supervision/Assistance  Patient can return home with the following  Two people to help with walking and/or transfers;Two people to help with bathing/dressing/bathroom;Assist for transportation;Help with stairs or ramp for entrance;Assistance with cooking/housework   Equipment Recommendations  Other (comment) (defer to next venue)    Recommendations for Other Services      Precautions / Restrictions Precautions Precautions: Fall Precaution Comments: watch O2 Restrictions Weight Bearing Restrictions: No       Mobility Bed Mobility Overal bed mobility: Needs Assistance Bed Mobility: Supine to  Sit     Supine to sit: Min assist, +2 for physical assistance, +2 for safety/equipment     General bed mobility comments: Assist needed to elevate trunk from supine position, able to get L leg off bed but needed R LE assistance due to pain    Transfers Overall transfer level: Needs assistance Equipment used: Bilateral platform walker (EVA walker) Transfers: Sit to/from Stand, Bed to chair/wheelchair/BSC Sit to Stand: Min assist     Step pivot transfers: Min assist, +2 physical assistance     General transfer comment: Min A for transfers, cues for safety and positioning as patient tends to rush with transfers, however receptive to cues from therapist     Balance Overall balance assessment: Needs assistance Sitting-balance support: No upper extremity supported, Feet supported Sitting balance-Leahy Scale: Good     Standing balance support: Bilateral upper extremity supported, During functional activity Standing balance-Leahy Scale: Fair Standing balance comment: relies on BUE and external support                           ADL either performed or assessed with clinical judgement   ADL Overall ADL's : Needs assistance/impaired Eating/Feeding: Independent;Sitting   Grooming: Set up;Sitting;Wash/dry face                   Toilet Transfer: Minimal assistance;+2 for physical assistance;+2 for safety/equipment;Cueing for sequencing;Cueing for safety Toilet Transfer Details (indicate cue type and reason): use of EVA walker to complete stand pivot to recliner, +2 for safety only         Functional mobility during ADLs: Minimal assistance;+2 for physical assistance;+2 for safety/equipment General ADL Comments: Session focus on ADLs, functional transfers to increase activity tolerance    Extremity/Trunk Assessment  Vision       Perception     Praxis      Cognition Arousal/Alertness: Awake/alert Behavior During Therapy: WFL for tasks  assessed/performed Overall Cognitive Status: Within Functional Limits for tasks assessed                                 General Comments: Jovial and motivated in session with mobility specialist        Exercises      Shoulder Instructions       General Comments      Pertinent Vitals/ Pain       Pain Assessment Pain Assessment: Faces Faces Pain Scale: Hurts a little bit Pain Location: R knee Pain Descriptors / Indicators: Nagging Pain Intervention(s): Limited activity within patient's tolerance, Monitored during session, Repositioned  Home Living                                          Prior Functioning/Environment              Frequency  Min 2X/week        Progress Toward Goals  OT Goals(current goals can now be found in the care plan section)  Progress towards OT goals: Progressing toward goals  Acute Rehab OT Goals Patient Stated Goal: to eat my lunch and see my family OT Goal Formulation: With patient Time For Goal Achievement: 02/05/22 Potential to Achieve Goals: Good  Plan Discharge plan remains appropriate    Co-evaluation                 AM-PAC OT "6 Clicks" Daily Activity     Outcome Measure   Help from another person eating meals?: None Help from another person taking care of personal grooming?: A Little Help from another person toileting, which includes using toliet, bedpan, or urinal?: A Lot Help from another person bathing (including washing, rinsing, drying)?: A Lot Help from another person to put on and taking off regular upper body clothing?: A Lot Help from another person to put on and taking off regular lower body clothing?: Total 6 Click Score: 14    End of Session Equipment Utilized During Treatment: Oxygen  OT Visit Diagnosis: Other abnormalities of gait and mobility (R26.89);Muscle weakness (generalized) (M62.81);Other symptoms and signs involving cognitive function;Pain Pain -  Right/Left: Right Pain - part of body: Knee;Leg   Activity Tolerance Patient tolerated treatment well   Patient Left in chair;with call bell/phone within reach   Nurse Communication Mobility status        Time: 0349-1791 OT Time Calculation (min): 15 min  Charges: OT General Charges $OT Visit: 1 Visit OT Treatments $Self Care/Home Management : 8-22 mins  Corinne Ports E. Alfonza Toft, OTR/L Acute Rehabilitation Services [phone]   Ascencion Dike 01/27/2022, 1:33 PM

## 2022-01-27 NOTE — Progress Notes (Signed)
Occupational Therapy Treatment Patient Details Name: Janice Ryan MRN: [id number] DOB: 1955/11/13 Today's Date: 01/27/2022   History of present illness Patient is a 66 y/o female who presents on 01/20/22 with fall at home. Admitted with acute on chronic hypercarbic respiratory failure in the setting of narcotic administration secondary to knee pain. Required BiPAP and not HFNC. PMH includes morbid obesity, asthma, anxiety, chronic right knee pain (reports torn ACL inr ight knee which is recent and needs surgery).   OT comments  Patient seen BID in order to return back to bed and complete toileting. Patient working with OT and mobility specialist using EVA walker to ambulate to bathroom (forward posture noted throughout and dependence on BUE support from EVA walker). Patient total A for peri-care, however min A for toilet transfers (grab bars). Patient on 3L throughout with good o2 sats, noted HR to 120 at end of session after ambulation back to bed. OT recommendation remains appropriate, will continue to follow.    Recommendations for follow up therapy are one component of a multi-disciplinary discharge planning process, led by the attending physician.  Recommendations may be updated based on patient status, additional functional criteria and insurance authorization.    Follow Up Recommendations  Acute inpatient rehab (3hours/day)    Assistance Recommended at Discharge Frequent or constant Supervision/Assistance  Patient can return home with the following  Two people to help with walking and/or transfers;Two people to help with bathing/dressing/bathroom;Assist for transportation;Help with stairs or ramp for entrance;Assistance with cooking/housework   Equipment Recommendations  Other (comment) (defer to next venue)    Recommendations for Other Services      Precautions / Restrictions Precautions Precautions: Fall Precaution Comments: watch O2 Restrictions Weight Bearing  Restrictions: No       Mobility Bed Mobility Overal bed mobility: Needs Assistance Bed Mobility: Sit to Supine     Supine to sit: Min assist, +2 for physical assistance, +2 for safety/equipment Sit to supine: Mod assist, +2 for physical assistance   General bed mobility comments: Assist to bring BLEs back into bed    Transfers Overall transfer level: Needs assistance Equipment used: Bilateral platform walker (EVA) Transfers: Sit to/from Stand, Bed to chair/wheelchair/BSC Sit to Stand: Min assist     Step pivot transfers: Min assist, +2 physical assistance     General transfer comment: Min A for transfers, cues for safety and positioning as patient tends to rush with transfers, however receptive to cues from therapist     Balance Overall balance assessment: Needs assistance Sitting-balance support: No upper extremity supported, Feet supported Sitting balance-Leahy Scale: Good     Standing balance support: Bilateral upper extremity supported, During functional activity Standing balance-Leahy Scale: Fair Standing balance comment: relies on BUE and external support                           ADL either performed or assessed with clinical judgement   ADL Overall ADL's : Needs assistance/impaired Eating/Feeding: Independent;Sitting   Grooming: Set up;Sitting;Wash/dry face                   Toilet Transfer: Minimal assistance;+2 for physical assistance;+2 for safety/equipment;Cueing for sequencing;Cueing for safety Toilet Transfer Details (indicate cue type and reason): use of EVA walker to ambulate to bathroom with bariatric BSC frame over top Toileting- Clothing Manipulation and Hygiene: Total assistance;+2 for physical assistance;+2 for safety/equipment;Sit to/from stand Toileting - Clothing Manipulation Details (indicate cue type and reason): able to stand  and lean forward for OT to complete peri care     Functional mobility during ADLs: Minimal  assistance;+2 for physical assistance;+2 for safety/equipment General ADL Comments: Session focus on ADLs, functional transfers to increase activity tolerance    Extremity/Trunk Assessment              Vision       Perception     Praxis      Cognition Arousal/Alertness: Awake/alert Behavior During Therapy: WFL for tasks assessed/performed Overall Cognitive Status: Within Functional Limits for tasks assessed                                 General Comments: Jovial and motivated in session with mobility specialist        Exercises      Shoulder Instructions       General Comments      Pertinent Vitals/ Pain       Pain Assessment Pain Assessment: Faces Faces Pain Scale: Hurts a little bit Pain Location: R knee Pain Descriptors / Indicators: Nagging Pain Intervention(s): Limited activity within patient's tolerance, Monitored during session, Repositioned  Home Living                                          Prior Functioning/Environment              Frequency  Min 2X/week        Progress Toward Goals  OT Goals(current goals can now be found in the care plan section)  Progress towards OT goals: Progressing toward goals  Acute Rehab OT Goals Patient Stated Goal: to get better OT Goal Formulation: With patient Time For Goal Achievement: 02/05/22 Potential to Achieve Goals: Good  Plan Discharge plan remains appropriate    Co-evaluation                 AM-PAC OT "6 Clicks" Daily Activity     Outcome Measure   Help from another person eating meals?: None Help from another person taking care of personal grooming?: A Little Help from another person toileting, which includes using toliet, bedpan, or urinal?: A Lot Help from another person bathing (including washing, rinsing, drying)?: A Lot Help from another person to put on and taking off regular upper body clothing?: A Lot Help from another person to put on  and taking off regular lower body clothing?: Total 6 Click Score: 14    End of Session Equipment Utilized During Treatment: Oxygen;Other (comment) (EVA walker)  OT Visit Diagnosis: Other abnormalities of gait and mobility (R26.89);Muscle weakness (generalized) (M62.81);Other symptoms and signs involving cognitive function;Pain Pain - Right/Left: Right Pain - part of body: Knee;Leg   Activity Tolerance Patient tolerated treatment well   Patient Left in bed;with call bell/phone within reach;with family/visitor present   Nurse Communication Mobility status        Time: 1610-9604 OT Time Calculation (min): 26 min  Charges: OT General Charges $OT Visit: 1 Visit OT Treatments $Self Care/Home Management : 23-37 mins  [place]. Muaz Shorey, OTR/L Acute Rehabilitation Services [phone]   Ascencion Dike 01/27/2022, 3:48 PM

## 2022-01-27 NOTE — Progress Notes (Signed)
FMTS Interim Progress Note  Patient is a good candidate for NIPPV therapy.  The patient has chronic respiratory failure secondary to severe OHS, severe and life-threatening disease state.  NIPPV is essential therapy for the patient's chronic conditions since modes on the machine cannot accommodate a dual prescription and is able to measure the volume and pressures accurately.  Close monitoring with the NIV will be most efficient to maintain the patient's respiratory status and avoid future hospital stays.  Due to this patient's severe OHS/CRF this patient will be placed on NIV for nocturnal use and as needed daytime use.  Also, patient will benefit from mouthpiece ventilation for as needed daytime use.  BiPAP, BiPAP ST and CPAP do not have these capabilities and have been ruled out.  Due to the severity of the patient's condition, the absence of NIPPV can increase patient's PCO2 and could result in hospital readmission or possible death.  Signed form for home NIV needs and given to case manager Marvetta Gibbons.  August Albino, MD 01/27/2022, 9:49 AM PGY-1, [facility] pager [phone]

## 2022-01-27 NOTE — Progress Notes (Signed)
FMTS Interim Progress Note  Spoke with patient's spouse Janice Ryan to provide update. He states his personal preference is for the patient to return home rather than going to SNF/inpt rehab. States his main concern is getting the MRI of her knee before doing more physical therapy. Discussed that ultimately will depend on patient's preference and bed availability.  Also, states that if patient is going home, he would only be able to come pick her up on Sunday 10/29. States patient wouldn't have other means of getting home.  August Albino, MD 01/27/2022, 12:09 PM PGY-1, [facility] pager [phone]
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
## 2022-01-28 DIAGNOSIS — J9622 Acute and chronic respiratory failure with hypercapnia: Secondary | ICD-10-CM | POA: Diagnosis not present

## 2022-01-28 DIAGNOSIS — J9621 Acute and chronic respiratory failure with hypoxia: Secondary | ICD-10-CM | POA: Diagnosis not present

## 2022-01-28 LAB — BASIC METABOLIC PANEL
Anion gap: 10 (ref 5–15)
BUN: 21 mg/dL (ref 8–23)
CO2: 36 mmol/L — ABNORMAL HIGH (ref 22–32)
Calcium: 10.9 mg/dL — ABNORMAL HIGH (ref 8.9–10.3)
Chloride: 92 mmol/L — ABNORMAL LOW (ref 98–111)
Creatinine, Ser: 1.04 mg/dL — ABNORMAL HIGH (ref 0.44–1.00)
GFR, Estimated: >60 mL/min (ref >60)
Glucose, Bld: 116 mg/dL — ABNORMAL HIGH (ref 70–99)
Potassium: 4.3 mmol/L (ref 3.5–5.1)
Sodium: 138 mmol/L (ref 135–145)

## 2022-01-28 MED ORDER — TRAMADOL HCL 50 MG PO TABS
100.0000 mg | ORAL_TABLET | Freq: Two times a day (BID) | ORAL | Status: DC | PRN
  Administered 2022-01-28 – 2022-01-29 (×3): 100 mg via ORAL
  Filled 2022-01-28 (×3): qty 2

## 2022-01-28 MED ORDER — POLYVINYL ALCOHOL 1.4 % OP SOLN: 2.0000 [drp] | OPHTHALMIC | Status: DC | PRN

## 2022-01-28 NOTE — TOC Progression Note (Signed)
Transition of Care [facility]) - Progression Note    Patient Details  Name: Janice Ryan MRN: [id number] Date of Birth: 03-14-1956  Transition of [facility]) CM/SW Contact  Graves-Bigelow, Janice Cornfield, RN Phone Number: 01/28/2022, 3:55 PM  Clinical Narrative:  Case Manager received a secure chat from the MD-asking if the patient will be able to transition home on Sunday with NIV setup. Per MD, Spouse can only transport the patient home on Sunday and Thursday. Patient is wheelchair bound and needs wheelchair accessible transportation. Case Manager called [facility]ue[facility] Romans to see if respiratory can do the NIV education and set up on Sunday. [employer] Liaison Janice Ryan is working to find the respiratory therapist on call to see if the above can be completed on Sunday. Huey Romans has to discuss co pays with the patient and spouse prior to discharge. Covering Case Manager to continue to follow for transition of care needs. Case Manager reviewed previous Case Managers notes and the patient wants [facility] services. First choice is [facility], 2nd choice is [facility]: Janice Ryan accepted the patient for PT/OT; however, patient may have a co pay. Janice Ryan will call the patient at home to discuss co pay if she has one. Case Manager did discuss the above information with the patient. No further needs identified at this time.        Final Next Level of Care: Home with [place]  Expected Discharge Plan: [place] Barriers to Discharge: Continued Medical Work up  Expected Discharge Plan and Services Expected Discharge Plan: [place]  In-house Referral: Clinical Social Work   Post Acute Care Choice: [place] Living arrangements for the past 2 months: Apartment                  Readmission Risk Interventions     No data to display

## 2022-01-28 NOTE — Plan of Care (Signed)
  Problem: Education: Goal: Knowledge of General Education information will improve Description: Including pain rating scale, medication(s)/side effects and non-pharmacologic comfort measures Outcome: Progressing   Problem: Clinical Measurements: Goal: Ability to maintain clinical measurements within normal limits will improve Outcome: Progressing Goal: Will remain free from infection Outcome: Progressing Goal: Diagnostic test results will improve Outcome: Progressing Goal: Respiratory complications will improve Outcome: Progressing Goal: Cardiovascular complication will be avoided Outcome: Progressing   Problem: Elimination: Goal: Will not experience complications related to bowel motility Outcome: Progressing Goal: Will not experience complications related to urinary retention Outcome: Progressing   Problem: Coping: Goal: Level of anxiety will decrease Outcome: Progressing   Problem: Safety: Goal: Ability to remain free from injury will improve Outcome: Progressing   Problem: Skin Integrity: Goal: Risk for impaired skin integrity will decrease Outcome: Progressing

## 2022-01-28 NOTE — Discharge Summary (Signed)
Family Medicine Teaching [facility] Discharge Summary  Patient name: Janice Ryan Medical record number: [id number] Date of birth: 19-Oct-1955 Age: 66 y.o. Gender: female Date of Admission: 01/20/2022  Date of Discharge: 01/29/22 Admitting Physician: Westley Chandler, MD  Primary Care Provider: [name], NP Consultants: None  Indication for Hospitalization: Altered mental status, respiratory distress  Principal Problem for Admission: Acute on chronic hypoxemic hypercapnic respiratory failure  Discharge Diagnoses/Problem List:  Principal Problem:   Acute on chronic respiratory failure with hypoxia and hypercapnia (HCC) Active Problems:   AKI (acute kidney injury) (HCC)   Major depressive disorder, recurrent episode, mild (HCC)   HTN (hypertension)   Fall at home   Poor social situation   Pneumonia due to infectious organism   Hypercalcemia  Brief Hospital Course:  Janice Ryan is a 66 y.o.female with a history of OSA, suspected OHS on home oxygen, obesity, asthma who was admitted to the family medicine teaching Service at [facility] for acute respiratory failure. Her hospital course is detailed below:  Acute on chronic respiratory failure with hypoxia and hypercapnia Concern for pulmonary edema versus pneumonia given CXR findings, but reassuringly was afebrile without leukocytosis or productive cough.  Received empiric azithromycin and ceftriaxone.  Required BiPAP on admission but eventually was weaned to home 3 L O2 on [facility].  Continue to use BiPAP at night.    Was initially intended to go to SNF or inpatient rehab but was unable to find placement due to elevated BMI.  Was discharged home with home health, sent home with NIV Trilogy*** machine.  Continued on LABA + ICS, as needed SABA nebulizer.  AKI AKI noted after starting home lisinopril, also received a dose of Lasix on the same day.  Both of these were discontinued.  Blood pressure remained stable.   Recommend recheck BMP outpatient before starting meds.  Fall at home Right knee pain from fall after slipping on carpet.  Negative for fractures on XR.  Recommend outpatient MRI for evaluation of potential ACL tear.  Received Tylenol, home tramadol for pain.  Hypercalcemia Noted to have mildly elevated calcium.  Vitamin D low, iCal mildly elevated 6.0, PTH normal.  Asymptomatic, no concerning findings on EKG.  Recommend outpatient follow-up.  Hypertension Held lisinopril and Lasix as above.  Other chronic conditions were medically managed with home medications and formulary alternatives as necessary   PCP Follow-up Recommendations: Ensure adherence/tolerance with BiPAP (Trilogy), home oxygen Recommend recheck BMP before restarting lisinopril Recommend outpatient MRI to evaluate right knee Consider further evaluation of hypercalcemia with PCP.  Disposition: Home  Discharge Condition: Stable  Discharge Exam:  Vitals:   01/28/22 1625 01/28/22 2052  BP: 132/62 121/69  Pulse: 79 78  Resp: (!) 22 17  Temp: 98.4 F (36.9 C) 98.3 F (36.8 C)  SpO2: 94% 93%   ***  Significant Procedures: None  Significant Labs and Imaging:  No results for input(s): "WBC", "HGB", "HCT", "PLT" in the last 48 hours. Recent Labs  Lab 01/27/22 0210 01/28/22 0058  NA 137 138  K 3.8 4.3  CL 91* 92*  CO2 38* 36*  GLUCOSE 109* 116*  BUN 26* 21  CREATININE 1.14* 1.04*  CALCIUM 11.0* 10.9*   Pertinent imaging  CXR 10/23 IMPRESSION: 1. Cardiomegaly with increased bilateral perihilar opacities, which may reflect pulmonary edema. 2. Small right lateral pleural effusion.  CXR 10/21 IMPRESSION: 1. Cardiomegaly with increased bilateral perihilar opacities, which may reflect pulmonary edema. 2. Small right lateral pleural effusion.  Right knee  x-ray 10/20 IMPRESSION: 1. Cardiomegaly with increased bilateral perihilar opacities, which may reflect pulmonary edema. 2. Small right lateral  pleural effusion.  Hip x-ray bilateral 10/20 IMPRESSION: 1. Cardiomegaly with increased bilateral perihilar opacities, which may reflect pulmonary edema. 2. Small right lateral pleural effusion.  Results/Tests Pending at Time of Discharge: None  Discharge Medications:  Allergies as of 01/28/2022       Reactions   Neurontin [gabapentin] Other (See Comments)   Has un-controlled movement of her body, excessive dribble of saliva at night   Sulfa Antibiotics Nausea And Vomiting, Other (See Comments)   Stomach pain     Med Rec must be completed prior to using this [facility]***        Durable Medical Equipment  (From admission, onward)           Start     Ordered   01/24/22 1507  For home use only DME oxygen  Once       Question Answer Comment  Length of Need Lifetime   Liters per Minute 2   Frequency Continuous (stationary and portable oxygen unit needed)   Oxygen delivery system Gas      01/24/22 1517            Discharge Instructions: Please refer to Patient Instructions section of EMR for full details.  Patient was counseled important signs and symptoms that should prompt return to medical care, changes in medications, dietary instructions, activity restrictions, and follow up appointments.   Follow-Up Appointments:  [place] Follow up.   Why: Home NIV- pending insurance approval Contact information: [place] [address] [phone]         Care, [facility] Follow up.   Specialty: Home Health Services Why: Physical Therapy-Occupational Therapy-Office to call with visit times. Contact information: [place] [place] [address] [phone]                 Ezequiel Essex, MD 01/28/2022, 11:06 PM PGY-3, [place]
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
## 2022-01-28 NOTE — Progress Notes (Signed)
Patient has selected the [facility] agency, 1st, advanced home care, 2nd [place], 3rd [facility], 4th [facility]

## 2022-01-28 NOTE — Plan of Care (Signed)
  Problem: Clinical Measurements: Goal: Respiratory complications will improve Outcome: Progressing   

## 2022-01-29 DIAGNOSIS — J9622 Acute and chronic respiratory failure with hypercapnia: Secondary | ICD-10-CM | POA: Diagnosis not present

## 2022-01-29 DIAGNOSIS — J9621 Acute and chronic respiratory failure with hypoxia: Secondary | ICD-10-CM | POA: Diagnosis not present

## 2022-01-29 LAB — BASIC METABOLIC PANEL
Anion gap: 8 (ref 5–15)
BUN: 18 mg/dL (ref 8–23)
CO2: 34 mmol/L — ABNORMAL HIGH (ref 22–32)
Calcium: 10.6 mg/dL — ABNORMAL HIGH (ref 8.9–10.3)
Chloride: 95 mmol/L — ABNORMAL LOW (ref 98–111)
Creatinine, Ser: 1.04 mg/dL — ABNORMAL HIGH (ref 0.44–1.00)
GFR, Estimated: >60 mL/min (ref >60)
Glucose, Bld: 108 mg/dL — ABNORMAL HIGH (ref 70–99)
Potassium: 4.1 mmol/L (ref 3.5–5.1)
Sodium: 137 mmol/L (ref 135–145)

## 2022-01-29 LAB — CBC
HCT: 35.6 % — ABNORMAL LOW (ref 36.0–46.0)
Hemoglobin: 11.1 g/dL — ABNORMAL LOW (ref 12.0–15.0)
MCH: 29.1 pg (ref 26.0–34.0)
MCHC: 31.2 g/dL (ref 30.0–36.0)
MCV: 93.4 fL (ref 80.0–100.0)
Platelets: 217 10*3/uL (ref 150–400)
RBC: 3.81 MIL/uL — ABNORMAL LOW (ref 3.87–5.11)
RDW: 12.3 % (ref 11.5–15.5)
WBC: 8.1 10*3/uL (ref 4.0–10.5)
nRBC: 0 % (ref 0.0–0.2)

## 2022-01-29 MED ORDER — FLUTICASONE PROPIONATE 50 MCG/ACT NA SUSP: 2.0000 | Freq: Every day | NASAL | 2 refills | Status: DC

## 2022-01-29 NOTE — Plan of Care (Signed)
  Problem: Education: Goal: Knowledge of General Education information will improve Description: Including pain rating scale, medication(s)/side effects and non-pharmacologic comfort measures Outcome: Adequate for Discharge   Problem: Health Behavior/Discharge Planning: Goal: Ability to manage health-related needs will improve Outcome: Adequate for Discharge   Problem: Clinical Measurements: Goal: Ability to maintain clinical measurements within normal limits will improve Outcome: Adequate for Discharge Goal: Will remain free from infection Outcome: Adequate for Discharge Goal: Diagnostic test results will improve Outcome: Adequate for Discharge Goal: Respiratory complications will improve Outcome: Adequate for Discharge Goal: Cardiovascular complication will be avoided Outcome: Adequate for Discharge   Problem: Activity: Goal: Risk for activity intolerance will decrease Outcome: Adequate for Discharge   Problem: Nutrition: Goal: Adequate nutrition will be maintained Outcome: Adequate for Discharge   Problem: Skin Integrity: Goal: Risk for impaired skin integrity will decrease Outcome: Adequate for Discharge   Problem: Pain Managment: Goal: General experience of comfort will improve Outcome: Adequate for Discharge   Problem: Elimination: Goal: Will not experience complications related to bowel motility Outcome: Adequate for Discharge Goal: Will not experience complications related to urinary retention Outcome: Adequate for Discharge

## 2022-01-29 NOTE — Plan of Care (Signed)
  Problem: Clinical Measurements: Goal: Respiratory complications will improve Outcome: Progressing   

## 2022-01-29 NOTE — Discharge Instructions (Addendum)
Dear Janice Ryan,  Thank you for letting us participate in your care. You were hospitalized for Acute on chronic respiratory failure with hypoxia and hypercapnia ([place]).   POST-HOSPITAL & CARE INSTRUCTIONS Your blood pressure medicine lisinopril was stopped in the hospital. Do not take this unless instructed by your doctor.  Reduce the amount of sedating medications you take. Talk with your doctor.  Do NOT take medications to induce sleep.  Do NOT take NSAIDs (antiinflammatory medications like ibuprofen) Take as little pain medications as possible. Use your BiPAP every night.  See your new primary care doctor at your follow up appointments.  See your new pulmonologist at your appointment 11/02. Go to your follow up appointments (listed below)   DOCTOR'S APPOINTMENT   Future Appointments  Date Time Provider [place]  02/02/2022 11:10 AM Zola Button, MD [facility]  02/02/2022  1:30 PM Laurin Coder, MD [facility] None  02/16/2022 11:10 AM Pray, Norwood Levo, MD [facility] [place]    Follow-up Information     [place] Follow up.   Why: Home NIV- pending insurance approval Contact information: [place] [address] [phone]         Care, [facility] Follow up.   Specialty: Home Health Services Why: Physical Therapy-Occupational Therapy-Office to call with visit times. Contact information: [place] [address] [phone]         Zola Button, MD. Go on 02/02/2022.   Specialty: Family Medicine Why: At 11:10 am.  Please arrive by 10:55 am for check-in.  This is your hospital follow-up appointment with your new clinic, the [facility] family medicine clinic.  You will see [name] today.  Please note, this is the same day as your pulmonology appointment, which is at 1:30 PM. Contact information: [place] [address] [phone]         Laurin Coder,  MD. Go on 02/02/2022.   Specialty: Pulmonary Disease Why: At 1:30 PM.  Please arrive by 1:15 PM to check-in.  This is your pulmonology appointment.  You were referred by your previous PCP.  Please note, this is the same day as your hospital follow-up appointment with the family medicine clinic. Contact information: [address] [place] [address] [phone]         Lenoria Chime, MD. Go on 02/16/2022.   Specialty: Family Medicine Why: At 11:10 AM.  Please arrive by 10:55 AM for check-in.  This is your new patient appointment to officially establish with Dr. Thompson Grayer. Contact information: [address] [place] [address] [phone]               Take care and be well!  [facility]  [place], [place] [address] [phone]

## 2022-01-29 NOTE — TOC Transition Note (Signed)
Transition of Care [facility]) - CM/SW Discharge Note   Patient Details  Name: Maeola Mchaney MRN: [id number] Date of Birth: [birth year]  Transition of Care [facility]) CM/SW Contact:  Gayla Medicus., RN Phone Number: 01/29/2022, 8:48 AM  Clinical Narrative:     RNCM spoke with [name] at [place] and everything is in place for discharge home today.   Final next level of care: [place] Barriers to Discharge: Continued Medical Work up  Patient Goals and CMS Choice Patient states their goals for this hospitalization and ongoing recovery are:: "I need to go to rehab to work on my knee" CMS Medicare.gov Compare Post Acute Care list provided to:: Patient Choice offered to / list presented to : Patient Discharge Placement                      Discharge Plan and Services In-house Referral: Clinical Social Work   Post Acute Care Choice: [place]                    HH Arranged: PT, OT [facility] Agency: [place] Date [place]: 01/28/22 Time [place]: [id number] Representative spoke with at [place]: Tommi Rumps  Social Determinants of Health ([place]) Interventions    Readmission Risk Interventions     No data to display

## 2022-02-01 ENCOUNTER — Telehealth (INDEPENDENT_AMBULATORY_CARE_PROVIDER_SITE_OTHER): Payer: BLUE CROSS/BLUE SHIELD | Admitting: Student

## 2022-02-01 ENCOUNTER — Telehealth: Payer: Self-pay

## 2022-02-01 DIAGNOSIS — R0989 Other specified symptoms and signs involving the circulatory and respiratory systems: Secondary | ICD-10-CM | POA: Diagnosis not present

## 2022-02-01 MED ORDER — BENZONATATE 100 MG PO CAPS: 100.0000 mg | ORAL_CAPSULE | Freq: Two times a day (BID) | ORAL | 0 refills | Status: AC | PRN

## 2022-02-01 MED ORDER — OSELTAMIVIR PHOSPHATE 75 MG PO CAPS: 75.0000 mg | ORAL_CAPSULE | Freq: Two times a day (BID) | ORAL | 0 refills | Status: AC

## 2022-02-01 NOTE — Telephone Encounter (Signed)
Patient calls nurse line requesting virtual appointment for sick symptoms. She reports cough, congestion, body aches and fever. Tmax 100.9. Home COVID testing was negative.   She is requesting antibiotic. Advised that she would need to be evaluated prior to abx being prescribed. Spoke with Dr. Jinny Sanders regarding patient. Advised that patient could be scheduled virtually.   Scheduled patient for this AM.   Talbot Grumbling, RN

## 2022-02-01 NOTE — Progress Notes (Cosign Needed)
    SUBJECTIVE:   VIRTUAL VISIT CHIEF COMPLAINT / HPI: Cough  Recent admission for acute on chronic respiratory failure in setting of narcotic administration for knee pain. Concern for PNA vs pulmonary edema but was afebrile during admission. Echo with hyperdynamic function >75%, unable to evaluate diastolic fn. Husband sick-tested positive with flu at urgent care, and son sick as well. Monday she started getting sick and her cough started. Chest congestion, nose congestion, body aches and pain, spitting up phlegm more than her normal this morning. Took a home Covid test and this was negative. Has been using 3L of oxygen since being home and has not had to go up on her oxygen. No blood in sputum.  Yesterday felt hot, had a fever to 100.98 this morning, chest hurting to talk to me. Chest feels tightness but no pain. Has not taken her inhalers yet. Still pretty much in bed. She is most concerned about her cough.   PERTINENT  PMH / PSH: OSA, suspected OHS on home O2 at 3 L  OBJECTIVE:   There were no vitals taken for this visit. Virtual visit. O2 at home on her pulse ox is 94% on 3L Phone visit, unable to see patient on video as she could not figure this out  ASSESSMENT/PLAN:   Suspected novel influenza A virus infection Husband with positive influenza test. Son also sick. Most likely cough and congestion in setting of flu. She is saturating well on her home oxygen and using her BiPAP at night. PNA possible as well given fever however speaking full sentences on phone without dyspnea. Discussed she will need in person evaluation as well.  -Tamiflu 75 mg BID for 5 days -Tessalon perrles prn -Follow up tomorrow with [name] to establish care in [facility] and see how she is doing   Gerrit Heck, [place]

## 2022-02-01 NOTE — Assessment & Plan Note (Signed)
Husband with positive influenza test. Son also sick. Most likely cough and congestion in setting of flu. She is saturating well on her home oxygen and using her BiPAP at night. PNA possible as well given fever however speaking full sentences on phone without dyspnea. Discussed she will need in person evaluation as well.  -Tamiflu 75 mg BID for 5 days -Tessalon perrles prn -Follow up tomorrow with [name] to establish care in [facility] and see how she is doing

## 2022-02-02 ENCOUNTER — Ambulatory Visit: Payer: Self-pay | Admitting: Family Medicine

## 2022-02-02 ENCOUNTER — Telehealth: Payer: Self-pay | Admitting: Family Medicine

## 2022-02-02 ENCOUNTER — Institutional Professional Consult (permissible substitution): Payer: Self-pay | Admitting: Pulmonary Disease

## 2022-02-02 NOTE — Telephone Encounter (Signed)
Patient returns call to nurse line. She reports that she feels like she is developing pneumonia again in her left lung. She states that she has been coughing up large amounts of phlegm with small traces of blood. She has been taking inhalers and albuterol nebulizer treatments as needed.   She states, "I really need an antibiotic to clear this up. I don't think I have the flu, this is worse than that."   She canceled her appointment for today with [name] due to her feeling "too bad to get out of the bed."  Provided with ED precautions.   Please advise how patient should proceed.   Talbot Grumbling, RN

## 2022-02-02 NOTE — Telephone Encounter (Signed)
Called patient and confirmed speaking to patient. Discussed that she continues to have productive cough and some blood tinged flecks in it. She is on her home oxygen and not needing more but turned it up to 5L per her home health RN due to comfort. She notes that they are coming out again today to adjust mask. She is requesting an antibiotic and shares a story of her previous provider prescribing antibiotics over the phone. I explained that we gave her Tamiflu yesterday as her husband is flu positive and we wanted to evaluate her today and she cancelled appt. She notes she is unable to come today or get out of bed. I recommended calling 911 to be evaluated and she states she does not want to go back to the hospital right now. I discussed strict return precautions and re-iterated I think she needs to be evaluated by a provider, which she declines.   I also set expectation that we will not be able to provide care for her virtually, and will not be prescribing antibiotics over the phone without evaluation. I discussed this would be poor care and why this is the standard at our practice. I again discussed she needs to start the Tamiflu, her husband is going to pick it up today. Re-iterated return precautions and why it would be poor care to prescribe an antibiotic at this time. She also mentioned "only getting a few antibiotics in hospital" and I explained she was treated for a full course of pneumonia.  Answered all questions and concerns.  Yehuda Savannah MD

## 2022-02-02 NOTE — Telephone Encounter (Signed)
Attempted to return call to patient. She did not answer, LVM asking her to return call to office.   Talbot Grumbling, RN

## 2022-02-03 ENCOUNTER — Ambulatory Visit: Payer: Self-pay | Admitting: *Deleted

## 2022-02-03 NOTE — Telephone Encounter (Addendum)
Pt called in to report symptoms not better.   Line either disconnected or she hung up.    I called her back left a voicemail to call back to [place] (that's the line she called in on though had video visit with [name], NP regarding present symptoms on 01/10/2022 [facility]).

## 2022-02-03 NOTE — Telephone Encounter (Signed)
  Chief Complaint: out of hosp feels like needing more antibiotics Symptoms: coughing up brown sputum Frequency: dc from hosp had pnuemonia Pertinent Negatives: Patient denies fever Disposition: [] ED /[] Urgent Care (no appt availability in office) / [x] Appointment(In office/virtual)/ []  [place] [facility]/ [] Home Care/ [] Refused Recommended Disposition /[] [place] [facility]/ []  Follow-up with PCP Additional Notes: Pt has hosp follow up appt 02/16/22  but also feels like she needs antibiotics sent in to continue one more round, feels symptoms coming back, requesting Erythromycin and Amoxicillin.   Answer Assessment - Initial Assessment Questions 1. INFECTION: "What infection is the antibiotic being given for?"     Just out of hosp from pna, out of antibiotics and still sick 2. ANTIBIOTIC: "What antibiotic are you taking" "How many times per day?"     Erythromycin and amoxicillin 3. DURATION: "When was the antibiotic started?"     In hosp 4. MAIN CONCERN OR SYMPTOM:  "What is your main concern right now?"     Symptoms coming back, voice sounds hoarse 5. BETTER-SAME-WORSE: "Are you getting better, staying the same, or getting worse compared to when you first started the antibiotics?" If getting worse, ask: "In what way?"      Better but does not want to go backwards 6. FEVER: "Do you have a fever?" If Yes, ask: "What is your temperature, how was it measured, and when did it start?"     no 7. SYMPTOMS: "Are there any other symptoms you're concerned about?" If Yes, ask: "When did it start?"     Starting to feel bad again, chest congested, sinus brown in color,chest feels tight has to keep oxygen on all day which is unusual 8. FOLLOW-UP APPOINTMENT: "Do you have a follow-up appointment with your doctor?"     No needs it on a Thursday.  Protocols used: Infection on Antibiotic Follow-up Call-A-AH

## 2022-02-03 NOTE — Telephone Encounter (Signed)
Noted  

## 2022-02-06 NOTE — Progress Notes (Deleted)
    SUBJECTIVE:   CHIEF COMPLAINT / HPI:  No chief complaint on file.   Recent hospitalization from 10/20 - 10/29 for acute on chronic hypoxemic hypercapnic respiratory failure initially requiring BiPAP but was weaned to 3 L nasal cannula (home oxygen requirement) with plan to continue BiPAP at night.  There was concern for pulmonary edema versus pneumonia based on chest x-ray so she was treated with empiric azithromycin and ceftriaxone.  Recommendation for SNF however due to difficulty with placement, patient was discharged home with home health and was sent in a NIV trilogy machine.  Home lisinopril was held due to AKI.  PCP Follow-up Recommendations: Ensure adherence/tolerance with BiPAP (Trilogy), home oxygen Recommend recheck BMP before restarting lisinopril. PCP to restart lisinopril and monitor.  Recommend outpatient MRI to evaluate right knee s/p fall at home. Consider further evaluation of hypercalcemia with PCP.  In the interim she has been seen by Dr. Jinny Sanders on 11/1 via video visit for suspected influenza infection based on known sick contacts and was treated with oseltamivir for 5 days.    On chart review, patient did have mild hypercalcemia with normal PTH noted during hospitalization December 2020 thought to be related to thiazide diuretic which was discontinued at that time.  PERTINENT  PMH / PSH: Chronic respiratory failure on 3 L nasal cannula, OSA, suspected OHS, morbid obesity, HTN, hypercalcemia, depression, asthma  Patient Care Team: Lenoria Chime, MD as PCP - General (Family Medicine)   OBJECTIVE:   There were no vitals taken for this visit.  Physical Exam      07/06/2021    9:48 AM  Depression screen PHQ 2/9  Decreased Interest 0  Down, Depressed, Hopeless 0  PHQ - 2 Score 0     {Show previous vital signs (optional):23777}  {Labs  Heme  Chem  Endocrine  Serology  Results Review (optional):23779}  ASSESSMENT/PLAN:   No problem-specific  Assessment & Plan notes found for this encounter.    No follow-ups on file.   Zola Button, MD [place]

## 2022-02-06 NOTE — Patient Instructions (Incomplete)
It was nice seeing you today!  Blood work today.  See me in 3 months or whenever is a good for you.  Stay well, Janice Piechocki, MD [facility] [phone]  --  Make sure to check out at the front desk before you leave today.  Please arrive at least 15 minutes prior to your scheduled appointments.  If you had blood work today, I will send you a MyChart message or a letter if results are normal. Otherwise, I will give you a call.  If you had a referral placed, they will call you to set up an appointment. Please give us a call if you don't hear back in the next 2 weeks.  If you need additional refills before your next appointment, please call your pharmacy first.  

## 2022-02-09 ENCOUNTER — Ambulatory Visit: Payer: BLUE CROSS/BLUE SHIELD | Admitting: Family Medicine

## 2022-02-15 ENCOUNTER — Telehealth: Payer: Self-pay

## 2022-02-15 NOTE — Progress Notes (Deleted)
    SUBJECTIVE:   CHIEF COMPLAINT / HPI:   New Patient visit hospital follow up  PMH HTN -  Obesity - Chronic respiratory failure with hypoxia 2/2 OHS/OSA - on Trilogy BiPAP machine at night and 3L [place] O2 at home, missed pulmonology appt on 02/02/22 Moderate persistent asthma -  Chronic right knee pain - XR negative in hospital, had fall at home, likely needs MRI outpatient, getting HHPT Fibromyalgia- on gabapentin, meloxicam, tramadol BID Major depressive disorder -  Hypercalcemia- noted in hospital ADHD- Adderall XL Anxiety- buspar, duloxetine, trazodone qHS  Social history Never smoker No alcohol or other substance use  Surgical History Hernia repair Left knee replacement  Family History Mother- Afib Father- CHF   OBJECTIVE:   There were no vitals taken for this visit.  General: A&O, NAD HEENT: No sign of trauma, EOM grossly intact Cardiac: RRR, no m/r/g Respiratory: CTAB, normal WOB, no w/c/r GI: Soft, NTTP, non-distended  Extremities: NTTP, no peripheral edema. Neuro: Normal gait, moves all four extremities appropriately. Psych: Appropriate mood and affect   ASSESSMENT/PLAN:   No problem-specific Assessment & Plan notes found for this encounter.     Billey Co, MD [facility]

## 2022-02-15 NOTE — Telephone Encounter (Signed)
Agree needs appt tomorrow to be seen in person. Agree with ED precautions. If cannot be seen in clinic reliably she has not physically established in person yet with our clinic.  Burley Saver MD

## 2022-02-15 NOTE — Telephone Encounter (Signed)
Patient calls nurse line requesting a refill on "flu medication."   Patient reports a continued cough and reports fatigue. Patient reports, "I am just lying here on the bed." Patient is requesting an antibiotic. She denies any fevers, body aches or chills. No SOB and she is speaking in full sentences.   Patient advised she has an apt tomorrow with PCP. She reports "I will try and make that." Advised per last note PCP would not be sending in an antibiotic without an apt. Patient stated, "Janice Ryan are not taking this seriously and some people have transportation problems."   Patient advised to call us tomorrow if she can not make apt. Patient reports, "I will try and be there."   ED precautions given.   Will forward to PCP.

## 2022-02-15 NOTE — Telephone Encounter (Signed)
Called Ms Jeffries-Boykin today.   Discussed that she has had two No Shows at our clinic already, and to establish care needs an in person visit. Discussed that per our clinic no show policy, if she does not show tomorrow or cancels within 24 hours she will be dismissed with our clinic and unable to establish care.  She states she will be at her appt tomorrow. Answered all questions and concerns.  Burley Saver MD

## 2022-02-16 ENCOUNTER — Institutional Professional Consult (permissible substitution): Payer: Self-pay | Admitting: Pulmonary Disease

## 2022-02-16 ENCOUNTER — Telehealth: Payer: Self-pay | Admitting: Family Medicine

## 2022-02-16 ENCOUNTER — Ambulatory Visit: Payer: Self-pay

## 2022-02-16 ENCOUNTER — Ambulatory Visit: Payer: BLUE CROSS/BLUE SHIELD | Admitting: Family Medicine

## 2022-02-16 NOTE — Telephone Encounter (Signed)
Patient called, left VM to return the call to the office to discuss medication/symptoms with a nurse.  Summary: flu symptoms   Pt called and stated she still has Symptoms of the flu and asked if another round of medication can be called in/ antibiotic and thera flu / please advise

## 2022-02-16 NOTE — Telephone Encounter (Signed)
Third attempt to contact patient- left message to call office °

## 2022-02-16 NOTE — Telephone Encounter (Signed)
Message from Areatha Keas sent at 02/16/2022  8:34 AM EST  Summary: flu symptoms   Pt called and stated she still has Symptoms of the flu and asked if another round of medication can be called in/ antibiotic and thera flu / please advise  ----- Message from Areatha Keas sent at 02/16/2022  8:34 AM EST ----- Pt called and stated she still has Symptoms of the flu and asked if another round of medication can be called in/ antibiotic and thera flu / please advise        Attempted to call pt. LM on VM to call back to discuss sx.

## 2022-02-16 NOTE — Telephone Encounter (Addendum)
Ms Janice Ryan calls this morning to discuss she cannot come to her appointment as she is waiting on oxygen tank delivery. I asked her if she knew this sooner than this morning as I talked to her yesterday and she stated she could make her appt today. She did not directly answer this question but discussed several issues with transportation including her oxygen tank.  I re-iterated that per our policy, if a new patient No shows they are not allowed to reschedule, and we have already rescheduled once for no show as a new patient on 02/09/22. She has a had a virtual visit while awaiting her initial appt but has not physically seen [place] to establish in clinic. She has also already had one late/same day cancellation within 24hrs on 02/02/22.  As she was going to no show today, I asked her appt to be cancelled to make room for other patients but she stated she was not coming today.   I stated I was sorry but she will not be able to establish care with our clinic as she has no showed her initial appt and had two late cancellations including today. I discussed she may be better served at a different clinic.  She stated she understands and wishes [place] the best. Answered all questions.  I will message her previous PCP and she has an appt already scheduled at [facility] at [facility] on 02/27/22. We will send official dismissal letter.  I did discuss if she can't come I recommend her being seen, and she states she is going to go to urgent care/ED.  Burley Saver MD

## 2022-02-16 NOTE — Telephone Encounter (Addendum)
Discussed with PCP. Certified dismissal letter sent. Forwarded to Practice administration to flag chart.

## 2022-02-19 ENCOUNTER — Encounter: Payer: Self-pay | Admitting: Family Medicine

## 2022-02-19 ENCOUNTER — Other Ambulatory Visit: Payer: Self-pay | Admitting: Student

## 2022-02-20 ENCOUNTER — Ambulatory Visit: Payer: Self-pay | Admitting: *Deleted

## 2022-02-20 MED ORDER — FLUTICASONE PROPIONATE 50 MCG/ACT NA SUSP: 2.0000 | Freq: Every day | NASAL | 2 refills | Status: AC

## 2022-02-20 NOTE — Telephone Encounter (Signed)
Reason for Disposition  SEVERE coughing spells (e.g., whooping sound after coughing, vomiting after coughing)  Answer Assessment - Initial Assessment Questions 1. ONSET: "When did the cough begin?"      Hospitalized 2. SEVERITY: "How bad is the cough today?"      SAme, bad 3. SPUTUM: "Describe the color of your sputum" (none, dry cough; clear, white, yellow, green)     Greenish "So much of this stuff." 4. HEMOPTYSIS: "Are you coughing up any blood?" If so ask: "How much?" (flecks, streaks, tablespoons, etc.)      5. DIFFICULTY BREATHING: "Are you having difficulty breathing?" If Yes, ask: "How bad is it?" (e.g., mild, moderate, severe)    - MILD: No SOB at rest, mild SOB with walking, speaks normally in sentences, can lie down, no retractions, pulse < 100.    - MODERATE: SOB at rest, SOB with minimal exertion and prefers to sit, cannot lie down flat, speaks in phrases, mild retractions, audible wheezing, pulse 100-120.    - SEVERE: Very SOB at rest, speaks in single words, struggling to breathe, sitting hunched forward, retractions, pulse > 120      Home O2, SAts 94% Unsure of liters 6. FEVER: "Do you have a fever?" If Yes, ask: "What is your temperature, how was it measured, and when did it start?"     no 10. OTHER SYMPTOMS: "Do you have any other symptoms?" (e.g., runny nose, wheezing, chest pain)       "Like the flu."  Protocols used: Cough - Acute Productive-A-AH

## 2022-02-20 NOTE — Telephone Encounter (Signed)
  Chief Complaint: Cough Symptoms: Productive, greenish. "Flu like feeling." Frequency: Hospitalized 01/20/22 Pertinent Negatives: Patient denies  Disposition: [] ED /[] Urgent Care (no appt availability in office) / [] Appointment(In office/virtual)/ []  [place] [facility]/ [] Home Care/ [] Refused Recommended Disposition /[] [place] [facility]/ [x]  Follow-up with PCP Additional Notes: Pt stating "Still feel really bad after hospital stay." States there is confusion regarding if she is still a pt at [facility]. States while in hospital she was told she would need to see Dr. at  [facility]. States she told them she had a PCP but was told to go there. States she could not make it to all those appt and she was dismissed. Would like appt with [name] "She is the best." Advised UC for this acute issue, states will follow disposition. Please see previous encounters. Please advise regarding appt if appropriate.

## 2022-02-27 ENCOUNTER — Ambulatory Visit: Payer: BLUE CROSS/BLUE SHIELD | Admitting: Family

## 2022-02-27 ENCOUNTER — Institutional Professional Consult (permissible substitution): Payer: Self-pay | Admitting: Pulmonary Disease

## 2022-02-28 ENCOUNTER — Other Ambulatory Visit: Payer: Self-pay | Admitting: Family

## 2022-02-28 DIAGNOSIS — I10 Essential (primary) hypertension: Secondary | ICD-10-CM

## 2022-03-01 NOTE — Telephone Encounter (Signed)
Chart has been flagged to start 03/18/22. Jone Baseman, CMA

## 2022-03-08 ENCOUNTER — Institutional Professional Consult (permissible substitution): Payer: Self-pay | Admitting: Primary Care

## 2022-03-16 NOTE — Progress Notes (Deleted)
Synopsis: Referred for moderate persistent asthma by [name], NP  Subjective:   PATIENT ID: Janice Ryan GENDER: female DOB: 03-06-1956, MRN: [id number]  No chief complaint on file.  66yF with history of moderate persistent asthma, obesity, chronic hypoxic respiratory failure   Last admission sent home with trilogy  Otherwise pertinent review of systems is negative.  Past Medical History:  Diagnosis Date   Acute respiratory failure with hypoxia (HCC)    Asthma    Hypertension    Morbid obesity (HCC)    Obesity with alveolar hypoventilation and body mass index (BMI) of 40 or greater (HCC)    OSA (obstructive sleep apnea)      Family History  Problem Relation Age of Onset   Atrial fibrillation Mother    Congestive Heart Failure Father      Past Surgical History:  Procedure Laterality Date   HERNIA REPAIR     JOINT REPLACEMENT Left    knee    Social History   Socioeconomic History   Marital status: Married    Spouse name: Not on file   Number of children: Not on file   Years of education: Not on file   Highest education level: Not on file  Occupational History   Not on file  Tobacco Use   Smoking status: Never   Smokeless tobacco: Never  Vaping Use   Vaping Use: Never used  Substance and Sexual Activity   Alcohol use: Never   Drug use: Never   Sexual activity: Not Currently  Other Topics Concern   Not on file  Social History Narrative   Not on file   Social Determinants of Health   Financial Resource Strain: Not on file  Food Insecurity: No Food Insecurity (01/24/2022)   Hunger Vital Sign    Worried About Running Out of Food in the Last Year: Never true    Ran Out of Food in the Last Year: Never true  Transportation Needs: No Transportation Needs (01/24/2022)   PRAPARE - Administrator, Civil Service (Medical): No    Lack of Transportation (Non-Medical): No  Physical Activity: Not on file  Stress: Not on file  Social  Connections: Not on file  Intimate Partner Violence: Not At Risk (01/24/2022)   Humiliation, Afraid, Rape, and Kick questionnaire    Fear of Current or Ex-Partner: No    Emotionally Abused: No    Physically Abused: No    Sexually Abused: No     Allergies  Allergen Reactions   Neurontin [Gabapentin] Other (See Comments)    Has un-controlled movement of her body, excessive dribble of saliva at night   Sulfa Antibiotics Nausea And Vomiting and Other (See Comments)    Stomach pain     Outpatient Medications Prior to Visit  Medication Sig Dispense Refill   acetaminophen (TYLENOL) 500 MG tablet Take 2,000 mg by mouth daily as needed for mild pain or headache.     albuterol (PROVENTIL) (2.5 MG/3ML) 0.083% nebulizer solution Take 3 mLs (2.5 mg total) by nebulization every 6 (six) hours as needed for wheezing or shortness of breath. 150 mL 2   albuterol (VENTOLIN HFA) 108 (90 Base) MCG/ACT inhaler Inhale 2 puffs into the lungs every 6 (six) hours as needed for wheezing or shortness of breath. 18 g 2   amphetamine-dextroamphetamine (ADDERALL XR) 20 MG 24 hr capsule Take 20 mg by mouth daily.     busPIRone (BUSPAR) 5 MG tablet TAKE ONE TABLET BY MOUTH THREE  TIMES A DAY (Patient taking differently: Take 5 mg by mouth 3 (three) times daily.) 90 tablet 1   carbamide peroxide (DEBROX) 6.5 % OTIC solution Place 2-4 drops into both ears 2 (two) times daily as needed (to clean ears).     cetirizine (ZYRTEC ALLERGY) 10 MG tablet Take 1 tablet (10 mg total) by mouth daily. (Patient taking differently: Take 10 mg by mouth daily as needed for allergies.) 30 tablet 11   Cholecalciferol (VITAMIN D3 PO) Take 1 tablet by mouth daily.     diclofenac Sodium (VOLTAREN) 1 % GEL Apply 2 g topically 4 (four) times daily as needed. 350 g 0   DULoxetine (CYMBALTA) 60 MG capsule Take 1 capsule (60 mg total) by mouth daily. (Patient taking differently: Take 120 mg by mouth daily.) 30 capsule 2   fluticasone (FLONASE) 50  MCG/ACT nasal spray Place 2 sprays into both nostrils daily.  2   fluticasone-salmeterol (ADVAIR) 500-50 MCG/ACT AEPB Inhale 1 puff into the lungs in the morning and at bedtime. 60 each 2   gabapentin (NEURONTIN) 400 MG capsule Take 400 mg by mouth at bedtime.     meloxicam (MOBIC) 7.5 MG tablet Take 1 tablet (7.5 mg total) by mouth daily. As needed for pain; take after eating (Patient taking differently: Take 7.5 mg by mouth daily as needed for pain.) 30 tablet 4   Multiple Vitamin (MULTIVITAMIN WITH MINERALS) TABS tablet Take 1 tablet by mouth daily.     Omega-3 Fatty Acids (OMEGA-3 PO) Take 1 capsule by mouth daily.     OXYGEN Inhale into the lungs at bedtime as needed (2 L).     traMADol (ULTRAM) 50 MG tablet Take 100 mg by mouth 2 (two) times daily as needed (pain).     traZODone (DESYREL) 50 MG tablet Take 1 tablet (50 mg total) by mouth at bedtime. 30 tablet 1   No facility-administered medications prior to visit.       Objective:   Physical Exam:  General appearance: 66 y.o., female, NAD, conversant  Eyes: anicteric sclerae; PERRL, tracking appropriately HENT: NCAT; MMM Neck: Trachea midline; no lymphadenopathy, no JVD Lungs: CTAB, no crackles, no wheeze, with normal respiratory effort CV: RRR, no murmur  Abdomen: Soft, non-tender; non-distended, BS present  Extremities: No peripheral edema, warm Skin: Normal turgor and texture; no rash Psych: Appropriate affect Neuro: Alert and oriented to person and place, no focal deficit     There were no vitals filed for this visit.   on *** LPM *** RA BMI Readings from Last 3 Encounters:  01/29/22 69.85 kg/m  08/25/21 84.58 kg/m  08/08/19 89.56 kg/m   Wt Readings from Last 3 Encounters:  01/29/22 (!) 446 lb (202.3 kg)  08/25/21 (!) 540 lb (244.9 kg)  03/31/19 (!) 589 lb (267.2 kg)     CBC    Component Value Date/Time   WBC 8.1 01/29/2022 0114   RBC 3.81 (L) 01/29/2022 0114   HGB 11.1 (L) 01/29/2022 0114   HCT  35.6 (L) 01/29/2022 0114   PLT 217 01/29/2022 0114   MCV 93.4 01/29/2022 0114   MCH 29.1 01/29/2022 0114   MCHC 31.2 01/29/2022 0114   RDW 12.3 01/29/2022 0114   LYMPHSABS 1.5 08/08/2019 0752   MONOABS 0.8 08/08/2019 0752   EOSABS 0.2 08/08/2019 0752   BASOSABS 0.0 08/08/2019 0752    Eos 100-200  Chest Imaging: CTA Chest 2020 with 2-3cm nodule vs cyst vs LN anterior mediastinum - stable over 8 months. Cardiomegaly.  Pulmonary Functions Testing Results:    Latest Ref Rng & Units 01/24/2022    4:19 PM  PFT Results  FVC-Pre L 1.76   FVC-Predicted Pre % 49   Pre FEV1/FVC % % 81   FEV1-Pre L 1.43   FEV1-Predicted Pre % 52     Moderately severe obstruction based on FEV1/SVC, FEV1, likely moderately severe restriction  Echocardiogram 01/22/22:   1. Suboptimal echocardiographic images   2. Left ventricular ejection fraction, by estimation, is >75%. The left  ventricle has hyperdynamic function. The left ventricle has no regional  wall motion abnormalities. Left ventricular diastolic function could not  be evaluated.   3. The aortic valve was not well visualized. Aortic valve regurgitation  is not visualized. Mild aortic valve stenosis.   Heart Catheterization: ***    Assessment & Plan:    Plan:      Omar Person, MD [facility] 03/16/2022 7:34 PM

## 2022-03-17 ENCOUNTER — Institutional Professional Consult (permissible substitution): Payer: Self-pay | Admitting: Student

## 2022-04-05 ENCOUNTER — Telehealth: Payer: Self-pay

## 2022-04-05 NOTE — Telephone Encounter (Signed)
Copied from [place] [phone]. Topic: Appointment Scheduling - Scheduling Inquiry for Clinic >> Apr 05, 2022 10:01 AM [name] wrote: Reason for CRM: The patient would like to contacted by a member of administrative staff regarding their status as a patient of the practice  The patient is interested in being seen by Prov. [name] again, the patient shares that they were removed as a patient in error by a member of staff at a different facility   Please contact the patient further when possible  Pt insurance is no longer in network to be able to see [name], FNP she is with [facility] which is OON w/PCE was established w/Margaret Pearline Cables, MD on 02/01/22 She would need to locate provider in her insurance network.

## 2022-04-14 ENCOUNTER — Other Ambulatory Visit: Payer: Self-pay | Admitting: Family

## 2022-04-14 DIAGNOSIS — I10 Essential (primary) hypertension: Secondary | ICD-10-CM

## 2022-04-14 NOTE — Telephone Encounter (Signed)
Requested medications are due for refill today.  unsure  Requested medications are on the active medications list.  no  Last refill. 01/06/2022  Future visit scheduled.   no  Notes to clinic.  Medication was discontinued. Unsure if pt is still with [facility].    Requested Prescriptions  Pending Prescriptions Disp Refills   lisinopril (ZESTRIL) 20 MG tablet [Pharmacy Med Name: LISINOPRIL 20 MG TABLET] 90 tablet 0    Sig: TAKE 1 TABLET BY MOUTH EVERY DAY     Cardiovascular:  ACE Inhibitors Failed - 04/14/2022  5:00 PM      Failed - Cr in normal range and within 180 days    Creatinine, Ser  Date Value Ref Range Status  01/29/2022 1.04 (H) 0.44 - 1.00 mg/dL Final         Failed - Last BP in normal range    BP Readings from Last 1 Encounters:  01/29/22 (!) 140/73         Failed - Valid encounter within last 6 months    Recent Outpatient Visits           3 months ago Chronic frontal sinusitis   [facility] Care at [facility], [name] J, NP   9 months ago Chronic frontal sinusitis   Primary Care at [facility], [name] J, NP   9 months ago Acute sinusitis, recurrence not specified, unspecified location   Primary Care at [facility], Cari S, PA-C   9 months ago    Primary Care at [facility], Cari S, PA-C   9 months ago    Primary Care at [facility], Cari S, PA-C              Passed - K in normal range and within 180 days    Potassium  Date Value Ref Range Status  01/29/2022 4.1 3.5 - 5.1 mmol/L Final         Passed - Patient is not pregnant

## 2022-04-26 ENCOUNTER — Institutional Professional Consult (permissible substitution): Payer: BLUE CROSS/BLUE SHIELD | Admitting: Internal Medicine

## 2022-04-26 NOTE — Progress Notes (Deleted)
Janice Ryan, female    DOB: 1956/02/14   MRN: [id number]   Brief patient profile:  ***  *** referred to pulmonary clinic 04/26/2022 by *** for ***        History of Present Illness  04/26/2022  Pulmonary/ 1st office eval/Nisa Decaire  No chief complaint on file.    Dyspnea:  *** Cough: *** Sleep: *** SABA use:   Past Medical History:  Diagnosis Date   Acute respiratory failure with hypoxia (HCC)    Asthma    Hypertension    Morbid obesity ([place])    Obesity with alveolar hypoventilation and body mass index (BMI) of 40 or greater (HCC)    OSA (obstructive sleep apnea)     Outpatient Medications Prior to Visit  Medication Sig Dispense Refill   acetaminophen (TYLENOL) 500 MG tablet Take 2,000 mg by mouth daily as needed for mild pain or headache.     albuterol (PROVENTIL) (2.5 MG/3ML) 0.083% nebulizer solution Take 3 mLs (2.5 mg total) by nebulization every 6 (six) hours as needed for wheezing or shortness of breath. 150 mL 2   albuterol (VENTOLIN HFA) 108 (90 Base) MCG/ACT inhaler Inhale 2 puffs into the lungs every 6 (six) hours as needed for wheezing or shortness of breath. 18 g 2   amphetamine-dextroamphetamine (ADDERALL XR) 20 MG 24 hr capsule Take 20 mg by mouth daily.     busPIRone (BUSPAR) 5 MG tablet TAKE ONE TABLET BY MOUTH THREE TIMES A DAY (Patient taking differently: Take 5 mg by mouth 3 (three) times daily.) 90 tablet 1   carbamide peroxide (DEBROX) 6.5 % OTIC solution Place 2-4 drops into both ears 2 (two) times daily as needed (to clean ears).     cetirizine (ZYRTEC ALLERGY) 10 MG tablet Take 1 tablet (10 mg total) by mouth daily. (Patient taking differently: Take 10 mg by mouth daily as needed for allergies.) 30 tablet 11   Cholecalciferol (VITAMIN D3 PO) Take 1 tablet by mouth daily.     diclofenac Sodium (VOLTAREN) 1 % GEL Apply 2 g topically 4 (four) times daily as needed. 350 g 0   DULoxetine (CYMBALTA) 60 MG capsule Take 1 capsule (60 mg total) by mouth daily.  (Patient taking differently: Take 120 mg by mouth daily.) 30 capsule 2   fluticasone (FLONASE) 50 MCG/ACT nasal spray Place 2 sprays into both nostrils daily.  2   fluticasone-salmeterol (ADVAIR) 500-50 MCG/ACT AEPB Inhale 1 puff into the lungs in the morning and at bedtime. 60 each 2   gabapentin (NEURONTIN) 400 MG capsule Take 400 mg by mouth at bedtime.     meloxicam (MOBIC) 7.5 MG tablet Take 1 tablet (7.5 mg total) by mouth daily. As needed for pain; take after eating (Patient taking differently: Take 7.5 mg by mouth daily as needed for pain.) 30 tablet 4   Multiple Vitamin (MULTIVITAMIN WITH MINERALS) TABS tablet Take 1 tablet by mouth daily.     Omega-3 Fatty Acids (OMEGA-3 PO) Take 1 capsule by mouth daily.     OXYGEN Inhale into the lungs at bedtime as needed (2 L).     traMADol (ULTRAM) 50 MG tablet Take 100 mg by mouth 2 (two) times daily as needed (pain).     traZODone (DESYREL) 50 MG tablet Take 1 tablet (50 mg total) by mouth at bedtime. 30 tablet 1   No facility-administered medications prior to visit.     Objective:     There were no vitals taken for this visit.  Assessment   No problem-specific Assessment & Plan notes found for this encounter.     Christinia Gully, MD 04/26/2022

## 2022-05-21 ENCOUNTER — Other Ambulatory Visit: Payer: Self-pay | Admitting: Family

## 2022-05-21 DIAGNOSIS — I10 Essential (primary) hypertension: Secondary | ICD-10-CM

## 2022-05-23 NOTE — Progress Notes (Deleted)
Janice Ryan, female    DOB: 1955/06/08   MRN: [id number]   Brief patient profile:  85  *** referred to pulmonary clinic 05/24/2022 by *** for ***        History of Present Illness  05/24/2022  Pulmonary/ 1st office eval/Oral Remache  No chief complaint on file.    Dyspnea:  *** Cough: *** Sleep: *** SABA use:   Past Medical History:  Diagnosis Date   Acute respiratory failure with hypoxia (HCC)    Asthma    Hypertension    Morbid obesity ([place])    Obesity with alveolar hypoventilation and body mass index (BMI) of 40 or greater (HCC)    OSA (obstructive sleep apnea)     Outpatient Medications Prior to Visit  Medication Sig Dispense Refill   acetaminophen (TYLENOL) 500 MG tablet Take 2,000 mg by mouth daily as needed for mild pain or headache.     albuterol (PROVENTIL) (2.5 MG/3ML) 0.083% nebulizer solution Take 3 mLs (2.5 mg total) by nebulization every 6 (six) hours as needed for wheezing or shortness of breath. 150 mL 2   albuterol (VENTOLIN HFA) 108 (90 Base) MCG/ACT inhaler Inhale 2 puffs into the lungs every 6 (six) hours as needed for wheezing or shortness of breath. 18 g 2   amphetamine-dextroamphetamine (ADDERALL XR) 20 MG 24 hr capsule Take 20 mg by mouth daily.     busPIRone (BUSPAR) 5 MG tablet TAKE ONE TABLET BY MOUTH THREE TIMES A DAY (Patient taking differently: Take 5 mg by mouth 3 (three) times daily.) 90 tablet 1   carbamide peroxide (DEBROX) 6.5 % OTIC solution Place 2-4 drops into both ears 2 (two) times daily as needed (to clean ears).     cetirizine (ZYRTEC ALLERGY) 10 MG tablet Take 1 tablet (10 mg total) by mouth daily. (Patient taking differently: Take 10 mg by mouth daily as needed for allergies.) 30 tablet 11   Cholecalciferol (VITAMIN D3 PO) Take 1 tablet by mouth daily.     diclofenac Sodium (VOLTAREN) 1 % GEL Apply 2 g topically 4 (four) times daily as needed. 350 g 0   DULoxetine (CYMBALTA) 60 MG capsule Take 1 capsule (60 mg total) by mouth daily.  (Patient taking differently: Take 120 mg by mouth daily.) 30 capsule 2   fluticasone (FLONASE) 50 MCG/ACT nasal spray Place 2 sprays into both nostrils daily.  2   fluticasone-salmeterol (ADVAIR) 500-50 MCG/ACT AEPB Inhale 1 puff into the lungs in the morning and at bedtime. 60 each 2   gabapentin (NEURONTIN) 400 MG capsule Take 400 mg by mouth at bedtime.     meloxicam (MOBIC) 7.5 MG tablet Take 1 tablet (7.5 mg total) by mouth daily. As needed for pain; take after eating (Patient taking differently: Take 7.5 mg by mouth daily as needed for pain.) 30 tablet 4   Multiple Vitamin (MULTIVITAMIN WITH MINERALS) TABS tablet Take 1 tablet by mouth daily.     Omega-3 Fatty Acids (OMEGA-3 PO) Take 1 capsule by mouth daily.     OXYGEN Inhale into the lungs at bedtime as needed (2 L).     traMADol (ULTRAM) 50 MG tablet Take 100 mg by mouth 2 (two) times daily as needed (pain).     traZODone (DESYREL) 50 MG tablet Take 1 tablet (50 mg total) by mouth at bedtime. 30 tablet 1   No facility-administered medications prior to visit.     Objective:     There were no vitals taken for this visit.  Assessment   No problem-specific Assessment & Plan notes found for this encounter.     Christinia Gully, MD 05/23/2022

## 2022-05-24 ENCOUNTER — Institutional Professional Consult (permissible substitution): Payer: BLUE CROSS/BLUE SHIELD | Admitting: Internal Medicine

## 2022-06-12 ENCOUNTER — Encounter (INDEPENDENT_AMBULATORY_CARE_PROVIDER_SITE_OTHER): Payer: Self-pay

## 2022-06-16 ENCOUNTER — Ambulatory Visit: Payer: BLUE CROSS/BLUE SHIELD | Admitting: Family Medicine

## 2022-06-19 ENCOUNTER — Institutional Professional Consult (permissible substitution): Payer: BLUE CROSS/BLUE SHIELD | Admitting: Internal Medicine

## 2022-06-19 NOTE — Progress Notes (Deleted)
Janice Ryan, female    DOB: 07-Oct-1955   MRN: [id number]   Brief patient profile:  88  *** referred to pulmonary clinic 06/19/2022 by *** for ***        History of Present Illness  06/19/2022  Pulmonary/ 1st office eval/Aj Crunkleton  No chief complaint on file.    Dyspnea:  *** Cough: *** Sleep: *** SABA use:   Past Medical History:  Diagnosis Date   Acute respiratory failure with hypoxia (HCC)    Asthma    Hypertension    Morbid obesity ([place])    Obesity with alveolar hypoventilation and body mass index (BMI) of 40 or greater (HCC)    OSA (obstructive sleep apnea)     Outpatient Medications Prior to Visit  Medication Sig Dispense Refill   acetaminophen (TYLENOL) 500 MG tablet Take 2,000 mg by mouth daily as needed for mild pain or headache.     albuterol (PROVENTIL) (2.5 MG/3ML) 0.083% nebulizer solution Take 3 mLs (2.5 mg total) by nebulization every 6 (six) hours as needed for wheezing or shortness of breath. 150 mL 2   albuterol (VENTOLIN HFA) 108 (90 Base) MCG/ACT inhaler Inhale 2 puffs into the lungs every 6 (six) hours as needed for wheezing or shortness of breath. 18 g 2   amphetamine-dextroamphetamine (ADDERALL XR) 20 MG 24 hr capsule Take 20 mg by mouth daily.     busPIRone (BUSPAR) 5 MG tablet TAKE ONE TABLET BY MOUTH THREE TIMES A DAY (Patient taking differently: Take 5 mg by mouth 3 (three) times daily.) 90 tablet 1   carbamide peroxide (DEBROX) 6.5 % OTIC solution Place 2-4 drops into both ears 2 (two) times daily as needed (to clean ears).     cetirizine (ZYRTEC ALLERGY) 10 MG tablet Take 1 tablet (10 mg total) by mouth daily. (Patient taking differently: Take 10 mg by mouth daily as needed for allergies.) 30 tablet 11   Cholecalciferol (VITAMIN D3 PO) Take 1 tablet by mouth daily.     diclofenac Sodium (VOLTAREN) 1 % GEL Apply 2 g topically 4 (four) times daily as needed. 350 g 0   DULoxetine (CYMBALTA) 60 MG capsule Take 1 capsule (60 mg total) by mouth daily.  (Patient taking differently: Take 120 mg by mouth daily.) 30 capsule 2   fluticasone (FLONASE) 50 MCG/ACT nasal spray Place 2 sprays into both nostrils daily.  2   fluticasone-salmeterol (ADVAIR) 500-50 MCG/ACT AEPB Inhale 1 puff into the lungs in the morning and at bedtime. 60 each 2   gabapentin (NEURONTIN) 400 MG capsule Take 400 mg by mouth at bedtime.     meloxicam (MOBIC) 7.5 MG tablet Take 1 tablet (7.5 mg total) by mouth daily. As needed for pain; take after eating (Patient taking differently: Take 7.5 mg by mouth daily as needed for pain.) 30 tablet 4   Multiple Vitamin (MULTIVITAMIN WITH MINERALS) TABS tablet Take 1 tablet by mouth daily.     Omega-3 Fatty Acids (OMEGA-3 PO) Take 1 capsule by mouth daily.     OXYGEN Inhale into the lungs at bedtime as needed (2 L).     traMADol (ULTRAM) 50 MG tablet Take 100 mg by mouth 2 (two) times daily as needed (pain).     traZODone (DESYREL) 50 MG tablet Take 1 tablet (50 mg total) by mouth at bedtime. 30 tablet 1   No facility-administered medications prior to visit.     Objective:     There were no vitals taken for this visit.  Assessment   No problem-specific Assessment & Plan notes found for this encounter.     Christinia Gully, MD 06/19/2022

## 2022-07-04 ENCOUNTER — Encounter: Payer: BLUE CROSS/BLUE SHIELD | Admitting: Family

## 2022-07-05 ENCOUNTER — Encounter: Payer: BLUE CROSS/BLUE SHIELD | Admitting: Urology

## 2022-07-16 NOTE — Progress Notes (Signed)
  This encounter was created in error - please disregard. No show 

## 2022-07-17 ENCOUNTER — Other Ambulatory Visit: Payer: Self-pay | Admitting: Family

## 2022-07-17 DIAGNOSIS — J4540 Moderate persistent asthma, uncomplicated: Secondary | ICD-10-CM

## 2022-07-17 DIAGNOSIS — Z9981 Dependence on supplemental oxygen: Secondary | ICD-10-CM

## 2022-07-18 ENCOUNTER — Other Ambulatory Visit: Payer: Self-pay | Admitting: Family

## 2022-07-18 DIAGNOSIS — I10 Essential (primary) hypertension: Secondary | ICD-10-CM

## 2022-07-18 NOTE — Telephone Encounter (Signed)
Requested medications are due for refill today.  unsure  Requested medications are on the active medications list.  yes  Last refill. 01/10/2022 18 2rf  Future visit scheduled.   no  Notes to clinic.  No PCP listed.    Requested Prescriptions  Pending Prescriptions Disp Refills   albuterol (VENTOLIN HFA) 108 (90 Base) MCG/ACT inhaler [Pharmacy Med Name: ALBUTEROL HFA (PROAIR) INHALER] 8.5 each 2    Sig: TAKE 2 PUFFS BY MOUTH EVERY 6 HOURS AS NEEDED FOR WHEEZE OR SHORTNESS OF BREATH     Pulmonology:  Beta Agonists 2 Failed - 07/17/2022  2:27 AM      Failed - Last BP in normal range    BP Readings from Last 1 Encounters:  01/29/22 (!) 140/73         Failed - Valid encounter within last 12 months    Recent Outpatient Visits           6 months ago Chronic frontal sinusitis   [facility] [place], NP   1 year ago Chronic frontal sinusitis   [facility], [name] J, NP   1 year ago Acute sinusitis, recurrence not specified, unspecified location   [facility], Kasandra Knudsen, PA-C   1 year ago    [facility], Kasandra Knudsen, PA-C   1 year ago    [facility], Kasandra Knudsen, PA-C       Future Appointments             In 3 days Ngetich, Donalee Citrin, NP [facility]            Passed - Last Heart Rate in normal range    Pulse Readings from Last 1 Encounters:  01/29/22 78

## 2022-07-19 ENCOUNTER — Encounter: Payer: Medicaid Other | Admitting: Radiology

## 2022-07-20 ENCOUNTER — Other Ambulatory Visit: Payer: Self-pay

## 2022-07-20 DIAGNOSIS — Z9981 Dependence on supplemental oxygen: Secondary | ICD-10-CM

## 2022-07-20 DIAGNOSIS — J4540 Moderate persistent asthma, uncomplicated: Secondary | ICD-10-CM

## 2022-07-20 MED ORDER — ALBUTEROL SULFATE HFA 108 (90 BASE) MCG/ACT IN AERS: 2.0000 | INHALATION_SPRAY | Freq: Four times a day (QID) | RESPIRATORY_TRACT | 2 refills | Status: DC | PRN

## 2022-07-21 ENCOUNTER — Ambulatory Visit: Payer: BLUE CROSS/BLUE SHIELD | Admitting: Family

## 2022-07-25 ENCOUNTER — Telehealth: Payer: Self-pay | Admitting: Family

## 2022-07-25 ENCOUNTER — Ambulatory Visit: Payer: Self-pay

## 2022-07-25 NOTE — Telephone Encounter (Signed)
  Chief Complaint: knee pain Symptoms: R knee pain, comes and goes, difficulty walking and using WC, pain 8/10 Frequency: ongoing 3 years  Pertinent Negatives: NA Disposition: ED /[] Urgent Care (no appt availability in office) / Appointment(In office/virtual)/  [facility]/ Home Care/ Refused Recommended Disposition /[] [facility]/  Follow-up with PCP Additional Notes: pt requesting to see [name], NP again since she was her PCP previously. Pt states that she never seen Dr. [name] besides in the hospital when I tried to explain that pt was no longer considered pt with [name], NP. Pt states her insurance has changed now since having [employer] and Medicaid. Pt takes Tylenol and Gabapentin to help with knee pain but has been in [facility] for quite some time d/t difficulty walking on RLE. Pt also stating she is suppose to be having MRI ordered and see's Dr. Case with Ortho for her knee. Scheduled OV 08/01/22 at 1420 with [name], NP and advised her that if any issues with appt, office would give her a call. Pt also wanting to discuss asthma and allergies and also get set up to do physical as well. Advised she can discuss setting future appts up during OV. No further assistance needed.   Reason for Disposition  Knee pain is a chronic symptom (recurrent or ongoing AND present > 4 weeks)  Answer Assessment - Initial Assessment Questions 1. LOCATION and RADIATION: "Where is the pain located?"      R knee  2. QUALITY: "What does the pain feel like?"  (e.g., sharp, dull, aching, burning)     Aching pain that getting worse  3. SEVERITY: "How bad is the pain?" "What does it keep you from doing?"   (Scale 1-10; or mild, moderate, severe)   -  MILD (1-3): doesn't interfere with normal activities    -  MODERATE (4-7): interferes with normal activities (e.g., work or school) or awakens from sleep, limping    -  SEVERE (8-10): excruciating pain, unable to do any normal activities, unable to walk      8/10 4. ONSET: "When did the pain start?" "Does it come and go, or is it there all the time?"     Ongoing 3 years  8. ASSOCIATED SYMPTOMS: "Is there any swelling or redness of the knee?"     no 9. OTHER SYMPTOMS: "Do you have any other symptoms?" (e.g., chest pain, difficulty breathing, fever, calf pain)     Difficulty walking  Protocols used: Knee Pain-A-AH

## 2022-07-25 NOTE — Telephone Encounter (Signed)
Referral Request - Has patient seen PCP for this complaint? yes *If NO, is insurance requiring patient see PCP for this issue before PCP can refer them? Referral for which specialty: MRI Preferred provider/office: [facility] radiology / says open machine can't take closed machine Reason for referral: MRI of right knee pain

## 2022-07-26 NOTE — Progress Notes (Signed)
Erroneous encounter-disregard

## 2022-07-26 NOTE — Telephone Encounter (Signed)
Ricky Stabs, NP did not order MRI for pt, pt has never had in-office visit w/provider, all visits were virtual. Pt transferred care in October of 2023 due to insurance, secondary insurance is still OON thru [employer]. Pt may want to find provider in network if cannot make to appt on 04/30 w/provider-appt needs to be in office and not by phone.

## 2022-08-01 ENCOUNTER — Telehealth: Payer: Medicare Other | Admitting: Physician Assistant

## 2022-08-01 ENCOUNTER — Other Ambulatory Visit: Payer: Self-pay | Admitting: Physician Assistant

## 2022-08-01 ENCOUNTER — Encounter: Payer: Medicare Other | Admitting: Family

## 2022-08-01 DIAGNOSIS — J4540 Moderate persistent asthma, uncomplicated: Secondary | ICD-10-CM

## 2022-08-01 DIAGNOSIS — Z9981 Dependence on supplemental oxygen: Secondary | ICD-10-CM

## 2022-08-01 MED ORDER — FLUTICASONE-SALMETEROL 500-50 MCG/ACT IN AEPB: 1.0000 | INHALATION_SPRAY | Freq: Two times a day (BID) | RESPIRATORY_TRACT | 2 refills | Status: DC

## 2022-08-01 MED ORDER — GABAPENTIN 300 MG PO CAPS: 300.0000 mg | ORAL_CAPSULE | Freq: Every day | ORAL | 0 refills | Status: DC

## 2022-08-01 MED ORDER — ALBUTEROL SULFATE HFA 108 (90 BASE) MCG/ACT IN AERS: 2.0000 | INHALATION_SPRAY | Freq: Four times a day (QID) | RESPIRATORY_TRACT | 2 refills | Status: DC | PRN

## 2022-08-01 NOTE — Progress Notes (Signed)
Courtesy refill Patient has appt with PCP on Thursday 08/03/22  Roney Jaffe, PA-C Physician Assistant [facility] [email or web address]

## 2022-08-02 ENCOUNTER — Telehealth: Payer: Medicare Other | Admitting: Family

## 2022-08-02 NOTE — Progress Notes (Unsigned)
Patient ID: Janice Ryan, female    DOB: [birth year]  MRN: [id number]  CC: Right Knee Pain  Subjective: Janice Ryan is a 67 y.o. female who presents for right knee pain.   Her concerns today include:  07/21/2022 [facility] - Orthopedics per note: [facility] radiology. Patient has no showed for her MRI several times (8 times). They have removed the order and will not be rescheduling patient.   Patient No Show to Pulmonology - Asthma  Allergies   07/25/2022 per triage RN call note: Chief Complaint: knee pain Symptoms: R knee pain, comes and goes, difficulty walking and using WC, pain 8/10 Frequency: ongoing 3 years  Pertinent Negatives: NA Disposition: [] ED /[] Urgent Care (no appt availability in office) / [x] Appointment(In office/virtual)/ []  [place] [facility]/ [] Home Care/ [] Refused Recommended Disposition /[] [place] [facility]/ []  Follow-up with PCP Additional Notes: pt requesting to see Janice Viveros, NP again since she was her PCP previously. Pt states that she never seen Janice Ryan besides in the hospital when I tried to explain that pt was no longer considered pt with Janice Cantrelle, NP. Pt states her insurance has changed now since having [employer] and Medicaid. Pt takes Tylenol and Gabapentin to help with knee pain but has been in [facility] for quite some time d/t difficulty walking on RLE. Pt also stating she is suppose to be having MRI ordered and see's Dr. Case with Ortho for her knee. Scheduled OV 08/01/22 at 1420 with Janice Fusilier, NP and advised her that if any issues with appt, office would give her a call. Pt also wanting to discuss asthma and allergies and also get set up to do physical as well. Advised she can discuss setting future appts up during OV. No further assistance needed.    Reason for Disposition  Knee pain is a chronic symptom (recurrent or ongoing AND present > 4 weeks)  Answer Assessment - Initial Assessment Questions 1. LOCATION  and RADIATION: "Where is the pain located?"      R knee  2. QUALITY: "What does the pain feel like?"  (e.g., sharp, dull, aching, burning)     Aching pain that getting worse  3. SEVERITY: "How bad is the pain?" "What does it keep you from doing?"   (Scale 1-10; or mild, moderate, severe)   -  MILD (1-3): doesn't interfere with normal activities    -  MODERATE (4-7): interferes with normal activities (e.g., work or school) or awakens from sleep, limping    -  SEVERE (8-10): excruciating pain, unable to do any normal activities, unable to walk     8/10 4. ONSET: "When did the pain start?" "Does it come and go, or is it there all the time?"     Ongoing 3 years  8. ASSOCIATED SYMPTOMS: "Is there any swelling or redness of the knee?"     no 9. OTHER SYMPTOMS: "Do you have any other symptoms?" (e.g., chest pain, difficulty breathing, fever, calf pain)     Difficulty walking  Protocols used: Knee Pain-A-AH  Today's visit 08/03/2022:    Patient Active Problem List   Diagnosis Date Noted   Suspected novel influenza A virus infection 02/01/2022   AKI (acute kidney injury) (HCC) 01/25/2022   Hypercalcemia 01/24/2022   Pneumonia due to infectious organism 01/23/2022   Acute on chronic respiratory failure with hypoxia and hypercapnia (HCC) 01/21/2022   Fall at home 01/21/2022   Poor social situation 01/21/2022   Major depressive  disorder, recurrent episode, 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   Chronic respiratory failure with hypoxia (HCC) 12/10/2018   HTN (hypertension) 12/10/2018   Chronic pain of right knee    Moderate persistent asthma 07/28/2018     Current Outpatient Medications on File Prior to Visit  Medication Sig Dispense Refill   acetaminophen (TYLENOL) 500 MG tablet Take 2,000 mg by mouth daily as needed for mild pain or headache.     albuterol (PROVENTIL) (2.5 MG/3ML) 0.083% nebulizer solution Take 3 mLs (2.5 mg total) by nebulization every 6 (six) hours as needed for wheezing or shortness  of breath. 150 mL 2   albuterol (VENTOLIN HFA) 108 (90 Base) MCG/ACT inhaler TAKE 2 PUFFS BY MOUTH EVERY 6 HOURS AS NEEDED FOR WHEEZE OR SHORTNESS OF BREATH 8.5 each 2   albuterol (VENTOLIN HFA) 108 (90 Base) MCG/ACT inhaler Inhale 2 puffs into the lungs every 6 (six) hours as needed for wheezing or shortness of breath. 18 g 2   amphetamine-dextroamphetamine (ADDERALL XR) 20 MG 24 hr capsule Take 20 mg by mouth daily.     busPIRone (BUSPAR) 5 MG tablet TAKE ONE TABLET BY MOUTH THREE TIMES A DAY (Patient taking differently: Take 5 mg by mouth 3 (three) times daily.) 90 tablet 1   carbamide peroxide (DEBROX) 6.5 % OTIC solution Place 2-4 drops into both ears 2 (two) times daily as needed (to clean ears).     cetirizine (ZYRTEC ALLERGY) 10 MG tablet Take 1 tablet (10 mg total) by mouth daily. (Patient taking differently: Take 10 mg by mouth daily as needed for allergies.) 30 tablet 11   Cholecalciferol (VITAMIN D3 PO) Take 1 tablet by mouth daily.     diclofenac Sodium (VOLTAREN) 1 % GEL Apply 2 g topically 4 (four) times daily as needed. 350 g 0   DULoxetine (CYMBALTA) 60 MG capsule Take 1 capsule (60 mg total) by mouth daily. (Patient taking differently: Take 120 mg by mouth daily.) 30 capsule 2   fluticasone (FLONASE) 50 MCG/ACT nasal spray Place 2 sprays into both nostrils daily.  2   fluticasone-salmeterol (ADVAIR) 500-50 MCG/ACT AEPB Inhale 1 puff into the lungs in the morning and at bedtime. 60 each 2   gabapentin (NEURONTIN) 300 MG capsule Take 1 capsule (300 mg total) by mouth at bedtime. 30 capsule 0   meloxicam (MOBIC) 7.5 MG tablet Take 1 tablet (7.5 mg total) by mouth daily. As needed for pain; take after eating (Patient taking differently: Take 7.5 mg by mouth daily as needed for pain.) 30 tablet 4   Multiple Vitamin (MULTIVITAMIN WITH MINERALS) TABS tablet Take 1 tablet by mouth daily.     Omega-3 Fatty Acids (OMEGA-3 PO) Take 1 capsule by mouth daily.     OXYGEN Inhale into the lungs at  bedtime as needed (2 L).     traMADol (ULTRAM) 50 MG tablet Take 100 mg by mouth 2 (two) times daily as needed (pain).     traZODone (DESYREL) 50 MG tablet Take 1 tablet (50 mg total) by mouth at bedtime. 30 tablet 1   No current facility-administered medications on file prior to visit.    Allergies  Allergen Reactions   Neurontin [Gabapentin] Other (See Comments)    Has un-controlled movement of her body, excessive dribble of saliva at night   Sulfa Antibiotics Nausea And Vomiting and Other (See Comments)    Stomach pain    Social History   Socioeconomic History   Marital status: Married    Spouse name: Not  on file   Number of children: Not on file   Years of education: Not on file   Highest education level: Not on file  Occupational History   Not on file  Tobacco Use   Smoking status: Never   Smokeless tobacco: Never  Vaping Use   Vaping Use: Never used  Substance and Sexual Activity   Alcohol use: Never   Drug use: Never   Sexual activity: Not Currently  Other Topics Concern   Not on file  Social History Narrative   Not on file   Social Determinants of Health   Financial Resource Strain: Not on file  Food Insecurity: No Food Insecurity (01/24/2022)   Hunger Vital Sign    Worried About Running Out of Food in the Last Year: Never true    Ran Out of Food in the Last Year: Never true  Transportation Needs: No Transportation Needs (01/24/2022)   PRAPARE - Administrator, Civil Service (Medical): No    Lack of Transportation (Non-Medical): No  Physical Activity: Not on file  Stress: Not on file  Social Connections: Not on file  Intimate Partner Violence: Not At Risk (01/24/2022)   Humiliation, Afraid, Rape, and Kick questionnaire    Fear of Current or Ex-Partner: No    Emotionally Abused: No    Physically Abused: No    Sexually Abused: No    Family History  Problem Relation Age of Onset   Atrial fibrillation Mother    Congestive Heart Failure  Father     Past Surgical History:  Procedure Laterality Date   HERNIA REPAIR     JOINT REPLACEMENT Left    knee    ROS: Review of Systems Negative except as stated above  PHYSICAL EXAM: There were no vitals taken for this visit.  Physical Exam  {female adult master:310786} {female adult master:310785}     Latest Ref Rng & Units 01/29/2022    1:14 AM 01/28/2022   12:58 AM 01/27/2022    2:10 AM  CMP  Glucose 70 - 99 mg/dL 161  096  045   BUN 8 - 23 mg/dL 18  21  26    Creatinine 0.44 - 1.00 mg/dL 4.09  8.11  9.14   Sodium 135 - 145 mmol/L 137  138  137   Potassium 3.5 - 5.1 mmol/L 4.1  4.3  3.8   Chloride 98 - 111 mmol/L 95  92  91   CO2 22 - 32 mmol/L 34  36  38   Calcium 8.9 - 10.3 mg/dL 78.2  95.6  21.3    Lipid Panel     Component Value Date/Time   CHOL 148 03/23/2019 0438   TRIG 59 03/23/2019 0438   HDL 59 03/23/2019 0438   CHOLHDL 2.5 03/23/2019 0438   VLDL 12 03/23/2019 0438   LDLCALC 77 03/23/2019 0438    CBC    Component Value Date/Time   WBC 8.1 01/29/2022 0114   RBC 3.81 (L) 01/29/2022 0114   HGB 11.1 (L) 01/29/2022 0114   HCT 35.6 (L) 01/29/2022 0114   PLT 217 01/29/2022 0114   MCV 93.4 01/29/2022 0114   MCH 29.1 01/29/2022 0114   MCHC 31.2 01/29/2022 0114   RDW 12.3 01/29/2022 0114   LYMPHSABS 1.5 08/08/2019 0752   MONOABS 0.8 08/08/2019 0752   EOSABS 0.2 08/08/2019 0752   BASOSABS 0.0 08/08/2019 0752    ASSESSMENT AND PLAN:  There are no diagnoses linked to this encounter.   Patient  was given the opportunity to ask questions.  Patient verbalized understanding of the plan and was able to repeat key elements of the plan. Patient was given clear instructions to go to Emergency Department or return to medical center if symptoms don't improve, worsen, or new problems develop.The patient verbalized understanding.   No orders of the defined types were placed in this encounter.    Requested Prescriptions    No prescriptions requested or  ordered in this encounter    No follow-ups on file.  [name], NP

## 2022-08-03 ENCOUNTER — Encounter: Payer: Medicare Other | Admitting: Family

## 2022-08-07 NOTE — Progress Notes (Signed)
Patient ID: Janice Ryan, female    DOB: [birth year]  MRN: [id number]  CC: Follow-Up  Subjective: Rayvon Espeland is a 67 y.o. female who presents for follow-up.   Her concerns today include:  07/21/2022 [facility] - Orthopedics per note: [facility] radiology. Patient has no showed for her MRI several times (8 times). They have removed the order and will not be rescheduling patient.   Patient No Show to Pulmonology - Asthma  Allergies   07/25/2022 per triage RN call note: Chief Complaint: knee pain Symptoms: R knee pain, comes and goes, difficulty walking and using WC, pain 8/10 Frequency: ongoing 3 years  Pertinent Negatives: NA Disposition: [] /[] Urgent Care (no appt availability in office) / [x] Appointment(In office/virtual)/ [] [place] [facility]/ [] Home Care/ [] Recommended Disposition /[] [facility]/ [] Follow-up with PCP Additional Notes: pt requesting to see Jamyia Fortune, NP again since she was her PCP previously. Pt states that she never seen Dr. [name] besides in the hospital when I tried to explain that pt was no longer considered pt with Pailynn Vahey, NP. Pt states her insurance has changed now since having [employer] and Medicaid. Pt takes Tylenol and Gabapentin to help with knee pain but has been in [facility] for quite some time d/t difficulty walking on RLE. Pt also stating she is suppose to be having MRI ordered and see's Dr. Case with Ortho for her knee. Scheduled OV 08/01/22 at 1420 with Caidence Kaseman, NP and advised her that if any issues with appt, office would give her a call. Pt also wanting to discuss asthma and allergies and also get set up to do physical as well. Advised she can discuss setting future appts up during OV. No further assistance needed.    Reason for Disposition  Knee pain is a chronic symptom (recurrent or ongoing AND present > 4 weeks)  Answer Assessment - Initial Assessment Questions 1. LOCATION and RADIATION: "Where is the  pain located?"      R knee  2. QUALITY: "What does the pain feel like?"  (e.g., sharp, dull, aching, burning)     Aching pain that getting worse  3. SEVERITY: "How bad is the pain?" "What does it keep you from doing?"   (Scale 1-10; or mild, moderate, severe)   -  MILD (1-3): doesn't interfere with normal activities    -  MODERATE (4-7): interferes with normal activities (e.g., work or school) or awakens from sleep, limping    -  SEVERE (8-10): excruciating pain, unable to do any normal activities, unable to walk     8/10 4. ONSET: "When did the pain start?" "Does it come and go, or is it there all the time?"     Ongoing 3 years  8. ASSOCIATED SYMPTOMS: "Is there any swelling or redness of the knee?"     no 9. OTHER SYMPTOMS: "Do you have any other symptoms?" (e.g., chest pain, difficulty breathing, fever, calf pain)     Difficulty walking  Protocols used: Knee Pain-A-AH  Today's visit 08/08/2022:   Patient Active Problem List   Diagnosis Date Noted   Suspected novel influenza A virus infection 02/01/2022   AKI (acute kidney injury) (HCC) 01/25/2022   Hypercalcemia 01/24/2022   Pneumonia due to infectious organism 01/23/2022   Acute on chronic respiratory failure with hypoxia and hypercapnia (HCC) 01/21/2022   Fall at home 01/21/2022   Poor social situation 01/21/2022   Major depressive disorder, recurrent episode, 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     albuterol (PROVENTIL) (2.5 MG/3ML) 0.083% nebulizer solution Take 3 mLs (2.5 mg total) by nebulization every 6 (six) hours as needed for wheezing or shortness of breath. 150 mL 2    albuterol (VENTOLIN HFA) 108 (90 Base) MCG/ACT inhaler TAKE 2 PUFFS BY MOUTH EVERY 6 HOURS AS NEEDED FOR WHEEZE OR SHORTNESS OF BREATH 8.5 each 2   albuterol (VENTOLIN HFA) 108 (90 Base) MCG/ACT inhaler Inhale 2 puffs into the lungs every 6 (six) hours as needed for wheezing or shortness of breath. 18 g 2   amphetamine-dextroamphetamine (ADDERALL XR) 20 MG 24 hr capsule Take 20 mg by mouth daily.     busPIRone (BUSPAR) 5 MG tablet TAKE ONE TABLET BY MOUTH THREE TIMES A DAY (Patient taking differently: Take 5 mg by mouth 3 (three) times daily.) 90 tablet 1   carbamide peroxide (DEBROX) 6.5 % OTIC solution Place 2-4 drops into both ears 2 (two) times daily as needed (to clean ears).     cetirizine (ZYRTEC ALLERGY) 10 MG tablet Take 1 tablet (10 mg total) by mouth daily. (Patient taking differently: Take 10 mg by mouth daily as needed for allergies.) 30 tablet 11   Cholecalciferol (VITAMIN D3 PO) Take 1 tablet by mouth daily.     diclofenac Sodium (VOLTAREN) 1 % GEL Apply 2 g topically 4 (four) times daily as needed. 350 g 0   DULoxetine (CYMBALTA) 60 MG capsule Take 1 capsule (60 mg total) by mouth daily. (Patient taking differently: Take 120 mg by mouth daily.) 30 capsule 2   fluticasone (FLONASE) 50 MCG/ACT nasal spray Place 2 sprays into both nostrils daily.  2   fluticasone-salmeterol (ADVAIR) 500-50 MCG/ACT AEPB Inhale 1 puff into the lungs in the morning and at bedtime. 60 each 2   gabapentin (NEURONTIN) 300 MG capsule Take 1 capsule (300 mg total) by mouth at bedtime. 30 capsule 0   meloxicam (MOBIC) 7.5 MG tablet Take 1 tablet (7.5 mg total) by mouth daily. As needed for pain; take after eating (Patient taking differently: Take 7.5 mg by mouth daily as needed for pain.) 30 tablet 4   Multiple Vitamin (MULTIVITAMIN WITH MINERALS) TABS tablet Take 1 tablet by mouth daily.     Omega-3 Fatty Acids (OMEGA-3 PO) Take 1 capsule by mouth daily.     OXYGEN Inhale into the lungs at bedtime as needed (2  L).     traMADol (ULTRAM) 50 MG tablet Take 100 mg by mouth 2 (two) times daily as needed (pain).     traZODone (DESYREL) 50 MG tablet Take 1 tablet (50 mg total) by mouth at bedtime. 30 tablet 1   No current facility-administered medications on file prior to visit.    Allergies  Allergen Reactions   Neurontin [Gabapentin] Other (See Comments)    Has un-controlled movement of her body, excessive dribble of saliva at night   Sulfa Antibiotics Nausea And Vomiting and Other (See Comments)    Stomach pain    Social History   Socioeconomic History   Marital status: Married    Spouse name: Not on file   Number of children: Not on file  Years of education: Not on file   Highest education level: Not on file  Occupational History   Not on file  Tobacco Use   Smoking status: Never   Smokeless tobacco: Never  Vaping Use   Vaping Use: Never used  Substance and Sexual Activity   Alcohol use: Never   Drug use: Never   Sexual activity: Not Currently  Other Topics Concern   Not on file  Social History Narrative   Not on file   Social Determinants of Health   Financial Resource Ryan: Not on file  Food Insecurity: No Food Insecurity (01/24/2022)   Hunger Vital Sign    Worried About Running Out of Food in the Last Year: Never true    Ran Out of Food in the Last Year: Never true  Transportation Needs: No Transportation Needs (01/24/2022)   PRAPARE - Administrator, Civil Service (Medical): No    Lack of Transportation (Non-Medical): No  Physical Activity: Not on file  Stress: Not on file  Social Connections: Not on file  Intimate Partner Violence: Not At Risk (01/24/2022)   Humiliation, Afraid, Rape, and Kick questionnaire    Fear of Current or Ex-Partner: No    Emotionally Abused: No    Physically Abused: No    Sexually Abused: No    Family History  Problem Relation Age of Onset   Atrial fibrillation Mother    Congestive Heart Failure Father     Past  Surgical History:  Procedure Laterality Date   HERNIA REPAIR     JOINT REPLACEMENT Left    knee    ROS: Review of Systems Negative except as stated above  PHYSICAL EXAM: There were no vitals taken for this visit.  Physical Exam  {female adult master:310786} {female adult master:310785}     Latest Ref Rng & Units 01/29/2022    1:14 AM 01/28/2022   12:58 AM 01/27/2022    2:10 AM  CMP  Glucose 70 - 99 mg/dL 161  096  045   BUN 8 - 23 mg/dL 18  21  26    Creatinine 0.44 - 1.00 mg/dL 4.09  8.11  9.14   Sodium 135 - 145 mmol/L 137  138  137   Potassium 3.5 - 5.1 mmol/L 4.1  4.3  3.8   Chloride 98 - 111 mmol/L 95  92  91   CO2 22 - 32 mmol/L 34  36  38   Calcium 8.9 - 10.3 mg/dL 78.2  95.6  21.3    Lipid Panel     Component Value Date/Time   CHOL 148 03/23/2019 0438   TRIG 59 03/23/2019 0438   HDL 59 03/23/2019 0438   CHOLHDL 2.5 03/23/2019 0438   VLDL 12 03/23/2019 0438   LDLCALC 77 03/23/2019 0438    CBC    Component Value Date/Time   WBC 8.1 01/29/2022 0114   RBC 3.81 (L) 01/29/2022 0114   HGB 11.1 (L) 01/29/2022 0114   HCT 35.6 (L) 01/29/2022 0114   PLT 217 01/29/2022 0114   MCV 93.4 01/29/2022 0114   MCH 29.1 01/29/2022 0114   MCHC 31.2 01/29/2022 0114   RDW 12.3 01/29/2022 0114   LYMPHSABS 1.5 08/08/2019 0752   MONOABS 0.8 08/08/2019 0752   EOSABS 0.2 08/08/2019 0752   BASOSABS 0.0 08/08/2019 0752    ASSESSMENT AND PLAN:  There are no diagnoses linked to this encounter.   Patient was given the opportunity to ask questions.  Patient verbalized understanding of  the plan and was able to repeat key elements of the plan. Patient was given clear instructions to go to Emergency Department or return to medical center if symptoms don't improve, worsen, or new problems develop.The patient verbalized understanding.   No orders of the defined types were placed in this encounter.    Requested Prescriptions    No prescriptions requested or ordered in this  encounter    No follow-ups on file.  [name], NP

## 2022-08-08 ENCOUNTER — Encounter: Payer: Medicare Other | Admitting: Family

## 2022-08-09 NOTE — Progress Notes (Deleted)
Patient ID: Janice Ryan, female    DOB: 13-Sep-1955  MRN: [id number]  CC: Follow-Up  Subjective: Janice Ryan is a 67 y.o. female who presents for follow-up.   Her concerns today include:  07/21/2022 [facility] - Orthopedics per note: [facility] radiology. Patient has no showed for her MRI several times (8 times). They have removed the order and will not be rescheduling patient.   Patient No Show to Pulmonology - Asthma  Allergies   07/25/2022 per triage RN call note: Chief Complaint: knee pain Symptoms: R knee pain, comes and goes, difficulty walking and using WC, pain 8/10 Frequency: ongoing 3 years  Pertinent Negatives: NA Disposition: [] /[] Care (no appt availability in office) / [x] t(In office/virtual)/ [] [facility]/ [] Care/ [] Refused Recommended Disposition /[] [place] [facility]/ [] Follow-up with PCP Additional Notes: pt requesting to see Kaisyn Millea, NP again since she was her PCP previously. Pt states that she never seen Dr. [name] besides in the hospital when I tried to explain that pt was no longer considered pt with Keanu Frickey, NP. Pt states her insurance has changed now since having [employer] and Medicaid. Pt takes Tylenol and Gabapentin to help with knee pain but has been in [facility] for quite some time d/t difficulty walking on RLE. Pt also stating she is suppose to be having MRI ordered and see's Dr. Case with Ortho for her knee. Scheduled OV 08/01/22 at 1420 with Bob Daversa, NP and advised her that if any issues with appt, office would give her a call. Pt also wanting to discuss asthma and allergies and also get set up to do physical as well. Advised she can discuss setting future appts up during OV. No further assistance needed.    Reason for Disposition  Knee pain is a chronic symptom (recurrent or ongoing AND present > 4 weeks)  Answer Assessment - Initial Assessment Questions 1. LOCATION and RADIATION: "Where is the pain  located?"      R knee  2. QUALITY: "What does the pain feel like?"  (e.g., sharp, dull, aching, burning)     Aching pain that getting worse  3. SEVERITY: "How bad is the pain?" "What does it keep you from doing?"   (Scale 1-10; or mild, moderate, severe)   -  MILD (1-3): doesn't interfere with normal activities    -  MODERATE (4-7): interferes with normal activities (e.g., work or school) or awakens from sleep, limping    -  SEVERE (8-10): excruciating pain, unable to do any normal activities, unable to walk     8/10 4. ONSET: "When did the pain start?" "Does it come and go, or is it there all the time?"     Ongoing 3 years  8. ASSOCIATED SYMPTOMS: "Is there any swelling or redness of the knee?"     no 9. 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     albuterol (PROVENTIL) (2.5 MG/3ML) 0.083% nebulizer solution Take 3 mLs (2.5 mg total) by nebulization every 6 (six) hours as needed for wheezing or shortness of breath. 150 mL 2   albuterol (VENTOLIN HFA) 108 (90 Base)  MCG/ACT inhaler TAKE 2 PUFFS BY MOUTH EVERY 6 HOURS AS NEEDED FOR WHEEZE OR SHORTNESS OF BREATH 8.5 each 2   albuterol (VENTOLIN HFA) 108 (90 Base) MCG/ACT inhaler Inhale 2 puffs into the lungs every 6 (six) hours as needed for wheezing or shortness of breath. 18 g 2   amphetamine-dextroamphetamine (ADDERALL XR) 20 MG 24 hr capsule Take 20 mg by mouth daily.     busPIRone (BUSPAR) 5 MG tablet TAKE ONE TABLET BY MOUTH THREE TIMES A DAY (Patient taking differently: Take 5 mg by mouth 3 (three) times daily.) 90 tablet 1   carbamide peroxide (DEBROX) 6.5 % OTIC solution Place 2-4 drops into both ears 2 (two) times daily as needed (to clean ears).     cetirizine (ZYRTEC ALLERGY) 10 MG tablet Take 1 tablet (10 mg total) by mouth daily. (Patient taking differently: Take 10 mg by mouth daily as needed for allergies.) 30 tablet 11   Cholecalciferol (VITAMIN D3 PO) Take 1 tablet by mouth daily.     diclofenac Sodium (VOLTAREN) 1 % GEL Apply 2 g topically 4 (four) times daily as needed. 350 g 0   DULoxetine (CYMBALTA) 60 MG capsule Take 1 capsule (60 mg total) by mouth daily. (Patient taking differently: Take 120 mg by mouth daily.) 30 capsule 2   fluticasone (FLONASE) 50 MCG/ACT nasal spray Place 2 sprays into both nostrils daily.  2   fluticasone-salmeterol (ADVAIR) 500-50 MCG/ACT AEPB Inhale 1 puff into the lungs in the morning and at bedtime. 60 each 2   gabapentin (NEURONTIN) 300 MG capsule Take 1 capsule (300 mg total) by mouth at bedtime. 30 capsule 0   meloxicam (MOBIC) 7.5 MG tablet Take 1 tablet (7.5 mg total) by mouth daily. As needed for pain; take after eating (Patient taking differently: Take 7.5 mg by mouth daily as needed for pain.) 30 tablet 4   Multiple Vitamin (MULTIVITAMIN WITH MINERALS) TABS tablet Take 1 tablet by mouth daily.     Omega-3 Fatty Acids (OMEGA-3 PO) Take 1 capsule by mouth daily.     OXYGEN Inhale into the lungs at bedtime as needed (2 L).     traMADol (ULTRAM) 50 MG tablet  Take 100 mg by mouth 2 (two) times daily as needed (pain).     traZODone (DESYREL) 50 MG tablet Take 1 tablet (50 mg total) by mouth at bedtime. 30 tablet 1   No current facility-administered medications on file prior to visit.    Allergies  Allergen Reactions   Neurontin [Gabapentin] Other (See Comments)    Has un-controlled movement of her body, excessive dribble of saliva at night   Sulfa Antibiotics Nausea And Vomiting and Other (See Comments)    Stomach pain    Social History   Socioeconomic History   Marital status: Married    Spouse name: Not on file   Number of children: Not on file   Years  of education: Not on file   Highest education level: Not on file  Occupational History   Not on file  Tobacco Use   Smoking status: Never   Smokeless tobacco: Never  Vaping Use   Vaping Use: Never used  Substance and Sexual Activity   Alcohol use: Never   Drug use: Never   Sexual activity: Not Currently  Other Topics Concern   Not on file  Social History Narrative   Not on file   Social Determinants of Health   Financial Resource Strain: Not on file  Food Insecurity: No Food Insecurity (01/24/2022)   Hunger Vital Sign    Worried About Running Out of Food in the Last Year: Never true    Ran Out of Food in the Last Year: Never true  Transportation Needs: No Transportation Needs (01/24/2022)   PRAPARE - Administrator, Civil Service (Medical): No    Lack of Transportation (Non-Medical): No  Physical Activity: Not on file  Stress: Not on file  Social Connections: Not on file  Intimate Partner Violence: Not At Risk (01/24/2022)   Humiliation, Afraid, Rape, and Kick questionnaire    Fear of Current or Ex-Partner: No    Emotionally Abused: No    Physically Abused: No    Sexually Abused: No    Family History  Problem Relation Age of Onset   Atrial fibrillation Mother    Congestive Heart Failure Father     Past Surgical History:  Procedure Laterality  Date   HERNIA REPAIR     JOINT REPLACEMENT Left    knee    ROS: Review of Systems Negative except as stated above  PHYSICAL EXAM: There were no vitals taken for this visit.  Physical Exam  {female adult master:310786} {female adult master:310785}     Latest Ref Rng & Units 01/29/2022    1:14 AM 01/28/2022   12:58 AM 01/27/2022    2:10 AM  CMP  Glucose 70 - 99 mg/dL 119  147  829   BUN 8 - 23 mg/dL 18  21  26    Creatinine 0.44 - 1.00 mg/dL 5.62  1.30  8.65   Sodium 135 - 145 mmol/L 137  138  137   Potassium 3.5 - 5.1 mmol/L 4.1  4.3  3.8   Chloride 98 - 111 mmol/L 95  92  91   CO2 22 - 32 mmol/L 34  36  38   Calcium 8.9 - 10.3 mg/dL 78.4  69.6  29.5    Lipid Panel     Component Value Date/Time   CHOL 148 03/23/2019 0438   TRIG 59 03/23/2019 0438   HDL 59 03/23/2019 0438   CHOLHDL 2.5 03/23/2019 0438   VLDL 12 03/23/2019 0438   LDLCALC 77 03/23/2019 0438    CBC    Component Value Date/Time   WBC 8.1 01/29/2022 0114   RBC 3.81 (L) 01/29/2022 0114   HGB 11.1 (L) 01/29/2022 0114   HCT 35.6 (L) 01/29/2022 0114   PLT 217 01/29/2022 0114   MCV 93.4 01/29/2022 0114   MCH 29.1 01/29/2022 0114   MCHC 31.2 01/29/2022 0114   RDW 12.3 01/29/2022 0114   LYMPHSABS 1.5 08/08/2019 0752   MONOABS 0.8 08/08/2019 0752   EOSABS 0.2 08/08/2019 0752   BASOSABS 0.0 08/08/2019 0752    ASSESSMENT AND PLAN:  There are no diagnoses linked to this encounter.   Patient was given the opportunity to ask questions.  Patient verbalized understanding of the  plan and was able to repeat key elements of the plan. Patient was given clear instructions to go to Emergency Department or return to medical center if symptoms don't improve, worsen, or new problems develop.The patient verbalized understanding.   No orders of the defined types were placed in this encounter.    Requested Prescriptions    No prescriptions requested or ordered in this encounter    No follow-ups on  file.  [name], NP

## 2022-08-10 NOTE — Progress Notes (Signed)
Appt cancelled

## 2022-08-15 ENCOUNTER — Ambulatory Visit: Payer: Self-pay | Admitting: *Deleted

## 2022-08-15 ENCOUNTER — Ambulatory Visit: Payer: Medicare Other | Admitting: Family

## 2022-08-15 NOTE — Telephone Encounter (Signed)
Summary: Question Sinus issues   Stuffy nose, headache, phlegm,  rescheduled today's appt due to weather         Third attempt to reach pt. Left VM to call back each attempt. Routing to practice for PCPs review and resolution per protocol.

## 2022-08-15 NOTE — Telephone Encounter (Signed)
Patient called, left VM to return the call to the office to discuss symptoms with a nurse.  Summary: Question Sinus issues   Stuffy nose, headache, phlegm,  rescheduled today's appt due to weather

## 2022-08-15 NOTE — Telephone Encounter (Signed)
Question Sinus issues   Stuffy nose, headache, phlegm,  rescheduled today's appt due to weather

## 2022-08-16 NOTE — Progress Notes (Signed)
Erroneous encounter-disregard

## 2022-08-18 ENCOUNTER — Telehealth: Payer: Self-pay | Admitting: Primary Care

## 2022-08-18 ENCOUNTER — Encounter: Payer: Medicare Other | Admitting: Family

## 2022-08-18 NOTE — Telephone Encounter (Signed)
FYI

## 2022-08-18 NOTE — Telephone Encounter (Signed)
Copied from CRM [id number]. Topic: General - Other >> Aug 18, 2022  8:53 AM Carrielelia G wrote: Reason for CRM: patient would like to change her appt to a virtual appt., unable to get transportation Please call and advise patient

## 2022-08-18 NOTE — Telephone Encounter (Signed)
Pt calling back because she never got call regarding her appt getting changed to virtual. Pt states that her transportation cancelled and was unable to come. Pt is asking if referral for Pulmonology can be resent since they advised her current one had been closed and also pt asking if she able to get something different than gabapentin for knee pain. Pt has upcoming appt on Tuesday and asking if something can be sent prior to then. Advised pt I would send to provider and have nurse FU if possible.

## 2022-08-18 NOTE — Telephone Encounter (Signed)
Please schedule appointment.

## 2022-08-18 NOTE — Telephone Encounter (Signed)
Will forward to correct office.

## 2022-08-22 ENCOUNTER — Telehealth: Payer: Self-pay | Admitting: Family

## 2022-08-22 ENCOUNTER — Encounter: Payer: Self-pay | Admitting: Family

## 2022-08-22 ENCOUNTER — Ambulatory Visit: Payer: Medicare Other | Admitting: Family

## 2022-08-22 ENCOUNTER — Other Ambulatory Visit: Payer: Self-pay | Admitting: Family

## 2022-08-22 ENCOUNTER — Ambulatory Visit: Payer: Self-pay | Admitting: *Deleted

## 2022-08-22 NOTE — Telephone Encounter (Signed)
Created in error

## 2022-08-22 NOTE — Telephone Encounter (Signed)
  Chief Complaint: patient is requesting virtual appointment - she wants appointment today- and has in person appointment next week Symptoms: knee pain- R Frequency: chronic pain- possible untreated injury- patient is requesting medication   Disposition: [] ED /[] Urgent Care (no appt availability in office) / [] Appointment(In office/virtual)/ []  [place] [facility]/ [] Home Care/ [x] Refused Recommended Disposition /[] [facility] [facility]/ []  Follow-up with PCP Additional Notes: Patient is requesting virtual appointment today due to severe pain- she states she needs pain medication refills. Patient states she will come to ofice appointment next week. Patient advised the office is closed for lunch. I will send request- but it may not be addressed until after lunch. Patient advise of location of mobile unit.  Reason for Disposition . [1] SEVERE pain (e.g., excruciating, unable to walk) AND [2] not improved after 2 hours of pain medicine  Answer Assessment - Initial Assessment Questions 1. LOCATION and RADIATION: "Where is the pain located?"      R knee pain 2. QUALITY: "What does the pain feel like?"  (e.g., sharp, dull, aching, burning)     Excruciating- out of medication- gabapentin , tramadol  3. SEVERITY: "How bad is the pain?" "What does it keep you from doing?"   (Scale 1-10; or mild, moderate, severe)   -  MILD (1-3): doesn't interfere with normal activities    -  MODERATE (4-7): interferes with normal activities (e.g., work or school) or awakens from sleep, limping    -  SEVERE (8-10): excruciating pain, unable to do any normal activities, unable to walk     Severe-patient is requesting virtual visit for pain 4. ONSET: "When did the pain start?" "Does it come and go, or is it there all the time?"     Chronic knee pain- patient is requesting virtual so she can get pain medication  5. RECURRENT: "Have you had this pain before?" If Yes, ask: "When, and what happened then?"     Patient  is in wheel chair and has chronic pain- patient had been seeing pain management in the past- 2 years ago- but her insurance changes and she was unable to continue care 6. SETTING: "Has there been any recent work, exercise or other activity that involved that part of the body?"     Previous injury  Protocols used: Knee Pain-A-AH

## 2022-08-22 NOTE — Telephone Encounter (Signed)
Medication Refill - Medication: gabapentin (NEURONTIN) 300 MG capsule traMADol (ULTRAM) 50 MG tablet Lyrica 50 mg   Has the patient contacted their pharmacy? No.  Preferred Pharmacy (with phone number or street name):  [id number] [address] [address], [place] [address] Phone: [phone] Fax: [phone]  Has the patient been seen for an appointment in the last year OR does the patient have an upcoming appointment? Yes.    Agent: Please be advised that RX refills may take up to 3 business days. We ask that you follow-up with your pharmacy.

## 2022-08-22 NOTE — Telephone Encounter (Signed)
This encounter was created in error - please disregard.

## 2022-08-22 NOTE — Telephone Encounter (Signed)
Called pt and left voicemail to schedule with [name] Zonia Kief.

## 2022-08-22 NOTE — Telephone Encounter (Signed)
Patient called, left VM to return the call to the office. Wanted to discuss request for Lyrica, not on current or past medication list. Patient did speak to NT regarding knee pain today, see nurse triage encounter.

## 2022-08-22 NOTE — Telephone Encounter (Signed)
Called pt back multiple times to speak with her about cancelled appointment, pt didn't pick up left vm for pt stating her appointment was cancelled on 5:17 with admin.Let pt know she can call the office back to speak with one of [place] to get this resolved.

## 2022-08-22 NOTE — Telephone Encounter (Signed)
Requested medication (s) are due for refill today: Gabapentin due 08/31/22   Amount not specified for Tramadol  Requested medication (s) are on the active medication list: yes  Last refill: Gabapentin  08/01/22  #30  0 refills    Tramadol   01/22/22  Future visit scheduled yes  08/30/22  Notes to clinic:Not delegated, historical providers,please review. Thank you.  Requested Prescriptions  Pending Prescriptions Disp Refills   gabapentin (NEURONTIN) 300 MG capsule 30 capsule 0    Sig: Take 1 capsule (300 mg total) by mouth at bedtime.     Neurology: Anticonvulsants - gabapentin Failed - 08/22/2022  2:59 PM      Failed - Cr in normal range and within 360 days    Creatinine, Ser  Date Value Ref Range Status  01/29/2022 1.04 (H) 0.44 - 1.00 mg/dL Final         Failed - Completed PHQ-2 or PHQ-9 in the last 360 days      Failed - Valid encounter within last 12 months    Recent Outpatient Visits           7 months ago Chronic frontal sinusitis   [place] [facility], [name] J, NP   1 year ago Chronic frontal sinusitis   [place] [facility], [name] J, NP   1 year ago Acute sinusitis, recurrence not specified, unspecified location   [facility], Kasandra Knudsen, PA-C   1 year ago Erroneous encounter - disregard   [place] [facility], Kasandra Knudsen, PA-C   1 year ago Erroneous encounter - disregard   [place] [facility], Kasandra Knudsen, PA-C       Future Appointments             In 1 week [name], NP [facility]             traMADol (ULTRAM) 50 MG tablet 30 tablet     Sig: Take 2 tablets (100 mg total) by mouth 2 (two) times daily as needed (pain).     Not Delegated - Analgesics:  Opioid Agonists Failed - 08/22/2022  2:59 PM      Failed - This refill cannot be delegated      Failed - Urine Drug Screen  completed in last 360 days      Failed - Valid encounter within last 3 months    Recent Outpatient Visits           7 months ago Chronic frontal sinusitis   [place] [facility], [place], NP   1 year ago Chronic frontal sinusitis   [place] [facility], [name] J, NP   1 year ago Acute sinusitis, recurrence not specified, unspecified location   [facility], Kasandra Knudsen, PA-C   1 year ago Erroneous encounter - disregard   [place] [facility], Kasandra Knudsen, PA-C   1 year ago Erroneous encounter - disregard    Primary Care at [facility], Kasandra Knudsen, [place]       Future Appointments             In 1 week [name], NP [facility]

## 2022-08-22 NOTE — Telephone Encounter (Signed)
Patient states she did not cancel the virtual visit she scheduled- she wants to keep it- advised I would forward her information.

## 2022-08-23 ENCOUNTER — Telehealth: Payer: Self-pay

## 2022-08-23 ENCOUNTER — Telehealth: Payer: Self-pay | Admitting: Family

## 2022-08-23 NOTE — Telephone Encounter (Signed)
This request was already routed to the provider in another encounter on yesterday. This is a duplicate call, will close this encounter.

## 2022-08-23 NOTE — Telephone Encounter (Signed)
Copied from CRM [id number]. Topic: General - Inquiry >> Aug 23, 2022  9:37 AM De Blanch wrote: Reason for CRM: Pt is calling to f/u on conversation with NT from May 20. Pt requesting a callback from the nurse.  Please advise.

## 2022-08-23 NOTE — Telephone Encounter (Signed)
Medication Refill - Medication: gabapentin (NEURONTIN) 300 MG capsule   Has the patient contacted their pharmacy? No. No, more refills.   (Agent: If no, request that the patient contact the pharmacy for the refill. If patient does not wish to contact the pharmacy document the reason why and proceed with request.)   Preferred Pharmacy (with phone number or street name):  [id number] [address], [place] - [address]  [address] [place] [address]  Phone: [phone] Fax: [phone]  Hours: Not open 24 hours   Has the patient been seen for an appointment in the last year OR does the patient have an upcoming appointment? Yes.    Agent: Please be advised that RX refills may take up to 3 business days. We ask that you follow-up with your pharmacy.

## 2022-08-23 NOTE — Progress Notes (Deleted)
Patient ID: Janice Ryan, female    DOB: 09-Nov-1955  MRN: [id number]  CC: Referrals  Subjective: Janice Ryan is a 67 y.o. female who presents for referrals.  Her concerns today include:  Requests referrals for asthma and Orthopedics.   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     albuterol (PROVENTIL) (2.5 MG/3ML) 0.083% nebulizer solution Take 3 mLs (2.5 mg total) by nebulization every 6 (six) hours as needed for wheezing or shortness of breath. 150 mL 2   albuterol (VENTOLIN HFA) 108 (90 Base) MCG/ACT inhaler TAKE 2 PUFFS BY MOUTH EVERY 6 HOURS AS NEEDED FOR WHEEZE OR SHORTNESS OF BREATH 8.5 each 2   albuterol (VENTOLIN HFA) 108 (90 Base) MCG/ACT inhaler Inhale 2 puffs into the lungs every 6 (six) hours as needed for wheezing or shortness of breath. 18 g 2   amphetamine-dextroamphetamine (ADDERALL XR) 20 MG 24 hr capsule Take 20 mg by mouth daily.     busPIRone (BUSPAR) 5 MG tablet TAKE ONE TABLET BY MOUTH THREE TIMES A DAY (Patient taking differently: Take 5 mg by mouth 3 (three) times daily.) 90 tablet 1   carbamide peroxide (DEBROX) 6.5 % OTIC solution Place 2-4 drops into both ears 2 (two) times daily as  needed (to clean ears).     cetirizine (ZYRTEC ALLERGY) 10 MG tablet Take 1 tablet (10 mg total) by mouth daily. (Patient taking differently: Take 10 mg by mouth daily as needed for allergies.) 30 tablet 11   Cholecalciferol (VITAMIN D3 PO) Take 1 tablet by mouth daily.     diclofenac Sodium (VOLTAREN) 1 % GEL Apply 2 g topically 4 (four) times daily as needed. 350 g 0   DULoxetine (CYMBALTA) 60 MG capsule Take 1 capsule (60 mg total) by mouth daily. (Patient taking differently: Take 120 mg by mouth daily.) 30 capsule 2   fluticasone (FLONASE) 50 MCG/ACT nasal spray Place 2 sprays into both nostrils daily.  2   fluticasone-salmeterol (ADVAIR) 500-50 MCG/ACT AEPB Inhale 1 puff into the lungs in the morning and at bedtime. 60 each 2   gabapentin (NEURONTIN) 300 MG capsule Take 1 capsule (300 mg total) by mouth at bedtime. 30 capsule 0   meloxicam (MOBIC) 7.5 MG tablet Take 1 tablet (7.5 mg total) by mouth daily. As needed for pain; take after eating (Patient taking differently: Take 7.5 mg by mouth daily as needed for pain.) 30 tablet 4   Multiple Vitamin (MULTIVITAMIN WITH MINERALS) TABS tablet Take 1 tablet by mouth daily.     Omega-3 Fatty Acids (OMEGA-3 PO) Take 1 capsule by mouth daily.     OXYGEN Inhale into the lungs at bedtime as needed (2 L).     traMADol (ULTRAM) 50 MG  tablet Take 100 mg by mouth 2 (two) times daily as needed (pain).     traZODone (DESYREL) 50 MG tablet Take 1 tablet (50 mg total) by mouth at bedtime. 30 tablet 1   No current facility-administered medications on file prior to visit.    Allergies  Allergen Reactions   Neurontin [Gabapentin] Other (See Comments)    Has un-controlled movement of her body, excessive dribble of saliva at night   Sulfa Antibiotics Nausea And Vomiting and Other (See Comments)    Stomach pain    Social History   Socioeconomic History   Marital status: Married    Spouse name: Not on file   Number of children: Not on file   Years of  education: Not on file   Highest education level: Not on file  Occupational History   Not on file  Tobacco Use   Smoking status: Never   Smokeless tobacco: Never  Vaping Use   Vaping Use: Never used  Substance and Sexual Activity   Alcohol use: Never   Drug use: Never   Sexual activity: Not Currently  Other Topics Concern   Not on file  Social History Narrative   Not on file   Social Determinants of Health   Financial Resource Strain: Not on file  Food Insecurity: No Food Insecurity (01/24/2022)   Hunger Vital Sign    Worried About Running Out of Food in the Last Year: Never true    Ran Out of Food in the Last Year: Never true  Transportation Needs: No Transportation Needs (01/24/2022)   PRAPARE - Administrator, Civil Service (Medical): No    Lack of Transportation (Non-Medical): No  Physical Activity: Not on file  Stress: Not on file  Social Connections: Not on file  Intimate Partner Violence: Not At Risk (01/24/2022)   Humiliation, Afraid, Rape, and Kick questionnaire    Fear of Current or Ex-Partner: No    Emotionally Abused: No    Physically Abused: No    Sexually Abused: No    Family History  Problem Relation Age of Onset   Atrial fibrillation Mother    Congestive Heart Failure Father     Past Surgical History:  Procedure Laterality Date   HERNIA REPAIR     JOINT REPLACEMENT Left    knee    ROS: Review of Systems Negative except as stated above  PHYSICAL EXAM: There were no vitals taken for this visit.  Physical Exam  {female adult master:310786} {female adult master:310785}     Latest Ref Rng & Units 01/29/2022    1:14 AM 01/28/2022   12:58 AM 01/27/2022    2:10 AM  CMP  Glucose 70 - 99 mg/dL 161  096  045   BUN 8 - 23 mg/dL 18  21  26    Creatinine 0.44 - 1.00 mg/dL 4.09  8.11  9.14   Sodium 135 - 145 mmol/L 137  138  137   Potassium 3.5 - 5.1 mmol/L 4.1  4.3  3.8   Chloride 98 - 111 mmol/L 95  92  91   CO2 22 - 32 mmol/L 34   36  38   Calcium 8.9 - 10.3 mg/dL 78.2  95.6  21.3    Lipid Panel     Component Value Date/Time   CHOL 148 03/23/2019 0438   TRIG 59 03/23/2019 0438   HDL 59 03/23/2019 0438   CHOLHDL 2.5 03/23/2019 0438   VLDL 12 03/23/2019 0438   LDLCALC  77 03/23/2019 0438    CBC    Component Value Date/Time   WBC 8.1 01/29/2022 0114   RBC 3.81 (L) 01/29/2022 0114   HGB 11.1 (L) 01/29/2022 0114   HCT 35.6 (L) 01/29/2022 0114   PLT 217 01/29/2022 0114   MCV 93.4 01/29/2022 0114   MCH 29.1 01/29/2022 0114   MCHC 31.2 01/29/2022 0114   RDW 12.3 01/29/2022 0114   LYMPHSABS 1.5 08/08/2019 0752   MONOABS 0.8 08/08/2019 0752   EOSABS 0.2 08/08/2019 0752   BASOSABS 0.0 08/08/2019 0752    ASSESSMENT AND PLAN:  There are no diagnoses linked to this encounter.   Patient was given the opportunity to ask questions.  Patient verbalized understanding of the plan and was able to repeat key elements of the plan. Patient was given clear instructions to go to Emergency Department or return to medical center if symptoms don't improve, worsen, or new problems develop.The patient verbalized understanding.   No orders of the defined types were placed in this encounter.    Requested Prescriptions    No prescriptions requested or ordered in this encounter    No follow-ups on file.  [name], NP

## 2022-08-23 NOTE — Telephone Encounter (Signed)
done

## 2022-08-30 ENCOUNTER — Other Ambulatory Visit: Payer: Self-pay | Admitting: *Deleted

## 2022-08-30 ENCOUNTER — Ambulatory Visit: Payer: Medicare Other | Admitting: Family

## 2022-08-30 NOTE — Telephone Encounter (Signed)
  Chief Complaint: Medication refills Symptoms: NA Frequency: NA Pertinent Negatives: Patient denies NA Disposition: [] ED /[] Urgent Care (no appt availability in office) / [] Appointment(In office/virtual)/ []  [place] [facility]/ [] Home Care/ [] Refused Recommended Disposition /[] [place] [facility]/ []  Follow-up with PCP Additional Notes:  Calling for med refills, sent to [facility] refill pool for review. Requesting referral to ENT or "Allergist." States seasonal allergies are "Getting bad."   Pt has appt 09/04/22

## 2022-08-30 NOTE — Telephone Encounter (Signed)
Summary: Pt requests that a nurse call her back regarding some medications   Pt requests that a nurse call her back regarding some medications. Cb#  [phone]       Called patient [phone] to review medication questions. No answer, LVMTCB [phone].

## 2022-08-31 NOTE — Telephone Encounter (Signed)
Requested medication (s) are due for refill today: Yes, all  Requested medication (s) are on the active medication list: yes, all    Last refill: Flonase  02/20/22  ?quantity      Gabapentin  08/01/22  #30  0 refills      Voltaren Gel  04/02/2019  350g  0 refills  Future visit scheduled Yes  09/04/22  Notes to clinic:Historical providers, please review. Thank you.  Requested Prescriptions  Pending Prescriptions Disp Refills   fluticasone (FLONASE) 50 MCG/ACT nasal spray  2    Sig: Place 2 sprays into both nostrils daily.     Ear, Nose, and Throat: Nasal Preparations - Corticosteroids Failed - 08/30/2022  2:21 PM      Failed - Valid encounter within last 12 months    Recent Outpatient Visits           7 months ago Chronic frontal sinusitis   [place] [facility], [place], [place]   1 year ago Chronic frontal sinusitis   [place] [facility], [name] J, NP   1 year ago Acute sinusitis, recurrence not specified, unspecified location   [facility], Kasandra Knudsen, PA-C   1 year ago Erroneous encounter - disregard   [place] [facility], Kasandra Knudsen, PA-C   1 year ago Erroneous encounter - disregard   [place] [facility], Cari S, PA-C       Future Appointments             In 4 days [name], NP [facility]             gabapentin (NEURONTIN) 300 MG capsule 30 capsule 0    Sig: Take 1 capsule (300 mg total) by mouth at bedtime.     Neurology: Anticonvulsants - gabapentin Failed - 08/30/2022  2:21 PM      Failed - Cr in normal range and within 360 days    Creatinine, Ser  Date Value Ref Range Status  01/29/2022 1.04 (H) 0.44 - 1.00 mg/dL Final         Failed - Completed PHQ-2 or PHQ-9 in the last 360 days      Failed - Valid encounter within last 12 months    Recent Outpatient Visits           7  months ago Chronic frontal sinusitis   [place] [facility], [place], NP   1 year ago Chronic frontal sinusitis   [place] [facility], [name] J, NP   1 year ago Acute sinusitis, recurrence not specified, unspecified location   [facility], Kasandra Knudsen, PA-C   1 year ago Erroneous encounter - disregard   [place] [facility], Kasandra Knudsen, PA-C   1 year ago Erroneous encounter - disregard   [place] [facility], Cari S, PA-C       Future Appointments             In 4 days [name], NP [place] [facility]             diclofenac Sodium (VOLTAREN) 1 % GEL 350 g 0    Sig: Apply 2 g topically 4 (four)  times daily as needed.     Analgesics:  Topicals Failed - 08/30/2022  2:21 PM      Failed - Manual Review: Labs are only required if the patient has taken medication for more than 8 weeks.      Failed - HGB in normal range and within 360 days    Hemoglobin  Date Value Ref Range Status  01/29/2022 11.1 (L) 12.0 - 15.0 g/dL Final         Failed - HCT in normal range and within 360 days    HCT  Date Value Ref Range Status  01/29/2022 35.6 (L) 36.0 - 46.0 % Final         Failed - Cr in normal range and within 360 days    Creatinine, Ser  Date Value Ref Range Status  01/29/2022 1.04 (H) 0.44 - 1.00 mg/dL Final         Failed - Valid encounter within last 12 months    Recent Outpatient Visits           7 months ago Chronic frontal sinusitis   [place] [facility], [name] J, NP   1 year ago Chronic frontal sinusitis   [place] [facility] [name] J, NP   1 year ago Acute sinusitis, recurrence not specified, unspecified location   [facility], Kasandra Knudsen, PA-C   1 year ago Erroneous encounter - disregard   [place]  [facility], Kasandra Knudsen, PA-C   1 year ago Erroneous encounter - disregard   [place] [facility] at [facility], Cari S, PA-C       Future Appointments             In 4 days [name], NP [facility] at [facility] in normal range and within 360 days    Platelets  Date Value Ref Range Status  01/29/2022 217 150 - 400 K/uL Final         Passed - eGFR is 30 or above and within 360 days    GFR calc Af Amer  Date Value Ref Range Status  08/08/2019 >60 >60 mL/min Final   GFR, Estimated  Date Value Ref Range Status  01/29/2022 >60 >60 mL/min Final    Comment:    (NOTE) Calculated using the CKD-EPI Creatinine Equation (2021)          Passed - Patient is not pregnant

## 2022-09-01 NOTE — Progress Notes (Deleted)
  Patient ID: Janice Ryan, female    DOB: 07/11/1955  MRN: [id number]  CC: Referrals  Subjective: Janice Ryan is a 67 y.o. female who presents for referrals.  Her concerns today include:  Requests referrals for asthma and Orthopedics.   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     albuterol (PROVENTIL) (2.5 MG/3ML) 0.083% nebulizer solution Take 3 mLs (2.5 mg total) by nebulization every 6 (six) hours as needed for wheezing or shortness of breath. 150 mL 2   albuterol (VENTOLIN HFA) 108 (90 Base) MCG/ACT inhaler TAKE 2 PUFFS BY MOUTH EVERY 6 HOURS AS NEEDED FOR WHEEZE OR SHORTNESS OF BREATH 8.5 each 2   albuterol (VENTOLIN HFA) 108 (90 Base) MCG/ACT inhaler Inhale 2 puffs into the lungs every 6 (six) hours as needed for wheezing or shortness of breath. 18 g 2   amphetamine-dextroamphetamine (ADDERALL XR) 20 MG 24 hr capsule Take 20 mg by mouth daily.     busPIRone (BUSPAR) 5 MG tablet TAKE ONE TABLET BY MOUTH THREE TIMES A DAY (Patient taking differently: Take 5 mg by mouth 3 (three) times daily.) 90 tablet 1   carbamide peroxide (DEBROX) 6.5 % OTIC solution Place 2-4 drops into both ears 2 (two) times daily as  needed (to clean ears).     cetirizine (ZYRTEC ALLERGY) 10 MG tablet Take 1 tablet (10 mg total) by mouth daily. (Patient taking differently: Take 10 mg by mouth daily as needed for allergies.) 30 tablet 11   Cholecalciferol (VITAMIN D3 PO) Take 1 tablet by mouth daily.     diclofenac Sodium (VOLTAREN) 1 % GEL Apply 2 g topically 4 (four) times daily as needed. 350 g 0   DULoxetine (CYMBALTA) 60 MG capsule Take 1 capsule (60 mg total) by mouth daily. (Patient taking differently: Take 120 mg by mouth daily.) 30 capsule 2   fluticasone (FLONASE) 50 MCG/ACT nasal spray Place 2 sprays into both nostrils daily.  2   fluticasone-salmeterol (ADVAIR) 500-50 MCG/ACT AEPB Inhale 1 puff into the lungs in the morning and at bedtime. 60 each 2   gabapentin (NEURONTIN) 300 MG capsule Take 1 capsule (300 mg total) by mouth at bedtime. 30 capsule 0   meloxicam (MOBIC) 7.5 MG tablet Take 1 tablet (7.5 mg total) by mouth daily. As needed for pain; take after eating (Patient taking differently: Take 7.5 mg by mouth daily as needed for pain.) 30 tablet 4   Multiple Vitamin (MULTIVITAMIN WITH MINERALS) TABS tablet Take 1 tablet by mouth daily.     Omega-3 Fatty Acids (OMEGA-3 PO) Take 1 capsule by mouth daily.     OXYGEN Inhale into the lungs at bedtime as needed (2 L).     traMADol (ULTRAM) 50 MG   tablet Take 100 mg by mouth 2 (two) times daily as needed (pain).     traZODone (DESYREL) 50 MG tablet Take 1 tablet (50 mg total) by mouth at bedtime. 30 tablet 1   No current facility-administered medications on file prior to visit.    Allergies  Allergen Reactions   Neurontin [Gabapentin] Other (See Comments)    Has un-controlled movement of her body, excessive dribble of saliva at night   Sulfa Antibiotics Nausea And Vomiting and Other (See Comments)    Stomach pain    Social History   Socioeconomic History   Marital status: Married    Spouse name: Not on file   Number of children: Not on file   Years of  education: Not on file   Highest education level: Not on file  Occupational History   Not on file  Tobacco Use   Smoking status: Never   Smokeless tobacco: Never  Vaping Use   Vaping Use: Never used  Substance and Sexual Activity   Alcohol use: Never   Drug use: Never   Sexual activity: Not Currently  Other Topics Concern   Not on file  Social History Narrative   Not on file   Social Determinants of Health   Financial Resource Strain: Not on file  Food Insecurity: No Food Insecurity (01/24/2022)   Hunger Vital Sign    Worried About Running Out of Food in the Last Year: Never true    Ran Out of Food in the Last Year: Never true  Transportation Needs: No Transportation Needs (01/24/2022)   PRAPARE - Transportation    Lack of Transportation (Medical): No    Lack of Transportation (Non-Medical): No  Physical Activity: Not on file  Stress: Not on file  Social Connections: Not on file  Intimate Partner Violence: Not At Risk (01/24/2022)   Humiliation, Afraid, Rape, and Kick questionnaire    Fear of Current or Ex-Partner: No    Emotionally Abused: No    Physically Abused: No    Sexually Abused: No    Family History  Problem Relation Age of Onset   Atrial fibrillation Mother    Congestive Heart Failure Father     Past Surgical History:  Procedure Laterality Date   HERNIA REPAIR     JOINT REPLACEMENT Left    knee    ROS: Review of Systems Negative except as stated above  PHYSICAL EXAM: There were no vitals taken for this visit.  Physical Exam  {female adult master:310786} {female adult master:310785}     Latest Ref Rng & Units 01/29/2022    1:14 AM 01/28/2022   12:58 AM 01/27/2022    2:10 AM  CMP  Glucose 70 - 99 mg/dL 108  116  109   BUN 8 - 23 mg/dL 18  21  26   Creatinine 0.44 - 1.00 mg/dL 1.04  1.04  1.14   Sodium 135 - 145 mmol/L 137  138  137   Potassium 3.5 - 5.1 mmol/L 4.1  4.3  3.8   Chloride 98 - 111 mmol/L 95  92  91   CO2 22 - 32 mmol/L 34   36  38   Calcium 8.9 - 10.3 mg/dL 10.6  10.9  11.0    Lipid Panel     Component Value Date/Time   CHOL 148 03/23/2019 0438   TRIG 59 03/23/2019 0438   HDL 59 03/23/2019 0438   CHOLHDL 2.5 03/23/2019 0438   VLDL 12 03/23/2019 0438   LDLCALC   77 03/23/2019 0438    CBC    Component Value Date/Time   WBC 8.1 01/29/2022 0114   RBC 3.81 (L) 01/29/2022 0114   HGB 11.1 (L) 01/29/2022 0114   HCT 35.6 (L) 01/29/2022 0114   PLT 217 01/29/2022 0114   MCV 93.4 01/29/2022 0114   MCH 29.1 01/29/2022 0114   MCHC 31.2 01/29/2022 0114   RDW 12.3 01/29/2022 0114   LYMPHSABS 1.5 08/08/2019 0752   MONOABS 0.8 08/08/2019 0752   EOSABS 0.2 08/08/2019 0752   BASOSABS 0.0 08/08/2019 0752    ASSESSMENT AND PLAN:  There are no diagnoses linked to this encounter.   Patient was given the opportunity to ask questions.  Patient verbalized understanding of the plan and was able to repeat key elements of the plan. Patient was given clear instructions to go to Emergency Department or return to medical center if symptoms don't improve, worsen, or new problems develop.The patient verbalized understanding.   No orders of the defined types were placed in this encounter.    Requested Prescriptions    No prescriptions requested or ordered in this encounter    No follow-ups on file.  Drequan Ironside J Whitnie Deleon, NP  

## 2022-09-04 ENCOUNTER — Ambulatory Visit: Payer: Medicare Other | Admitting: Family

## 2022-09-07 ENCOUNTER — Other Ambulatory Visit: Payer: Self-pay | Admitting: Family

## 2022-09-07 DIAGNOSIS — I10 Essential (primary) hypertension: Secondary | ICD-10-CM

## 2022-09-08 ENCOUNTER — Other Ambulatory Visit: Payer: Self-pay | Admitting: Family

## 2022-09-12 NOTE — Progress Notes (Deleted)
  Patient ID: Janice Ryan, female    DOB: 10/07/1955  MRN: [id number]  CC: Referrals  Subjective: Janice Ryan is a 67 y.o. female who presents for referrals.  Her concerns today include:  Requests referrals for asthma and Orthopedics.   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     albuterol (PROVENTIL) (2.5 MG/3ML) 0.083% nebulizer solution Take 3 mLs (2.5 mg total) by nebulization every 6 (six) hours as needed for wheezing or shortness of breath. 150 mL 2   albuterol (VENTOLIN HFA) 108 (90 Base) MCG/ACT inhaler TAKE 2 PUFFS BY MOUTH EVERY 6 HOURS AS NEEDED FOR WHEEZE OR SHORTNESS OF BREATH 8.5 each 2   albuterol (VENTOLIN HFA) 108 (90 Base) MCG/ACT inhaler Inhale 2 puffs into the lungs every 6 (six) hours as needed for wheezing or shortness of breath. 18 g 2   amphetamine-dextroamphetamine (ADDERALL XR) 20 MG 24 hr capsule Take 20 mg by mouth daily.     busPIRone (BUSPAR) 5 MG tablet TAKE ONE TABLET BY MOUTH THREE TIMES A DAY (Patient taking differently: Take 5 mg by mouth 3 (three) times daily.) 90 tablet 1   carbamide peroxide (DEBROX) 6.5 % OTIC solution Place 2-4 drops into both ears 2 (two) times daily as  needed (to clean ears).     cetirizine (ZYRTEC ALLERGY) 10 MG tablet Take 1 tablet (10 mg total) by mouth daily. (Patient taking differently: Take 10 mg by mouth daily as needed for allergies.) 30 tablet 11   Cholecalciferol (VITAMIN D3 PO) Take 1 tablet by mouth daily.     diclofenac Sodium (VOLTAREN) 1 % GEL Apply 2 g topically 4 (four) times daily as needed. 350 g 0   DULoxetine (CYMBALTA) 60 MG capsule Take 1 capsule (60 mg total) by mouth daily. (Patient taking differently: Take 120 mg by mouth daily.) 30 capsule 2   fluticasone (FLONASE) 50 MCG/ACT nasal spray Place 2 sprays into both nostrils daily.  2   fluticasone-salmeterol (ADVAIR) 500-50 MCG/ACT AEPB Inhale 1 puff into the lungs in the morning and at bedtime. 60 each 2   gabapentin (NEURONTIN) 300 MG capsule Take 1 capsule (300 mg total) by mouth at bedtime. 30 capsule 0   meloxicam (MOBIC) 7.5 MG tablet Take 1 tablet (7.5 mg total) by mouth daily. As needed for pain; take after eating (Patient taking differently: Take 7.5 mg by mouth daily as needed for pain.) 30 tablet 4   Multiple Vitamin (MULTIVITAMIN WITH MINERALS) TABS tablet Take 1 tablet by mouth daily.     Omega-3 Fatty Acids (OMEGA-3 PO) Take 1 capsule by mouth daily.     OXYGEN Inhale into the lungs at bedtime as needed (2 L).     traMADol (ULTRAM) 50 MG   tablet Take 100 mg by mouth 2 (two) times daily as needed (pain).     traZODone (DESYREL) 50 MG tablet Take 1 tablet (50 mg total) by mouth at bedtime. 30 tablet 1   No current facility-administered medications on file prior to visit.    Allergies  Allergen Reactions   Neurontin [Gabapentin] Other (See Comments)    Has un-controlled movement of her body, excessive dribble of saliva at night   Sulfa Antibiotics Nausea And Vomiting and Other (See Comments)    Stomach pain    Social History   Socioeconomic History   Marital status: Married    Spouse name: Not on file   Number of children: Not on file   Years of  education: Not on file   Highest education level: Not on file  Occupational History   Not on file  Tobacco Use   Smoking status: Never   Smokeless tobacco: Never  Vaping Use   Vaping Use: Never used  Substance and Sexual Activity   Alcohol use: Never   Drug use: Never   Sexual activity: Not Currently  Other Topics Concern   Not on file  Social History Narrative   Not on file   Social Determinants of Health   Financial Resource Strain: Not on file  Food Insecurity: No Food Insecurity (01/24/2022)   Hunger Vital Sign    Worried About Running Out of Food in the Last Year: Never true    Ran Out of Food in the Last Year: Never true  Transportation Needs: No Transportation Needs (01/24/2022)   PRAPARE - Transportation    Lack of Transportation (Medical): No    Lack of Transportation (Non-Medical): No  Physical Activity: Not on file  Stress: Not on file  Social Connections: Not on file  Intimate Partner Violence: Not At Risk (01/24/2022)   Humiliation, Afraid, Rape, and Kick questionnaire    Fear of Current or Ex-Partner: No    Emotionally Abused: No    Physically Abused: No    Sexually Abused: No    Family History  Problem Relation Age of Onset   Atrial fibrillation Mother    Congestive Heart Failure Father     Past Surgical History:  Procedure Laterality Date   HERNIA REPAIR     JOINT REPLACEMENT Left    knee    ROS: Review of Systems Negative except as stated above  PHYSICAL EXAM: There were no vitals taken for this visit.  Physical Exam  {female adult master:310786} {female adult master:310785}     Latest Ref Rng & Units 01/29/2022    1:14 AM 01/28/2022   12:58 AM 01/27/2022    2:10 AM  CMP  Glucose 70 - 99 mg/dL 108  116  109   BUN 8 - 23 mg/dL 18  21  26   Creatinine 0.44 - 1.00 mg/dL 1.04  1.04  1.14   Sodium 135 - 145 mmol/L 137  138  137   Potassium 3.5 - 5.1 mmol/L 4.1  4.3  3.8   Chloride 98 - 111 mmol/L 95  92  91   CO2 22 - 32 mmol/L 34   36  38   Calcium 8.9 - 10.3 mg/dL 10.6  10.9  11.0    Lipid Panel     Component Value Date/Time   CHOL 148 03/23/2019 0438   TRIG 59 03/23/2019 0438   HDL 59 03/23/2019 0438   CHOLHDL 2.5 03/23/2019 0438   VLDL 12 03/23/2019 0438   LDLCALC   77 03/23/2019 0438    CBC    Component Value Date/Time   WBC 8.1 01/29/2022 0114   RBC 3.81 (L) 01/29/2022 0114   HGB 11.1 (L) 01/29/2022 0114   HCT 35.6 (L) 01/29/2022 0114   PLT 217 01/29/2022 0114   MCV 93.4 01/29/2022 0114   MCH 29.1 01/29/2022 0114   MCHC 31.2 01/29/2022 0114   RDW 12.3 01/29/2022 0114   LYMPHSABS 1.5 08/08/2019 0752   MONOABS 0.8 08/08/2019 0752   EOSABS 0.2 08/08/2019 0752   BASOSABS 0.0 08/08/2019 0752    ASSESSMENT AND PLAN:  There are no diagnoses linked to this encounter.   Patient was given the opportunity to ask questions.  Patient verbalized understanding of the plan and was able to repeat key elements of the plan. Patient was given clear instructions to go to Emergency Department or return to medical center if symptoms don't improve, worsen, or new problems develop.The patient verbalized understanding.   No orders of the defined types were placed in this encounter.    Requested Prescriptions    No prescriptions requested or ordered in this encounter    No follow-ups on file.  Britian Jentz J Nikolis Berent, NP  

## 2022-09-13 ENCOUNTER — Ambulatory Visit: Payer: Medicare Other | Admitting: Family

## 2022-09-14 ENCOUNTER — Telehealth: Payer: Self-pay | Admitting: Family

## 2022-09-14 NOTE — Telephone Encounter (Signed)
Pt requested for office visit to be changed to Video MyChart due to transportation issues. Called pt and left vm to let her know visit has been changed, also replied back on MyChart to let her know appt has been changed from OV to Video MyChart.

## 2022-09-15 NOTE — Progress Notes (Unsigned)
Virtual Visit via Video Note  I connected with Janice Ryan, on 09/18/2022 at 11:53 AM by video and verified that I am speaking with the correct person using two identifiers.  Consent: I discussed the limitations, risks, security and privacy concerns of performing an evaluation and management service by video and the availability of in person appointments. I also discussed with the patient that there may be a patient responsible charge related to this service. The patient expressed understanding and agreed to proceed.   Location of Patient: Home  Location of Provider: [facility]  Persons participating in Telemedicine visit: Tyeisha Pautsch Ricky Stabs, NP Curley Spice, CMA  History of Present Illness: Janice Ryan is a 67 y.o. female who presents for referrals.   Her concerns today include: - History of asthma. She denies red flag symptoms associated with asthma. Reports using Albuterol inhaler and Albuterol nebulizer as prescribed. Reports she needs refills on Albuterol inhaler and new nebulizer machine. Reports she is using 5 liters of oxygen. States she is only supposed to use oxygen during nighttime but has been using throughout the day due to asthma and allergies. She was established with Pulmonology in the past. She requests a referral to [facility] Pulmonology. States she needs a "walk test". - Would like to trial medication for allergies.  - Chronic right knee pain. Denies recent trauma/injury and red flag symptoms. Using wheelchair and walker to assist with ambulation. Reports she was established with Orthopedics and Pain Management in the past. She requests a referral to [facility] for MRI. Needs refills on Gabapentin (taking daily at bedtime) and Tramadol (taking 4 times daily). - Reports she would like to lose weight. She would like a referral to a weight specialist. States she plans to call her health  insurance to see if it will cover the cost of weight loss medications.  - Plans to schedule a physical exam at a later date.  - No further issues/concerns for discussion today.    Past Medical History:  Diagnosis Date   Acute respiratory failure with hypoxia (HCC)    Asthma    Hypertension    Morbid obesity (HCC)    Obesity with alveolar hypoventilation and body mass index (BMI) of 40 or greater (HCC)    OSA (obstructive sleep apnea)    Allergies  Allergen Reactions   Neurontin [Gabapentin] Other (See Comments)    Has un-controlled movement of her body, excessive dribble of saliva at night   Sulfa Antibiotics Nausea And Vomiting and Other (See Comments)    Stomach pain    Current Outpatient Medications on File Prior to Visit  Medication Sig Dispense Refill   acetaminophen (TYLENOL) 500 MG tablet Take 2,000 mg by mouth daily as needed for mild pain or headache.     albuterol (PROVENTIL) (2.5 MG/3ML) 0.083% nebulizer solution Take 3 mLs (2.5 mg total) by nebulization every 6 (six) hours as needed for wheezing or shortness of breath. 150 mL 2   albuterol (VENTOLIN HFA) 108 (90 Base) MCG/ACT inhaler TAKE 2 PUFFS BY MOUTH EVERY 6 HOURS AS NEEDED FOR WHEEZE OR SHORTNESS OF BREATH 8.5 each 2   albuterol (VENTOLIN HFA) 108 (90 Base) MCG/ACT inhaler Inhale 2 puffs into the lungs every 6 (six) hours as needed for wheezing or shortness of breath. 18 g 2   amphetamine-dextroamphetamine (ADDERALL XR) 20 MG 24 hr capsule Take 20 mg by mouth daily.     busPIRone (BUSPAR) 5 MG tablet TAKE ONE TABLET  BY MOUTH THREE TIMES A DAY (Patient taking differently: Take 5 mg by mouth 3 (three) times daily.) 90 tablet 1   carbamide peroxide (DEBROX) 6.5 % OTIC solution Place 2-4 drops into both ears 2 (two) times daily as needed (to clean ears).     cetirizine (ZYRTEC ALLERGY) 10 MG tablet Take 1 tablet (10 mg total) by mouth daily. (Patient taking differently: Take 10 mg by mouth daily as needed for allergies.)  30 tablet 11   Cholecalciferol (VITAMIN D3 PO) Take 1 tablet by mouth daily.     diclofenac Sodium (VOLTAREN) 1 % GEL Apply 2 g topically 4 (four) times daily as needed. 350 g 0   DULoxetine (CYMBALTA) 60 MG capsule Take 1 capsule (60 mg total) by mouth daily. (Patient taking differently: Take 120 mg by mouth daily.) 30 capsule 2   fluticasone (FLONASE) 50 MCG/ACT nasal spray Place 2 sprays into both nostrils daily.  2   fluticasone-salmeterol (ADVAIR) 500-50 MCG/ACT AEPB Inhale 1 puff into the lungs in the morning and at bedtime. 60 each 2   gabapentin (NEURONTIN) 300 MG capsule Take 1 capsule (300 mg total) by mouth at bedtime. 30 capsule 0   meloxicam (MOBIC) 7.5 MG tablet Take 1 tablet (7.5 mg total) by mouth daily. As needed for pain; take after eating (Patient taking differently: Take 7.5 mg by mouth daily as needed for pain.) 30 tablet 4   Multiple Vitamin (MULTIVITAMIN WITH MINERALS) TABS tablet Take 1 tablet by mouth daily.     Omega-3 Fatty Acids (OMEGA-3 PO) Take 1 capsule by mouth daily.     OXYGEN Inhale into the lungs at bedtime as needed (2 L).     traMADol (ULTRAM) 50 MG tablet Take 100 mg by mouth 2 (two) times daily as needed (pain).     traZODone (DESYREL) 50 MG tablet Take 1 tablet (50 mg total) by mouth at bedtime. 30 tablet 1   No current facility-administered medications on file prior to visit.    Observations/Objective: Alert and oriented x 3. Not in acute distress. Physical examination not completed as this is a telemedicine visit.  Assessment and Plan: Note - I consulted with Georganna Skeans, MD regarding patient's plan of care.   1. Asthma, unspecified asthma severity, unspecified whether complicated, unspecified whether persistent 2. Oxygen dependent - Continue Albuterol nebulizer as prescribed. No refills needed as of present.  - Continue Albuterol inhaler as prescribed.  - Nebulizer machine ordered. - Referral to Pulmonology for further evaluation/management.  During the interim follow-up with primary provider as scheduled.  - For home use only DME Nebulizer machine - albuterol (VENTOLIN HFA) 108 (90 Base) MCG/ACT inhaler; Inhale 1-2 puffs into the lungs every 6 (six) hours as needed for wheezing or shortness of breath.  Dispense: 18 g; Refill: 2 - Ambulatory referral to Pulmonology  3. Chronic pain of right knee - Continue Gabapentin as prescribed.  - Continue Tramadol as prescribed. I did check the [facility] prescription drug database. - Referral to Orthopedics for further evaluation/management. During the interim follow-up with primary provider as scheduled.  - Ambulatory referral to Orthopedics - gabapentin (NEURONTIN) 300 MG capsule; Take 1 capsule (300 mg total) by mouth at bedtime.  Dispense: 90 capsule; Refill: 0 - traMADol (ULTRAM) 50 MG tablet; Take 1 tablet (50 mg total) by mouth every 6 (six) hours as needed (pain).  Dispense: 120 tablet; Refill: 0  4. Perennial allergic rhinitis - Cetirizine as prescribed. Counseled on medication adherence/adverse effects.  - Follow-up  with primary provider as scheduled.  - cetirizine (ZYRTEC ALLERGY) 10 MG tablet; Take 1 tablet (10 mg total) by mouth daily.  Dispense: 30 tablet; Refill: 2  5. Encounter for weight management 6. Weight gain - Referral to Medical Weight Management for further evaluation/management. - Amb Ref to Medical Weight Management   Follow Up Instructions: Follow-up with primary provider as scheduled.   Patient was given clear instructions to go to Emergency Department or return to medical center if symptoms don't improve, worsen, or new problems develop.The patient verbalized understanding.  I discussed the assessment and treatment plan with the patient. The patient was provided an opportunity to ask questions and all were answered. The patient agreed with the plan and demonstrated an understanding of the instructions.   The patient was advised to call back or seek an  in-person evaluation if the symptoms worsen or if the condition fails to improve as anticipated.   I provided 15 minutes total of non-face-to-face time during this encounter.   [name], NP  [facility] at [facility] [place], [place] [phone] 09/18/2022, 11:53 AM

## 2022-09-18 ENCOUNTER — Telehealth (INDEPENDENT_AMBULATORY_CARE_PROVIDER_SITE_OTHER): Payer: Medicare Other | Admitting: Family

## 2022-09-18 DIAGNOSIS — Z9981 Dependence on supplemental oxygen: Secondary | ICD-10-CM

## 2022-09-18 DIAGNOSIS — G8929 Other chronic pain: Secondary | ICD-10-CM | POA: Diagnosis not present

## 2022-09-18 DIAGNOSIS — R635 Abnormal weight gain: Secondary | ICD-10-CM

## 2022-09-18 DIAGNOSIS — Z7689 Persons encountering health services in other specified circumstances: Secondary | ICD-10-CM

## 2022-09-18 DIAGNOSIS — M25561 Pain in right knee: Secondary | ICD-10-CM | POA: Diagnosis not present

## 2022-09-18 DIAGNOSIS — J45909 Unspecified asthma, uncomplicated: Secondary | ICD-10-CM

## 2022-09-18 DIAGNOSIS — J3089 Other allergic rhinitis: Secondary | ICD-10-CM

## 2022-09-18 MED ORDER — TRAMADOL HCL 50 MG PO TABS: 50.0000 mg | ORAL_TABLET | Freq: Four times a day (QID) | ORAL | 0 refills | Status: AC | PRN

## 2022-09-18 MED ORDER — CETIRIZINE HCL 10 MG PO TABS: 10.0000 mg | ORAL_TABLET | Freq: Every day | ORAL | 2 refills | Status: DC

## 2022-09-18 MED ORDER — GABAPENTIN 300 MG PO CAPS: 300.0000 mg | ORAL_CAPSULE | Freq: Every day | ORAL | 0 refills | Status: DC

## 2022-09-18 MED ORDER — ALBUTEROL SULFATE HFA 108 (90 BASE) MCG/ACT IN AERS: 1.0000 | INHALATION_SPRAY | Freq: Four times a day (QID) | RESPIRATORY_TRACT | 2 refills | Status: DC | PRN

## 2022-09-20 ENCOUNTER — Ambulatory Visit: Payer: Self-pay | Admitting: *Deleted

## 2022-09-20 NOTE — Telephone Encounter (Signed)
Summary: sinus discomfort / rx req   The patient has experienced sinus discomfort for multiple weeks  The patient shares that they have experienced congestion, headache, congestion and sinus pressure  The patient has tried multiple otc medications which have been ineffective  Please contact further when possible     Chief Complaint: "Sinus Infection" Symptoms: Frontal headache "Sinus area tight." Left ear clogged, painful, nose "Blocked"  L>R  Reports LGT, no values Frequency: 2 weeks, worsening Pertinent Negatives: Patient denies  Disposition: [] ED /[] Urgent Care (no appt availability in office) / [] Appointment(In office/virtual)/ []  [place] [facility]/ [] Home Care/ [] Refused Recommended Disposition /[] [place] [facility]/ [x]  Follow-up with PCP Additional Notes: Pt requesting Flonase and antibiotic be called in. States she had VV Monday and forgot to mention all this, and symptoms worsening. "I have to be able to breathe thru my nose with this oxygen." States has tried "Everything I have here" over the counter meds, sprays.No VV available. Please advise.  Reason for Disposition  Earache  Answer Assessment - Initial Assessment Questions 1. LOCATION: "Where does it hurt?"      Frontal headache,sinus area tight 2. ONSET: "When did the sinus pain start?"  (e.g., hours, days)      2 weeks ago 3. SEVERITY: "How bad is the pain?"   (Scale 1-10; mild, moderate or severe)   - MILD (1-3): doesn't interfere with normal activities    - MODERATE (4-7): interferes with normal activities (e.g., work or school) or awakens from sleep   - SEVERE (8-10): excruciating pain and patient unable to do any normal activities        Worse from Monday 4. RECURRENT SYMPTOM: "Have you ever had sinus problems before?" If Yes, ask: "When was the last time?" and "What happened that time?"      Yes 5. NASAL CONGESTION: "Is the nose blocked?" If Yes, ask: "Can you open it or must you breathe through your  mouth?"     Yes 6. NASAL DISCHARGE: "Do you have discharge from your nose?" If so ask, "What color?"    "Stuffy" 7. FEVER: "Do you have a fever?" If Yes, ask: "What is it, how was it measured, and when did it start?"      States LGT 8. OTHER SYMPTOMS: "Do you have any other symptoms?" (e.g., sore throat, cough, earache, difficulty breathing)     Ears stopped up  L>R  Left ear achy  Protocols used: Sinus Pain or Congestion-A-AH

## 2022-09-22 ENCOUNTER — Other Ambulatory Visit: Payer: Self-pay | Admitting: Family

## 2022-09-22 DIAGNOSIS — I10 Essential (primary) hypertension: Secondary | ICD-10-CM

## 2022-09-29 ENCOUNTER — Encounter: Payer: Medicare Other | Admitting: Family

## 2022-09-29 ENCOUNTER — Telehealth: Payer: Self-pay | Admitting: Family

## 2022-09-29 ENCOUNTER — Other Ambulatory Visit: Payer: Self-pay | Admitting: Family

## 2022-09-29 DIAGNOSIS — J329 Chronic sinusitis, unspecified: Secondary | ICD-10-CM

## 2022-09-29 DIAGNOSIS — I10 Essential (primary) hypertension: Secondary | ICD-10-CM

## 2022-09-29 MED ORDER — LISINOPRIL 20 MG PO TABS: 20.0000 mg | ORAL_TABLET | Freq: Every day | ORAL | 0 refills | Status: DC

## 2022-09-29 MED ORDER — AMOXICILLIN-POT CLAVULANATE 875-125 MG PO TABS: 1.0000 | ORAL_TABLET | Freq: Two times a day (BID) | ORAL | 0 refills | Status: DC

## 2022-09-29 NOTE — Progress Notes (Signed)
Erroneous encounter-disregard

## 2022-09-29 NOTE — Telephone Encounter (Signed)
Called pt to see if she wanted to reschedule her Mychart video visit; pt stated that she was feeling too sick and will be going to sleep and did not want to continue visit for today. Stated she has a headache, sinus infection, and needs BP meds. Rescheduled for a later date. Pt requested refill for BP meds and antibiotics for sinus infection.

## 2022-09-29 NOTE — Telephone Encounter (Signed)
-   Lisinopril prescribed.  - Augmentin prescribed.

## 2022-10-06 ENCOUNTER — Encounter: Payer: Medicare Other | Admitting: Family

## 2022-10-06 NOTE — Progress Notes (Signed)
Erroneous encounter-disregard

## 2022-10-09 ENCOUNTER — Other Ambulatory Visit: Payer: Self-pay | Admitting: Family

## 2022-10-09 ENCOUNTER — Ambulatory Visit: Payer: Self-pay | Admitting: *Deleted

## 2022-10-09 DIAGNOSIS — I10 Essential (primary) hypertension: Secondary | ICD-10-CM

## 2022-10-09 MED ORDER — LISINOPRIL 20 MG PO TABS: 20.0000 mg | ORAL_TABLET | Freq: Every day | ORAL | 0 refills | Status: DC

## 2022-10-09 NOTE — Telephone Encounter (Signed)
Medication Refill - Medication: lisinopril (ZESTRIL) 20 MG tablet   Stated needs medication transferred to pharmacy below.   Has the patient contacted their pharmacy? No. (Agent: If no, request that the patient contact the pharmacy for the refill. If patient does not wish to contact the pharmacy document the reason why and proceed with request.)   Preferred Pharmacy (with phone number or street name):  [id number] [place], [place] - [address]  [address] [place] [address]  Phone: [phone] Fax: [phone]  Hours: Not open 24 hours   Has the patient been seen for an appointment in the last year OR does the patient have an upcoming appointment? Yes.    Agent: Please be advised that RX refills may take up to 3 business days. We ask that you follow-up with your pharmacy.

## 2022-10-09 NOTE — Telephone Encounter (Signed)
[facility] to [facility] on [address]

## 2022-10-09 NOTE — Telephone Encounter (Signed)
Requested Prescriptions  Pending Prescriptions Disp Refills   lisinopril (ZESTRIL) 20 MG tablet 90 tablet 0    Sig: Take 1 tablet (20 mg total) by mouth daily.     Cardiovascular:  ACE Inhibitors Failed - 10/09/2022 11:12 AM      Failed - Cr in normal range and within 180 days    Creatinine, Ser  Date Value Ref Range Status  01/29/2022 1.04 (H) 0.44 - 1.00 mg/dL Final         Failed - K in normal range and within 180 days    Potassium  Date Value Ref Range Status  01/29/2022 4.1 3.5 - 5.1 mmol/L Final         Failed - Last BP in normal range    BP Readings from Last 1 Encounters:  01/29/22 (!) 140/73         Passed - Patient is not pregnant      Passed - Valid encounter within last 6 months    Recent Outpatient Visits           3 weeks ago Asthma, unspecified asthma severity, unspecified whether complicated, unspecified whether persistent   [place] [facility], [name] J, NP   9 months ago Chronic frontal sinusitis   [place] [facility], [name] J, NP   1 year ago Chronic frontal sinusitis   [place] [facility], [name] J, NP   1 year ago Acute sinusitis, recurrence not specified, unspecified location   [facility] at [facility], Kasandra Knudsen, PA-C   1 year ago Erroneous encounter - disregard   [facility] at [facility], Kasandra Knudsen, [place]       Future Appointments             In 4 weeks [name], NP [facility] at [facility]

## 2022-10-09 NOTE — Telephone Encounter (Signed)
  Chief Complaint: Sinus Issues Symptoms: Nasal drainage Frequency: 09/29/22 Pertinent Negatives: Patient denies  Disposition: [] ED /[] Urgent Care (no appt availability in office) / [] Appointment(In office/virtual)/ []  [place] [facility]/ [] Home Care/ [] Refused Recommended Disposition /[] [place] [facility]/ [x]  Follow-up with PCP Additional Notes: Advised ATB was called in 09/29/22. Stated unaware. States she did not know she had a VV scheduled for 10/06/22. "NO show." Other requests: Would ike referral to female Gynecologist Would like info on how to obtain "Free incontinence supplies I saw on TV, free with Medicare." Pt evasive regarding specific type, something for bed. Secured Annual per request for 11/07/22. Would like reminder. Pt states she will pick up ATBs. States sinus symptoms not worsening, not any better. Reason for Disposition  [1] Nasal discharge AND [2] present > 10 days  Answer Assessment - Initial Assessment Questions 1. LOCATION: "Where does it hurt?"      Frontal headache, cheekbones, ears 2. ONSET: "When did the sinus pain start?"  (e.g., hours, days)      *No Answer* 3. SEVERITY: "How bad is the pain?"   (Scale 1-10; mild, moderate or severe)   - MILD (1-3): doesn't interfere with normal activities    - MODERATE (4-7): interferes with normal activities (e.g., work or school) or awakens from sleep   - SEVERE (8-10): excruciating pain and patient unable to do any normal activities        Varies 4. RECURRENT SYMPTOM: "Have you ever had sinus problems before?" If Yes, ask: "When was the last time?" and "What happened that time?"      yes 5. NASAL CONGESTION: "Is the nose blocked?" If Yes, ask: "Can you open it or must you breathe through your mouth?"     Yes 6. NASAL DISCHARGE: "Do you have discharge from your nose?" If so ask, "What color?"     A lot, cream colored 7. FEVER: "Do you have a fever?" If Yes, ask: "What is it, how was it measured, and when did it  start?"      No 8. OTHER SYMPTOMS: "Do you have any other symptoms?" (e.g., sore throat, cough, earache, difficulty breathing)     Right Ear ache  Protocols used: Sinus Pain or Congestion-A-AH

## 2022-10-10 ENCOUNTER — Telehealth: Payer: Self-pay | Admitting: Family

## 2022-10-10 NOTE — Telephone Encounter (Signed)
Copied from CRM [id number]. Topic: Referral - Status >> Oct 10, 2022  9:40 AM Epimenio Foot F wrote: Reason for CRM: Pt is calling in because she says she called  Orthopedics to schedule an appointment and was told the referral hadn't been put in. Pt says she also needs a referral for a MRI. Please advise.

## 2022-10-10 NOTE — Telephone Encounter (Signed)
Note from referral placed on 09/19/22-Thank you for trusting the care of your patient to our office. However we have made three unsuccessful attempts to contact the patient, to schedule an appointment. At this time we will close the referral, if an appointment is still needed please have the patient contact our office at [phone] ext.[phone].

## 2022-10-11 NOTE — Telephone Encounter (Signed)
Called patient couldn't leave voicemail will try again

## 2022-10-16 ENCOUNTER — Telehealth: Payer: Self-pay | Admitting: Family

## 2022-10-16 ENCOUNTER — Other Ambulatory Visit: Payer: Self-pay | Admitting: Family

## 2022-10-16 DIAGNOSIS — J45909 Unspecified asthma, uncomplicated: Secondary | ICD-10-CM

## 2022-10-16 DIAGNOSIS — Z9981 Dependence on supplemental oxygen: Secondary | ICD-10-CM

## 2022-10-16 NOTE — Telephone Encounter (Signed)
Pt is calling to report that she would like to referral to be placed to [facility] instead of [facility]. Please advise CB- [phone]

## 2022-10-16 NOTE — Telephone Encounter (Signed)
Called patient to discuss previous concerns about referral no answer couldn't leave vm.

## 2022-10-16 NOTE — Telephone Encounter (Signed)
Pulmonology referral per patient requested preference. Expect call within 2 weeks with appointment details.

## 2022-10-17 ENCOUNTER — Other Ambulatory Visit: Payer: Self-pay | Admitting: Family

## 2022-10-17 DIAGNOSIS — G8929 Other chronic pain: Secondary | ICD-10-CM

## 2022-10-17 DIAGNOSIS — I10 Essential (primary) hypertension: Secondary | ICD-10-CM

## 2022-10-17 NOTE — Telephone Encounter (Signed)
Pt called and understands ref sent to [facility]

## 2022-10-18 ENCOUNTER — Other Ambulatory Visit: Payer: Self-pay | Admitting: Family

## 2022-10-18 DIAGNOSIS — G8929 Other chronic pain: Secondary | ICD-10-CM

## 2022-10-18 NOTE — Telephone Encounter (Signed)
Requested medication (s) are due for refill today -provider review  Requested medication (s) are on the active medication list -yes  Future visit scheduled -yes  Last refill: 09/18/22 #120  Notes to clinic: non delegated Rx- Ultram 50 mg requested  Requested Prescriptions     Requested Prescriptions

## 2022-10-18 NOTE — Telephone Encounter (Signed)
Medication Refill - Medication: traMADol (ULTRAM) 50 MG tablet and Lyrica 50mg   Has the patient contacted their pharmacy? No.  Preferred Pharmacy (with phone number or street name):  [id number] [address], [place] [address] Phone: [phone]  Fax: [phone]     Has the patient been seen for an appointment in the last year OR does the patient have an upcoming appointment? No.  Agent: Please be advised that RX refills may take up to 3 business days. We ask that you follow-up with your pharmacy.

## 2022-10-18 NOTE — Telephone Encounter (Signed)
Lyrica 50mg  is not on the current list, routing for review.

## 2022-10-24 ENCOUNTER — Other Ambulatory Visit: Payer: Self-pay | Admitting: Family

## 2022-10-24 DIAGNOSIS — G8929 Other chronic pain: Secondary | ICD-10-CM

## 2022-10-24 MED ORDER — TRAMADOL HCL 50 MG PO TABS: 50.0000 mg | ORAL_TABLET | Freq: Four times a day (QID) | ORAL | 0 refills | Status: AC | PRN

## 2022-10-24 NOTE — Telephone Encounter (Signed)
Complete

## 2022-10-25 ENCOUNTER — Encounter: Payer: Medicare Other | Admitting: Family

## 2022-10-25 NOTE — Progress Notes (Signed)
I connected to Video Visit call at 16:00. Patient was not present.

## 2022-11-03 ENCOUNTER — Other Ambulatory Visit: Payer: Self-pay | Admitting: Family

## 2022-11-03 DIAGNOSIS — J329 Chronic sinusitis, unspecified: Secondary | ICD-10-CM

## 2022-11-06 NOTE — Telephone Encounter (Signed)
Complete

## 2022-11-07 ENCOUNTER — Encounter: Payer: Medicare Other | Admitting: Family

## 2022-11-07 ENCOUNTER — Telehealth: Payer: Self-pay | Admitting: Family

## 2022-11-07 NOTE — Telephone Encounter (Signed)
Called patient to reschedule missed appointment from this morning , couldn't leave message voicemail full.

## 2022-11-07 NOTE — Progress Notes (Signed)
Erroneous encounter-disregard

## 2022-11-20 ENCOUNTER — Telehealth: Payer: Self-pay | Admitting: Family

## 2022-11-20 NOTE — Telephone Encounter (Signed)
Copied from CRM [id number]. Topic: Referral - Request for Referral >> Nov 17, 2022 10:44 AM Dondra Prader A wrote: Reason for CRM: Orthopedic  Has patient seen PCP for this complaint? No. *If NO, is insurance requiring patient see PCP for this issue before PCP can refer them? Pt is wanting a request to [facility], pt is not stating why she is wanting to be seen my an Orthopedic.

## 2022-11-20 NOTE — Telephone Encounter (Signed)
Left voicemail to call office back so I can get more info to send to [name].

## 2022-11-20 NOTE — Telephone Encounter (Signed)
Called patient and left voicemail back so we can go over why she called.

## 2022-11-24 ENCOUNTER — Ambulatory Visit: Payer: Medicare Other | Admitting: Family

## 2022-12-05 ENCOUNTER — Other Ambulatory Visit: Payer: Self-pay | Admitting: Family

## 2022-12-05 DIAGNOSIS — I10 Essential (primary) hypertension: Secondary | ICD-10-CM

## 2022-12-05 DIAGNOSIS — G8929 Other chronic pain: Secondary | ICD-10-CM

## 2022-12-05 NOTE — Telephone Encounter (Signed)
Complete

## 2022-12-06 ENCOUNTER — Encounter: Payer: Self-pay | Admitting: Family

## 2022-12-06 ENCOUNTER — Encounter: Payer: Medicare Other | Admitting: Family

## 2022-12-06 NOTE — Progress Notes (Signed)
Erroneous encounter-disregard

## 2022-12-19 ENCOUNTER — Telehealth: Payer: Self-pay | Admitting: Family

## 2022-12-19 NOTE — Telephone Encounter (Unsigned)
Copied from CRM [id number]. Topic: General - Other >> Dec 19, 2022  4:19 PM Franchot Heidelberg wrote: Reason for CRM: Pt called wanting to discuss her medications with the clinic  [phone]

## 2022-12-21 NOTE — Telephone Encounter (Signed)
Called patient, went straight to voicemail. Left voicemail to call the office back to address concerns.

## 2023-01-01 ENCOUNTER — Ambulatory Visit: Payer: Self-pay

## 2023-01-01 NOTE — Telephone Encounter (Signed)
  Chief Complaint: Back Pain shooting down Symptoms: Pain Frequency: today Pertinent Negatives: Patient denies fever, numbness Disposition: [] ED /[] Urgent Care (no appt availability in office) / [x] Appointment(In office/virtual)/ []  [facility]/ [] Home Care/ [] Refused Recommended Disposition /[] [place] [facility]/ [x]  Follow-up with PCP Additional Notes: Pt called to report severe back pain that travels down to her knees. Pain started this morning. Pt fell last week and wonders if her Tail bone is broken. Pt has an upcoming appt with ortho, but would like pain medication called in. Pt mentioned that the gabapentin and tramadol was providing little if any relief.  Pt states that in the past vicoden was good for pain for her. [facility] [phone]. Pt would like a call back when medication is called in.  Please advise. Pt has 2 upcoming appts in office. Called pt back to see if both were needed. Left message to return call to review upcoming appts.    Reason for Disposition  [1] SEVERE back pain (e.g., excruciating, unable to do any normal activities) AND [2] not improved 2 hours after pain medicine  Answer Assessment - Initial Assessment Questions 1. ONSET: "When did the pain begin?"      This morning 2. LOCATION: "Where does it hurt?" (upper, mid or lower back)     Lower back 3. SEVERITY: "How bad is the pain?"  (e.g., Scale 1-10; mild, moderate, or severe)   - MILD (1-3): Doesn't interfere with normal activities.    - MODERATE (4-7): Interferes with normal activities or awakens from sleep.    - SEVERE (8-10): Excruciating pain, unable to do any normal activities.      severe 4. PATTERN: "Is the pain constant?" (e.g., yes, no; constant, intermittent)      constant 5. RADIATION: "Does the pain shoot into your legs or somewhere else?"     Down both legs 6. CAUSE:  "What do you think is causing the back pain?"      Fall last week.   7. BACK OVERUSE:  "Any recent lifting of heavy objects, strenuous work or exercise?"     none 8. MEDICINES: "What have you taken so far for the pain?" (e.g., nothing, acetaminophen, NSAIDS)     acetaminophen 9. NEUROLOGIC SYMPTOMS: "Do you have any weakness, numbness, or problems with bowel/bladder control?"     no 10. OTHER SYMPTOMS: "Do you have any other symptoms?" (e.g., fever, abdomen pain, burning with urination, blood in urine)       no  Protocols used: Back Pain-A-AH

## 2023-01-02 ENCOUNTER — Other Ambulatory Visit: Payer: Self-pay | Admitting: Family

## 2023-01-02 ENCOUNTER — Ambulatory Visit: Payer: Medicare Other | Admitting: Orthopaedic Surgery

## 2023-01-02 DIAGNOSIS — J3089 Other allergic rhinitis: Secondary | ICD-10-CM

## 2023-01-02 NOTE — Telephone Encounter (Signed)
Report to Urgent Care/Emergency Department/call 911 for immediate medical evaluation. Keep upcoming appointment with Orthopedics.

## 2023-01-03 ENCOUNTER — Other Ambulatory Visit: Payer: Self-pay

## 2023-01-03 DIAGNOSIS — J3089 Other allergic rhinitis: Secondary | ICD-10-CM

## 2023-01-03 MED ORDER — CETIRIZINE HCL 10 MG PO TABS: 10.0000 mg | ORAL_TABLET | Freq: Every day | ORAL | 2 refills | Status: DC

## 2023-01-08 ENCOUNTER — Encounter: Payer: Medicare Other | Admitting: Family

## 2023-01-08 ENCOUNTER — Ambulatory Visit: Payer: Self-pay

## 2023-01-08 NOTE — Telephone Encounter (Signed)
Patient request medication 

## 2023-01-08 NOTE — Telephone Encounter (Signed)
     Chief Complaint: Back pain. "I have an appointment Friday with orthopedics and I need [name] to call in Tramadol, enough until Friday. It's a 2 day wait in the ED. She can call me." Symptoms: Above Frequency: Last week Pertinent Negatives: Patient denies  Disposition: [] ED /[] Urgent Care (no appt availability in office) / [] Appointment(In office/virtual)/ []  [facility]/ [] Home Care/ [x] Refused Recommended Disposition /[] [place] [facility]/ [x]  Follow-up with PCP Additional Notes: Please advise pt.  Reason for Disposition  [1] MODERATE back pain (e.g., interferes with normal activities) AND [2] present > 3 days  Answer Assessment - Initial Assessment Questions 1. ONSET: "When did the pain begin?"      "For awhile" 2. LOCATION: "Where does it hurt?" (upper, mid or lower back)     Lower 3. SEVERITY: "How bad is the pain?"  (e.g., Scale 1-10; mild, moderate, or severe)   - MILD (1-3): Doesn't interfere with normal activities.    - MODERATE (4-7): Interferes with normal activities or awakens from sleep.    - SEVERE (8-10): Excruciating pain, unable to do any normal activities.      Severe 4. PATTERN: "Is the pain constant?" (e.g., yes, no; constant, intermittent)      constant 5. RADIATION: "Does the pain shoot into your legs or somewhere else?"     Buttocks 6. CAUSE:  "What do you think is causing the back pain?"      Unsure 7. BACK OVERUSE:  "Any recent lifting of heavy objects, strenuous work or exercise?"     No 8. MEDICINES: "What have you taken so far for the pain?" (e.g., nothing, acetaminophen, NSAIDS)     Yes 9. NEUROLOGIC SYMPTOMS: "Do you have any weakness, numbness, or problems with bowel/bladder control?"     No 10. OTHER SYMPTOMS: "Do you have any other symptoms?" (e.g., fever, abdomen pain, burning with urination, blood in urine)       No 11. PREGNANCY: "Is there any chance you are pregnant?" "When was your last menstrual period?"        No  Protocols used: Back Pain-A-AH

## 2023-01-08 NOTE — Progress Notes (Signed)
Erroneous encounter-disregard

## 2023-01-09 ENCOUNTER — Other Ambulatory Visit: Payer: Self-pay | Admitting: Family

## 2023-01-09 DIAGNOSIS — G8929 Other chronic pain: Secondary | ICD-10-CM

## 2023-01-09 MED ORDER — TRAMADOL HCL 50 MG PO TABS
50.0000 mg | ORAL_TABLET | Freq: Four times a day (QID) | ORAL | 0 refills | Status: DC | PRN
Start: 2023-01-09 — End: 2023-01-17

## 2023-01-09 NOTE — Telephone Encounter (Signed)
Complete

## 2023-01-10 NOTE — Telephone Encounter (Signed)
Unable to reach patient.  Has appointment scheduled 01/16/2023

## 2023-01-16 ENCOUNTER — Other Ambulatory Visit: Payer: Self-pay | Admitting: Family

## 2023-01-16 ENCOUNTER — Ambulatory Visit: Payer: Self-pay

## 2023-01-16 ENCOUNTER — Telehealth: Payer: Self-pay | Admitting: Family

## 2023-01-16 ENCOUNTER — Ambulatory Visit: Payer: Medicare Other | Admitting: Family

## 2023-01-16 DIAGNOSIS — J4520 Mild intermittent asthma, uncomplicated: Secondary | ICD-10-CM

## 2023-01-16 DIAGNOSIS — Z9981 Dependence on supplemental oxygen: Secondary | ICD-10-CM

## 2023-01-16 DIAGNOSIS — G479 Sleep disorder, unspecified: Secondary | ICD-10-CM

## 2023-01-16 MED ORDER — ALBUTEROL SULFATE HFA 108 (90 BASE) MCG/ACT IN AERS
2.0000 | INHALATION_SPRAY | Freq: Four times a day (QID) | RESPIRATORY_TRACT | 2 refills | Status: DC | PRN
Start: 2023-01-16 — End: 2023-09-13

## 2023-01-16 MED ORDER — TRAZODONE HCL 50 MG PO TABS
50.0000 mg | ORAL_TABLET | Freq: Every day | ORAL | 1 refills | Status: DC
Start: 2023-01-16 — End: 2023-02-09

## 2023-01-16 MED ORDER — MONTELUKAST SODIUM 10 MG PO TABS: 10.0000 mg | ORAL_TABLET | Freq: Every day | ORAL | 0 refills | Status: DC

## 2023-01-16 NOTE — Telephone Encounter (Signed)
Summary: back/hip/knee pain   Pt is calling in to see if she can get a refill due to her back, hip, and knee pain. Pt states that she goes to [facility] next week and is wanting to see if her PCP can call her in enough medication to last her until 01/23/2023. Please advise. (Pt did have an appt scheduled for today for back pain, but called in to cancel due to having wisdom teeth pulled today)  Medication: traMADol (ULTRAM) 50 MG tablet     Chief Complaint: Pt. Asking for Tramadol refill until Ortho appointment next week. Also states she has been on Singulair and Trazodone in the past that she would like to go back on. Symptoms: Pain Frequency:  Pertinent Negatives: Patient denies  Disposition: [] ED /[] Urgent Care (no appt availability in office) / [] Appointment(In office/virtual)/ []  [facility]/ [] Home Care/ [] Refused Recommended Disposition /[] [place] [facility]/ [x]  Follow-up with PCP Additional Notes: Please advise pt.   Reason for Disposition  [1] Caller has NON-URGENT medicine question about med that PCP prescribed AND [2] triager unable to answer question  Answer Assessment - Initial Assessment Questions 1. DRUG NAME: "What medicine do you need to have refilled?"     Tramadol, Singulair, Trazodone  2. REFILLS REMAINING: "How many refills are remaining?" (Note: The label on the medicine or pill bottle will show how many refills are remaining. If there are no refills remaining, then a renewal may be needed.)     0 3. EXPIRATION DATE: "What is the expiration date?" (Note: The label states when the prescription will expire, and thus can no longer be refilled.)     N/a 4. PRESCRIBING HCP: "Who prescribed it?" Reason: If prescribed by specialist, call should be referred to that group.     [place] 5. SYMPTOMS: "Do you have any symptoms?"     Pain  6. PREGNANCY: "Is there any chance that you are pregnant?" "When was your last menstrual period?"     No  Protocols used:  Medication Refill and Renewal Call-A-AH

## 2023-01-16 NOTE — Telephone Encounter (Signed)
-   Albuterol inhaler, Montelukast, and Trazodone prescribed.  - Tramadol prescribed 01/09/2023.

## 2023-01-16 NOTE — Telephone Encounter (Signed)
Prescription Request  01/16/2023  LOV: Visit date not found  What is the name of the medication or equipment? Albuterol Inhalers,Tramadol,Trazodone,Montelukast  Have you contacted your pharmacy to request a refill? No   Which pharmacy would you like this sent to?  [id number] [address], [place] - [address] [place] [address] Phone: [phone] Fax: [phone]    Patient notified that their request is being sent to the clinical staff for review and that they should receive a response within 2 business days.   Please advise at Mobile [phone] (mobile)

## 2023-01-17 ENCOUNTER — Other Ambulatory Visit: Payer: Self-pay | Admitting: Physician Assistant

## 2023-01-17 ENCOUNTER — Other Ambulatory Visit: Payer: Self-pay | Admitting: Family

## 2023-01-17 DIAGNOSIS — G8929 Other chronic pain: Secondary | ICD-10-CM

## 2023-01-17 DIAGNOSIS — J4540 Moderate persistent asthma, uncomplicated: Secondary | ICD-10-CM

## 2023-01-17 DIAGNOSIS — Z9981 Dependence on supplemental oxygen: Secondary | ICD-10-CM

## 2023-01-17 MED ORDER — TRAMADOL HCL 50 MG PO TABS
50.0000 mg | ORAL_TABLET | Freq: Four times a day (QID) | ORAL | 0 refills | Status: DC | PRN
Start: 2023-01-17 — End: 2023-02-02

## 2023-01-17 NOTE — Telephone Encounter (Signed)
-   Tramadol prescribed.  - Montelukast and Trazodone prescribed 01/16/2023.

## 2023-01-22 ENCOUNTER — Other Ambulatory Visit: Payer: Self-pay

## 2023-01-22 DIAGNOSIS — Z9981 Dependence on supplemental oxygen: Secondary | ICD-10-CM

## 2023-01-22 DIAGNOSIS — J4540 Moderate persistent asthma, uncomplicated: Secondary | ICD-10-CM

## 2023-01-22 IMAGING — CR DG HIP (WITH OR WITHOUT PELVIS) 2-3V*R*
5 series · 5 of 5 positions shown · non-contrast
Comparison: None Available.

CLINICAL DATA: Fall with right hip pain

EXAM:
DG HIP (WITH OR WITHOUT PELVIS) 2-3V RIGHT

[x pelvis (1 of 2)]
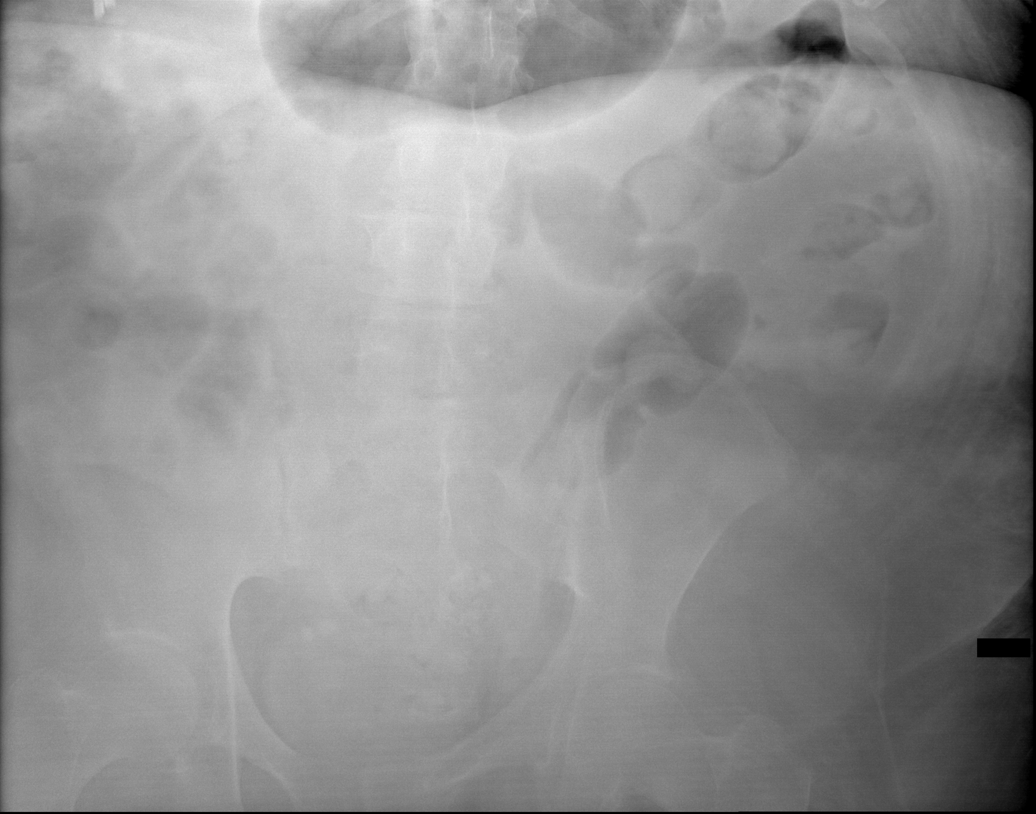

[x pelvis (2 of 2)]
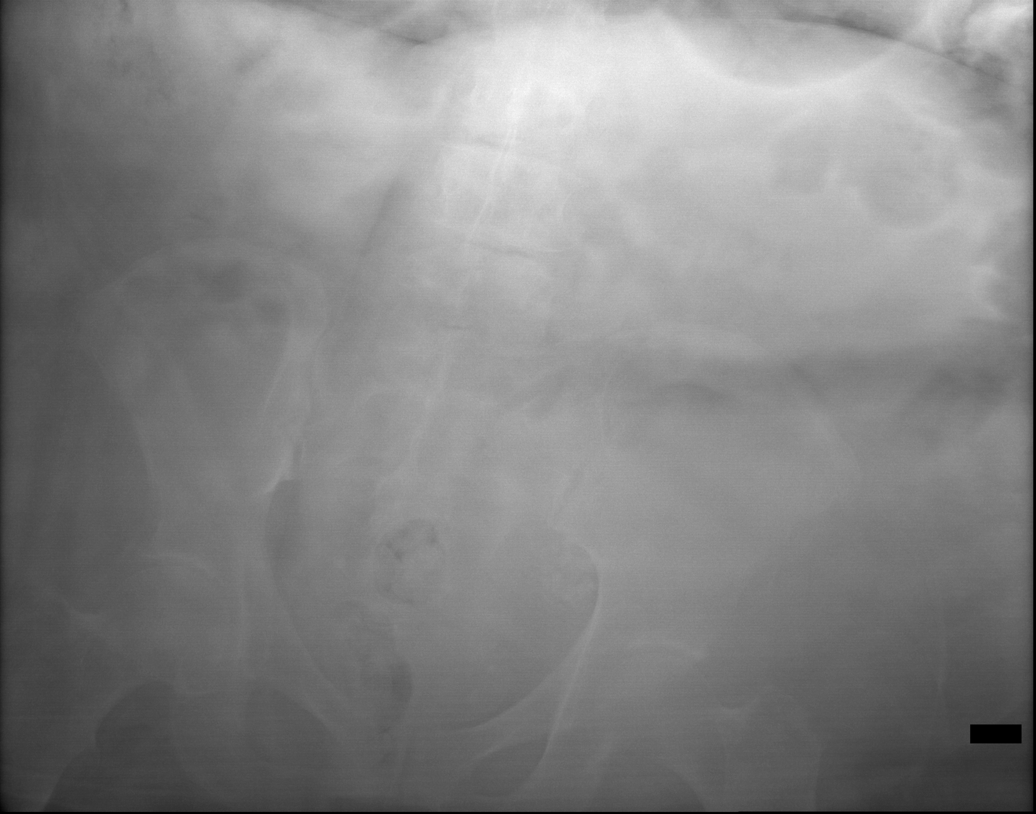

[x hip ap right (1 of 3)]
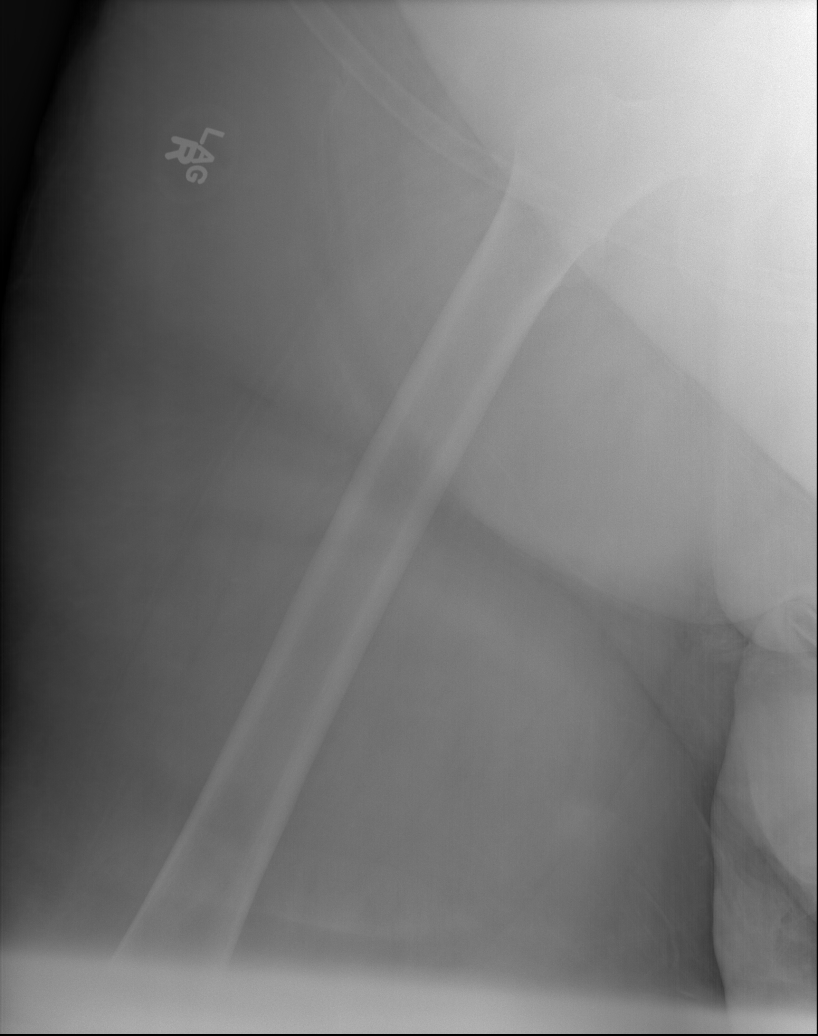

[x hip ap right (2 of 3)]
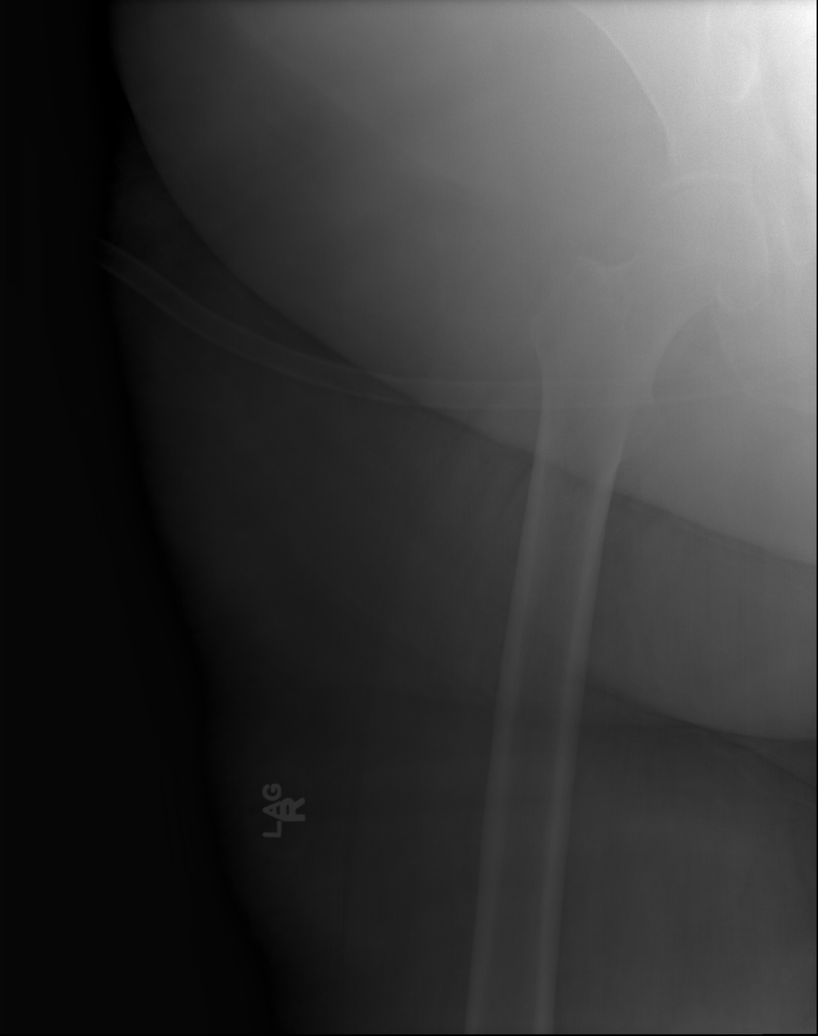

[x hip ap right (3 of 3)]
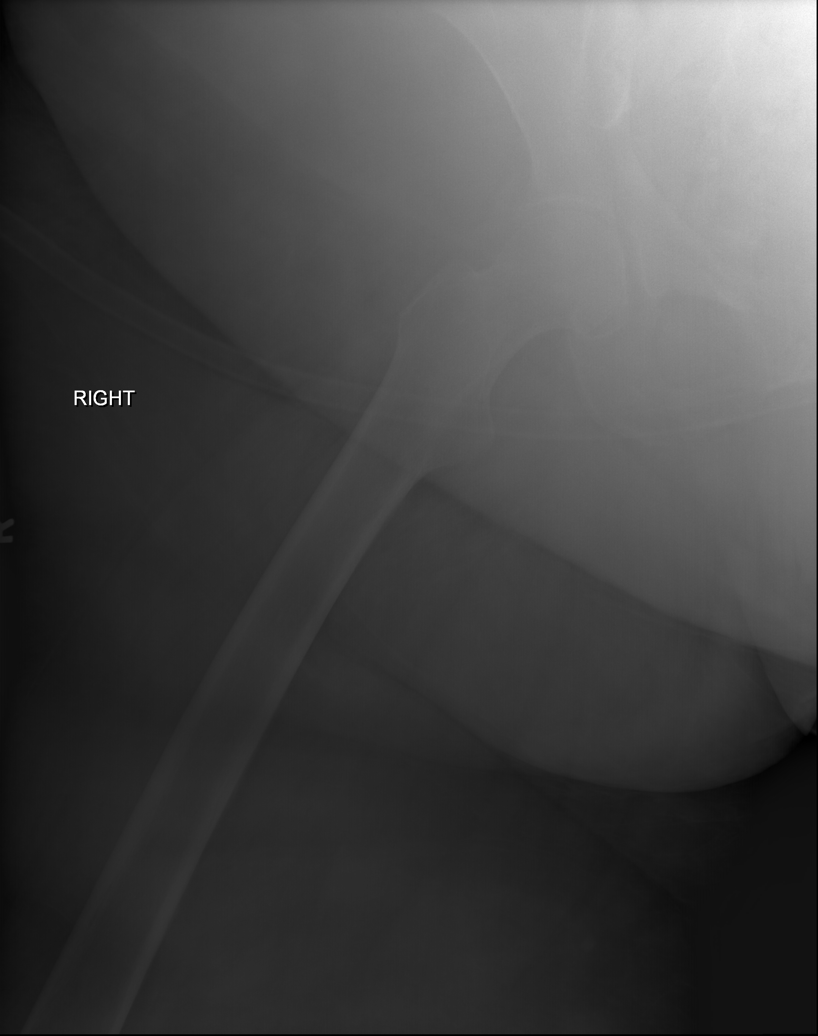

[5 of 5 positions shown; findings below may reference images not displayed]

FINDINGS: Very limited sensitivity due to body habitus. No detected fracture
or diastasis of the hip and pelvis.
IMPRESSION: Very limited study due to body habitus.  No visible injury.

## 2023-01-22 MED ORDER — ALBUTEROL SULFATE (2.5 MG/3ML) 0.083% IN NEBU: 2.5000 mg | INHALATION_SOLUTION | Freq: Four times a day (QID) | RESPIRATORY_TRACT | 2 refills | Status: DC | PRN

## 2023-02-01 ENCOUNTER — Other Ambulatory Visit: Payer: Self-pay | Admitting: Family

## 2023-02-01 DIAGNOSIS — G8929 Other chronic pain: Secondary | ICD-10-CM

## 2023-02-02 ENCOUNTER — Ambulatory Visit: Payer: Self-pay | Admitting: *Deleted

## 2023-02-02 ENCOUNTER — Other Ambulatory Visit: Payer: Self-pay | Admitting: Family

## 2023-02-02 DIAGNOSIS — G8929 Other chronic pain: Secondary | ICD-10-CM

## 2023-02-02 MED ORDER — TRAMADOL HCL 50 MG PO TABS
50.0000 mg | ORAL_TABLET | Freq: Four times a day (QID) | ORAL | 0 refills | Status: DC | PRN
Start: 2023-02-02 — End: 2023-02-16

## 2023-02-02 NOTE — Telephone Encounter (Signed)
Complete

## 2023-02-02 NOTE — Telephone Encounter (Signed)
Prescribed 02/02/2023.

## 2023-02-02 NOTE — Telephone Encounter (Signed)
  Chief Complaint: Needing a refill of the Tramadol for her knee pain until seen by ortho on 02/19/2023 Symptoms: pain in knee   Already been evaluated by Ricky Stabs, NP per pt. Frequency: daily Pertinent Negatives: Patient denies N/A Disposition: [] ED /[] Urgent Care (no appt availability in office) / [] Appointment(In office/virtual)/ []  [place]/ [] Home Care/ [] Refused Recommended Disposition /[] [place] [facility]/ [x]  Follow-up with PCP Additional Notes: Message sent to Ricky Stabs, NP      Pt may also go to the ED at [facility] location to the ortho walk in.

## 2023-02-02 NOTE — Telephone Encounter (Signed)
Reason for Disposition  [1] MODERATE pain (e.g., interferes with normal activities, limping) AND [2] present > 3 days    Needs refill of Tramadol from Any Zonia Kief, NP until seen by ortho on 02/19/2023.  Answer Assessment - Initial Assessment Questions 1. LOCATION and RADIATION: "Where is the pain located?"      [name] prescribed Tramadol for my knee.   I fell and irritated my knee again. I'm to go into emergency ortho but my aunt passed so I wasn't able to go to that appt. I'm having to wait until Nov. 18 to see an ortho dr. Now.   I don't have any pain medicine. I called the ED to see if they can help me but they told me there was a very long wait. I was going to pain management but they said it would be 4 weeks to be seen. 2. QUALITY: "What does the pain feel like?"  (e.g., sharp, dull, aching, burning)     I'm having severe pain in my knee.      I need a prescription for the  Tramadol to get me through until I see the ortho dr on 11/18.   [name] [address]   I'll send Ricky Stabs, NP a message.  Pt said she may try going to the ortho ED at the [facility] location today if possible.   Transportation is an issue.   She's in a wheelchair.  3. SEVERITY: "How bad is the pain?" "What does it keep you from doing?"   (Scale 1-10; or mild, moderate, severe)   -  MILD (1-3): doesn't interfere with normal activities    -  MODERATE (4-7): interferes with normal activities (e.g., work or school) or awakens from sleep, limping    -  SEVERE (8-10): excruciating pain, unable to do any normal activities, unable to walk     Pain.   Very difficult to walk. 4. ONSET: "When did the pain start?" "Does it come and go, or is it there all the time?"     When I  injured my knee 5. RECURRENT: "Have you had this pain before?" If Yes, ask: "When, and what happened then?"     Not asked 6. SETTING: "Has there been any recent work, exercise or other activity that involved that part of the  body?"      Not asked since been evaluated for this. 7. AGGRAVATING FACTORS: "What makes the knee pain worse?" (e.g., walking, climbing stairs, running)     walking 8. ASSOCIATED SYMPTOMS: "Is there any swelling or redness of the knee?"     Not asked 9. OTHER SYMPTOMS: "Do you have any other symptoms?" (e.g., chest pain, difficulty breathing, fever, calf pain)     Just pain in knee because out of pain medication. 10. PREGNANCY: "Is there any chance you are pregnant?" "When was your last menstrual period?"       N/A due to age  Protocols used: Knee Pain-A-AH

## 2023-02-05 NOTE — Telephone Encounter (Signed)
Prescribed 02/02/2023.

## 2023-02-06 ENCOUNTER — Ambulatory Visit: Payer: Self-pay

## 2023-02-06 ENCOUNTER — Encounter: Payer: 59 | Admitting: Family

## 2023-02-06 ENCOUNTER — Other Ambulatory Visit: Payer: Self-pay

## 2023-02-06 DIAGNOSIS — G8929 Other chronic pain: Secondary | ICD-10-CM

## 2023-02-06 DIAGNOSIS — M545 Low back pain, unspecified: Secondary | ICD-10-CM

## 2023-02-06 NOTE — Telephone Encounter (Signed)
Report to Emergency Department/Urgent Care/call 911 for immediate medical evaluation. Follow-up with Primary Care.

## 2023-02-06 NOTE — Telephone Encounter (Signed)
  Chief Complaint: Fall on Saturday - back pain mouth pain Symptoms: above Frequency: Saturday Pertinent Negatives: Patient denies  Disposition: [x] ED /[] Urgent Care (no appt availability in office) / [] Appointment(In office/virtual)/ []  [facility]/ [] Home Care/ [] Refused Recommended Disposition /[] [facility]/ []  Follow-up with PCP Additional Notes: Pt called to let provider know that she is going to ED for care. Pt fell Saturday in the bathroom while mopping. Pt states she nearly did a split. She also hit her mouth on the tub, and now cannot stop drooling. Pt states she cannot stand and is using incontinence products because of this. Pt is also requesting a refill of Meloxicam.  Reason for Disposition  [1] Follow-up call from patient regarding patient's clinical status AND [2] information urgent  Answer Assessment - Initial Assessment Questions 1. REASON FOR CALL or QUESTION: "What is your reason for calling today?" or "How can I best help you?" or "What question do you have that I can help answer?"     Pt fell Saturday. Pt is in a lot of pain in her back. She also hit her mouth on the tub with fall and cannot stop drooling.  Answer Assessment - Initial Assessment Questions 1. REASON FOR CALL or QUESTION: "What is your reason for calling today?" or "How can I best help you?" or "What question do you have that I can help answer?"     Pt is going to ED for care 2. CALLER: Document the source of call. (e.g., laboratory, patient).     Pt  Protocols used: Information Only Call - No Triage-A-AH, PCP Call - No Triage-A-AH

## 2023-02-07 ENCOUNTER — Emergency Department (HOSPITAL_COMMUNITY): Payer: 59

## 2023-02-07 ENCOUNTER — Inpatient Hospital Stay (HOSPITAL_COMMUNITY)
Admission: EM | Admit: 2023-02-07 | Discharge: 2023-02-16 | DRG: 189 | Disposition: A | Discharge status: Skilled Nursing Facility | Payer: 59 | Attending: Internal Medicine | Admitting: Internal Medicine

## 2023-02-07 ENCOUNTER — Telehealth: Payer: Self-pay | Admitting: *Deleted

## 2023-02-07 ENCOUNTER — Other Ambulatory Visit: Payer: Self-pay

## 2023-02-07 DIAGNOSIS — J45901 Unspecified asthma with (acute) exacerbation: Secondary | ICD-10-CM

## 2023-02-07 DIAGNOSIS — J9622 Acute and chronic respiratory failure with hypercapnia: Secondary | ICD-10-CM | POA: Diagnosis not present

## 2023-02-07 DIAGNOSIS — F411 Generalized anxiety disorder: Secondary | ICD-10-CM

## 2023-02-07 DIAGNOSIS — E662 Morbid (severe) obesity with alveolar hypoventilation: Secondary | ICD-10-CM

## 2023-02-07 DIAGNOSIS — J9621 Acute and chronic respiratory failure with hypoxia: Secondary | ICD-10-CM | POA: Diagnosis not present

## 2023-02-07 DIAGNOSIS — Z7951 Long term (current) use of inhaled steroids: Secondary | ICD-10-CM

## 2023-02-07 DIAGNOSIS — Z8249 Family history of ischemic heart disease and other diseases of the circulatory system: Secondary | ICD-10-CM

## 2023-02-07 DIAGNOSIS — M1611 Unilateral primary osteoarthritis, right hip: Secondary | ICD-10-CM | POA: Diagnosis present

## 2023-02-07 DIAGNOSIS — W010XXA Fall on same level from slipping, tripping and stumbling without subsequent striking against object, initial encounter: Secondary | ICD-10-CM | POA: Diagnosis present

## 2023-02-07 DIAGNOSIS — G8929 Other chronic pain: Secondary | ICD-10-CM | POA: Diagnosis present

## 2023-02-07 DIAGNOSIS — Z888 Allergy status to other drugs, medicaments and biological substances status: Secondary | ICD-10-CM

## 2023-02-07 DIAGNOSIS — G9341 Metabolic encephalopathy: Secondary | ICD-10-CM | POA: Diagnosis present

## 2023-02-07 DIAGNOSIS — G4733 Obstructive sleep apnea (adult) (pediatric): Secondary | ICD-10-CM

## 2023-02-07 DIAGNOSIS — W19XXXA Unspecified fall, initial encounter: Principal | ICD-10-CM

## 2023-02-07 DIAGNOSIS — M1711 Unilateral primary osteoarthritis, right knee: Secondary | ICD-10-CM | POA: Diagnosis present

## 2023-02-07 DIAGNOSIS — F32A Depression, unspecified: Secondary | ICD-10-CM | POA: Diagnosis present

## 2023-02-07 DIAGNOSIS — Z96652 Presence of left artificial knee joint: Secondary | ICD-10-CM | POA: Diagnosis present

## 2023-02-07 DIAGNOSIS — Z23 Encounter for immunization: Secondary | ICD-10-CM

## 2023-02-07 DIAGNOSIS — Z6841 Body Mass Index (BMI) 40.0 and over, adult: Secondary | ICD-10-CM

## 2023-02-07 DIAGNOSIS — Z79899 Other long term (current) drug therapy: Secondary | ICD-10-CM

## 2023-02-07 DIAGNOSIS — Z9981 Dependence on supplemental oxygen: Secondary | ICD-10-CM

## 2023-02-07 DIAGNOSIS — Y92019 Unspecified place in single-family (private) house as the place of occurrence of the external cause: Secondary | ICD-10-CM

## 2023-02-07 DIAGNOSIS — R410 Disorientation, unspecified: Secondary | ICD-10-CM | POA: Diagnosis not present

## 2023-02-07 DIAGNOSIS — I10 Essential (primary) hypertension: Secondary | ICD-10-CM | POA: Diagnosis present

## 2023-02-07 DIAGNOSIS — Z993 Dependence on wheelchair: Secondary | ICD-10-CM

## 2023-02-07 DIAGNOSIS — N179 Acute kidney failure, unspecified: Secondary | ICD-10-CM | POA: Diagnosis not present

## 2023-02-07 DIAGNOSIS — J45909 Unspecified asthma, uncomplicated: Secondary | ICD-10-CM | POA: Diagnosis present

## 2023-02-07 DIAGNOSIS — J4489 Other specified chronic obstructive pulmonary disease: Secondary | ICD-10-CM | POA: Diagnosis present

## 2023-02-07 DIAGNOSIS — Z882 Allergy status to sulfonamides status: Secondary | ICD-10-CM

## 2023-02-07 DIAGNOSIS — M549 Dorsalgia, unspecified: Secondary | ICD-10-CM | POA: Diagnosis present

## 2023-02-07 LAB — CBC WITH DIFFERENTIAL/PLATELET
Abs Immature Granulocytes: 0.04 10*3/uL (ref 0.00–0.07)
Basophils Absolute: 0 10*3/uL (ref 0.0–0.1)
Basophils Relative: 0 %
Eosinophils Absolute: 0.1 10*3/uL (ref 0.0–0.5)
Eosinophils Relative: 1 %
HCT: 37.9 % (ref 36.0–46.0)
Hemoglobin: 11.3 g/dL — ABNORMAL LOW (ref 12.0–15.0)
Immature Granulocytes: 0 %
Lymphocytes Relative: 23 %
Lymphs Abs: 2.4 10*3/uL (ref 0.7–4.0)
MCH: 29.1 pg (ref 26.0–34.0)
MCHC: 29.8 g/dL — ABNORMAL LOW (ref 30.0–36.0)
MCV: 97.7 fL (ref 80.0–100.0)
Monocytes Absolute: 0.8 10*3/uL (ref 0.1–1.0)
Monocytes Relative: 8 %
Neutro Abs: 6.9 10*3/uL (ref 1.7–7.7)
Neutrophils Relative %: 68 %
Platelets: 259 10*3/uL (ref 150–400)
RBC: 3.88 MIL/uL (ref 3.87–5.11)
RDW: 12.5 % (ref 11.5–15.5)
WBC: 10.2 10*3/uL (ref 4.0–10.5)
nRBC: 0 % (ref 0.0–0.2)

## 2023-02-07 LAB — BLOOD GAS, VENOUS
Acid-Base Excess: 10.6 mmol/L — ABNORMAL HIGH (ref 0.0–2.0)
Bicarbonate: 38.6 mmol/L — ABNORMAL HIGH (ref 20.0–28.0)
O2 Saturation: 94.9 %
Patient temperature: 37
pCO2, Ven: 70 mm[Hg] — ABNORMAL HIGH (ref 44–60)
pH, Ven: 7.35 (ref 7.25–7.43)
pO2, Ven: 64 mm[Hg] — ABNORMAL HIGH (ref 32–45)

## 2023-02-07 LAB — COMPREHENSIVE METABOLIC PANEL
ALT: 11 U/L (ref 0–44)
AST: 12 U/L — ABNORMAL LOW (ref 15–41)
Albumin: 3.5 g/dL (ref 3.5–5.0)
Alkaline Phosphatase: 72 U/L (ref 38–126)
Anion gap: 8 (ref 5–15)
BUN: 17 mg/dL (ref 8–23)
CO2: 32 mmol/L (ref 22–32)
Calcium: 9.8 mg/dL (ref 8.9–10.3)
Chloride: 99 mmol/L (ref 98–111)
Creatinine, Ser: 1.13 mg/dL — ABNORMAL HIGH (ref 0.44–1.00)
GFR, Estimated: 54 mL/min — ABNORMAL LOW (ref >60)
Glucose, Bld: 120 mg/dL — ABNORMAL HIGH (ref 70–99)
Potassium: 4.1 mmol/L (ref 3.5–5.1)
Sodium: 139 mmol/L (ref 135–145)
Total Bilirubin: 0.7 mg/dL (ref <1.2)
Total Protein: 7.2 g/dL (ref 6.5–8.1)

## 2023-02-07 LAB — BRAIN NATRIURETIC PEPTIDE: B Natriuretic Peptide: 118.5 pg/mL — ABNORMAL HIGH (ref 0.0–100.0)

## 2023-02-07 MED ORDER — ACETAMINOPHEN 500 MG PO TABS
1000.0000 mg | ORAL_TABLET | Freq: Once | ORAL | Status: AC
  Administered 2023-02-07: 1000 mg via ORAL
  Filled 2023-02-07: qty 2

## 2023-02-07 MED ORDER — ACETAMINOPHEN 500 MG PO TABS
1000.0000 mg | ORAL_TABLET | Freq: Four times a day (QID) | ORAL | Status: DC | PRN
  Administered 2023-02-08: 1000 mg via ORAL
  Filled 2023-02-07: qty 2

## 2023-02-07 MED ORDER — LEVALBUTEROL HCL 0.63 MG/3ML IN NEBU
0.6300 mg | INHALATION_SOLUTION | Freq: Three times a day (TID) | RESPIRATORY_TRACT | Status: DC | PRN
  Administered 2023-02-08 – 2023-02-11 (×5): 0.63 mg via RESPIRATORY_TRACT
  Filled 2023-02-07 (×5): qty 3

## 2023-02-07 MED ORDER — ACETAMINOPHEN 325 MG PO TABS
650.0000 mg | ORAL_TABLET | Freq: Once | ORAL | Status: AC
  Administered 2023-02-07: 650 mg via ORAL
  Filled 2023-02-07: qty 2

## 2023-02-07 MED ORDER — OXYCODONE-ACETAMINOPHEN 5-325 MG PO TABS
1.0000 | ORAL_TABLET | Freq: Once | ORAL | Status: AC
  Administered 2023-02-07: 1 via ORAL
  Filled 2023-02-07: qty 1

## 2023-02-07 MED ORDER — GABAPENTIN 300 MG PO CAPS
300.0000 mg | ORAL_CAPSULE | Freq: Once | ORAL | Status: AC
  Administered 2023-02-07: 300 mg via ORAL
  Filled 2023-02-07: qty 1

## 2023-02-07 MED ORDER — IOHEXOL 350 MG/ML SOLN
80.0000 mL | Freq: Once | INTRAVENOUS | Status: AC | PRN
  Administered 2023-02-07: 100 mL via INTRAVENOUS

## 2023-02-07 NOTE — ED Provider Notes (Signed)
[facility]GENC[facility] DEPARTMENT AT [facility] Provider Note   CSN: [id number] Arrival date & time: 02/07/23  0347     History  Chief Complaint  Patient presents with   Janice Ryan is a 67 y.o. female.  67 year old female presents today for concern of a fall that occurred this morning.  She states she was stepping out of the shower when she did a split like movement causing her to hit her head and her back.  She also states this exacerbated her chronic pain particularly in the right knee, and spine.  She states she took gabapentin a few hours ago and she feels the effects of that are wearing off.  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  The history is provided by the patient. No language interpreter was used.       Home Medications Prior to Admission medications   Medication Sig Start Date End Date Taking? Authorizing Provider  acetaminophen (TYLENOL) 500 MG tablet Take 2,000 mg by mouth daily as needed for mild pain or headache.    [provider]  albuterol (PROVENTIL) (2.5 MG/3ML) 0.083% nebulizer solution Take 3 mLs (2.5 mg total) by nebulization every 6 (six) hours as needed for wheezing or shortness of breath. 01/22/23   [name], NP  albuterol (VENTOLIN HFA) 108 (90 Base) MCG/ACT inhaler Inhale 1-2 puffs into the lungs every 6 (six) hours as needed for wheezing or shortness of breath. 09/18/22   [name], NP  albuterol (VENTOLIN HFA) 108 (90 Base) MCG/ACT inhaler Inhale 2 puffs into the lungs every 6 (six) hours as needed for wheezing or shortness of breath. 01/16/23   [name], NP  amoxicillin-clavulanate (AUGMENTIN) 875-125 MG tablet TAKE 1 TABLET BY MOUTH TWICE A DAY FOR 5 DAYS 11/06/22   [name], NP  amphetamine-dextroamphetamine (ADDERALL XR) 20 MG 24 hr capsule Take 20 mg by mouth daily.    [provider]  busPIRone (BUSPAR) 5 MG tablet TAKE ONE TABLET BY MOUTH THREE TIMES A  DAY Patient taking differently: Take 5 mg by mouth 3 (three) times daily. 01/25/20   Fulp, Cammie, MD  carbamide peroxide (DEBROX) 6.5 % OTIC solution Place 2-4 drops into both ears 2 (two) times daily as needed (to clean ears).    [provider]  cetirizine (ZYRTEC ALLERGY) 10 MG tablet Take 1 tablet (10 mg total) by mouth daily. 01/03/23   [name], NP  cetirizine (ZYRTEC) 10 MG tablet TAKE 1 TABLET BY MOUTH EVERY DAY 01/03/23   [name], NP  Cholecalciferol (VITAMIN D3 PO) Take 1 tablet by mouth daily.    [provider]  diclofenac Sodium (VOLTAREN) 1 % GEL Apply 2 g topically 4 (four) times daily as needed. 04/02/19   Burnadette Pop, MD  DULoxetine (CYMBALTA) 60 MG capsule Take 1 capsule (60 mg total) by mouth daily. Patient taking differently: Take 120 mg by mouth daily. 06/26/19   Fulp, Cammie, MD  fluticasone (FLONASE) 50 MCG/ACT nasal spray Place 2 sprays into both nostrils daily. 02/20/22   Billey Co, MD  fluticasone-salmeterol (ADVAIR) 500-50 MCG/ACT AEPB Inhale 1 puff into the lungs in the morning and at bedtime. 08/01/22   Mayers, Cari S, PA-C  gabapentin (NEURONTIN) 300 MG capsule TAKE 1 CAPSULE BY MOUTH EVERYDAY AT BEDTIME 12/05/22   Zonia Kief, [name] J, NP  lisinopril (ZESTRIL) 20 MG tablet TAKE 1 TABLET BY MOUTH EVERY DAY 12/05/22  Zonia Kief, [name] J, NP  meloxicam (MOBIC) 7.5 MG tablet Take 1 tablet (7.5 mg total) by mouth daily. As needed for pain; take after eating Patient taking differently: Take 7.5 mg by mouth daily as needed for pain. 09/26/19   Marcine Matar, MD  montelukast (SINGULAIR) 10 MG tablet Take 1 tablet (10 mg total) by mouth at bedtime. 01/16/23   [name], NP  Multiple Vitamin (MULTIVITAMIN WITH MINERALS) TABS tablet Take 1 tablet by mouth daily.    [provider]  Omega-3 Fatty Acids (OMEGA-3 PO) Take 1 capsule by mouth daily.    [provider]  OXYGEN Inhale into the lungs at bedtime as needed (2 L).     [provider]  traMADol (ULTRAM) 50 MG tablet Take 1 tablet (50 mg total) by mouth every 6 (six) hours as needed. 02/02/23   [name], NP  traZODone (DESYREL) 50 MG tablet Take 1 tablet (50 mg total) by mouth at bedtime. 01/16/23   [name], NP      Allergies    Neurontin [gabapentin] and Sulfa antibiotics    Review of Systems   Review of Systems  Constitutional:  Negative for fever.  Musculoskeletal:  Positive for arthralgias and back pain.  Neurological:  Negative for syncope.  All other systems reviewed and are negative.   Physical Exam Updated Vital Signs BP (!) 148/53 (BP Location: Right Arm)   Pulse 93   Temp 98.6 F (37 C) (Oral)   Resp (!) 22   SpO2 97%  Physical Exam Vitals and nursing note reviewed.  Constitutional:      General: She is not in acute distress.    Appearance: Normal appearance. She is obese. She is not ill-appearing.  HENT:     Head: Normocephalic and atraumatic.     Nose: Nose normal.  Eyes:     Conjunctiva/sclera: Conjunctivae normal.  Cardiovascular:     Rate and Rhythm: Normal rate.  Pulmonary:     Effort: Pulmonary effort is normal. No respiratory distress.  Musculoskeletal:        General: No deformity.  Skin:    Findings: No rash.  Neurological:     Mental Status: She is alert.     ED Results / Procedures / Treatments   Labs (all labs ordered are listed, but only abnormal results are displayed) Labs Reviewed - No data to display  EKG None  Radiology No results found.  Procedures Procedures    Medications Ordered in ED Medications  oxyCODONE-acetaminophen (PERCOCET/ROXICET) 5-325 MG per tablet 1 tablet (1 tablet Oral Given 02/07/23 1610)    ED Course/ Medical Decision Making/ A&P                                 Medical Decision Making Amount and/or Complexity of Data Reviewed Radiology: ordered.  Risk OTC drugs. Prescription drug management.   Medical Decision Making / ED  Course   This patient presents to the ED for concern of fall, back pain, knee pain, this involves an extensive number of treatment options, and is a complaint that carries with it a high risk of complications and morbidity.  The differential diagnosis includes acute on chronic pain, fracture of the knee, spine fracture  MDM: 67 year old female presents today for concern of a fall that occurred this morning.  Patient states she was getting out of the shower and fell backwards hitting her head  and back.  Complains of worsening pain to the right knee, leg pain which is chronic but worse after the fall.  No loss of consciousness.  Imaging obtained including CT head, CT cervical spine, CT L-spine, CT T-spine, CT pelvis, x-ray of the right knee, and right tib-fib.  No acute finding.  Patient states that she lives with her husband who is also ill and cannot take care of her.  She had her son visiting from college to help take care of her until she could set up better solution but she states after the fall she is not able to ambulate and does not feel safe going home.  She would like PT and OT eval and consideration for SNF placement.  Will place patient as an ED border until [facility] and PT and OT eval are complete.  PT, OT, TOC consult placed. Patient was requesting an MRI that needed to be done outpatient concern to be done today.  Advised that this will not be ordered through the emergency department she will need to follow-up with her orthopedist to have this performed.  She voices understanding.  Lab Tests: -I ordered, reviewed, and interpreted labs.   The pertinent results include:   Labs Reviewed - No data to display    EKG  EKG Interpretation Date/Time:    Ventricular Rate:    PR Interval:    QRS Duration:    QT Interval:    QTC Calculation:   R Axis:      Text Interpretation:           Imaging Studies ordered: I ordered imaging studies including CT head, CT cervical spine, CT  T-spine, CT L-spine, CT pelvis.  Right knee x-ray, right tib-fib x-ray I independently visualized and interpreted imaging. I agree with the radiologist interpretation   Medicines ordered and prescription drug management: Meds ordered this encounter  Medications   oxyCODONE-acetaminophen (PERCOCET/ROXICET) 5-325 MG per tablet 1 tablet   gabapentin (NEURONTIN) capsule 300 mg   acetaminophen (TYLENOL) tablet 650 mg    -I have reviewed the patients home medicines and have made adjustments as needed   Reevaluation: After the interventions noted above, I reevaluated the patient and found that they have :stayed the same  Co morbidities that complicate the patient evaluation  Past Medical History:  Diagnosis Date   Acute respiratory failure with hypoxia (HCC)    Asthma    Hypertension    Morbid obesity (HCC)    Obesity with alveolar hypoventilation and body mass index (BMI) of 40 or greater (HCC)    OSA (obstructive sleep apnea)       Dispostion: Patient placed as an ED border due to patient not feeling comfortable or safe to go home to care for herself.  No indication for admission at this time.   Final Clinical Impression(s) / ED Diagnoses Final diagnoses:  Fall, initial encounter  Chronic back pain, unspecified back location, unspecified back pain laterality  Chronic pain of right knee    Rx / DC Orders ED Discharge Orders     None         Marita Kansas, PA-C 02/07/23 1317    Elayne Snare K, DO 02/07/23 1438

## 2023-02-07 NOTE — H&P (Signed)
History and Physical    Janice Ryan ZOX:[id number] DOB: February 10, 1956 DOA: 02/07/2023  PCP: [name], NP  Patient coming from: Home  Chief Complaint: Fall  HPI: Janice Ryan is a 67 y.o. female with medical history significant of asthma, allergic rhinitis, morbid obesity, OSA/OHS, chronic hypoxic hypercapnic respiratory failure, chronic right knee pain presented to ED via EMS complaining of back and right knee/leg pain after a fall at home.  Labs notable for hemoglobin 11.3 (stable), BNP 118.  CT scans of head and cervical/thoracic/lumbar spine/pelvis negative for acute traumatic injuries.  X-rays of right knee, right tibia/fibula, and chest negative for acute traumatic injuries. Patient lives with her husband who is also ill and cannot take care of her.  She did not feel comfortable going home due to difficulty ambulating.  PT/OT and TOC were consulted for SNF placement.  Patient was given Tylenol, gabapentin, and Percocet in the ED.  Noted to be desatting to 60-70s when asleep in the ED and had difficulty waking up.  VBG showing pH 7.35 and pCO2 70.  Placed on BiPAP due to hypercapnia.  CTA chest negative for PE or pulmonary edema.  Showing dilation of main pulmonary trunk suggestive of pulmonary hypertension.  History limited as patient is currently on BiPAP.  She reports having a fall at home a few days ago and another fall this morning as she was trying to get out of her bathtub, slipped and fell hitting the back of her head and back.  Denies loss of consciousness.  She reports history of chronic back and right knee pain for which she takes duloxetine, gabapentin, meloxicam, and tramadol at home.  She is requesting her home pain medications.  Patient states she is not able to go home as she does not have help and had difficulty getting up from the floor after her fall today.  She also reports history of sleep apnea for which she does have a BiPAP machine at home but has not  been able to use it as the mask is not fitting her properly.  Also states that her insurance company would not pay for a new mask.  Patient states she was supposed to see a pulmonologist but missed several appointments.  She uses 5 L oxygen at night and 5 L during the day as needed.  Denies chest pain or shortness of breath.  No other complaints.  Review of Systems:  Review of Systems  All other systems reviewed and are negative.   Past Medical History:  Diagnosis Date   Acute respiratory failure with hypoxia (HCC)    Asthma    Hypertension    Morbid obesity (HCC)    Obesity with alveolar hypoventilation and body mass index (BMI) of 40 or greater (HCC)    OSA (obstructive sleep apnea)     Past Surgical History:  Procedure Laterality Date   HERNIA REPAIR     JOINT REPLACEMENT Left    knee     reports that she has never smoked. She has never used smokeless tobacco. She reports that she does not drink alcohol and does not use drugs.  Allergies  Allergen Reactions   Neurontin [Gabapentin] Other (See Comments)    Has un-controlled movement of her body, excessive dribble of saliva at night   Sulfa Antibiotics Nausea And Vomiting and Other (See Comments)    Stomach pain    Family History  Problem Relation Age of Onset   Atrial fibrillation Mother    Congestive Heart Failure  Father     Prior to Admission medications   Medication Sig Start Date End Date Taking? Authorizing Provider  acetaminophen (TYLENOL) 500 MG tablet Take 2,000 mg by mouth daily as needed for mild pain or headache.    [provider]  albuterol (PROVENTIL) (2.5 MG/3ML) 0.083% nebulizer solution Take 3 mLs (2.5 mg total) by nebulization every 6 (six) hours as needed for wheezing or shortness of breath. 01/22/23   [name], NP  albuterol (VENTOLIN HFA) 108 (90 Base) MCG/ACT inhaler Inhale 1-2 puffs into the lungs every 6 (six) hours as needed for wheezing or shortness of breath. 09/18/22   [name], NP  albuterol (VENTOLIN HFA) 108 (90 Base) MCG/ACT inhaler Inhale 2 puffs into the lungs every 6 (six) hours as needed for wheezing or shortness of breath. 01/16/23   [name], NP  amoxicillin-clavulanate (AUGMENTIN) 875-125 MG tablet TAKE 1 TABLET BY MOUTH TWICE A DAY FOR 5 DAYS 11/06/22   [name], NP  amphetamine-dextroamphetamine (ADDERALL XR) 20 MG 24 hr capsule Take 20 mg by mouth daily.    [provider]  busPIRone (BUSPAR) 5 MG tablet TAKE ONE TABLET BY MOUTH THREE TIMES A DAY Patient taking differently: Take 5 mg by mouth 3 (three) times daily. 01/25/20   Fulp, Cammie, MD  carbamide peroxide (DEBROX) 6.5 % OTIC solution Place 2-4 drops into both ears 2 (two) times daily as needed (to clean ears).    [provider]  cetirizine (ZYRTEC ALLERGY) 10 MG tablet Take 1 tablet (10 mg total) by mouth daily. 01/03/23   [name], NP  cetirizine (ZYRTEC) 10 MG tablet TAKE 1 TABLET BY MOUTH EVERY DAY 01/03/23   [name], NP  Cholecalciferol (VITAMIN D3 PO) Take 1 tablet by mouth daily.    [provider]  diclofenac Sodium (VOLTAREN) 1 % GEL Apply 2 g topically 4 (four) times daily as needed. 04/02/19   Burnadette Pop, MD  DULoxetine (CYMBALTA) 60 MG capsule Take 1 capsule (60 mg total) by mouth daily. Patient taking differently: Take 120 mg by mouth daily. 06/26/19   Fulp, Cammie, MD  fluticasone (FLONASE) 50 MCG/ACT nasal spray Place 2 sprays into both nostrils daily. 02/20/22   Billey Co, MD  fluticasone-salmeterol (ADVAIR) 500-50 MCG/ACT AEPB Inhale 1 puff into the lungs in the morning and at bedtime. 08/01/22   Mayers, Cari S, PA-C  gabapentin (NEURONTIN) 300 MG capsule TAKE 1 CAPSULE BY MOUTH EVERYDAY AT BEDTIME 12/05/22   Zonia Kief, [name] J, NP  lisinopril (ZESTRIL) 20 MG tablet TAKE 1 TABLET BY MOUTH EVERY DAY 12/05/22   [name], NP  meloxicam (MOBIC) 7.5 MG tablet Take 1 tablet (7.5 mg total) by mouth daily. As needed for pain;  take after eating Patient taking differently: Take 7.5 mg by mouth daily as needed for pain. 09/26/19   Marcine Matar, MD  montelukast (SINGULAIR) 10 MG tablet Take 1 tablet (10 mg total) by mouth at bedtime. 01/16/23   [name], NP  Multiple Vitamin (MULTIVITAMIN WITH MINERALS) TABS tablet Take 1 tablet by mouth daily.    [provider]  Omega-3 Fatty Acids (OMEGA-3 PO) Take 1 capsule by mouth daily.    [provider]  OXYGEN Inhale into the lungs at bedtime as needed (2 L).    [provider]  traMADol (ULTRAM) 50 MG tablet Take 1 tablet (50 mg total) by mouth every 6 (six) hours as needed. 02/02/23  [name], NP  traZODone (DESYREL) 50 MG tablet Take 1 tablet (50 mg total) by mouth at bedtime. 01/16/23   [name], NP    Physical Exam: Vitals:   02/07/23 1530 02/07/23 1855 02/07/23 2130 02/07/23 2206  BP: 132/84 137/89 (!) 157/107   Pulse: 100 (!) 104 (!) 101 (!) 102  Resp: 20 17 (!) 22   Temp:  98.7 F (37.1 C)    TempSrc:  Oral    SpO2: 91% 93% 92% 95%    Physical Exam Vitals reviewed.  Constitutional:      General: She is not in acute distress. HENT:     Head: Normocephalic and atraumatic.  Eyes:     Extraocular Movements: Extraocular movements intact.  Cardiovascular:     Rate and Rhythm: Normal rate and regular rhythm.     Pulses: Normal pulses.  Pulmonary:     Effort: Pulmonary effort is normal. No respiratory distress.     Breath sounds: Normal breath sounds. No wheezing or rales.  Abdominal:     General: Bowel sounds are normal.     Palpations: Abdomen is soft.     Tenderness: There is no abdominal tenderness. There is no guarding.  Musculoskeletal:     Cervical back: Normal range of motion.     Comments: Bilateral pedal edema  Skin:    General: Skin is warm and dry.  Neurological:     General: No focal deficit present.     Mental Status: She is alert and oriented to person, place, and time.     Cranial  Nerves: No cranial nerve deficit.     Sensory: No sensory deficit.     Motor: No weakness.     Labs on Admission: I have personally reviewed following labs and imaging studies  CBC: Recent Labs  Lab 02/07/23 1725  WBC 10.2  NEUTROABS 6.9  HGB 11.3*  HCT 37.9  MCV 97.7  PLT 259   Basic Metabolic Panel: Recent Labs  Lab 02/07/23 1725  NA 139  K 4.1  CL 99  CO2 32  GLUCOSE 120*  BUN 17  CREATININE 1.13*  CALCIUM 9.8   GFR: CrCl cannot be calculated (Unknown ideal weight.). Liver Function Tests: Recent Labs  Lab 02/07/23 1725  AST 12*  ALT 11  ALKPHOS 72  BILITOT 0.7  PROT 7.2  ALBUMIN 3.5   No results for input(s): "LIPASE", "AMYLASE" in the last 168 hours. No results for input(s): "AMMONIA" in the last 168 hours. Coagulation Profile: No results for input(s): "INR", "PROTIME" in the last 168 hours. Cardiac Enzymes: No results for input(s): "CKTOTAL", "CKMB", "CKMBINDEX", "TROPONINI" in the last 168 hours. BNP (last 3 results) No results for input(s): "PROBNP" in the last 8760 hours. HbA1C: No results for input(s): "HGBA1C" in the last 72 hours. CBG: No results for input(s): "GLUCAP" in the last 168 hours. Lipid Profile: No results for input(s): "CHOL", "HDL", "LDLCALC", "TRIG", "CHOLHDL", "LDLDIRECT" in the last 72 hours. Thyroid Function Tests: No results for input(s): "TSH", "T4TOTAL", "FREET4", "T3FREE", "THYROIDAB" in the last 72 hours. Anemia Panel: No results for input(s): "VITAMINB12", "FOLATE", "FERRITIN", "TIBC", "IRON", "RETICCTPCT" in the last 72 hours. Urine analysis:    Component Value Date/Time   COLORURINE YELLOW 01/20/2022 1445   APPEARANCEUR CLEAR 01/20/2022 1445   LABSPEC 1.015 01/20/2022 1445   PHURINE 5.0 01/20/2022 1445   GLUCOSEU NEGATIVE 01/20/2022 1445   HGBUR MODERATE (A) 01/20/2022 1445   BILIRUBINUR NEGATIVE 01/20/2022 1445   KETONESUR NEGATIVE  01/20/2022 1445   PROTEINUR NEGATIVE 01/20/2022 1445   NITRITE NEGATIVE  01/20/2022 1445   LEUKOCYTESUR NEGATIVE 01/20/2022 1445    Radiological Exams on Admission: CT Angio Chest PE W and/or Wo Contrast  Result Date: 02/07/2023 CLINICAL DATA:  Concern for pulmonary embolism. EXAM: CT ANGIOGRAPHY CHEST WITH CONTRAST TECHNIQUE: Multidetector CT imaging of the chest was performed using the standard protocol during bolus administration of intravenous contrast. Multiplanar CT image reconstructions and MIPs were obtained to evaluate the vascular anatomy. RADIATION DOSE REDUCTION: This exam was performed according to the departmental dose-optimization program which includes automated exposure control, adjustment of the mA and/or kV according to patient size and/or use of iterative reconstruction technique. CONTRAST:  OMNIPAQUE IOHEXOL 350 MG/ML SOLN COMPARISON:  Chest CT dated 03/22/2019. FINDINGS: Cardiovascular: Top-normal cardiac size. No pericardial effusion. There is lipomatous hypertrophy of the interatrial septum. Mild atherosclerotic calcification of the thoracic aorta. No aneurysmal dilatation or dissection. The origins of the great vessels of the aortic arch appear patent. There is dilatation of the main pulmonary trunk suggestive of pulmonary hypertension. No pulmonary artery embolus identified Mediastinum/Nodes: No hilar or mediastinal adenopathy. The esophagus is grossly unremarkable. Stable 2.5 cm rounded lesion in the anterior mediastinum, likely a benign process. No mediastinal fluid collection. Lungs/Pleura: Right lung base linear atelectasis/scarring. No focal consolidation, pleural effusion, or pneumothorax. The central airways are patent. Upper Abdomen: Noncalcified gallstones. Musculoskeletal: Osteopenia with degenerative changes. No acute osseous pathology. Review of the MIP images confirms the above findings. IMPRESSION: 1. No acute intrathoracic pathology. No CT evidence of pulmonary embolism. 2. Dilatation of the main pulmonary trunk suggestive of  pulmonary hypertension. 3. Cholelithiasis. 4.  Aortic Atherosclerosis (ICD10-I70.0). Electronically Signed   By: Elgie Collard M.D.   On: 02/07/2023 20:54   DG Chest Port 1 View  Result Date: 02/07/2023 CLINICAL DATA:  Shortness of breath EXAM: PORTABLE CHEST 1 VIEW COMPARISON:  01/23/2022 FINDINGS: Cardiomegaly. No frank interstitial edema. No pleural effusion or pneumothorax. IMPRESSION: Cardiomegaly. No frank interstitial edema. Electronically Signed   By: Charline Bills M.D.   On: 02/07/2023 19:19   DG Tibia/Fibula Right  Result Date: 02/07/2023 CLINICAL DATA:  Larey Seat when washing her hair.  Pain. EXAM: PORTABLE RIGHT KNEE - 1-2 VIEW; RIGHT TIBIA AND FIBULA - 2 VIEW COMPARISON:  Right knee radiographs 01/20/2022, right tibia and fibula radiographs 08/25/2021 FINDINGS: Right knee: There is diffuse decreased bone mineralization. Bone-on-bone contact of the medial compartment. Large peripheral medial degenerative osteophytes. Moderate peripheral lateral compartment degenerative osteophytosis without joint space narrowing. Degenerative genu varum again noted. Severe patellofemoral joint space narrowing bone-on-bone contact and large peripheral osteophytes. There is an impaction of 14 mm ossicle within the suprapatellar joint space. Right tibia and fibula: There is a 6 mm chronic well corticated ossicle distal to the fibula, likely the sequela of remote trauma. Mild medial greater than lateral tibiotalar joint space narrowing. Mild distal anterior tibial plafond degenerative osteophytosis. Mild navicular-cuneiform joint space narrowing and dorsal osteophytosis on lateral view. No acute fracture or dislocation. IMPRESSION: 1. Severe medial and patellofemoral compartment osteoarthritis, similar to prior. 2. Mild tibiotalar and navicular-cuneiform osteoarthritis. 3. No acute fracture. Electronically Signed   By: Neita [name] M.D.   On: 02/07/2023 09:33   DG Knee Right Port  Result Date:  02/07/2023 CLINICAL DATA:  Larey Seat when washing her hair.  Pain. EXAM: PORTABLE RIGHT KNEE - 1-2 VIEW; RIGHT TIBIA AND FIBULA - 2 VIEW COMPARISON:  Right knee radiographs 01/20/2022, right tibia and fibula radiographs 08/25/2021  FINDINGS: Right knee: There is diffuse decreased bone mineralization. Bone-on-bone contact of the medial compartment. Large peripheral medial degenerative osteophytes. Moderate peripheral lateral compartment degenerative osteophytosis without joint space narrowing. Degenerative genu varum again noted. Severe patellofemoral joint space narrowing bone-on-bone contact and large peripheral osteophytes. There is an impaction of 14 mm ossicle within the suprapatellar joint space. Right tibia and fibula: There is a 6 mm chronic well corticated ossicle distal to the fibula, likely the sequela of remote trauma. Mild medial greater than lateral tibiotalar joint space narrowing. Mild distal anterior tibial plafond degenerative osteophytosis. Mild navicular-cuneiform joint space narrowing and dorsal osteophytosis on lateral view. No acute fracture or dislocation. IMPRESSION: 1. Severe medial and patellofemoral compartment osteoarthritis, similar to prior. 2. Mild tibiotalar and navicular-cuneiform osteoarthritis. 3. No acute fracture. Electronically Signed   By: Neita [name] M.D.   On: 02/07/2023 09:33   CT PELVIS WO CONTRAST  Result Date: 02/07/2023 CLINICAL DATA:  fall EXAM: CT PELVIS WITHOUT CONTRAST TECHNIQUE: Multidetector CT imaging of the pelvis was performed following the standard protocol without intravenous contrast. RADIATION DOSE REDUCTION: This exam was performed according to the departmental dose-optimization program which includes automated exposure control, adjustment of the mA and/or kV according to patient size and/or use of iterative reconstruction technique. COMPARISON:  None Available. FINDINGS: Urinary Tract:  No abnormality visualized. Bowel:  Unremarkable visualized pelvic  bowel loops. Vascular/Lymphatic: No pathologically enlarged lymph nodes. No significant vascular abnormality seen. Reproductive:  Fibroid uterus. Musculoskeletal: No acute osseous abnormality. The bilateral femoral heads are seated in the acetabula. Degenerative changes of the bilateral hips with superolateral joint space narrowing, marginal osteophytosis and subchondral cystic changes. The sacroiliac joints and pubic symphysis are anatomically aligned with degenerative changes. Multilevel degenerative changes of the visualized lower lumbar spine. IMPRESSION: 1. No acute osseous abnormality. 2. Moderate osteoarthritis of the hips. Electronically Signed   By: Hart Robinsons M.D.   On: 02/07/2023 09:14   CT Thoracic Spine Wo Contrast  Result Date: 02/07/2023 CLINICAL DATA:  Provided history: Fall.  Mid back pain.  Leg pain. EXAM: CT THORACIC SPINE WITHOUT CONTRAST TECHNIQUE: Multidetector CT images of the thoracic were obtained using the standard protocol without intravenous contrast. RADIATION DOSE REDUCTION: This exam was performed according to the departmental dose-optimization program which includes automated exposure control, adjustment of the mA and/or kV according to patient size and/or use of iterative reconstruction technique. COMPARISON:  Chest CT 03/22/2019. FINDINGS: Alignment: No significant spondylolisthesis. Vertebrae: Mild chronic anterior wedge deformity of the T7 vertebral body, unchanged from the prior chest CT of 03/22/2019. Vertebral body height is maintained at the remaining thoracic levels. No evidence of an acute fracture to the thoracic spine. Osseous fusion across the disc space at T8-T9. Early osseous fusion also suspected across the T9-T10 disc space. Ventrolateral osteophytes at T6-T7, T7-T8, T8-T9 and T9-T10. Hemangiomas within the T3 and T5 vertebrae. Paraspinal and other soft tissues: No acute finding within included portions of the thorax or upper abdomen/retroperitoneum. Aortic  atherosclerosis. No paraspinal mass or collection. Disc levels: Osseous fusion across the disc space at T8-T9. Early osseous fusion also suspected across the T9-T10 disc space. Disc space narrowing elsewhere within the thoracic spine, greatest at T7-T8 (moderate-to-advanced at this level). No significant spinal canal stenosis is appreciated. Endplate spurring results in bilateral bony neural foraminal narrowing at T9-T10 (moderate right, mild left). IMPRESSION: 1. No evidence of an acute thoracic spine fracture. 2. Mild chronic anterior wedge deformity of the T7 vertebral body, unchanged from the prior chest  CT of 03/22/2019. 3. Thoracic spondylosis as described. No significant spinal canal stenosis is appreciated. Endplate spurring results in bilateral bony neural foraminal narrowing at T9-T10 (moderate right, mild left). Osseous fusion across the T8-T9 disc space. Early osseous fusion also suspected across the T9-T10 disc space. Electronically Signed   By: Jackey Loge D.O.   On: 02/07/2023 08:32   CT Lumbar Spine Wo Contrast  Result Date: 02/07/2023 CLINICAL DATA:  67 year old female status post fall with pain. EXAM: CT LUMBAR SPINE WITHOUT CONTRAST TECHNIQUE: Multidetector CT imaging of the lumbar spine was performed without intravenous contrast administration. Multiplanar CT image reconstructions were also generated. RADIATION DOSE REDUCTION: This exam was performed according to the departmental dose-optimization program which includes automated exposure control, adjustment of the mA and/or kV according to patient size and/or use of iterative reconstruction technique. COMPARISON:  CT cervical and thoracic Spine today reported separately. 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 Correlation with radiographs is recommended prior to any operative intervention. Alignment: Mildly exaggerated lumbar lordosis. No significant scoliosis or  spondylolisthesis. Vertebrae: Thoracic spine reported separately. Lumbar vertebrae appear intact. L3 vertebral body benign hemangioma (normal variant). Degenerative endplate changes L2-L3 through L5-S1. Visible sacrum and SI joints appear intact with degenerative vacuum SI joint phenomena. Paraspinal and other soft tissues: Costophrenic angle respiratory motion. Negative visible noncontrast abdominal viscera. Calcified iliac artery atherosclerosis. Lumbar paraspinal soft tissues are within normal limits. Disc levels: Mild for age spine degeneration T12-L1 and L1-L2. L2-L3: Disc space loss and vacuum disc. Circumferential disc bulge. Mild to moderate facet and ligament flavum hypertrophy. Vacuum facet on the right. Mild spinal stenosis suspected. Mild L2 foraminal stenosis. L3-L4: Similar vacuum disc. Moderate to severe facet and ligament flavum hypertrophy and vacuum facet on the right. Circumferential disc bulge contributing to mild to moderate spinal stenosis at this level. Mild to moderate left, mild right L3 foraminal stenosis. L4-L5: Less pronounced vacuum disc. Circumferential disc bulge. Moderate to severe facet and ligament flavum hypertrophy. Vacuum facet on the right. Mild to moderate spinal stenosis suspected. Severe bilateral L4 neural foraminal stenosis. L5-S1:  Sacralized and negative. IMPRESSION: 1. 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 Correlation with radiographs is recommended prior to any operative intervention. 2. No acute traumatic injury identified in the Lumbar Spine. 3. 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 Electronically Signed   By: Odessa Fleming M.D.   On: 02/07/2023 08:28   CT Head Wo Contrast  Result Date: 02/07/2023 CLINICAL DATA:  Provided history: Polytrauma, blunt. Fall. Back pain. Leg pain. EXAM: CT HEAD WITHOUT CONTRAST CT  CERVICAL SPINE WITHOUT CONTRAST TECHNIQUE: Multidetector CT imaging of the head and cervical spine was performed following the standard protocol without intravenous contrast. Multiplanar CT image reconstructions of the cervical spine were also generated. RADIATION DOSE REDUCTION: This exam was performed according to the departmental dose-optimization program which includes automated exposure control, adjustment of the mA and/or kV according to patient size and/or use of iterative reconstruction technique. COMPARISON:  None. FINDINGS: CT HEAD FINDINGS Brain: No age advanced or lobar predominant parenchymal atrophy. Mild-to-moderate patchy and ill-defined hypoattenuation within the cerebral white matter. There are a few small nonspecific calcifications scattered within the bilateral cerebral hemispheres. There is no acute intracranial hemorrhage. No demarcated cortical infarct. No extra-axial fluid collection. No evidence of an intracranial mass. No midline shift.  Vascular: No hyperdense vessel.  Atherosclerotic calcifications. Skull: No calvarial fracture or aggressive osseous lesion. Sinuses/Orbits: No mass or acute finding within the imaged orbits. No significant paranasal sinus disease at the imaged levels. CT CERVICAL SPINE FINDINGS Alignment: Straightening of the expected cervical lordosis. No significant spondylolisthesis. Skull base and vertebrae: The basion-dental and atlanto-dental intervals are maintained.No evidence of acute fracture to the cervical spine. Soft tissues and spinal canal: No prevertebral fluid or swelling. No visible canal hematoma. Disc levels: Cervical spondylosis with multilevel disc space narrowing, disc bulges/central disc protrusions, posterior disc osteophyte complexes and uncovertebral hypertrophy. Disc space narrowing is greatest at C4-C5 and C5-C6 (advanced at these levels). Multilevel spinal canal stenosis. Most notably at C5-C6, a posterior disc osteophyte complex contributes to  spinal canal stenosis which appears moderate in severity. Multilevel bony neural foraminal narrowing. Upper chest: No consolidation within the imaged lung apices. No visible pneumothorax. IMPRESSION: CT head: 1. No evidence of an acute intracranial abnormality. 2. Mild-to-moderate, nonspecific cerebral white matter disease. CT cervical spine: 1. No evidence of an acute cervical spine fracture. 2. Nonspecific straightening of the expected cervical lordosis. 3. Cervical spondylosis as described. Electronically Signed   By: Jackey Loge D.O.   On: 02/07/2023 08:17   CT Cervical Spine Wo Contrast  Result Date: 02/07/2023 CLINICAL DATA:  Provided history: Polytrauma, blunt. Fall. Back pain. Leg pain. EXAM: CT HEAD WITHOUT CONTRAST CT CERVICAL SPINE WITHOUT CONTRAST TECHNIQUE: Multidetector CT imaging of the head and cervical spine was performed following the standard protocol without intravenous contrast. Multiplanar CT image reconstructions of the cervical spine were also generated. RADIATION DOSE REDUCTION: This exam was performed according to the departmental dose-optimization program which includes automated exposure control, adjustment of the mA and/or kV according to patient size and/or use of iterative reconstruction technique. COMPARISON:  None. FINDINGS: CT HEAD FINDINGS Brain: No age advanced or lobar predominant parenchymal atrophy. Mild-to-moderate patchy and ill-defined hypoattenuation within the cerebral white matter. There are a few small nonspecific calcifications scattered within the bilateral cerebral hemispheres. There is no acute intracranial hemorrhage. No demarcated cortical infarct. No extra-axial fluid collection. No evidence of an intracranial mass. No midline shift. Vascular: No hyperdense vessel.  Atherosclerotic calcifications. Skull: No calvarial fracture or aggressive osseous lesion. Sinuses/Orbits: No mass or acute finding within the imaged orbits. No significant paranasal sinus disease  at the imaged levels. CT CERVICAL SPINE FINDINGS Alignment: Straightening of the expected cervical lordosis. No significant spondylolisthesis. Skull base and vertebrae: The basion-dental and atlanto-dental intervals are maintained.No evidence of acute fracture to the cervical spine. Soft tissues and spinal canal: No prevertebral fluid or swelling. No visible canal hematoma. Disc levels: Cervical spondylosis with multilevel disc space narrowing, disc bulges/central disc protrusions, posterior disc osteophyte complexes and uncovertebral hypertrophy. Disc space narrowing is greatest at C4-C5 and C5-C6 (advanced at these levels). Multilevel spinal canal stenosis. Most notably at C5-C6, a posterior disc osteophyte complex contributes to spinal canal stenosis which appears moderate in severity. Multilevel bony neural foraminal narrowing. Upper chest: No consolidation within the imaged lung apices. No visible pneumothorax. IMPRESSION: CT head: 1. No evidence of an acute intracranial abnormality. 2. Mild-to-moderate, nonspecific cerebral white matter disease. CT cervical spine: 1. No evidence of an acute cervical spine fracture. 2. Nonspecific straightening of the expected cervical lordosis. 3. Cervical spondylosis as described. Electronically Signed   By: Jackey Loge D.O.   On: 02/07/2023 08:17    EKG: Independently reviewed.  Sinus tachycardia, no STEMI.  Assessment and Plan  Acute  on chronic hypoxic hypercapnic respiratory failure OSA/OHS, not using BiPAP/CPAP at home Acute encephalopathy in the setting of hypercapnia Patient found with oxygen saturation down to the 60-70s when asleep in the ED and had difficulty waking up.  VBG was concerning for hypercapnia and she was placed on BiPAP.  Now more awake and alert, answering questions appropriately. CTA chest negative for PE or pulmonary edema.  Showing dilation of main pulmonary trunk suggestive of pulmonary hypertension.  Continue BiPAP at night and repeat  blood gas in a few hours.  Continuous pulse ox.  Continue supplemental oxygen during daytime as needed.  Arrangements need to be made to make sure patient has BiPAP available at home to be used at night and also she needs outpatient pulmonology follow-up.  Fall at home No acute traumatic injuries identified on CT scans of head/cervical/thoracic/lumbar spine/pelvis and chest.  Also no acute traumatic injuries identified on x-rays of right knee and right tibia/fibula.  Patient unable to go home due to difficulty ambulating and lack of support.  PT/OT and TOC consulted for placement.  Fall precautions.  Asthma Stable, no wheezing or respiratory distress.  Xopenex as needed due to mild tachycardia.  Chronic back and right knee pain Continue duloxetine, meloxicam, and tramadol after pharmacy med rec is done.  Tylenol as needed.  Hold gabapentin at this time and avoid opiates.  DVT prophylaxis: SCDs Code Status: Full Code (discussed with the patient) Level of care: Step Down Unit Admission status: It is my clinical opinion that referral for OBSERVATION is reasonable and necessary in this patient based on the above information provided. The aforementioned taken together are felt to place the patient at high risk for further clinical deterioration. However, it is anticipated that the patient may be medically stable for discharge from the hospital within 24 to 48 hours.  John Giovanni MD [facility]  If 7PM-7AM, please contact night-coverage [email or web address]  02/07/2023, 10:40 PM

## 2023-02-07 NOTE — TOC Progression Note (Signed)
Transition of Care [facility]) - Progression Note    Patient Details  Name: Janice Ryan MRN: [id number] Date of Birth: 1955/08/12  [facility] CM/SW Contact  Dannielle Karvonen Phone Number: 02/07/2023, 6:44 PM  Clinical Narrative:     CSW spoke with pt concerning SNF rec, pt agreeable to SNF workup, pt states she needs help and doesn't have consistent help at home. Pt states her son helps when he can but is in college. Pt states she is separated from her husband and he has his own medical issues. CSW explained SNF process, insurance auth and [place].gov ratings.   Expected Discharge Plan: Skilled Nursing Facility Barriers to Discharge: Other (must enter comment) (may need bariatric SNF bed)  Expected Discharge Plan and Services In-house Referral: Clinical Social Work     Living arrangements for the past 2 months: Apartment                                       Social Determinants of Health (SDOH) Interventions SDOH Screenings   Food Insecurity: No Food Insecurity (01/24/2022)  Housing: Low Risk  (01/24/2022)  Transportation Needs: No Transportation Needs (01/24/2022)  Utilities: Not At Risk (01/24/2022)  Depression (PHQ2-9): Low Risk  (07/06/2021)  Social Connections: Unknown (10/10/2022)   [facility], [facility]  Tobacco Use: Medium Risk (06/01/2022)   [facility]    Readmission Risk Interventions     No data to display

## 2023-02-07 NOTE — ED Triage Notes (Signed)
Pt BIB [facility] from home. Pt reports falling, denies LOC. No thinners. C/o chronic leg/back pain. Pt aox4.   142 systolic  95 HR 97% O2 RA

## 2023-02-07 NOTE — Evaluation (Signed)
Physical Therapy Evaluation Patient Details Name: Janice Ryan MRN: [id number] DOB: Aug 07, 1955 Today's Date: 02/07/2023  History of Present Illness  patient is a 67 year old female who presented 11/6 after a fall at home in bathtub. patient's imaging was negative for fracture but noted to have cervical, thoracic and lumbar spondylosis in imaging. patient was also noted to have fully sacralized L5. PMH: anxiety, chronic right knee pain, chronic hypercarbic respiratory failure.  Clinical Impression      Pt admitted with above diagnosis.  Pt currently with functional limitations due to the deficits listed below (see PT Problem List). Pt in bed when therapist arrived. Pt sleeping and roused however agreeable to therapy. Pt reported 10/10 worst pain ever on R LE. Pt stated she cannot use her R LE and that it is numb pt stated she was scheduled for OP orthopedic appointment this week to assess. Pt indicated she rolls on her "cart" rollator but sometimes is able to walk. Pt has assist from spouse and son at baseline for mobility and ADLs. Pt sustained 2 recent falls 11/2 and 11/6. Pt required max A for supine <> sit and +3 to reposition in bed with pt indicating R LE pain with movement. Pt left in bed and nursing staff present. Patient will benefit from continued inpatient follow up therapy, <3 hours/day. Pt will benefit from acute skilled PT to increase their independence and safety with mobility to allow discharge.       If plan is discharge home, recommend the following: Two people to help with walking and/or transfers;Two people to help with bathing/dressing/bathroom;Assistance with cooking/housework;Assist for transportation;Help with stairs or ramp for entrance   Can travel by private vehicle        Equipment Recommendations None recommended by PT (deferr to next venue)  Recommendations for Other Services       Functional Status Assessment Patient has had a recent decline in their  functional status and demonstrates the ability to make significant improvements in function in a reasonable and predictable amount of time.     Precautions / Restrictions Precautions Precautions: Fall Precaution Comments: monitor O2 on chronic O2 at home Restrictions Weight Bearing Restrictions: No      Mobility  Bed Mobility Overal bed mobility: Needs Assistance Bed Mobility: Supine to Sit, Sit to Supine     Supine to sit: +2 for physical assistance, +2 for safety/equipment, Max assist Sit to supine: +2 for safety/equipment, +2 for physical assistance, Total assist   General bed mobility comments: patient was +2 to progress to EOB and +3 to progress to supine in ED stretcher.    Transfers                   General transfer comment: NT for pt and staff safety    Ambulation/Gait               General Gait Details: NT for pt and staff safety with pt reporting minimal amb in home setting with her "cart"  Stairs            Wheelchair Mobility     Tilt Bed    Modified Rankin (Stroke Patients Only)       Balance Overall balance assessment: Needs assistance Sitting-balance support: Bilateral upper extremity supported Sitting balance-Leahy Scale: Fair Sitting balance - Comments: sitting on strecher in ED. kyphotic posture. Postural control: Right lateral lean (when in semi reclined in bed)  Pertinent Vitals/Pain Pain Assessment Pain Assessment: 0-10 Pain Score: 10-Worst pain ever Pain Location: all over, especially R knee and leg Pain Descriptors / Indicators: Constant Pain Intervention(s): Limited activity within patient's tolerance, Monitored during session    Home Living Family/patient expects to be discharged to:: Skilled nursing facility                   Additional Comments: patient reported having a rollator, wheelchair at home. patient reported having son and husband at home who  help her some.    Prior Function Prior Level of Function : Needs assist;Patient poor historian/Family not available               ADLs Comments: patient reported that she used rollator to get around at home and independent in toileting. patient reported getting in tub shower prior to this admission where she had a fall. reported son helps her some with hygiene tasks. prior hospitalization noted that patient needed physical A with transfers and hygiene tasks.     Extremity/Trunk Assessment   Upper Extremity Assessment Upper Extremity Assessment: Right hand dominant;RUE deficits/detail RUE Deficits / Details: tremor like movement in RUE reported it is from taking pain meds.    Lower Extremity Assessment Lower Extremity Assessment: RLE deficits/detail;LLE deficits/detail RLE Deficits / Details: ankle DF/PF 4/5, pt is grossly 3/5 for knee flexion, pt is unable to perform R knee extension against gravity 2/5, hip flexion 3-/5 hip abduction 3/5 and adduction 2/5 RLE Sensation: decreased light touch;decreased proprioception (pt reports her whole leg is numb and repeatedly requested PT squeeze her leg really hard, pt able to distinguish light touch and pressure accuratly R LE, pt indicated she could not feel her leg to move it and was able to assist R LE toward EOB with pain) LLE Deficits / Details: ankle DF/PF 4/5 and remainder grossly 3/5 LLE Sensation: WNL    Cervical / Trunk Assessment Cervical / Trunk Assessment: Other exceptions Cervical / Trunk Exceptions: body habitus  Communication   Communication Communication: No apparent difficulties (pt requires cues for attention and redirection)  Cognition Arousal: Alert Behavior During Therapy: WFL for tasks assessed/performed Overall Cognitive Status: Within Functional Limits for tasks assessed                                          General Comments      Exercises     Assessment/Plan    PT Assessment Patient  needs continued PT services  PT Problem List Decreased strength;Decreased range of motion;Decreased activity tolerance;Decreased mobility;Decreased balance;Decreased coordination;Obesity;Pain;Impaired sensation       PT Treatment Interventions DME instruction;Gait training;Functional mobility training;Therapeutic activities;Therapeutic exercise;Balance training;Neuromuscular re-education;Patient/family education    PT Goals (Current goals can be found in the Care Plan section)  Acute Rehab PT Goals Patient Stated Goal: to go home PT Goal Formulation: With patient Time For Goal Achievement: 02/21/23 Potential to Achieve Goals: Fair    Frequency Min 1X/week     Co-evaluation   Reason for Co-Treatment: For patient/therapist safety;To address functional/ADL transfers PT goals addressed during session: Mobility/safety with mobility OT goals addressed during session: ADL's and self-care       AM-PAC PT "6 Clicks" Mobility  Outcome Measure Help needed turning from your back to your side while in a flat bed without using bedrails?: Total Help needed moving from lying on your back to sitting on the  side of a flat bed without using bedrails?: Total Help needed moving to and from a bed to a chair (including a wheelchair)?: Total Help needed standing up from a chair using your arms (e.g., wheelchair or bedside chair)?: Total Help needed to walk in hospital room?: Total Help needed climbing 3-5 steps with a railing? : Total 6 Click Score: 6    End of Session Equipment Utilized During Treatment: Oxygen Activity Tolerance: Patient limited by fatigue;Patient limited by pain Patient left: in bed;with call bell/phone within reach;with nursing/sitter in room Nurse Communication: Mobility status;Need for lift equipment PT Visit Diagnosis: Unsteadiness on feet (R26.81);Other abnormalities of gait and mobility (R26.89);Repeated falls (R29.6);Muscle weakness (generalized) (M62.81);Difficulty in  walking, not elsewhere classified (R26.2);Pain Pain - Right/Left: Right Pain - part of body: Knee;Leg    Time: 8295-6213 PT Time Calculation (min) (ACUTE ONLY): 34 min   Charges:   PT Evaluation $PT Eval Moderate Complexity: 1 Mod PT Treatments $Therapeutic Activity: 8-22 mins PT General Charges $$ ACUTE PT VISIT: 1 Visit         Johnny Bridge, PT Acute Rehab   Jacqualyn Posey 02/07/2023, 4:02 PM

## 2023-02-07 NOTE — Telephone Encounter (Signed)
Requested medication (s) are due for refill today: routing for review  Requested medication (s) are on the active medication list: yes  Last refill: 09/26/19  Future visit scheduled: no  Notes to clinic:  Unable to refill per protocol due to failed labs, no updated results.      Requested Prescriptions  Pending Prescriptions Disp Refills   meloxicam (MOBIC) 7.5 MG tablet 30 tablet 4    Sig: Take 1 tablet (7.5 mg total) by mouth daily. As needed for pain; take after eating     Analgesics:  COX2 Inhibitors Failed - 02/06/2023  1:20 PM      Failed - Manual Review: Labs are only required if the patient has taken medication for more than 8 weeks.      Failed - HGB in normal range and within 360 days    Hemoglobin  Date Value Ref Range Status  01/29/2022 11.1 (L) 12.0 - 15.0 g/dL Final         Failed - Cr in normal range and within 360 days    Creatinine, Ser  Date Value Ref Range Status  01/29/2022 1.04 (H) 0.44 - 1.00 mg/dL Final         Failed - HCT in normal range and within 360 days    HCT  Date Value Ref Range Status  01/29/2022 35.6 (L) 36.0 - 46.0 % Final         Failed - AST in normal range and within 360 days    AST  Date Value Ref Range Status  01/25/2022 22 15 - 41 U/L Final         Failed - ALT in normal range and within 360 days    ALT  Date Value Ref Range Status  01/25/2022 17 0 - 44 U/L Final         Failed - eGFR is 30 or above and within 360 days    GFR calc Af Amer  Date Value Ref Range Status  08/08/2019 >60 >60 mL/min Final   GFR, Estimated  Date Value Ref Range Status  01/29/2022 >60 >60 mL/min Final    Comment:    (NOTE) Calculated using the CKD-EPI Creatinine Equation (2021)          Passed - Patient is not pregnant      Passed - Valid encounter within last 12 months    Recent Outpatient Visits           4 months ago Asthma, unspecified asthma severity, unspecified whether complicated, unspecified whether persistent   [facility], [place], NP   1 year ago Chronic frontal sinusitis   [facility], [place], [place]   1 year ago Chronic frontal sinusitis   [place] [facility], [name] J, NP   1 year ago Acute sinusitis, recurrence not specified, unspecified location   [facility], Kasandra Knudsen, PA-C   1 year ago Erroneous encounter - disregard   [facility], [place], [place]

## 2023-02-07 NOTE — ED Provider Notes (Incomplete Revision)
This patient was marking for TOC, PT, OT and placed as a boarder. Was supposed to ordered daily medications while in boarding status. Was alerted by nursing staff that the patient became hypoxic while sleeping and was even dropping down into the 60s.     I discussed this case with my attending physician who cosigned this note including patient's presenting symptoms, physical exam, and planned diagnostics and interventions. Attending physician stated agreement with plan or made changes to plan which were implemented.   Attending physician assessed patient at bedside.    Achille Rich, PA-C 02/07/23 2112

## 2023-02-07 NOTE — TOC Progression Note (Signed)
Transition of Care [facility]) - Progression Note    Patient Details  Name: Janice Ryan MRN: [id number] Date of Birth: [birth year]  [facility] CM/SW Contact  Carmina Miller, LCSWA Phone Number: 02/07/2023, 6:10 PM  Clinical Narrative:     CSW has attempted three times to reach pt via phone to discuss SNF rec, no answer. TOC will continue to follow.        Expected Discharge Plan and Services                                               Social Determinants of Health (SDOH) Interventions SDOH Screenings   Food Insecurity: No Food Insecurity (01/24/2022)  Housing: Low Risk  (01/24/2022)  Transportation Needs: No Transportation Needs (01/24/2022)  Utilities: Not At Risk (01/24/2022)  Depression (PHQ2-9): Low Risk  (07/06/2021)  Social Connections: Unknown (10/10/2022)   [facility], [facility]  Tobacco Use: Medium Risk (06/01/2022)   [facility]    Readmission Risk Interventions     No data to display

## 2023-02-07 NOTE — ED Notes (Signed)
Pt SpO2 fluctuates up and down from 93% to 70% when pt is asleep. Provider notified.

## 2023-02-07 NOTE — ED Provider Notes (Addendum)
This patient was marking for TOC, PT, OT and placed as a boarder. Was supposed to ordered daily medications while in boarding status. Was alerted by nursing staff that the patient became hypoxic while sleeping and was even dropping down into the 60s.   Labs and CT angio ordered.   The patient does appear to be mildly confused, but unknown if this is her baseline. She reports that she is supposed to be on 5L [place] at home "as needed" during the day and is supposed to be on 5L [place] at night. I asked if she used a CPAP or BiPAP or was supposed to be using one, and she answer no. Yet, when my attending asked, she reported that the parts were broken and she wasn't using it.   On previous chart evlaution, I am unable to see the note, but the patient has "oxygen dependant" listed as a description for multiple visits. She has been admitted in Oct 2023 for acute hypoxia requiring BiPAP. It appears she was supposed to be discharged on this as well.   I independently reviewed and interpreted the patient's labs.  CMP shows glucose at 120, creatinine 1.13 which appears around the baseline.  AST 12.  No other electrolyte or LFT abnormality.  VBG shows pH of 7.35 with a pCO2 of 70.  CBC shows slight anemia with a hemoglobin of 11.3, otherwise unremarkable.  BNP is elevated at 118.5.  Give the patient secondary lifestyle as well as new tachycardia with some hypoxia with sleeping, did order CT angio.  CT angio results 1. No acute intrathoracic pathology. No CT evidence of pulmonary embolism. 2. Dilatation of the main pulmonary trunk suggestive of pulmonary hypertension. 3. Cholelithiasis. 4.  Aortic Atherosclerosis. Per radiologist's interpretation.   Given her presentation, I do feel like she meets criteria for admission for further workup of the hypoxia as well as for the falls and generalized weakness.  Could probably benefit from SNF placement as looks like that was what the recommendation was last time she was admitted  for similar presentation.  BiPAP ordered. Dr. Loney Loh to admit.   I discussed this case with my attending physician who cosigned this note including patient's presenting symptoms, physical exam, and planned diagnostics and interventions. Attending physician stated agreement with plan or made changes to plan which were implemented.   Attending physician assessed patient at bedside.    Achille Rich, PA-C 02/07/23 2112  .Critical Care  Performed by: Achille Rich, PA-C Authorized by: Achille Rich, PA-C   Critical care provider statement:    Critical care time (minutes):  30   Critical care was necessary to treat or prevent imminent or life-threatening deterioration of the following conditions:  Respiratory failure   Critical care was time spent personally by me on the following activities:  Development of treatment plan with patient or surrogate, discussions with consultants, evaluation of patient's response to treatment, examination of patient, ordering and review of laboratory studies, ordering and review of radiographic studies, ordering and performing treatments and interventions, pulse oximetry, re-evaluation of patient's condition, review of old charts and obtaining history from patient or surrogate   Care discussed with: admitting provider   Comments:     BiPAP    Results for orders placed or performed during the hospital encounter of 02/07/23  Blood gas, venous  Result Value Ref Range   pH, Ven 7.35 7.25 - 7.43   pCO2, Ven 70 (H) 44 - 60 mmHg   pO2, Ven 64 (H) 32 -  45 mmHg   Bicarbonate 38.6 (H) 20.0 - 28.0 mmol/L   Acid-Base Excess 10.6 (H) 0.0 - 2.0 mmol/L   O2 Saturation 94.9 %   Patient temperature 37.0   CBC with Differential  Result Value Ref Range   WBC 10.2 4.0 - 10.5 K/uL   RBC 3.88 3.87 - 5.11 MIL/uL   Hemoglobin 11.3 (L) 12.0 - 15.0 g/dL   HCT 32.9 51.8 - 84.1 %   MCV 97.7 80.0 - 100.0 fL   MCH 29.1 26.0 - 34.0 pg   MCHC 29.8 (L) 30.0 - 36.0 g/dL   RDW  66.0 63.0 - 16.0 %   Platelets 259 150 - 400 K/uL   nRBC 0.0 0.0 - 0.2 %   Neutrophils Relative % 68 %   Neutro Abs 6.9 1.7 - 7.7 K/uL   Lymphocytes Relative 23 %   Lymphs Abs 2.4 0.7 - 4.0 K/uL   Monocytes Relative 8 %   Monocytes Absolute 0.8 0.1 - 1.0 K/uL   Eosinophils Relative 1 %   Eosinophils Absolute 0.1 0.0 - 0.5 K/uL   Basophils Relative 0 %   Basophils Absolute 0.0 0.0 - 0.1 K/uL   Immature Granulocytes 0 %   Abs Immature Granulocytes 0.04 0.00 - 0.07 K/uL  Comprehensive metabolic panel  Result Value Ref Range   Sodium 139 135 - 145 mmol/L   Potassium 4.1 3.5 - 5.1 mmol/L   Chloride 99 98 - 111 mmol/L   CO2 32 22 - 32 mmol/L   Glucose, Bld 120 (H) 70 - 99 mg/dL   BUN 17 8 - 23 mg/dL   Creatinine, Ser 1.09 (H) 0.44 - 1.00 mg/dL   Calcium 9.8 8.9 - 32.3 mg/dL   Total Protein 7.2 6.5 - 8.1 g/dL   Albumin 3.5 3.5 - 5.0 g/dL   AST 12 (L) 15 - 41 U/L   ALT 11 0 - 44 U/L   Alkaline Phosphatase 72 38 - 126 U/L   Total Bilirubin 0.7 <1.2 mg/dL   GFR, Estimated 54 (L) >60 mL/min   Anion gap 8 5 - 15  Brain natriuretic peptide  Result Value Ref Range   B Natriuretic Peptide 118.5 (H) 0.0 - 100.0 pg/mL   CT Angio Chest PE W and/or Wo Contrast  Result Date: 02/07/2023 CLINICAL DATA:  Concern for pulmonary embolism. EXAM: CT ANGIOGRAPHY CHEST WITH CONTRAST TECHNIQUE: Multidetector CT imaging of the chest was performed using the standard protocol during bolus administration of intravenous contrast. Multiplanar CT image reconstructions and MIPs were obtained to evaluate the vascular anatomy. RADIATION DOSE REDUCTION: This exam was performed according to the departmental dose-optimization program which includes automated exposure control, adjustment of the mA and/or kV according to patient size and/or use of iterative reconstruction technique. CONTRAST:  OMNIPAQUE IOHEXOL 350 MG/ML SOLN COMPARISON:  Chest CT dated 03/22/2019. FINDINGS: Cardiovascular: Top-normal cardiac size.  No pericardial effusion. There is lipomatous hypertrophy of the interatrial septum. Mild atherosclerotic calcification of the thoracic aorta. No aneurysmal dilatation or dissection. The origins of the great vessels of the aortic arch appear patent. There is dilatation of the main pulmonary trunk suggestive of pulmonary hypertension. No pulmonary artery embolus identified Mediastinum/Nodes: No hilar or mediastinal adenopathy. The esophagus is grossly unremarkable. Stable 2.5 cm rounded lesion in the anterior mediastinum, likely a benign process. No mediastinal fluid collection. Lungs/Pleura: Right lung base linear atelectasis/scarring. No focal consolidation, pleural effusion, or pneumothorax. The central airways are patent. Upper Abdomen: Noncalcified gallstones. Musculoskeletal: Osteopenia with degenerative changes.  No acute osseous pathology. Review of the MIP images confirms the above findings. IMPRESSION: 1. No acute intrathoracic pathology. No CT evidence of pulmonary embolism. 2. Dilatation of the main pulmonary trunk suggestive of pulmonary hypertension. 3. Cholelithiasis. 4.  Aortic Atherosclerosis (ICD10-I70.0). Electronically Signed   By: Elgie Collard M.D.   On: 02/07/2023 20:54   DG Chest Port 1 View  Result Date: 02/07/2023 CLINICAL DATA:  Shortness of breath EXAM: PORTABLE CHEST 1 VIEW COMPARISON:  01/23/2022 FINDINGS: Cardiomegaly. No frank interstitial edema. No pleural effusion or pneumothorax. IMPRESSION: Cardiomegaly. No frank interstitial edema. Electronically Signed   By: Charline Bills M.D.   On: 02/07/2023 19:19   DG Tibia/Fibula Right  Result Date: 02/07/2023 CLINICAL DATA:  Larey Seat when washing her hair.  Pain. EXAM: PORTABLE RIGHT KNEE - 1-2 VIEW; RIGHT TIBIA AND FIBULA - 2 VIEW COMPARISON:  Right knee radiographs 01/20/2022, right tibia and fibula radiographs 08/25/2021 FINDINGS: Right knee: There is diffuse decreased bone mineralization. Bone-on-bone contact of the medial  compartment. Large peripheral medial degenerative osteophytes. Moderate peripheral lateral compartment degenerative osteophytosis without joint space narrowing. Degenerative genu varum again noted. Severe patellofemoral joint space narrowing bone-on-bone contact and large peripheral osteophytes. There is an impaction of 14 mm ossicle within the suprapatellar joint space. Right tibia and fibula: There is a 6 mm chronic well corticated ossicle distal to the fibula, likely the sequela of remote trauma. Mild medial greater than lateral tibiotalar joint space narrowing. Mild distal anterior tibial plafond degenerative osteophytosis. Mild navicular-cuneiform joint space narrowing and dorsal osteophytosis on lateral view. No acute fracture or dislocation. IMPRESSION: 1. Severe medial and patellofemoral compartment osteoarthritis, similar to prior. 2. Mild tibiotalar and navicular-cuneiform osteoarthritis. 3. No acute fracture. Electronically Signed   By: Neita [name] M.D.   On: 02/07/2023 09:33   DG Knee Right Port  Result Date: 02/07/2023 CLINICAL DATA:  Larey Seat when washing her hair.  Pain. EXAM: PORTABLE RIGHT KNEE - 1-2 VIEW; RIGHT TIBIA AND FIBULA - 2 VIEW COMPARISON:  Right knee radiographs 01/20/2022, right tibia and fibula radiographs 08/25/2021 FINDINGS: Right knee: There is diffuse decreased bone mineralization. Bone-on-bone contact of the medial compartment. Large peripheral medial degenerative osteophytes. Moderate peripheral lateral compartment degenerative osteophytosis without joint space narrowing. Degenerative genu varum again noted. Severe patellofemoral joint space narrowing bone-on-bone contact and large peripheral osteophytes. There is an impaction of 14 mm ossicle within the suprapatellar joint space. Right tibia and fibula: There is a 6 mm chronic well corticated ossicle distal to the fibula, likely the sequela of remote trauma. Mild medial greater than lateral tibiotalar joint space narrowing.  Mild distal anterior tibial plafond degenerative osteophytosis. Mild navicular-cuneiform joint space narrowing and dorsal osteophytosis on lateral view. No acute fracture or dislocation. IMPRESSION: 1. Severe medial and patellofemoral compartment osteoarthritis, similar to prior. 2. Mild tibiotalar and navicular-cuneiform osteoarthritis. 3. No acute fracture. Electronically Signed   By: Neita [name] M.D.   On: 02/07/2023 09:33   CT PELVIS WO CONTRAST  Result Date: 02/07/2023 CLINICAL DATA:  fall EXAM: CT PELVIS WITHOUT CONTRAST TECHNIQUE: Multidetector CT imaging of the pelvis was performed following the standard protocol without intravenous contrast. RADIATION DOSE REDUCTION: This exam was performed according to the departmental dose-optimization program which includes automated exposure control, adjustment of the mA and/or kV according to patient size and/or use of iterative reconstruction technique. COMPARISON:  None Available. FINDINGS: Urinary Tract:  No abnormality visualized. Bowel:  Unremarkable visualized pelvic bowel loops. Vascular/Lymphatic: No pathologically enlarged lymph nodes. No significant vascular  abnormality seen. Reproductive:  Fibroid uterus. Musculoskeletal: No acute osseous abnormality. The bilateral femoral heads are seated in the acetabula. Degenerative changes of the bilateral hips with superolateral joint space narrowing, marginal osteophytosis and subchondral cystic changes. The sacroiliac joints and pubic symphysis are anatomically aligned with degenerative changes. Multilevel degenerative changes of the visualized lower lumbar spine. IMPRESSION: 1. No acute osseous abnormality. 2. Moderate osteoarthritis of the hips. Electronically Signed   By: Hart Robinsons M.D.   On: 02/07/2023 09:14   CT Thoracic Spine Wo Contrast  Result Date: 02/07/2023 CLINICAL DATA:  Provided history: Fall.  Mid back pain.  Leg pain. EXAM: CT THORACIC SPINE WITHOUT CONTRAST TECHNIQUE: Multidetector  CT images of the thoracic were obtained using the standard protocol without intravenous contrast. RADIATION DOSE REDUCTION: This exam was performed according to the departmental dose-optimization program which includes automated exposure control, adjustment of the mA and/or kV according to patient size and/or use of iterative reconstruction technique. COMPARISON:  Chest CT 03/22/2019. FINDINGS: Alignment: No significant spondylolisthesis. Vertebrae: Mild chronic anterior wedge deformity of the T7 vertebral body, unchanged from the prior chest CT of 03/22/2019. Vertebral body height is maintained at the remaining thoracic levels. No evidence of an acute fracture to the thoracic spine. Osseous fusion across the disc space at T8-T9. Early osseous fusion also suspected across the T9-T10 disc space. Ventrolateral osteophytes at T6-T7, T7-T8, T8-T9 and T9-T10. Hemangiomas within the T3 and T5 vertebrae. Paraspinal and other soft tissues: No acute finding within included portions of the thorax or upper abdomen/retroperitoneum. Aortic atherosclerosis. No paraspinal mass or collection. Disc levels: Osseous fusion across the disc space at T8-T9. Early osseous fusion also suspected across the T9-T10 disc space. Disc space narrowing elsewhere within the thoracic spine, greatest at T7-T8 (moderate-to-advanced at this level). No significant spinal canal stenosis is appreciated. Endplate spurring results in bilateral bony neural foraminal narrowing at T9-T10 (moderate right, mild left). IMPRESSION: 1. No evidence of an acute thoracic spine fracture. 2. Mild chronic anterior wedge deformity of the T7 vertebral body, unchanged from the prior chest CT of 03/22/2019. 3. Thoracic spondylosis as described. No significant spinal canal stenosis is appreciated. Endplate spurring results in bilateral bony neural foraminal narrowing at T9-T10 (moderate right, mild left). Osseous fusion across the T8-T9 disc space. Early osseous fusion also  suspected across the T9-T10 disc space. Electronically Signed   By: Jackey Loge D.O.   On: 02/07/2023 08:32   CT Lumbar Spine Wo Contrast  Result Date: 02/07/2023 CLINICAL DATA:  67 year old female status post fall with pain. EXAM: CT LUMBAR SPINE WITHOUT CONTRAST TECHNIQUE: Multidetector CT imaging of the lumbar spine was performed without intravenous contrast administration. Multiplanar CT image reconstructions were also generated. RADIATION DOSE REDUCTION: This exam was performed according to the departmental dose-optimization program which includes automated exposure control, adjustment of the mA and/or kV according to patient size and/or use of iterative reconstruction technique. COMPARISON:  CT cervical and thoracic Spine today reported separately. 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 Correlation with radiographs is recommended prior to any operative intervention. Alignment: Mildly exaggerated lumbar lordosis. No significant scoliosis or spondylolisthesis. Vertebrae: Thoracic spine reported separately. Lumbar vertebrae appear intact. L3 vertebral body benign hemangioma (normal variant). Degenerative endplate changes L2-L3 through L5-S1. Visible sacrum and SI joints appear intact with degenerative vacuum SI joint phenomena. Paraspinal and other soft tissues: Costophrenic angle respiratory motion. Negative visible noncontrast abdominal viscera. Calcified iliac artery atherosclerosis. Lumbar paraspinal soft  tissues are within normal limits. Disc levels: Mild for age spine degeneration T12-L1 and L1-L2. L2-L3: Disc space loss and vacuum disc. Circumferential disc bulge. Mild to moderate facet and ligament flavum hypertrophy. Vacuum facet on the right. Mild spinal stenosis suspected. Mild L2 foraminal stenosis. L3-L4: Similar vacuum disc. Moderate to severe facet and ligament flavum hypertrophy and vacuum facet on the right.  Circumferential disc bulge contributing to mild to moderate spinal stenosis at this level. Mild to moderate left, mild right L3 foraminal stenosis. L4-L5: Less pronounced vacuum disc. Circumferential disc bulge. Moderate to severe facet and ligament flavum hypertrophy. Vacuum facet on the right. Mild to moderate spinal stenosis suspected. Severe bilateral L4 neural foraminal stenosis. L5-S1:  Sacralized and negative. IMPRESSION: 1. 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 Correlation with radiographs is recommended prior to any operative intervention. 2. No acute traumatic injury identified in the Lumbar Spine. 3. 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 Electronically Signed   By: Odessa Fleming M.D.   On: 02/07/2023 08:28   CT Head Wo Contrast  Result Date: 02/07/2023 CLINICAL DATA:  Provided history: Polytrauma, blunt. Fall. Back pain. Leg pain. EXAM: CT HEAD WITHOUT CONTRAST CT CERVICAL SPINE WITHOUT CONTRAST TECHNIQUE: Multidetector CT imaging of the head and cervical spine was performed following the standard protocol without intravenous contrast. Multiplanar CT image reconstructions of the cervical spine were also generated. RADIATION DOSE REDUCTION: This exam was performed according to the departmental dose-optimization program which includes automated exposure control, adjustment of the mA and/or kV according to patient size and/or use of iterative reconstruction technique. COMPARISON:  None. FINDINGS: CT HEAD FINDINGS Brain: No age advanced or lobar predominant parenchymal atrophy. Mild-to-moderate patchy and ill-defined hypoattenuation within the cerebral white matter. There are a few small nonspecific calcifications scattered within the bilateral cerebral hemispheres. There is no acute intracranial hemorrhage. No demarcated cortical infarct. No  extra-axial fluid collection. No evidence of an intracranial mass. No midline shift. Vascular: No hyperdense vessel.  Atherosclerotic calcifications. Skull: No calvarial fracture or aggressive osseous lesion. Sinuses/Orbits: No mass or acute finding within the imaged orbits. No significant paranasal sinus disease at the imaged levels. CT CERVICAL SPINE FINDINGS Alignment: Straightening of the expected cervical lordosis. No significant spondylolisthesis. Skull base and vertebrae: The basion-dental and atlanto-dental intervals are maintained.No evidence of acute fracture to the cervical spine. Soft tissues and spinal canal: No prevertebral fluid or swelling. No visible canal hematoma. Disc levels: Cervical spondylosis with multilevel disc space narrowing, disc bulges/central disc protrusions, posterior disc osteophyte complexes and uncovertebral hypertrophy. Disc space narrowing is greatest at C4-C5 and C5-C6 (advanced at these levels). Multilevel spinal canal stenosis. Most notably at C5-C6, a posterior disc osteophyte complex contributes to spinal canal stenosis which appears moderate in severity. Multilevel bony neural foraminal narrowing. Upper chest: No consolidation within the imaged lung apices. No visible pneumothorax. IMPRESSION: CT head: 1. No evidence of an acute intracranial abnormality. 2. Mild-to-moderate, nonspecific cerebral white matter disease. CT cervical spine: 1. No evidence of an acute cervical spine fracture. 2. Nonspecific straightening of the expected cervical lordosis. 3. Cervical spondylosis as described. Electronically Signed   By: Jackey Loge D.O.   On: 02/07/2023 08:17   CT Cervical Spine Wo Contrast  Result Date: 02/07/2023 CLINICAL DATA:  Provided history: Polytrauma, blunt. Fall. Back pain. Leg pain. EXAM: CT HEAD WITHOUT CONTRAST CT CERVICAL SPINE WITHOUT CONTRAST TECHNIQUE:  Multidetector CT imaging of the head and cervical spine was performed following the standard protocol  without intravenous contrast. Multiplanar CT image reconstructions of the cervical spine were also generated. RADIATION DOSE REDUCTION: This exam was performed according to the departmental dose-optimization program which includes automated exposure control, adjustment of the mA and/or kV according to patient size and/or use of iterative reconstruction technique. COMPARISON:  None. FINDINGS: CT HEAD FINDINGS Brain: No age advanced or lobar predominant parenchymal atrophy. Mild-to-moderate patchy and ill-defined hypoattenuation within the cerebral white matter. There are a few small nonspecific calcifications scattered within the bilateral cerebral hemispheres. There is no acute intracranial hemorrhage. No demarcated cortical infarct. No extra-axial fluid collection. No evidence of an intracranial mass. No midline shift. Vascular: No hyperdense vessel.  Atherosclerotic calcifications. Skull: No calvarial fracture or aggressive osseous lesion. Sinuses/Orbits: No mass or acute finding within the imaged orbits. No significant paranasal sinus disease at the imaged levels. CT CERVICAL SPINE FINDINGS Alignment: Straightening of the expected cervical lordosis. No significant spondylolisthesis. Skull base and vertebrae: The basion-dental and atlanto-dental intervals are maintained.No evidence of acute fracture to the cervical spine. Soft tissues and spinal canal: No prevertebral fluid or swelling. No visible canal hematoma. Disc levels: Cervical spondylosis with multilevel disc space narrowing, disc bulges/central disc protrusions, posterior disc osteophyte complexes and uncovertebral hypertrophy. Disc space narrowing is greatest at C4-C5 and C5-C6 (advanced at these levels). Multilevel spinal canal stenosis. Most notably at C5-C6, a posterior disc osteophyte complex contributes to spinal canal stenosis which appears moderate in severity. Multilevel bony neural foraminal narrowing. Upper chest: No consolidation within the  imaged lung apices. No visible pneumothorax. IMPRESSION: CT head: 1. No evidence of an acute intracranial abnormality. 2. Mild-to-moderate, nonspecific cerebral white matter disease. CT cervical spine: 1. No evidence of an acute cervical spine fracture. 2. Nonspecific straightening of the expected cervical lordosis. 3. Cervical spondylosis as described. Electronically Signed   By: Jackey Loge D.O.   On: 02/07/2023 08:17      Achille Rich, PA-C 02/08/23 0152    Achille Rich, PA-C 02/08/23 1308    Wynetta Fines, MD 02/13/23 [phone]

## 2023-02-07 NOTE — Progress Notes (Signed)
Awaiting PT.  

## 2023-02-07 NOTE — Telephone Encounter (Signed)
  Chief Complaint: Medications Symptoms: NA Frequency: NA Pertinent Negatives: Patient denies NA Disposition: [] ED /[] Urgent Care (no appt availability in office) / [] Appointment(In office/virtual)/ []  [facility]/ [] Home Care/ [] Refused Recommended Disposition /[] [facility]/ []  Follow-up with PCP Additional Notes:   Pt states she has been in ED for 2 days S/P fall. Calling to request Tramadol and her inhaler be called into the ED "So I can have my meds."  During call ED staff member entered pts room and told pt they could take care of that. Pt states "OK I'll get off the phone now." Pt hung up.

## 2023-02-07 NOTE — ED Notes (Signed)
Ice water given to pt x2, repositioned for comfort.  Pt reports soreness on right labia, applied barrier cream, pt expressed relief

## 2023-02-07 NOTE — Evaluation (Signed)
Occupational Therapy Evaluation Patient Details Name: Janice Ryan MRN: [id number] DOB: [birth year] Today's Date: 02/07/2023   History of Present Illness patient is a 67 year old female who presented 11/6 after a fall at home in bathtub. patient's imaging was negative for fracture but noted to have cervical, thoracic and lumbar spondylosis in imaging. patient was also noted to have fully sacralized L5. PMH: anxiety, chronic right knee pain, chronic hypercarbic respiratory failure.   Clinical Impression   Patient is a 67 year old female who was admitted for above. Patient  was living at home with help from her son. Patient reported living at rollator level but unclear if patient sits to use rollator or stands. Patient was +2 for bed mobility and +3 to return to bed with increased pain and calling out with movement. Patient reported that she does not have 24/7 caregiver support at home. Patient will benefit from continued inpatient follow up therapy, <3 hours/day       If plan is discharge home, recommend the following: Two people to help with walking and/or transfers;Assistance with cooking/housework;Two people to help with bathing/dressing/bathroom;Direct supervision/assist for medications management;Assist for transportation;Help with stairs or ramp for entrance;Direct supervision/assist for financial management    Functional Status Assessment  Patient has had a recent decline in their functional status and demonstrates the ability to make significant improvements in function in a reasonable and predictable amount of time.  Equipment Recommendations  None recommended by OT       Precautions / Restrictions Precautions Precautions: Fall Precaution Comments: monitor O2 on chronic O2 at home Restrictions Weight Bearing Restrictions: No      Mobility Bed Mobility Overal bed mobility: Needs Assistance Bed Mobility: Supine to Sit, Sit to Supine     Supine to sit: +2 for physical  assistance, +2 for safety/equipment, Max assist Sit to supine: +2 for safety/equipment, +2 for physical assistance, Total assist   General bed mobility comments: patient was +2 to progress to EOB and +3 to progress to supine in ED stretcher.        Balance Overall balance assessment: Needs assistance Sitting-balance support: Bilateral upper extremity supported Sitting balance-Leahy Scale: Fair Sitting balance - Comments: sitting on strecher in ED. kyphotic posture.         ADL either performed or assessed with clinical judgement   ADL Overall ADL's : Needs assistance/impaired Eating/Feeding: Set up;Bed level   Grooming: Bed level;Wash/dry face;Set up Grooming Details (indicate cue type and reason): with increased time.     Lower Body Bathing: Bed level;Maximal assistance   Upper Body Dressing : Bed level;Total assistance   Lower Body Dressing: Bed level;Maximal assistance     Toilet Transfer Details (indicate cue type and reason): Patient was +2 TD to progress to EOB. patient was +4 for getting repositioned back in the ED stretcher with use of air matress positioning pad. Toileting- Clothing Manipulation and Hygiene: Total assistance;Bed level               Vision   Additional Comments: prefers to keep eyes closed during session. when asked if light was bothering her she started speaking about eye drops for "mites" in her eyes that pharmacist gave her.            Pertinent Vitals/Pain Pain Assessment Pain Assessment: 0-10 Pain Score: 10-Worst pain ever Pain Location: all over Pain Descriptors / Indicators: Constant Pain Intervention(s): Limited activity within patient's tolerance, Monitored during session, Premedicated before session, Repositioned     Extremity/Trunk Assessment  Upper Extremity Assessment Upper Extremity Assessment: Right hand dominant;RUE deficits/detail RUE Deficits / Details: tremor like movement in RUE reported it is from taking pain  meds.   Lower Extremity Assessment Lower Extremity Assessment: Defer to PT evaluation (RLE pain in leg)   Cervical / Trunk Assessment Cervical / Trunk Assessment: Other exceptions Cervical / Trunk Exceptions: body habitus   Communication     Cognition Arousal: Alert Behavior During Therapy: WFL for tasks assessed/performed Overall Cognitive Status: Within Functional Limits for tasks assessed                    Home Living Family/patient expects to be discharged to:: Skilled nursing facility           Additional Comments: patient reported having a rollator, wheelchair at home. patient reported having son and husband at home who help her some.      Prior Functioning/Environment Prior Level of Function : Needs assist;Patient poor historian/Family not available               ADLs Comments: patient reported that she used rollator to get around at home and independent in toileting. patient reported getting in tub shower prior to this admission where she had a fall. reported son helps her some with hygiene tasks. prior hospitalization noted that patient needed physical A with transfers and hygiene tasks.        OT Problem List: Decreased activity tolerance;Impaired balance (sitting and/or standing);Decreased coordination;Decreased safety awareness;Decreased knowledge of precautions;Decreased knowledge of use of DME or AE;Pain;Obesity      OT Treatment/Interventions: Self-care/ADL training;Therapeutic exercise;DME and/or AE instruction;Therapeutic activities;Patient/family education;Balance training    OT Goals(Current goals can be found in the care plan section) Acute Rehab OT Goals Patient Stated Goal: to get pain under control OT Goal Formulation: With patient Time For Goal Achievement: 02/21/23 Potential to Achieve Goals: Fair  OT Frequency: Min 1X/week    Co-evaluation PT/OT/SLP Co-Evaluation/Treatment: Yes Reason for Co-Treatment: For patient/therapist  safety;To address functional/ADL transfers PT goals addressed during session: Mobility/safety with mobility OT goals addressed during session: ADL's and self-care      AM-PAC OT "6 Clicks" Daily Activity     Outcome Measure Help from another person eating meals?: A Little Help from another person taking care of personal grooming?: Total Help from another person toileting, which includes using toliet, bedpan, or urinal?: Total Help from another person bathing (including washing, rinsing, drying)?: Total Help from another person to put on and taking off regular upper body clothing?: A Lot Help from another person to put on and taking off regular lower body clothing?: Total 6 Click Score: 9   End of Session Nurse Communication: Mobility status  Activity Tolerance: Patient limited by pain Patient left: in bed;with call bell/phone within reach;with nursing/sitter in room (nurse in room)  OT Visit Diagnosis: Unsteadiness on feet (R26.81);Other abnormalities of gait and mobility (R26.89);Muscle weakness (generalized) (M62.81);Pain                Time: 7829-5621 OT Time Calculation (min): 34 min Charges:  OT General Charges $OT Visit: 1 Visit OT Evaluation $OT Eval Moderate Complexity: 1 Mod  Mackay Hanauer OTR/L, MS Acute Rehabilitation Department Office# [phone]   Selinda Flavin 02/07/2023, 3:45 PM

## 2023-02-08 ENCOUNTER — Encounter (HOSPITAL_COMMUNITY): Payer: Self-pay | Admitting: Internal Medicine

## 2023-02-08 DIAGNOSIS — M549 Dorsalgia, unspecified: Secondary | ICD-10-CM | POA: Diagnosis present

## 2023-02-08 DIAGNOSIS — J45909 Unspecified asthma, uncomplicated: Secondary | ICD-10-CM | POA: Diagnosis present

## 2023-02-08 DIAGNOSIS — Z7951 Long term (current) use of inhaled steroids: Secondary | ICD-10-CM | POA: Diagnosis not present

## 2023-02-08 DIAGNOSIS — M1711 Unilateral primary osteoarthritis, right knee: Secondary | ICD-10-CM | POA: Diagnosis present

## 2023-02-08 DIAGNOSIS — Z6841 Body Mass Index (BMI) 40.0 and over, adult: Secondary | ICD-10-CM | POA: Diagnosis not present

## 2023-02-08 DIAGNOSIS — Z9981 Dependence on supplemental oxygen: Secondary | ICD-10-CM | POA: Diagnosis not present

## 2023-02-08 DIAGNOSIS — M1611 Unilateral primary osteoarthritis, right hip: Secondary | ICD-10-CM | POA: Diagnosis present

## 2023-02-08 DIAGNOSIS — N179 Acute kidney failure, unspecified: Secondary | ICD-10-CM | POA: Diagnosis not present

## 2023-02-08 DIAGNOSIS — G9341 Metabolic encephalopathy: Secondary | ICD-10-CM | POA: Diagnosis present

## 2023-02-08 DIAGNOSIS — J9622 Acute and chronic respiratory failure with hypercapnia: Secondary | ICD-10-CM | POA: Diagnosis present

## 2023-02-08 DIAGNOSIS — Y92019 Unspecified place in single-family (private) house as the place of occurrence of the external cause: Secondary | ICD-10-CM | POA: Diagnosis not present

## 2023-02-08 DIAGNOSIS — R410 Disorientation, unspecified: Secondary | ICD-10-CM | POA: Diagnosis present

## 2023-02-08 DIAGNOSIS — E662 Morbid (severe) obesity with alveolar hypoventilation: Secondary | ICD-10-CM | POA: Diagnosis present

## 2023-02-08 DIAGNOSIS — F32A Depression, unspecified: Secondary | ICD-10-CM | POA: Diagnosis present

## 2023-02-08 DIAGNOSIS — Z993 Dependence on wheelchair: Secondary | ICD-10-CM | POA: Diagnosis not present

## 2023-02-08 DIAGNOSIS — J4489 Other specified chronic obstructive pulmonary disease: Secondary | ICD-10-CM | POA: Diagnosis present

## 2023-02-08 DIAGNOSIS — Z23 Encounter for immunization: Secondary | ICD-10-CM | POA: Diagnosis present

## 2023-02-08 DIAGNOSIS — W010XXA Fall on same level from slipping, tripping and stumbling without subsequent striking against object, initial encounter: Secondary | ICD-10-CM | POA: Diagnosis present

## 2023-02-08 DIAGNOSIS — Z79899 Other long term (current) drug therapy: Secondary | ICD-10-CM | POA: Diagnosis not present

## 2023-02-08 DIAGNOSIS — J9621 Acute and chronic respiratory failure with hypoxia: Secondary | ICD-10-CM | POA: Diagnosis present

## 2023-02-08 DIAGNOSIS — I10 Essential (primary) hypertension: Secondary | ICD-10-CM | POA: Diagnosis present

## 2023-02-08 DIAGNOSIS — Z8249 Family history of ischemic heart disease and other diseases of the circulatory system: Secondary | ICD-10-CM | POA: Diagnosis not present

## 2023-02-08 DIAGNOSIS — Z96652 Presence of left artificial knee joint: Secondary | ICD-10-CM | POA: Diagnosis present

## 2023-02-08 DIAGNOSIS — G8929 Other chronic pain: Secondary | ICD-10-CM | POA: Diagnosis present

## 2023-02-08 DIAGNOSIS — Z888 Allergy status to other drugs, medicaments and biological substances status: Secondary | ICD-10-CM | POA: Diagnosis not present

## 2023-02-08 DIAGNOSIS — Z882 Allergy status to sulfonamides status: Secondary | ICD-10-CM | POA: Diagnosis not present

## 2023-02-08 LAB — BLOOD GAS, VENOUS
Acid-Base Excess: 6.7 mmol/L — ABNORMAL HIGH (ref 0.0–2.0)
Bicarbonate: 33.3 mmol/L — ABNORMAL HIGH (ref 20.0–28.0)
O2 Saturation: 95.2 %
Patient temperature: 37
pCO2, Ven: 55 mm[Hg] (ref 44–60)
pH, Ven: 7.39 (ref 7.25–7.43)
pO2, Ven: 65 mm[Hg] — ABNORMAL HIGH (ref 32–45)

## 2023-02-08 LAB — HIV ANTIBODY (ROUTINE TESTING W REFLEX): HIV Screen 4th Generation wRfx: NONREACTIVE

## 2023-02-08 LAB — MRSA NEXT GEN BY PCR, NASAL: MRSA by PCR Next Gen: NOT DETECTED

## 2023-02-08 MED ORDER — GABAPENTIN 300 MG PO CAPS: 300.0000 mg | ORAL_CAPSULE | Freq: Every day | ORAL | Status: DC

## 2023-02-08 MED ORDER — ORAL CARE MOUTH RINSE: 15.0000 mL | OROMUCOSAL | Status: DC | PRN

## 2023-02-08 MED ORDER — TRAMADOL HCL 50 MG PO TABS
50.0000 mg | ORAL_TABLET | Freq: Two times a day (BID) | ORAL | Status: DC | PRN
  Administered 2023-02-08 – 2023-02-16 (×10): 50 mg via ORAL
  Filled 2023-02-08 (×10): qty 1

## 2023-02-08 MED ORDER — TRAMADOL HCL 50 MG PO TABS: 50.0000 mg | ORAL_TABLET | Freq: Four times a day (QID) | ORAL | Status: DC | PRN

## 2023-02-08 MED ORDER — GABAPENTIN 100 MG PO CAPS
100.0000 mg | ORAL_CAPSULE | Freq: Every day | ORAL | Status: DC
  Administered 2023-02-08: 100 mg via ORAL
  Filled 2023-02-08: qty 1

## 2023-02-08 MED ORDER — ACETAMINOPHEN 500 MG PO TABS
1000.0000 mg | ORAL_TABLET | Freq: Every day | ORAL | Status: DC | PRN
  Administered 2023-02-08 – 2023-02-09 (×2): 1000 mg via ORAL
  Filled 2023-02-08 (×2): qty 2

## 2023-02-08 MED ORDER — BUSPIRONE HCL 5 MG PO TABS
5.0000 mg | ORAL_TABLET | Freq: Three times a day (TID) | ORAL | Status: DC
  Administered 2023-02-08 – 2023-02-16 (×25): 5 mg via ORAL
  Filled 2023-02-08 (×25): qty 1

## 2023-02-08 MED ORDER — DULOXETINE HCL 60 MG PO CPEP
120.0000 mg | ORAL_CAPSULE | Freq: Every day | ORAL | Status: DC
  Administered 2023-02-08 – 2023-02-16 (×9): 120 mg via ORAL
  Filled 2023-02-08 (×5): qty 2
  Filled 2023-02-08: qty 4
  Filled 2023-02-08 (×2): qty 2
  Filled 2023-02-08: qty 4

## 2023-02-08 MED ORDER — ORAL CARE MOUTH RINSE
15.0000 mL | OROMUCOSAL | Status: DC
  Administered 2023-02-08 – 2023-02-16 (×24): 15 mL via OROMUCOSAL

## 2023-02-08 MED ORDER — DICLOFENAC SODIUM 1 % EX GEL
2.0000 g | Freq: Four times a day (QID) | CUTANEOUS | Status: DC | PRN
  Administered 2023-02-08 – 2023-02-09 (×4): 2 g via TOPICAL
  Filled 2023-02-08: qty 100

## 2023-02-08 MED ORDER — CHLORHEXIDINE GLUCONATE CLOTH 2 % EX PADS
6.0000 | MEDICATED_PAD | Freq: Every day | CUTANEOUS | Status: DC
  Administered 2023-02-08: 6 via TOPICAL

## 2023-02-08 NOTE — ED Notes (Signed)
Pt repositioned using air mattress and placing pillows underneath right hip.

## 2023-02-08 NOTE — ED Notes (Signed)
ED TO INPATIENT HANDOFF REPORT  Name/Age/Gender Janice Ryan 67 y.o. female  Code Status    Code Status Orders  (From admission, onward)           Start     Ordered   02/07/23 2341  Full code  Continuous       Question:  By:  Answer:  Consent: discussion documented in EHR   02/07/23 2341           Code Status History     Date Active Date Inactive Code Status Order ID Comments User Context   01/21/2022 0038 01/30/2022 0006 Full Code [id number]  Evelena Leyden, DO ED   03/20/2019 2008 04/03/2019 0417 Full Code [id number]  Charlsie Quest, MD ED   07/28/2018 0117 07/31/2018 1900 Full Code [id number]  Eulah Pont, MD ED       Home/SNF/Other Skilled nursing facility  Chief Complaint Acute on chronic respiratory failure with hypoxia and hypercapnia (HCC) [G29.52, J96.22]  Level of Care/Admitting Diagnosis ED Disposition     ED Disposition  Admit   Condition  --   Comment  Hospital Area: [facility] [[id number]]  Level of Care: Stepdown [14]  Admit to SDU based on following criteria: Respiratory Distress:  Frequent assessment and/or intervention to maintain adequate ventilation/respiration, pulmonary toilet, and respiratory treatment.  May place patient in observation at [facility] or [facility] if equivalent level of care is available:: Yes  Covid Evaluation: Asymptomatic - no recent exposure (last 10 days) testing not required  Diagnosis: Acute on chronic respiratory failure with hypoxia and hypercapnia [facility]) [[id number]]  Admitting Physician: John Giovanni [[id number]]  Attending Physician: John Giovanni [[id number]]          Medical History Past Medical History:  Diagnosis Date   Acute respiratory failure with hypoxia (HCC)    Asthma    Hypertension    Morbid obesity (HCC)    Obesity with alveolar hypoventilation and body mass index (BMI) of 40 or greater (HCC)    OSA (obstructive sleep apnea)     Allergies Allergies   Allergen Reactions   Neurontin [Gabapentin] Other (See Comments)    Has un-controlled movement of her body, excessive dribble of saliva at night   Sulfa Antibiotics Nausea And Vomiting and Other (See Comments)    Stomach pain    IV Location/Drains/Wounds Patient Lines/Drains/Airways Status     Active Line/Drains/Airways     Name Placement date Placement time Site Days   External Urinary Catheter 08/25/21  0501  --  532   Wound / Incision (Open or Dehisced) 01/23/22 Irritant Dermatitis (Moisture Associated Skin Damage) Other (Comment) Right;Upper pink, moist, odor 01/23/22  0400  Other (Comment)  381            Labs/Imaging Results for orders placed or performed during the hospital encounter of 02/07/23 (from the past 48 hour(s))  Blood gas, venous     Status: Abnormal   Collection Time: 02/07/23  5:25 PM  Result Value Ref Range   pH, Ven 7.35 7.25 - 7.43   pCO2, Ven 70 (H) 44 - 60 mmHg   pO2, Ven 64 (H) 32 - 45 mmHg   Bicarbonate 38.6 (H) 20.0 - 28.0 mmol/L   Acid-Base Excess 10.6 (H) 0.0 - 2.0 mmol/L   O2 Saturation 94.9 %   Patient temperature 37.0     Comment: Performed at [facility], [address] [place], [place] [address]  CBC with Differential  Status: Abnormal   Collection Time: 02/07/23  5:25 PM  Result Value Ref Range   WBC 10.2 4.0 - 10.5 K/uL   RBC 3.88 3.87 - 5.11 MIL/uL   Hemoglobin 11.3 (L) 12.0 - 15.0 g/dL   HCT 66.4 40.3 - 47.4 %   MCV 97.7 80.0 - 100.0 fL   MCH 29.1 26.0 - 34.0 pg   MCHC 29.8 (L) 30.0 - 36.0 g/dL   RDW 25.9 56.3 - 87.5 %   Platelets 259 150 - 400 K/uL   nRBC 0.0 0.0 - 0.2 %   Neutrophils Relative % 68 %   Neutro Abs 6.9 1.7 - 7.7 K/uL   Lymphocytes Relative 23 %   Lymphs Abs 2.4 0.7 - 4.0 K/uL   Monocytes Relative 8 %   Monocytes Absolute 0.8 0.1 - 1.0 K/uL   Eosinophils Relative 1 %   Eosinophils Absolute 0.1 0.0 - 0.5 K/uL   Basophils Relative 0 %   Basophils Absolute 0.0 0.0 - 0.1 K/uL    Immature Granulocytes 0 %   Abs Immature Granulocytes 0.04 0.00 - 0.07 K/uL    Comment: Performed at [facility], [address] [place], [place] [address]  Comprehensive metabolic panel     Status: Abnormal   Collection Time: 02/07/23  5:25 PM  Result Value Ref Range   Sodium 139 135 - 145 mmol/L   Potassium 4.1 3.5 - 5.1 mmol/L   Chloride 99 98 - 111 mmol/L   CO2 32 22 - 32 mmol/L   Glucose, Bld 120 (H) 70 - 99 mg/dL    Comment: Glucose reference range applies only to samples taken after fasting for at least 8 hours.   BUN 17 8 - 23 mg/dL   Creatinine, Ser 9.51 (H) 0.44 - 1.00 mg/dL   Calcium 9.8 8.9 - 88.4 mg/dL   Total Protein 7.2 6.5 - 8.1 g/dL   Albumin 3.5 3.5 - 5.0 g/dL   AST 12 (L) 15 - 41 U/L   ALT 11 0 - 44 U/L   Alkaline Phosphatase 72 38 - 126 U/L   Total Bilirubin 0.7 <1.2 mg/dL   GFR, Estimated 54 (L) >60 mL/min    Comment: (NOTE) Calculated using the CKD-EPI Creatinine Equation (2021)    Anion gap 8 5 - 15    Comment: Performed at [facility], [address] [place], [place] [address]  Brain natriuretic peptide     Status: Abnormal   Collection Time: 02/07/23  5:25 PM  Result Value Ref Range   B Natriuretic Peptide 118.5 (H) 0.0 - 100.0 pg/mL    Comment: Performed at [facility], [address], [place], [place] [address]  Blood gas, venous     Status: Abnormal   Collection Time: 02/08/23  6:12 AM  Result Value Ref Range   pH, Ven 7.39 7.25 - 7.43   pCO2, Ven 55 44 - 60 mmHg   pO2, Ven 65 (H) 32 - 45 mmHg   Bicarbonate 33.3 (H) 20.0 - 28.0 mmol/L   Acid-Base Excess 6.7 (H) 0.0 - 2.0 mmol/L   O2 Saturation 95.2 %   Patient temperature 37.0     Comment: Performed at [facility], [address] [place], [place] [address]   CT Angio Chest PE W and/or Wo Contrast  Result Date: 02/07/2023 CLINICAL DATA:  Concern for pulmonary embolism. EXAM: CT ANGIOGRAPHY CHEST WITH CONTRAST  TECHNIQUE: Multidetector CT imaging of the chest was performed using  the standard protocol during bolus administration of intravenous contrast. Multiplanar CT image reconstructions and MIPs were obtained to evaluate the vascular anatomy. RADIATION DOSE REDUCTION: This exam was performed according to the departmental dose-optimization program which includes automated exposure control, adjustment of the mA and/or kV according to patient size and/or use of iterative reconstruction technique. CONTRAST:  OMNIPAQUE IOHEXOL 350 MG/ML SOLN COMPARISON:  Chest CT dated 03/22/2019. FINDINGS: Cardiovascular: Top-normal cardiac size. No pericardial effusion. There is lipomatous hypertrophy of the interatrial septum. Mild atherosclerotic calcification of the thoracic aorta. No aneurysmal dilatation or dissection. The origins of the great vessels of the aortic arch appear patent. There is dilatation of the main pulmonary trunk suggestive of pulmonary hypertension. No pulmonary artery embolus identified Mediastinum/Nodes: No hilar or mediastinal adenopathy. The esophagus is grossly unremarkable. Stable 2.5 cm rounded lesion in the anterior mediastinum, likely a benign process. No mediastinal fluid collection. Lungs/Pleura: Right lung base linear atelectasis/scarring. No focal consolidation, pleural effusion, or pneumothorax. The central airways are patent. Upper Abdomen: Noncalcified gallstones. Musculoskeletal: Osteopenia with degenerative changes. No acute osseous pathology. Review of the MIP images confirms the above findings. IMPRESSION: 1. No acute intrathoracic pathology. No CT evidence of pulmonary embolism. 2. Dilatation of the main pulmonary trunk suggestive of pulmonary hypertension. 3. Cholelithiasis. 4.  Aortic Atherosclerosis (ICD10-I70.0). Electronically Signed   By: Elgie Collard M.D.   On: 02/07/2023 20:54   DG Chest Port 1 View  Result Date: 02/07/2023 CLINICAL DATA:  Shortness of breath EXAM: PORTABLE  CHEST 1 VIEW COMPARISON:  01/23/2022 FINDINGS: Cardiomegaly. No frank interstitial edema. No pleural effusion or pneumothorax. IMPRESSION: Cardiomegaly. No frank interstitial edema. Electronically Signed   By: Charline Bills M.D.   On: 02/07/2023 19:19   DG Tibia/Fibula Right  Result Date: 02/07/2023 CLINICAL DATA:  Larey Seat when washing her hair.  Pain. EXAM: PORTABLE RIGHT KNEE - 1-2 VIEW; RIGHT TIBIA AND FIBULA - 2 VIEW COMPARISON:  Right knee radiographs 01/20/2022, right tibia and fibula radiographs 08/25/2021 FINDINGS: Right knee: There is diffuse decreased bone mineralization. Bone-on-bone contact of the medial compartment. Large peripheral medial degenerative osteophytes. Moderate peripheral lateral compartment degenerative osteophytosis without joint space narrowing. Degenerative genu varum again noted. Severe patellofemoral joint space narrowing bone-on-bone contact and large peripheral osteophytes. There is an impaction of 14 mm ossicle within the suprapatellar joint space. Right tibia and fibula: There is a 6 mm chronic well corticated ossicle distal to the fibula, likely the sequela of remote trauma. Mild medial greater than lateral tibiotalar joint space narrowing. Mild distal anterior tibial plafond degenerative osteophytosis. Mild navicular-cuneiform joint space narrowing and dorsal osteophytosis on lateral view. No acute fracture or dislocation. IMPRESSION: 1. Severe medial and patellofemoral compartment osteoarthritis, similar to prior. 2. Mild tibiotalar and navicular-cuneiform osteoarthritis. 3. No acute fracture. Electronically Signed   By: Neita [name] M.D.   On: 02/07/2023 09:33   DG Knee Right Port  Result Date: 02/07/2023 CLINICAL DATA:  Larey Seat when washing her hair.  Pain. EXAM: PORTABLE RIGHT KNEE - 1-2 VIEW; RIGHT TIBIA AND FIBULA - 2 VIEW COMPARISON:  Right knee radiographs 01/20/2022, right tibia and fibula radiographs 08/25/2021 FINDINGS: Right knee: There is diffuse decreased  bone mineralization. Bone-on-bone contact of the medial compartment. Large peripheral medial degenerative osteophytes. Moderate peripheral lateral compartment degenerative osteophytosis without joint space narrowing. Degenerative genu varum again noted. Severe patellofemoral joint space narrowing bone-on-bone contact and large peripheral osteophytes. There is an impaction of 14 mm ossicle within the suprapatellar joint space. Right tibia and fibula:  There is a 6 mm chronic well corticated ossicle distal to the fibula, likely the sequela of remote trauma. Mild medial greater than lateral tibiotalar joint space narrowing. Mild distal anterior tibial plafond degenerative osteophytosis. Mild navicular-cuneiform joint space narrowing and dorsal osteophytosis on lateral view. No acute fracture or dislocation. IMPRESSION: 1. Severe medial and patellofemoral compartment osteoarthritis, similar to prior. 2. Mild tibiotalar and navicular-cuneiform osteoarthritis. 3. No acute fracture. Electronically Signed   By: Neita [name] M.D.   On: 02/07/2023 09:33   CT PELVIS WO CONTRAST  Result Date: 02/07/2023 CLINICAL DATA:  fall EXAM: CT PELVIS WITHOUT CONTRAST TECHNIQUE: Multidetector CT imaging of the pelvis was performed following the standard protocol without intravenous contrast. RADIATION DOSE REDUCTION: This exam was performed according to the departmental dose-optimization program which includes automated exposure control, adjustment of the mA and/or kV according to patient size and/or use of iterative reconstruction technique. COMPARISON:  None Available. FINDINGS: Urinary Tract:  No abnormality visualized. Bowel:  Unremarkable visualized pelvic bowel loops. Vascular/Lymphatic: No pathologically enlarged lymph nodes. No significant vascular abnormality seen. Reproductive:  Fibroid uterus. Musculoskeletal: No acute osseous abnormality. The bilateral femoral heads are seated in the acetabula. Degenerative changes of the  bilateral hips with superolateral joint space narrowing, marginal osteophytosis and subchondral cystic changes. The sacroiliac joints and pubic symphysis are anatomically aligned with degenerative changes. Multilevel degenerative changes of the visualized lower lumbar spine. IMPRESSION: 1. No acute osseous abnormality. 2. Moderate osteoarthritis of the hips. Electronically Signed   By: Hart Robinsons M.D.   On: 02/07/2023 09:14   CT Thoracic Spine Wo Contrast  Result Date: 02/07/2023 CLINICAL DATA:  Provided history: Fall.  Mid back pain.  Leg pain. EXAM: CT THORACIC SPINE WITHOUT CONTRAST TECHNIQUE: Multidetector CT images of the thoracic were obtained using the standard protocol without intravenous contrast. RADIATION DOSE REDUCTION: This exam was performed according to the departmental dose-optimization program which includes automated exposure control, adjustment of the mA and/or kV according to patient size and/or use of iterative reconstruction technique. COMPARISON:  Chest CT 03/22/2019. FINDINGS: Alignment: No significant spondylolisthesis. Vertebrae: Mild chronic anterior wedge deformity of the T7 vertebral body, unchanged from the prior chest CT of 03/22/2019. Vertebral body height is maintained at the remaining thoracic levels. No evidence of an acute fracture to the thoracic spine. Osseous fusion across the disc space at T8-T9. Early osseous fusion also suspected across the T9-T10 disc space. Ventrolateral osteophytes at T6-T7, T7-T8, T8-T9 and T9-T10. Hemangiomas within the T3 and T5 vertebrae. Paraspinal and other soft tissues: No acute finding within included portions of the thorax or upper abdomen/retroperitoneum. Aortic atherosclerosis. No paraspinal mass or collection. Disc levels: Osseous fusion across the disc space at T8-T9. Early osseous fusion also suspected across the T9-T10 disc space. Disc space narrowing elsewhere within the thoracic spine, greatest at T7-T8 (moderate-to-advanced at  this level). No significant spinal canal stenosis is appreciated. Endplate spurring results in bilateral bony neural foraminal narrowing at T9-T10 (moderate right, mild left). IMPRESSION: 1. No evidence of an acute thoracic spine fracture. 2. Mild chronic anterior wedge deformity of the T7 vertebral body, unchanged from the prior chest CT of 03/22/2019. 3. Thoracic spondylosis as described. No significant spinal canal stenosis is appreciated. Endplate spurring results in bilateral bony neural foraminal narrowing at T9-T10 (moderate right, mild left). Osseous fusion across the T8-T9 disc space. Early osseous fusion also suspected across the T9-T10 disc space. Electronically Signed   By: Jackey Loge D.O.   On: 02/07/2023 08:32  CT Lumbar Spine Wo Contrast  Result Date: 02/07/2023 CLINICAL DATA:  67 year old female status post fall with pain. EXAM: CT LUMBAR SPINE WITHOUT CONTRAST TECHNIQUE: Multidetector CT imaging of the lumbar spine was performed without intravenous contrast administration. Multiplanar CT image reconstructions were also generated. RADIATION DOSE REDUCTION: This exam was performed according to the departmental dose-optimization program which includes automated exposure control, adjustment of the mA and/or kV according to patient size and/or use of iterative reconstruction technique. COMPARISON:  CT cervical and thoracic Spine today reported separately. 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 Correlation with radiographs is recommended prior to any operative intervention. Alignment: Mildly exaggerated lumbar lordosis. No significant scoliosis or spondylolisthesis. Vertebrae: Thoracic spine reported separately. Lumbar vertebrae appear intact. L3 vertebral body benign hemangioma (normal variant). Degenerative endplate changes L2-L3 through L5-S1. Visible sacrum and SI joints appear intact with degenerative vacuum SI  joint phenomena. Paraspinal and other soft tissues: Costophrenic angle respiratory motion. Negative visible noncontrast abdominal viscera. Calcified iliac artery atherosclerosis. Lumbar paraspinal soft tissues are within normal limits. Disc levels: Mild for age spine degeneration T12-L1 and L1-L2. L2-L3: Disc space loss and vacuum disc. Circumferential disc bulge. Mild to moderate facet and ligament flavum hypertrophy. Vacuum facet on the right. Mild spinal stenosis suspected. Mild L2 foraminal stenosis. L3-L4: Similar vacuum disc. Moderate to severe facet and ligament flavum hypertrophy and vacuum facet on the right. Circumferential disc bulge contributing to mild to moderate spinal stenosis at this level. Mild to moderate left, mild right L3 foraminal stenosis. L4-L5: Less pronounced vacuum disc. Circumferential disc bulge. Moderate to severe facet and ligament flavum hypertrophy. Vacuum facet on the right. Mild to moderate spinal stenosis suspected. Severe bilateral L4 neural foraminal stenosis. L5-S1:  Sacralized and negative. IMPRESSION: 1. 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 Correlation with radiographs is recommended prior to any operative intervention. 2. No acute traumatic injury identified in the Lumbar Spine. 3. 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 Electronically Signed   By: Odessa Fleming M.D.   On: 02/07/2023 08:28   CT Head Wo Contrast  Result Date: 02/07/2023 CLINICAL DATA:  Provided history: Polytrauma, blunt. Fall. Back pain. Leg pain. EXAM: CT HEAD WITHOUT CONTRAST CT CERVICAL SPINE WITHOUT CONTRAST TECHNIQUE: Multidetector CT imaging of the head and cervical spine was performed following the standard protocol without intravenous contrast. Multiplanar CT image reconstructions of the cervical spine were also generated. RADIATION DOSE  REDUCTION: This exam was performed according to the departmental dose-optimization program which includes automated exposure control, adjustment of the mA and/or kV according to patient size and/or use of iterative reconstruction technique. COMPARISON:  None. FINDINGS: CT HEAD FINDINGS Brain: No age advanced or lobar predominant parenchymal atrophy. Mild-to-moderate patchy and ill-defined hypoattenuation within the cerebral white matter. There are a few small nonspecific calcifications scattered within the bilateral cerebral hemispheres. There is no acute intracranial hemorrhage. No demarcated cortical infarct. No extra-axial fluid collection. No evidence of an intracranial mass. No midline shift. Vascular: No hyperdense vessel.  Atherosclerotic calcifications. Skull: No calvarial fracture or aggressive osseous lesion. Sinuses/Orbits: No mass or acute finding within the imaged orbits. No significant paranasal sinus disease at the imaged levels. CT CERVICAL SPINE FINDINGS Alignment: Straightening of the expected cervical lordosis. No significant spondylolisthesis. Skull base and vertebrae: The basion-dental and atlanto-dental intervals are maintained.No evidence of acute fracture  to the cervical spine. Soft tissues and spinal canal: No prevertebral fluid or swelling. No visible canal hematoma. Disc levels: Cervical spondylosis with multilevel disc space narrowing, disc bulges/central disc protrusions, posterior disc osteophyte complexes and uncovertebral hypertrophy. Disc space narrowing is greatest at C4-C5 and C5-C6 (advanced at these levels). Multilevel spinal canal stenosis. Most notably at C5-C6, a posterior disc osteophyte complex contributes to spinal canal stenosis which appears moderate in severity. Multilevel bony neural foraminal narrowing. Upper chest: No consolidation within the imaged lung apices. No visible pneumothorax. IMPRESSION: CT head: 1. No evidence of an acute intracranial abnormality. 2.  Mild-to-moderate, nonspecific cerebral white matter disease. CT cervical spine: 1. No evidence of an acute cervical spine fracture. 2. Nonspecific straightening of the expected cervical lordosis. 3. Cervical spondylosis as described. Electronically Signed   By: Jackey Loge D.O.   On: 02/07/2023 08:17   CT Cervical Spine Wo Contrast  Result Date: 02/07/2023 CLINICAL DATA:  Provided history: Polytrauma, blunt. Fall. Back pain. Leg pain. EXAM: CT HEAD WITHOUT CONTRAST CT CERVICAL SPINE WITHOUT CONTRAST TECHNIQUE: Multidetector CT imaging of the head and cervical spine was performed following the standard protocol without intravenous contrast. Multiplanar CT image reconstructions of the cervical spine were also generated. RADIATION DOSE REDUCTION: This exam was performed according to the departmental dose-optimization program which includes automated exposure control, adjustment of the mA and/or kV according to patient size and/or use of iterative reconstruction technique. COMPARISON:  None. FINDINGS: CT HEAD FINDINGS Brain: No age advanced or lobar predominant parenchymal atrophy. Mild-to-moderate patchy and ill-defined hypoattenuation within the cerebral white matter. There are a few small nonspecific calcifications scattered within the bilateral cerebral hemispheres. There is no acute intracranial hemorrhage. No demarcated cortical infarct. No extra-axial fluid collection. No evidence of an intracranial mass. No midline shift. Vascular: No hyperdense vessel.  Atherosclerotic calcifications. Skull: No calvarial fracture or aggressive osseous lesion. Sinuses/Orbits: No mass or acute finding within the imaged orbits. No significant paranasal sinus disease at the imaged levels. CT CERVICAL SPINE FINDINGS Alignment: Straightening of the expected cervical lordosis. No significant spondylolisthesis. Skull base and vertebrae: The basion-dental and atlanto-dental intervals are maintained.No evidence of acute fracture to  the cervical spine. Soft tissues and spinal canal: No prevertebral fluid or swelling. No visible canal hematoma. Disc levels: Cervical spondylosis with multilevel disc space narrowing, disc bulges/central disc protrusions, posterior disc osteophyte complexes and uncovertebral hypertrophy. Disc space narrowing is greatest at C4-C5 and C5-C6 (advanced at these levels). Multilevel spinal canal stenosis. Most notably at C5-C6, a posterior disc osteophyte complex contributes to spinal canal stenosis which appears moderate in severity. Multilevel bony neural foraminal narrowing. Upper chest: No consolidation within the imaged lung apices. No visible pneumothorax. IMPRESSION: CT head: 1. No evidence of an acute intracranial abnormality. 2. Mild-to-moderate, nonspecific cerebral white matter disease. CT cervical spine: 1. No evidence of an acute cervical spine fracture. 2. Nonspecific straightening of the expected cervical lordosis. 3. Cervical spondylosis as described. Electronically Signed   By: Jackey Loge D.O.   On: 02/07/2023 08:17    Pending Labs Unresulted Labs (From admission, onward)     Start     Ordered   02/07/23 2340  HIV Antibody (routine testing w rflx)  (HIV Antibody (Routine testing w reflex) panel)  Once,   R        02/07/23 2341            Vitals/Pain Today's Vitals   02/08/23 0700 02/08/23 0709 02/08/23 0728 02/08/23 0822  BP: Marland Kitchen)  147/78     Pulse: (!) 105     Resp: (!) 25     Temp:  99 F (37.2 C)    TempSrc:  Oral    SpO2: 95%     PainSc:   10-Worst pain ever 8     Isolation Precautions No active isolations  Medications Medications  levalbuterol (XOPENEX) nebulizer solution 0.63 mg (has no administration in time range)  acetaminophen (TYLENOL) tablet 2,000 mg (has no administration in time range)  DULoxetine (CYMBALTA) DR capsule 120 mg (has no administration in time range)  diclofenac Sodium (VOLTAREN) 1 % topical gel 2 g (has no administration in time range)   busPIRone (BUSPAR) tablet 5 mg (has no administration in time range)  traMADol (ULTRAM) tablet 50 mg (has no administration in time range)  gabapentin (NEURONTIN) capsule 300 mg (has no administration in time range)  oxyCODONE-acetaminophen (PERCOCET/ROXICET) 5-325 MG per tablet 1 tablet (1 tablet Oral Given 02/07/23 0657)  gabapentin (NEURONTIN) capsule 300 mg (300 mg Oral Given 02/07/23 0931)  acetaminophen (TYLENOL) tablet 650 mg (650 mg Oral Given 02/07/23 0928)  acetaminophen (TYLENOL) tablet 1,000 mg (1,000 mg Oral Given 02/07/23 1746)  iohexol (OMNIPAQUE) 350 MG/ML injection 80 mL (100 mLs Intravenous Contrast Given 02/07/23 1937)    Mobility non-ambulatory

## 2023-02-08 NOTE — ED Notes (Signed)
Provided pt with breakfast tray at bedside.  She awoke and responded thanks and went back to sleep

## 2023-02-08 NOTE — TOC Initial Note (Addendum)
Transition of Care [facility]) - Initial/Assessment Note    Patient Details  Name: Janice Ryan MRN: [id number] Date of Birth: Jan 15, 1956  Transition of Care [facility]) CM/SW Contact:    Lavenia Atlas, RN Phone Number: 02/08/2023, 5:00 PM  Clinical Narrative:    This RNCM spoke with patient at bedside to provide short term SNF bed offers, Medicare.gov ([facility], [facility], [facility], [facility], [facility]). Patient will review and make choice. Patient is a retired Engineer, site. Patient reports prior to admission she has DME: RW, wc, walker, home oxygen with [employer]: [facility]LNC. Patient reports she has used [facility] in the past and does not wish to use [facility] again. PT has recommended short term SNF, awaiting patient choice and will need to initiate insurance auth once choice is made.    Note: PASRR has patient's name as Janice Ryan with PASRR# [id number]   TOC will continue to follow              Expected Discharge Plan: Skilled Nursing Facility Barriers to Discharge: Continued Medical Work up   Patient Goals and CMS Choice Patient states their goals for this hospitalization and ongoing recovery are:: to feel better and get stronger CMS Medicare.gov Compare Post Acute Care list provided to:: Patient Choice offered to / list presented to : Patient [place] ownership interest in [facility].provided to:: Patient    Expected Discharge Plan and Services In-house Referral: NA Discharge Planning Services: CM Consult Post Acute Care Choice: NA Living arrangements for the past 2 months: Apartment                 DME Arranged: N/A DME Agency: NA       HH Arranged: NA HH Agency: NA        Prior Living Arrangements/Services Living arrangements for the past 2 months: Apartment Lives with:: Self, Adult Children Patient language and need for interpreter reviewed:: Yes Do you feel safe going back to the place where you live?: Yes      Need  for Family Participation in Patient Care: No (Comment) Care giver support system in place?: Yes (comment) Current home services: DME (wheelchair, rolling walker, walker) Criminal Activity/Legal Involvement Pertinent to Current Situation/Hospitalization: No - Comment as needed  Activities of Daily Living      Permission Sought/Granted Permission sought to share information with : Case Manager, Magazine features editor Permission granted to share information with : Yes, Verbal Permission Granted  Share Information with NAME: Case Manager  Permission granted to share info w AGENCY: SNF's        Emotional Assessment Appearance:: Appears stated age Attitude/Demeanor/Rapport: Gracious, Charismatic Affect (typically observed): Accepting Orientation: : Oriented to Self, Oriented to Place, Oriented to  Time, Oriented to Situation Alcohol / Substance Use: Not Applicable Psych Involvement: No (comment)  Admission diagnosis:  Fall, initial encounter [W19.XXXA] Chronic pain of right knee [M25.561, G89.29] Acute on chronic respiratory failure with hypoxia and hypercapnia (HCC) [J96.21, J96.22] Chronic back pain, unspecified back location, unspecified back pain laterality [M54.9, G89.29] Patient Active Problem List   Diagnosis Date Noted   OSA (obstructive sleep apnea) 02/07/2023   Obesity hypoventilation syndrome (HCC) 02/07/2023   Asthma 02/07/2023   Suspected novel influenza A virus infection 02/01/2022   AKI (acute kidney injury) (HCC) 01/25/2022   Hypercalcemia 01/24/2022   Pneumonia due to infectious organism 01/23/2022   Acute on chronic respiratory failure with hypoxia and hypercapnia (HCC) 01/21/2022   Fall at home, initial encounter 01/21/2022  Poor social situation 01/21/2022   Major depressive disorder, recurrent episode, 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   Chronic respiratory failure with hypoxia (HCC) 12/10/2018   HTN (hypertension) 12/10/2018   Chronic pain of right knee     Moderate persistent asthma 07/28/2018   PCP:  [name], NP Pharmacy:   [id number] [place], [place] - [address] [place] [address] Phone: [phone] Fax: [phone]     Social Determinants of Health (SDOH) Social History: SDOH Screenings   Food Insecurity: No Food Insecurity (01/24/2022)  Housing: Low Risk  (01/24/2022)  Transportation Needs: No Transportation Needs (01/24/2022)  Utilities: Not At Risk (01/24/2022)  Depression (PHQ2-9): Low Risk  (07/06/2021)  Social Connections: Unknown (10/10/2022)   Received from [facility]  Tobacco Use: Low Risk  (02/08/2023)   SDOH Interventions:     Readmission Risk Interventions    02/08/2023    4:56 PM  Readmission Risk Prevention Plan  Post Dischage Appt Complete  Medication Screening Complete  Transportation Screening Complete

## 2023-02-08 NOTE — Plan of Care (Signed)
  Problem: Education: Goal: Knowledge of General Education information will improve Description: Including pain rating scale, medication(s)/side effects and non-pharmacologic comfort measures Outcome: Progressing   Problem: Health Behavior/Discharge Planning: Goal: Ability to manage health-related needs will improve Outcome: Progressing   Problem: Clinical Measurements: Goal: Respiratory complications will improve Outcome: Progressing   Problem: Nutrition: Goal: Adequate nutrition will be maintained Outcome: Progressing   Problem: Coping: Goal: Level of anxiety will decrease Outcome: Progressing   Problem: Pain Management: Goal: General experience of comfort will improve Outcome: Progressing   Problem: Safety: Goal: Ability to remain free from injury will improve Outcome: Progressing

## 2023-02-08 NOTE — ED Notes (Signed)
patient is requesting home/psych meds and pain meds. She has stated she hasn't taken them in 3 days MD notifed

## 2023-02-08 NOTE — Progress Notes (Signed)
  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  Pancreatic colonic inflammation 03/2019-also Mediastinal nodule 2.8 x 2.2 cm--- this was recommended for follow-up in the outpatient setting HTN Known osteoarthritis with prosthetic left knee Chronic lower extremity edema with recurrent cellulitis Also has chronic pain and is followed with pain medicine at [facility]--     Notes indicate fall February 05, 2023 at home-presented to ED and was treated for pain-she then became hypoxic sats dropped into the 60s she was slightly confused Because of confusion workup was pursued showing ABG with pCO2 70 pH 7.3 BNP 118 CT angio showed no pulmonary embolism, pulmonary hypertension no evidence  I saw her after midnight she is comfortable awake oriented she complains of several unrelated nonspecific issues including an abdominal discomfort that is been going on at least for a year in the right lower quadrant which has worsened in addition to inability to really sit up and bear weight as well as ambulate She has been told in the past that she would be a good candidate for [facility] or weight loss surgery and I agree with this and she will need to follow-up with her PCP to coordinate the same  Patient reviewed in stepdown around midday and appeared to be much more oriented tolerating lunch  O/e BP 131/71   Pulse 90   Temp 98.3 F (36.8 C) (Oral)   Resp 18   Wt (!) 193 kg   SpO2 92%   BMI 66.64 kg/m  EOMI NCAT no focal deficit no icterus no pallor very thick neck Mallampati 4 CTAB no added sound Abdomen obese nontender but with deep palpation in the right lower quadrant has some discomfort She has some changes of MASD and intertrigo She has lymphedema Did not  examine her genital area   P   patient was hypercarbic earlier in the hospital stay in the ED which is why we admitted her her hypercarbia is now resolved after being placed on the Tilogy she is much more awake  Patient will need placement because of inability to ambulate she has no support at home--- her son is a Environmental consultant and her husband has significant lower extremity wounds and cannot take care of her independently She has no trauma to any of the joints after the fall She will need a trilogy vent and make sure that this fits as the mask was not fitting and she was only using 5 L of oxygen TOC will play a big role in coordinating further outpatient management and care I have cautioned her that for pain we would use mainly Tylenol high-dose daily as needed pain tramadol very sparingly 50 every 12 as needed I have resumed cautiously her BuSpar 53 times daily and Cymbalta 120   I have added back her gabapentin at night at a lower dose of 100

## 2023-02-08 NOTE — Telephone Encounter (Signed)
Noted  

## 2023-02-09 ENCOUNTER — Ambulatory Visit: Payer: Self-pay

## 2023-02-09 DIAGNOSIS — J9622 Acute and chronic respiratory failure with hypercapnia: Secondary | ICD-10-CM | POA: Diagnosis not present

## 2023-02-09 DIAGNOSIS — J9621 Acute and chronic respiratory failure with hypoxia: Secondary | ICD-10-CM | POA: Diagnosis not present

## 2023-02-09 LAB — CBC WITH DIFFERENTIAL/PLATELET
Abs Immature Granulocytes: 0.05 10*3/uL (ref 0.00–0.07)
Basophils Absolute: 0 10*3/uL (ref 0.0–0.1)
Basophils Relative: 0 %
Eosinophils Absolute: 0.2 10*3/uL (ref 0.0–0.5)
Eosinophils Relative: 2 %
HCT: 36.9 % (ref 36.0–46.0)
Hemoglobin: 10.7 g/dL — ABNORMAL LOW (ref 12.0–15.0)
Immature Granulocytes: 1 %
Lymphocytes Relative: 18 %
Lymphs Abs: 1.8 10*3/uL (ref 0.7–4.0)
MCH: 28.3 pg (ref 26.0–34.0)
MCHC: 29 g/dL — ABNORMAL LOW (ref 30.0–36.0)
MCV: 97.6 fL (ref 80.0–100.0)
Monocytes Absolute: 0.9 10*3/uL (ref 0.1–1.0)
Monocytes Relative: 9 %
Neutro Abs: 7.3 10*3/uL (ref 1.7–7.7)
Neutrophils Relative %: 70 %
Platelets: 220 10*3/uL (ref 150–400)
RBC: 3.78 MIL/uL — ABNORMAL LOW (ref 3.87–5.11)
RDW: 12.2 % (ref 11.5–15.5)
WBC: 10.3 10*3/uL (ref 4.0–10.5)
nRBC: 0 % (ref 0.0–0.2)

## 2023-02-09 LAB — COMPREHENSIVE METABOLIC PANEL
ALT: 10 U/L (ref 0–44)
AST: 10 U/L — ABNORMAL LOW (ref 15–41)
Albumin: 3.3 g/dL — ABNORMAL LOW (ref 3.5–5.0)
Alkaline Phosphatase: 62 U/L (ref 38–126)
Anion gap: 6 (ref 5–15)
BUN: 16 mg/dL (ref 8–23)
CO2: 29 mmol/L (ref 22–32)
Calcium: 9.7 mg/dL (ref 8.9–10.3)
Chloride: 101 mmol/L (ref 98–111)
Creatinine, Ser: 0.86 mg/dL (ref 0.44–1.00)
GFR, Estimated: >60 mL/min (ref >60)
Glucose, Bld: 112 mg/dL — ABNORMAL HIGH (ref 70–99)
Potassium: 3.9 mmol/L (ref 3.5–5.1)
Sodium: 136 mmol/L (ref 135–145)
Total Bilirubin: 0.7 mg/dL (ref <1.2)
Total Protein: 6.8 g/dL (ref 6.5–8.1)

## 2023-02-09 MED ORDER — ENOXAPARIN SODIUM 100 MG/ML IJ SOSY
100.0000 mg | PREFILLED_SYRINGE | Freq: Every day | INTRAMUSCULAR | Status: DC
  Administered 2023-02-09 – 2023-02-16 (×8): 100 mg via SUBCUTANEOUS
  Filled 2023-02-09 (×8): qty 1

## 2023-02-09 MED ORDER — PNEUMOCOCCAL 20-VAL CONJ VACC 0.5 ML IM SUSY
0.5000 mL | PREFILLED_SYRINGE | INTRAMUSCULAR | Status: AC
  Administered 2023-02-10: 0.5 mL via INTRAMUSCULAR
  Filled 2023-02-09: qty 0.5

## 2023-02-09 MED ORDER — MELOXICAM 15 MG PO TABS
15.0000 mg | ORAL_TABLET | Freq: Every day | ORAL | Status: DC
  Administered 2023-02-10 – 2023-02-13 (×4): 15 mg via ORAL
  Filled 2023-02-09 (×4): qty 1

## 2023-02-09 MED ORDER — FLUTICASONE PROPIONATE 50 MCG/ACT NA SUSP
2.0000 | Freq: Every day | NASAL | Status: DC | PRN
  Administered 2023-02-12: 2 via NASAL
  Filled 2023-02-09: qty 16

## 2023-02-09 MED ORDER — INFLUENZA VAC A&B SURF ANT ADJ 0.5 ML IM SUSY
0.5000 mL | PREFILLED_SYRINGE | INTRAMUSCULAR | Status: AC
  Administered 2023-02-10: 0.5 mL via INTRAMUSCULAR
  Filled 2023-02-09: qty 0.5

## 2023-02-09 MED ORDER — LISINOPRIL 20 MG PO TABS
20.0000 mg | ORAL_TABLET | Freq: Every day | ORAL | Status: DC
  Administered 2023-02-09 – 2023-02-13 (×5): 20 mg via ORAL
  Filled 2023-02-09 (×3): qty 1
  Filled 2023-02-09: qty 2
  Filled 2023-02-09: qty 1

## 2023-02-09 MED ORDER — DOXEPIN HCL 50 MG PO CAPS
50.0000 mg | ORAL_CAPSULE | Freq: Every day | ORAL | Status: DC
  Administered 2023-02-10 – 2023-02-16 (×7): 50 mg via ORAL
  Filled 2023-02-09 (×7): qty 1

## 2023-02-09 MED ORDER — ACETAMINOPHEN 500 MG PO TABS
1000.0000 mg | ORAL_TABLET | Freq: Three times a day (TID) | ORAL | Status: DC | PRN
  Administered 2023-02-09 – 2023-02-15 (×11): 1000 mg via ORAL
  Filled 2023-02-09 (×12): qty 2

## 2023-02-09 MED ORDER — GABAPENTIN 300 MG PO CAPS
300.0000 mg | ORAL_CAPSULE | Freq: Every day | ORAL | Status: DC
  Administered 2023-02-09 – 2023-02-12 (×4): 300 mg via ORAL
  Filled 2023-02-09 (×4): qty 1

## 2023-02-09 MED ORDER — GERHARDT'S BUTT CREAM
TOPICAL_CREAM | Freq: Two times a day (BID) | CUTANEOUS | Status: DC
  Administered 2023-02-13 – 2023-02-16 (×3): 1 via TOPICAL
  Filled 2023-02-09 (×2): qty 1

## 2023-02-09 MED ORDER — CAPSAICIN 0.075 % EX CREA
TOPICAL_CREAM | Freq: Two times a day (BID) | CUTANEOUS | Status: DC
  Administered 2023-02-13: 1 via TOPICAL
  Filled 2023-02-09: qty 57

## 2023-02-09 NOTE — Progress Notes (Signed)
   02/09/23 0010  BiPAP/CPAP/SIPAP  BiPAP/CPAP/SIPAP Pt Type Adult  BiPAP/CPAP/SIPAP V60  Mask Type Full face mask  Mask Size Large  Set Rate 14 breaths/min  Respiratory Rate 18 breaths/min  IPAP 14 cmH20  EPAP 8 cmH2O  FiO2 (%) 40 %  Minute Ventilation 6.7  Leak 12  Peak Inspiratory Pressure (PIP) 14  Tidal Volume (Vt) 462  Patient Home Equipment No  Auto Titrate No  Press High Alarm 30 cmH2O  Press Low Alarm 5 cmH2O  CPAP/SIPAP surface wiped down Yes  Oxygen Percent 40 % (Pt. found on 6 lmp n/c.)

## 2023-02-09 NOTE — Progress Notes (Signed)
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  Pancreatic colonic inflammation 03/2019-also Mediastinal nodule 2.8 x 2.2 cm--- this was recommended for follow-up in the outpatient setting HTN Known osteoarthritis with prosthetic left knee Chronic lower extremity edema with recurrent cellulitis Also has chronic pain and is followed with pain medicine at [facility]--     Notes indicate fall February 05, 2023 at home-presented to ED and was treated for pain-she then became hypoxic sats dropped into the 60s she was slightly confused Because of confusion workup was pursued showing ABG with pCO2 70 pH 7.3 BNP 118 CT angio showed no pulmonary embolism, pulmonary hypertension no evidence   Plan  Acute hypoxic/hypercarbic respiratory failure on admission Chronic respiratory failure on 5 L at baseline 2/2 iatrogenic pain control in the emergency room Much improved but mandatory ventilator with large size mask (was not able to use small size mask or find a mask that fit her at home)  Accidental fall with severe knee pain X-ray shows severe arthritis but she is unable to even bend her knee on exam today I will MRI the knee I have explained to her the risk of narcotic pain meds as well as significant risk of encephalopathy given her habitus and we will increase her gabapentin to 300 twice daily My preference would be to avoid tramadol as best possible, high-dose Tylenol 1000 every 8 as needed, Mobic 15 daily and use additive benefit of Cymbalta 124 some component to  pain control It is unclear if she would be a good surgical candidate--she follows with [facility] and had a left knee surgery previously at [facility] several years ago Use capsaicin for topical pain control 20, may use diclofenac also for topical pain relief  Restrictive and obstructive lung disease Slept well with trilogy vent-will see how we can qualify her for the same  Depression Continue BuSpar 5 3 times daily, doxepin 50 Cymbalta as above I would not restart the patient on Adderall if she is on it, I would discontinue trazodone at night  Hypertension Blood pressure is not controlled-reinitiate lisinopril home dose 20 daily  Super morbid obesity BMI >50 Life-threatening-she understands need for weight loss She was attempting to get onto [facility] or weight loss supplement I will ask her about metformin in the next several days She may benefit from weight loss surgery referral   DVT prophylaxis: Heparin  Status is: Inpatient Remains inpatient appropriate because:   Requires SNF awaiting auth    Subjective: Awake coherent pleasant no distress looks fair Pain is uncontrolled-we had a good discussion about changing various meds and seeing what topical meds would help We will try to MRI her knee as she cannot bend it but previously she was able to ambulate with her rollator  Objective + exam Vitals:   02/09/23 0420 02/09/23 0500 02/09/23 0631 02/09/23 0645  BP:  (!) 144/75 101/66   Pulse:  81 86   Resp:  19 16   Temp: 98.1 F (36.7 C)   98.2 F (36.8 C)  TempSrc: Axillary   Oral  SpO2:  94% 98%   Weight:       Filed Weights   02/08/23 1033  Weight: (!) 193 kg    Examination:  Obese pleasant black female no distress CTAB no added sound but poor exam Abdomen nondistended no rebound ROM intact Cannot flex hip to chest  Data Reviewed: reviewed   CBC    Component Value Date/Time   WBC 10.3 02/09/2023 0254   RBC 3.78 (L) 02/09/2023 0254   HGB 10.7  (L) 02/09/2023 0254   HCT 36.9 02/09/2023 0254   PLT 220 02/09/2023 0254   MCV 97.6 02/09/2023 0254   MCH 28.3 02/09/2023 0254   MCHC 29.0 (L) 02/09/2023 0254   RDW 12.2 02/09/2023 0254   LYMPHSABS 1.8 02/09/2023 0254   MONOABS 0.9 02/09/2023 0254   EOSABS 0.2 02/09/2023 0254   BASOSABS 0.0 02/09/2023 0254      Latest Ref Rng & Units 02/09/2023    2:54 AM 02/07/2023    5:25 PM 01/29/2022    1:14 AM  CMP  Glucose 70 - 99 mg/dL 161  096  045   BUN 8 - 23 mg/dL 16  17  18    Creatinine 0.44 - 1.00 mg/dL 4.09  8.11  9.14   Sodium 135 - 145 mmol/L 136  139  137   Potassium 3.5 - 5.1 mmol/L 3.9  4.1  4.1   Chloride 98 - 111 mmol/L 101  99  95   CO2 22 - 32 mmol/L 29  32  34   Calcium 8.9 - 10.3 mg/dL 9.7  9.8  78.2   Total Protein 6.5 - 8.1 g/dL 6.8  7.2    Total Bilirubin <1.2 mg/dL 0.7  0.7    Alkaline Phos 38 - 126 U/L 62  72    AST 15 - 41 U/L 10  12    ALT 0 - 44 U/L 10  11       Scheduled Meds:  busPIRone  5 mg Oral TID   Chlorhexidine Gluconate Cloth  6 each Topical Daily   DULoxetine  120 mg Oral Daily   gabapentin  100 mg Oral QHS   mouth rinse  15 mL Mouth Rinse 4 times per day   Continuous Infusions:  Time  55  Rhetta Mura, MD  [facility]

## 2023-02-09 NOTE — Progress Notes (Signed)
   02/09/23 0500  BiPAP/CPAP/SIPAP  BiPAP/CPAP/SIPAP Pt Type Adult  BiPAP/CPAP/SIPAP V60  Mask Type Full face mask  Mask Size Large  Set Rate 14 breaths/min  Respiratory Rate 20 breaths/min  IPAP 14 cmH20  EPAP 8 cmH2O  FiO2 (%) 40 %  Minute Ventilation 10  Leak 6  Peak Inspiratory Pressure (PIP) 14  Tidal Volume (Vt) 525  Patient Home Equipment No  Auto Titrate No  Press High Alarm 30 cmH2O  Press Low Alarm 5 cmH2O  Oxygen Percent 40 %

## 2023-02-09 NOTE — Telephone Encounter (Signed)
    Chief Complaint: Pt. Currently admitted to the hospital. Asking for PCP to call her to discuss care. Symptoms:  Frequency:  Pertinent Negatives: Patient denies  Disposition: [] ED /[] Urgent Care (no appt availability in office) / [] Appointment(In office/virtual)/ []  [place] [facility]/ [] Home Care/ [] Refused Recommended Disposition /[] [place] [facility]/ [x]  Follow-up with PCP Additional Notes: Please advise pt.  Reason for Disposition  [1] Follow-up call from patient regarding patient's clinical status AND [2] information NON-URGENT  Answer Assessment - Initial Assessment Questions 1. REASON FOR CALL or QUESTION: "What is your reason for calling today?" or "How can I best help you?" or "What question do you have that I can help answer?"     Pt. Currently admitted in the hospital. Asking for PCP to call her to discuss care. 2. CALLER: Document the source of call. (e.g., laboratory, patient).     Patient.  Protocols used: PCP Call - No Triage-A-AH

## 2023-02-09 NOTE — TOC Progression Note (Addendum)
Transition of Care [facility]) - Progression Note    Patient Details  Name: Janice Ryan MRN: [id number] Date of Birth: [birth year]  [facility]) CM/SW Contact  Lavenia Atlas, RN Phone Number: 02/09/2023, 11:58 AM  Clinical Narrative:   This RN CM spoke with patient at bedside, who chose [facility] for short term SNF. This RNCM spoke with Nelma Rothman at [facility] who is checking to make sure bed is available today. Will await call back.  This RNCM initiated short term SNF insurance auth with [facility] Him [phone]).   - 12:10pm Awaiting MD to sign FL2 note.  - 12:20pm MD signed Cl[place]is Cao, insurance auth via [place] pending.  TOC will continue to follow    Expected Discharge Plan: Skilled Nursing Facility Barriers to Discharge: Continued Medical Work up  Expected Discharge Plan and Services In-house Referral: NA Discharge Planning Services: CM Consult Post Acute Care Choice: NA Living arrangements for the past 2 months: Apartment                 DME Arranged: N/A DME Agency: NA       HH Arranged: NA HH Agency: NA         Social Determinants of Health (SDOH) Interventions SDOH Screenings   Food Insecurity: No Food Insecurity (01/24/2022)  Housing: Low Risk  (01/24/2022)  Transportation Needs: No Transportation Needs (01/24/2022)  Utilities: Not At Risk (01/24/2022)  Depression (PHQ2-9): Low Risk  (07/06/2021)  Social Connections: Unknown (10/10/2022)   Received from [facility]  Tobacco Use: Low Risk  (02/08/2023)    Readmission Risk Interventions    02/08/2023    4:56 PM  Readmission Risk Prevention Plan  Post Dischage Appt Complete  Medication Screening Complete  Transportation Screening Complete

## 2023-02-09 NOTE — Progress Notes (Signed)
PT Cancellation Note  Patient Details Name: Kindred Thrailkill MRN: [id number] DOB: April 28, 1955   Cancelled Treatment:     Pt declined to attempt Physical Therapy due to her pain level.  Rehab staff to attempt another day as schedule permits/pt ability.   Felecia Shelling  PTA Acute  Rehabilitation Services Office M-F          [phone]

## 2023-02-09 NOTE — Plan of Care (Signed)
  Problem: Education: Goal: Knowledge of General Education information will improve Description: Including pain rating scale, medication(s)/side effects and non-pharmacologic comfort measures Outcome: Progressing   Problem: Nutrition: Goal: Adequate nutrition will be maintained Outcome: Progressing   Problem: Coping: Goal: Level of anxiety will decrease Outcome: Progressing   Problem: Elimination: Goal: Will not experience complications related to urinary retention Outcome: Progressing   Problem: Pain Management: Goal: General experience of comfort will improve Outcome: Progressing   Problem: Safety: Goal: Ability to remain free from injury will improve Outcome: Progressing   Problem: Skin Integrity: Goal: Risk for impaired skin integrity will decrease Outcome: Progressing

## 2023-02-09 NOTE — NC FL2 (Signed)
[place] MEDICAID FL2 LEVEL OF CARE FORM     IDENTIFICATION  Patient Name: Janice Ryan Birthdate: January 14, 1956 Sex: female Admission Date (Current Location): 02/07/2023  [facility] and [place] Number:  Producer, television/film/video and Address:  [facility],  [address] [place], [place] [address]      Provider Number: [id number]  Attending Physician Name and Address:  Rhetta Mura, MD  Relative Name and Phone Number:  Eli Phillips [phone]    Current Level of Care: Hospital Recommended Level of Care: Skilled Nursing Facility Prior Approval Number:    Date Approved/Denied:   PASRR Number: [id number]  Discharge Plan: SNF    Current Diagnoses: Patient Active Problem List   Diagnosis Date Noted   OSA (obstructive sleep apnea) 02/07/2023   Obesity hypoventilation syndrome (HCC) 02/07/2023   Asthma 02/07/2023   Suspected novel influenza A virus infection 02/01/2022   AKI (acute kidney injury) (HCC) 01/25/2022   Hypercalcemia 01/24/2022   Pneumonia due to infectious organism 01/23/2022   Acute on chronic respiratory failure with hypoxia and hypercapnia (HCC) 01/21/2022   Fall at home, initial encounter 01/21/2022   Poor social situation 01/21/2022   Major depressive disorder, recurrent episode, 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   Chronic respiratory failure with hypoxia (HCC) 12/10/2018   HTN (hypertension) 12/10/2018   Chronic pain of right knee    Moderate persistent asthma 07/28/2018    Orientation RESPIRATION BLADDER Height & Weight     Self, Time, Situation, Place  O2 (4L [place]) Continent Weight: (!) 193 kg Height:     BEHAVIORAL SYMPTOMS/MOOD NEUROLOGICAL BOWEL NUTRITION STATUS      Continent Diet (regualr with thin fluids)  AMBULATORY STATUS COMMUNICATION OF NEEDS Skin   Extensive Assist Verbally Normal                       Personal Care Assistance Level of Assistance  Bathing, Feeding, Dressing Bathing Assistance: Maximum assistance Feeding  assistance: Limited assistance Dressing Assistance: Maximum assistance     Functional Limitations Info  Sight, Hearing, Speech Sight Info: Adequate Hearing Info: Adequate Speech Info: Adequate    SPECIAL CARE FACTORS FREQUENCY  PT (By licensed PT), OT (By licensed OT)     PT Frequency: 5xper week OT Frequency: 5x per week            Contractures Contractures Info: Not present    Additional Factors Info  Code Status, Allergies Code Status Info: Full Allergies Info: Neurontin (Gabapentin)  Sulfa Antibiotics           Current Medications (02/09/2023):  This is the current hospital active medication list Current Facility-Administered Medications  Medication Dose Route Frequency Provider Last Rate Last Admin   acetaminophen (TYLENOL) tablet 1,000 mg  1,000 mg Oral Daily PRN Rhetta Mura, MD   1,000 mg at 02/09/23 0905   busPIRone (BUSPAR) tablet 5 mg  5 mg Oral TID Rhetta Mura, MD   5 mg at 02/09/23 1093   Chlorhexidine Gluconate Cloth 2 % PADS 6 each  6 each Topical Daily Rhetta Mura, MD   6 each at 02/08/23 1205   diclofenac Sodium (VOLTAREN) 1 % topical gel 2 g  2 g Topical QID PRN Rhetta Mura, MD   2 g at 02/09/23 0908   DULoxetine (CYMBALTA) DR capsule 120 mg  120 mg Oral Daily Rhetta Mura, MD   120 mg at 02/09/23 0905   gabapentin (NEURONTIN) capsule 100 mg  100 mg Oral QHS Rhetta Mura, MD  100 mg at 02/08/23 2153   levalbuterol (XOPENEX) nebulizer solution 0.63 mg  0.63 mg Nebulization Q8H PRN John Giovanni, MD   0.63 mg at 02/08/23 1357   Oral care mouth rinse  15 mL Mouth Rinse 4 times per day Rhetta Mura, MD   15 mL at 02/08/23 1206   Oral care mouth rinse  15 mL Mouth Rinse PRN Rhetta Mura, MD       traMADol Janean Sark) tablet 50 mg  50 mg Oral Q12H PRN Rhetta Mura, MD   50 mg at 02/09/23 6962     Discharge Medications: Please see discharge summary for a list of discharge  medications.  Relevant Imaging Results:  Relevant Lab Results:   Additional Information SSN [id number]  Lavenia Atlas, RN

## 2023-02-10 ENCOUNTER — Inpatient Hospital Stay (HOSPITAL_COMMUNITY): Payer: 59

## 2023-02-10 DIAGNOSIS — J9621 Acute and chronic respiratory failure with hypoxia: Secondary | ICD-10-CM | POA: Diagnosis not present

## 2023-02-10 DIAGNOSIS — J9622 Acute and chronic respiratory failure with hypercapnia: Secondary | ICD-10-CM | POA: Diagnosis not present

## 2023-02-10 LAB — COMPREHENSIVE METABOLIC PANEL
ALT: 11 U/L (ref 0–44)
AST: 10 U/L — ABNORMAL LOW (ref 15–41)
Albumin: 3.3 g/dL — ABNORMAL LOW (ref 3.5–5.0)
Alkaline Phosphatase: 61 U/L (ref 38–126)
Anion gap: 8 (ref 5–15)
BUN: 16 mg/dL (ref 8–23)
CO2: 33 mmol/L — ABNORMAL HIGH (ref 22–32)
Calcium: 10.1 mg/dL (ref 8.9–10.3)
Chloride: 95 mmol/L — ABNORMAL LOW (ref 98–111)
Creatinine, Ser: 0.97 mg/dL (ref 0.44–1.00)
GFR, Estimated: >60 mL/min (ref >60)
Glucose, Bld: 112 mg/dL — ABNORMAL HIGH (ref 70–99)
Potassium: 4.2 mmol/L (ref 3.5–5.1)
Sodium: 136 mmol/L (ref 135–145)
Total Bilirubin: 0.5 mg/dL (ref <1.2)
Total Protein: 6.8 g/dL (ref 6.5–8.1)

## 2023-02-10 LAB — CBC WITH DIFFERENTIAL/PLATELET
Abs Immature Granulocytes: 0.04 10*3/uL (ref 0.00–0.07)
Basophils Absolute: 0 10*3/uL (ref 0.0–0.1)
Basophils Relative: 0 %
Eosinophils Absolute: 0.2 10*3/uL (ref 0.0–0.5)
Eosinophils Relative: 2 %
HCT: 37.3 % (ref 36.0–46.0)
Hemoglobin: 10.9 g/dL — ABNORMAL LOW (ref 12.0–15.0)
Immature Granulocytes: 1 %
Lymphocytes Relative: 19 %
Lymphs Abs: 1.6 10*3/uL (ref 0.7–4.0)
MCH: 28.3 pg (ref 26.0–34.0)
MCHC: 29.2 g/dL — ABNORMAL LOW (ref 30.0–36.0)
MCV: 96.9 fL (ref 80.0–100.0)
Monocytes Absolute: 0.8 10*3/uL (ref 0.1–1.0)
Monocytes Relative: 9 %
Neutro Abs: 5.8 10*3/uL (ref 1.7–7.7)
Neutrophils Relative %: 69 %
Platelets: 265 10*3/uL (ref 150–400)
RBC: 3.85 MIL/uL — ABNORMAL LOW (ref 3.87–5.11)
RDW: 12.2 % (ref 11.5–15.5)
WBC: 8.5 10*3/uL (ref 4.0–10.5)
nRBC: 0 % (ref 0.0–0.2)

## 2023-02-10 MED ORDER — HYDROCHLOROTHIAZIDE 12.5 MG PO TABS
12.5000 mg | ORAL_TABLET | Freq: Every day | ORAL | Status: DC
  Administered 2023-02-10 – 2023-02-16 (×7): 12.5 mg via ORAL
  Filled 2023-02-10 (×7): qty 1

## 2023-02-10 MED ORDER — ALPRAZOLAM 0.5 MG PO TABS
1.0000 mg | ORAL_TABLET | Freq: Once | ORAL | Status: AC
  Administered 2023-02-10: 1 mg via ORAL
  Filled 2023-02-10: qty 2

## 2023-02-10 MED ORDER — METFORMIN HCL 500 MG PO TABS
500.0000 mg | ORAL_TABLET | Freq: Every day | ORAL | Status: DC
  Administered 2023-02-11 – 2023-02-13 (×3): 500 mg via ORAL
  Filled 2023-02-10 (×3): qty 1

## 2023-02-10 NOTE — Progress Notes (Signed)
   02/10/23 0433  BiPAP/CPAP/SIPAP  BiPAP/CPAP/SIPAP Pt Type Adult  BiPAP/CPAP/SIPAP V60  Mask Type Full face mask  Mask Size Large  Set Rate 14 breaths/min  Respiratory Rate 15 breaths/min  IPAP 14 cmH20  EPAP 8 cmH2O  FiO2 (%) 40 %  Minute Ventilation 11.3  Leak 12  Peak Inspiratory Pressure (PIP) 14  Tidal Volume (Vt) 557  Patient Home Equipment No  Auto Titrate No  Press High Alarm 30 cmH2O  Press Low Alarm 5 cmH2O  Oxygen Percent 40 %   Pt. remains on V60 BiPAP, sleeping in no distress.

## 2023-02-10 NOTE — Plan of Care (Signed)
  Problem: Safety: Goal: Ability to remain free from injury will improve Outcome: Progressing   Problem: Skin Integrity: Goal: Risk for impaired skin integrity will decrease Outcome: Progressing   Problem: Pain Management: Goal: General experience of comfort will improve Outcome: Progressing   Problem: Coping: Goal: Level of anxiety will decrease Outcome: Progressing   Problem: Nutrition: Goal: Adequate nutrition will be maintained Outcome: Progressing   Problem: Activity: Goal: Risk for activity intolerance will decrease Outcome: Progressing   Problem: Clinical Measurements: Goal: Ability to maintain clinical measurements within normal limits will improve Outcome: Progressing   Problem: Clinical Measurements: Goal: Respiratory complications will improve Outcome: Progressing   Problem: Education: Goal: Knowledge of General Education information will improve Description: Including pain rating scale, medication(s)/side effects and non-pharmacologic comfort measures Outcome: Progressing

## 2023-02-10 NOTE — Progress Notes (Signed)
HOSPITALIST ROUNDING NOTE Janice Ryan [id number]  DOB: 08/01/1955  DOA: 02/07/2023  PCP: [name], NP  02/10/2023,3:53 PM   LOS: 2 days      Code Status: full  From:  home     Current Dispo: snf     67 year old female, super morbid obesity weight 202 kg Restrictive + obstructive lung disease-chronic respiratory failure 2 to 3 L of oxygen at home at baseline--- supposed to be on BiPAP/trilogy vent-previous issues with obtaining this as uninsured Hypertension Depression ?  Pancreatic colonic inflammation 03/2019-also Mediastinal nodule 2.8 x 2.2 cm--- this was recommended for follow-up in the outpatient setting HTN Known osteoarthritis with prosthetic left knee Chronic lower extremity edema with recurrent cellulitis Also has chronic pain and is followed with pain medicine at [facility]--     Notes indicate fall February 05, 2023 at home-presented to ED and was treated for pain-she then became hypoxic sats dropped into the 60s she was slightly confused Because of confusion workup was pursued showing ABG with pCO2 70 pH 7.3 BNP 118 CT angio showed no pulmonary embolism, pulmonary hypertension no evidence   Plan  Acute hypoxic/hypercarbic respiratory failure on admission Chronic respiratory failure on 5 L at baseline---de-escalated to 3L--2/2 iatrogenic pain control in setting of   Much improved but mandatory ventilator with large size mask (was not able to use small size mask or find a mask that fit her at home)  Accidental fall with severe knee pain X-ray shows severe arthritis --severe pain with flexion--MRI pending of knee increase her gabapentin to 300 twice daily---prefer to not add more meds with potential for encephalopathy avoid tramadol as best possible, high-dose Tylenol 1000 every 8 as needed, Mobic 15 daily and use additive benefit of Cymbalta 124 some component to pain control follows with [facility] and had a left knee surgery previously at [facility]  several years ago Use capsaicin for topical pain control 20, may use diclofenac also for topical pain relief--has had some improvement with these  Restrictive and obstructive lung disease Slept well with trilogy vent-TOc to commen tto ensure has well fitting mask  Depression Continue BuSpar 5 3 times daily, doxepin 50 Cymbalta as above Do not restart the patient on Adderall , hold trazodone at night  Hypertension Blood pressure is not controlled-reinitiate lisinopril home dose 20 daily Add hydrochlorothiazide low dose  Super morbid obesity BMI >50 Life-threatening-she understands need for weight loss--risk benefit of off label metformin explained to patient---She was attempting to get onto [facility] or weight loss supplement She may benefit from weight loss surgery referral   DVT prophylaxis: Heparin  Status is: Inpatient Remains inpatient appropriate because:   Requires SNF awaiting auth    Subjective:  Well Sleeping well No distress long discussion re: Metformin/weight loss Will see if MRI shows any othe rpathology--has had steroid shot in Knee prior--might benefit form this again?   Objective + exam Vitals:   02/09/23 1630 02/09/23 2100 02/10/23 0524 02/10/23 0912  BP: (!) 155/71 126/66 (!) 161/83 (!) 168/79  Pulse: 88 82 85 86  Resp: 20  16 16   Temp: 99.3 F (37.4 C) 98.5 F (36.9 C) 97.7 F (36.5 C) 98.4 F (36.9 C)  TempSrc: Oral Oral Oral Oral  SpO2: 96% 91% 90% 93%  Weight:      Height: 5' 7.5" (1.715 m)      Filed Weights   02/08/23 1033  Weight: (!) 193 kg    Examination:  Obese pleasant black female no distress  CTAB no added sound but poor exam Abdomen nondistended no rebound ROM intact Able to flex hip some--knee flexion remains restrcited and painful on R side beyond 30 degrees  Data Reviewed: reviewed   CBC    Component Value Date/Time   WBC 8.5 02/10/2023 0446   RBC 3.85 (L) 02/10/2023 0446   HGB 10.9 (L) 02/10/2023 0446   HCT 37.3  02/10/2023 0446   PLT 265 02/10/2023 0446   MCV 96.9 02/10/2023 0446   MCH 28.3 02/10/2023 0446   MCHC 29.2 (L) 02/10/2023 0446   RDW 12.2 02/10/2023 0446   LYMPHSABS 1.6 02/10/2023 0446   MONOABS 0.8 02/10/2023 0446   EOSABS 0.2 02/10/2023 0446   BASOSABS 0.0 02/10/2023 0446      Latest Ref Rng & Units 02/10/2023    4:46 AM 02/09/2023    2:54 AM 02/07/2023    5:25 PM  CMP  Glucose 70 - 99 mg/dL 220  254  270   BUN 8 - 23 mg/dL 16  16  17    Creatinine 0.44 - 1.00 mg/dL 6.23  7.62  8.31   Sodium 135 - 145 mmol/L 136  136  139   Potassium 3.5 - 5.1 mmol/L 4.2  3.9  4.1   Chloride 98 - 111 mmol/L 95  101  99   CO2 22 - 32 mmol/L 33  29  32   Calcium 8.9 - 10.3 mg/dL 51.7  9.7  9.8   Total Protein 6.5 - 8.1 g/dL 6.8  6.8  7.2   Total Bilirubin <1.2 mg/dL 0.5  0.7  0.7   Alkaline Phos 38 - 126 U/L 61  62  72   AST 15 - 41 U/L 10  10  12    ALT 0 - 44 U/L 11  10  11       Scheduled Meds:  busPIRone  5 mg Oral TID   capsicum   Topical BID   Chlorhexidine Gluconate Cloth  6 each Topical Daily   doxepin  50 mg Oral Daily   DULoxetine  120 mg Oral Daily   enoxaparin (LOVENOX) injection  100 mg Subcutaneous Daily   gabapentin  300 mg Oral QHS   Gerhardt's butt cream   Topical BID   lisinopril  20 mg Oral Daily   meloxicam  15 mg Oral Daily   mouth rinse  15 mL Mouth Rinse 4 times per day   Continuous Infusions:  Time  25  Rhetta Mura, MD  [facility]

## 2023-02-10 NOTE — TOC Progression Note (Addendum)
Transition of Care [facility]) - Progression Note    Patient Details  Name: Janice Ryan MRN: [id number] Date of Birth: 01-26-1956  [facility] CM/SW Contact  Georgie Chard, LCSW Phone Number: 02/10/2023, 10:38 AM  Clinical Narrative:    CSW received [place] from [place] from [place], at this time [facility] has reported that they do not have the staff or equipment for patient's weight size. This CSW has attempted to talk to patient in regards to choosing another facility. CSW went to patient's room however, patient is sleeping at this time. CSW has informed the patient's nurse that when she wakes up to inform CSW so that CSW can present the other bed offers to the patient. At this time TOC will continue to follow.    Expected Discharge Plan: Skilled Nursing Facility Barriers to Discharge: Continued Medical Work up  Expected Discharge Plan and Services In-house Referral: NA Discharge Planning Services: CM Consult Post Acute Care Choice: NA Living arrangements for the past 2 months: Apartment                 DME Arranged: N/A DME Agency: NA       HH Arranged: NA HH Agency: NA         Social Determinants of Health (SDOH) Interventions SDOH Screenings   Food Insecurity: No Food Insecurity (02/09/2023)  Housing: Low Risk  (02/09/2023)  Transportation Needs: Unmet Transportation Needs (02/09/2023)  Utilities: Not At Risk (02/09/2023)  Depression (PHQ2-9): Low Risk  (07/06/2021)  Social Connections: Unknown (10/10/2022)   Received from [facility], [facility]  Tobacco Use: Low Risk  (02/08/2023)    Readmission Risk Interventions    02/08/2023    4:56 PM  Readmission Risk Prevention Plan  Post Dischage Appt Complete  Medication Screening Complete  Transportation Screening Complete

## 2023-02-10 NOTE — TOC Progression Note (Signed)
Transition of Care [facility]) - Progression Note    Patient Details  Name: Janice Ryan MRN: [id number] Date of Birth: [birth year]  Transition of Care [facility]) CM/SW Contact  Georgie Chard, LCSW Phone Number: 02/10/2023, 1:39 PM  Clinical Narrative:    CSW spoke to the patient about bed offers. At this time patient wants to take time to review. Patient is pending MRI at this time. CSW will follow up with patient in regards to bed offers to continue SNF process.    Expected Discharge Plan: Skilled Nursing Facility Barriers to Discharge: Continued Medical Work up  Expected Discharge Plan and Services In-house Referral: NA Discharge Planning Services: CM Consult Post Acute Care Choice: NA Living arrangements for the past 2 months: Apartment                 DME Arranged: N/A DME Agency: NA       HH Arranged: NA HH Agency: NA         Social Determinants of Health (SDOH) Interventions SDOH Screenings   Food Insecurity: No Food Insecurity (02/09/2023)  Housing: Low Risk  (02/09/2023)  Transportation Needs: Unmet Transportation Needs (02/09/2023)  Utilities: Not At Risk (02/09/2023)  Depression (PHQ2-9): Low Risk  (07/06/2021)  Social Connections: Unknown (10/10/2022)   Received from [facility], [facility]  Tobacco Use: Low Risk  (02/08/2023)    Readmission Risk Interventions    02/08/2023    4:56 PM  Readmission Risk Prevention Plan  Post Dischage Appt Complete  Medication Screening Complete  Transportation Screening Complete

## 2023-02-11 DIAGNOSIS — J9622 Acute and chronic respiratory failure with hypercapnia: Secondary | ICD-10-CM | POA: Diagnosis not present

## 2023-02-11 DIAGNOSIS — J9621 Acute and chronic respiratory failure with hypoxia: Secondary | ICD-10-CM | POA: Diagnosis not present

## 2023-02-11 LAB — COMPREHENSIVE METABOLIC PANEL
ALT: 9 U/L (ref 0–44)
AST: 11 U/L — ABNORMAL LOW (ref 15–41)
Albumin: 3.2 g/dL — ABNORMAL LOW (ref 3.5–5.0)
Alkaline Phosphatase: 60 U/L (ref 38–126)
Anion gap: 9 (ref 5–15)
BUN: 21 mg/dL (ref 8–23)
CO2: 31 mmol/L (ref 22–32)
Calcium: 11 mg/dL — ABNORMAL HIGH (ref 8.9–10.3)
Chloride: 97 mmol/L — ABNORMAL LOW (ref 98–111)
Creatinine, Ser: 1.34 mg/dL — ABNORMAL HIGH (ref 0.44–1.00)
GFR, Estimated: 44 mL/min — ABNORMAL LOW (ref >60)
Glucose, Bld: 114 mg/dL — ABNORMAL HIGH (ref 70–99)
Potassium: 4.6 mmol/L (ref 3.5–5.1)
Sodium: 137 mmol/L (ref 135–145)
Total Bilirubin: 0.4 mg/dL (ref <1.2)
Total Protein: 6.7 g/dL (ref 6.5–8.1)

## 2023-02-11 LAB — CBC WITH DIFFERENTIAL/PLATELET
Abs Immature Granulocytes: 0.08 10*3/uL — ABNORMAL HIGH (ref 0.00–0.07)
Basophils Absolute: 0.1 10*3/uL (ref 0.0–0.1)
Basophils Relative: 1 %
Eosinophils Absolute: 0.2 10*3/uL (ref 0.0–0.5)
Eosinophils Relative: 2 %
HCT: 39.3 % (ref 36.0–46.0)
Hemoglobin: 11.5 g/dL — ABNORMAL LOW (ref 12.0–15.0)
Immature Granulocytes: 1 %
Lymphocytes Relative: 20 %
Lymphs Abs: 2.2 10*3/uL (ref 0.7–4.0)
MCH: 28.7 pg (ref 26.0–34.0)
MCHC: 29.3 g/dL — ABNORMAL LOW (ref 30.0–36.0)
MCV: 98 fL (ref 80.0–100.0)
Monocytes Absolute: 1.1 10*3/uL — ABNORMAL HIGH (ref 0.1–1.0)
Monocytes Relative: 10 %
Neutro Abs: 7.3 10*3/uL (ref 1.7–7.7)
Neutrophils Relative %: 66 %
Platelets: 276 10*3/uL (ref 150–400)
RBC: 4.01 MIL/uL (ref 3.87–5.11)
RDW: 12.3 % (ref 11.5–15.5)
WBC: 10.9 10*3/uL — ABNORMAL HIGH (ref 4.0–10.5)
nRBC: 0 % (ref 0.0–0.2)

## 2023-02-11 NOTE — Plan of Care (Signed)
  Problem: Education: Goal: Knowledge of General Education information will improve Description: Including pain rating scale, medication(s)/side effects and non-pharmacologic comfort measures Outcome: Progressing   Problem: Health Behavior/Discharge Planning: Goal: Ability to manage health-related needs will improve Outcome: Progressing   Problem: Clinical Measurements: Goal: Diagnostic test results will improve 02/11/2023 1251 by Dwyane Dee, RN Outcome: Progressing 02/11/2023 0824 by Dwyane Dee, RN Outcome: Progressing Goal: Respiratory complications will improve 02/11/2023 1251 by Dwyane Dee, RN Outcome: Progressing 02/11/2023 0824 by Dwyane Dee, RN Outcome: Progressing Goal: Cardiovascular complication will be avoided Outcome: Progressing   Problem: Activity: Goal: Risk for activity intolerance will decrease 02/11/2023 1251 by Dwyane Dee, RN Outcome: Progressing 02/11/2023 0824 by Dwyane Dee, RN Outcome: Progressing   Problem: Pain Management: Goal: General experience of comfort will improve 02/11/2023 1251 by Dwyane Dee, RN Outcome: Progressing 02/11/2023 0824 by Dwyane Dee, RN Outcome: Progressing   Problem: Safety: Goal: Ability to remain free from injury will improve 02/11/2023 1251 by Dwyane Dee, RN Outcome: Progressing 02/11/2023 0824 by Dwyane Dee, RN Outcome: Progressing

## 2023-02-11 NOTE — Plan of Care (Signed)
  Problem: Education: Goal: Knowledge of General Education information will improve Description: Including pain rating scale, medication(s)/side effects and non-pharmacologic comfort measures Outcome: Progressing   Problem: Clinical Measurements: Goal: Diagnostic test results will improve Outcome: Progressing Goal: Respiratory complications will improve Outcome: Progressing Goal: Cardiovascular complication will be avoided Outcome: Progressing   Problem: Activity: Goal: Risk for activity intolerance will decrease Outcome: Progressing   Problem: Pain Management: Goal: General experience of comfort will improve Outcome: Progressing   Problem: Safety: Goal: Ability to remain free from injury will improve Outcome: Progressing

## 2023-02-11 NOTE — TOC Progression Note (Addendum)
Transition of Care [facility]) - Progression Note    Patient Details  Name: Janice Ryan MRN: [id number] Date of Birth: 12-29-1955  [facility]) CM/SW Contact  Georgie Chard, LCSW Phone Number: 02/11/2023, 2:59 PM  Clinical Narrative:    CSW spoke to [place] at [facility] at this time [facility] does not have a bed. This CSW has re-faxed the patient out to more SNF's in a effort for more bed offers. At this time barrier for patient is weight. TOC will continue to follow.   Addend@ 4:01 pm  CSW has reached out to [facility] to see if they can review patient. CSW discussed with patient that [facility] has no current beds. Patient is aware that CSW has re-faxed her out for more offers. TOC will continue to follow for DC plan.    Expected Discharge Plan: Skilled Nursing Facility Barriers to Discharge: Continued Medical Work up  Expected Discharge Plan and Services In-house Referral: NA Discharge Planning Services: CM Consult Post Acute Care Choice: NA Living arrangements for the past 2 months: Apartment                 DME Arranged: N/A DME Agency: NA       HH Arranged: NA HH Agency: NA         Social Determinants of Health (SDOH) Interventions SDOH Screenings   Food Insecurity: No Food Insecurity (02/09/2023)  Housing: Low Risk  (02/09/2023)  Transportation Needs: Unmet Transportation Needs (02/09/2023)  Utilities: Not At Risk (02/09/2023)  Depression (PHQ2-9): Low Risk  (07/06/2021)  Social Connections: Unknown (10/10/2022)   Received from [facility], [facility]  Tobacco Use: Low Risk  (02/08/2023)    Readmission Risk Interventions    02/08/2023    4:56 PM  Readmission Risk Prevention Plan  Post Dischage Appt Complete  Medication Screening Complete  Transportation Screening Complete

## 2023-02-11 NOTE — Progress Notes (Signed)
HOSPITALIST ROUNDING NOTE Ettamae Shepperd [id number]  DOB: 26-May-1955  DOA: 02/07/2023  PCP: [name], NP  02/11/2023,2:51 PM   LOS: 3 days      Code Status: full  From:  home     Current Dispo: snf     67 year old female, super morbid obesity weight 202 kg Restrictive + obstructive lung disease-chronic respiratory failure 2 to 3 L of oxygen at home at baseline--- supposed to be on BiPAP/trilogy vent-previous issues with obtaining this as uninsured Hypertension Depression ?  Pancreatic colonic inflammation 03/2019-also Mediastinal nodule 2.8 x 2.2 cm--- this was recommended for follow-up in the outpatient setting HTN Known osteoarthritis with prosthetic left knee Chronic lower extremity edema with recurrent cellulitis Also has chronic pain and is followed with pain medicine at [facility]--     Notes indicate fall February 05, 2023 at home-presented to ED and was treated for pain-she then became hypoxic sats dropped into the 60s she was slightly confused Because of confusion workup was pursued showing ABG with pCO2 70 pH 7.3 BNP 118 CT angio showed no pulmonary embolism, pulmonary hypertension no evidence  11/10-Mr kne shows severe rupture MCL   Plan  Acute hypoxic/hypercarbic respiratory failure on admission Chronic respiratory failure on 5 L at baseline---de-escalated to 3L--2/2 iatrogenic pain control in sthe ED Much improved --mandatory ventilator with large size mask (was not able to use small size mask or find a mask that fit her at home) [facility] made aware of need for equipment at d/c  Accidental fall with severe knee pain R left knee surgery previously at [facility] several years ago X-ray shows severe arthritis --severe pain with flexion--MRI shows severe avulsion R knee MCL--d/r [name] Knee immobilizer with mobility, f/u OP [facility] with Dr. [name] 10-14 d for definitive management increase her gabapentin to 300 twice daily---prefer to not add  more meds with potential for encephalopathy avoid tramadol, Tylenol 1000 every 8 as needed, Mobic 15 daily and use  Cymbalta  capsaicin for topical pain control 20, diclofenac also for topical pain relief--mild improvement with these  Restrictive and obstructive lung disease Slept well with trilogy vent-TOC to comment  Depression Continue BuSpar 5 3 times daily, doxepin 50 Cymbalta as above Do not restart the patient on Adderall , hold trazodone at night  Hypertension Blood pressure is not controlled-reinitiate lisinopril home dose 20 daily Add hydrochlorothiazide 12.5 Follow labs  Super morbid obesity BMI >50 Life-threatening-she understands need for weight loss--risk benefit of off label metformin explained to patient and started 500 every day ---She was attempting to get onto [facility] or weight loss supplement She may benefit from weight loss surgery referral as OP   DVT prophylaxis: Heparin  Status is: Inpatient Remains inpatient appropriate because:   Requires SNF awaiting auth    Subjective:  Overall well no distress No fever chills n/v Pain manageable   Objective + exam Vitals:   02/11/23 0331 02/11/23 0920 02/11/23 1135 02/11/23 1241  BP:  (!) 120/43 (!) 153/63   Pulse:  84 85   Resp: 15 18 16    Temp:  98.9 F (37.2 C) 98 F (36.7 C)   TempSrc:  Oral Oral   SpO2:  96% 96% 94%  Weight:      Height:       Filed Weights   02/08/23 1033  Weight: (!) 193 kg    Examination:  pleasant black female no distress CTAB no added sound but poor exam Abdomen nondistended no rebound ROM intact Manual  muscle testing deferred today  Data Reviewed: reviewed   CBC    Component Value Date/Time   WBC 10.9 (H) 02/11/2023 0419   RBC 4.01 02/11/2023 0419   HGB 11.5 (L) 02/11/2023 0419   HCT 39.3 02/11/2023 0419   PLT 276 02/11/2023 0419   MCV 98.0 02/11/2023 0419   MCH 28.7 02/11/2023 0419   MCHC 29.3 (L) 02/11/2023 0419   RDW 12.3 02/11/2023 0419    LYMPHSABS 2.2 02/11/2023 0419   MONOABS 1.1 (H) 02/11/2023 0419   EOSABS 0.2 02/11/2023 0419   BASOSABS 0.1 02/11/2023 0419      Latest Ref Rng & Units 02/11/2023    4:19 AM 02/10/2023    4:46 AM 02/09/2023    2:54 AM  CMP  Glucose 70 - 99 mg/dL 409  811  914   BUN 8 - 23 mg/dL 21  16  16    Creatinine 0.44 - 1.00 mg/dL 7.82  9.56  2.13   Sodium 135 - 145 mmol/L 137  136  136   Potassium 3.5 - 5.1 mmol/L 4.6  4.2  3.9   Chloride 98 - 111 mmol/L 97  95  101   CO2 22 - 32 mmol/L 31  33  29   Calcium 8.9 - 10.3 mg/dL 08.6  57.8  9.7   Total Protein 6.5 - 8.1 g/dL 6.7  6.8  6.8   Total Bilirubin <1.2 mg/dL 0.4  0.5  0.7   Alkaline Phos 38 - 126 U/L 60  61  62   AST 15 - 41 U/L 11  10  10    ALT 0 - 44 U/L 9  11  10       Scheduled Meds:  busPIRone  5 mg Oral TID   capsicum   Topical BID   Chlorhexidine Gluconate Cloth  6 each Topical Daily   doxepin  50 mg Oral Daily   DULoxetine  120 mg Oral Daily   enoxaparin (LOVENOX) injection  100 mg Subcutaneous Daily   gabapentin  300 mg Oral QHS   Gerhardt's butt cream   Topical BID   hydrochlorothiazide  12.5 mg Oral Daily   lisinopril  20 mg Oral Daily   meloxicam  15 mg Oral Daily   metFORMIN  500 mg Oral Q breakfast   mouth rinse  15 mL Mouth Rinse 4 times per day   Continuous Infusions:  Time  30  Rhetta Mura, MD  [facility]

## 2023-02-11 NOTE — Progress Notes (Signed)
Orthopedic Tech Progress Note Patient Details:  Janice Ryan [birth year] [id number]  Ortho Devices Type of Ortho Device: Knee Immobilizer Ortho Device/Splint Location: RLE Ortho Device/Splint Interventions: Ordered, Application   Post Interventions Patient Tolerated: Well Instructions Provided: Adjustment of device  Tonye Pearson 02/11/2023, 2:59 PM

## 2023-02-12 DIAGNOSIS — J9621 Acute and chronic respiratory failure with hypoxia: Secondary | ICD-10-CM | POA: Diagnosis not present

## 2023-02-12 DIAGNOSIS — J9622 Acute and chronic respiratory failure with hypercapnia: Secondary | ICD-10-CM | POA: Diagnosis not present

## 2023-02-12 MED ORDER — ALBUTEROL SULFATE HFA 108 (90 BASE) MCG/ACT IN AERS: 2.0000 | INHALATION_SPRAY | Freq: Four times a day (QID) | RESPIRATORY_TRACT | Status: DC | PRN

## 2023-02-12 MED ORDER — POLYVINYL ALCOHOL 1.4 % OP SOLN
1.0000 [drp] | OPHTHALMIC | Status: DC | PRN
  Administered 2023-02-12: 1 [drp] via OPHTHALMIC
  Filled 2023-02-12: qty 15

## 2023-02-12 MED ORDER — MOMETASONE FURO-FORMOTEROL FUM 200-5 MCG/ACT IN AERO
2.0000 | INHALATION_SPRAY | Freq: Two times a day (BID) | RESPIRATORY_TRACT | Status: DC
  Administered 2023-02-12 – 2023-02-16 (×9): 2 via RESPIRATORY_TRACT
  Filled 2023-02-12: qty 8.8

## 2023-02-12 MED ORDER — ALBUTEROL SULFATE (2.5 MG/3ML) 0.083% IN NEBU
2.5000 mg | INHALATION_SOLUTION | Freq: Four times a day (QID) | RESPIRATORY_TRACT | Status: DC | PRN
  Administered 2023-02-13: 2.5 mg via RESPIRATORY_TRACT
  Filled 2023-02-12: qty 3

## 2023-02-12 MED ORDER — MONTELUKAST SODIUM 10 MG PO TABS
10.0000 mg | ORAL_TABLET | Freq: Every day | ORAL | Status: DC
Start: 2023-02-12 — End: 2023-02-17
  Administered 2023-02-12 – 2023-02-15 (×4): 10 mg via ORAL
  Filled 2023-02-12 (×4): qty 1

## 2023-02-12 MED ORDER — TIZANIDINE HCL 4 MG PO TABS
2.0000 mg | ORAL_TABLET | Freq: Once | ORAL | Status: AC
  Administered 2023-02-12: 2 mg via ORAL
  Filled 2023-02-12: qty 1

## 2023-02-12 NOTE — Progress Notes (Signed)
   02/12/23 2232  BiPAP/CPAP/SIPAP  BiPAP/CPAP/SIPAP Pt Type Adult (Pt. wanting to have drink prior to having BiPAP placed, RN aware, RT to follow.)

## 2023-02-12 NOTE — Progress Notes (Signed)
Occupational Therapy Treatment Patient Details Name: Inta Suski MRN: [id number] DOB: 1956-02-24 Today's Date: 02/12/2023   History of present illness patient is a 67 year old female who presented 11/6 after a fall at home in bathtub. patient's imaging was negative for fracture but noted to have cervical, thoracic and lumbar spondylosis in imaging. 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  PMH: anxiety, chronic right knee pain, chronic hypercarbic respiratory failure.   OT comments  Patient was lethargic during session and declining to wear KI. Patient with less A for rolling at bed level with patient noted to be wet upon entrance to room. Education provided on importance of hygiene and keeping skin dry to prevent skin breakdown. Patient verbalized understanding. Patient was able to pull up off back of bed with bed rails with CGA with increased time x3 during session. Patient will benefit from continued inpatient follow up therapy, <3 hours/day.       If plan is discharge home, recommend the following:  Two people to help with walking and/or transfers;Assistance with cooking/housework;Two people to help with bathing/dressing/bathroom;Direct supervision/assist for medications management;Assist for transportation;Help with stairs or ramp for entrance;Direct supervision/assist for financial management   Equipment Recommendations  None recommended by OT       Precautions / Restrictions Precautions Precautions: Fall Precaution Comments: monitor O2 on chronic O2 at home Required Braces or Orthoses: Knee Immobilizer - Right Restrictions Weight Bearing  Restrictions: No Other Position/Activity Restrictions: patient declined use of KI at this time.       Mobility Bed Mobility Overal bed mobility: Needs Assistance Bed Mobility: Rolling Rolling: Max assist, +2 for physical assistance, +2 for safety/equipment         General bed mobility comments: assist with each leg to facilitate rollingm Able to tolerate left resting on bed rail after rolling. Able to pull self to sitting upright using bil. rails, performed x 3             ADL either performed or assessed with clinical judgement   ADL Overall ADL's : Needs assistance/impaired Eating/Feeding: Set up;Bed level Eating/Feeding Details (indicate cue type and reason): with increased time. patient requesting that food be placed in cups to eat from to prevent spillage.                       Toilet Transfer Details (indicate cue type and reason): unable to progress to EOB with level of lethargy Toileting- Clothing Manipulation and Hygiene: Total assistance;Bed level Toileting - Clothing Manipulation Details (indicate cue type and reason): to roll to each side for hygiene tasks with with increased time.              Cognition Arousal: Lethargic Behavior During Therapy: WFL for tasks assessed/performed Overall Cognitive Status: Within Functional Limits for tasks assessed                                 General Comments: AROUSED WITH MAX STIMULI                   Pertinent Vitals/ Pain       Pain Assessment Pain Assessment: Faces  Pain Score: 8  Pain Location: right  knee, ALL OVER Pain Descriptors / Indicators: Constant Pain Intervention(s): Limited activity within patient's tolerance, Monitored during session         Frequency  Min 1X/week        Progress Toward Goals  OT Goals(current goals can now be found in the care plan section)  Progress towards OT goals: Progressing toward goals     Plan      Co-evaluation       Reason for Co-Treatment: For patient/therapist safety;To address functional/ADL transfers PT goals addressed during session: Mobility/safety with mobility OT goals addressed during session: ADL's and self-care      AM-PAC OT "6 Clicks" Daily Activity     Outcome Measure   Help from another person eating meals?: A Little Help from another person taking care of personal grooming?: A Lot Help from another person toileting, which includes using toliet, bedpan, or urinal?: Total Help from another person bathing (including washing, rinsing, drying)?: Total Help from another person to put on and taking off regular upper body clothing?: A Lot Help from another person to put on and taking off regular lower body clothing?: Total 6 Click Score: 10    End of Session    OT Visit Diagnosis: Unsteadiness on feet (R26.81);Other abnormalities of gait and mobility (R26.89);Muscle weakness (generalized) (M62.81);Pain   Activity Tolerance Patient limited by pain   Patient Left in bed;with call bell/phone within reach;with nursing/sitter in room   Nurse Communication Mobility status        Time: 1404-1430 OT Time Calculation (min): 26 min  Charges: OT General Charges $OT Visit: 1 Visit OT Treatments $Self Care/Home Management : 8-22 mins  Rosalio Loud, MS Acute Rehabilitation Department Office# [phone]   Selinda Flavin 02/12/2023, 5:14 PM

## 2023-02-12 NOTE — Progress Notes (Signed)
Physical Therapy Treatment Patient Details Name: Janice Ryan MRN: [id number] DOB: 01-24-1956 Today's Date: 02/12/2023   History of Present Illness patient is a 67 year old female who presented 11/6 after a fall at home in bathtub. patient's imaging was negative for fracture but noted to have cervical, thoracic and lumbar spondylosis in imaging. 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  PMH: anxiety, chronic right knee pain, chronic hypercarbic respiratory failure.    PT Comments  Patient sleeping deeply , on [place] not on BiPAP. Strong stimulation to arouse  patient.  Patient  participated in rolling  in bed. Patient used rails to pull self to sitting upright in the bed x 3 .  Continue PT, Patient  now must wear R  KI when mobilizing OOB.    If plan is discharge home, recommend the following: Two people to help with walking and/or transfers;Two people to help with bathing/dressing/bathroom;Assistance with cooking/housework;Assist for transportation;Help with stairs or ramp for entrance   Can travel by private vehicle        Equipment Recommendations  None recommended by PT    Recommendations for Other Services       Precautions / Restrictions Precautions Precautions: Fall Precaution Comments: monitor O2 on chronic O2 at home Required Braces or Orthoses: Knee Immobilizer - Right Restrictions Weight Bearing Restrictions: No Other Position/Activity Restrictions: patient declined use of KI at this time.     Mobility  Bed Mobility Overal bed mobility: Needs Assistance Bed Mobility: Rolling Rolling: Max assist, +2 for physical assistance, +2 for  safety/equipment         General bed mobility comments: assist with  each leg to facilitate rollingm Able to tolerate lef rsting on bed rail after rolling. Able to pull self to sitting upright using bil. rails, performed x 3    Transfers                        Ambulation/Gait                   Stairs             Wheelchair Mobility     Tilt Bed    Modified Rankin (Stroke Patients Only)       Balance Overall balance assessment: Needs assistance Sitting-balance support: Bilateral upper extremity supported Sitting balance-Leahy Scale: Fair Sitting balance - Comments: pulling forward  in bed                                    Cognition Arousal: Lethargic Behavior During Therapy: WFL for tasks assessed/performed Overall Cognitive Status: Within Functional Limits for tasks assessed                                 General Comments: AROUSED WITH MAX STIMULI        Exercises      General Comments        Pertinent Vitals/Pain Pain Assessment Pain Assessment: Faces Pain Score: 8  Pain Location: right  knee, ALL OVER Pain Descriptors / Indicators: Constant Pain  Intervention(s): Limited activity within patient's tolerance    Home Living                          Prior Function            PT Goals (current goals can now be found in the care plan section) Progress towards PT goals: Progressing toward goals    Frequency    Min 1X/week      PT Plan      Co-evaluation PT/OT/SLP Co-Evaluation/Treatment: Yes Reason for Co-Treatment: For patient/therapist safety;To address functional/ADL transfers PT goals addressed during session: Mobility/safety with mobility OT goals addressed during session: ADL's and self-care      AM-PAC PT "6 Clicks" Mobility   Outcome Measure  Help needed turning from your back to your side while in a flat bed without using bedrails?: Total Help needed moving  from lying on your back to sitting on the side of a flat bed without using bedrails?: Total Help needed moving to and from a bed to a chair (including a wheelchair)?: Total Help needed standing up from a chair using your arms (e.g., wheelchair or bedside chair)?: Total Help needed to walk in hospital room?: Total Help needed climbing 3-5 steps with a railing? : Total 6 Click Score: 6    End of Session Equipment Utilized During Treatment: Oxygen Activity Tolerance: Patient tolerated treatment well Patient left: in bed;with call bell/phone within reach Nurse Communication: Mobility status;Need for lift equipment PT Visit Diagnosis: Unsteadiness on feet (R26.81);Other abnormalities of gait and mobility (R26.89);Repeated falls (R29.6);Muscle weakness (generalized) (M62.81);Difficulty in walking, not elsewhere classified (R26.2);Pain Pain - Right/Left: Right Pain - part of body: Knee;Leg     Time: 6440-3474 PT Time Calculation (min) (ACUTE ONLY): 29 min  Charges:    $Therapeutic Activity: 8-22 mins PT General Charges $$ ACUTE PT VISIT: 1 Visit                     Blanchard Kelch PT Acute Rehabilitation Services Office [phone] Weekend [phone]    Rada Hay 02/12/2023, 2:55 PM

## 2023-02-12 NOTE — Progress Notes (Signed)
HOSPITALIST ROUNDING NOTE Janice Ryan WUJ:[id number]  DOB: 08/23/1955  DOA: 02/07/2023  PCP: [name], NP  02/12/2023,4:14 PM   LOS: 4 days      Code Status: full  From:  home     Current Dispo: snf     67 year old female, super morbid obesity weight 202 kg Restrictive + obstructive lung disease-chronic respiratory failure 2 to 3 L of oxygen at home at baseline--- supposed to be on BiPAP/trilogy vent-previous issues with obtaining this as uninsured Hypertension Depression ?  Pancreatic colonic inflammation 03/2019-also Mediastinal nodule 2.8 x 2.2 cm--- this was recommended for follow-up in the outpatient setting HTN Known osteoarthritis with prosthetic left knee Chronic lower extremity edema with recurrent cellulitis Also has chronic pain and is followed with pain medicine at [facility]--     Notes indicate fall February 05, 2023 at home-presented to ED and was treated for pain-she then became hypoxic sats dropped into the 60s she was slightly confused Because of confusion workup was pursued showing ABG with pCO2 70 pH 7.3 BNP 118 CT angio showed no pulmonary embolism, pulmonary hypertension no evidence  11/10-Mr knee shows severe rupture MCL   Plan  Acute hypoxic/hypercarbic respiratory failure on admission Chronic respiratory failure on 5 L at baseline--was 2/2 iatrogenic pain control in sthe ED Much improved --mandatory ventilator with large size mask  when asleep(was not able to use small size mask or find a mask that fit her at home) [facility] made aware of need for equipment at d/c  Accidental fall with severe knee pain R left knee surgery previously at [facility] several years ago X-ray shows severe arthritis --severe pain with flexion--MRI shows severe avulsion R knee MCL--d/r [name] Knee immobilizer with mobility, f/u OP [facility] with Dr. [name] 10-14 d for definitive management increased her gabapentin to 300 twice daily---avoid meds with  potential for encephalopathy avoid tramadol, --use Tylenol 1000 every 8 as needed, Mobic 15 daily and use  Cymbalta  Capsaicin + diclo also for topical pain relief--mild improvement with these  Restrictive and obstructive lung disease Slept well with trilogy vent-TOC to comment  Depression Continue BuSpar 5 3 times daily, doxepin 50 Cymbalta as above Do not restart the patient on Adderall , hold trazodone at night  Hypertension Blood pressure is not controlled-reinitiate lisinopril home dose 20 daily Add hydrochlorothiazide 12.5 Follow periodic labs  Super morbid obesity BMI >50 Life-threatening-she understands need for weight loss--risk benefit of off label metformin explained to patient and started 500 every day ---She was attempting to get onto [facility] or weight loss supplement She may benefit from weight loss surgery referral as OP   DVT prophylaxis: Heparin  Status is: Inpatient Remains inpatient appropriate because:   Requires SNF awaiting auth    Subjective:  Sleepy--not wearing mask Tol diet Pain moderate Encouraged to work with PT and use immobilizer to ambulate   Objective + exam Vitals:   02/12/23 0146 02/12/23 0425 02/12/23 0508 02/12/23 0845  BP:   (!) 137/54   Pulse:   69   Resp: 17 14 20 16   Temp:   97.9 F (36.6 C)   TempSrc:   Oral   SpO2:   95% 96%  Weight:      Height:       Filed Weights   02/08/23 1033  Weight: (!) 193 kg    Examination:  pleasant no distress CTAB no added sound but poor exam Abdomen nondistended no rebound ROM intact Manual muscle testing deferred today,  no overt new changes  Data Reviewed: reviewed   CBC    Component Value Date/Time   WBC 10.9 (H) 02/11/2023 0419   RBC 4.01 02/11/2023 0419   HGB 11.5 (L) 02/11/2023 0419   HCT 39.3 02/11/2023 0419   PLT 276 02/11/2023 0419   MCV 98.0 02/11/2023 0419   MCH 28.7 02/11/2023 0419   MCHC 29.3 (L) 02/11/2023 0419   RDW 12.3 02/11/2023 0419   LYMPHSABS 2.2  02/11/2023 0419   MONOABS 1.1 (H) 02/11/2023 0419   EOSABS 0.2 02/11/2023 0419   BASOSABS 0.1 02/11/2023 0419      Latest Ref Rng & Units 02/11/2023    4:19 AM 02/10/2023    4:46 AM 02/09/2023    2:54 AM  CMP  Glucose 70 - 99 mg/dL 161  096  045   BUN 8 - 23 mg/dL 21  16  16    Creatinine 0.44 - 1.00 mg/dL 4.09  8.11  9.14   Sodium 135 - 145 mmol/L 137  136  136   Potassium 3.5 - 5.1 mmol/L 4.6  4.2  3.9   Chloride 98 - 111 mmol/L 97  95  101   CO2 22 - 32 mmol/L 31  33  29   Calcium 8.9 - 10.3 mg/dL 78.2  95.6  9.7   Total Protein 6.5 - 8.1 g/dL 6.7  6.8  6.8   Total Bilirubin <1.2 mg/dL 0.4  0.5  0.7   Alkaline Phos 38 - 126 U/L 60  61  62   AST 15 - 41 U/L 11  10  10    ALT 0 - 44 U/L 9  11  10       Scheduled Meds:  busPIRone  5 mg Oral TID   capsicum   Topical BID   doxepin  50 mg Oral Daily   DULoxetine  120 mg Oral Daily   enoxaparin (LOVENOX) injection  100 mg Subcutaneous Daily   gabapentin  300 mg Oral QHS   Gerhardt's butt cream   Topical BID   hydrochlorothiazide  12.5 mg Oral Daily   lisinopril  20 mg Oral Daily   meloxicam  15 mg Oral Daily   metFORMIN  500 mg Oral Q breakfast   mometasone-formoterol  2 puff Inhalation BID   montelukast  10 mg Oral QHS   mouth rinse  15 mL Mouth Rinse 4 times per day   Continuous Infusions:  Time  20  Janice Mura, MD  [facility]

## 2023-02-12 NOTE — TOC Progression Note (Addendum)
Transition of Care [facility]) - Progression Note    Patient Details  Name: Janice Ryan MRN: [id number] Date of Birth: 03-Dec-1955  Transition of [facility] CM/SW Contact  Larrie Kass, LCSW Phone Number: 02/12/2023, 10:58 AM  Clinical Narrative:    CSW spoke with [place] with [facility]. She is currently reviewing pt for placement. At this time pt has no bed offers. TOC to follow.    11:11am CSW faxed pt out to 15 additional facilities TOC to follow.   Expected Discharge Plan: Skilled Nursing Facility Barriers to Discharge: Continued Medical Work up  Expected Discharge Plan and Services In-house Referral: NA Discharge Planning Services: CM Consult Post Acute Care Choice: NA Living arrangements for the past 2 months: Apartment                 DME Arranged: N/A DME Agency: NA       HH Arranged: NA HH Agency: NA         Social Determinants of Health (SDOH) Interventions SDOH Screenings   Food Insecurity: No Food Insecurity (02/09/2023)  Housing: Low Risk  (02/09/2023)  Transportation Needs: Unmet Transportation Needs (02/09/2023)  Utilities: Not At Risk (02/09/2023)  Depression (PHQ2-9): Low Risk  (07/06/2021)  Social Connections: Unknown (10/10/2022)   Received from [facility]  Tobacco Use: Low Risk  (02/08/2023)    Readmission Risk Interventions    02/08/2023    4:56 PM  Readmission Risk Prevention Plan  Post Dischage Appt Complete  Medication Screening Complete  Transportation Screening Complete

## 2023-02-13 ENCOUNTER — Telehealth: Payer: Self-pay | Admitting: Family

## 2023-02-13 ENCOUNTER — Ambulatory Visit: Payer: Self-pay

## 2023-02-13 DIAGNOSIS — J9621 Acute and chronic respiratory failure with hypoxia: Secondary | ICD-10-CM | POA: Diagnosis not present

## 2023-02-13 DIAGNOSIS — J9622 Acute and chronic respiratory failure with hypercapnia: Secondary | ICD-10-CM | POA: Diagnosis not present

## 2023-02-13 LAB — CBC WITH DIFFERENTIAL/PLATELET
Abs Immature Granulocytes: 0.07 10*3/uL (ref 0.00–0.07)
Basophils Absolute: 0 10*3/uL (ref 0.0–0.1)
Basophils Relative: 0 %
Eosinophils Absolute: 0.3 10*3/uL (ref 0.0–0.5)
Eosinophils Relative: 4 %
HCT: 32.8 % — ABNORMAL LOW (ref 36.0–46.0)
Hemoglobin: 9.6 g/dL — ABNORMAL LOW (ref 12.0–15.0)
Immature Granulocytes: 1 %
Lymphocytes Relative: 25 %
Lymphs Abs: 2 10*3/uL (ref 0.7–4.0)
MCH: 28.5 pg (ref 26.0–34.0)
MCHC: 29.3 g/dL — ABNORMAL LOW (ref 30.0–36.0)
MCV: 97.3 fL (ref 80.0–100.0)
Monocytes Absolute: 0.6 10*3/uL (ref 0.1–1.0)
Monocytes Relative: 8 %
Neutro Abs: 5 10*3/uL (ref 1.7–7.7)
Neutrophils Relative %: 62 %
Platelets: 231 10*3/uL (ref 150–400)
RBC: 3.37 MIL/uL — ABNORMAL LOW (ref 3.87–5.11)
RDW: 12.5 % (ref 11.5–15.5)
WBC: 8 10*3/uL (ref 4.0–10.5)
nRBC: 0 % (ref 0.0–0.2)

## 2023-02-13 LAB — BASIC METABOLIC PANEL
Anion gap: 8 (ref 5–15)
BUN: 39 mg/dL — ABNORMAL HIGH (ref 8–23)
CO2: 31 mmol/L (ref 22–32)
Calcium: 10.1 mg/dL (ref 8.9–10.3)
Chloride: 96 mmol/L — ABNORMAL LOW (ref 98–111)
Creatinine, Ser: 1.89 mg/dL — ABNORMAL HIGH (ref 0.44–1.00)
GFR, Estimated: 29 mL/min — ABNORMAL LOW (ref >60)
Glucose, Bld: 118 mg/dL — ABNORMAL HIGH (ref 70–99)
Potassium: 4.5 mmol/L (ref 3.5–5.1)
Sodium: 135 mmol/L (ref 135–145)

## 2023-02-13 MED ORDER — METFORMIN HCL 500 MG PO TABS
500.0000 mg | ORAL_TABLET | Freq: Every day | ORAL | Status: DC
  Administered 2023-02-14: 500 mg via ORAL
  Filled 2023-02-13: qty 1

## 2023-02-13 MED ORDER — GABAPENTIN 400 MG PO CAPS
400.0000 mg | ORAL_CAPSULE | Freq: Every day | ORAL | Status: DC
  Administered 2023-02-13 – 2023-02-15 (×3): 400 mg via ORAL
  Filled 2023-02-13 (×3): qty 1

## 2023-02-13 MED ORDER — ALBUTEROL SULFATE (2.5 MG/3ML) 0.083% IN NEBU
2.5000 mg | INHALATION_SOLUTION | Freq: Two times a day (BID) | RESPIRATORY_TRACT | Status: DC
  Administered 2023-02-13 – 2023-02-15 (×4): 2.5 mg via RESPIRATORY_TRACT
  Filled 2023-02-13 (×4): qty 3

## 2023-02-13 MED ORDER — METFORMIN HCL 850 MG PO TABS: 850.0000 mg | ORAL_TABLET | Freq: Every day | ORAL | Status: DC

## 2023-02-13 NOTE — Progress Notes (Signed)
Physical Therapy Treatment Patient Details Name: Janice Ryan MRN: [id number] DOB: 07/30/1955 Today's Date: 02/13/2023   History of Present Illness patient is a 67 year old female who presented 11/6 after a fall at home in bathtub. patient's imaging was negative for fracture but noted to have cervical, thoracic and lumbar spondylosis in imaging. 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  PMH: anxiety, chronic right knee pain, chronic hypercarbic respiratory failure.    PT Comments  KI applied in supine. The patient  was max assisted to sitting onto bed edge with 2 persons.  Patient stood with +3 for safety  to power up to stand at Rw. Patient stood x ~ 2 minutes, (static), unable to take a step. Patient stood x  2 with seated break.   If plan is discharge home, recommend the following: Two people to help with walking and/or transfers;Two people to help with bathing/dressing/bathroom;Assistance with cooking/housework;Assist for transportation;Help with stairs or ramp for entrance   Can travel by private vehicle        Equipment Recommendations  None recommended by PT    Recommendations for Other Services       Precautions / Restrictions Precautions Precaution Comments: monitor O2 on chronic O2 at home Required Braces or Orthoses: Knee Immobilizer - Right Knee Immobilizer - Right: On when out of bed or walking Restrictions Weight Bearing Restrictions: No RLE Weight Bearing: Weight bearing as tolerated     Mobility  Bed Mobility   Bed Mobility: Rolling Rolling: Mod assist, Max assist   Supine to sit: +2 for physical assistance, +2 for  safety/equipment, Max assist Sit to supine: +2 for safety/equipment, +2 for physical assistance, Total assist   General bed mobility comments: assist with each leg to facilitate rolling. KI in place.    Transfers Overall transfer level: Needs assistance Equipment used: Rolling walker (2 wheels) Transfers: Sit to/from Stand Sit to Stand: Max assist, +2 safety/equipment, +2 physical assistance (+3)           General transfer comment: assisted to pullup and  power up with 3 persons , stands at RW x ~ 2 minutes x 2 trials  unable to take a step    Ambulation/Gait                   Stairs             Wheelchair Mobility     Tilt Bed    Modified Rankin (Stroke Patients Only)       Balance Overall balance assessment: Needs assistance Sitting-balance support: Bilateral upper extremity supported Sitting balance-Leahy Scale: Fair Sitting balance - Comments: sitting at mid line, balance maintained   Standing balance support: Bilateral upper extremity supported, Reliant on assistive device for balance, During functional activity Standing balance-Leahy Scale: Poor Standing balance comment: static stand fair with RW                            Cognition Arousal: Alert Behavior During Therapy: WFL for tasks assessed/performed Overall Cognitive Status: Within Functional Limits for tasks assessed  Exercises      General Comments        Pertinent Vitals/Pain Pain Assessment Pain Score: 8  Pain Location: right  knee, ALL OVER Pain Descriptors / Indicators: Aching, Discomfort, Guarding Pain Intervention(s): Monitored during session    Home Living                          Prior Function            PT Goals (current goals can now be found in the care plan section) Progress towards PT goals: Progressing toward goals    Frequency    Min 1X/week      PT Plan       Co-evaluation PT/OT/SLP Co-Evaluation/Treatment: Yes Reason for Co-Treatment: For patient/therapist safety;To address functional/ADL transfers PT goals addressed during session: Mobility/safety with mobility OT goals addressed during session: ADL's and self-care      AM-PAC PT "6 Clicks" Mobility   Outcome Measure  Help needed turning from your back to your side while in a flat bed without using bedrails?: A Lot Help needed moving from lying on your back to sitting on the side of a flat bed without using bedrails?: A Lot Help needed moving to and from a bed to a chair (including a wheelchair)?: Total Help needed standing up from a chair using your arms (e.g., wheelchair or bedside chair)?: Total Help needed to walk in hospital room?: Total Help needed climbing 3-5 steps with a railing? : Total 6 Click Score: 8    End of Session Equipment Utilized During Treatment: Gait belt;Oxygen Activity Tolerance: Patient tolerated treatment well Patient left: in bed;with call bell/phone within reach Nurse Communication: Mobility status;Need for lift equipment PT Visit Diagnosis: Unsteadiness on feet (R26.81);Other abnormalities of gait and mobility (R26.89);Repeated falls (R29.6);Muscle weakness (generalized) (M62.81);Difficulty in walking, not elsewhere classified (R26.2);Pain Pain - Right/Left: Right Pain - part of body: Knee;Leg     Time: 1610-9604 PT Time Calculation (min) (ACUTE ONLY): 24 min  Charges:    $Therapeutic Activity: 8-22 mins PT General Charges $$ ACUTE PT VISIT: 1 Visit                     Blanchard Kelch PT Acute Rehabilitation Services Office [phone] Weekend [phone]    Rada Hay 02/13/2023, 2:46 PM

## 2023-02-13 NOTE — Telephone Encounter (Signed)
  Chief Complaint: Pt not receiving needed care while hospitalized Symptoms: in pain breathing issues Frequency: now Pertinent Negatives: Patient denies  Disposition: [] ED /[] Urgent Care (no appt availability in office) / [] Appointment(In office/virtual)/ []  [facility]/ [] Home Care/ [] Refused Recommended Disposition /[] [place] [facility]/ [x]  Follow-up with PCP Additional Notes: Call from pt who is currently hospitalized at [facility]. Pt states she in not receiving care for breathing issues. When O2 is removed her sats drop to 81 %. Additionally pt states that she is not getting the pain medication needed. Pt would like PCP to intervene in order for her to get the treatment needed.  Please advise. Pt is in room 1434.    Reason for Disposition  Call about patient who is currently hospitalized  Answer Assessment - Initial Assessment Questions 1. REASON FOR CALL or QUESTION: "What is your reason for calling today?" or "How can I best help you?" or "What question do you have that I can help answer?"     Pt not receiving  needed care.  2. CALLER: Document the source of call. (e.g., laboratory, patient).     Pt  Protocols used: PCP Call - No Triage-A-AH

## 2023-02-13 NOTE — Progress Notes (Signed)
   02/13/23 0342  BiPAP/CPAP/SIPAP  BiPAP/CPAP/SIPAP Pt Type Adult  BiPAP/CPAP/SIPAP V60  Mask Type Full face mask  Mask Size Large  Set Rate 14 breaths/min  Respiratory Rate 20 breaths/min  IPAP 14 cmH20  EPAP 8 cmH2O  FiO2 (%) 40 %  Minute Ventilation 7.4  Leak 10  Peak Inspiratory Pressure (PIP) 13  Tidal Volume (Vt) 577  Patient Home Equipment No  Auto Titrate No  Press High Alarm 30 cmH2O  Press Low Alarm 5 cmH2O  Oxygen Percent 40 %   Pt. Tolerating V-60 BiPAP well.

## 2023-02-13 NOTE — Telephone Encounter (Signed)
Call 911. Also, Dr. Andrey Campanile please advise further if needed. Thank you.

## 2023-02-13 NOTE — Telephone Encounter (Signed)
Copied from CRM [phone]. Topic: Referral - Request for Referral >> Feb 13, 2023  1:39 PM Phill Myron wrote: Patient is currently and inpatient at [facility] . She stated she spoke with [facility] and they are requesting a referral.   Patient also needs and appointment with he Provider (virtual she ask)... she also would like to talk with her provider regarding rehab services

## 2023-02-13 NOTE — Telephone Encounter (Signed)
Patient has been called and msg left to reach out to her charge nurse/nurse. Advise patient that provider will be able to see her for HFU after discharge

## 2023-02-13 NOTE — Telephone Encounter (Signed)
Please advise patient.  

## 2023-02-13 NOTE — Plan of Care (Signed)
  Problem: Education: Goal: Knowledge of General Education information will improve Description: Including pain rating scale, medication(s)/side effects and non-pharmacologic comfort measures Outcome: Progressing   Problem: Health Behavior/Discharge Planning: Goal: Ability to manage health-related needs will improve Outcome: Progressing   Problem: Clinical Measurements: Goal: Ability to maintain clinical measurements within normal limits will improve Outcome: Progressing Goal: Will remain free from infection Outcome: Progressing Goal: Diagnostic test results will improve Outcome: Progressing Goal: Respiratory complications will improve Outcome: Progressing Goal: Cardiovascular complication will be avoided Outcome: Progressing   Problem: Activity: Goal: Risk for activity intolerance will decrease Outcome: Progressing   Problem: Coping: Goal: Level of anxiety will decrease Outcome: Progressing   Problem: Elimination: Goal: Will not experience complications related to bowel motility Outcome: Progressing Goal: Will not experience complications related to urinary retention Outcome: Progressing   Problem: Pain Management: Goal: General experience of comfort will improve Outcome: Progressing   Problem: Safety: Goal: Ability to remain free from injury will improve Outcome: Progressing   Problem: Skin Integrity: Goal: Risk for impaired skin integrity will decrease Outcome: Progressing

## 2023-02-13 NOTE — Progress Notes (Signed)
   02/13/23 0022  BiPAP/CPAP/SIPAP  BiPAP/CPAP/SIPAP Pt Type Adult  BiPAP/CPAP/SIPAP V60  Mask Type Full face mask  Mask Size Large  Set Rate 14 breaths/min  Respiratory Rate 18 breaths/min  IPAP 14 cmH20  EPAP 8 cmH2O  FiO2 (%) 40 %  Flow Rate 0 lpm  Minute Ventilation 8.4  Leak 8  Peak Inspiratory Pressure (PIP) 14  Tidal Volume (Vt) 511  Patient Home Equipment No  Auto Titrate No  Press High Alarm 30 cmH2O  Press Low Alarm 5 cmH2O  CPAP/SIPAP surface wiped down Yes  Oxygen Percent 40 %   Pt. Placed on BiPAP V-60 at this time, RN made aware.

## 2023-02-13 NOTE — Progress Notes (Signed)
Occupational Therapy Treatment Patient Details Name: Janice Ryan MRN: [id number] DOB: 07/09/1955 Today's Date: 02/13/2023   History of present illness patient is a 67 year old female who presented 11/6 after a fall at home in bathtub. patient's imaging was negative for fracture but noted to have cervical, thoracic and lumbar spondylosis in imaging. 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  PMH: anxiety, chronic right knee pain, chronic hypercarbic respiratory failure.   OT comments  Patient was able to engage in standing at EOB with +3 with KI in place. Patient with increased rocking to power up into standing. Patient able to stand for about 2 mins in standing with two standing trials today. Patient will benefit from continued inpatient follow up therapy, <3 hours/day. Patient's discharge plan remains appropriate at this time. OT will continue to follow acutely.        If plan is discharge home, recommend the following:  Two people to help with walking and/or transfers;Assistance with cooking/housework;Two people to help with bathing/dressing/bathroom;Direct supervision/assist for medications management;Assist for transportation;Help with stairs or ramp for entrance;Direct supervision/assist for financial management   Equipment Recommendations  None recommended by OT       Precautions / Restrictions Precautions Precautions: Fall Precaution Comments: monitor O2 on chronic O2 at home Required Braces or Orthoses: Knee Immobilizer - Right Knee Immobilizer - Right: On when out of bed or walking Restrictions Weight Bearing Restrictions: No RLE Weight Bearing:  Weight bearing as tolerated       Mobility Bed Mobility Overal bed mobility: Needs Assistance Bed Mobility: Rolling Rolling: Mod assist, Max assist   Supine to sit: +2 for physical assistance, +2 for safety/equipment, Max assist Sit to supine: +2 for safety/equipment, +2 for physical assistance, Total assist   General bed mobility comments: assist with each leg to facilitate rolling. KI in place. Able to tolerate left resting on bed rail after rolling.         Balance Overall balance assessment: Needs assistance Sitting-balance support: Bilateral upper extremity supported Sitting balance-Leahy Scale: Fair Sitting balance - Comments: sitting at mid line, balance maintained   Standing balance support: Bilateral upper extremity supported, Reliant on assistive device for balance, During functional activity Standing balance-Leahy Scale: Poor                             ADL either performed or assessed with clinical judgement   ADL Overall ADL's : Needs assistance/impaired     Grooming: Brushing hair;Moderate assistance;Sitting Grooming Details (indicate cue type and reason): EOB with increased time lots of knots on back               Lower Body Dressing Details (indicate cue type and reason): TD to get KI in place. Toilet Transfer: +2 for physical assistance;+2 for safety/equipment Toilet Transfer Details (indicate cue type and reason): +3 for standing up at EOB with increased time.                  Cognition Arousal: Alert Behavior During Therapy: WFL for tasks assessed/performed Overall Cognitive Status: Within Functional Limits for tasks assessed         General Comments: motivated to participate  Pertinent Vitals/ Pain       Pain Assessment Pain Assessment: 0-10 Pain Score: 8  Pain Location: right  knee, ALL OVER Pain Descriptors / Indicators: Aching, Discomfort, Guarding Pain Intervention(s): Monitored during  session, Limited activity within patient's tolerance         Frequency  Min 1X/week        Progress Toward Goals  OT Goals(current goals can now be found in the care plan section)  Progress towards OT goals: Progressing toward goals     Plan      Co-evaluation      Reason for Co-Treatment: For patient/therapist safety;To address functional/ADL transfers PT goals addressed during session: Mobility/safety with mobility OT goals addressed during session: ADL's and self-care      AM-PAC OT "6 Clicks" Daily Activity     Outcome Measure   Help from another person eating meals?: A Little Help from another person taking care of personal grooming?: A Lot Help from another person toileting, which includes using toliet, bedpan, or urinal?: Total Help from another person bathing (including washing, rinsing, drying)?: Total Help from another person to put on and taking off regular upper body clothing?: A Lot Help from another person to put on and taking off regular lower body clothing?: Total 6 Click Score: 10    End of Session Equipment Utilized During Treatment: Gait belt;Rolling walker (2 wheels);Right knee immobilizer  OT Visit Diagnosis: Unsteadiness on feet (R26.81);Other abnormalities of gait and mobility (R26.89);Muscle weakness (generalized) (M62.81);Pain   Activity Tolerance Patient tolerated treatment well   Patient Left in bed;with call bell/phone within reach   Nurse Communication Mobility status        Time: 1610-9604 OT Time Calculation (min): 44 min  Charges: OT General Charges $OT Visit: 1 Visit OT Treatments $Therapeutic Activity: 23-37 mins  Rosalio Loud, MS Acute Rehabilitation Department Office# [phone]   Selinda Flavin 02/13/2023, 3:46 PM

## 2023-02-13 NOTE — TOC Progression Note (Signed)
Transition of Care [facility]) - Progression Note    Patient Details  Name: Janice Ryan MRN: [id number] Date of Birth: [birth year]  Transition of Care [facility]) CM/SW Contact  Larrie Kass, LCSW Phone Number: 02/13/2023, 9:49 AM  Clinical Narrative:     SNF beds pending. Contacted [facility], unable to accept. Contacted [facility] unable to accept.   CSW spoke to Tammy with  [facility], to follow up about bed offers. She stated she is currently in a meeting and will call CSW back. TOC to follow.    Expected Discharge Plan: Skilled Nursing Facility  Expected Discharge Plan and Services In-house Referral: NA Discharge Planning Services: CM Consult Post Acute Care Choice: NA Living arrangements for the past 2 months: Apartment                 DME Arranged: N/A DME Agency: NA       HH Arranged: NA HH Agency: NA         Social Determinants of Health (SDOH) Interventions SDOH Screenings   Food Insecurity: No Food Insecurity (02/09/2023)  Housing: Low Risk  (02/09/2023)  Transportation Needs: Unmet Transportation Needs (02/09/2023)  Utilities: Not At Risk (02/09/2023)  Depression (PHQ2-9): Low Risk  (07/06/2021)  Social Connections: Unknown (10/10/2022)   Received from [facility]  Tobacco Use: Low Risk  (02/08/2023)    Readmission Risk Interventions    02/08/2023    4:56 PM  Readmission Risk Prevention Plan  Post Dischage Appt Complete  Medication Screening Complete  Transportation Screening Complete

## 2023-02-13 NOTE — Progress Notes (Addendum)
HOSPITALIST ROUNDING NOTE Janice Ryan MWU:[id number]  DOB: 04/30/1955  DOA: 02/07/2023  PCP: [name], NP  02/13/2023,1:31 PM   LOS: 5 days      Code Status: full  From:  home     Current Dispo: snf     67 year old female, super morbid obesity weight 202 kg Restrictive + obstructive lung disease-chronic respiratory failure  on oxygen at home at baseline--- supposed to be on BiPAP/trilogy vent-previous issues with obtaining this as uninsured Hypertension Depression ?  Pancreatic colonic inflammation 03/2019-also Mediastinal nodule 2.8 x 2.2 cm--- this was recommended for follow-up in the outpatient setting HTN Known osteoarthritis with prosthetic left knee Chronic lower extremity edema with recurrent cellulitis Also has chronic pain and is followed with [facility]--     Notes indicate fall February 05, 2023 at home-presented to ED and was treated for pain-she then became hypoxic sats dropped into the 60s she was slightly confused Because of confusion workup was pursued showing ABG with pCO2 70 pH 7.3 BNP 118 CT angio showed no pulmonary embolism, pulmonary hypertension no evidence  11/10-Mr knee shows severe rupture MCL   Plan  Acute hypoxic/hypercarbic respiratory failure on admission causing encephalopathy Chronic respiratory failure on 5 L at baseline--was 2/2 iatrogenic pain control in the ED Much improved --mandatory ventilator with large size mask  when asleep(was not able to use small size mask or find a mask that fit her at home)---TOC made aware of need for equipment at d/c  Accidental fall with severe knee pain R left knee surgery previously at [facility] several years ago X-ray shows severe arthritis --severe pain with flexion--MRI shows severe avulsion R knee MCL--d/r [name] Knee immobilizer with mobility, f/u OP [facility] with Dr. [name] 10-14 d for definitive management increased her gabapentin to 400 hs---avoid meds with  potential for encephalopathy avoid tramadol [can use low-dose sparingly], --use Tylenol 1000 every 8 as needed, stop Mobic given azotemia  For neuropathy/psych euthymic continue Cymbalta DR 120 daily Capsaicin + diclo also for topical pain relief--mild improvement with these DO NOT ADJUST MEDS FOR PAIN unless daytime rounder sanctions this  Restrictive and obstructive lung disease Slept well with trilogy vent-TOC to comment Needs OP follow up Pulmonary  Depression Continue BuSpar 5 3 times daily, doxepin 50 Cymbalta as above Do not restart the patient on Adderall , hold trazodone at night given risk of encephalopathy  Hypertension Stop lisinopril as mild AKI Can continue hydrochlorothiazide 12.5 Follow periodic labs  Mild AKI iatrogenic secondary to meds Slight alkalosis secondary to contraction Creatinine has risen over the past 48 hours I have stopped the lisinopril and the Mobic-force oral fluids-if no better may need small dose of IVF short-term  Super morbid obesity BMI >50 Life-threatening-she understands need for weight loss--risk benefit of off label metformin 500 daily Do not escalate and watch labs as may need to discontinue ---She was attempting to get onto [facility] or weight loss supplement She may benefit from weight loss surgery referral as OP   DVT prophylaxis: Heparin  Status is: Inpatient Remains inpatient appropriate because:   We continue to await skilled nursing placement  Subjective:  Engaging-worked with therapy-multiple questions about foot pain and then follow-up with pulmonology etc. etc.-I have explained to her that this will need to be coordinated in the outpatient and she understands clearly   Objective + exam Vitals:   02/12/23 2152 02/13/23 0518 02/13/23 0745 02/13/23 1310  BP: (!) 107/57 (!) 159/55  (!) 127/99  Pulse: 75 67  72  Resp: 14 15  19   Temp: 98.7 F (37.1 C) (!) 97.4 F (36.3 C)  98.4 F (36.9 C)  TempSrc: Oral Oral  Oral   SpO2: 95% 95% 96% 95%  Weight:      Height:       Filed Weights   02/08/23 1033  Weight: (!) 193 kg    Examination:  No distress superobese black female  S1-S2 no murmur Abdomen soft no rebound Chest relatively clear  Data Reviewed: reviewed   CBC    Component Value Date/Time   WBC 8.0 02/13/2023 0421   RBC 3.37 (L) 02/13/2023 0421   HGB 9.6 (L) 02/13/2023 0421   HCT 32.8 (L) 02/13/2023 0421   PLT 231 02/13/2023 0421   MCV 97.3 02/13/2023 0421   MCH 28.5 02/13/2023 0421   MCHC 29.3 (L) 02/13/2023 0421   RDW 12.5 02/13/2023 0421   LYMPHSABS 2.0 02/13/2023 0421   MONOABS 0.6 02/13/2023 0421   EOSABS 0.3 02/13/2023 0421   BASOSABS 0.0 02/13/2023 0421      Latest Ref Rng & Units 02/13/2023    4:21 AM 02/11/2023    4:19 AM 02/10/2023    4:46 AM  CMP  Glucose 70 - 99 mg/dL 161  096  045   BUN 8 - 23 mg/dL 39  21  16   Creatinine 0.44 - 1.00 mg/dL 4.09  8.11  9.14   Sodium 135 - 145 mmol/L 135  137  136   Potassium 3.5 - 5.1 mmol/L 4.5  4.6  4.2   Chloride 98 - 111 mmol/L 96  97  95   CO2 22 - 32 mmol/L 31  31  33   Calcium 8.9 - 10.3 mg/dL 78.2  95.6  21.3   Total Protein 6.5 - 8.1 g/dL  6.7  6.8   Total Bilirubin <1.2 mg/dL  0.4  0.5   Alkaline Phos 38 - 126 U/L  60  61   AST 15 - 41 U/L  11  10   ALT 0 - 44 U/L  9  11      Scheduled Meds:  albuterol  2.5 mg Nebulization BID   busPIRone  5 mg Oral TID   capsicum   Topical BID   doxepin  50 mg Oral Daily   DULoxetine  120 mg Oral Daily   enoxaparin (LOVENOX) injection  100 mg Subcutaneous Daily   gabapentin  400 mg Oral QHS   Gerhardt's butt cream   Topical BID   hydrochlorothiazide  12.5 mg Oral Daily   [START ON 02/14/2023] metFORMIN  500 mg Oral Q breakfast   mometasone-formoterol  2 puff Inhalation BID   montelukast  10 mg Oral QHS   mouth rinse  15 mL Mouth Rinse 4 times per day   Continuous Infusions:  Time  26  Rhetta Mura, MD  [facility]

## 2023-02-14 ENCOUNTER — Ambulatory Visit (HOSPITAL_BASED_OUTPATIENT_CLINIC_OR_DEPARTMENT_OTHER): Payer: 59 | Admitting: Orthopaedic Surgery

## 2023-02-14 DIAGNOSIS — J9622 Acute and chronic respiratory failure with hypercapnia: Secondary | ICD-10-CM | POA: Diagnosis not present

## 2023-02-14 DIAGNOSIS — J9621 Acute and chronic respiratory failure with hypoxia: Secondary | ICD-10-CM | POA: Diagnosis not present

## 2023-02-14 LAB — BASIC METABOLIC PANEL
Anion gap: 11 (ref 5–15)
BUN: 38 mg/dL — ABNORMAL HIGH (ref 8–23)
CO2: 30 mmol/L (ref 22–32)
Calcium: 10.7 mg/dL — ABNORMAL HIGH (ref 8.9–10.3)
Chloride: 94 mmol/L — ABNORMAL LOW (ref 98–111)
Creatinine, Ser: 1.44 mg/dL — ABNORMAL HIGH (ref 0.44–1.00)
GFR, Estimated: 40 mL/min — ABNORMAL LOW (ref >60)
Glucose, Bld: 101 mg/dL — ABNORMAL HIGH (ref 70–99)
Potassium: 4.8 mmol/L (ref 3.5–5.1)
Sodium: 135 mmol/L (ref 135–145)

## 2023-02-14 LAB — CREATININE, SERUM
Creatinine, Ser: 1.39 mg/dL — ABNORMAL HIGH (ref 0.44–1.00)
GFR, Estimated: 42 mL/min — ABNORMAL LOW (ref >60)

## 2023-02-14 NOTE — Telephone Encounter (Signed)
Called pt and left vm to call office back to schedule.

## 2023-02-14 NOTE — Progress Notes (Addendum)
PROGRESS NOTE    Janice Ryan  ZOX:[id number] DOB: 1956-03-12 DOA: 02/07/2023 PCP: [name], NP   Brief Narrative: 67 year old with past medical history significant for super morbid obesity, restrictive and obstructive lung disease, chronic respiratory failure on home oxygen, supposed to be on BiPAP/trilogy vent, previous issues with obtaining this as she is uninsured, hypertension, depression, questionable pancreatic inflammation, colon inflammation on CTA chest 2020, needs follow up. 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  Knee MRI shows severe Rupture MCL   Assessment & Plan:   Principal Problem:   Acute on chronic respiratory failure with hypoxia and hypercapnia (HCC) Active Problems:   Fall at home, initial encounter   Chronic pain of right knee   OSA (obstructive sleep apnea)   Obesity hypoventilation syndrome (HCC)   Asthma   1-Acute on chronic  hypoxic/Hypercapnic respiratory failure Acute metabolic  encephalopathy in the setting of acute hypoxic hypercapnic respiratory failure She require 5 L of oxygen at baseline.  In the setting of pain medication that she received in the ED This has resolved with BIPAP.  Continue with BIPAP at HS.   Severe avulsion  of right knee MCL Accidental fall with severe right knee pain X-ray shows severe arthritis  MRI shows severe avulsion of right knee MCL Case was discussed with Dr. He with who recommended knee immobilizer with mobility and follow-up with [facility] with Dr. [name] 10 to 14 days after admission Continue with gabapentin, Tylenol.Cymbalta  Avoid titration of opioids to avoid encephalopathy  Right hip pain: CT of the hip with no acute osseous  abnormality.  Moderate osteoarthritis of the hips  Restrictive and obstructive lung disease Continue with trilogy vent  Depression: continue BuSpar Cymbalta  Hypertension: Continue to hold Lisinopril due to AKI.  Monitor renal function on hydrochlorothiazide>    AKI: In setting of NSAIDs and lisinopril.  Received IV fluids.  Renal function improved  Super morbid obesity: Needs life style modification   Estimated body mass index is 65.66 kg/m as calculated from the following:   Height as of this encounter: 5' 7.5" (1.715 m).   Weight as of this encounter: 193 kg.   DVT prophylaxis: Lovenox Code Status: Full code Family Communication: Care discussed with patient Disposition Plan:  Status is: Inpatient Remains inpatient appropriate because: knee pain, resp failure. Discharge 11/14    Consultants:  Dr Victorino Dike  Procedures:  None  Antimicrobials:    Subjective: She is alert, report knee pain, hip pain. Some abdominal pain when she has BM.    Objective: Vitals:   02/14/23 0140 02/14/23 0305 02/14/23 0411 02/14/23 0803  BP:   (!) 156/70   Pulse: 68 74 65   Resp: 18 19 16    Temp:   99.3 F (37.4 C)   TempSrc:   Oral   SpO2: 95% 95% 98% 93%  Weight:      Height:        Intake/Output Summary (Last 24 hours) at 02/14/2023 0820 Last data filed at 02/14/2023 0416 Gross per 24 hour  Intake 120 ml  Output 2601 ml  Net -2481 ml   Filed Weights   02/08/23 1033  Weight: (!) 193 kg    Examination:  General exam: Appears calm and comfortable  Respiratory system: Clear to auscultation. Respiratory effort normal. Cardiovascular system: S1 & S2 heard, RRR. No JVD, murmurs, rubs, gallops or clicks. No pedal edema. Gastrointestinal system: Abdomen is nondistended, soft and nontender. No organomegaly or masses felt. Normal bowel sounds heard. Central nervous system: Alert and oriented. No focal neurological deficits. Extremities: Symmetric 5 x 5 power.  Data  Reviewed: I have personally reviewed following labs and imaging studies  CBC: Recent Labs  Lab 02/07/23 1725 02/09/23 0254 02/10/23 0446 02/11/23 0419 02/13/23 0421  WBC 10.2 10.3 8.5 10.9* 8.0  NEUTROABS 6.9 7.3 5.8 7.3 5.0  HGB 11.3* 10.7* 10.9* 11.5* 9.6*  HCT 37.9 36.9 37.3 39.3 32.8*  MCV 97.7 97.6 96.9 98.0 97.3  PLT 259 220 265 276 231   Basic Metabolic Panel: Recent Labs  Lab 02/07/23 1725 02/09/23 0254 02/10/23 0446 02/11/23 0419 02/13/23 0421 02/14/23 0408  NA 139 136 136 137 135  --   K 4.1 3.9 4.2 4.6 4.5  --   CL 99 101 95* 97* 96*  --   CO2 32 29 33* 31 31  --   GLUCOSE 120* 112* 112* 114* 118*  --   BUN 17 16 16 21  39*  --   CREATININE 1.13* 0.86 0.97 1.34* 1.89* 1.39*  CALCIUM 9.8 9.7 10.1 11.0* 10.1  --    GFR: Estimated Creatinine Clearance: 73.2 mL/min (A) (by C-G formula based on SCr of 1.39 mg/dL (H)). Liver Function Tests: Recent Labs  Lab 02/07/23 1725 02/09/23 0254 02/10/23 0446 02/11/23 0419  AST 12* 10* 10* 11*  ALT 11 10 11 9   ALKPHOS 72 62 61 60  BILITOT 0.7 0.7 0.5 0.4  PROT 7.2 6.8 6.8 6.7  ALBUMIN 3.5 3.3* 3.3* 3.2*   No results for input(s): "LIPASE", "AMYLASE" in the last 168 hours. No results for input(s): "AMMONIA" in the last 168 hours. Coagulation Profile: No results for input(s): "INR", "PROTIME" in the last 168 hours. Cardiac Enzymes: No results for input(s): "CKTOTAL", "CKMB", "CKMBINDEX", "TROPONINI" in the last 168 hours. BNP (last 3 results) No results for input(s): "PROBNP" in the last 8760 hours. HbA1C: No results for input(s): "HGBA1C" in the last 72 hours. CBG: No results for input(s): "GLUCAP" in the last 168 hours. Lipid Profile: No results for input(s): "CHOL", "HDL", "LDLCALC", "TRIG", "CHOLHDL", "LDLDIRECT" in the last 72 hours. Thyroid Function Tests: No results for input(s): "TSH", "T4TOTAL", "FREET4", "T3FREE", "THYROIDAB" in the last 72 hours. Anemia Panel: No results for input(s):  "VITAMINB12", "FOLATE", "FERRITIN", "TIBC", "IRON", "RETICCTPCT" in the last 72 hours. Sepsis Labs: No results for input(s): "PROCALCITON", "LATICACIDVEN" in the last 168 hours.  Recent Results (from the past 240 hour(s))  MRSA Next Gen by PCR, Nasal     Status: None   Collection Time: 02/08/23 10:52 AM   Specimen: Nasal Mucosa; Nasal Swab  Result Value Ref Range Status   MRSA by PCR Next Gen NOT DETECTED NOT DETECTED Final    Comment: (NOTE) The GeneXpert MRSA Assay (FDA approved for NASAL specimens only), is one component of a comprehensive MRSA colonization surveillance program. It is not intended to diagnose MRSA infection nor to guide or monitor treatment for MRSA infections. Test performance is not FDA approved in patients less than 90 years old. Performed at [facility], [address], [place], [place] [address]          Radiology Studies: No results found.      Scheduled Meds:  albuterol  2.5 mg Nebulization BID  busPIRone  5 mg Oral TID   capsicum   Topical BID   doxepin  50 mg Oral Daily   DULoxetine  120 mg Oral Daily   enoxaparin (LOVENOX) injection  100 mg Subcutaneous Daily   gabapentin  400 mg Oral QHS   Gerhardt's butt cream   Topical BID   hydrochlorothiazide  12.5 mg Oral Daily   metFORMIN  500 mg Oral Q breakfast   mometasone-formoterol  2 puff Inhalation BID   montelukast  10 mg Oral QHS   mouth rinse  15 mL Mouth Rinse 4 times per day   Continuous Infusions:   LOS: 6 days    Time spent: 35 Minutes    Adriena Manfre A Jerline Linzy, MD [facility]   If 7PM-7AM, please contact night-coverage [email or web address]  02/14/2023, 8:20 AM

## 2023-02-14 NOTE — Progress Notes (Signed)
   02/14/23 0140  BiPAP/CPAP/SIPAP  BiPAP/CPAP/SIPAP Pt Type Adult (pt now ready for bipap placement)  BiPAP/CPAP/SIPAP V60  Mask Type Full face mask  Mask Size Large  Set Rate 14 breaths/min  Respiratory Rate 18 breaths/min  IPAP 14 cmH20  EPAP 8 cmH2O  FiO2 (%) 40 %  Minute Ventilation 11.1  Leak 8  Peak Inspiratory Pressure (PIP) 14  Tidal Volume (Vt) 613  Patient Home Equipment No  Auto Titrate No  Press High Alarm 30 cmH2O  Press Low Alarm 5 cmH2O  CPAP/SIPAP surface wiped down Yes  BiPAP/CPAP /SiPAP Vitals  Pulse Rate 68  Resp 18  SpO2 95 %  Bilateral Breath Sounds Diminished  MEWS Score/Color  MEWS Score 0  MEWS Score Color [place] Si

## 2023-02-14 NOTE — Progress Notes (Signed)
Physical Therapy Treatment Patient Details Name: Janice Ryan MRN: [id number] DOB: [birth year] Today's Date: 02/14/2023   History of Present Illness 67 year old female who presented 11/6 after a fall at home in bathtub. patient's imaging was negative for fracture but noted to have cervical, thoracic and lumbar spondylosis in imaging. 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  PMH: anxiety, chronic right knee pain, chronic hypercarbic respiratory failure.    PT Comments  NT asked for assistance with a patient. Arrived to room, pt sitting EOB preparing to transfer to [facility]. +2 assist to pivot to [facility]. NT remained in room with patient upon my exit. Patient will benefit from continued inpatient follow up therapy, <3 hours/day.    If plan is discharge home, recommend the following: Two people to help with walking and/or transfers;Two people to help with bathing/dressing/bathroom;Assistance with cooking/housework;Assist for transportation;Help with stairs or ramp for entrance   Can travel by private vehicle        Equipment Recommendations  None recommended by PT    Recommendations for Other Services       Precautions / Restrictions Precautions Precautions: Fall Precaution Comments: monitor O2 on chronic O2 at home Required Braces or Orthoses: Knee Immobilizer - Right Knee Immobilizer - Right: On when out of bed or walking Restrictions Weight Bearing Restrictions: No RLE Weight Bearing: Weight bearing as tolerated Other Position/Activity Restrictions: patient declined use of KI at this time.     Mobility  Bed Mobility               General bed  mobility comments: pt sitting EOB (NT asked for help getting pt to bsc)    Transfers Overall transfer level: Needs assistance Equipment used:  (relied on armrests of bsc vs bed surface) Transfers: Sit to/from Stand, Bed to chair/wheelchair/BSC Sit to Stand: +2 physical assistance, +2 safety/equipment, Mod assist, From elevated surface           General transfer comment: Pt declined used of KI. Modified stand pivot with pt using elevated bed, rocking momentum, bsc armrests to pull up on. Pt then pivoted in place with 2 hands support of either bsc armrests or bed surface. increased time. cues for safety.    Ambulation/Gait                   Stairs             Wheelchair Mobility     Tilt Bed    Modified Rankin (Stroke Patients Only)       Balance Overall balance assessment: Needs assistance         Standing balance support: Bilateral upper extremity supported, Reliant on assistive device for balance, During functional activity Standing balance-Leahy Scale: Poor                              Cognition Arousal: Alert Behavior During Therapy: WFL for tasks assessed/performed Overall Cognitive Status: Within Functional Limits for tasks assessed                                 General Comments: motivated to  get to bsc        Exercises      General Comments        Pertinent Vitals/Pain Pain Assessment Pain Assessment: Faces Faces Pain Scale: Hurts little more Pain Location: R knee Pain Descriptors / Indicators: Aching, Discomfort, Guarding Pain Intervention(s): Limited activity within patient's tolerance, Monitored during session, Repositioned    Home Living                          Prior Function            PT Goals (current goals can now be found in the care plan section) Progress towards PT goals: Progressing toward goals    Frequency    Min 1X/week      PT Plan      Co-evaluation    Reason for Co-Treatment: For patient/therapist safety;To address functional/ADL transfers PT goals addressed during session: Mobility/safety with mobility OT goals addressed during session: ADL's and self-care      AM-PAC PT "6 Clicks" Mobility   Outcome Measure  Help needed turning from your back to your side while in a flat bed without using bedrails?: A Lot Help needed moving from lying on your back to sitting on the side of a flat bed without using bedrails?: A Lot Help needed moving to and from a bed to a chair (including a wheelchair)?: A Lot Help needed standing up from a chair using your arms (e.g., wheelchair or bedside chair)?: A Lot Help needed to walk in hospital room?: Total Help needed climbing 3-5 steps with a railing? : Total 6 Click Score: 10    End of Session Equipment Utilized During Treatment: Oxygen Activity Tolerance: Patient tolerated treatment well Patient left: with call bell/phone within reach;with nursing/sitter in room (sitting on bsc with NT in room) Nurse Communication: Mobility status PT Visit Diagnosis: Unsteadiness on feet (R26.81);Other abnormalities of gait and mobility (R26.89);Repeated falls (R29.6);Muscle weakness (generalized) (M62.81);Difficulty in walking, not elsewhere classified (R26.2);Pain Pain - Right/Left: Right Pain - part of body: Knee;Leg     Time: 1700-1710 PT Time Calculation (min) (ACUTE ONLY): 10 min  Charges:    $Therapeutic Exercise: 8-22 mins PT General Charges $$ ACUTE PT VISIT: 1 Visit                         Faye Ramsay, PT Acute Rehabilitation  Office: [phone]

## 2023-02-14 NOTE — TOC Progression Note (Addendum)
Transition of Care [facility]) - Progression Note    Patient Details  Name: Janice Ryan MRN: [id number] Date of Birth: 1955/12/04  [facility] CM/SW Contact  Larrie Kass, LCSW Phone Number: 02/14/2023, 9:53 AM  Clinical Narrative:    SNF beds still pending.   CSW reached out to  [facility]/ [facility]-declined  [facility]- declined [facility]-declined [facility]- reviewing [facility]- reviewing [facility]- declined  [facility]-declined.  ADDEN 11:00am Received message from [facility] intake with [facility], she has extended bed offer for SNF placement. CSW spoke with pt and she has accepted bed offer. Pt will need new insurance authorization. TOC to follow  4:20pm Pt's insurance auth pending. TOC to follow.  Expected Discharge Plan: Skilled Nursing Facility Barriers to Discharge: Continued Medical Work up  Expected Discharge Plan and Services In-house Referral: NA Discharge Planning Services: CM Consult Post Acute Care Choice: NA Living arrangements for the past 2 months: Apartment                 DME Arranged: N/A DME Agency: NA       HH Arranged: NA HH Agency: NA         Social Determinants of Health (SDOH) Interventions SDOH Screenings   Food Insecurity: No Food Insecurity (02/09/2023)  Housing: Low Risk  (02/09/2023)  Transportation Needs: Unmet Transportation Needs (02/09/2023)  Utilities: Not At Risk (02/09/2023)  Depression (PHQ2-9): Low Risk  (07/06/2021)  Social Connections: Unknown (10/10/2022)   [facility] from [facility], [facility]  Tobacco Use: Low Risk  (02/08/2023)    Readmission Risk Interventions    02/08/2023    4:56 PM  Readmission Risk Prevention Plan  Post Dischage Appt Complete  Medication Screening Complete  Transportation Screening Complete

## 2023-02-14 NOTE — Progress Notes (Signed)
Physical Therapy Treatment Patient Details Name: Janice Ryan MRN: [id number] DOB: 1955/07/26 Today's Date: 02/14/2023   History of Present Illness patient is a 67 year old female who presented 11/6 after a fall at home in bathtub. patient's imaging was negative for fracture but noted to have cervical, thoracic and lumbar spondylosis in imaging. 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  PMH: anxiety, chronic right knee pain, chronic hypercarbic respiratory failure.    PT Comments  Upon arrival, pt reports just finishing lunch and stomach is feeling distended, limiting ability to get OOB at this time so session focus on therapeutic exercises to improve strength. Cued pt on motor control decreasing momentum, increasing ROM and isometric hold at endrange as able. Pt with increasing R Knee pain with isometric quad set so discontinued; LLE without pain and able to perform exercises. Pt on 5L O2 with SpO2 92-95%. Patient will benefit from continued inpatient follow up therapy, <3 hours/day.    If plan is discharge home, recommend the following: Two people to help with walking and/or transfers;Two people to help with bathing/dressing/bathroom;Assistance with cooking/housework;Assist for transportation;Help with stairs or ramp for entrance   Can travel by private vehicle        Equipment Recommendations  None recommended by PT    Recommendations for Other Services       Precautions / Restrictions Precautions Precautions: Fall Precaution Comments: monitor O2 on chronic O2 at home Required Braces or Orthoses: Knee Immobilizer - Right Knee Immobilizer - Right: On  when out of bed or walking Restrictions Weight Bearing Restrictions: No RLE Weight Bearing: Weight bearing as tolerated     Mobility  Bed Mobility                    Transfers                        Ambulation/Gait                   Stairs             Wheelchair Mobility     Tilt Bed    Modified Rankin (Stroke Patients Only)       Balance                                            Cognition Arousal: Alert Behavior During Therapy: WFL for tasks assessed/performed Overall Cognitive Status: Within Functional Limits for tasks assessed                                          Exercises General Exercises - Lower Extremity Ankle Circles/Pumps: AROM, Both, 20 reps, Supine Quad Sets: Strengthening, Left, 20 reps (R 3 reps with increased pain so discontinued) Gluteal Sets: AROM, Strengthening, Both, 10 reps, Supine Short Arc Quad: AROM, Strengthening, Left, 10 reps, Supine Heel Slides: AROM, Left, 20 reps, Supine    General Comments        Pertinent Vitals/Pain Pain Assessment  Pain Assessment: 0-10 Pain Location: R knee Pain Descriptors / Indicators: Aching, Discomfort, Guarding Pain Intervention(s): Limited activity within patient's tolerance, Monitored during session    Home Living                          Prior Function            PT Goals (current goals can now be found in the care plan section) Progress towards PT goals: Progressing toward goals    Frequency    Min 1X/week      PT Plan      Co-evaluation   Reason for Co-Treatment: For patient/therapist safety;To address functional/ADL transfers PT goals addressed during session: Mobility/safety with mobility OT goals addressed during session: ADL's and self-care      AM-PAC PT "6 Clicks" Mobility   Outcome Measure  Help needed turning from your back to your side while in a flat bed without using bedrails?: A  Lot Help needed moving from lying on your back to sitting on the side of a flat bed without using bedrails?: A Lot Help needed moving to and from a bed to a chair (including a wheelchair)?: Total Help needed standing up from a chair using your arms (e.g., wheelchair or bedside chair)?: Total Help needed to walk in hospital room?: Total Help needed climbing 3-5 steps with a railing? : Total 6 Click Score: 8    End of Session Equipment Utilized During Treatment: Oxygen Activity Tolerance: Patient tolerated treatment well Patient left: in bed;with call bell/phone within reach Nurse Communication: Mobility status PT Visit Diagnosis: Unsteadiness on feet (R26.81);Other abnormalities of gait and mobility (R26.89);Repeated falls (R29.6);Muscle weakness (generalized) (M62.81);Difficulty in walking, not elsewhere classified (R26.2);Pain Pain - Right/Left: Right Pain - part of body: Knee;Leg     Time: 8413-2440 PT Time Calculation (min) (ACUTE ONLY): 18 min  Charges:    $Therapeutic Exercise: 8-22 mins PT General Charges $$ ACUTE PT VISIT: 1 Visit                     Tori Latasia Silberstein PT, DPT 02/14/23, 3:40 PM

## 2023-02-14 NOTE — Plan of Care (Signed)
  Problem: Education: Goal: Knowledge of General Education information will improve Description: Including pain rating scale, medication(s)/side effects and non-pharmacologic comfort measures Outcome: Progressing   Problem: Health Behavior/Discharge Planning: Goal: Ability to manage health-related needs will improve Outcome: Progressing   Problem: Clinical Measurements: Goal: Ability to maintain clinical measurements within normal limits will improve Outcome: Progressing Goal: Will remain free from infection Outcome: Progressing Goal: Diagnostic test results will improve Outcome: Progressing Goal: Respiratory complications will improve Outcome: Progressing Goal: Cardiovascular complication will be avoided Outcome: Progressing   Problem: Activity: Goal: Risk for activity intolerance will decrease Outcome: Progressing   Problem: Coping: Goal: Level of anxiety will decrease Outcome: Progressing   Problem: Elimination: Goal: Will not experience complications related to bowel motility Outcome: Progressing Goal: Will not experience complications related to urinary retention Outcome: Progressing   Problem: Pain Management: Goal: General experience of comfort will improve Outcome: Progressing   Problem: Safety: Goal: Ability to remain free from injury will improve Outcome: Progressing   Problem: Skin Integrity: Goal: Risk for impaired skin integrity will decrease Outcome: Progressing

## 2023-02-15 DIAGNOSIS — J9621 Acute and chronic respiratory failure with hypoxia: Secondary | ICD-10-CM | POA: Diagnosis not present

## 2023-02-15 DIAGNOSIS — J9622 Acute and chronic respiratory failure with hypercapnia: Secondary | ICD-10-CM | POA: Diagnosis not present

## 2023-02-15 MED ORDER — TRAMADOL HCL 50 MG PO TABS: 50.0000 mg | ORAL_TABLET | Freq: Two times a day (BID) | ORAL | 0 refills | Status: DC | PRN

## 2023-02-15 MED ORDER — IPRATROPIUM-ALBUTEROL 0.5-2.5 (3) MG/3ML IN SOLN
3.0000 mL | Freq: Two times a day (BID) | RESPIRATORY_TRACT | Status: DC
  Administered 2023-02-15 – 2023-02-16 (×3): 3 mL via RESPIRATORY_TRACT
  Filled 2023-02-15 (×3): qty 3

## 2023-02-15 MED ORDER — DOXEPIN HCL 50 MG PO CAPS: 50.0000 mg | ORAL_CAPSULE | Freq: Every morning | ORAL | 0 refills | Status: DC

## 2023-02-15 MED ORDER — IPRATROPIUM BROMIDE 0.02 % IN SOLN: 2.5000 mL | Freq: Four times a day (QID) | RESPIRATORY_TRACT | Status: DC

## 2023-02-15 MED ORDER — ACETAMINOPHEN 500 MG PO TABS: 1000.0000 mg | ORAL_TABLET | Freq: Three times a day (TID) | ORAL | 0 refills | Status: DC | PRN

## 2023-02-15 MED ORDER — DULOXETINE HCL 60 MG PO CPEP: 120.0000 mg | ORAL_CAPSULE | Freq: Every day | ORAL | 3 refills | Status: DC

## 2023-02-15 MED ORDER — IPRATROPIUM-ALBUTEROL 0.5-2.5 (3) MG/3ML IN SOLN: 3.0000 mL | Freq: Two times a day (BID) | RESPIRATORY_TRACT | Status: DC

## 2023-02-15 MED ORDER — BUSPIRONE HCL 5 MG PO TABS: 5.0000 mg | ORAL_TABLET | Freq: Three times a day (TID) | ORAL | 0 refills | Status: DC

## 2023-02-15 MED ORDER — GABAPENTIN 400 MG PO CAPS: 400.0000 mg | ORAL_CAPSULE | Freq: Every day | ORAL | 0 refills | Status: DC

## 2023-02-15 MED ORDER — CAPSAICIN 0.075 % EX CREA: TOPICAL_CREAM | Freq: Two times a day (BID) | CUTANEOUS | 0 refills | Status: DC

## 2023-02-15 MED ORDER — HYDROCHLOROTHIAZIDE 12.5 MG PO TABS: 12.5000 mg | ORAL_TABLET | Freq: Every day | ORAL | 0 refills | Status: DC

## 2023-02-15 NOTE — TOC Progression Note (Signed)
Transition of Care [facility]) - Progression Note    Patient Details  Name: Janice Ryan MRN: [id number] Date of Birth: 15-Jul-1955  Transition of Care [facility]) CM/SW Contact  Larrie Kass, LCSW Phone Number: 02/15/2023, 9:59 AM  Clinical Narrative:     Insurance auth Pending. TOC to follow   Expected Discharge Plan: Skilled Nursing Facility Barriers to Discharge: Continued Medical Work up  Expected Discharge Plan and Services In-house Referral: NA Discharge Planning Services: CM Consult Post Acute Care Choice: NA Living arrangements for the past 2 months: Apartment                 DME Arranged: N/A DME Agency: NA       HH Arranged: NA HH Agency: NA         Social Determinants of Health (SDOH) Interventions SDOH Screenings   Food Insecurity: No Food Insecurity (02/09/2023)  Housing: Low Risk  (02/09/2023)  Transportation Needs: Unmet Transportation Needs (02/09/2023)  Utilities: Not At Risk (02/09/2023)  Depression (PHQ2-9): Low Risk  (07/06/2021)  Social Connections: Unknown (10/10/2022)   Received from [facility], [facility]  Tobacco Use: Low Risk  (02/08/2023)    Readmission Risk Interventions    02/08/2023    4:56 PM  Readmission Risk Prevention Plan  Post Dischage Appt Complete  Medication Screening Complete  Transportation Screening Complete

## 2023-02-15 NOTE — Progress Notes (Signed)
[facility] arrived around 2020 but pt refused to leave the hospital. This RN explained, but pt still refused to leave and wanted to talk to Dr. in the morning. [facility] also brought a regular stretcher (pt needs a bariatric stretcher), so they were unable to transport the pt to the facility. CN was made aware. [facility] will need to be called again in the morning for transportation. This will be reported to the oncoming nurse in the morning.

## 2023-02-15 NOTE — Discharge Summary (Addendum)
Physician Discharge Summary   Patient: Janice Ryan MRN: [id number] DOB: 1956/03/25  Admit date:     02/07/2023  Discharge date: 02/15/23  Discharge Physician: Alba Cory   PCP: [name], NP   Recommendations at discharge:    Continue to use Trilogy Needs follow up with [facility] with Dr. [name] 10-14 days after admission  Discharge Diagnoses: Principal Problem:   Acute on chronic respiratory failure with hypoxia and hypercapnia (HCC) Active Problems:   Fall at home, initial encounter   Chronic pain of right knee   OSA (obstructive sleep apnea)   Obesity hypoventilation syndrome (HCC)   Asthma  Resolved Problems:   * No resolved hospital problems. *  Hospital Course: 67 year old with past medical history significant for super morbid obesity, restrictive and obstructive lung disease, chronic respiratory failure on home oxygen, supposed to be on BiPAP/trilogy vent, previous issues with obtaining this as she is uninsured, hypertension, depression, questionable pancreatic inflammation, colon inflammation on CTA chest 2020, needs follow up. 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  Knee MRI shows severe Rupture MCL   Assessment and Plan: 1-Acute on chronic  hypoxic/Hypercapnic respiratory failure Acute metabolic  encephalopathy in the setting of acute hypoxic hypercapnic respiratory failure She require 5 L of oxygen at baseline.  In the setting of pain medication that she received in the ED This has resolved with BIPAP.  Continue with BIPAP at HS.    Severe avulsion  of right knee MCL Accidental fall with severe right knee pain X-ray shows severe arthritis  MRI shows severe avulsion of right knee MCL  Case was discussed with Dr. He with who recommended knee immobilizer with mobility and follow-up with [facility] with Dr. [name] 10 to 14 days after admission Continue with gabapentin, Tylenol.Cymbalta  Avoid titration of opioids to avoid encephalopathy   Right hip pain: CT of the hip with no acute osseous abnormality.  Moderate osteoarthritis of the hips   Restrictive and obstructive lung disease Continue with trilogy vent   Depression: continue BuSpar Cymbalta   Hypertension: Continue to hold Lisinopril due to AKI.  Monitor renal function on hydrochlorothiazide>      AKI: In setting of NSAIDs and lisinopril.  Received IV fluids.  Renal function improved   Super morbid obesity: Needs life style modification         Consultants: [facility] Procedures performed: None Disposition: Skilled nursing facility Diet recommendation:  Discharge Diet Orders (From admission, onward)     Start     Ordered   02/15/23 0000  Diet - low sodium heart healthy        02/15/23 1058           Carb modified diet DISCHARGE MEDICATION: Allergies as of 02/15/2023       Reactions   Neurontin [gabapentin] Other (See Comments)   Has un-controlled movement of her body, excessive dribble of saliva at night   Sulfa Antibiotics Nausea And Vomiting, Other (See Comments)   Stomach pain        Medication List     STOP taking these medications    amoxicillin-clavulanate 875-125 MG tablet Commonly known as: AUGMENTIN   amphetamine-dextroamphetamine 20 MG 24 hr capsule Commonly known as: ADDERALL XR   lisinopril  20 MG tablet Commonly known as: ZESTRIL   meloxicam 15 MG tablet Commonly known as: MOBIC   mirtazapine 15 MG tablet Commonly known as: REMERON   PREVACID PO       TAKE these medications    acetaminophen 500 MG tablet Commonly known as: TYLENOL Take 2 tablets (1,000 mg total) by mouth every 8 (eight) hours as needed for mild pain (pain score 1-3) or  headache. What changed:  when to take this reasons to take this   albuterol 108 (90 Base) MCG/ACT inhaler Commonly known as: VENTOLIN HFA Inhale 2 puffs into the lungs every 6 (six) hours as needed for wheezing or shortness of breath. What changed: when to take this   albuterol (2.5 MG/3ML) 0.083% nebulizer solution Commonly known as: PROVENTIL Take 3 mLs (2.5 mg total) by nebulization every 6 (six) hours as needed for wheezing or shortness of breath. What changed: Another medication with the same name was changed. Make sure you understand how and when to take each.   busPIRone 5 MG tablet Commonly known as: BUSPAR Take 1 tablet (5 mg total) by mouth 3 (three) times daily. What changed: Another medication with the same name was removed. Continue taking this medication, and follow the directions you see here.   capsicum 0.075 % topical cream Commonly known as: ZOSTRIX Apply topically 2 (two) times daily.   carbamide peroxide 6.5 % OTIC solution Commonly known as: DEBROX Place 3-4 drops into both ears as needed (to clean ears).   cetirizine 10 MG tablet Commonly known as: ZyrTEC Allergy Take 1 tablet (10 mg total) by mouth daily.   diclofenac Sodium 1 % Gel Commonly known as: Voltaren Apply 2 g topically 4 (four) times daily as needed. What changed:  how much to take when to take this reasons to take this   doxepin 50 MG capsule Commonly known as: SINEQUAN Take 1 capsule (50 mg total) by mouth in the morning.   DULoxetine 60 MG capsule Commonly known as: CYMBALTA Take 2 capsules (120 mg total) by mouth daily. What changed: how much to take   fluticasone 50 MCG/ACT nasal spray Commonly known as: FLONASE Place 2 sprays into both nostrils daily. What changed:  when to take this reasons to take this   fluticasone-salmeterol 500-50 MCG/ACT Aepb Commonly known as: ADVAIR Inhale 1 puff into the lungs in the morning and at bedtime. What changed:  how much to  take when to take this   gabapentin 400 MG capsule Commonly known as: NEURONTIN Take 1 capsule (400 mg total) by mouth at bedtime.   hydrochlorothiazide 12.5 MG tablet Commonly known as: HYDRODIURIL Take 1 tablet (12.5 mg total) by mouth daily.   montelukast 10 MG tablet Commonly known as: SINGULAIR Take 1 tablet (10 mg total) by mouth at bedtime.   multivitamin with minerals Tabs tablet Take 1 tablet by mouth daily.   OMEGA-3 PO Take 1 capsule by mouth daily.   OXYGEN Inhale 5 L into the lungs continuous.   traMADol 50 MG tablet Commonly known as: ULTRAM Take 1 tablet (50 mg total) by mouth every 12 (twelve) hours as needed for severe pain (pain score 7-10). What changed:  when to take this reasons to take this        Discharge Exam: Filed Weights   02/08/23 1033  Weight: (!) 193 kg   General; NAD  Condition at discharge: stable  The results of significant diagnostics from this hospitalization (including imaging, microbiology, ancillary and laboratory) are  listed below for reference.   Imaging Studies: MR KNEE RIGHT WO CONTRAST  Result Date: 02/11/2023 CLINICAL DATA:  Larey Seat.  Right knee pain. EXAM: MRI OF THE RIGHT KNEE WITHOUT CONTRAST TECHNIQUE: Multiplanar, multisequence MR imaging of the knee was performed. No intravenous contrast was administered. COMPARISON:  Radiographs 02/07/2023 FINDINGS: Examination is limited by body habitus and motion artifact. MENISCI Medial meniscus: Severely degenerated and extensively torn. The meniscus is displaced medially suggesting a large radial tear, not well demonstrated. Lateral meniscus:  Intact LIGAMENTS Cruciates: Suspect chronic ACL deficient knee versus severe mucoid degeneration. The PCL is intact. Collaterals:  Intact CARTILAGE Patellofemoral: Severe degenerative changes with full-thickness cartilage loss, joint space narrowing and osteophytic spurring. Medial: Severe degenerative changes with bone-on-bone appearance,  osteophytic spurring and subchondral cystic change. Lateral: Moderate degenerative chondrosis with moderate spurring changes. Joint: Moderate-sized joint effusion and moderate synovitis. Loose ossified bodies in the joint. Popliteal Fossa:  Moderate-sized leaking Baker's cyst. Extensor Mechanism: The patella retinacular structures are intact and the quadriceps patellar tendons are intact. Bones:  No acute bony findings.  No fracture. Other: Unremarkable knee musculature. IMPRESSION: 1. Severely degenerated and extensively torn medial meniscus. 2. Suspect chronic ACL deficient knee versus severe mucoid degeneration. 3. Intact PCL and collateral ligaments. 4. Severe tricompartmental degenerative changes. 5. Moderate-sized joint effusion and moderate synovitis. Loose ossified bodies in the joint. 6. Moderate-sized leaking Baker's cyst. Electronically Signed   By: Rudie Meyer M.D.   On: 02/11/2023 12:04   CT Angio Chest PE W and/or Wo Contrast  Result Date: 02/07/2023 CLINICAL DATA:  Concern for pulmonary embolism. EXAM: CT ANGIOGRAPHY CHEST WITH CONTRAST TECHNIQUE: Multidetector CT imaging of the chest was performed using the standard protocol during bolus administration of intravenous contrast. Multiplanar CT image reconstructions and MIPs were obtained to evaluate the vascular anatomy. RADIATION DOSE REDUCTION: This exam was performed according to the departmental dose-optimization program which includes automated exposure control, adjustment of the mA and/or kV according to patient size and/or use of iterative reconstruction technique. CONTRAST:  OMNIPAQUE IOHEXOL 350 MG/ML SOLN COMPARISON:  Chest CT dated 03/22/2019. FINDINGS: Cardiovascular: Top-normal cardiac size. No pericardial effusion. There is lipomatous hypertrophy of the interatrial septum. Mild atherosclerotic calcification of the thoracic aorta. No aneurysmal dilatation or dissection. The origins of the great vessels of the aortic arch appear  patent. There is dilatation of the main pulmonary trunk suggestive of pulmonary hypertension. No pulmonary artery embolus identified Mediastinum/Nodes: No hilar or mediastinal adenopathy. The esophagus is grossly unremarkable. Stable 2.5 cm rounded lesion in the anterior mediastinum, likely a benign process. No mediastinal fluid collection. Lungs/Pleura: Right lung base linear atelectasis/scarring. No focal consolidation, pleural effusion, or pneumothorax. The central airways are patent. Upper Abdomen: Noncalcified gallstones. Musculoskeletal: Osteopenia with degenerative changes. No acute osseous pathology. Review of the MIP images confirms the above findings. IMPRESSION: 1. No acute intrathoracic pathology. No CT evidence of pulmonary embolism. 2. Dilatation of the main pulmonary trunk suggestive of pulmonary hypertension. 3. Cholelithiasis. 4.  Aortic Atherosclerosis (ICD10-I70.0). Electronically Signed   By: Elgie Collard M.D.   On: 02/07/2023 20:54   DG Chest Port 1 View  Result Date: 02/07/2023 CLINICAL DATA:  Shortness of breath EXAM: PORTABLE CHEST 1 VIEW COMPARISON:  01/23/2022 FINDINGS: Cardiomegaly. No frank interstitial edema. No pleural effusion or pneumothorax. IMPRESSION: Cardiomegaly. No frank interstitial edema. Electronically Signed   By: Charline Bills M.D.   On: 02/07/2023 19:19   DG Tibia/Fibula Right  Result Date: 02/07/2023 CLINICAL DATA:  Larey Seat when washing  her hair.  Pain. EXAM: PORTABLE RIGHT KNEE - 1-2 VIEW; RIGHT TIBIA AND FIBULA - 2 VIEW COMPARISON:  Right knee radiographs 01/20/2022, right tibia and fibula radiographs 08/25/2021 FINDINGS: Right knee: There is diffuse decreased bone mineralization. Bone-on-bone contact of the medial compartment. Large peripheral medial degenerative osteophytes. Moderate peripheral lateral compartment degenerative osteophytosis without joint space narrowing. Degenerative genu varum again noted. Severe patellofemoral joint space narrowing  bone-on-bone contact and large peripheral osteophytes. There is an impaction of 14 mm ossicle within the suprapatellar joint space. Right tibia and fibula: There is a 6 mm chronic well corticated ossicle distal to the fibula, likely the sequela of remote trauma. Mild medial greater than lateral tibiotalar joint space narrowing. Mild distal anterior tibial plafond degenerative osteophytosis. Mild navicular-cuneiform joint space narrowing and dorsal osteophytosis on lateral view. No acute fracture or dislocation. IMPRESSION: 1. Severe medial and patellofemoral compartment osteoarthritis, similar to prior. 2. Mild tibiotalar and navicular-cuneiform osteoarthritis. 3. No acute fracture. Electronically Signed   By: Neita [name] M.D.   On: 02/07/2023 09:33   DG Knee Right Port  Result Date: 02/07/2023 CLINICAL DATA:  Larey Seat when washing her hair.  Pain. EXAM: PORTABLE RIGHT KNEE - 1-2 VIEW; RIGHT TIBIA AND FIBULA - 2 VIEW COMPARISON:  Right knee radiographs 01/20/2022, right tibia and fibula radiographs 08/25/2021 FINDINGS: Right knee: There is diffuse decreased bone mineralization. Bone-on-bone contact of the medial compartment. Large peripheral medial degenerative osteophytes. Moderate peripheral lateral compartment degenerative osteophytosis without joint space narrowing. Degenerative genu varum again noted. Severe patellofemoral joint space narrowing bone-on-bone contact and large peripheral osteophytes. There is an impaction of 14 mm ossicle within the suprapatellar joint space. Right tibia and fibula: There is a 6 mm chronic well corticated ossicle distal to the fibula, likely the sequela of remote trauma. Mild medial greater than lateral tibiotalar joint space narrowing. Mild distal anterior tibial plafond degenerative osteophytosis. Mild navicular-cuneiform joint space narrowing and dorsal osteophytosis on lateral view. No acute fracture or dislocation. IMPRESSION: 1. Severe medial and patellofemoral  compartment osteoarthritis, similar to prior. 2. Mild tibiotalar and navicular-cuneiform osteoarthritis. 3. No acute fracture. Electronically Signed   By: Neita [name] M.D.   On: 02/07/2023 09:33   CT PELVIS WO CONTRAST  Result Date: 02/07/2023 CLINICAL DATA:  fall EXAM: CT PELVIS WITHOUT CONTRAST TECHNIQUE: Multidetector CT imaging of the pelvis was performed following the standard protocol without intravenous contrast. RADIATION DOSE REDUCTION: This exam was performed according to the departmental dose-optimization program which includes automated exposure control, adjustment of the mA and/or kV according to patient size and/or use of iterative reconstruction technique. COMPARISON:  None Available. FINDINGS: Urinary Tract:  No abnormality visualized. Bowel:  Unremarkable visualized pelvic bowel loops. Vascular/Lymphatic: No pathologically enlarged lymph nodes. No significant vascular abnormality seen. Reproductive:  Fibroid uterus. Musculoskeletal: No acute osseous abnormality. The bilateral femoral heads are seated in the acetabula. Degenerative changes of the bilateral hips with superolateral joint space narrowing, marginal osteophytosis and subchondral cystic changes. The sacroiliac joints and pubic symphysis are anatomically aligned with degenerative changes. Multilevel degenerative changes of the visualized lower lumbar spine. IMPRESSION: 1. No acute osseous abnormality. 2. Moderate osteoarthritis of the hips. Electronically Signed   By: Hart Robinsons M.D.   On: 02/07/2023 09:14   CT Thoracic Spine Wo Contrast  Result Date: 02/07/2023 CLINICAL DATA:  Provided history: Fall.  Mid back pain.  Leg pain. EXAM: CT THORACIC SPINE WITHOUT CONTRAST TECHNIQUE: Multidetector CT images of the thoracic were obtained using the standard protocol without  intravenous contrast. RADIATION DOSE REDUCTION: This exam was performed according to the departmental dose-optimization program which includes automated  exposure control, adjustment of the mA and/or kV according to patient size and/or use of iterative reconstruction technique. COMPARISON:  Chest CT 03/22/2019. FINDINGS: Alignment: No significant spondylolisthesis. Vertebrae: Mild chronic anterior wedge deformity of the T7 vertebral body, unchanged from the prior chest CT of 03/22/2019. Vertebral body height is maintained at the remaining thoracic levels. No evidence of an acute fracture to the thoracic spine. Osseous fusion across the disc space at T8-T9. Early osseous fusion also suspected across the T9-T10 disc space. Ventrolateral osteophytes at T6-T7, T7-T8, T8-T9 and T9-T10. Hemangiomas within the T3 and T5 vertebrae. Paraspinal and other soft tissues: No acute finding within included portions of the thorax or upper abdomen/retroperitoneum. Aortic atherosclerosis. No paraspinal mass or collection. Disc levels: Osseous fusion across the disc space at T8-T9. Early osseous fusion also suspected across the T9-T10 disc space. Disc space narrowing elsewhere within the thoracic spine, greatest at T7-T8 (moderate-to-advanced at this level). No significant spinal canal stenosis is appreciated. Endplate spurring results in bilateral bony neural foraminal narrowing at T9-T10 (moderate right, mild left). IMPRESSION: 1. No evidence of an acute thoracic spine fracture. 2. Mild chronic anterior wedge deformity of the T7 vertebral body, unchanged from the prior chest CT of 03/22/2019. 3. Thoracic spondylosis as described. No significant spinal canal stenosis is appreciated. Endplate spurring results in bilateral bony neural foraminal narrowing at T9-T10 (moderate right, mild left). Osseous fusion across the T8-T9 disc space. Early osseous fusion also suspected across the T9-T10 disc space. Electronically Signed   By: Jackey Loge D.O.   On: 02/07/2023 08:32   CT Lumbar Spine Wo Contrast  Result Date: 02/07/2023 CLINICAL DATA:  67 year old female status post fall with pain.  EXAM: CT LUMBAR SPINE WITHOUT CONTRAST TECHNIQUE: Multidetector CT imaging of the lumbar spine was performed without intravenous contrast administration. Multiplanar CT image reconstructions were also generated. RADIATION DOSE REDUCTION: This exam was performed according to the departmental dose-optimization program which includes automated exposure control, adjustment of the mA and/or kV according to patient size and/or use of iterative reconstruction technique. COMPARISON:  CT cervical and thoracic Spine today reported separately. 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 Correlation with radiographs is recommended prior to any operative intervention. Alignment: Mildly exaggerated lumbar lordosis. No significant scoliosis or spondylolisthesis. Vertebrae: Thoracic spine reported separately. Lumbar vertebrae appear intact. L3 vertebral body benign hemangioma (normal variant). Degenerative endplate changes L2-L3 through L5-S1. Visible sacrum and SI joints appear intact with degenerative vacuum SI joint phenomena. Paraspinal and other soft tissues: Costophrenic angle respiratory motion. Negative visible noncontrast abdominal viscera. Calcified iliac artery atherosclerosis. Lumbar paraspinal soft tissues are within normal limits. Disc levels: Mild for age spine degeneration T12-L1 and L1-L2. L2-L3: Disc space loss and vacuum disc. Circumferential disc bulge. Mild to moderate facet and ligament flavum hypertrophy. Vacuum facet on the right. Mild spinal stenosis suspected. Mild L2 foraminal stenosis. L3-L4: Similar vacuum disc. Moderate to severe facet and ligament flavum hypertrophy and vacuum facet on the right. Circumferential disc bulge contributing to mild to moderate spinal stenosis at this level. Mild to moderate left, mild right L3 foraminal stenosis. L4-L5: Less pronounced vacuum disc. Circumferential disc bulge. Moderate to severe facet  and ligament flavum hypertrophy. Vacuum facet on the right. Mild to moderate spinal stenosis suspected. Severe bilateral L4 neural foraminal stenosis. L5-S1:  Sacralized and negative. IMPRESSION: 1. 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 Correlation with radiographs is recommended prior to any operative intervention. 2. No acute traumatic injury identified in the Lumbar Spine. 3. 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 Electronically Signed   By: Odessa Fleming M.D.   On: 02/07/2023 08:28   CT Head Wo Contrast  Result Date: 02/07/2023 CLINICAL DATA:  Provided history: Polytrauma, blunt. Fall. Back pain. Leg pain. EXAM: CT HEAD WITHOUT CONTRAST CT CERVICAL SPINE WITHOUT CONTRAST TECHNIQUE: Multidetector CT imaging of the head and cervical spine was performed following the standard protocol without intravenous contrast. Multiplanar CT image reconstructions of the cervical spine were also generated. RADIATION DOSE REDUCTION: This exam was performed according to the departmental dose-optimization program which includes automated exposure control, adjustment of the mA and/or kV according to patient size and/or use of iterative reconstruction technique. COMPARISON:  None. FINDINGS: CT HEAD FINDINGS Brain: No age advanced or lobar predominant parenchymal atrophy. Mild-to-moderate patchy and ill-defined hypoattenuation within the cerebral white matter. There are a few small nonspecific calcifications scattered within the bilateral cerebral hemispheres. There is no acute intracranial hemorrhage. No demarcated cortical infarct. No extra-axial fluid collection. No evidence of an intracranial mass. No midline shift. Vascular: No hyperdense vessel.  Atherosclerotic calcifications. Skull: No calvarial fracture or aggressive osseous lesion. Sinuses/Orbits: No mass or acute  finding within the imaged orbits. No significant paranasal sinus disease at the imaged levels. CT CERVICAL SPINE FINDINGS Alignment: Straightening of the expected cervical lordosis. No significant spondylolisthesis. Skull base and vertebrae: The basion-dental and atlanto-dental intervals are maintained.No evidence of acute fracture to the cervical spine. Soft tissues and spinal canal: No prevertebral fluid or swelling. No visible canal hematoma. Disc levels: Cervical spondylosis with multilevel disc space narrowing, disc bulges/central disc protrusions, posterior disc osteophyte complexes and uncovertebral hypertrophy. Disc space narrowing is greatest at C4-C5 and C5-C6 (advanced at these levels). Multilevel spinal canal stenosis. Most notably at C5-C6, a posterior disc osteophyte complex contributes to spinal canal stenosis which appears moderate in severity. Multilevel bony neural foraminal narrowing. Upper chest: No consolidation within the imaged lung apices. No visible pneumothorax. IMPRESSION: CT head: 1. No evidence of an acute intracranial abnormality. 2. Mild-to-moderate, nonspecific cerebral white matter disease. CT cervical spine: 1. No evidence of an acute cervical spine fracture. 2. Nonspecific straightening of the expected cervical lordosis. 3. Cervical spondylosis as described. Electronically Signed   By: Jackey Loge D.O.   On: 02/07/2023 08:17   CT Cervical Spine Wo Contrast  Result Date: 02/07/2023 CLINICAL DATA:  Provided history: Polytrauma, blunt. Fall. Back pain. Leg pain. EXAM: CT HEAD WITHOUT CONTRAST CT CERVICAL SPINE WITHOUT CONTRAST TECHNIQUE: Multidetector CT imaging of the head and cervical spine was performed following the standard protocol without intravenous contrast. Multiplanar CT image reconstructions of the cervical spine were also generated. RADIATION DOSE REDUCTION: This exam was performed according to the departmental dose-optimization program which includes automated  exposure control, adjustment of the mA and/or kV according to patient size and/or use of iterative reconstruction technique. COMPARISON:  None. FINDINGS: CT HEAD FINDINGS Brain: No age advanced or lobar predominant parenchymal atrophy. Mild-to-moderate patchy and ill-defined hypoattenuation within the cerebral white matter. There are a few small nonspecific calcifications scattered within the bilateral cerebral hemispheres. There is no acute intracranial hemorrhage. No demarcated cortical infarct. No extra-axial fluid collection. No evidence of an intracranial mass. No midline shift. Vascular: No  hyperdense vessel.  Atherosclerotic calcifications. Skull: No calvarial fracture or aggressive osseous lesion. Sinuses/Orbits: No mass or acute finding within the imaged orbits. No significant paranasal sinus disease at the imaged levels. CT CERVICAL SPINE FINDINGS Alignment: Straightening of the expected cervical lordosis. No significant spondylolisthesis. Skull base and vertebrae: The basion-dental and atlanto-dental intervals are maintained.No evidence of acute fracture to the cervical spine. Soft tissues and spinal canal: No prevertebral fluid or swelling. No visible canal hematoma. Disc levels: Cervical spondylosis with multilevel disc space narrowing, disc bulges/central disc protrusions, posterior disc osteophyte complexes and uncovertebral hypertrophy. Disc space narrowing is greatest at C4-C5 and C5-C6 (advanced at these levels). Multilevel spinal canal stenosis. Most notably at C5-C6, a posterior disc osteophyte complex contributes to spinal canal stenosis which appears moderate in severity. Multilevel bony neural foraminal narrowing. Upper chest: No consolidation within the imaged lung apices. No visible pneumothorax. IMPRESSION: CT head: 1. No evidence of an acute intracranial abnormality. 2. Mild-to-moderate, nonspecific cerebral white matter disease. CT cervical spine: 1. No evidence of an acute cervical spine  fracture. 2. Nonspecific straightening of the expected cervical lordosis. 3. Cervical spondylosis as described. Electronically Signed   By: Jackey Loge D.O.   On: 02/07/2023 08:17    Microbiology: Results for orders placed or performed during the hospital encounter of 02/07/23  MRSA Next Gen by PCR, Nasal     Status: None   Collection Time: 02/08/23 10:52 AM   Specimen: Nasal Mucosa; Nasal Swab  Result Value Ref Range Status   MRSA by PCR Next Gen NOT DETECTED NOT DETECTED Final    Comment: (NOTE) The GeneXpert MRSA Assay (FDA approved for NASAL specimens only), is one component of a comprehensive MRSA colonization surveillance program. It is not intended to diagnose MRSA infection nor to guide or monitor treatment for MRSA infections. Test performance is not FDA approved in patients less than 83 years old. Performed at [facility], [address] [place], [place] [address]     Labs: CBC: Recent Labs  Lab 02/09/23 0254 02/10/23 0446 02/11/23 0419 02/13/23 0421  WBC 10.3 8.5 10.9* 8.0  NEUTROABS 7.3 5.8 7.3 5.0  HGB 10.7* 10.9* 11.5* 9.6*  HCT 36.9 37.3 39.3 32.8*  MCV 97.6 96.9 98.0 97.3  PLT 220 265 276 231   Basic Metabolic Panel: Recent Labs  Lab 02/09/23 0254 02/10/23 0446 02/11/23 0419 02/13/23 0421 02/14/23 0407 02/14/23 0408  NA 136 136 137 135 135  --   K 3.9 4.2 4.6 4.5 4.8  --   CL 101 95* 97* 96* 94*  --   CO2 29 33* 31 31 30   --   GLUCOSE 112* 112* 114* 118* 101*  --   BUN 16 16 21  39* 38*  --   CREATININE 0.86 0.97 1.34* 1.89* 1.44* 1.39*  CALCIUM 9.7 10.1 11.0* 10.1 10.7*  --    Liver Function Tests: Recent Labs  Lab 02/09/23 0254 02/10/23 0446 02/11/23 0419  AST 10* 10* 11*  ALT 10 11 9   ALKPHOS 62 61 60  BILITOT 0.7 0.5 0.4  PROT 6.8 6.8 6.7  ALBUMIN 3.3* 3.3* 3.2*   CBG: No results for input(s): "GLUCAP" in the last 168 hours.  Discharge time spent: greater than 30 minutes.  Signed: Alba Cory,  MD [facility] 02/15/2023

## 2023-02-15 NOTE — TOC Transition Note (Signed)
Transition of Care [facility]) - CM/SW Discharge Note   Patient Details  Name: Janice Ryan MRN: [id number] Date of Birth: 1955-12-29  [facility] of [facility]) CM/SW Contact:  Larrie Kass, LCSW Phone Number: 02/15/2023, 4:33 PM   Clinical Narrative:    Pt to d/c to [facility] room 118, RN to call report to [phone]. PTAR called no further TOC needs , TOC Sign off.    Final next level of care: Skilled Nursing Facility Barriers to Discharge: No Barriers Identified   Patient Goals and CMS Choice CMS Medicare.gov Compare Post Acute Care list provided to:: Patient Choice offered to / list presented to : Patient  Discharge Placement                  Patient to be transferred to facility by: EMS   Patient and family notified of of transfer: 02/15/23  Discharge Plan and Services Additional resources added to the After Visit Summary for   In-house Referral: NA Discharge Planning Services: CM Consult Post Acute Care Choice: NA          DME Arranged: N/A DME Agency: NA       HH Arranged: NA HH Agency: NA        Social Determinants of Health (SDOH) Interventions SDOH Screenings   Food Insecurity: No Food Insecurity (02/09/2023)  Housing: Low Risk  (02/09/2023)  Transportation Needs: Unmet Transportation Needs (02/09/2023)  Utilities: Not At Risk (02/09/2023)  Depression (PHQ2-9): Low Risk  (07/06/2021)  Social Connections: Unknown (10/10/2022)   Received from [facility], [facility]  Tobacco Use: Low Risk  (02/08/2023)     Readmission Risk Interventions    02/08/2023    4:56 PM  Readmission Risk Prevention Plan  Post Dischage Appt Complete  Medication Screening Complete  Transportation Screening Complete

## 2023-02-15 NOTE — Plan of Care (Signed)
  Problem: Education: Goal: Knowledge of General Education information will improve Description: Including pain rating scale, medication(s)/side effects and non-pharmacologic comfort measures Outcome: Completed/Met   Problem: Health Behavior/Discharge Planning: Goal: Ability to manage health-related needs will improve Outcome: Completed/Met   Problem: Clinical Measurements: Goal: Ability to maintain clinical measurements within normal limits will improve Outcome: Completed/Met Goal: Will remain free from infection Outcome: Completed/Met Goal: Diagnostic test results will improve Outcome: Completed/Met Goal: Respiratory complications will improve Outcome: Completed/Met Goal: Cardiovascular complication will be avoided Outcome: Completed/Met   Problem: Activity: Goal: Risk for activity intolerance will decrease Outcome: Completed/Met   Problem: Coping: Goal: Level of anxiety will decrease Outcome: Completed/Met   Problem: Elimination: Goal: Will not experience complications related to bowel motility Outcome: Completed/Met Goal: Will not experience complications related to urinary retention Outcome: Completed/Met   Problem: Pain Management: Goal: General experience of comfort will improve Outcome: Completed/Met   Problem: Safety: Goal: Ability to remain free from injury will improve Outcome: Completed/Met   Problem: Skin Integrity: Goal: Risk for impaired skin integrity will decrease Outcome: Completed/Met

## 2023-02-15 NOTE — Progress Notes (Signed)
   02/15/23 2200  BiPAP/CPAP/SIPAP  BiPAP/CPAP/SIPAP Pt Type Adult  BiPAP/CPAP/SIPAP V60  Mask Type Full face mask  Mask Size Large  Set Rate 14 breaths/min  Respiratory Rate 16 breaths/min  IPAP 14 cmH20  EPAP 8 cmH2O  FiO2 (%) 40 %  Minute Ventilation 7.2  Leak 5  Peak Inspiratory Pressure (PIP) 14  Tidal Volume (Vt) 496  Patient Home Equipment No  Auto Titrate No  Press High Alarm 30 cmH2O  Press Low Alarm 5 cmH2O  CPAP/SIPAP surface wiped down Yes  Oxygen Percent 40 %   Pt. placed on BiPAP V-60 at this time, stated that she was possibly being dc'd this shift, RT to follow on BiPAP checks.

## 2023-02-16 NOTE — Progress Notes (Signed)
Report called to [place], RN [facility].

## 2023-02-16 NOTE — Care Management Important Message (Signed)
Important Message  Patient Details  Name: Janice Ryan MRN: [id number] Date of Birth: 1955-05-18   Important Message Given:  Yes - Medicare IM     Corey Harold 02/16/2023, 12:58 PM

## 2023-02-16 NOTE — Progress Notes (Signed)
PT Cancellation Note  Patient Details Name: Janice Ryan MRN: [id number] DOB: 08/20/1955   Cancelled Treatment:     RN reported "Pt discharging today to a facility today".     Armando Reichert 02/16/2023, 2:38 PM

## 2023-02-16 NOTE — Progress Notes (Signed)
Patient transport to facility by [facility].

## 2023-02-16 NOTE — Consult Note (Signed)
Janice Ryan is an 67 y.o. female.    Chief Complaint:  right knee pain  HPI: 67 y/o female c/o severe right knee pain for many years. She states she has been wheel chair bound for the last 3 years. Currently she is around 425 lbs and states she used to be above 500. C/o severe pain with weight bearing, standing and rom in the medial right knee. History of left knee surgery in 2014. Currently on 2-5 liters of oxygen to maintain appropriate sats. Would like to discuss options for pain management and treatment for her right knee  PCP:  [name], NP  PMH: Past Medical History:  Diagnosis Date   Acute respiratory failure with hypoxia (HCC)    Asthma    Hypertension    Morbid obesity (HCC)    Obesity with alveolar hypoventilation and body mass index (BMI) of 40 or greater (HCC)    OSA (obstructive sleep apnea)     PSes:  Allergies  Allergen Reactions   Neurontin [Gabapentin] Other (See Comments)    Has un-controlled movement of her body, excessive dribble of saliva at night   Sulfa Antibiotics Nausea And Vomiting and Other (See Comments)    Stomach pain    Medications: Current Facility-Administered Medications  Medication Dose Route Frequency Provider Last Rate Last Admin   acetaminophen (TYLENOL) tablet 1,000 mg  1,000 mg Oral Q8H PRN Rhetta Mura, MD   1,000 mg at 02/15/23 2100   albuterol (PROVENTIL) (2.5 MG/3ML) 0.083% nebulizer solution 2.5 mg  2.5 mg Nebulization Q6H PRN Rhetta Mura, MD   2.5 mg at 02/13/23 0800   busPIRone (BUSPAR) tablet 5 mg  5 mg Oral TID Rhetta Mura, MD   5 mg at 02/16/23 1005   capsicum (ZOSTRIX) 0.075 % cream   Topical BID Rhetta Mura, MD   Given at 02/16/23 1006   diclofenac Sodium (VOLTAREN) 1 % topical gel 2 g  2 g Topical QID PRN Rhetta Mura, MD   2 g at 02/09/23 1303   doxepin (SINEQUAN) capsule 50 mg  50 mg Oral Daily Rhetta Mura, MD   50 mg at 02/16/23 1005   DULoxetine  (CYMBALTA) DR capsule 120 mg  120 mg Oral Daily Rhetta Mura, MD   120 mg at 02/16/23 1005   enoxaparin (LOVENOX) injection 100 mg  100 mg Subcutaneous Daily Rhetta Mura, MD   100 mg at 02/16/23 1005   fluticasone (FLONASE) 50 MCG/ACT nasal spray 2 spray  2 spray Each Nare Daily PRN Rhetta Mura, MD   2 spray at 02/12/23 2225   gabapentin (NEURONTIN) capsule 400 mg  400 mg Oral QHS Rhetta Mura, MD   400 mg at 02/15/23 2100   Gerhardt's butt cream   Topical BID Anthoney Harada, NP   1 Application at 02/16/23 1006   hydrochlorothiazide (HYDRODIURIL) tablet 12.5 mg  12.5 mg Oral Daily Rhetta Mura, MD   12.5 mg at 02/16/23 1005   ipratropium-albuterol (DUONEB) 0.5-2.5 (3) MG/3ML nebulizer solution 3 mL  3 mL Nebulization BID Regalado, Belkys A, MD   3 mL at 02/16/23 8657   mometasone-formoterol (DULERA) 200-5 MCG/ACT inhaler 2 puff  2 puff Inhalation BID Rhetta Mura, MD   2 puff at 02/16/23 0823   montelukast (SINGULAIR) tablet 10 mg  10 mg Oral QHS Rhetta Mura, MD   10 mg at 02/15/23 2100   Oral care mouth rinse  15 mL Mouth Rinse 4 times per day Rhetta Mura, MD   15 mL  at 02/16/23 0719   Oral care mouth rinse  15 mL Mouth Rinse PRN Rhetta Mura, MD       polyvinyl alcohol (LIQUIFILM TEARS) 1.4 % ophthalmic solution 1 drop  1 drop Both Eyes PRN Rhetta Mura, MD   1 drop at 02/12/23 1417   traMADol (ULTRAM) tablet 50 mg  50 mg Oral Q12H PRN Rhetta Mura, MD   50 mg at 02/16/23 1005    No results found for this or any previous visit (from the past 48 hour(s)). No results found.  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 Currently her pain is caused from her bone on bone osteoarthritis, especially medially.  Due to her weight she is unable to have a knee replacement, which would be the definitive surgery for her condition. Discussed treatment options to include cortisone and/or lubrication injections. Would not recommend any type of knee arthroscopy to try and address MRI issues of meniscal extrusion. Recommend outpatient follow up for a trial injection that may not be effective and could also raise her blood sugar.  Gentle activity as tolerated and may assist as able with transfers.  Pain management per primary care team and her PCP   Alphonsa Overall PA-C, MPAS [facility] is now [facility]  [place] [address], [place], [place] [address] Phone: [phone] [email or web address] Facebook  [employer]

## 2023-02-16 NOTE — TOC Transition Note (Signed)
Transition of Care [facility]) - CM/SW Discharge Note   Patient Details  Name: Janice Ryan MRN: [id number] Date of Birth: 03-Sep-1955  Transition of Care [facility]) CM/SW Contact:  Larrie Kass, LCSW Phone Number: 02/16/2023, 11:02 AM   Clinical Narrative:    CSW received a message from MD, pt is requesting to speak with TOC about changing facilities.   CSW met with pt at the bedside, and she expressed her concerns about [facility]. CSW reminded pt that [facility] was the only facility that [facility] could find for her. PT inquired about [facility]  [facility], and CSW informed her that she was not a candidate for [facility] as it is not PT's recommendation. CSW reminded her that PT recommended her for short-term rehab. CSW explained that she must return home if she does not want to d/c to the facility. The pt then called her son to see if he could assist her in the home; pt's son stated he could not help. CSW explained that if she does not wish to leave the hospital today, she will have to start the appeal process. The pt is awaiting Ortho consult at 2 pm today.    Final next level of care: Skilled Nursing Facility Barriers to Discharge: No Barriers Identified   Patient Goals and CMS Choice CMS Medicare.gov Compare Post Acute Care list provided to:: Patient Choice offered to / list presented to : Patient  Discharge Placement                  Patient to be transferred to facility by: EMS   Patient and family notified of of transfer: 02/15/23  Discharge Plan and Services Additional resources added to the After Visit Summary for   In-house Referral: NA Discharge Planning Services: CM Consult Post Acute Care Choice: NA          DME Arranged: N/A DME Agency: NA       HH Arranged: NA HH Agency: NA        Social Determinants of Health (SDOH) Interventions SDOH Screenings   Food Insecurity: No Food Insecurity (02/09/2023)  Housing: Low Risk  (02/09/2023)  Transportation  Needs: Unmet Transportation Needs (02/09/2023)  Utilities: Not At Risk (02/09/2023)  Depression (PHQ2-9): Low Risk  (07/06/2021)  Social Connections: Unknown (10/10/2022)   Received from [facility], [facility]  Tobacco Use: Low Risk  (02/08/2023)     Readmission Risk Interventions    02/08/2023    4:56 PM  Readmission Risk Prevention Plan  Post Dischage Appt Complete  Medication Screening Complete  Transportation Screening Complete

## 2023-02-16 NOTE — Discharge Summary (Addendum)
Physician Discharge Summary   Patient: Janice Ryan MRN: [id number] DOB: 06-30-1955  Admit date:     02/07/2023  Discharge date: 02/16/23  Discharge Physician: Alba Cory   PCP: [name], NP   Recommendations at discharge:    Continue to use Trilogy Needs follow up with [facility] with Dr. [name] 10-14 days after admission Needs follow up with Pulmonologist for pre op evaluation. If she requires knee sx  Discharge Diagnoses: Principal Problem:   Acute on chronic respiratory failure with hypoxia and hypercapnia (HCC) Active Problems:   Fall at home, initial encounter   Chronic pain of right knee   OSA (obstructive sleep apnea)   Obesity hypoventilation syndrome (HCC)   Asthma  Resolved Problems:   * No resolved hospital problems. *  Hospital Course: 67 year old with past medical history significant for super morbid obesity, restrictive and obstructive lung disease, chronic respiratory failure on home oxygen, supposed to be on BiPAP/trilogy vent, previous issues with obtaining this as she is uninsured, hypertension, depression, questionable pancreatic inflammation, colon inflammation on CTA chest 2020, needs follow up. 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  Knee MRI shows severe Rupture MCL   Assessment and Plan: 1-Acute on chronic  hypoxic/Hypercapnic respiratory failure Acute metabolic  encephalopathy in the setting of acute hypoxic hypercapnic respiratory failure She require 5 L of oxygen at baseline.  In the setting of pain medication that she received in the ED This has resolved with BIPAP.  Continue with BIPAP at HS.    Severe avulsion  of right knee MCL Accidental fall with severe right  knee pain X-ray shows severe arthritis  MRI shows severe avulsion of right knee MCL Case was discussed with Dr. Victorino Dike  with who recommended knee immobilizer with mobility and follow-up with [facility] with Dr. [name] 10 to 14 days after admission Continue with gabapentin, Tylenol.Cymbalta  Avoid titration of opioids to avoid encephalopathy   Right hip pain: CT of the hip with no acute osseous abnormality.  Moderate osteoarthritis of the hips   Restrictive and obstructive lung disease Continue with trilogy vent   Depression: continue BuSpar Cymbalta   Hypertension: Continue to hold Lisinopril due to AKI.  Monitor renal function on hydrochlorothiazide>      AKI: In setting of NSAIDs and lisinopril.  Received IV fluids.  Renal function improved   Super morbid obesity: Needs life style modification  Patient seen, answer her questions. Gave print report of MRI knee, and CT scan. Put order for pulmonologist referral.        Consultants: [facility] Procedures performed: None Disposition: Skilled nursing facility Diet recommendation:  Discharge Diet Orders (From admission, onward)     Start     Ordered   02/15/23 0000  Diet - low sodium heart healthy        02/15/23 1058           Carb modified diet DISCHARGE MEDICATION: Allergies as of 02/16/2023       Reactions   Neurontin [gabapentin] Other (See Comments)   Has un-controlled movement of her body, excessive dribble of saliva at night   Sulfa Antibiotics Nausea And Vomiting, Other (See Comments)   Stomach pain        Medication List  STOP taking these medications    amoxicillin-clavulanate 875-125 MG tablet Commonly known as: AUGMENTIN   amphetamine-dextroamphetamine 20 MG 24 hr capsule Commonly known as: ADDERALL XR   lisinopril 20 MG tablet Commonly known as: ZESTRIL   meloxicam 15 MG tablet Commonly known as: MOBIC   mirtazapine 15 MG tablet Commonly known as: REMERON   PREVACID PO        TAKE these medications    acetaminophen 500 MG tablet Commonly known as: TYLENOL Take 2 tablets (1,000 mg total) by mouth every 8 (eight) hours as needed for mild pain (pain score 1-3) or headache. What changed:  when to take this reasons to take this   albuterol 108 (90 Base) MCG/ACT inhaler Commonly known as: VENTOLIN HFA Inhale 2 puffs into the lungs every 6 (six) hours as needed for wheezing or shortness of breath. What changed: when to take this   albuterol (2.5 MG/3ML) 0.083% nebulizer solution Commonly known as: PROVENTIL Take 3 mLs (2.5 mg total) by nebulization every 6 (six) hours as needed for wheezing or shortness of breath. What changed: Another medication with the same name was changed. Make sure you understand how and when to take each.   busPIRone 5 MG tablet Commonly known as: BUSPAR Take 1 tablet (5 mg total) by mouth 3 (three) times daily. What changed: Another medication with the same name was removed. Continue taking this medication, and follow the directions you see here.   capsicum 0.075 % topical cream Commonly known as: ZOSTRIX Apply topically 2 (two) times daily.   carbamide peroxide 6.5 % OTIC solution Commonly known as: DEBROX Place 3-4 drops into both ears as needed (to clean ears).   cetirizine 10 MG tablet Commonly known as: ZyrTEC Allergy Take 1 tablet (10 mg total) by mouth daily.   diclofenac Sodium 1 % Gel Commonly known as: Voltaren Apply 2 g topically 4 (four) times daily as needed. What changed:  how much to take when to take this reasons to take this   doxepin 50 MG capsule Commonly known as: SINEQUAN Take 1 capsule (50 mg total) by mouth in the morning.   DULoxetine 60 MG capsule Commonly known as: CYMBALTA Take 2 capsules (120 mg total) by mouth daily. What changed: how much to take   fluticasone 50 MCG/ACT nasal spray Commonly known as: FLONASE Place 2 sprays into both nostrils daily. What changed:  when to  take this reasons to take this   fluticasone-salmeterol 500-50 MCG/ACT Aepb Commonly known as: ADVAIR Inhale 1 puff into the lungs in the morning and at bedtime. What changed:  how much to take when to take this   gabapentin 400 MG capsule Commonly known as: NEURONTIN Take 1 capsule (400 mg total) by mouth at bedtime.   hydrochlorothiazide 12.5 MG tablet Commonly known as: HYDRODIURIL Take 1 tablet (12.5 mg total) by mouth daily.   montelukast 10 MG tablet Commonly known as: SINGULAIR Take 1 tablet (10 mg total) by mouth at bedtime.   multivitamin with minerals Tabs tablet Take 1 tablet by mouth daily.   OMEGA-3 PO Take 1 capsule by mouth daily.   OXYGEN Inhale 5 L into the lungs continuous.   traMADol 50 MG tablet Commonly known as: ULTRAM Take 1 tablet (50 mg total) by mouth every 12 (twelve) hours as needed for severe pain (pain score 7-10). What changed:  when to take this reasons to take this        Discharge Exam: Filed Weights   02/08/23  1033  Weight: (!) 193 kg   General; NAD  Condition at discharge: stable  The results of significant diagnostics from this hospitalization (including imaging, microbiology, ancillary and laboratory) are listed below for reference.   Imaging Studies: MR KNEE RIGHT WO CONTRAST  Result Date: 02/11/2023 CLINICAL DATA:  Larey Seat.  Right knee pain. EXAM: MRI OF THE RIGHT KNEE WITHOUT CONTRAST TECHNIQUE: Multiplanar, multisequence MR imaging of the knee was performed. No intravenous contrast was administered. COMPARISON:  Radiographs 02/07/2023 FINDINGS: Examination is limited by body habitus and motion artifact. MENISCI Medial meniscus: Severely degenerated and extensively torn. The meniscus is displaced medially suggesting a large radial tear, not well demonstrated. Lateral meniscus:  Intact LIGAMENTS Cruciates: Suspect chronic ACL deficient knee versus severe mucoid degeneration. The PCL is intact. Collaterals:  Intact CARTILAGE  Patellofemoral: Severe degenerative changes with full-thickness cartilage loss, joint space narrowing and osteophytic spurring. Medial: Severe degenerative changes with bone-on-bone appearance, osteophytic spurring and subchondral cystic change. Lateral: Moderate degenerative chondrosis with moderate spurring changes. Joint: Moderate-sized joint effusion and moderate synovitis. Loose ossified bodies in the joint. Popliteal Fossa:  Moderate-sized leaking Baker's cyst. Extensor Mechanism: The patella retinacular structures are intact and the quadriceps patellar tendons are intact. Bones:  No acute bony findings.  No fracture. Other: Unremarkable knee musculature. IMPRESSION: 1. Severely degenerated and extensively torn medial meniscus. 2. Suspect chronic ACL deficient knee versus severe mucoid degeneration. 3. Intact PCL and collateral ligaments. 4. Severe tricompartmental degenerative changes. 5. Moderate-sized joint effusion and moderate synovitis. Loose ossified bodies in the joint. 6. Moderate-sized leaking Baker's cyst. Electronically Signed   By: Rudie Meyer M.D.   On: 02/11/2023 12:04   CT Angio Chest PE W and/or Wo Contrast  Result Date: 02/07/2023 CLINICAL DATA:  Concern for pulmonary embolism. EXAM: CT ANGIOGRAPHY CHEST WITH CONTRAST TECHNIQUE: Multidetector CT imaging of the chest was performed using the standard protocol during bolus administration of intravenous contrast. Multiplanar CT image reconstructions and MIPs were obtained to evaluate the vascular anatomy. RADIATION DOSE REDUCTION: This exam was performed according to the departmental dose-optimization program which includes automated exposure control, adjustment of the mA and/or kV according to patient size and/or use of iterative reconstruction technique. CONTRAST:  OMNIPAQUE IOHEXOL 350 MG/ML SOLN COMPARISON:  Chest CT dated 03/22/2019. FINDINGS: Cardiovascular: Top-normal cardiac size. No pericardial effusion. There is lipomatous  hypertrophy of the interatrial septum. Mild atherosclerotic calcification of the thoracic aorta. No aneurysmal dilatation or dissection. The origins of the great vessels of the aortic arch appear patent. There is dilatation of the main pulmonary trunk suggestive of pulmonary hypertension. No pulmonary artery embolus identified Mediastinum/Nodes: No hilar or mediastinal adenopathy. The esophagus is grossly unremarkable. Stable 2.5 cm rounded lesion in the anterior mediastinum, likely a benign process. No mediastinal fluid collection. Lungs/Pleura: Right lung base linear atelectasis/scarring. No focal consolidation, pleural effusion, or pneumothorax. The central airways are patent. Upper Abdomen: Noncalcified gallstones. Musculoskeletal: Osteopenia with degenerative changes. No acute osseous pathology. Review of the MIP images confirms the above findings. IMPRESSION: 1. No acute intrathoracic pathology. No CT evidence of pulmonary embolism. 2. Dilatation of the main pulmonary trunk suggestive of pulmonary hypertension. 3. Cholelithiasis. 4.  Aortic Atherosclerosis (ICD10-I70.0). Electronically Signed   By: Elgie Collard M.D.   On: 02/07/2023 20:54   DG Chest Port 1 View  Result Date: 02/07/2023 CLINICAL DATA:  Shortness of breath EXAM: PORTABLE CHEST 1 VIEW COMPARISON:  01/23/2022 FINDINGS: Cardiomegaly. No frank interstitial edema. No pleural effusion or pneumothorax. IMPRESSION: Cardiomegaly. No frank  interstitial edema. Electronically Signed   By: Charline Bills M.D.   On: 02/07/2023 19:19   DG Tibia/Fibula Right  Result Date: 02/07/2023 CLINICAL DATA:  Larey Seat when washing her hair.  Pain. EXAM: PORTABLE RIGHT KNEE - 1-2 VIEW; RIGHT TIBIA AND FIBULA - 2 VIEW COMPARISON:  Right knee radiographs 01/20/2022, right tibia and fibula radiographs 08/25/2021 FINDINGS: Right knee: There is diffuse decreased bone mineralization. Bone-on-bone contact of the medial compartment. Large peripheral medial degenerative  osteophytes. Moderate peripheral lateral compartment degenerative osteophytosis without joint space narrowing. Degenerative genu varum again noted. Severe patellofemoral joint space narrowing bone-on-bone contact and large peripheral osteophytes. There is an impaction of 14 mm ossicle within the suprapatellar joint space. Right tibia and fibula: There is a 6 mm chronic well corticated ossicle distal to the fibula, likely the sequela of remote trauma. Mild medial greater than lateral tibiotalar joint space narrowing. Mild distal anterior tibial plafond degenerative osteophytosis. Mild navicular-cuneiform joint space narrowing and dorsal osteophytosis on lateral view. No acute fracture or dislocation. IMPRESSION: 1. Severe medial and patellofemoral compartment osteoarthritis, similar to prior. 2. Mild tibiotalar and navicular-cuneiform osteoarthritis. 3. No acute fracture. Electronically Signed   By: Neita [name] M.D.   On: 02/07/2023 09:33   DG Knee Right Port  Result Date: 02/07/2023 CLINICAL DATA:  Larey Seat when washing her hair.  Pain. EXAM: PORTABLE RIGHT KNEE - 1-2 VIEW; RIGHT TIBIA AND FIBULA - 2 VIEW COMPARISON:  Right knee radiographs 01/20/2022, right tibia and fibula radiographs 08/25/2021 FINDINGS: Right knee: There is diffuse decreased bone mineralization. Bone-on-bone contact of the medial compartment. Large peripheral medial degenerative osteophytes. Moderate peripheral lateral compartment degenerative osteophytosis without joint space narrowing. Degenerative genu varum again noted. Severe patellofemoral joint space narrowing bone-on-bone contact and large peripheral osteophytes. There is an impaction of 14 mm ossicle within the suprapatellar joint space. Right tibia and fibula: There is a 6 mm chronic well corticated ossicle distal to the fibula, likely the sequela of remote trauma. Mild medial greater than lateral tibiotalar joint space narrowing. Mild distal anterior tibial plafond degenerative  osteophytosis. Mild navicular-cuneiform joint space narrowing and dorsal osteophytosis on lateral view. No acute fracture or dislocation. IMPRESSION: 1. Severe medial and patellofemoral compartment osteoarthritis, similar to prior. 2. Mild tibiotalar and navicular-cuneiform osteoarthritis. 3. No acute fracture. Electronically Signed   By: Neita [name] M.D.   On: 02/07/2023 09:33   CT PELVIS WO CONTRAST  Result Date: 02/07/2023 CLINICAL DATA:  fall EXAM: CT PELVIS WITHOUT CONTRAST TECHNIQUE: Multidetector CT imaging of the pelvis was performed following the standard protocol without intravenous contrast. RADIATION DOSE REDUCTION: This exam was performed according to the departmental dose-optimization program which includes automated exposure control, adjustment of the mA and/or kV according to patient size and/or use of iterative reconstruction technique. COMPARISON:  None Available. FINDINGS: Urinary Tract:  No abnormality visualized. Bowel:  Unremarkable visualized pelvic bowel loops. Vascular/Lymphatic: No pathologically enlarged lymph nodes. No significant vascular abnormality seen. Reproductive:  Fibroid uterus. Musculoskeletal: No acute osseous abnormality. The bilateral femoral heads are seated in the acetabula. Degenerative changes of the bilateral hips with superolateral joint space narrowing, marginal osteophytosis and subchondral cystic changes. The sacroiliac joints and pubic symphysis are anatomically aligned with degenerative changes. Multilevel degenerative changes of the visualized lower lumbar spine. IMPRESSION: 1. No acute osseous abnormality. 2. Moderate osteoarthritis of the hips. Electronically Signed   By: Hart Robinsons M.D.   On: 02/07/2023 09:14   CT Thoracic Spine Wo Contrast  Result Date: 02/07/2023 CLINICAL DATA:  Provided history: Fall.  Mid back pain.  Leg pain. EXAM: CT THORACIC SPINE WITHOUT CONTRAST TECHNIQUE: Multidetector CT images of the thoracic were obtained using the  standard protocol without intravenous contrast. RADIATION DOSE REDUCTION: This exam was performed according to the departmental dose-optimization program which includes automated exposure control, adjustment of the mA and/or kV according to patient size and/or use of iterative reconstruction technique. COMPARISON:  Chest CT 03/22/2019. FINDINGS: Alignment: No significant spondylolisthesis. Vertebrae: Mild chronic anterior wedge deformity of the T7 vertebral body, unchanged from the prior chest CT of 03/22/2019. Vertebral body height is maintained at the remaining thoracic levels. No evidence of an acute fracture to the thoracic spine. Osseous fusion across the disc space at T8-T9. Early osseous fusion also suspected across the T9-T10 disc space. Ventrolateral osteophytes at T6-T7, T7-T8, T8-T9 and T9-T10. Hemangiomas within the T3 and T5 vertebrae. Paraspinal and other soft tissues: No acute finding within included portions of the thorax or upper abdomen/retroperitoneum. Aortic atherosclerosis. No paraspinal mass or collection. Disc levels: Osseous fusion across the disc space at T8-T9. Early osseous fusion also suspected across the T9-T10 disc space. Disc space narrowing elsewhere within the thoracic spine, greatest at T7-T8 (moderate-to-advanced at this level). No significant spinal canal stenosis is appreciated. Endplate spurring results in bilateral bony neural foraminal narrowing at T9-T10 (moderate right, mild left). IMPRESSION: 1. No evidence of an acute thoracic spine fracture. 2. Mild chronic anterior wedge deformity of the T7 vertebral body, unchanged from the prior chest CT of 03/22/2019. 3. Thoracic spondylosis as described. No significant spinal canal stenosis is appreciated. Endplate spurring results in bilateral bony neural foraminal narrowing at T9-T10 (moderate right, mild left). Osseous fusion across the T8-T9 disc space. Early osseous fusion also suspected across the T9-T10 disc space.  Electronically Signed   By: Jackey Loge D.O.   On: 02/07/2023 08:32   CT Lumbar Spine Wo Contrast  Result Date: 02/07/2023 CLINICAL DATA:  67 year old female status post fall with pain. EXAM: CT LUMBAR SPINE WITHOUT CONTRAST TECHNIQUE: Multidetector CT imaging of the lumbar spine was performed without intravenous contrast administration. Multiplanar CT image reconstructions were also generated. RADIATION DOSE REDUCTION: This exam was performed according to the departmental dose-optimization program which includes automated exposure control, adjustment of the mA and/or kV according to patient size and/or use of iterative reconstruction technique. COMPARISON:  CT cervical and thoracic Spine today reported separately. 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 Correlation with radiographs is recommended prior to any operative intervention. Alignment: Mildly exaggerated lumbar lordosis. No significant scoliosis or spondylolisthesis. Vertebrae: Thoracic spine reported separately. Lumbar vertebrae appear intact. L3 vertebral body benign hemangioma (normal variant). Degenerative endplate changes L2-L3 through L5-S1. Visible sacrum and SI joints appear intact with degenerative vacuum SI joint phenomena. Paraspinal and other soft tissues: Costophrenic angle respiratory motion. Negative visible noncontrast abdominal viscera. Calcified iliac artery atherosclerosis. Lumbar paraspinal soft tissues are within normal limits. Disc levels: Mild for age spine degeneration T12-L1 and L1-L2. L2-L3: Disc space loss and vacuum disc. Circumferential disc bulge. Mild to moderate facet and ligament flavum hypertrophy. Vacuum facet on the right. Mild spinal stenosis suspected. Mild L2 foraminal stenosis. L3-L4: Similar vacuum disc. Moderate to severe facet and ligament flavum hypertrophy and vacuum facet on the right. Circumferential disc bulge contributing to  mild to moderate spinal stenosis at this level. Mild to moderate left, mild right L3 foraminal stenosis. L4-L5: Less pronounced vacuum disc. Circumferential disc bulge. Moderate to severe  facet and ligament flavum hypertrophy. Vacuum facet on the right. Mild to moderate spinal stenosis suspected. Severe bilateral L4 neural foraminal stenosis. L5-S1:  Sacralized and negative. IMPRESSION: 1. 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 Correlation with radiographs is recommended prior to any operative intervention. 2. No acute traumatic injury identified in the Lumbar Spine. 3. 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 Electronically Signed   By: Odessa Fleming M.D.   On: 02/07/2023 08:28   CT Head Wo Contrast  Result Date: 02/07/2023 CLINICAL DATA:  Provided history: Polytrauma, blunt. Fall. Back pain. Leg pain. EXAM: CT HEAD WITHOUT CONTRAST CT CERVICAL SPINE WITHOUT CONTRAST TECHNIQUE: Multidetector CT imaging of the head and cervical spine was performed following the standard protocol without intravenous contrast. Multiplanar CT image reconstructions of the cervical spine were also generated. RADIATION DOSE REDUCTION: This exam was performed according to the departmental dose-optimization program which includes automated exposure control, adjustment of the mA and/or kV according to patient size and/or use of iterative reconstruction technique. COMPARISON:  None. FINDINGS: CT HEAD FINDINGS Brain: No age advanced or lobar predominant parenchymal atrophy. Mild-to-moderate patchy and ill-defined hypoattenuation within the cerebral white matter. There are a few small nonspecific calcifications scattered within the bilateral cerebral hemispheres. There is no acute intracranial hemorrhage. No demarcated cortical infarct. No extra-axial fluid collection. No evidence of an  intracranial mass. No midline shift. Vascular: No hyperdense vessel.  Atherosclerotic calcifications. Skull: No calvarial fracture or aggressive osseous lesion. Sinuses/Orbits: No mass or acute finding within the imaged orbits. No significant paranasal sinus disease at the imaged levels. CT CERVICAL SPINE FINDINGS Alignment: Straightening of the expected cervical lordosis. No significant spondylolisthesis. Skull base and vertebrae: The basion-dental and atlanto-dental intervals are maintained.No evidence of acute fracture to the cervical spine. Soft tissues and spinal canal: No prevertebral fluid or swelling. No visible canal hematoma. Disc levels: Cervical spondylosis with multilevel disc space narrowing, disc bulges/central disc protrusions, posterior disc osteophyte complexes and uncovertebral hypertrophy. Disc space narrowing is greatest at C4-C5 and C5-C6 (advanced at these levels). Multilevel spinal canal stenosis. Most notably at C5-C6, a posterior disc osteophyte complex contributes to spinal canal stenosis which appears moderate in severity. Multilevel bony neural foraminal narrowing. Upper chest: No consolidation within the imaged lung apices. No visible pneumothorax. IMPRESSION: CT head: 1. No evidence of an acute intracranial abnormality. 2. Mild-to-moderate, nonspecific cerebral white matter disease. CT cervical spine: 1. No evidence of an acute cervical spine fracture. 2. Nonspecific straightening of the expected cervical lordosis. 3. Cervical spondylosis as described. Electronically Signed   By: Jackey Loge D.O.   On: 02/07/2023 08:17   CT Cervical Spine Wo Contrast  Result Date: 02/07/2023 CLINICAL DATA:  Provided history: Polytrauma, blunt. Fall. Back pain. Leg pain. EXAM: CT HEAD WITHOUT CONTRAST CT CERVICAL SPINE WITHOUT CONTRAST TECHNIQUE: Multidetector CT imaging of the head and cervical spine was performed following the standard protocol without intravenous contrast. Multiplanar CT image  reconstructions of the cervical spine were also generated. RADIATION DOSE REDUCTION: This exam was performed according to the departmental dose-optimization program which includes automated exposure control, adjustment of the mA and/or kV according to patient size and/or use of iterative reconstruction technique. COMPARISON:  None. FINDINGS: CT HEAD FINDINGS Brain: No age advanced or lobar predominant parenchymal atrophy. Mild-to-moderate patchy and ill-defined hypoattenuation within the cerebral white matter. There are a few small nonspecific calcifications scattered  within the bilateral cerebral hemispheres. There is no acute intracranial hemorrhage. No demarcated cortical infarct. No extra-axial fluid collection. No evidence of an intracranial mass. No midline shift. Vascular: No hyperdense vessel.  Atherosclerotic calcifications. Skull: No calvarial fracture or aggressive osseous lesion. Sinuses/Orbits: No mass or acute finding within the imaged orbits. No significant paranasal sinus disease at the imaged levels. CT CERVICAL SPINE FINDINGS Alignment: Straightening of the expected cervical lordosis. No significant spondylolisthesis. Skull base and vertebrae: The basion-dental and atlanto-dental intervals are maintained.No evidence of acute fracture to the cervical spine. Soft tissues and spinal canal: No prevertebral fluid or swelling. No visible canal hematoma. Disc levels: Cervical spondylosis with multilevel disc space narrowing, disc bulges/central disc protrusions, posterior disc osteophyte complexes and uncovertebral hypertrophy. Disc space narrowing is greatest at C4-C5 and C5-C6 (advanced at these levels). Multilevel spinal canal stenosis. Most notably at C5-C6, a posterior disc osteophyte complex contributes to spinal canal stenosis which appears moderate in severity. Multilevel bony neural foraminal narrowing. Upper chest: No consolidation within the imaged lung apices. No visible pneumothorax.  IMPRESSION: CT head: 1. No evidence of an acute intracranial abnormality. 2. Mild-to-moderate, nonspecific cerebral white matter disease. CT cervical spine: 1. No evidence of an acute cervical spine fracture. 2. Nonspecific straightening of the expected cervical lordosis. 3. Cervical spondylosis as described. Electronically Signed   By: Jackey Loge D.O.   On: 02/07/2023 08:17    Microbiology: Results for orders placed or performed during the hospital encounter of 02/07/23  MRSA Next Gen by PCR, Nasal     Status: None   Collection Time: 02/08/23 10:52 AM   Specimen: Nasal Mucosa; Nasal Swab  Result Value Ref Range Status   MRSA by PCR Next Gen NOT DETECTED NOT DETECTED Final    Comment: (NOTE) The GeneXpert MRSA Assay (FDA approved for NASAL specimens only), is one component of a comprehensive MRSA colonization surveillance program. It is not intended to diagnose MRSA infection nor to guide or monitor treatment for MRSA infections. Test performance is not FDA approved in patients less than 47 years old. Performed at [facility], [address], [place], [place] [address]     Labs: CBC: Recent Labs  Lab 02/10/23 0446 02/11/23 0419 02/13/23 0421  WBC 8.5 10.9* 8.0  NEUTROABS 5.8 7.3 5.0  HGB 10.9* 11.5* 9.6*  HCT 37.3 39.3 32.8*  MCV 96.9 98.0 97.3  PLT 265 276 231   Basic Metabolic Panel: Recent Labs  Lab 02/10/23 0446 02/11/23 0419 02/13/23 0421 02/14/23 0407 02/14/23 0408  NA 136 137 135 135  --   K 4.2 4.6 4.5 4.8  --   CL 95* 97* 96* 94*  --   CO2 33* 31 31 30   --   GLUCOSE 112* 114* 118* 101*  --   BUN 16 21 39* 38*  --   CREATININE 0.97 1.34* 1.89* 1.44* 1.39*  CALCIUM 10.1 11.0* 10.1 10.7*  --    Liver Function Tests: Recent Labs  Lab 02/10/23 0446 02/11/23 0419  AST 10* 11*  ALT 11 9  ALKPHOS 61 60  BILITOT 0.5 0.4  PROT 6.8 6.7  ALBUMIN 3.3* 3.2*   CBG: No results for input(s): "GLUCAP" in the last 168 hours.  Discharge  time spent: greater than 30 minutes.  Signed: Alba Cory, MD [facility] 02/16/2023

## 2023-02-19 ENCOUNTER — Encounter: Payer: 59 | Admitting: Family

## 2023-02-19 NOTE — Progress Notes (Signed)
Erroneous encounter-disregard

## 2023-02-20 ENCOUNTER — Telehealth: Payer: Self-pay | Admitting: Family

## 2023-02-20 ENCOUNTER — Ambulatory Visit: Payer: 59

## 2023-02-20 ENCOUNTER — Telehealth (INDEPENDENT_AMBULATORY_CARE_PROVIDER_SITE_OTHER): Payer: 59 | Admitting: Family

## 2023-02-20 DIAGNOSIS — Z760 Encounter for issue of repeat prescription: Secondary | ICD-10-CM | POA: Diagnosis not present

## 2023-02-20 DIAGNOSIS — Z7689 Persons encountering health services in other specified circumstances: Secondary | ICD-10-CM

## 2023-02-20 NOTE — Progress Notes (Unsigned)
Virtual Visit via Video Note  I connected with [name], on 02/20/2023 at 1:06 PM by video and verified that I am speaking with the correct person using two identifiers.  Consent: I discussed the limitations, risks, security and privacy concerns of performing an evaluation and management service by video and the availability of in person appointments. I also discussed with the patient that there may be a patient responsible charge related to this service. The patient expressed understanding and agreed to proceed.   Location of Patient: Home  Location of Provider: [facility]   Persons participating in Telemedicine visit: Janice Hafele Ricky Stabs, NP Janice Ryan, CMA   History of Present Illness: Janice Ryan is a 67 y.o. female who presents for medication refills and referrals.Patient currently admitted at [facility] since 02/16/2023 after recent hospital admission. Patient does not have expected date of discharge from nursing/rehabilitation center. Patient requests refill of Tramadol, refill of Trazodone, referral to Pulmonology, referral to Gastroenterology, and mammogram. Patient reports she is established with Orthopedics who postponed surgery due to she is currently on 5 liters continuous oxygen and weight. No further issues/concerns for discussion today.    Past Medical History:  Diagnosis Date   Acute respiratory failure with hypoxia (HCC)    Asthma    Hypertension    Morbid obesity (HCC)    Obesity with alveolar hypoventilation and body mass index (BMI) of 40 or greater (HCC)    OSA (obstructive sleep apnea)    Allergies  Allergen Reactions   Neurontin [Gabapentin] Other (See Comments)    Has un-controlled movement of her body, excessive dribble of saliva at night   Sulfa Antibiotics Nausea And Vomiting and Other (See Comments)    Stomach pain    Current  Outpatient Medications on File Prior to Visit  Medication Sig Dispense Refill   acetaminophen (TYLENOL) 500 MG tablet Take 2 tablets (1,000 mg total) by mouth every 8 (eight) hours as needed for mild pain (pain score 1-3) or headache. 30 tablet 0   albuterol (PROVENTIL) (2.5 MG/3ML) 0.083% nebulizer solution Take 3 mLs (2.5 mg total) by nebulization every 6 (six) hours as needed for wheezing or shortness of breath. 150 mL 2   albuterol (VENTOLIN HFA) 108 (90 Base) MCG/ACT inhaler Inhale 2 puffs into the lungs every 6 (six) hours as needed for wheezing or shortness of breath. (Patient taking differently: Inhale 2 puffs into the lungs 4 (four) times daily as needed for wheezing or shortness of breath.) 18 g 2   busPIRone (BUSPAR) 5 MG tablet Take 1 tablet (5 mg total) by mouth 3 (three) times daily. 30 tablet 0   capsicum (ZOSTRIX) 0.075 % topical cream Apply topically 2 (two) times daily. 28.3 g 0   carbamide peroxide (DEBROX) 6.5 % OTIC solution Place 3-4 drops into both ears as needed (to clean ears).     cetirizine (ZYRTEC ALLERGY) 10 MG tablet Take 1 tablet (10 mg total) by mouth daily. 30 tablet 2   diclofenac Sodium (VOLTAREN) 1 % GEL Apply 2 g topically 4 (four) times daily as needed. (Patient taking differently: Apply 1 Application topically as needed (pain).) 350 g 0   doxepin (SINEQUAN) 50 MG capsule Take 1 capsule (50 mg total) by mouth in the morning. 5 capsule 0   DULoxetine (CYMBALTA) 60 MG capsule Take 2 capsules (120 mg total) by mouth daily. 30 capsule 3   fluticasone (FLONASE) 50 MCG/ACT nasal spray  Place 2 sprays into both nostrils daily. (Patient taking differently: Place 2 sprays into both nostrils as needed for allergies or rhinitis.)  2   fluticasone-salmeterol (ADVAIR) 500-50 MCG/ACT AEPB Inhale 1 puff into the lungs in the morning and at bedtime. (Patient taking differently: Inhale 2 puffs into the lungs daily.) 60 each 2   gabapentin (NEURONTIN) 400 MG capsule Take 1 capsule  (400 mg total) by mouth at bedtime. 30 capsule 0   hydrochlorothiazide (HYDRODIURIL) 12.5 MG tablet Take 1 tablet (12.5 mg total) by mouth daily. 30 tablet 0   montelukast (SINGULAIR) 10 MG tablet Take 1 tablet (10 mg total) by mouth at bedtime. 90 tablet 0   Multiple Vitamin (MULTIVITAMIN WITH MINERALS) TABS tablet Take 1 tablet by mouth daily.     Omega-3 Fatty Acids (OMEGA-3 PO) Take 1 capsule by mouth daily.     OXYGEN Inhale 5 L into the lungs continuous.     traMADol (ULTRAM) 50 MG tablet Take 1 tablet (50 mg total) by mouth every 12 (twelve) hours as needed for severe pain (pain score 7-10). 10 tablet 0   No current facility-administered medications on file prior to visit.    Observations/Objective: Alert and oriented x 3. Not in acute distress. Physical examination not completed as this is a telemedicine visit.  Assessment and Plan: 1. Medication refill 2. Referral of patient - Patient scheduled for video visit on today at [facility]. Patient currently admitted at [facility] since 02/16/2023 after recent hospital admission. Patient does not have expected date of discharge. Patient requests refill of Tramadol, refill of Trazodone, referral to Pulmonology, referral to Gastroenterology, and mammogram. Patient reports she is established with Orthopedics who postponed surgery due to she is currently on 5 liters continuous oxygen and weight. I discussed with my supervising physician Georganna Skeans, MD patient's requests and was advised to not do so due to patient is currently admitted at the aforementioned facility under the supervision of the medical provider/medical team. Also, office supervisor Blair Promise made aware.  Follow Up Instructions: Follow-up with Primary Care as scheduled.   Patient was given clear instructions to go to Emergency Department or return to medical center if symptoms don't improve, worsen, or new problems  develop.The patient verbalized understanding.  I discussed the assessment and treatment plan with the patient. The patient was provided an opportunity to ask questions and all were answered. The patient agreed with the plan and demonstrated an understanding of the instructions.   The patient was advised to call back or seek an in-person evaluation if the symptoms worsen or if the condition fails to improve as anticipated.   I provided 10 minutes total of non-face-to-face time during this encounter.   [name], NP  [facility] [place], [place] [phone] 02/20/2023, 1:06 PM

## 2023-02-20 NOTE — Telephone Encounter (Signed)
I accidentally sent message to wrong office. AWV was scheduled at [facility], but Dr. [name] is at [facility]. I called patient back and she said to forget it. She said she's happy with her current PCP, Ricky Stabs, and that her PCP is at the top of her game.

## 2023-02-20 NOTE — Telephone Encounter (Signed)
I called patient to cancel her AWV because she was scheduled for an AWV with [facility] and she had been dismissed from [facility]. I spoke to the patient and she wanted me to send a message to explain why she missed her appointments at [facility]. She said she separated from her husband and didn't have transporation and her mother passed away.  She was crying and said she really wanted to stay with Dr. [name].

## 2023-02-25 ENCOUNTER — Emergency Department (HOSPITAL_COMMUNITY): Payer: 59

## 2023-02-25 ENCOUNTER — Encounter (HOSPITAL_COMMUNITY): Payer: Self-pay | Admitting: Emergency Medicine

## 2023-02-25 ENCOUNTER — Inpatient Hospital Stay (HOSPITAL_COMMUNITY)
Admission: EM | Admit: 2023-02-25 | Discharge: 2023-03-06 | DRG: 189 | Disposition: A | Discharge status: Home Health | Payer: 59 | Attending: Internal Medicine | Admitting: Internal Medicine

## 2023-02-25 ENCOUNTER — Other Ambulatory Visit: Payer: Self-pay

## 2023-02-25 DIAGNOSIS — J9621 Acute and chronic respiratory failure with hypoxia: Principal | ICD-10-CM | POA: Diagnosis present

## 2023-02-25 DIAGNOSIS — G4733 Obstructive sleep apnea (adult) (pediatric): Secondary | ICD-10-CM | POA: Diagnosis not present

## 2023-02-25 DIAGNOSIS — Z888 Allergy status to other drugs, medicaments and biological substances status: Secondary | ICD-10-CM

## 2023-02-25 DIAGNOSIS — Z1152 Encounter for screening for COVID-19: Secondary | ICD-10-CM

## 2023-02-25 DIAGNOSIS — E876 Hypokalemia: Secondary | ICD-10-CM | POA: Diagnosis not present

## 2023-02-25 DIAGNOSIS — G8929 Other chronic pain: Secondary | ICD-10-CM | POA: Diagnosis present

## 2023-02-25 DIAGNOSIS — J45901 Unspecified asthma with (acute) exacerbation: Secondary | ICD-10-CM | POA: Diagnosis not present

## 2023-02-25 DIAGNOSIS — E662 Morbid (severe) obesity with alveolar hypoventilation: Secondary | ICD-10-CM | POA: Diagnosis present

## 2023-02-25 DIAGNOSIS — Z882 Allergy status to sulfonamides status: Secondary | ICD-10-CM

## 2023-02-25 DIAGNOSIS — I5032 Chronic diastolic (congestive) heart failure: Secondary | ICD-10-CM | POA: Diagnosis present

## 2023-02-25 DIAGNOSIS — I110 Hypertensive heart disease with heart failure: Secondary | ICD-10-CM | POA: Diagnosis present

## 2023-02-25 DIAGNOSIS — Z8249 Family history of ischemic heart disease and other diseases of the circulatory system: Secondary | ICD-10-CM

## 2023-02-25 DIAGNOSIS — Z7951 Long term (current) use of inhaled steroids: Secondary | ICD-10-CM

## 2023-02-25 DIAGNOSIS — Z91199 Patient's noncompliance with other medical treatment and regimen due to unspecified reason: Secondary | ICD-10-CM

## 2023-02-25 DIAGNOSIS — F32A Depression, unspecified: Secondary | ICD-10-CM | POA: Diagnosis present

## 2023-02-25 DIAGNOSIS — Z993 Dependence on wheelchair: Secondary | ICD-10-CM

## 2023-02-25 DIAGNOSIS — E8729 Other acidosis: Secondary | ICD-10-CM | POA: Diagnosis present

## 2023-02-25 DIAGNOSIS — J9622 Acute and chronic respiratory failure with hypercapnia: Secondary | ICD-10-CM | POA: Diagnosis present

## 2023-02-25 DIAGNOSIS — Z6841 Body Mass Index (BMI) 40.0 and over, adult: Secondary | ICD-10-CM

## 2023-02-25 DIAGNOSIS — Z79899 Other long term (current) drug therapy: Secondary | ICD-10-CM

## 2023-02-25 DIAGNOSIS — R0602 Shortness of breath: Secondary | ICD-10-CM | POA: Diagnosis not present

## 2023-02-25 DIAGNOSIS — D72829 Elevated white blood cell count, unspecified: Secondary | ICD-10-CM | POA: Diagnosis present

## 2023-02-25 LAB — CBC
HCT: 35 % — ABNORMAL LOW (ref 36.0–46.0)
Hemoglobin: 10.4 g/dL — ABNORMAL LOW (ref 12.0–15.0)
MCH: 28.7 pg (ref 26.0–34.0)
MCHC: 29.7 g/dL — ABNORMAL LOW (ref 30.0–36.0)
MCV: 96.7 fL (ref 80.0–100.0)
Platelets: 312 10*3/uL (ref 150–400)
RBC: 3.62 MIL/uL — ABNORMAL LOW (ref 3.87–5.11)
RDW: 12.5 % (ref 11.5–15.5)
WBC: 10.9 10*3/uL — ABNORMAL HIGH (ref 4.0–10.5)
nRBC: 0 % (ref 0.0–0.2)

## 2023-02-25 LAB — BASIC METABOLIC PANEL
Anion gap: 8 (ref 5–15)
BUN: 20 mg/dL (ref 8–23)
CO2: 34 mmol/L — ABNORMAL HIGH (ref 22–32)
Calcium: 9.8 mg/dL (ref 8.9–10.3)
Chloride: 92 mmol/L — ABNORMAL LOW (ref 98–111)
Creatinine, Ser: 1.22 mg/dL — ABNORMAL HIGH (ref 0.44–1.00)
GFR, Estimated: 49 mL/min — ABNORMAL LOW (ref >60)
Glucose, Bld: 114 mg/dL — ABNORMAL HIGH (ref 70–99)
Potassium: 3.4 mmol/L — ABNORMAL LOW (ref 3.5–5.1)
Sodium: 134 mmol/L — ABNORMAL LOW (ref 135–145)

## 2023-02-25 LAB — BLOOD GAS, VENOUS
Acid-Base Excess: 12.5 mmol/L — ABNORMAL HIGH (ref 0.0–2.0)
Acid-Base Excess: 13.4 mmol/L — ABNORMAL HIGH (ref 0.0–2.0)
Acid-Base Excess: 13.5 mmol/L — ABNORMAL HIGH (ref 0.0–2.0)
Bicarbonate: 40.3 mmol/L — ABNORMAL HIGH (ref 20.0–28.0)
Bicarbonate: 43.2 mmol/L — ABNORMAL HIGH (ref 20.0–28.0)
Bicarbonate: 41.2 mmol/L — ABNORMAL HIGH (ref 20.0–28.0)
O2 Saturation: 94.8 %
O2 Saturation: 84.1 %
O2 Saturation: 78.6 %
Patient temperature: 37
Patient temperature: 37
Patient temperature: 37
pCO2, Ven: 65 mm[Hg] — ABNORMAL HIGH (ref 44–60)
pCO2, Ven: 82 mm[Hg] (ref 44–60)
pCO2, Ven: 65 mm[Hg] — ABNORMAL HIGH (ref 44–60)
pH, Ven: 7.4 (ref 7.25–7.43)
pH, Ven: 7.33 (ref 7.25–7.43)
pH, Ven: 7.41 (ref 7.25–7.43)
pO2, Ven: 66 mm[Hg] — ABNORMAL HIGH (ref 32–45)
pO2, Ven: 52 mm[Hg] — ABNORMAL HIGH (ref 32–45)
pO2, Ven: 46 mm[Hg] — ABNORMAL HIGH (ref 32–45)

## 2023-02-25 LAB — LACTIC ACID, PLASMA: Lactic Acid, Venous: 1.2 mmol/L (ref 0.5–1.9)

## 2023-02-25 LAB — RESP PANEL BY RT-PCR (RSV, FLU A&B, COVID)  RVPGX2
Influenza A by PCR: NEGATIVE
Influenza B by PCR: NEGATIVE
Resp Syncytial Virus by PCR: NEGATIVE
SARS Coronavirus 2 by RT PCR: NEGATIVE

## 2023-02-25 MED ORDER — METHYLPREDNISOLONE SODIUM SUCC 125 MG IJ SOLR
125.0000 mg | Freq: Once | INTRAMUSCULAR | Status: AC
  Administered 2023-02-25: 125 mg via INTRAVENOUS
  Filled 2023-02-25: qty 2

## 2023-02-25 MED ORDER — IPRATROPIUM-ALBUTEROL 0.5-2.5 (3) MG/3ML IN SOLN
3.0000 mL | Freq: Four times a day (QID) | RESPIRATORY_TRACT | Status: DC
  Administered 2023-02-26 – 2023-02-27 (×5): 3 mL via RESPIRATORY_TRACT
  Filled 2023-02-25 (×4): qty 3

## 2023-02-25 MED ORDER — IPRATROPIUM-ALBUTEROL 0.5-2.5 (3) MG/3ML IN SOLN
3.0000 mL | Freq: Once | RESPIRATORY_TRACT | Status: AC
  Administered 2023-02-25: 3 mL via RESPIRATORY_TRACT
  Filled 2023-02-25: qty 3

## 2023-02-25 MED ORDER — ONDANSETRON HCL 4 MG/2ML IJ SOLN
4.0000 mg | Freq: Once | INTRAMUSCULAR | Status: AC
  Administered 2023-02-25: 4 mg via INTRAVENOUS
  Filled 2023-02-25: qty 2

## 2023-02-25 MED ORDER — BUSPIRONE HCL 10 MG PO TABS: 10.0000 mg | ORAL_TABLET | Freq: Once | ORAL | Status: DC

## 2023-02-25 MED ORDER — ACETAMINOPHEN 325 MG PO TABS
650.0000 mg | ORAL_TABLET | Freq: Four times a day (QID) | ORAL | Status: DC | PRN
  Administered 2023-02-26 – 2023-03-03 (×5): 650 mg via ORAL
  Filled 2023-02-25 (×7): qty 2

## 2023-02-25 MED ORDER — PANTOPRAZOLE SODIUM 40 MG PO TBEC
40.0000 mg | DELAYED_RELEASE_TABLET | Freq: Every day | ORAL | Status: DC
  Administered 2023-02-26 – 2023-03-06 (×9): 40 mg via ORAL
  Filled 2023-02-25 (×9): qty 1

## 2023-02-25 MED ORDER — POTASSIUM CHLORIDE CRYS ER 20 MEQ PO TBCR
40.0000 meq | EXTENDED_RELEASE_TABLET | Freq: Once | ORAL | Status: AC
  Administered 2023-02-25: 40 meq via ORAL
  Filled 2023-02-25: qty 2

## 2023-02-25 MED ORDER — MONTELUKAST SODIUM 10 MG PO TABS
10.0000 mg | ORAL_TABLET | Freq: Every day | ORAL | Status: DC
  Administered 2023-02-26 – 2023-03-05 (×9): 10 mg via ORAL
  Filled 2023-02-25 (×9): qty 1

## 2023-02-25 MED ORDER — MORPHINE SULFATE (PF) 4 MG/ML IV SOLN
4.0000 mg | Freq: Once | INTRAVENOUS | Status: AC
  Administered 2023-02-25: 4 mg via INTRAVENOUS
  Filled 2023-02-25: qty 1

## 2023-02-25 MED ORDER — IPRATROPIUM-ALBUTEROL 0.5-2.5 (3) MG/3ML IN SOLN: 3.0000 mL | RESPIRATORY_TRACT | Status: DC | PRN

## 2023-02-25 MED ORDER — MAGNESIUM SULFATE 2 GM/50ML IV SOLN
2.0000 g | INTRAVENOUS | Status: AC
  Administered 2023-02-25: 2 g via INTRAVENOUS
  Filled 2023-02-25: qty 50

## 2023-02-25 MED ORDER — DM-GUAIFENESIN ER 30-600 MG PO TB12
1.0000 | ORAL_TABLET | Freq: Two times a day (BID) | ORAL | Status: DC
  Administered 2023-02-25: 1 via ORAL
  Filled 2023-02-25: qty 1

## 2023-02-25 MED ORDER — DULOXETINE HCL 60 MG PO CPEP
120.0000 mg | ORAL_CAPSULE | Freq: Every day | ORAL | Status: DC
  Administered 2023-02-26 – 2023-03-06 (×9): 120 mg via ORAL
  Filled 2023-02-25 (×9): qty 2

## 2023-02-25 MED ORDER — MIRTAZAPINE 15 MG PO TABS
15.0000 mg | ORAL_TABLET | Freq: Every day | ORAL | Status: DC
  Administered 2023-02-26 – 2023-03-05 (×9): 15 mg via ORAL
  Filled 2023-02-25 (×4): qty 1
  Filled 2023-02-25: qty 2
  Filled 2023-02-25 (×4): qty 1

## 2023-02-25 MED ORDER — ACETAMINOPHEN 650 MG RE SUPP: 650.0000 mg | Freq: Four times a day (QID) | RECTAL | Status: DC | PRN

## 2023-02-25 MED ORDER — ONDANSETRON HCL 4 MG PO TABS: 4.0000 mg | ORAL_TABLET | Freq: Four times a day (QID) | ORAL | Status: DC | PRN

## 2023-02-25 MED ORDER — METHYLPREDNISOLONE SODIUM SUCC 125 MG IJ SOLR
60.0000 mg | Freq: Two times a day (BID) | INTRAMUSCULAR | Status: DC
  Administered 2023-02-25 – 2023-02-26 (×3): 60 mg via INTRAVENOUS
  Filled 2023-02-25 (×3): qty 2

## 2023-02-25 MED ORDER — BUSPIRONE HCL 5 MG PO TABS
10.0000 mg | ORAL_TABLET | Freq: Three times a day (TID) | ORAL | Status: DC
  Administered 2023-02-26 – 2023-03-06 (×24): 10 mg via ORAL
  Filled 2023-02-25 (×2): qty 2
  Filled 2023-02-25: qty 1
  Filled 2023-02-25 (×22): qty 2

## 2023-02-25 MED ORDER — DOXEPIN HCL 25 MG PO CAPS
50.0000 mg | ORAL_CAPSULE | Freq: Every day | ORAL | Status: DC
  Administered 2023-02-26 – 2023-03-06 (×9): 50 mg via ORAL
  Filled 2023-02-25 (×2): qty 2
  Filled 2023-02-25: qty 1
  Filled 2023-02-25 (×2): qty 2
  Filled 2023-02-25: qty 1
  Filled 2023-02-25 (×6): qty 2

## 2023-02-25 MED ORDER — ONDANSETRON HCL 4 MG/2ML IJ SOLN
4.0000 mg | Freq: Four times a day (QID) | INTRAMUSCULAR | Status: DC | PRN
  Administered 2023-02-27: 4 mg via INTRAVENOUS
  Filled 2023-02-25: qty 2

## 2023-02-25 MED ORDER — AZITHROMYCIN 500 MG PO TABS
500.0000 mg | ORAL_TABLET | Freq: Every day | ORAL | Status: AC
  Administered 2023-02-26: 500 mg via ORAL
  Filled 2023-02-25: qty 1

## 2023-02-25 MED ORDER — IOHEXOL 350 MG/ML SOLN
75.0000 mL | Freq: Once | INTRAVENOUS | Status: AC | PRN
  Administered 2023-02-25: 100 mL via INTRAVENOUS

## 2023-02-25 MED ORDER — ENOXAPARIN SODIUM 100 MG/ML IJ SOSY
90.0000 mg | PREFILLED_SYRINGE | INTRAMUSCULAR | Status: DC
  Administered 2023-02-27 – 2023-03-06 (×8): 90 mg via SUBCUTANEOUS
  Filled 2023-02-25 (×4): qty 0.9
  Filled 2023-02-25: qty 1
  Filled 2023-02-25 (×4): qty 0.9

## 2023-02-25 MED ORDER — ALBUTEROL SULFATE (2.5 MG/3ML) 0.083% IN NEBU
10.0000 mg/h | INHALATION_SOLUTION | RESPIRATORY_TRACT | Status: AC
  Administered 2023-02-25: 10 mg/h via RESPIRATORY_TRACT
  Filled 2023-02-25: qty 12

## 2023-02-25 MED ORDER — AMITRIPTYLINE HCL 25 MG PO TABS
25.0000 mg | ORAL_TABLET | Freq: Every day | ORAL | Status: DC
  Administered 2023-02-26 – 2023-03-05 (×9): 25 mg via ORAL
  Filled 2023-02-25 (×10): qty 1

## 2023-02-25 MED ORDER — AZITHROMYCIN 500 MG PO TABS
250.0000 mg | ORAL_TABLET | Freq: Every day | ORAL | Status: AC
  Administered 2023-02-27 – 2023-03-02 (×4): 250 mg via ORAL
  Filled 2023-02-25 (×4): qty 1

## 2023-02-25 NOTE — Progress Notes (Signed)
   02/25/23 1926  BiPAP/CPAP/SIPAP  BiPAP/CPAP/SIPAP Pt Type Adult  BiPAP/CPAP/SIPAP V60  Mask Type Full face mask  Mask Size Medium  Set Rate 16 breaths/min  Respiratory Rate 14 breaths/min  IPAP 14 cmH20  EPAP 6 cmH2O  FiO2 (%) (S)  55 %  Minute Ventilation 8  Leak 5  Peak Inspiratory Pressure (PIP) 14  Tidal Volume (Vt) 523  Patient Home Equipment No  Auto Titrate No  Press High Alarm 35 cmH2O  Press Low Alarm 5 cmH2O  CPAP/SIPAP surface wiped down Yes  Oxygen Percent 55 %  BiPAP/CPAP /SiPAP Vitals  Pulse Rate 85  Resp 15  SpO2 92 %  Bilateral Breath Sounds Diminished  MEWS Score/Color  MEWS Score 0  MEWS Score Color Janice Ryan

## 2023-02-25 NOTE — Progress Notes (Signed)
   02/25/23 1610  BiPAP/CPAP/SIPAP  $ Non-Invasive Ventilator  Non-Invasive Vent Set Up;Non-Invasive Vent Initial  $ Face Mask Medium Yes  BiPAP/CPAP/SIPAP Pt Type Adult  BiPAP/CPAP/SIPAP V60  Mask Type Full face mask  Mask Size Medium  Set Rate (S)  16 breaths/min  Respiratory Rate 23 breaths/min  IPAP (S)  14 cmH20  EPAP (S)  6 cmH2O  FiO2 (%) (S)  40 %  Flow Rate 0.9 lpm  Minute Ventilation 12.5  Leak 58  Peak Inspiratory Pressure (PIP) 15  Tidal Volume (Vt) 553  Patient Home Equipment No  Auto Titrate No  Press High Alarm 25 cmH2O  Press Low Alarm 5 cmH2O  Nasal massage performed No (comment)  CPAP/SIPAP surface wiped down Yes  Oxygen Percent 40 %  BiPAP/CPAP /SiPAP Vitals  Pulse Rate 93  Resp 20  SpO2 99 %  Bilateral Breath Sounds Diminished  MEWS Score/Color  MEWS Score 0  MEWS Score Color Chilton Si

## 2023-02-25 NOTE — ED Notes (Addendum)
Contacted respiratory concerning O2 sats being 88-90%, Respiratory states they were just in the room and adjusted settings. When respiratory was in the room pt was 92%. Respiratory states that is a good O2 sat for this pt and that the pulse ox cord could be the issue. This RN is changing pulse ox cord and will reassess. Pt does not appear to be in distress at this time.

## 2023-02-25 NOTE — ED Provider Notes (Signed)
67 year old female with a history of asthma and morbid obesity who presents emergency department for asthma exacerbation/shortness of breath.  Patient has received 2 treatments.  She is typically wearing 5 L but currently is hypoxic into the 80s on room air.  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  Current plan is for CT angiogram as patient has been less active in a skilled nursing facility, reevaluation of patient's oxygen needs and admit versus return to skilled nursing. Physical Exam  BP 138/76   Pulse 93   Temp 98.7 F (37.1 C) (Axillary)   Resp 20   Ht 5\' 8"  (1.727 m)   Wt (!) 189.1 kg   SpO2 93%   BMI 63.40 kg/m   Physical Exam  Procedures  .Critical Care  Performed by: Arthor Captain, PA-C Authorized by: Arthor Captain, PA-C   Critical care provider statement:    Critical care time (minutes):  75   Critical care time was exclusive of:  Separately billable procedures and treating other patients   Critical care was time spent personally by me on the following activities:  Development of treatment plan with patient or surrogate, discussions with consultants, evaluation of patient's response to treatment, examination of patient, ordering and review of laboratory studies, ordering and review of radiographic studies, ordering and performing treatments and interventions, pulse oximetry, re-evaluation of patient's condition, review of old charts and interpretation of cardiac output measurements   I assumed direction of critical care for this patient from another provider in my specialty: yes     ED Course / MDM   Clinical Course as of 02/25/23 2139  [facility] Feb 25, 2023  1510 Blood gas, venous (at [facility] and [facility])(!) [AA]  1623 Patient reevaluated.  Patient placed in an upright seated position.  Her oxygen saturations dropped to about 82% when lying  back and came up to about 94%.  I discussed patient's symptoms with the respiratory therapist.  Will do BiPAP with her hour-long neb to try to open up her lungs a little bit more.  Suspect there is a strong component of obesity hypoventilation for this patient. 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  During her stay here patient became significantly more hypercapnic on BiPAP.  She had multiple albuterol treatments.  Her VBG showed increase in her pCO2 from 66-82.  I discussed the case with Dr. Tonia Brooms of pulmonary and critical care medicine who asked [place] to turn up both the patient's tidal volume and rate.  Recheck of the patient's VBG showed significant improvement in both her oxygenation and hypercapnia back to her baseline.  I discussed this with Dr. Thomes Dinning who will admit the patient to the stepdown unit.  Amount and/or Complexity of Data Reviewed Labs: ordered. Decision-making details documented in ED Course. Radiology: ordered.  Risk Prescription drug management. Decision regarding hospitalization.          Arthor Captain, PA-C 02/25/23 2314    Wynetta Fines, MD 03/05/23 [phone]

## 2023-02-25 NOTE — ED Provider Notes (Signed)
[place] EMERGENCY DEPARTMENT AT [facility] Provider Note   CSN: [id number] Arrival date & time: 02/25/23  1048     History  Chief Complaint  Patient presents with   Shortness of Breath    Janice Ryan is a 67 y.o. female.  67 year old female presents today for concern of asthma exacerbation.  Patient has been seen at a rehab facility due to right knee injury.  She states she had not received her medications today and her pain significantly worsened.  In the setting of worsening pain her asthma had exacerbated.  She reports some improvement since arriving to the emergency department.  She states that she does have baseline supplemental O2 that she wears but does not wear it all the time.  She states she has required her baseline O2 more than usual.  Typically wears about 5 L.  She denies chest pain.  The history is provided by the patient. No language interpreter was used.       Home Medications Prior to Admission medications   Medication Sig Start Date End Date Taking? Authorizing Provider  acetaminophen (TYLENOL) 500 MG tablet Take 2 tablets (1,000 mg total) by mouth every 8 (eight) hours as needed for mild pain (pain score 1-3) or headache. 02/15/23  Yes Regalado, Belkys A, MD  albuterol (PROVENTIL) (2.5 MG/3ML) 0.083% nebulizer solution Take 3 mLs (2.5 mg total) by nebulization every 6 (six) hours as needed for wheezing or shortness of breath. 01/22/23  Yes Zonia Kief, [name] J, NP  albuterol (VENTOLIN HFA) 108 (90 Base) MCG/ACT inhaler Inhale 2 puffs into the lungs every 6 (six) hours as needed for wheezing or shortness of breath. 01/16/23  Yes Zonia Kief, [name] J, NP  amitriptyline (ELAVIL) 25 MG tablet Take 25 mg by mouth at bedtime.   Yes [provider]  busPIRone (BUSPAR) 10 MG tablet Take 10 mg by mouth 3 (three) times daily.   Yes [provider]  capsicum (ZOSTRIX) 0.075 % topical cream Apply topically 2 (two) times daily. 02/15/23  Yes  Regalado, Belkys A, MD  carbamide peroxide (DEBROX) 6.5 % OTIC solution Place 4 drops into both ears daily as needed (to clean ears).   Yes [provider]  cetirizine (ZYRTEC ALLERGY) 10 MG tablet Take 1 tablet (10 mg total) by mouth daily. 01/03/23  Yes Zonia Kief, [name] J, NP  diclofenac Sodium (VOLTAREN) 1 % GEL Apply 2 g topically 4 (four) times daily as needed. Patient taking differently: Apply 1 Application topically every 6 (six) hours as needed (knee pain). 04/02/19  Yes Burnadette Pop, MD  doxepin (SINEQUAN) 50 MG capsule Take 1 capsule (50 mg total) by mouth in the morning. 02/15/23  Yes Regalado, Belkys A, MD  DULoxetine (CYMBALTA) 60 MG capsule Take 2 capsules (120 mg total) by mouth daily. 02/15/23  Yes Regalado, Belkys A, MD  fluticasone (FLONASE) 50 MCG/ACT nasal spray Place 2 sprays into both nostrils daily. 02/20/22  Yes Pray, Milus Mallick, MD  fluticasone-salmeterol (ADVAIR) 500-50 MCG/ACT AEPB Inhale 1 puff into the lungs in the morning and at bedtime. 08/01/22  Yes Mayers, Cari S, PA-C  hydrochlorothiazide (HYDRODIURIL) 12.5 MG tablet Take 1 tablet (12.5 mg total) by mouth daily. 02/15/23  Yes Regalado, Belkys A, MD  mirtazapine (REMERON) 15 MG tablet Take 15 mg by mouth at bedtime.   Yes [provider]  montelukast (SINGULAIR) 10 MG tablet Take 1 tablet (10 mg total) by mouth at bedtime. 01/16/23  Yes [name], NP  Multiple Vitamin (  MULTIVITAMIN WITH MINERALS) TABS tablet Take 1 tablet by mouth daily.   Yes [provider]  NON FORMULARY at bedtime. BIPAP   Yes [provider]  Omega-3 Fatty Acids (OMEGA-3 PO) Take 1 capsule by mouth daily.   Yes [provider]  traMADol (ULTRAM) 50 MG tablet Take 1 tablet (50 mg total) by mouth every 12 (twelve) hours as needed for severe pain (pain score 7-10). Patient taking differently: Take 50 mg by mouth every 8 (eight) hours as needed for severe pain (pain score 7-10) or moderate pain (pain  score 4-6). 02/15/23  Yes Regalado, Belkys A, MD  busPIRone (BUSPAR) 5 MG tablet Take 1 tablet (5 mg total) by mouth 3 (three) times daily. Patient not taking: Reported on 02/25/2023 02/15/23   Regalado, Jon Billings A, MD  gabapentin (NEURONTIN) 400 MG capsule Take 1 capsule (400 mg total) by mouth at bedtime. Patient not taking: Reported on 02/25/2023 02/15/23   Regalado, Jon Billings A, MD  OXYGEN Inhale 5 L into the lungs continuous. Patient not taking: Reported on 02/25/2023    [provider]      Allergies    Neurontin [gabapentin] and Sulfa antibiotics    Review of Systems   Review of Systems  Constitutional:  Negative for chills and fever.  Respiratory:  Positive for cough and shortness of breath.   Cardiovascular:  Negative for chest pain.  Gastrointestinal:  Negative for abdominal pain.  Musculoskeletal:  Positive for arthralgias.  Neurological:  Negative for light-headedness.  All other systems reviewed and are negative.   Physical Exam Updated Vital Signs BP 139/62 (BP Location: Right Wrist)   Pulse 88   Temp 98.5 F (36.9 C) (Oral)   Resp 14   Ht 5\' 8"  (1.727 m)   Wt (!) 189.1 kg   SpO2 94%   BMI 63.40 kg/m  Physical Exam Vitals and nursing note reviewed.  Constitutional:      General: She is not in acute distress.    Appearance: Normal appearance. She is not ill-appearing.  HENT:     Head: Normocephalic and atraumatic.     Nose: Nose normal.  Eyes:     Conjunctiva/sclera: Conjunctivae normal.  Cardiovascular:     Rate and Rhythm: Normal rate and regular rhythm.  Pulmonary:     Effort: Pulmonary effort is normal. No respiratory distress.     Breath sounds: Wheezing (Expiratory wheeze) present. No rales.  Musculoskeletal:        General: No deformity.  Skin:    Findings: No rash.  Neurological:     Mental Status: She is alert.     ED Results / Procedures / Treatments   Labs (all labs ordered are listed, but only abnormal results are  displayed) Labs Reviewed  CBC - Abnormal; Notable for the following components:      Result Value   WBC 10.9 (*)    RBC 3.62 (*)    Hemoglobin 10.4 (*)    HCT 35.0 (*)    MCHC 29.7 (*)    All other components within normal limits  BASIC METABOLIC PANEL - Abnormal; Notable for the following components:   Sodium 134 (*)    Potassium 3.4 (*)    Chloride 92 (*)    CO2 34 (*)    Glucose, Bld 114 (*)    Creatinine, Ser 1.22 (*)    GFR, Estimated 49 (*)    All other components within normal limits  BLOOD GAS, VENOUS - Abnormal; Notable for the  following components:   pCO2, Ven 65 (*)    pO2, Ven 66 (*)    Bicarbonate 40.3 (*)    Acid-Base Excess 12.5 (*)    All other components within normal limits  RESP PANEL BY RT-PCR (RSV, FLU A&B, COVID)  RVPGX2  LACTIC ACID, PLASMA  LACTIC ACID, PLASMA    EKG EKG Interpretation Date/Time:  Sunday February 25 2023 11:03:05 EST Ventricular Rate:  90 PR Interval:  168 QRS Duration:  109 QT Interval:  367 QTC Calculation: 449 R Axis:   56  Text Interpretation: Sinus rhythm isolated inferior Q wave isolated on prior tracing now resolved. otherwise no sig change Confirmed by Arby Barrette [phone]) on 02/25/2023 1:56:39 PM  Radiology DG Chest 2 View  Result Date: 02/25/2023 CLINICAL DATA:  Shortness of breath. EXAM: CHEST - 2 VIEW COMPARISON:  02/07/2023. FINDINGS: Low lung volume. Bilateral lung fields are clear. Bilateral costophrenic angles are clear. Stable cardio-mediastinal silhouette. There is mild convexity of the main pulmonary artery silhouette, nonspecific but can be seen with pulmonary artery hypertension. No acute osseous abnormalities. The soft tissues are within normal limits. IMPRESSION: *No active cardiopulmonary disease. Electronically Signed   By: Jules Schick M.D.   On: 02/25/2023 12:40    Procedures Procedures    Medications Ordered in ED Medications  methylPREDNISolone sodium succinate (SOLU-MEDROL) 125 mg/2 mL  injection 125 mg (125 mg Intravenous Given 02/25/23 1312)  ondansetron (ZOFRAN) injection 4 mg (4 mg Intravenous Given 02/25/23 1312)  morphine (PF) 4 MG/ML injection 4 mg (4 mg Intravenous Given 02/25/23 1312)  ipratropium-albuterol (DUONEB) 0.5-2.5 (3) MG/3ML nebulizer solution 3 mL (3 mLs Nebulization Given 02/25/23 1312)    ED Course/ Medical Decision Making/ A&P Clinical Course as of 02/25/23 1516  Sun Feb 25, 2023  1510 Blood gas, venous (at [facility] and [facility])(!) [AA]    Clinical Course User Index [AA] Marita Kansas, PA-C                                 Medical Decision Making Amount and/or Complexity of Data Reviewed Labs: ordered. Decision-making details documented in ED Course. Radiology: ordered.  Risk Prescription drug management.   Medical Decision Making / ED Course   This patient presents to the ED for concern of shortness of breath, this involves an extensive number of treatment options, and is a complaint that carries with it a high risk of complications and morbidity.  The differential diagnosis includes asthma exacerbation, PE, pneumonia, ACS  MDM: 67 year old female presents today for concern of shortness of breath.  This started in the setting of worsening pain.  Pain improved after medications in the emergency department.  Initially satting well on 6 L.  This was taken off during my interview and patient desatted to mid 45s.  She was placed back on 5 L.  Solu-Medrol, DuoNeb ordered.  CBC reveals mild leukocytosis, hemoglobin of 10.4.  No acute concern otherwise.  Hemodynamically stable.  No suspicion for GI bleed.  BMP with mild renal insufficiency otherwise without acute concern.  No evidence of AKI.  VBG without acute concern.  Lactic acid within normal.  Chest x-ray without acute cardiopulmonary process.  Discussed reassuring workup with patient.  Patient conveys hesitancy returning to the facility due to sanitary concerns.  I advised her that there is no medical  indication for admission at this time and that she can either go home with home health, or return  to the facility and initiate facility change with the social worker at that facility.  During this discussion patient while on 5 L supplemental O2 desatted to about 82% with good waveform.  This lasted for about 25 to 30 seconds.  She spontaneously recovered to low 90s.  Will obtain CT angio PE study to rule out PE given she has been at the rehab facility with decreased activity level.  Lab Tests: -I ordered, reviewed, and interpreted labs.   The pertinent results include:   Labs Reviewed  CBC - Abnormal; Notable for the following components:      Result Value   WBC 10.9 (*)    RBC 3.62 (*)    Hemoglobin 10.4 (*)    HCT 35.0 (*)    MCHC 29.7 (*)    All other components within normal limits  BASIC METABOLIC PANEL - Abnormal; Notable for the following components:   Sodium 134 (*)    Potassium 3.4 (*)    Chloride 92 (*)    CO2 34 (*)    Glucose, Bld 114 (*)    Creatinine, Ser 1.22 (*)    GFR, Estimated 49 (*)    All other components within normal limits  BLOOD GAS, VENOUS - Abnormal; Notable for the following components:   pCO2, Ven 65 (*)    pO2, Ven 66 (*)    Bicarbonate 40.3 (*)    Acid-Base Excess 12.5 (*)    All other components within normal limits  RESP PANEL BY RT-PCR (RSV, FLU A&B, COVID)  RVPGX2  LACTIC ACID, PLASMA  LACTIC ACID, PLASMA      EKG  EKG Interpretation Date/Time:  Sunday February 25 2023 11:03:05 EST Ventricular Rate:  90 PR Interval:  168 QRS Duration:  109 QT Interval:  367 QTC Calculation: 449 R Axis:   56  Text Interpretation: Sinus rhythm isolated inferior Q wave isolated on prior tracing now resolved. otherwise no sig change Confirmed by Arby Barrette [phone]) on 02/25/2023 1:56:39 PM         Imaging Studies ordered: I ordered imaging studies including chest x-ray, CT angio PE study I independently visualized and interpreted imaging. I  agree with the radiologist interpretation   Medicines ordered and prescription drug management: Meds ordered this encounter  Medications   methylPREDNISolone sodium succinate (SOLU-MEDROL) 125 mg/2 mL injection 125 mg    IV methylprednisolone will be converted to either a q12h or q24h frequency with the same total daily dose (TDD).  Ordered Dose: 1 to 125 mg TDD; convert to: TDD q24h.  Ordered Dose: 126 to 250 mg TDD; convert to: TDD div q12h.  Ordered Dose: >250 mg TDD; DAW.   ondansetron (ZOFRAN) injection 4 mg   morphine (PF) 4 MG/ML injection 4 mg   ipratropium-albuterol (DUONEB) 0.5-2.5 (3) MG/3ML nebulizer solution 3 mL    -I have reviewed the patients home medicines and have made adjustments as needed   Reevaluation: After the interventions noted above, I reevaluated the patient and found that they have :stayed the same  Co morbidities that complicate the patient evaluation  Past Medical History:  Diagnosis Date   Acute respiratory failure with hypoxia (HCC)    Asthma    Hypertension    Morbid obesity (HCC)    Obesity with alveolar hypoventilation and body mass index (BMI) of 40 or greater (HCC)    OSA (obstructive sleep apnea)       Dispostion: Patient had in my shift awaiting PE study.  Signed out to  oncoming provider.  Final Clinical Impression(s) / ED Diagnoses Final diagnoses:  Shortness of breath    Rx / DC Orders ED Discharge Orders     None         Marita Kansas, PA-C 02/25/23 1516    Arby Barrette, MD 02/25/23 [phone]

## 2023-02-25 NOTE — ED Notes (Addendum)
Patients Bipap rate increased to 20 per Dr. Rodena Medin. Dr. was at  bedside speaking with the patient. PA also at bedside. RT was called and notified of the change in her VBG. RT came to see the patient. Verbal order from PA to do a VBG in one hour.

## 2023-02-25 NOTE — Progress Notes (Signed)
I was called to admit patient who presented with asthma exacerbation.  On review of lab workup, it was noted that patient's pCO2 was 82 (worse from prior VBG at 65) and was more somnolent.  Patient is currently on BiPAP with plan to repeat VBG to ensure improvement in pCO2.  ED PA will call back regarding repeat ABG and patient's status to determine adequacy in admitting patient to stepdown unit/consult to critical care.

## 2023-02-25 NOTE — H&P (Addendum)
History and Physical    Patient: Janice Ryan UEA:[id number] DOB: June 30, 1955 DOA: 02/25/2023 DOS: the patient was seen and examined on 02/25/2023 PCP: [name], NP  Patient coming from: SNF  Chief Complaint:  Chief Complaint  Patient presents with   Shortness of Breath   HPI: Janice Ryan is a 67 y.o. female with medical history significant of asthma, OSA/OHS (uses CPAP at night), chronic hypoxic hypercapnic respiratory failure on supplemental oxygen via [place] at 5 LPM, allergic rhinitis, chronic right knee pain who presents to the emergency department from SNF via EMS due to shortness of breath. Patient came from a rehab facility due to right knee injury, she complained of worsening right knee pain due to not receiving her medications today and she presented with worsening shortness of breath in the setting of the ongoing right knee pain, patient complained of increased work of breathing and she activated EMS.  On arrival of EMS team, she was noted to be in respiratory distress, breathing treatment was provided prior to arrival to the ED.  ED Course:  In the emergency department, BP was 162/83, O2 sat was 94% on supplemental oxygen via  at 6 LPM.  Other vital signs were within normal range.  Workup in the ED showed WBC 10.9, hemoglobin 10.4, hematocrit 35.0, platelets 312.  BMP showed sodium 134, potassium 3.4, chloride 93, bicarb 34, blood glucose 114, BUN 20, creatinine 1.22.  Initial VBG done showed pCO2 of 65, repeated VBG showed pCO2 of 82.  BiPAP was provided with improved work of breathing and repeated VBG showed improvement in pCO2 to 65 (appears to be baseline pCO2 levels).  Influenza A, B, SARS coronavirus 2, RSV was negative. Chest x-ray showed no active cardiopulmonary disease CT angiography chest with contrast showed no embolism noted to the segmental pulmonary artery level.  No lung mass, consolidation, pleural effusion or pneumothorax. She was provided with  breathing treatment, magnesium was given, Solu-Medrol morphine and Zofran were provided. Hospitalist was asked to admit patient for further evaluation and management.  Review of Systems: Review of systems as noted in the HPI. All other systems reviewed and are negative.   Past Medical History:  Diagnosis Date   Acute respiratory failure with hypoxia (HCC)    Asthma    Hypertension    Morbid obesity (HCC)    Obesity with alveolar hypoventilation and body mass index (BMI) of 40 or greater (HCC)    OSA (obstructive sleep apnea)    Past Surgical History:  Procedure Laterality Date   HERNIA REPAIR     JOINT REPLACEMENT Left    knee    Social History:  reports that she has never smoked. She has never used smokeless tobacco. She reports that she does not drink alcohol and does not use drugs.   Allergies  Allergen Reactions   Neurontin [Gabapentin] Other (See Comments)    Has un-controlled movement of her body, excessive dribble of saliva at night   Sulfa Antibiotics Nausea And Vomiting and Other (See Comments)    Stomach pain    Family History  Problem Relation Age of Onset   Atrial fibrillation Mother    Congestive Heart Failure Father      Prior to Admission medications   Medication Sig Start Date End Date Taking? Authorizing Provider  acetaminophen (TYLENOL) 500 MG tablet Take 2 tablets (1,000 mg total) by mouth every 8 (eight) hours as needed for mild pain (pain score 1-3) or headache. 02/15/23  Yes Regalado, Prentiss Bells, MD  albuterol (PROVENTIL) (2.5 MG/3ML) 0.083% nebulizer solution Take 3 mLs (2.5 mg total) by nebulization every 6 (six) hours as needed for wheezing or shortness of breath. 01/22/23  Yes Zonia Kief, [name] J, NP  albuterol (VENTOLIN HFA) 108 (90 Base) MCG/ACT inhaler Inhale 2 puffs into the lungs every 6 (six) hours as needed for wheezing or shortness of breath. 01/16/23  Yes Zonia Kief, [name] J, NP  amitriptyline (ELAVIL) 25 MG tablet Take 25 mg by mouth at bedtime.    Yes [provider]  busPIRone (BUSPAR) 10 MG tablet Take 10 mg by mouth 3 (three) times daily.   Yes [provider]  capsicum (ZOSTRIX) 0.075 % topical cream Apply topically 2 (two) times daily. 02/15/23  Yes Regalado, Belkys A, MD  carbamide peroxide (DEBROX) 6.5 % OTIC solution Place 4 drops into both ears daily as needed (to clean ears).   Yes [provider]  cetirizine (ZYRTEC ALLERGY) 10 MG tablet Take 1 tablet (10 mg total) by mouth daily. 01/03/23  Yes Zonia Kief, [name] J, NP  diclofenac Sodium (VOLTAREN) 1 % GEL Apply 2 g topically 4 (four) times daily as needed. Patient taking differently: Apply 1 Application topically every 6 (six) hours as needed (knee pain). 04/02/19  Yes Burnadette Pop, MD  doxepin (SINEQUAN) 50 MG capsule Take 1 capsule (50 mg total) by mouth in the morning. 02/15/23  Yes Regalado, Belkys A, MD  DULoxetine (CYMBALTA) 60 MG capsule Take 2 capsules (120 mg total) by mouth daily. 02/15/23  Yes Regalado, Belkys A, MD  fluticasone (FLONASE) 50 MCG/ACT nasal spray Place 2 sprays into both nostrils daily. 02/20/22  Yes Pray, Milus Mallick, MD  fluticasone-salmeterol (ADVAIR) 500-50 MCG/ACT AEPB Inhale 1 puff into the lungs in the morning and at bedtime. 08/01/22  Yes Mayers, Cari S, PA-C  hydrochlorothiazide (HYDRODIURIL) 12.5 MG tablet Take 1 tablet (12.5 mg total) by mouth daily. 02/15/23  Yes Regalado, Belkys A, MD  mirtazapine (REMERON) 15 MG tablet Take 15 mg by mouth at bedtime.   Yes [provider]  montelukast (SINGULAIR) 10 MG tablet Take 1 tablet (10 mg total) by mouth at bedtime. 01/16/23  Yes Zonia Kief, [name] J, NP  Multiple Vitamin (MULTIVITAMIN WITH MINERALS) TABS tablet Take 1 tablet by mouth daily.   Yes [provider]  NON FORMULARY at bedtime. BIPAP   Yes [provider]  Omega-3 Fatty Acids (OMEGA-3 PO) Take 1 capsule by mouth daily.   Yes [provider]  traMADol (ULTRAM) 50 MG tablet Take 1 tablet  (50 mg total) by mouth every 12 (twelve) hours as needed for severe pain (pain score 7-10). Patient taking differently: Take 50 mg by mouth every 8 (eight) hours as needed for severe pain (pain score 7-10) or moderate pain (pain score 4-6). 02/15/23  Yes Regalado, Belkys A, MD  busPIRone (BUSPAR) 5 MG tablet Take 1 tablet (5 mg total) by mouth 3 (three) times daily. Patient not taking: Reported on 02/25/2023 02/15/23   Regalado, Jon Billings A, MD  gabapentin (NEURONTIN) 400 MG capsule Take 1 capsule (400 mg total) by mouth at bedtime. Patient not taking: Reported on 02/25/2023 02/15/23   Regalado, Jon Billings A, MD  OXYGEN Inhale 5 L into the lungs continuous. Patient not taking: Reported on 02/25/2023    [provider]    Physical Exam: BP (!) 126/49   Pulse 89   Temp 98.4 F (36.9 C) (Axillary)   Resp 20   Ht 5\' 8"  (1.727 m)   Wt (!) 189.1 kg  SpO2 91%   BMI 63.40 kg/m   General: 67 y.o. year-old female well developed well nourished in no acute distress.  Alert and oriented x3. HEENT: NCAT, EOMI Neck: Supple, trachea medial Cardiovascular: Regular rate and rhythm with no rubs or gallops.  No thyromegaly or JVD noted.  No lower extremity edema. 2/4 pulses in all 4 extremities. Respiratory: Scattered expiratory wheezes on auscultation.  No rales.  Abdomen: Soft, obese, nontender nondistended with normal bowel sounds x4 quadrants. Muskuloskeletal: No cyanosis, clubbing or edema noted bilaterally Neuro: CN II-XII intact, strength 5/5 x 4, sensation, reflexes intact Skin: No ulcerative lesions noted or rashes Psychiatry: Judgement and insight appear normal. Mood is appropriate for condition and setting          Labs on Admission:  Basic Metabolic Panel: Recent Labs  Lab 02/25/23 1103  NA 134*  K 3.4*  CL 92*  CO2 34*  GLUCOSE 114*  BUN 20  CREATININE 1.22*  CALCIUM 9.8   Liver Function Tests: No results for input(s): "AST", "ALT", "ALKPHOS", "BILITOT", "PROT",  "ALBUMIN" in the last 168 hours. No results for input(s): "LIPASE", "AMYLASE" in the last 168 hours. No results for input(s): "AMMONIA" in the last 168 hours. CBC: Recent Labs  Lab 02/25/23 1103  WBC 10.9*  HGB 10.4*  HCT 35.0*  MCV 96.7  PLT 312   Cardiac Enzymes: No results for input(s): "CKTOTAL", "CKMB", "CKMBINDEX", "TROPONINI" in the last 168 hours.  BNP (last 3 results) Recent Labs    02/07/23 1725  BNP 118.5*    ProBNP (last 3 results) No results for input(s): "PROBNP" in the last 8760 hours.  CBG: No results for input(s): "GLUCAP" in the last 168 hours.  Radiological Exams on Admission: CT Angio Chest PE W and/or Wo Contrast  Result Date: 02/25/2023 CLINICAL DATA:  Pulmonary embolism (PE) suspected, high prob. Shortness of breath. Arm pain. EXAM: CT ANGIOGRAPHY CHEST WITH CONTRAST TECHNIQUE: Multidetector CT imaging of the chest was performed using the standard protocol during bolus administration of intravenous contrast. Multiplanar CT image reconstructions and MIPs were obtained to evaluate the vascular anatomy. RADIATION DOSE REDUCTION: This exam was performed according to the departmental dose-optimization program which includes automated exposure control, adjustment of the mA and/or kV according to patient size and/or use of iterative reconstruction technique. CONTRAST:  OMNIPAQUE IOHEXOL 350 MG/ML SOLN COMPARISON:  CT angiography chest from 02/07/2023. FINDINGS: Cardiovascular: Evaluation of pulmonary embolism beyond the segmental branches is limited due to suboptimal contrast-enhancement, patient's respiratory motion and streak artifacts from contrast within the superior vena cava. There is no embolism to the segmental pulmonary artery level. Mild cardiomegaly. No pericardial effusion. Redemonstration of lipomatous hypertrophy of the interatrial septum. No aortic aneurysm. There is dilation of the main pulmonary trunk measuring up to 4.1 cm, which is nonspecific  but can be seen with pulmonary artery hypertension. Mediastinum/Nodes: Visualized thyroid gland appears grossly unremarkable. Redemonstration of a 2.2 x 2.6 cm soft tissue attenuation structure in the anterior mediastinum, unchanged since the prior study dating back to July 28, 2018 and favored to represent a slightly complex/proteinaceous cyst. The esophagus is nondistended precluding optimal assessment. No axillary, mediastinal or hilar lymphadenopathy by size criteria. Lungs/Pleura: The central tracheo-bronchial tree is patent. There are patchy areas of linear, plate-like atelectasis and/or scarring throughout bilateral lungs. No mass or consolidation. No pleural effusion or pneumothorax. No suspicious lung nodules. Upper Abdomen: Visualized upper abdominal viscera within normal limits. Musculoskeletal: The visualized soft tissues of the chest wall are grossly  unremarkable. No suspicious osseous lesions. There are mild to moderate multilevel degenerative changes in the visualized spine. Review of the MIP images confirms the above findings. IMPRESSION: 1. No embolism noted to the segmental pulmonary artery level, as described above. 2. No lung mass, consolidation, pleural effusion or pneumothorax. 3. Multiple other nonacute observations, as described above. Electronically Signed   By: Jules Schick M.D.   On: 02/25/2023 17:06   DG Chest 2 View  Result Date: 02/25/2023 CLINICAL DATA:  Shortness of breath. EXAM: CHEST - 2 VIEW COMPARISON:  02/07/2023. FINDINGS: Low lung volume. Bilateral lung fields are clear. Bilateral costophrenic angles are clear. Stable cardio-mediastinal silhouette. There is mild convexity of the main pulmonary artery silhouette, nonspecific but can be seen with pulmonary artery hypertension. No acute osseous abnormalities. The soft tissues are within normal limits. IMPRESSION: *No active cardiopulmonary disease. Electronically Signed   By: Jules Schick M.D.   On: 02/25/2023 12:40     EKG: I independently viewed the EKG done and my findings are as followed: Normal sinus rhythm at a rate of 90 bpm  Assessment/Plan Present on Admission:  Acute on chronic respiratory failure with hypoxia and hypercapnia (HCC)  Asthma, chronic, unspecified asthma severity, with acute exacerbation  Depression  OSA (obstructive sleep apnea)  Obesity hypoventilation syndrome (HCC)  Principal Problem:   Acute on chronic respiratory failure with hypoxia and hypercapnia (HCC) Active Problems:   Depression   OSA (obstructive sleep apnea)   Obesity hypoventilation syndrome (HCC)   Asthma, chronic, unspecified asthma severity, with acute exacerbation   Hypokalemia   Morbid obesity with BMI of 60.0-69.9, adult (HCC)  Acute on chronic respiratory failure with hypoxia and hypercapnia Patient presents with hypoxia and hypercapnia Continue BiPAP and wean patient to baseline home oxygen level as tolerated  Acute asthma exacerbation Continue duo nebs, Mucinex, Solu-Medrol, azithromycin. Continue Protonix to prevent steroid-induced ulcer Continue incentive spirometry and flutter valve Continue supplemental oxygen to maintain O2 sat > 92% with plan to wean patient to home oxygen level as tolerated  OSA/OHS Patient states that she has only used CPAP once within the last week at the rehab facility Continue BiPAP as described above for hypoxia and hypercapnia  Hypokalemia K+ 3.4, this will be replenished  Depression Continue home meds  Morbid obesity (BMI 63.40) Diet and lifestyle modifications Patient may need to follow-up with PCP for weight loss program  DVT prophylaxis: Lovenox  Code Status: Full code  Family Communication: None at bedside  Consults: None  Severity of Illness: The appropriate patient status for this patient is OBSERVATION. Observation status is judged to be reasonable and necessary in order to provide the required intensity of service to ensure the patient's  safety. The patient's presenting symptoms, physical exam findings, and initial radiographic and laboratory data in the context of their medical condition is felt to place them at decreased risk for further clinical deterioration. Furthermore, it is anticipated that the patient will be medically stable for discharge from the hospital within 2 midnights of admission.   Author: Frankey Shown, DO 02/25/2023 11:12 PM  For on call review [email or web address].

## 2023-02-25 NOTE — ED Triage Notes (Signed)
PT BIB Ems coming from [facility] c/o [facility], Having arm pain and states having torn menisci and believes that this triggered her asthma attack. Pain 8/10 States that she was recently diagnosed with Pancreatitis. Currently on [place] @ 6L at 93%  BP 126/72, HR 88 Spo2 87 RA, 93 %,  20g L Ac IV - 2g mag, 12 mg, solumedrol, 1 neb, PTA, 3 duonebs

## 2023-02-25 NOTE — Progress Notes (Signed)
   02/25/23 2344  BiPAP/CPAP/SIPAP  BiPAP/CPAP/SIPAP Pt Type Adult  BiPAP/CPAP/SIPAP V60  Mask Type Full face mask  Mask Size Medium  Set Rate 20 breaths/min  Respiratory Rate 21 breaths/min  IPAP 20 cmH20  EPAP 5 cmH2O  FiO2 (%) (S)  50 %  Minute Ventilation 15  Leak 4  Peak Inspiratory Pressure (PIP) 18  Tidal Volume (Vt) 695  Patient Home Equipment No  Auto Titrate No  Press High Alarm 35 cmH2O  Press Low Alarm 5 cmH2O  Oxygen Percent 50 %  BiPAP/CPAP /SiPAP Vitals  Pulse Rate 85  Resp (!) 21  BP (!) 183/91  SpO2 96 %  Bilateral Breath Sounds Diminished  MEWS Score/Color  MEWS Score 1  MEWS Score Color Green

## 2023-02-25 NOTE — Progress Notes (Signed)
   02/25/23 1950  BiPAP/CPAP/SIPAP  BiPAP/CPAP/SIPAP Pt Type Adult  BiPAP/CPAP/SIPAP V60  Mask Type Full face mask  Mask Size Medium  Set Rate (S)  20 breaths/min (changes made per MD verbal order Dr Tonia Brooms)  Respiratory Rate 22 breaths/min  IPAP (S)  20 cmH20  EPAP (S)  5 cmH2O  FiO2 (%) 55 %  BiPAP/CPAP /SiPAP Vitals  Pulse Rate 98  Resp 19  SpO2 99 %  MEWS Score/Color  MEWS Score 0  MEWS Score Color [place]

## 2023-02-25 NOTE — ED Notes (Signed)
Respiratory was called to come and check patients mask   it seems to small for her face   I have had to reposition it many times to get a seal

## 2023-02-26 ENCOUNTER — Ambulatory Visit: Payer: 59 | Admitting: Family Medicine

## 2023-02-26 DIAGNOSIS — D72829 Elevated white blood cell count, unspecified: Secondary | ICD-10-CM | POA: Diagnosis present

## 2023-02-26 DIAGNOSIS — E8729 Other acidosis: Secondary | ICD-10-CM | POA: Diagnosis present

## 2023-02-26 DIAGNOSIS — R0602 Shortness of breath: Secondary | ICD-10-CM | POA: Diagnosis present

## 2023-02-26 DIAGNOSIS — J9622 Acute and chronic respiratory failure with hypercapnia: Secondary | ICD-10-CM | POA: Diagnosis present

## 2023-02-26 DIAGNOSIS — Z1152 Encounter for screening for COVID-19: Secondary | ICD-10-CM | POA: Diagnosis not present

## 2023-02-26 DIAGNOSIS — Z888 Allergy status to other drugs, medicaments and biological substances status: Secondary | ICD-10-CM | POA: Diagnosis not present

## 2023-02-26 DIAGNOSIS — J9621 Acute and chronic respiratory failure with hypoxia: Secondary | ICD-10-CM | POA: Diagnosis present

## 2023-02-26 DIAGNOSIS — J45901 Unspecified asthma with (acute) exacerbation: Secondary | ICD-10-CM | POA: Diagnosis present

## 2023-02-26 DIAGNOSIS — Z79899 Other long term (current) drug therapy: Secondary | ICD-10-CM | POA: Diagnosis not present

## 2023-02-26 DIAGNOSIS — E876 Hypokalemia: Secondary | ICD-10-CM | POA: Diagnosis present

## 2023-02-26 DIAGNOSIS — G8929 Other chronic pain: Secondary | ICD-10-CM | POA: Diagnosis present

## 2023-02-26 DIAGNOSIS — Z7951 Long term (current) use of inhaled steroids: Secondary | ICD-10-CM | POA: Diagnosis not present

## 2023-02-26 DIAGNOSIS — F32A Depression, unspecified: Secondary | ICD-10-CM | POA: Diagnosis present

## 2023-02-26 DIAGNOSIS — Z993 Dependence on wheelchair: Secondary | ICD-10-CM | POA: Diagnosis not present

## 2023-02-26 DIAGNOSIS — Z6841 Body Mass Index (BMI) 40.0 and over, adult: Secondary | ICD-10-CM | POA: Diagnosis not present

## 2023-02-26 DIAGNOSIS — G4733 Obstructive sleep apnea (adult) (pediatric): Secondary | ICD-10-CM | POA: Diagnosis not present

## 2023-02-26 DIAGNOSIS — Z882 Allergy status to sulfonamides status: Secondary | ICD-10-CM | POA: Diagnosis not present

## 2023-02-26 DIAGNOSIS — Z91199 Patient's noncompliance with other medical treatment and regimen due to unspecified reason: Secondary | ICD-10-CM | POA: Diagnosis not present

## 2023-02-26 DIAGNOSIS — I110 Hypertensive heart disease with heart failure: Secondary | ICD-10-CM | POA: Diagnosis present

## 2023-02-26 DIAGNOSIS — Z8249 Family history of ischemic heart disease and other diseases of the circulatory system: Secondary | ICD-10-CM | POA: Diagnosis not present

## 2023-02-26 DIAGNOSIS — E662 Morbid (severe) obesity with alveolar hypoventilation: Secondary | ICD-10-CM | POA: Diagnosis present

## 2023-02-26 DIAGNOSIS — I5032 Chronic diastolic (congestive) heart failure: Secondary | ICD-10-CM | POA: Diagnosis present

## 2023-02-26 LAB — CBC
HCT: 32.7 % — ABNORMAL LOW (ref 36.0–46.0)
HCT: 35 % — ABNORMAL LOW (ref 36.0–46.0)
Hemoglobin: 10 g/dL — ABNORMAL LOW (ref 12.0–15.0)
Hemoglobin: 10.6 g/dL — ABNORMAL LOW (ref 12.0–15.0)
MCH: 28.9 pg (ref 26.0–34.0)
MCH: 29.1 pg (ref 26.0–34.0)
MCHC: 30.3 g/dL (ref 30.0–36.0)
MCHC: 30.6 g/dL (ref 30.0–36.0)
MCV: 94.5 fL (ref 80.0–100.0)
MCV: 96.2 fL (ref 80.0–100.0)
Platelets: 285 10*3/uL (ref 150–400)
Platelets: 321 10*3/uL (ref 150–400)
RBC: 3.46 MIL/uL — ABNORMAL LOW (ref 3.87–5.11)
RBC: 3.64 MIL/uL — ABNORMAL LOW (ref 3.87–5.11)
RDW: 12.3 % (ref 11.5–15.5)
RDW: 12.4 % (ref 11.5–15.5)
WBC: 10.4 10*3/uL (ref 4.0–10.5)
WBC: 8.9 10*3/uL (ref 4.0–10.5)
nRBC: 0 % (ref 0.0–0.2)
nRBC: 0 % (ref 0.0–0.2)

## 2023-02-26 LAB — COMPREHENSIVE METABOLIC PANEL
ALT: 11 U/L (ref 0–44)
AST: 13 U/L — ABNORMAL LOW (ref 15–41)
Albumin: 3.6 g/dL (ref 3.5–5.0)
Alkaline Phosphatase: 63 U/L (ref 38–126)
Anion gap: 9 (ref 5–15)
BUN: 19 mg/dL (ref 8–23)
CO2: 34 mmol/L — ABNORMAL HIGH (ref 22–32)
Calcium: 10.2 mg/dL (ref 8.9–10.3)
Chloride: 93 mmol/L — ABNORMAL LOW (ref 98–111)
Creatinine, Ser: 0.98 mg/dL (ref 0.44–1.00)
GFR, Estimated: >60 mL/min (ref >60)
Glucose, Bld: 140 mg/dL — ABNORMAL HIGH (ref 70–99)
Potassium: 4.3 mmol/L (ref 3.5–5.1)
Sodium: 136 mmol/L (ref 135–145)
Total Bilirubin: 0.6 mg/dL (ref <1.2)
Total Protein: 7.7 g/dL (ref 6.5–8.1)

## 2023-02-26 LAB — GLUCOSE, CAPILLARY: Glucose-Capillary: 127 mg/dL — ABNORMAL HIGH (ref 70–99)

## 2023-02-26 LAB — CREATININE, SERUM
Creatinine, Ser: 1.01 mg/dL — ABNORMAL HIGH (ref 0.44–1.00)
GFR, Estimated: >60 mL/min (ref >60)

## 2023-02-26 LAB — PHOSPHORUS: Phosphorus: 3.4 mg/dL (ref 2.5–4.6)

## 2023-02-26 LAB — MAGNESIUM: Magnesium: 2.5 mg/dL — ABNORMAL HIGH (ref 1.7–2.4)

## 2023-02-26 MED ORDER — BUDESONIDE 0.5 MG/2ML IN SUSP
0.5000 mg | Freq: Two times a day (BID) | RESPIRATORY_TRACT | Status: DC
  Administered 2023-02-26 – 2023-03-06 (×15): 0.5 mg via RESPIRATORY_TRACT
  Filled 2023-02-26 (×16): qty 2

## 2023-02-26 MED ORDER — FLUTICASONE PROPIONATE 50 MCG/ACT NA SUSP
2.0000 | Freq: Every day | NASAL | Status: DC
Start: 2023-02-26 — End: 2023-03-06
  Administered 2023-02-26 – 2023-03-06 (×9): 2 via NASAL
  Filled 2023-02-26: qty 16

## 2023-02-26 MED ORDER — DM-GUAIFENESIN ER 30-600 MG PO TB12
2.0000 | ORAL_TABLET | Freq: Two times a day (BID) | ORAL | Status: DC
  Administered 2023-02-26 – 2023-03-06 (×17): 2 via ORAL
  Filled 2023-02-26 (×17): qty 2

## 2023-02-26 MED ORDER — FUROSEMIDE 10 MG/ML IJ SOLN
40.0000 mg | Freq: Two times a day (BID) | INTRAMUSCULAR | Status: DC
  Administered 2023-02-26 – 2023-02-28 (×4): 40 mg via INTRAVENOUS
  Filled 2023-02-26 (×4): qty 4

## 2023-02-26 MED ORDER — ACETAZOLAMIDE 250 MG PO TABS
500.0000 mg | ORAL_TABLET | Freq: Two times a day (BID) | ORAL | Status: DC
  Administered 2023-02-26 – 2023-03-03 (×11): 500 mg via ORAL
  Filled 2023-02-26 (×12): qty 2

## 2023-02-26 NOTE — Progress Notes (Signed)
   No charge note. See PN by Dr. Janee Morn for today. Transferred to [facility] from [facility] ER. Admitted for acute on chronic hypercapnic and hypoxic respiratory failure.  Pt is morbidly obese. BMI 63.4 Has chronic respiratory failure with hypercapnia and hypoxia. Normally wears O2 3 L/min. Baseline PCO2 around 70-80 mmg.  After Bipap, VBG showed Lab Results  Component Value Date/Time   PHVEN 7.41 02/25/2023 08:51 PM   PCO2VEN 65 (H) 02/25/2023 08:51 PM   PO2VEN 46 (H) 02/25/2023 08:51 PM   HCO3 41.2 (H) 02/25/2023 08:51 PM   O2SAT 78.6 02/25/2023 08:51 PM   With some LE edema. Will Rx IV lasix to help with edema and PO diamox to help her waste her bicarb. Hopefully this will also help drive down her total body CO2 content.\  Continue with Bipap as needed.  Carollee Herter, DO [facility]

## 2023-02-26 NOTE — Progress Notes (Signed)
   02/26/23 1533  TOC Brief Assessment  Insurance and Status Reviewed [facility] Dual Complete)  Patient has primary care physician Yes  Home environment has been reviewed From [facility] PTA but wants to return home  Prior level of function: Dependent   Uses Rollator  Prior/Current Home Services No current home services  Social Determinants of Health Reivew SDOH reviewed no interventions necessary  Readmission risk has been reviewed Yes (19%)  Transition of care needs transition of care needs identified, TOC will continue to follow   Patient states that she does not want to return to SNF and wants to go home. States that her Son will be available to be caregiver. Patient has Rollator and is requesting a rolling walker and a BSC.  She is also requesting a new Bipap and tub bench. RNCM explained to patient that these items are by Dr order  and that we will follow for recommendations and provide her with what she needs. Patient states she has O2 provided by [employer] and had a Bipap thru [place] but wants to switch to [employer] for Bipap .  She is also requesting a portable that she can carry on her back- RNCM explained that  insurance may not pay for that type of portable.   TOC will continue to follow patient for any additional discharge needs

## 2023-02-26 NOTE — Progress Notes (Signed)
PROGRESS NOTE    Janice Ryan  WUJ:[id number] DOB: 06-27-1955 DOA: 02/25/2023 PCP: [name], NP    Chief Complaint  Patient presents with   Shortness of Breath    Brief Narrative:  Patient 67 year old female with history of asthma, OSA/OHS uses BiPAP at night, chronic hypoxic hypercapnic respiratory failure on supplemental baseline O2 of 5 L nasal cannula, allergic rhinitis, chronic right knee pain presented to the ED from SNF due to shortness of breath.  Patient also noted to have presented from rehab facility due to worsening right knee pain after not receiving medications on day of admission with worsening shortness of breath and increased work of breathing.  Per admitting physician on EMS arrival patient in respiratory distress, given a breathing treatment and had to be placed on the BiPAP in the ED with improved work of bleeding and improvement of repeated VBG.  Influenza A, B, SARS coronavirus 2, RSV was negative.  CT angiogram chest negative for PE or infiltrate.  Patient admitted, placed on BiPAP, IV steroids, nebulizer treatments.   Assessment & Plan:   Principal Problem:   Acute on chronic respiratory failure with hypoxia and hypercapnia (HCC) Active Problems:   Depression   OSA (obstructive sleep apnea)   Obesity hypoventilation syndrome (HCC)   Asthma, chronic, unspecified asthma severity, with acute exacerbation   Hypokalemia   Morbid obesity with BMI of 60.0-69.9, adult (HCC)   #1 acute on chronic respiratory failure with hypoxia and hypercapnia secondary to acute asthma exacerbation, OHS/OSA -Patient presenting with worsening shortness of breath, hypoxia and hypercapnia, increased work of breathing with increased O2 requirement from baseline home O2. -Patient noted on admission to have some diffuse wheezing.  Chest x-ray unremarkable. -CT angiogram chest negative for PE or acute infiltrate. -VBG done noted a pH of 7.33/pCO2 of 82/pO2 of 52/bicarb of 43  prior to BiPAP. -Patient placed on BiPAP with repeat VBG improved with a pH of 7.4/pCO2 of 65/pO2 of 46/bicarb of 41. -Patient with clinical improvement. -Continue IV Solu-Medrol, scheduled DuoNebs, Singulair, PPI, azithromycin. -Place on Pulmicort, Flonase, increase Mucinex to 2 tabs twice daily. -Trial of BiPAP today.  2.  Hypokalemia -Repleted.  3.  OSA/OHS -It is noted per admitting physician that patient stated he used CPAP once within the last week at the rehab facility. -Currently on BiPAP, trial of BiPAP. -Will likely need BiPAP nightly.  4.  Depression -Continue home regimen Elavil, BuSpar, Cymbalta, Remeron.  5.  Morbid obesity -BMI 63.40 kg/m -Lifestyle modification -Outpatient follow-up.   DVT prophylaxis: Lovenox Code Status: Full Family Communication: Updated patient.  No family at bedside. Disposition: Back to skilled nursing facility when medically stable and back at baseline hopefully in the next 24 to 48 hours.  Status is: Inpatient The patient will require care spanning > 2 midnights and should be moved to inpatient because: Severity of illness   Consultants:  None  Procedures:  CT angiogram chest 02/25/2023 Chest x-ray 02/25/2020   Antimicrobials:  Anti-infectives (From admission, onward)    Start     Dose/Rate Route Frequency Ordered Stop   02/27/23 1000  azithromycin (ZITHROMAX) tablet 250 mg       Placed in "Followed by" Linked Group   250 mg Oral Daily 02/25/23 2302 03/03/23 0959   02/26/23 1000  azithromycin (ZITHROMAX) tablet 500 mg       Placed in "Followed by" Linked Group   500 mg Oral Daily 02/25/23 2302 02/26/23 1333         Subjective:  Patient sleeping on BiPAP easily arousable.  States she is feeling better than she did on admission and feels her sats are about 95%.  Denies any chest pain.  Feels breathing is improving.  No abdominal pain.  Asking for water.  Objective: Vitals:   02/26/23 0812 02/26/23 1108 02/26/23 1133  02/26/23 1432  BP: (!) 135/54 (!) 121/59    Pulse: 63 84    Resp: 18 20    Temp: 98 F (36.7 C)     TempSrc: Oral     SpO2: 93% 93% 92% 95%  Weight:      Height:       No intake or output data in the 24 hours ending 02/26/23 1500 Filed Weights   02/25/23 1107  Weight: (!) 189.1 kg    Examination:  General exam: Appears calm and comfortable  Respiratory system: Minimal expiratory wheezing.  Fair air movement.  No use of accessory muscles of respiration.  Speaking in full sentences.  On BiPAP.  Cardiovascular system: S1 & S2 heard, RRR. No JVD, murmurs, rubs, gallops or clicks.  No pitting lower extremity edema.   Gastrointestinal system: Abdomen is nondistended, obese, soft and nontender. No organomegaly or masses felt. Normal bowel sounds heard. Central nervous system: Alert and oriented. No focal neurological deficits. Extremities: Symmetric 5 x 5 power. Skin: No rashes, lesions or ulcers Psychiatry: Judgement and insight appear normal. Mood & affect appropriate.     Data Reviewed: I have personally reviewed following labs and imaging studies  CBC: Recent Labs  Lab 02/25/23 1103 02/25/23 2331 02/26/23 0600  WBC 10.9* 8.9 10.4  HGB 10.4* 10.6* 10.0*  HCT 35.0* 35.0* 32.7*  MCV 96.7 96.2 94.5  PLT 312 321 285    Basic Metabolic Panel: Recent Labs  Lab 02/25/23 1103 02/25/23 2331 02/26/23 0600  NA 134*  --  136  K 3.4*  --  4.3  CL 92*  --  93*  CO2 34*  --  34*  GLUCOSE 114*  --  140*  BUN 20  --  19  CREATININE 1.22* 1.01* 0.98  CALCIUM 9.8  --  10.2  MG  --   --  2.5*  PHOS  --   --  3.4    GFR: Estimated Creatinine Clearance: 103 mL/min (by C-G formula based on SCr of 0.98 mg/dL).  Liver Function Tests: Recent Labs  Lab 02/26/23 0600  AST 13*  ALT 11  ALKPHOS 63  BILITOT 0.6  PROT 7.7  ALBUMIN 3.6    CBG: No results for input(s): "GLUCAP" in the last 168 hours.   Recent Results (from the past 240 hour(s))  Resp panel by RT-PCR (RSV,  Flu A&B, Covid) Anterior Nasal Swab     Status: None   Collection Time: 02/25/23  2:19 PM   Specimen: Anterior Nasal Swab  Result Value Ref Range Status   SARS Coronavirus 2 by RT PCR NEGATIVE NEGATIVE Final    Comment: (NOTE) SARS-CoV-2 target nucleic acids are NOT DETECTED.  The SARS-CoV-2 RNA is generally detectable in upper respiratory specimens during the acute phase of infection. The lowest concentration of SARS-CoV-2 viral copies this assay can detect is 138 copies/mL. A negative result does not preclude SARS-Cov-2 infection and should not be used as the sole basis for treatment or other patient management decisions. A negative result may occur with  improper specimen collection/handling, submission of specimen other than nasopharyngeal swab, presence of viral mutation(s) within the areas targeted by this assay, and inadequate  number of viral copies(<138 copies/mL). A negative result must be combined with clinical observations, patient history, and epidemiological information. The expected result is Negative.  Fact Sheet for Patients:  BloggerCourse.com  Fact Sheet for Healthcare Providers:  SeriousBroker.it  This test is no t yet approved or cleared by the [place] FDA and  has been authorized for detection and/or diagnosis of SARS-CoV-2 by FDA under an Emergency Use Authorization (EUA). This EUA will remain  in effect (meaning this test can be used) for the duration of the COVID-19 declaration under Section 564(b)(1) of the Act, 21 U.S.C.section 360bbb-3(b)(1), unless the authorization is terminated  or revoked sooner.       Influenza A by PCR NEGATIVE NEGATIVE Final   Influenza B by PCR NEGATIVE NEGATIVE Final    Comment: (NOTE) The Xpert Xpress SARS-CoV-2/FLU/RSV plus assay is intended as an aid in the diagnosis of influenza from Nasopharyngeal swab specimens and should not be used as a sole basis for  treatment. Nasal washings and aspirates are unacceptable for Xpert Xpress SARS-CoV-2/FLU/RSV testing.  Fact Sheet for Patients: BloggerCourse.com  Fact Sheet for Healthcare Providers: SeriousBroker.it  This test is not yet approved or cleared by the [place] FDA and has been authorized for detection and/or diagnosis of SARS-CoV-2 by FDA under an Emergency Use Authorization (EUA). This EUA will remain in effect (meaning this test can be used) for the duration of the COVID-19 declaration under Section 564(b)(1) of the Act, 21 U.S.C. section 360bbb-3(b)(1), unless the authorization is terminated or revoked.     Resp Syncytial Virus by PCR NEGATIVE NEGATIVE Final    Comment: (NOTE) Fact Sheet for Patients: BloggerCourse.com  Fact Sheet for Healthcare Providers: SeriousBroker.it  This test is not yet approved or cleared by the [place] FDA and has been authorized for detection and/or diagnosis of SARS-CoV-2 by FDA under an Emergency Use Authorization (EUA). This EUA will remain in effect (meaning this test can be used) for the duration of the COVID-19 declaration under Section 564(b)(1) of the Act, 21 U.S.C. section 360bbb-3(b)(1), unless the authorization is terminated or revoked.  Performed at [facility], [address] [place], [place] [address]          Radiology Studies: CT Angio Chest PE W and/or Wo Contrast  Result Date: 02/25/2023 CLINICAL DATA:  Pulmonary embolism (PE) suspected, high prob. Shortness of breath. Arm pain. EXAM: CT ANGIOGRAPHY CHEST WITH CONTRAST TECHNIQUE: Multidetector CT imaging of the chest was performed using the standard protocol during bolus administration of intravenous contrast. Multiplanar CT image reconstructions and MIPs were obtained to evaluate the vascular anatomy. RADIATION DOSE REDUCTION: This exam was  performed according to the departmental dose-optimization program which includes automated exposure control, adjustment of the mA and/or kV according to patient size and/or use of iterative reconstruction technique. CONTRAST:  OMNIPAQUE IOHEXOL 350 MG/ML SOLN COMPARISON:  CT angiography chest from 02/07/2023. FINDINGS: Cardiovascular: Evaluation of pulmonary embolism beyond the segmental branches is limited due to suboptimal contrast-enhancement, patient's respiratory motion and streak artifacts from contrast within the superior vena cava. There is no embolism to the segmental pulmonary artery level. Mild cardiomegaly. No pericardial effusion. Redemonstration of lipomatous hypertrophy of the interatrial septum. No aortic aneurysm. There is dilation of the main pulmonary trunk measuring up to 4.1 cm, which is nonspecific but can be seen with pulmonary artery hypertension. Mediastinum/Nodes: Visualized thyroid gland appears grossly unremarkable. Redemonstration of a 2.2 x 2.6 cm soft tissue attenuation structure in the anterior mediastinum, unchanged  since the prior study dating back to July 28, 2018 and favored to represent a slightly complex/proteinaceous cyst. The esophagus is nondistended precluding optimal assessment. No axillary, mediastinal or hilar lymphadenopathy by size criteria. Lungs/Pleura: The central tracheo-bronchial tree is patent. There are patchy areas of linear, plate-like atelectasis and/or scarring throughout bilateral lungs. No mass or consolidation. No pleural effusion or pneumothorax. No suspicious lung nodules. Upper Abdomen: Visualized upper abdominal viscera within normal limits. Musculoskeletal: The visualized soft tissues of the chest wall are grossly unremarkable. No suspicious osseous lesions. There are mild to moderate multilevel degenerative changes in the visualized spine. Review of the MIP images confirms the above findings. IMPRESSION: 1. No embolism noted to the segmental  pulmonary artery level, as described above. 2. No lung mass, consolidation, pleural effusion or pneumothorax. 3. Multiple other nonacute observations, as described above. Electronically Signed   By: Jules Schick M.D.   On: 02/25/2023 17:06   DG Chest 2 View  Result Date: 02/25/2023 CLINICAL DATA:  Shortness of breath. EXAM: CHEST - 2 VIEW COMPARISON:  02/07/2023. FINDINGS: Low lung volume. Bilateral lung fields are clear. Bilateral costophrenic angles are clear. Stable cardio-mediastinal silhouette. There is mild convexity of the main pulmonary artery silhouette, nonspecific but can be seen with pulmonary artery hypertension. No acute osseous abnormalities. The soft tissues are within normal limits. IMPRESSION: *No active cardiopulmonary disease. Electronically Signed   By: Jules Schick M.D.   On: 02/25/2023 12:40        Scheduled Meds:  acetaZOLAMIDE  500 mg Oral BID   amitriptyline  25 mg Oral QHS   [START ON 02/27/2023] azithromycin  250 mg Oral Daily   budesonide (PULMICORT) nebulizer solution  0.5 mg Nebulization BID   busPIRone  10 mg Oral TID   dextromethorphan-guaiFENesin  2 tablet Oral BID   doxepin  50 mg Oral Daily   DULoxetine  120 mg Oral Daily   enoxaparin (LOVENOX) injection  90 mg Subcutaneous Q24H   fluticasone  2 spray Each Nare Daily   furosemide  40 mg Intravenous BID   ipratropium-albuterol  3 mL Nebulization Q6H   methylPREDNISolone (SOLU-MEDROL) injection  60 mg Intravenous Q12H   mirtazapine  15 mg Oral QHS   montelukast  10 mg Oral QHS   pantoprazole  40 mg Oral Daily   Continuous Infusions:   LOS: 0 days    Time spent: 40 minutes    Ramiro Harvest, MD [facility]   To contact the attending provider between 7A-7P or the covering provider during after hours 7P-7A, please log into the web site [email or web address] and access using universal [place] password for that web site. If you do not have the password, please call the hospital  operator.  02/26/2023, 3:00 PM

## 2023-02-26 NOTE — ED Notes (Signed)
This RN notified respiratory of SpO2 dropping to 88-89% while pt is sleeping. Respiratory states that this is okay since she is resting and advised this RN to notify her if it goes below 87-88%. This RN changed pulse ox cord too. Pt is not in any distress at this time.

## 2023-02-26 NOTE — ED Notes (Deleted)
Pt tolerated PO med and water with med

## 2023-02-26 NOTE — ED Notes (Signed)
ED TO INPATIENT HANDOFF REPORT  Name/Age/Gender Janice Ryan 67 y.o. female  Code Status    Code Status Orders  (From admission, onward)           Start     Ordered   02/25/23 2308  Full code  Continuous       Question:  By:  Answer:  Consent: discussion documented in EHR   02/25/23 2309           Code Status History     Date Active Date Inactive Code Status Order ID Comments User Context   02/07/2023 2341 02/17/2023 0254 Full Code [id number]  John Giovanni, MD ED   01/21/2022 0038 01/30/2022 0006 Full Code [id number]  Evelena Leyden, DO ED   03/20/2019 2008 04/03/2019 0417 Full Code [id number]  Charlsie Quest, MD ED   07/28/2018 0117 07/31/2018 1900 Full Code [id number]  Eulah Pont, MD ED       Home/SNF/Other Home  Chief Complaint Acute on chronic respiratory failure with hypoxia and hypercapnia (HCC) [U72.53, J96.22]  Level of Care/Admitting Diagnosis ED Disposition     ED Disposition  Admit   Condition  --   Comment  Hospital Area: [facility] [[id number]]  Level of Care: Progressive [102]  Admit to Progressive based on following criteria: RESPIRATORY PROBLEMS hypoxemic/hypercapnic respiratory failure that is responsive to NIPPV (BiPAP) or High Flow Nasal Cannula (6-80 lpm). Frequent assessment/intervention, no > Q2 hrs < Q4 hrs, to maintain oxygenation and pulmonary hygiene.  May place patient in observation at [facility] or [facility] if equivalent level of care is available:: Yes  Covid Evaluation: Asymptomatic - no recent exposure (last 10 days) testing not required  Diagnosis: Acute on chronic respiratory failure with hypoxia and hypercapnia [facility]) [[id number]]  Admitting Physician: Frankey Shown [[id number]]  Attending Physician: Frankey Shown [[id number]]          Medical History Past Medical History:  Diagnosis Date   Acute respiratory failure with hypoxia (HCC)    Asthma    Hypertension    Morbid obesity (HCC)     Obesity with alveolar hypoventilation and body mass index (BMI) of 40 or greater (HCC)    OSA (obstructive sleep apnea)     Allergies Allergies  Allergen Reactions   Neurontin [Gabapentin] Other (See Comments)    Has un-controlled movement of her body, excessive dribble of saliva at night   Sulfa Antibiotics Nausea And Vomiting and Other (See Comments)    Stomach pain    IV Location/Drains/Wounds Patient Lines/Drains/Airways Status     Active Line/Drains/Airways     Name Placement date Placement time Site Days   Peripheral IV 02/25/23 20 G Left Antecubital 02/25/23  0000  Antecubital  1   Wound / Incision (Open or Dehisced) 01/23/22 Irritant Dermatitis (Moisture Associated Skin Damage) Other (Comment) Right;Upper pink, moist, odor 01/23/22  0400  Other (Comment)  399            Labs/Imaging Results for orders placed or performed during the hospital encounter of 02/25/23 (from the past 48 hour(s))  CBC     Status: Abnormal   Collection Time: 02/25/23 11:03 AM  Result Value Ref Range   WBC 10.9 (H) 4.0 - 10.5 K/uL   RBC 3.62 (L) 3.87 - 5.11 MIL/uL   Hemoglobin 10.4 (L) 12.0 - 15.0 g/dL   HCT 32.9 (L) 51.8 - 84.1 %   MCV 96.7 80.0 - 100.0 fL   MCH 28.7 26.0 - 34.0 pg  MCHC 29.7 (L) 30.0 - 36.0 g/dL   RDW 16.1 09.6 - 04.5 %   Platelets 312 150 - 400 K/uL   nRBC 0.0 0.0 - 0.2 %    Comment: Performed at [facility], [address] [place], [place] [address]  Basic metabolic panel     Status: Abnormal   Collection Time: 02/25/23 11:03 AM  Result Value Ref Range   Sodium 134 (L) 135 - 145 mmol/L   Potassium 3.4 (L) 3.5 - 5.1 mmol/L   Chloride 92 (L) 98 - 111 mmol/L   CO2 34 (H) 22 - 32 mmol/L   Glucose, Bld 114 (H) 70 - 99 mg/dL    Comment: Glucose reference range applies only to samples taken after fasting for at least 8 hours.   BUN 20 8 - 23 mg/dL   Creatinine, Ser 1.91 (H) 0.44 - 1.00 mg/dL   Calcium 9.8 8.9 - 47.8 mg/dL   GFR, Estimated 49  (L) >60 mL/min    Comment: (NOTE) Calculated using the CKD-EPI Creatinine Equation (2021)    Anion gap 8 5 - 15    Comment: Performed at [facility], [address] [place], [place] [address]  Blood gas, venous (at [facility] and [facility])     Status: Abnormal   Collection Time: 02/25/23 12:01 PM  Result Value Ref Range   pH, Ven 7.4 7.25 - 7.43   pCO2, Ven 65 (H) 44 - 60 mmHg   pO2, Ven 66 (H) 32 - 45 mmHg   Bicarbonate 40.3 (H) 20.0 - 28.0 mmol/L   Acid-Base Excess 12.5 (H) 0.0 - 2.0 mmol/L   O2 Saturation 94.8 %   Patient temperature 37.0     Comment: Performed at [facility], [address], [place], [place] [address]  Lactic acid, plasma     Status: None   Collection Time: 02/25/23 12:01 PM  Result Value Ref Range   Lactic Acid, Venous 1.2 0.5 - 1.9 mmol/L    Comment: Performed at [facility], [address] [place], [place] [address]  Resp panel by RT-PCR (RSV, Flu A&B, Covid) Anterior Nasal Swab     Status: None   Collection Time: 02/25/23  2:19 PM   Specimen: Anterior Nasal Swab  Result Value Ref Range   SARS Coronavirus 2 by RT PCR NEGATIVE NEGATIVE    Comment: (NOTE) SARS-CoV-2 target nucleic acids are NOT DETECTED.  The SARS-CoV-2 RNA is generally detectable in upper respiratory specimens during the acute phase of infection. The lowest concentration of SARS-CoV-2 viral copies this assay can detect is 138 copies/mL. A negative result does not preclude SARS-Cov-2 infection and should not be used as the sole basis for treatment or other patient management decisions. A negative result may occur with  improper specimen collection/handling, submission of specimen other than nasopharyngeal swab, presence of viral mutation(s) within the areas targeted by this assay, and inadequate number of viral copies(<138 copies/mL). A negative result must be combined with clinical observations, patient history, and  epidemiological information. The expected result is Negative.  Fact Sheet for Patients:  BloggerCourse.com  Fact Sheet for Healthcare Providers:  SeriousBroker.it  This test is no t yet approved or cleared by the [place] FDA and  has been authorized for detection and/or diagnosis of SARS-CoV-2 by FDA under an Emergency Use Authorization (EUA). This EUA will remain  in effect (meaning this test can be used) for the duration of the COVID-19 declaration under Section 564(b)(1)  of the Act, 21 U.S.C.section 360bbb-3(b)(1), unless the authorization is terminated  or revoked sooner.       Influenza A by PCR NEGATIVE NEGATIVE   Influenza B by PCR NEGATIVE NEGATIVE    Comment: (NOTE) The Xpert Xpress SARS-CoV-2/FLU/RSV plus assay is intended as an aid in the diagnosis of influenza from Nasopharyngeal swab specimens and should not be used as a sole basis for treatment. Nasal washings and aspirates are unacceptable for Xpert Xpress SARS-CoV-2/FLU/RSV testing.  Fact Sheet for Patients: BloggerCourse.com  Fact Sheet for Healthcare Providers: SeriousBroker.it  This test is not yet approved or cleared by the [place] FDA and has been authorized for detection and/or diagnosis of SARS-CoV-2 by FDA under an Emergency Use Authorization (EUA). This EUA will remain in effect (meaning this test can be used) for the duration of the COVID-19 declaration under Section 564(b)(1) of the Act, 21 U.S.C. section 360bbb-3(b)(1), unless the authorization is terminated or revoked.     Resp Syncytial Virus by PCR NEGATIVE NEGATIVE    Comment: (NOTE) Fact Sheet for Patients: BloggerCourse.com  Fact Sheet for Healthcare Providers: SeriousBroker.it  This test is not yet approved or cleared by the [place] FDA and has been authorized for  detection and/or diagnosis of SARS-CoV-2 by FDA under an Emergency Use Authorization (EUA). This EUA will remain in effect (meaning this test can be used) for the duration of the COVID-19 declaration under Section 564(b)(1) of the Act, 21 U.S.C. section 360bbb-3(b)(1), unless the authorization is terminated or revoked.  Performed at [facility], [address], [place], [place] [address]   Blood gas, venous (at [facility] and [facility])     Status: Abnormal   Collection Time: 02/25/23  7:05 PM  Result Value Ref Range   pH, Ven 7.33 7.25 - 7.43   pCO2, Ven 82 (HH) 44 - 60 mmHg    Comment: CRITICAL RESULT CALLED TO, READ BACK BY AND VERIFIED WITH: GARDNER E. @1933  02/25/24 MCLEAN K.    pO2, Ven 52 (H) 32 - 45 mmHg   Bicarbonate 43.2 (H) 20.0 - 28.0 mmol/L   Acid-Base Excess 13.4 (H) 0.0 - 2.0 mmol/L   O2 Saturation 84.1 %   Patient temperature 37.0     Comment: Performed at [facility], [address] [place], [place] [address]  Blood gas, venous (at [facility] and [facility])     Status: Abnormal   Collection Time: 02/25/23  8:51 PM  Result Value Ref Range   pH, Ven 7.41 7.25 - 7.43   pCO2, Ven 65 (H) 44 - 60 mmHg   pO2, Ven 46 (H) 32 - 45 mmHg   Bicarbonate 41.2 (H) 20.0 - 28.0 mmol/L   Acid-Base Excess 13.5 (H) 0.0 - 2.0 mmol/L   O2 Saturation 78.6 %   Patient temperature 37.0     Comment: Performed at [facility], [address] [place], [place] [address]  CBC     Status: Abnormal   Collection Time: 02/25/23 11:31 PM  Result Value Ref Range   WBC 8.9 4.0 - 10.5 K/uL   RBC 3.64 (L) 3.87 - 5.11 MIL/uL   Hemoglobin 10.6 (L) 12.0 - 15.0 g/dL   HCT 42.5 (L) 95.6 - 38.7 %   MCV 96.2 80.0 - 100.0 fL   MCH 29.1 26.0 - 34.0 pg   MCHC 30.3 30.0 - 36.0 g/dL   RDW 56.4 33.2 - 95.1 %   Platelets 321 150 - 400 K/uL   nRBC  0.0 0.0 - 0.2 %    Comment: Performed at [facility], [address], [place], [place] [address]  Creatinine,  serum     Status: Abnormal   Collection Time: 02/25/23 11:31 PM  Result Value Ref Range   Creatinine, Ser 1.01 (H) 0.44 - 1.00 mg/dL   GFR, Estimated >09 >81 mL/min    Comment: (NOTE) Calculated using the CKD-EPI Creatinine Equation (2021) Performed at [facility], [address], [place], [place] [address]   Comprehensive metabolic panel     Status: Abnormal   Collection Time: 02/26/23  6:00 AM  Result Value Ref Range   Sodium 136 135 - 145 mmol/L   Potassium 4.3 3.5 - 5.1 mmol/L   Chloride 93 (L) 98 - 111 mmol/L   CO2 34 (H) 22 - 32 mmol/L   Glucose, Bld 140 (H) 70 - 99 mg/dL    Comment: Glucose reference range applies only to samples taken after fasting for at least 8 hours.   BUN 19 8 - 23 mg/dL   Creatinine, Ser 8.29 0.44 - 1.00 mg/dL   Calcium 56.2 8.9 - 13.0 mg/dL   Total Protein 7.7 6.5 - 8.1 g/dL   Albumin 3.6 3.5 - 5.0 g/dL   AST 13 (L) 15 - 41 U/L   ALT 11 0 - 44 U/L   Alkaline Phosphatase 63 38 - 126 U/L   Total Bilirubin 0.6 <1.2 mg/dL   GFR, Estimated >86 >57 mL/min    Comment: (NOTE) Calculated using the CKD-EPI Creatinine Equation (2021)    Anion gap 9 5 - 15    Comment: Performed at [facility], [address], [place], [place] [address]  CBC     Status: Abnormal   Collection Time: 02/26/23  6:00 AM  Result Value Ref Range   WBC 10.4 4.0 - 10.5 K/uL   RBC 3.46 (L) 3.87 - 5.11 MIL/uL   Hemoglobin 10.0 (L) 12.0 - 15.0 g/dL   HCT 29.5 (L) 28.4 - 13.2 %   MCV 94.5 80.0 - 100.0 fL   MCH 28.9 26.0 - 34.0 pg   MCHC 30.6 30.0 - 36.0 g/dL   RDW 44.0 10.2 - 72.5 %   Platelets 285 150 - 400 K/uL   nRBC 0.0 0.0 - 0.2 %    Comment: Performed at [facility], [address] [place], [place] [address]  Magnesium     Status: Abnormal   Collection Time: 02/26/23  6:00 AM  Result Value Ref Range   Magnesium 2.5 (H) 1.7 - 2.4 mg/dL    Comment: Performed at [facility], [address] [place], [place] [address]  Phosphorus     Status: None   Collection Time: 02/26/23  6:00 AM  Result Value Ref Range   Phosphorus 3.4 2.5 - 4.6 mg/dL    Comment: Performed at [facility], [address] [place], [place] [address]   CT Angio Chest PE W and/or Wo Contrast  Result Date: 02/25/2023 CLINICAL DATA:  Pulmonary embolism (PE) suspected, high prob. Shortness of breath. Arm pain. EXAM: CT ANGIOGRAPHY CHEST WITH CONTRAST TECHNIQUE: Multidetector CT imaging of the chest was performed using the standard protocol during bolus administration of intravenous contrast. Multiplanar CT image reconstructions and MIPs were obtained to evaluate the vascular anatomy. RADIATION DOSE REDUCTION: This exam was performed according to the departmental dose-optimization program which includes automated exposure control, adjustment of the mA and/or kV according to  patient size and/or use of iterative reconstruction technique. CONTRAST:  OMNIPAQUE IOHEXOL 350 MG/ML SOLN COMPARISON:  CT angiography chest from 02/07/2023. FINDINGS: Cardiovascular: Evaluation of pulmonary embolism beyond the segmental branches is limited due to suboptimal contrast-enhancement, patient's respiratory motion and streak artifacts from contrast within the superior vena cava. There is no embolism to the segmental pulmonary artery level. Mild cardiomegaly. No pericardial effusion. Redemonstration of lipomatous hypertrophy of the interatrial septum. No aortic aneurysm. There is dilation of the main pulmonary trunk measuring up to 4.1 cm, which is nonspecific but can be seen with pulmonary artery hypertension. Mediastinum/Nodes: Visualized thyroid gland appears grossly unremarkable. Redemonstration of a 2.2 x 2.6 cm soft tissue attenuation structure in the anterior mediastinum, unchanged since the prior study dating back to July 28, 2018 and favored to represent a slightly complex/proteinaceous cyst. The esophagus is  nondistended precluding optimal assessment. No axillary, mediastinal or hilar lymphadenopathy by size criteria. Lungs/Pleura: The central tracheo-bronchial tree is patent. There are patchy areas of linear, plate-like atelectasis and/or scarring throughout bilateral lungs. No mass or consolidation. No pleural effusion or pneumothorax. No suspicious lung nodules. Upper Abdomen: Visualized upper abdominal viscera within normal limits. Musculoskeletal: The visualized soft tissues of the chest wall are grossly unremarkable. No suspicious osseous lesions. There are mild to moderate multilevel degenerative changes in the visualized spine. Review of the MIP images confirms the above findings. IMPRESSION: 1. No embolism noted to the segmental pulmonary artery level, as described above. 2. No lung mass, consolidation, pleural effusion or pneumothorax. 3. Multiple other nonacute observations, as described above. Electronically Signed   By: Jules Schick M.D.   On: 02/25/2023 17:06   DG Chest 2 View  Result Date: 02/25/2023 CLINICAL DATA:  Shortness of breath. EXAM: CHEST - 2 VIEW COMPARISON:  02/07/2023. FINDINGS: Low lung volume. Bilateral lung fields are clear. Bilateral costophrenic angles are clear. Stable cardio-mediastinal silhouette. There is mild convexity of the main pulmonary artery silhouette, nonspecific but can be seen with pulmonary artery hypertension. No acute osseous abnormalities. The soft tissues are within normal limits. IMPRESSION: *No active cardiopulmonary disease. Electronically Signed   By: Jules Schick M.D.   On: 02/25/2023 12:40    Pending Labs Unresulted Labs (From admission, onward)     Start     Ordered   03/04/23 0500  Creatinine, serum  (enoxaparin (LOVENOX)    CrCl >/= 30 ml/min)  Weekly,   R     Comments: while on enoxaparin therapy    02/25/23 2309            Vitals/Pain Today's Vitals   02/26/23 0706 02/26/23 0723 02/26/23 0811 02/26/23 0812  BP:  (!) 124/92 (!)  135/54 (!) 135/54  Pulse: 68  63 63  Resp: 13 20  18   Temp:    98 F (36.7 C)  TempSrc:    Oral  SpO2: 90% 94% 93% 93%  Weight:      Height:      PainSc:        Isolation Precautions No active isolations  Medications Medications  albuterol (PROVENTIL) (2.5 MG/3ML) 0.083% nebulizer solution (0 mg/hr Nebulization Stopped 02/25/23 2306)  busPIRone (BUSPAR) tablet 10 mg (10 mg Oral Given 02/26/23 0007)  amitriptyline (ELAVIL) tablet 25 mg (25 mg Oral Given 02/26/23 0007)  DULoxetine (CYMBALTA) DR capsule 120 mg (has no administration in time range)  doxepin (SINEQUAN) capsule 50 mg (has no administration in time range)  montelukast (SINGULAIR) tablet 10 mg (10 mg Oral Given 02/26/23  0007)  ipratropium-albuterol (DUONEB) 0.5-2.5 (3) MG/3ML nebulizer solution 3 mL (3 mLs Nebulization Given 02/26/23 0723)  ipratropium-albuterol (DUONEB) 0.5-2.5 (3) MG/3ML nebulizer solution 3 mL (has no administration in time range)  methylPREDNISolone sodium succinate (SOLU-MEDROL) 125 mg/2 mL injection 60 mg (60 mg Intravenous Given 02/25/23 2326)  pantoprazole (PROTONIX) EC tablet 40 mg (has no administration in time range)  azithromycin (ZITHROMAX) tablet 500 mg (has no administration in time range)    Followed by  azithromycin (ZITHROMAX) tablet 250 mg (has no administration in time range)  enoxaparin (LOVENOX) injection 90 mg (has no administration in time range)  acetaminophen (TYLENOL) tablet 650 mg (650 mg Oral Given 02/26/23 0141)    Or  acetaminophen (TYLENOL) suppository 650 mg ( Rectal See Alternative 02/26/23 0141)  ondansetron (ZOFRAN) tablet 4 mg (has no administration in time range)    Or  ondansetron (ZOFRAN) injection 4 mg (has no administration in time range)  mirtazapine (REMERON) tablet 15 mg (15 mg Oral Given 02/26/23 0007)  fluticasone (FLONASE) 50 MCG/ACT nasal spray 2 spray (has no administration in time range)  budesonide (PULMICORT) nebulizer solution 0.5 mg (has no  administration in time range)  dextromethorphan-guaiFENesin (MUCINEX DM) 30-600 MG per 12 hr tablet 2 tablet (has no administration in time range)  methylPREDNISolone sodium succinate (SOLU-MEDROL) 125 mg/2 mL injection 125 mg (125 mg Intravenous Given 02/25/23 1312)  ondansetron (ZOFRAN) injection 4 mg (4 mg Intravenous Given 02/25/23 1312)  morphine (PF) 4 MG/ML injection 4 mg (4 mg Intravenous Given 02/25/23 1312)  ipratropium-albuterol (DUONEB) 0.5-2.5 (3) MG/3ML nebulizer solution 3 mL (3 mLs Nebulization Given 02/25/23 1312)  iohexol (OMNIPAQUE) 350 MG/ML injection 75 mL (100 mLs Intravenous Contrast Given 02/25/23 1453)  magnesium sulfate IVPB 2 g 50 mL (0 g Intravenous Stopped 02/25/23 1903)  potassium chloride SA (KLOR-CON M) CR tablet 40 mEq (40 mEq Oral Given 02/25/23 2326)    Mobility non-ambulatory

## 2023-02-27 ENCOUNTER — Telehealth: Payer: Self-pay | Admitting: Family

## 2023-02-27 ENCOUNTER — Inpatient Hospital Stay (HOSPITAL_COMMUNITY): Payer: 59

## 2023-02-27 DIAGNOSIS — J9621 Acute and chronic respiratory failure with hypoxia: Secondary | ICD-10-CM | POA: Diagnosis not present

## 2023-02-27 DIAGNOSIS — J9622 Acute and chronic respiratory failure with hypercapnia: Secondary | ICD-10-CM | POA: Diagnosis not present

## 2023-02-27 LAB — C-REACTIVE PROTEIN: CRP: 1.2 mg/dL — ABNORMAL HIGH (ref <1.0)

## 2023-02-27 LAB — PROCALCITONIN: Procalcitonin: <0.1 ng/mL

## 2023-02-27 LAB — BRAIN NATRIURETIC PEPTIDE: B Natriuretic Peptide: 138.3 pg/mL — ABNORMAL HIGH (ref 0.0–100.0)

## 2023-02-27 LAB — GLUCOSE, CAPILLARY: Glucose-Capillary: 154 mg/dL — ABNORMAL HIGH (ref 70–99)

## 2023-02-27 MED ORDER — METHYLPREDNISOLONE SODIUM SUCC 125 MG IJ SOLR
80.0000 mg | Freq: Every day | INTRAMUSCULAR | Status: DC
  Administered 2023-02-27 – 2023-03-03 (×5): 80 mg via INTRAVENOUS
  Filled 2023-02-27 (×5): qty 2

## 2023-02-27 MED ORDER — IPRATROPIUM-ALBUTEROL 0.5-2.5 (3) MG/3ML IN SOLN
3.0000 mL | Freq: Two times a day (BID) | RESPIRATORY_TRACT | Status: DC
  Administered 2023-02-28 – 2023-03-06 (×12): 3 mL via RESPIRATORY_TRACT
  Filled 2023-02-27 (×13): qty 3

## 2023-02-27 NOTE — Evaluation (Signed)
Physical Therapy Evaluation Patient Details Name: Janice Ryan MRN: [id number] DOB: Sep 09, 1955 Today's Date: 02/27/2023  History of Present Illness  67-year-old female with history of asthma, OSA/OHS uses BiPAP at night, chronic hypoxic hypercapnic respiratory failure on supplemental baseline O2 of 5 L nasal cannula, allergic rhinitis, chronic right knee pain presented to the ED from SNF due to shortness of breath. Patient also noted to have presented from rehab facility due to worsening right knee pain after not receiving medications on day of admission with worsening shortness of breath and increased work of breathing.  Clinical Impression  Pt presents with admitting diagnosis above. Pt today was able to transfer to chair with +2 Min A via 2 person HHA. Pt reports PTA she was at SNF however before that admission her son was helping her with transfers at baseline. It appears the pt is fairly close to baseline currently and pt states that she would like to return home instead of returning to SNF. Recommend HHPT upon DC. PT will continue to follow.         If plan is discharge home, recommend the following: Two people to help with walking and/or transfers;Two people to help with bathing/dressing/bathroom;Assistance with cooking/housework;Assist for transportation;Help with stairs or ramp for entrance   Can travel by private vehicle        Equipment Recommendations None recommended by PT  Recommendations for Other Services       Functional Status Assessment Patient has had a recent decline in their functional status and demonstrates the ability to make significant improvements in function in a reasonable and predictable amount of time.     Precautions / Restrictions Precautions Precautions: Fall Restrictions Weight Bearing Restrictions: Yes RLE Weight Bearing: Weight bearing as tolerated Other Position/Activity Restrictions: Pt reports she had KI at home but does not have it here.       Mobility  Bed Mobility Overal bed mobility: Needs Assistance Bed Mobility: Supine to Sit     Supine to sit: +2 for physical assistance, Mod assist     General bed mobility comments: Assistance with BLE management. Appears to be close to baseline.    Transfers Overall transfer level: Needs assistance Equipment used: 2 person hand held assist Transfers: Sit to/from Stand, Bed to chair/wheelchair/BSC Sit to Stand: +2 physical assistance, Min assist   Step pivot transfers: +2 physical assistance, Min assist       General transfer comment: Reliant on momentum to stand. +2 Min A with 2 person HHA for all mobility. Pt states this is how her son does it at home.    Ambulation/Gait               General Gait Details: Pt reports very little ambulation at baseline  Stairs            Wheelchair Mobility     Tilt Bed    Modified Rankin (Stroke Patients Only)       Balance Overall balance assessment: Needs assistance Sitting-balance support: Bilateral upper extremity supported Sitting balance-Leahy Scale: Good Sitting balance - Comments: sitting at mid line, balance maintained   Standing balance support: Bilateral upper extremity supported, Reliant on assistive device for balance, During functional activity Standing balance-Leahy Scale: Poor Standing balance comment: Reliant on 2 person HHA                             Pertinent Vitals/Pain Pain Assessment Pain Assessment: 0-10 Pain Score: 8  Pain Location: R knee Pain Descriptors / Indicators: Aching, Discomfort, Guarding Pain Intervention(s): Monitored during session, Repositioned    Home Living Family/patient expects to be discharged to:: Skilled nursing facility Living Arrangements: Spouse/significant other                      Prior Function Prior Level of Function : Needs assist             Mobility Comments: Pt reports her son helps her in and out of WC ADLs  Comments: Mostly independent however reports son would help out     Extremity/Trunk Assessment   Upper Extremity Assessment Upper Extremity Assessment: Overall WFL for tasks assessed    Lower Extremity Assessment Lower Extremity Assessment: Generalized weakness;RLE deficits/detail RLE Deficits / Details: Pt has torn R MCL and meniscus per chart review.    Cervical / Trunk Assessment Cervical / Trunk Assessment: Other exceptions Cervical / Trunk Exceptions: body habitus  Communication   Communication Communication: No apparent difficulties Cueing Techniques: Verbal cues;Tactile cues  Cognition Arousal: Alert Behavior During Therapy: WFL for tasks assessed/performed Overall Cognitive Status: Within Functional Limits for tasks assessed                                 General Comments: Very pleasant and talkative        General Comments General comments (skin integrity, edema, etc.): VSS on 5L    Exercises     Assessment/Plan    PT Assessment Patient needs continued PT services  PT Problem List Decreased strength;Decreased range of motion;Decreased activity tolerance;Decreased mobility;Decreased balance;Decreased coordination;Obesity;Pain;Impaired sensation       PT Treatment Interventions DME instruction;Gait training;Functional mobility training;Therapeutic activities;Therapeutic exercise;Balance training;Neuromuscular re-education;Patient/family education    PT Goals (Current goals can be found in the Care Plan section)  Acute Rehab PT Goals Patient Stated Goal: to go home PT Goal Formulation: With patient Time For Goal Achievement: 03/13/23 Potential to Achieve Goals: Fair    Frequency Min 1X/week     Co-evaluation               AM-PAC PT "6 Clicks" Mobility  Outcome Measure Help needed turning from your back to your side while in a flat bed without using bedrails?: A Lot Help needed moving from lying on your back to sitting on the side  of a flat bed without using bedrails?: A Lot Help needed moving to and from a bed to a chair (including a wheelchair)?: A Lot Help needed standing up from a chair using your arms (e.g., wheelchair or bedside chair)?: A Lot Help needed to walk in hospital room?: Total Help needed climbing 3-5 steps with a railing? : Total 6 Click Score: 10    End of Session Equipment Utilized During Treatment: Oxygen Activity Tolerance: Patient tolerated treatment well Patient left: in chair;with call bell/phone within reach;with chair alarm set Nurse Communication: Mobility status PT Visit Diagnosis: Unsteadiness on feet (R26.81);Other abnormalities of gait and mobility (R26.89);Repeated falls (R29.6);Muscle weakness (generalized) (M62.81);Difficulty in walking, not elsewhere classified (R26.2);Pain Pain - Right/Left: Right Pain - part of body: Knee;Leg    Time: 6045-4098 PT Time Calculation (min) (ACUTE ONLY): 38 min   Charges:   PT Evaluation $PT Eval Moderate Complexity: 1 Mod PT Treatments $Therapeutic Activity: 23-37 mins PT General Charges $$ ACUTE PT VISIT: 1 Visit         Kasidy Gianino B, PT, DPT Acute Rehab  Services [phone]   [name] 02/27/2023, 4:51 PM

## 2023-02-27 NOTE — Progress Notes (Signed)
   02/27/23 0029  BiPAP/CPAP/SIPAP  $ Non-Invasive Ventilator  Non-Invasive Vent Subsequent  BiPAP/CPAP/SIPAP Pt Type Adult  BiPAP/CPAP/SIPAP SERVO  Mask Type Full face mask  Mask Size Medium  Respiratory Rate 16 breaths/min  IPAP 14 cmH20  EPAP 7 cmH2O  Pressure Support 7 cmH20  FiO2 (%) 60 %  Minute Ventilation 9.6  Leak 44  Peak Inspiratory Pressure (PIP) 13  Tidal Volume (Vt) 554  Patient Home Equipment No  Auto Titrate No  Press High Alarm 30 cmH2O  Press Low Alarm 5 cmH2O  CPAP/SIPAP surface wiped down Yes   Pt sleeping comfortably on bipap.  RT will continue to monitor

## 2023-02-27 NOTE — Progress Notes (Signed)
PROGRESS NOTE                                                                                                                                                                                                             Patient Demographics:    Janice Ryan, is a 67 y.o. female, DOB - [birth year], XBJ:[id number]  Outpatient Primary MD for the patient is [name], NP    LOS - 1  Admit date - 02/25/2023    Chief Complaint  Patient presents with   Shortness of Breath       Brief Narrative (HPI from H&P)   67 year old female with history of asthma, OSA/OHS uses BiPAP at night, chronic hypoxic hypercapnic respiratory failure on supplemental baseline O2 of 5 L nasal cannula, allergic rhinitis, chronic right knee pain presented to the ED from SNF due to shortness of breath. Patient also noted to have presented from rehab facility due to worsening right knee pain after not receiving medications on day of admission with worsening shortness of breath and increased work of breathing. Per admitting physician on EMS arrival patient in respiratory distress, given a breathing treatment and had to be placed on the BiPAP in the ED with improved work of bleeding and improvement of repeated VBG. Influenza A, B, SARS coronavirus 2, RSV was negative. CT angiogram chest negative for PE or infiltrate. Patient admitted, placed on BiPAP, IV steroids, nebulizer treatments.    Subjective:    [name] today has, No headache, No chest pain, No abdominal pain - No Nausea, No new weakness tingling or numbness, much improved SOB   Assessment  & Plan :    #1 Acute on chronic respiratory failure with hypoxia and hypercapnia - secondary to acute asthma exacerbation, OHS/OSA along with acute on chronic diastolic CHF EF 60% or above on last echocardiogram 1 year ago.  On 5 to 6 L nasal cannula oxygen at home at baseline.   - Patient  presenting with worsening shortness of breath, hypoxia and hypercapnia, increased work of breathing with increased O2 requirement from baseline home O2.  Had diffuse wheezing and elevated BNP at the time of admission along with evidence of hypercapnia and hypoxia.  She was started on Lasix, Solu-Medrol, azithromycin along with initially BiPAP and day and then nighttime, overall wheezing much improved, and has diuresed well,  shortness of breath is much improved.  Continue diuresis, start titrating down Solu-Medrol, encouraged to sit in chair use I-S and flutter valve, daytime try her baseline 6 L nasal cannula oxygen, nighttime BiPAP, arrange for nighttime BiPAP.  Most likely will be discharged back to SNF.  Limited help at home and patient is wheelchair-bound.  Also start PT OT, encouraged to sit in chair use I-S and flutter valve for pulmonary toiletry.    2.  Hypokalemia -Repleted.   3.  OSA/OHS -It is noted per admitting physician that patient stated he used CPAP once within the last week at the rehab facility.  Patient placed on BiPAP nightly here and feeling better, will try and arrange for home BiPAP with outpatient sleep study.     4.  Depression -Continue home regimen Elavil, BuSpar, Cymbalta, Remeron.   5.  Morbid obesity -BMI 63.40 kg/m -Lifestyle modification -Outpatient follow-up.      Condition - Fair  Family Communication  :  None present  Code Status :  Full  Consults  :  None  PUD Prophylaxis : PPI   Procedures  :      CTA -  1. No embolism noted to the segmental pulmonary artery level, as described above. 2. No lung mass, consolidation, pleural effusion or pneumothorax.   TTE 01/2022 -    1. Suboptimal echocardiographic images   2. Left ventricular ejection fraction, by estimation, is >75%. The left ventricle has hyperdynamic function. The left ventricle has no regional wall motion abnormalities. Left ventricular diastolic function could not be evaluated.    3. The aortic valve was not well visualized. Aortic valve regurgitation is not visualized. Mild aortic valve stenosis.       Disposition Plan  :    Status is: Inpatient   DVT Prophylaxis  :  Lovenox  SCDs Start: 02/25/23 2308    Lab Results  Component Value Date   PLT 285 02/26/2023    Diet :  Diet Order             Diet Heart Room service appropriate? Yes; Fluid consistency: Thin  Diet effective now                    Inpatient Medications  Scheduled Meds:  acetaZOLAMIDE  500 mg Oral BID   amitriptyline  25 mg Oral QHS   azithromycin  250 mg Oral Daily   budesonide (PULMICORT) nebulizer solution  0.5 mg Nebulization BID   busPIRone  10 mg Oral TID   dextromethorphan-guaiFENesin  2 tablet Oral BID   doxepin  50 mg Oral Daily   DULoxetine  120 mg Oral Daily   enoxaparin (LOVENOX) injection  90 mg Subcutaneous Q24H   fluticasone  2 spray Each Nare Daily   furosemide  40 mg Intravenous BID   ipratropium-albuterol  3 mL Nebulization Q6H   methylPREDNISolone (SOLU-MEDROL) injection  80 mg Intravenous Daily   mirtazapine  15 mg Oral QHS   montelukast  10 mg Oral QHS   pantoprazole  40 mg Oral Daily   Continuous Infusions: PRN Meds:.acetaminophen **OR** acetaminophen, ipratropium-albuterol, ondansetron **OR** ondansetron (ZOFRAN) IV    Objective:   Vitals:   02/27/23 0415 02/27/23 0756 02/27/23 0758 02/27/23 0810  BP: 133/66   101/77  Pulse: 65   85  Resp: 14   19  Temp: 97.8 F (36.6 C)   98.4 F (36.9 C)  TempSrc: Oral   Oral  SpO2: 91% 94% 94% 96%  Weight:  Height:        Wt Readings from Last 3 Encounters:  02/25/23 (!) 189.1 kg  02/08/23 (!) 193 kg  01/29/22 (!) 202.3 kg     Intake/Output Summary (Last 24 hours) at 02/27/2023 0949 Last data filed at 02/27/2023 0435 Gross per 24 hour  Intake --  Output 1900 ml  Net -1900 ml     Physical Exam  Awake Alert, No new F.N deficits, Normal affect [place].AT,PERRAL Supple Neck, No JVD,    Symmetrical Chest wall movement, Good air movement bilaterally, CTAB RRR,No Gallops,Rubs or new Murmurs,  +ve B.Sounds, Abd Soft, No tenderness,   No Cyanosis, Clubbing or edema      Data Review:    Recent Labs  Lab 02/25/23 1103 02/25/23 2331 02/26/23 0600  WBC 10.9* 8.9 10.4  HGB 10.4* 10.6* 10.0*  HCT 35.0* 35.0* 32.7*  PLT 312 321 285  MCV 96.7 96.2 94.5  MCH 28.7 29.1 28.9  MCHC 29.7* 30.3 30.6  RDW 12.5 12.3 12.4    Recent Labs  Lab 02/25/23 1103 02/25/23 1201 02/25/23 2331 02/26/23 0600 02/27/23 0751  NA 134*  --   --  136  --   K 3.4*  --   --  4.3  --   CL 92*  --   --  93*  --   CO2 34*  --   --  34*  --   ANIONGAP 8  --   --  9  --   GLUCOSE 114*  --   --  140*  --   BUN 20  --   --  19  --   CREATININE 1.22*  --  1.01* 0.98  --   AST  --   --   --  13*  --   ALT  --   --   --  11  --   ALKPHOS  --   --   --  63  --   BILITOT  --   --   --  0.6  --   ALBUMIN  --   --   --  3.6  --   CRP  --   --   --   --  1.2*  LATICACIDVEN  --  1.2  --   --   --   BNP  --   --   --   --  138.3*  MG  --   --   --  2.5*  --   CALCIUM 9.8  --   --  10.2  --       Recent Labs  Lab 02/25/23 1103 02/25/23 1201 02/26/23 0600 02/27/23 0751  CRP  --   --   --  1.2*  LATICACIDVEN  --  1.2  --   --   BNP  --   --   --  138.3*  MG  --   --  2.5*  --   CALCIUM 9.8  --  10.2  --     Radiology Reports DG Chest Port 1 View  Result Date: 02/27/2023 CLINICAL DATA:  Shortness of breath EXAM: PORTABLE CHEST 1 VIEW COMPARISON:  Chest CT from 2 days ago FINDINGS: Cardiomegaly with vascular congestion and vascular pedicle widening. There is no edema, consolidation, effusion, or pneumothorax. IMPRESSION: Cardiomegaly and vascular congestion. Electronically Signed   By: Tiburcio Pea M.D.   On: 02/27/2023 06:27   CT Angio Chest PE W and/or Wo Contrast  Result Date: 02/25/2023 CLINICAL DATA:  Pulmonary embolism (PE) suspected, high prob. Shortness of breath. Arm pain.  EXAM: CT ANGIOGRAPHY CHEST WITH CONTRAST TECHNIQUE: Multidetector CT imaging of the chest was performed using the standard protocol during bolus administration of intravenous contrast. Multiplanar CT image reconstructions and MIPs were obtained to evaluate the vascular anatomy. RADIATION DOSE REDUCTION: This exam was performed according to the departmental dose-optimization program which includes automated exposure control, adjustment of the mA and/or kV according to patient size and/or use of iterative reconstruction technique. CONTRAST:  OMNIPAQUE IOHEXOL 350 MG/ML SOLN COMPARISON:  CT angiography chest from 02/07/2023. FINDINGS: Cardiovascular: Evaluation of pulmonary embolism beyond the segmental branches is limited due to suboptimal contrast-enhancement, patient's respiratory motion and streak artifacts from contrast within the superior vena cava. There is no embolism to the segmental pulmonary artery level. Mild cardiomegaly. No pericardial effusion. Redemonstration of lipomatous hypertrophy of the interatrial septum. No aortic aneurysm. There is dilation of the main pulmonary trunk measuring up to 4.1 cm, which is nonspecific but can be seen with pulmonary artery hypertension. Mediastinum/Nodes: Visualized thyroid gland appears grossly unremarkable. Redemonstration of a 2.2 x 2.6 cm soft tissue attenuation structure in the anterior mediastinum, unchanged since the prior study dating back to July 28, 2018 and favored to represent a slightly complex/proteinaceous cyst. The esophagus is nondistended precluding optimal assessment. No axillary, mediastinal or hilar lymphadenopathy by size criteria. Lungs/Pleura: The central tracheo-bronchial tree is patent. There are patchy areas of linear, plate-like atelectasis and/or scarring throughout bilateral lungs. No mass or consolidation. No pleural effusion or pneumothorax. No suspicious lung nodules. Upper Abdomen: Visualized upper abdominal viscera within  normal limits. Musculoskeletal: The visualized soft tissues of the chest wall are grossly unremarkable. No suspicious osseous lesions. There are mild to moderate multilevel degenerative changes in the visualized spine. Review of the MIP images confirms the above findings. IMPRESSION: 1. No embolism noted to the segmental pulmonary artery level, as described above. 2. No lung mass, consolidation, pleural effusion or pneumothorax. 3. Multiple other nonacute observations, as described above. Electronically Signed   By: Jules Schick M.D.   On: 02/25/2023 17:06   DG Chest 2 View  Result Date: 02/25/2023 CLINICAL DATA:  Shortness of breath. EXAM: CHEST - 2 VIEW COMPARISON:  02/07/2023. FINDINGS: Low lung volume. Bilateral lung fields are clear. Bilateral costophrenic angles are clear. Stable cardio-mediastinal silhouette. There is mild convexity of the main pulmonary artery silhouette, nonspecific but can be seen with pulmonary artery hypertension. No acute osseous abnormalities. The soft tissues are within normal limits. IMPRESSION: *No active cardiopulmonary disease. Electronically Signed   By: Jules Schick M.D.   On: 02/25/2023 12:40      Signature  -   Susa Raring M.D on 02/27/2023 at 9:49 AM   -  To page go to [email or web address]

## 2023-02-27 NOTE — Progress Notes (Signed)
Physical Therapy Treatment Patient Details Name: Janice Ryan MRN: [id number] DOB: [birth year] Today's Date: 02/27/2023   History of Present Illness 67-year-old female with history of asthma, OSA/OHS uses BiPAP at night, chronic hypoxic hypercapnic respiratory failure on supplemental baseline O2 of 5 L nasal cannula, allergic rhinitis, chronic right knee pain presented to the ED from SNF due to shortness of breath. Patient also noted to have presented from rehab facility due to worsening right knee pain after not receiving medications on day of admission with worsening shortness of breath and increased work of breathing.    PT Comments  Pt received requesting to transfer back to bed via nursing staff. Pt with similar presentation to initial eval however requiring +2 Mod A from lower surface. No change in DC/DME recs at this time. PT will continue to follow.     If plan is discharge home, recommend the following: Two people to help with walking and/or transfers;Two people to help with bathing/dressing/bathroom;Assistance with cooking/housework;Assist for transportation;Help with stairs or ramp for entrance   Can travel by private vehicle        Equipment Recommendations  None recommended by PT    Recommendations for Other Services       Precautions / Restrictions Precautions Precautions: Fall Restrictions Weight Bearing Restrictions: Yes RLE Weight Bearing: Weight bearing as tolerated Other Position/Activity Restrictions: Pt reports she had KI at home but does not have it here.     Mobility  Bed Mobility Overal bed mobility: Needs Assistance Bed Mobility: Sit to Supine     Supine to sit: +2 for physical assistance, Mod assist Sit to supine: +2 for physical assistance, Mod assist   General bed mobility comments: Assistance with BLE management. Appears to be close to baseline.    Transfers Overall transfer level: Needs assistance Equipment used: 2 person hand held  assist Transfers: Sit to/from Stand, Bed to chair/wheelchair/BSC Sit to Stand: +2 physical assistance, Mod assist   Step pivot transfers: +2 physical assistance, Min assist       General transfer comment: Reliant on momentum to stand. +2 Min A with 2 person HHA for all mobility. Pt states this is how her son does it at home. +2 Mod A from lower surface.    Ambulation/Gait               General Gait Details: Pt reports very little ambulation at baseline   Stairs             Wheelchair Mobility     Tilt Bed    Modified Rankin (Stroke Patients Only)       Balance Overall balance assessment: Needs assistance Sitting-balance support: Bilateral upper extremity supported Sitting balance-Leahy Scale: Good Sitting balance - Comments: sitting at mid line, balance maintained   Standing balance support: Bilateral upper extremity supported, Reliant on assistive device for balance, During functional activity Standing balance-Leahy Scale: Poor Standing balance comment: Reliant on 2 person HHA                            Cognition Arousal: Alert Behavior During Therapy: WFL for tasks assessed/performed Overall Cognitive Status: Within Functional Limits for tasks assessed                                 General Comments: Very pleasant and talkative        Exercises  General Comments General comments (skin integrity, edema, etc.): VSS on 5L      Pertinent Vitals/Pain Pain Assessment Pain Assessment: 0-10 Pain Score: 5  Pain Location: R knee Pain Descriptors / Indicators: Aching, Discomfort, Guarding Pain Intervention(s): Monitored during session, Repositioned    Home Living Family/patient expects to be discharged to:: Skilled nursing facility Living Arrangements: Spouse/significant other                      Prior Function            PT Goals (current goals can now be found in the care plan section) Acute Rehab PT  Goals Patient Stated Goal: to go home PT Goal Formulation: With patient Time For Goal Achievement: 03/13/23 Potential to Achieve Goals: Fair Progress towards PT goals: Progressing toward goals    Frequency    Min 1X/week      PT Plan      Co-evaluation              AM-PAC PT "6 Clicks" Mobility   Outcome Measure  Help needed turning from your back to your side while in a flat bed without using bedrails?: A Lot Help needed moving from lying on your back to sitting on the side of a flat bed without using bedrails?: A Lot Help needed moving to and from a bed to a chair (including a wheelchair)?: A Lot Help needed standing up from a chair using your arms (e.g., wheelchair or bedside chair)?: A Lot Help needed to walk in hospital room?: Total Help needed climbing 3-5 steps with a railing? : Total 6 Click Score: 10    End of Session Equipment Utilized During Treatment: Oxygen Activity Tolerance: Patient tolerated treatment well Patient left: in chair;with call bell/phone within reach;with chair alarm set Nurse Communication: Mobility status PT Visit Diagnosis: Unsteadiness on feet (R26.81);Other abnormalities of gait and mobility (R26.89);Repeated falls (R29.6);Muscle weakness (generalized) (M62.81);Difficulty in walking, not elsewhere classified (R26.2);Pain Pain - Right/Left: Right Pain - part of body: Knee;Leg     Time: 1610-9604 PT Time Calculation (min) (ACUTE ONLY): 8 min  Charges:    $Therapeutic Activity: 8-22 mins PT General Charges $$ ACUTE PT VISIT: 1 Visit                     Shela Nevin, PT, DPT Acute Rehab Services [phone]    [name] 02/27/2023, 4:57 PM

## 2023-02-27 NOTE — Telephone Encounter (Signed)
Copied from CRM [id number]. Topic: General - Other >> Feb 27, 2023 11:57 AM Marlow Baars wrote: Reason for CRM: The patient wants her provider to know she is back in the hospital as she had an asthma attack. She was at [facility] and is now back at [facility]. She has recently been prescribed Metformin but would like to discuss other medications like Wegovy and Ozempic with her provider. Please assist patient further.

## 2023-02-28 DIAGNOSIS — J9622 Acute and chronic respiratory failure with hypercapnia: Secondary | ICD-10-CM | POA: Diagnosis not present

## 2023-02-28 DIAGNOSIS — J9621 Acute and chronic respiratory failure with hypoxia: Secondary | ICD-10-CM | POA: Diagnosis not present

## 2023-02-28 LAB — CBC WITH DIFFERENTIAL/PLATELET
Abs Immature Granulocytes: 0.11 10*3/uL — ABNORMAL HIGH (ref 0.00–0.07)
Basophils Absolute: 0 10*3/uL (ref 0.0–0.1)
Basophils Relative: 0 %
Eosinophils Absolute: 0 10*3/uL (ref 0.0–0.5)
Eosinophils Relative: 0 %
HCT: 33.8 % — ABNORMAL LOW (ref 36.0–46.0)
Hemoglobin: 10.2 g/dL — ABNORMAL LOW (ref 12.0–15.0)
Immature Granulocytes: 1 %
Lymphocytes Relative: 10 %
Lymphs Abs: 1.2 10*3/uL (ref 0.7–4.0)
MCH: 28.7 pg (ref 26.0–34.0)
MCHC: 30.2 g/dL (ref 30.0–36.0)
MCV: 95.2 fL (ref 80.0–100.0)
Monocytes Absolute: 1.1 10*3/uL — ABNORMAL HIGH (ref 0.1–1.0)
Monocytes Relative: 9 %
Neutro Abs: 9.7 10*3/uL — ABNORMAL HIGH (ref 1.7–7.7)
Neutrophils Relative %: 80 %
Platelets: 297 10*3/uL (ref 150–400)
RBC: 3.55 MIL/uL — ABNORMAL LOW (ref 3.87–5.11)
RDW: 12.4 % (ref 11.5–15.5)
WBC: 12.2 10*3/uL — ABNORMAL HIGH (ref 4.0–10.5)
nRBC: 0 % (ref 0.0–0.2)

## 2023-02-28 LAB — BASIC METABOLIC PANEL
Anion gap: 10 (ref 5–15)
BUN: 31 mg/dL — ABNORMAL HIGH (ref 8–23)
CO2: 31 mmol/L (ref 22–32)
Calcium: 10.2 mg/dL (ref 8.9–10.3)
Chloride: 95 mmol/L — ABNORMAL LOW (ref 98–111)
Creatinine, Ser: 1.33 mg/dL — ABNORMAL HIGH (ref 0.44–1.00)
GFR, Estimated: 44 mL/min — ABNORMAL LOW (ref >60)
Glucose, Bld: 138 mg/dL — ABNORMAL HIGH (ref 70–99)
Potassium: 3.5 mmol/L (ref 3.5–5.1)
Sodium: 136 mmol/L (ref 135–145)

## 2023-02-28 LAB — MAGNESIUM: Magnesium: 2.2 mg/dL (ref 1.7–2.4)

## 2023-02-28 LAB — BRAIN NATRIURETIC PEPTIDE: B Natriuretic Peptide: 63.4 pg/mL (ref 0.0–100.0)

## 2023-02-28 LAB — C-REACTIVE PROTEIN: CRP: 0.8 mg/dL (ref <1.0)

## 2023-02-28 LAB — PHOSPHORUS: Phosphorus: 3.7 mg/dL (ref 2.5–4.6)

## 2023-02-28 LAB — PROCALCITONIN: Procalcitonin: <0.1 ng/mL

## 2023-02-28 MED ORDER — POTASSIUM CHLORIDE CRYS ER 20 MEQ PO TBCR
40.0000 meq | EXTENDED_RELEASE_TABLET | Freq: Once | ORAL | Status: AC
  Administered 2023-02-28: 40 meq via ORAL
  Filled 2023-02-28: qty 2

## 2023-02-28 NOTE — TOC CM/SW Note (Signed)
Due to [employer] Acute on Chronic respiratory failure secondary to Obesity Hyperventilation Syndrome , non-invasive ventilation is needed to assist with normalizing carbon dioxide & oxygen levels and to reduce the risk of repeat, acute episodes of respiratory failure resulting in longer inpatient hospital stays with more acute levels of care, such as ICU and mechanical ventilation. This patient would also benefit from mouthpiece ventilation for prn daytime use. He has been hospitalized for Chronic hypercarbia respiratory failure and Obesity Hyperventilation Syndrome. BIPAP,BIPAP ST, AVAPS has been considered but has been ruled-out and insufficient. NIV therapy is needed Pt's PC02 was >82 during this hospital stay on 02/25/2023. Interruption or failure to provide NIV would quickly lead to exacerbation of the patient's condition, lead to more hospitalizations and likely harm the patient or possibly death. Continued use of the NIV is preferred. Patient is able to maintain airway and clear secretions.

## 2023-02-28 NOTE — Progress Notes (Signed)
Physical Therapy Treatment Patient Details Name: Janice Ryan MRN: [id number] DOB: 09/18/1955 Today's Date: 02/28/2023   History of Present Illness 67 year old female with history of asthma, OSA/OHS uses BiPAP at night, chronic hypoxic hypercapnic respiratory failure on supplemental baseline O2 of 5 L nasal cannula, allergic rhinitis, chronic right knee pain presented to the ED from SNF due to shortness of breath. Patient also noted to have presented from rehab facility due to worsening right knee pain after not receiving medications on day of admission with worsening shortness of breath and increased work of breathing.    PT Comments  Pt tolerated treatment well today. Pt with similar presentation to previous session. No change in DC/DME recs at this time. PT will continue to follow.    If plan is discharge home, recommend the following: Two people to help with walking and/or transfers;Two people to help with bathing/dressing/bathroom;Assistance with cooking/housework;Assist for transportation;Help with stairs or ramp for entrance   Can travel by private vehicle        Equipment Recommendations  None recommended by PT    Recommendations for Other Services       Precautions / Restrictions Precautions Precautions: Fall Restrictions Weight Bearing Restrictions: Yes RLE Weight Bearing: Weight bearing as tolerated     Mobility  Bed Mobility Overal bed mobility: Needs Assistance Bed Mobility: Supine to Sit     Supine to sit: +2 for physical assistance, Mod assist     General bed mobility comments: Assistance with BLE management. Appears to be close to baseline.    Transfers Overall transfer level: Needs assistance Equipment used: 2 person hand held assist Transfers: Sit to/from Stand, Bed to chair/wheelchair/BSC Sit to Stand: +2 physical assistance, Min assist   Step pivot transfers: +2 physical assistance, Min assist       General transfer comment: Reliant on  momentum to stand. +2 Min A with 2 person HHA for all mobility. Pt states this is how her son does it at home.    Ambulation/Gait               General Gait Details: Pt reports very little ambulation at baseline   Stairs             Wheelchair Mobility     Tilt Bed    Modified Rankin (Stroke Patients Only)       Balance Overall balance assessment: Needs assistance Sitting-balance support: Bilateral upper extremity supported Sitting balance-Leahy Scale: Good Sitting balance - Comments: sitting at mid line, balance maintained   Standing balance support: Bilateral upper extremity supported, Reliant on assistive device for balance, During functional activity Standing balance-Leahy Scale: Poor Standing balance comment: Reliant on 2 person HHA                            Cognition Arousal: Alert Behavior During Therapy: WFL for tasks assessed/performed Overall Cognitive Status: Within Functional Limits for tasks assessed                                 General Comments: Very pleasant and talkative        Exercises      General Comments General comments (skin integrity, edema, etc.): VSS      Pertinent Vitals/Pain Pain Assessment Pain Assessment: No/denies pain    Home Living  Prior Function            PT Goals (current goals can now be found in the care plan section) Progress towards PT goals: Progressing toward goals    Frequency    Min 1X/week      PT Plan      Co-evaluation              AM-PAC PT "6 Clicks" Mobility   Outcome Measure  Help needed turning from your back to your side while in a flat bed without using bedrails?: A Lot Help needed moving from lying on your back to sitting on the side of a flat bed without using bedrails?: A Lot Help needed moving to and from a bed to a chair (including a wheelchair)?: A Lot Help needed standing up from a chair using  your arms (e.g., wheelchair or bedside chair)?: A Lot Help needed to walk in hospital room?: Total Help needed climbing 3-5 steps with a railing? : Total 6 Click Score: 10    End of Session Equipment Utilized During Treatment: Oxygen Activity Tolerance: Patient tolerated treatment well Patient left: in chair;with call bell/phone within reach;with chair alarm set Nurse Communication: Mobility status PT Visit Diagnosis: Unsteadiness on feet (R26.81);Other abnormalities of gait and mobility (R26.89);Repeated falls (R29.6);Muscle weakness (generalized) (M62.81);Difficulty in walking, not elsewhere classified (R26.2);Pain Pain - Right/Left: Right Pain - part of body: Knee;Leg     Time: 1442-1450 PT Time Calculation (min) (ACUTE ONLY): 8 min  Charges:    $Therapeutic Activity: 8-22 mins PT General Charges $$ ACUTE PT VISIT: 1 Visit                     Shela Nevin, PT, DPT Acute Rehab Services [phone]    [name] 02/28/2023, 4:13 PM

## 2023-02-28 NOTE — Plan of Care (Signed)
  Problem: Clinical Measurements: Goal: Ability to maintain clinical measurements within normal limits will improve Outcome: Progressing   Problem: Nutrition: Goal: Adequate nutrition will be maintained Outcome: Progressing   Problem: Coping: Goal: Level of anxiety will decrease Outcome: Progressing   Problem: Safety: Goal: Ability to remain free from injury will improve Outcome: Progressing   

## 2023-02-28 NOTE — Telephone Encounter (Signed)
Noted  

## 2023-02-28 NOTE — Progress Notes (Signed)
PROGRESS NOTE                                                                                                                                                                                                             Patient Demographics:    Janice Ryan, is a 67 y.o. female, DOB - 1955/04/09, JWJ:[id number]  Outpatient Primary MD for the patient is [name], NP    LOS - 2  Admit date - 02/25/2023    Chief Complaint  Patient presents with   Shortness of Breath       Brief Narrative (HPI from H&P)    67 year old female with history of asthma, OSA/OHS uses BiPAP at night, chronic hypoxic hypercapnic respiratory failure on supplemental baseline O2 of 5 L nasal cannula, allergic rhinitis, chronic right knee pain presented to the ED from SNF due to shortness of breath. Patient also noted to have presented from rehab facility due to worsening right knee pain after not receiving medications on day of admission with worsening shortness of breath and increased work of breathing. Per admitting physician on EMS arrival patient in respiratory distress, given a breathing treatment and had to be placed on the BiPAP in the ED with improved work of bleeding and improvement of repeated VBG. Influenza A, B, SARS coronavirus 2, RSV was negative. CT angiogram chest negative for PE or infiltrate. Patient admitted, placed on BiPAP, IV steroids, nebulizer treatments.    Subjective:    Janice Ryan today has, No headache, No chest pain, No abdominal pain - No Nausea, No new weakness tingling or numbness, much improved SOB   Assessment  & Plan :    #1 Acute on chronic respiratory failure with hypoxia and hypercapnia - secondary to acute asthma exacerbation, OHS/OSA along with acute on chronic diastolic CHF EF 60% or above on last echocardiogram 1 year ago.  On 5 to 6 L nasal cannula oxygen at home at baseline. - Patient  presenting with worsening shortness of breath, hypoxia and hypercapnia, increased work of breathing with increased O2 requirement from baseline home O2.  Had diffuse wheezing and elevated BNP at the time of admission along with evidence of hypercapnia and hypoxia.  She was started on Lasix, Solu-Medrol, azithromycin along with initially BiPAP and day and then nighttime, overall wheezing much improved, and has diuresed well, shortness  of breath is much improved.  Continue diuresis, with current dose 40 mg IV twice daily as long her renal function and blood pressure stable  -Continue with IV Solu-Medrol, putting her dose . -encouraged to sit in chair use I-S and flutter valve, daytime try her baseline 6 L nasal cannula oxygen, nighttime BiPAP, arrange for nighttime BiPAP.  Most likely will be discharged back to SNF.  Limited help at home and patient is wheelchair-bound.  Also start PT OT, encouraged to sit in chair use I-S and flutter valve for pulmonary toiletry. -Patient will need BiPAP on discharge, will obtain ABG at 5 AM while on BiPAP as discussed with case manager to arrange for BiPAP versus trilogy at home   2.  Hypokalemia -Repleted.   3.  OSA/OHS -It is noted per admitting physician that patient stated he used CPAP once within the last week at the rehab facility.  Patient placed on BiPAP nightly here and feeling better, will try and arrange for home BiPAP upon discharge.     4.  Depression -Continue home regimen Elavil, BuSpar, Cymbalta, Remeron.   5.  Morbid obesity -BMI 63.40 kg/m -Lifestyle modification -Outpatient follow-up.      Condition - Fair  Family Communication  :  None present  Code Status :  Full  Consults  :  None  PUD Prophylaxis : PPI   Procedures  :      CTA -  1. No embolism noted to the segmental pulmonary artery level, as described above. 2. No lung mass, consolidation, pleural effusion or pneumothorax.   TTE 01/2022 -    1. Suboptimal  echocardiographic images   2. Left ventricular ejection fraction, by estimation, is >75%. The left ventricle has hyperdynamic function. The left ventricle has no regional wall motion abnormalities. Left ventricular diastolic function could not be evaluated.   3. The aortic valve was not well visualized. Aortic valve regurgitation is not visualized. Mild aortic valve stenosis.       Disposition Plan  :    Status is: Inpatient   DVT Prophylaxis  :  Lovenox  SCDs Start: 02/25/23 2308    Lab Results  Component Value Date   PLT 297 02/28/2023    Diet :  Diet Order             Diet Heart Room service appropriate? Yes; Fluid consistency: Thin  Diet effective now                    Inpatient Medications  Scheduled Meds:  acetaZOLAMIDE  500 mg Oral BID   amitriptyline  25 mg Oral QHS   azithromycin  250 mg Oral Daily   budesonide (PULMICORT) nebulizer solution  0.5 mg Nebulization BID   busPIRone  10 mg Oral TID   dextromethorphan-guaiFENesin  2 tablet Oral BID   doxepin  50 mg Oral Daily   DULoxetine  120 mg Oral Daily   enoxaparin (LOVENOX) injection  90 mg Subcutaneous Q24H   fluticasone  2 spray Each Nare Daily   furosemide  40 mg Intravenous BID   ipratropium-albuterol  3 mL Nebulization BID   methylPREDNISolone (SOLU-MEDROL) injection  80 mg Intravenous Daily   mirtazapine  15 mg Oral QHS   montelukast  10 mg Oral QHS   pantoprazole  40 mg Oral Daily   Continuous Infusions: PRN Meds:.acetaminophen **OR** acetaminophen, ipratropium-albuterol, ondansetron **OR** ondansetron (ZOFRAN) IV    Objective:   Vitals:   02/27/23 1659 02/27/23 2034 02/28/23 0007  02/28/23 0900  BP: (!) 141/80 133/72 (!) 108/46   Pulse:  75 76   Resp: 15 (!) 23 17   Temp: 97.8 F (36.6 C) 98.2 F (36.8 C) 98 F (36.7 C)   TempSrc: Oral Oral Oral Oral  SpO2:   95%   Weight:      Height:        Wt Readings from Last 3 Encounters:  02/25/23 (!) 189.1 kg  02/08/23 (!) 193 kg   01/29/22 (!) 202.3 kg     Intake/Output Summary (Last 24 hours) at 02/28/2023 1506 Last data filed at 02/27/2023 2106 Gross per 24 hour  Intake --  Output 1100 ml  Net -1100 ml     Physical Exam  Awake Alert, Oriented X 3, No new F.N deficits, Normal affect Symmetrical Chest wall movement, Good air movement bilaterally, CTAB RRR,No Gallops,Rubs or new Murmurs, No Parasternal Heave +ve B.Sounds, Abd Soft, No tenderness, No rebound - guarding or rigidity. No Cyanosis, Clubbing or edema, No new Rash or bruise     Data Review:    Recent Labs  Lab 02/25/23 1103 02/25/23 2331 02/26/23 0600 02/28/23 0448  WBC 10.9* 8.9 10.4 12.2*  HGB 10.4* 10.6* 10.0* 10.2*  HCT 35.0* 35.0* 32.7* 33.8*  PLT 312 321 285 297  MCV 96.7 96.2 94.5 95.2  MCH 28.7 29.1 28.9 28.7  MCHC 29.7* 30.3 30.6 30.2  RDW 12.5 12.3 12.4 12.4  LYMPHSABS  --   --   --  1.2  MONOABS  --   --   --  1.1*  EOSABS  --   --   --  0.0  BASOSABS  --   --   --  0.0    Recent Labs  Lab 02/25/23 1103 02/25/23 1201 02/25/23 2331 02/26/23 0600 02/27/23 0751 02/28/23 0448  NA 134*  --   --  136  --  136  K 3.4*  --   --  4.3  --  3.5  CL 92*  --   --  93*  --  95*  CO2 34*  --   --  34*  --  31  ANIONGAP 8  --   --  9  --  10  GLUCOSE 114*  --   --  140*  --  138*  BUN 20  --   --  19  --  31*  CREATININE 1.22*  --  1.01* 0.98  --  1.33*  AST  --   --   --  13*  --   --   ALT  --   --   --  11  --   --   ALKPHOS  --   --   --  63  --   --   BILITOT  --   --   --  0.6  --   --   ALBUMIN  --   --   --  3.6  --   --   CRP  --   --   --   --  1.2* 0.8  PROCALCITON  --   --   --   --  <0.10 <0.10  LATICACIDVEN  --  1.2  --   --   --   --   BNP  --   --   --   --  138.3* 63.4  MG  --   --   --  2.5*  --  2.2  CALCIUM 9.8  --   --  10.2  --  10.2      Recent Labs  Lab 02/25/23 1103 02/25/23 1201 02/26/23 0600 02/27/23 0751 02/28/23 0448  CRP  --   --   --  1.2* 0.8  PROCALCITON  --   --   --   <0.10 <0.10  LATICACIDVEN  --  1.2  --   --   --   BNP  --   --   --  138.3* 63.4  MG  --   --  2.5*  --  2.2  CALCIUM 9.8  --  10.2  --  10.2    Radiology Reports DG Chest Port 1 View  Result Date: 02/27/2023 CLINICAL DATA:  Shortness of breath EXAM: PORTABLE CHEST 1 VIEW COMPARISON:  Chest CT from 2 days ago FINDINGS: Cardiomegaly with vascular congestion and vascular pedicle widening. There is no edema, consolidation, effusion, or pneumothorax. IMPRESSION: Cardiomegaly and vascular congestion. Electronically Signed   By: Tiburcio Pea M.D.   On: 02/27/2023 06:27   CT Angio Chest PE W and/or Wo Contrast  Result Date: 02/25/2023 CLINICAL DATA:  Pulmonary embolism (PE) suspected, high prob. Shortness of breath. Arm pain. EXAM: CT ANGIOGRAPHY CHEST WITH CONTRAST TECHNIQUE: Multidetector CT imaging of the chest was performed using the standard protocol during bolus administration of intravenous contrast. Multiplanar CT image reconstructions and MIPs were obtained to evaluate the vascular anatomy. RADIATION DOSE REDUCTION: This exam was performed according to the departmental dose-optimization program which includes automated exposure control, adjustment of the mA and/or kV according to patient size and/or use of iterative reconstruction technique. CONTRAST:  OMNIPAQUE IOHEXOL 350 MG/ML SOLN COMPARISON:  CT angiography chest from 02/07/2023. FINDINGS: Cardiovascular: Evaluation of pulmonary embolism beyond the segmental branches is limited due to suboptimal contrast-enhancement, patient's respiratory motion and streak artifacts from contrast within the superior vena cava. There is no embolism to the segmental pulmonary artery level. Mild cardiomegaly. No pericardial effusion. Redemonstration of lipomatous hypertrophy of the interatrial septum. No aortic aneurysm. There is dilation of the main pulmonary trunk measuring up to 4.1 cm, which is nonspecific but can be seen with pulmonary artery  hypertension. Mediastinum/Nodes: Visualized thyroid gland appears grossly unremarkable. Redemonstration of a 2.2 x 2.6 cm soft tissue attenuation structure in the anterior mediastinum, unchanged since the prior study dating back to July 28, 2018 and favored to represent a slightly complex/proteinaceous cyst. The esophagus is nondistended precluding optimal assessment. No axillary, mediastinal or hilar lymphadenopathy by size criteria. Lungs/Pleura: The central tracheo-bronchial tree is patent. There are patchy areas of linear, plate-like atelectasis and/or scarring throughout bilateral lungs. No mass or consolidation. No pleural effusion or pneumothorax. No suspicious lung nodules. Upper Abdomen: Visualized upper abdominal viscera within normal limits. Musculoskeletal: The visualized soft tissues of the chest wall are grossly unremarkable. No suspicious osseous lesions. There are mild to moderate multilevel degenerative changes in the visualized spine. Review of the MIP images confirms the above findings. IMPRESSION: 1. No embolism noted to the segmental pulmonary artery level, as described above. 2. No lung mass, consolidation, pleural effusion or pneumothorax. 3. Multiple other nonacute observations, as described above. Electronically Signed   By: Jules Schick M.D.   On: 02/25/2023 17:06   DG Chest 2 View  Result Date: 02/25/2023 CLINICAL DATA:  Shortness of breath. EXAM: CHEST - 2 VIEW COMPARISON:  02/07/2023. FINDINGS: Low lung volume. Bilateral lung fields are clear. Bilateral costophrenic angles are clear. Stable cardio-mediastinal silhouette. There is mild convexity of the main pulmonary artery silhouette, nonspecific  but can be seen with pulmonary artery hypertension. No acute osseous abnormalities. The soft tissues are within normal limits. IMPRESSION: *No active cardiopulmonary disease. Electronically Signed   By: Jules Schick M.D.   On: 02/25/2023 12:40      Signature  -   Huey Bienenstock  M.D on 02/28/2023 at 3:06 PM   -  To page go to [email or web address]

## 2023-03-01 DIAGNOSIS — J9621 Acute and chronic respiratory failure with hypoxia: Secondary | ICD-10-CM | POA: Diagnosis not present

## 2023-03-01 DIAGNOSIS — J9622 Acute and chronic respiratory failure with hypercapnia: Secondary | ICD-10-CM | POA: Diagnosis not present

## 2023-03-01 LAB — CBC WITH DIFFERENTIAL/PLATELET
Abs Immature Granulocytes: 0.14 10*3/uL — ABNORMAL HIGH (ref 0.00–0.07)
Basophils Absolute: 0 10*3/uL (ref 0.0–0.1)
Basophils Relative: 0 %
Eosinophils Absolute: 0 10*3/uL (ref 0.0–0.5)
Eosinophils Relative: 0 %
HCT: 32.7 % — ABNORMAL LOW (ref 36.0–46.0)
Hemoglobin: 9.8 g/dL — ABNORMAL LOW (ref 12.0–15.0)
Immature Granulocytes: 1 %
Lymphocytes Relative: 12 %
Lymphs Abs: 1.6 10*3/uL (ref 0.7–4.0)
MCH: 28.5 pg (ref 26.0–34.0)
MCHC: 30 g/dL (ref 30.0–36.0)
MCV: 95.1 fL (ref 80.0–100.0)
Monocytes Absolute: 1.4 10*3/uL — ABNORMAL HIGH (ref 0.1–1.0)
Monocytes Relative: 11 %
Neutro Abs: 9.6 10*3/uL — ABNORMAL HIGH (ref 1.7–7.7)
Neutrophils Relative %: 76 %
Platelets: 274 10*3/uL (ref 150–400)
RBC: 3.44 MIL/uL — ABNORMAL LOW (ref 3.87–5.11)
RDW: 12.3 % (ref 11.5–15.5)
WBC: 12.7 10*3/uL — ABNORMAL HIGH (ref 4.0–10.5)
nRBC: 0 % (ref 0.0–0.2)

## 2023-03-01 LAB — BLOOD GAS, ARTERIAL
Acid-Base Excess: 10.1 mmol/L — ABNORMAL HIGH (ref 0.0–2.0)
Bicarbonate: 38.6 mmol/L — ABNORMAL HIGH (ref 20.0–28.0)
O2 Saturation: 100 %
Patient temperature: 36.6
pCO2 arterial: 69 mm[Hg] (ref 32–48)
pH, Arterial: 7.36 (ref 7.35–7.45)
pO2, Arterial: 153 mm[Hg] — ABNORMAL HIGH (ref 83–108)

## 2023-03-01 LAB — BASIC METABOLIC PANEL
Anion gap: 9 (ref 5–15)
BUN: 32 mg/dL — ABNORMAL HIGH (ref 8–23)
CO2: 30 mmol/L (ref 22–32)
Calcium: 9.8 mg/dL (ref 8.9–10.3)
Chloride: 93 mmol/L — ABNORMAL LOW (ref 98–111)
Creatinine, Ser: 1.19 mg/dL — ABNORMAL HIGH (ref 0.44–1.00)
GFR, Estimated: 51 mL/min — ABNORMAL LOW (ref >60)
Glucose, Bld: 133 mg/dL — ABNORMAL HIGH (ref 70–99)
Potassium: 3.7 mmol/L (ref 3.5–5.1)
Sodium: 132 mmol/L — ABNORMAL LOW (ref 135–145)

## 2023-03-01 LAB — PROCALCITONIN: Procalcitonin: <0.1 ng/mL

## 2023-03-01 LAB — C-REACTIVE PROTEIN: CRP: 0.8 mg/dL (ref <1.0)

## 2023-03-01 LAB — PHOSPHORUS: Phosphorus: 3.8 mg/dL (ref 2.5–4.6)

## 2023-03-01 LAB — MAGNESIUM: Magnesium: 2.3 mg/dL (ref 1.7–2.4)

## 2023-03-01 LAB — BRAIN NATRIURETIC PEPTIDE: B Natriuretic Peptide: 43.2 pg/mL (ref 0.0–100.0)

## 2023-03-01 NOTE — Progress Notes (Signed)
PROGRESS NOTE                                                                                                                                                                                                             Patient Demographics:    Janice Ryan, is a 67 y.o. female, DOB - October 05, 1955, EXB:[id number]  Outpatient Primary MD for the patient is [name], NP    LOS - 3  Admit date - 02/25/2023    Chief Complaint  Patient presents with   Shortness of Breath       Brief Narrative (HPI from H&P)    67 year old female with history of asthma, OSA/OHS uses BiPAP at night, chronic hypoxic hypercapnic respiratory failure on supplemental baseline O2 of 5 L nasal cannula, allergic rhinitis, chronic right knee pain presented to the ED from SNF due to shortness of breath. Patient also noted to have presented from rehab facility due to worsening right knee pain after not receiving medications on day of admission with worsening shortness of breath and increased work of breathing. Per admitting physician on EMS arrival patient in respiratory distress, given a breathing treatment and had to be placed on the BiPAP in the ED with improved work of bleeding and improvement of repeated VBG. Influenza A, B, SARS coronavirus 2, RSV was negative. CT angiogram chest negative for PE or infiltrate. Patient admitted, placed on BiPAP, IV steroids, nebulizer treatments.    Subjective:    Janice Ryan with no significant events overnight, she was able to tolerate BiPAP, ABG this morning showing CO2 of 69.    Assessment  & Plan :    #1 Acute on chronic respiratory failure with hypoxia and hypercapnia - secondary to acute asthma exacerbation, OHS/OSA along with acute on chronic diastolic CHF EF 60% or above on last echocardiogram 1 year ago.  On 5 to 6 L nasal cannula oxygen at home at baseline. - Patient presenting with worsening  shortness of breath, hypoxia and hypercapnia, increased work of breathing with increased O2 requirement from baseline home O2.  Had diffuse wheezing and elevated BNP at the time of admission along with evidence of hypercapnia and hypoxia.  She was started on Lasix, Solu-Medrol, azithromycin along with initially BiPAP and day and then nighttime, overall wheezing much improved, and has diuresed well, shortness of breath is  much improved. -Continue with IV Lasix 40 mg IV twice daily -Continue with IV Solu-Medrol 80 mg daily -Patient on BiPAP nightly, ABG this morning showing CO2 of 69, she will benefit from Trelegy as an outpatient, TOC is following. -She was encouraged with incentive spirometry and flutter valve  Hypokalemia -Repleted.   OSA/OHS -It is noted per admitting physician that patient stated he used CPAP once within the last week at the rehab facility.  Patient placed on BiPAP nightly here and feeling better, will try and arrange for home BiPAP upon discharge.    Depression -Continue home regimen Elavil, BuSpar, Cymbalta, Remeron.   Morbid obesity -BMI 63.40 kg/m -Lifestyle modification -Outpatient follow-up.      Condition - Fair  Family Communication  :  None present  Code Status :  Full  Consults  :  None  PUD Prophylaxis : PPI   Procedures  :      CTA -  1. No embolism noted to the segmental pulmonary artery level, as described above. 2. No lung mass, consolidation, pleural effusion or pneumothorax.   TTE 01/2022 -    1. Suboptimal echocardiographic images   2. Left ventricular ejection fraction, by estimation, is >75%. The left ventricle has hyperdynamic function. The left ventricle has no regional wall motion abnormalities. Left ventricular diastolic function could not be evaluated.   3. The aortic valve was not well visualized. Aortic valve regurgitation is not visualized. Mild aortic valve stenosis.       Disposition Plan  :    Status is:  Inpatient   DVT Prophylaxis  :  Lovenox  SCDs Start: 02/25/23 2308    Lab Results  Component Value Date   PLT 274 03/01/2023    Diet :  Diet Order             Diet Heart Room service appropriate? Yes; Fluid consistency: Thin  Diet effective now                    Inpatient Medications  Scheduled Meds:  acetaZOLAMIDE  500 mg Oral BID   amitriptyline  25 mg Oral QHS   azithromycin  250 mg Oral Daily   budesonide (PULMICORT) nebulizer solution  0.5 mg Nebulization BID   busPIRone  10 mg Oral TID   dextromethorphan-guaiFENesin  2 tablet Oral BID   doxepin  50 mg Oral Daily   DULoxetine  120 mg Oral Daily   enoxaparin (LOVENOX) injection  90 mg Subcutaneous Q24H   fluticasone  2 spray Each Nare Daily   ipratropium-albuterol  3 mL Nebulization BID   methylPREDNISolone (SOLU-MEDROL) injection  80 mg Intravenous Daily   mirtazapine  15 mg Oral QHS   montelukast  10 mg Oral QHS   pantoprazole  40 mg Oral Daily   Continuous Infusions: PRN Meds:.acetaminophen **OR** acetaminophen, ipratropium-albuterol, ondansetron **OR** ondansetron (ZOFRAN) IV    Objective:   Vitals:   03/01/23 0512 03/01/23 0800 03/01/23 0845 03/01/23 1200  BP:  121/65    Pulse: 60 63    Resp:  17  20  Temp:  97.8 F (36.6 C)    TempSrc:  Axillary    SpO2: 97% 96% 93%   Weight:      Height:        Wt Readings from Last 3 Encounters:  02/25/23 (!) 189.1 kg  02/08/23 (!) 193 kg  01/29/22 (!) 202.3 kg     Intake/Output Summary (Last 24 hours) at 03/01/2023 1225 Last data filed  at 03/01/2023 0016 Gross per 24 hour  Intake --  Output 650 ml  Net -650 ml     Physical Exam  Awake Alert, Oriented X 3, No new F.N deficits, Normal affect Symmetrical Chest wall movement,  scattered Rales RRR,No Gallops,Rubs or new Murmurs, No Parasternal Heave +ve B.Sounds, Abd Soft, No tenderness, No rebound - guarding or rigidity. No Cyanosis, Clubbing or edema, No new Rash or bruise     Data  Review:    Recent Labs  Lab 02/25/23 1103 02/25/23 2331 02/26/23 0600 02/28/23 0448 03/01/23 0328  WBC 10.9* 8.9 10.4 12.2* 12.7*  HGB 10.4* 10.6* 10.0* 10.2* 9.8*  HCT 35.0* 35.0* 32.7* 33.8* 32.7*  PLT 312 321 285 297 274  MCV 96.7 96.2 94.5 95.2 95.1  MCH 28.7 29.1 28.9 28.7 28.5  MCHC 29.7* 30.3 30.6 30.2 30.0  RDW 12.5 12.3 12.4 12.4 12.3  LYMPHSABS  --   --   --  1.2 1.6  MONOABS  --   --   --  1.1* 1.4*  EOSABS  --   --   --  0.0 0.0  BASOSABS  --   --   --  0.0 0.0    Recent Labs  Lab 02/25/23 1103 02/25/23 1201 02/25/23 2331 02/26/23 0600 02/27/23 0751 02/28/23 0448 03/01/23 0328  NA 134*  --   --  136  --  136 132*  K 3.4*  --   --  4.3  --  3.5 3.7  CL 92*  --   --  93*  --  95* 93*  CO2 34*  --   --  34*  --  31 30  ANIONGAP 8  --   --  9  --  10 9  GLUCOSE 114*  --   --  140*  --  138* 133*  BUN 20  --   --  19  --  31* 32*  CREATININE 1.22*  --  1.01* 0.98  --  1.33* 1.19*  AST  --   --   --  13*  --   --   --   ALT  --   --   --  11  --   --   --   ALKPHOS  --   --   --  63  --   --   --   BILITOT  --   --   --  0.6  --   --   --   ALBUMIN  --   --   --  3.6  --   --   --   CRP  --   --   --   --  1.2* 0.8 0.8  PROCALCITON  --   --   --   --  <0.10 <0.10 <0.10  LATICACIDVEN  --  1.2  --   --   --   --   --   BNP  --   --   --   --  138.3* 63.4 43.2  MG  --   --   --  2.5*  --  2.2 2.3  CALCIUM 9.8  --   --  10.2  --  10.2 9.8      Recent Labs  Lab 02/25/23 1103 02/25/23 1201 02/26/23 0600 02/27/23 0751 02/28/23 0448 03/01/23 0328  CRP  --   --   --  1.2* 0.8 0.8  PROCALCITON  --   --   --  <0.10 <  0.10 <0.10  LATICACIDVEN  --  1.2  --   --   --   --   BNP  --   --   --  138.3* 63.4 43.2  MG  --   --  2.5*  --  2.2 2.3  CALCIUM 9.8  --  10.2  --  10.2 9.8    Radiology Reports DG Chest Port 1 View  Result Date: 02/27/2023 CLINICAL DATA:  Shortness of breath EXAM: PORTABLE CHEST 1 VIEW COMPARISON:  Chest CT from 2 days ago  FINDINGS: Cardiomegaly with vascular congestion and vascular pedicle widening. There is no edema, consolidation, effusion, or pneumothorax. IMPRESSION: Cardiomegaly and vascular congestion. Electronically Signed   By: Tiburcio Pea M.D.   On: 02/27/2023 06:27   CT Angio Chest PE W and/or Wo Contrast  Result Date: 02/25/2023 CLINICAL DATA:  Pulmonary embolism (PE) suspected, high prob. Shortness of breath. Arm pain. EXAM: CT ANGIOGRAPHY CHEST WITH CONTRAST TECHNIQUE: Multidetector CT imaging of the chest was performed using the standard protocol during bolus administration of intravenous contrast. Multiplanar CT image reconstructions and MIPs were obtained to evaluate the vascular anatomy. RADIATION DOSE REDUCTION: This exam was performed according to the departmental dose-optimization program which includes automated exposure control, adjustment of the mA and/or kV according to patient size and/or use of iterative reconstruction technique. CONTRAST:  OMNIPAQUE IOHEXOL 350 MG/ML SOLN COMPARISON:  CT angiography chest from 02/07/2023. FINDINGS: Cardiovascular: Evaluation of pulmonary embolism beyond the segmental branches is limited due to suboptimal contrast-enhancement, patient's respiratory motion and streak artifacts from contrast within the superior vena cava. There is no embolism to the segmental pulmonary artery level. Mild cardiomegaly. No pericardial effusion. Redemonstration of lipomatous hypertrophy of the interatrial septum. No aortic aneurysm. There is dilation of the main pulmonary trunk measuring up to 4.1 cm, which is nonspecific but can be seen with pulmonary artery hypertension. Mediastinum/Nodes: Visualized thyroid gland appears grossly unremarkable. Redemonstration of a 2.2 x 2.6 cm soft tissue attenuation structure in the anterior mediastinum, unchanged since the prior study dating back to July 28, 2018 and favored to represent a slightly complex/proteinaceous cyst. The esophagus is  nondistended precluding optimal assessment. No axillary, mediastinal or hilar lymphadenopathy by size criteria. Lungs/Pleura: The central tracheo-bronchial tree is patent. There are patchy areas of linear, plate-like atelectasis and/or scarring throughout bilateral lungs. No mass or consolidation. No pleural effusion or pneumothorax. No suspicious lung nodules. Upper Abdomen: Visualized upper abdominal viscera within normal limits. Musculoskeletal: The visualized soft tissues of the chest wall are grossly unremarkable. No suspicious osseous lesions. There are mild to moderate multilevel degenerative changes in the visualized spine. Review of the MIP images confirms the above findings. IMPRESSION: 1. No embolism noted to the segmental pulmonary artery level, as described above. 2. No lung mass, consolidation, pleural effusion or pneumothorax. 3. Multiple other nonacute observations, as described above. Electronically Signed   By: Jules Schick M.D.   On: 02/25/2023 17:06      Signature  -   Huey Bienenstock M.D on 03/01/2023 at 12:25 PM   -  To page go to [email or web address]

## 2023-03-01 NOTE — Progress Notes (Signed)
TRH night cross cover note:   I was notified by RN of patient's update blood gas result (ABG) this AM while on BiPAP overnight: 7.36/69/153/38.6. O2 sat 100%. Patient currently sleeping. Most recent prior blood gas appears to have been a VBG on 02/25/2023 which showed 7.41/65.      Newton Pigg, DO Hospitalist

## 2023-03-01 NOTE — TOC Progression Note (Signed)
Transition of Care [facility]) - Progression Note    Patient Details  Name: Janice Ryan MRN: [id number] Date of Birth: 08/25/1955  Transition of Care [facility]) CM/SW Contact  Gordy Clement, RN Phone Number: 03/01/2023, 1:22 PM  Clinical Narrative:     RNCM notified [employer] that test was completed to qualify patient for BiPAP   Preparing for a DC tomorrow          Expected Discharge Plan and Services                                               Social Determinants of Health (SDOH) Interventions SDOH Screenings   Food Insecurity: No Food Insecurity (02/26/2023)  Housing: Low Risk  (02/26/2023)  Transportation Needs: Unmet Transportation Needs (02/26/2023)  Utilities: Not At Risk (02/26/2023)  Depression (PHQ2-9): Low Risk  (07/06/2021)  Social Connections: Unknown (10/10/2022)   Received from [facility]  Tobacco Use: Low Risk  (02/25/2023)    Readmission Risk Interventions    02/08/2023    4:56 PM  Readmission Risk Prevention Plan  Post Dischage Appt Complete  Medication Screening Complete  Transportation Screening Complete

## 2023-03-01 NOTE — Plan of Care (Signed)
Problem: Clinical Measurements: Goal: Ability to maintain clinical measurements within normal limits will improve Outcome: Progressing   Problem: Nutrition: Goal: Adequate nutrition will be maintained Outcome: Progressing   Problem: Coping: Goal: Level of anxiety will decrease Outcome: Progressing   Problem: Safety: Goal: Ability to remain free from injury will improve Outcome: Progressing

## 2023-03-01 NOTE — Progress Notes (Addendum)
Pt taken off BiPAP and placed on 5L [place] by RT. Pt tolerating well at this time, no increased WOB noted, spo2 96%,BiPAP in standby at bedside, RT will monitor as needed.

## 2023-03-01 NOTE — Plan of Care (Signed)
  Problem: Health Behavior/Discharge Planning: Goal: Ability to manage health-related needs will improve Outcome: Progressing   Problem: Clinical Measurements: Goal: Ability to maintain clinical measurements within normal limits will improve Outcome: Progressing Goal: Will remain free from infection Outcome: Progressing Goal: Respiratory complications will improve Outcome: Progressing   Problem: Pain Management: Goal: General experience of comfort will improve Outcome: Progressing   Problem: Safety: Goal: Ability to remain free from injury will improve Outcome: Progressing   Problem: Skin Integrity: Goal: Risk for impaired skin integrity will decrease Outcome: Progressing

## 2023-03-02 ENCOUNTER — Telehealth (HOSPITAL_COMMUNITY): Payer: Self-pay | Admitting: Pharmacy Technician

## 2023-03-02 ENCOUNTER — Other Ambulatory Visit (HOSPITAL_COMMUNITY): Payer: Self-pay

## 2023-03-02 DIAGNOSIS — J9621 Acute and chronic respiratory failure with hypoxia: Secondary | ICD-10-CM | POA: Diagnosis not present

## 2023-03-02 DIAGNOSIS — J9622 Acute and chronic respiratory failure with hypercapnia: Secondary | ICD-10-CM | POA: Diagnosis not present

## 2023-03-02 LAB — CBC WITH DIFFERENTIAL/PLATELET
Abs Immature Granulocytes: 0.14 10*3/uL — ABNORMAL HIGH (ref 0.00–0.07)
Basophils Absolute: 0 10*3/uL (ref 0.0–0.1)
Basophils Relative: 0 %
Eosinophils Absolute: 0 10*3/uL (ref 0.0–0.5)
Eosinophils Relative: 0 %
HCT: 34.1 % — ABNORMAL LOW (ref 36.0–46.0)
Hemoglobin: 10 g/dL — ABNORMAL LOW (ref 12.0–15.0)
Immature Granulocytes: 1 %
Lymphocytes Relative: 7 %
Lymphs Abs: 1 10*3/uL (ref 0.7–4.0)
MCH: 27.8 pg (ref 26.0–34.0)
MCHC: 29.3 g/dL — ABNORMAL LOW (ref 30.0–36.0)
MCV: 94.7 fL (ref 80.0–100.0)
Monocytes Absolute: 1.1 10*3/uL — ABNORMAL HIGH (ref 0.1–1.0)
Monocytes Relative: 8 %
Neutro Abs: 11.8 10*3/uL — ABNORMAL HIGH (ref 1.7–7.7)
Neutrophils Relative %: 84 %
Platelets: 281 10*3/uL (ref 150–400)
RBC: 3.6 MIL/uL — ABNORMAL LOW (ref 3.87–5.11)
RDW: 12.2 % (ref 11.5–15.5)
WBC: 14.1 10*3/uL — ABNORMAL HIGH (ref 4.0–10.5)
nRBC: 0 % (ref 0.0–0.2)

## 2023-03-02 LAB — BASIC METABOLIC PANEL
Anion gap: 7 (ref 5–15)
BUN: 32 mg/dL — ABNORMAL HIGH (ref 8–23)
CO2: 31 mmol/L (ref 22–32)
Calcium: 9.6 mg/dL (ref 8.9–10.3)
Chloride: 98 mmol/L (ref 98–111)
Creatinine, Ser: 1.1 mg/dL — ABNORMAL HIGH (ref 0.44–1.00)
GFR, Estimated: 56 mL/min — ABNORMAL LOW (ref >60)
Glucose, Bld: 126 mg/dL — ABNORMAL HIGH (ref 70–99)
Potassium: 3.5 mmol/L (ref 3.5–5.1)
Sodium: 136 mmol/L (ref 135–145)

## 2023-03-02 LAB — PHOSPHORUS: Phosphorus: 3.6 mg/dL (ref 2.5–4.6)

## 2023-03-02 LAB — MAGNESIUM: Magnesium: 2.3 mg/dL (ref 1.7–2.4)

## 2023-03-02 MED ORDER — HYDROCODONE-ACETAMINOPHEN 5-325 MG PO TABS
1.0000 | ORAL_TABLET | ORAL | Status: DC | PRN
  Administered 2023-03-02 – 2023-03-06 (×14): 1 via ORAL
  Filled 2023-03-02 (×14): qty 1

## 2023-03-02 NOTE — Progress Notes (Signed)
PROGRESS NOTE                                                                                                                                                                                                             Patient Demographics:    Janice Ryan, is a 67 y.o. female, DOB - 03-05-1956, ZOX:[id number]  Outpatient Primary MD for the patient is [name], NP    LOS - 4  Admit date - 02/25/2023    Chief Complaint  Patient presents with   Shortness of Breath       Brief Narrative (HPI from H&P)    67 year old female with history of asthma, OSA/OHS uses BiPAP at night, chronic hypoxic hypercapnic respiratory failure on supplemental baseline O2 of 5 L nasal cannula, allergic rhinitis, chronic right knee pain presented to the ED from SNF due to shortness of breath. Patient also noted to have presented from rehab facility due to worsening right knee pain after not receiving medications on day of admission with worsening shortness of breath and increased work of breathing. Per admitting physician on EMS arrival patient in respiratory distress, given a breathing treatment and had to be placed on the BiPAP in the ED with improved work of bleeding and improvement of repeated VBG. Influenza A, B, SARS coronavirus 2, RSV was negative. CT angiogram chest negative for PE or infiltrate. Patient admitted, placed on BiPAP, IV steroids, nebulizer treatments.    Subjective:    Janice Ryan with no significant events overnight, she has been compliant with BiPAP over last few nights    Assessment  & Plan :    #1 Acute on chronic respiratory failure with hypoxia and hypercapnia - secondary to acute asthma exacerbation, OHS/OSA along with acute on chronic diastolic CHF EF 60% or above on last echocardiogram 1 year ago.  On 5 to 6 L nasal cannula oxygen at home at baseline. - Patient presenting with worsening shortness  of breath, hypoxia and hypercapnia, increased work of breathing with increased O2 requirement from baseline home O2.  Had diffuse wheezing and elevated BNP at the time of admission along with evidence of hypercapnia and hypoxia.  She was started on Lasix, Solu-Medrol, azithromycin along with initially BiPAP and day and then nighttime, overall wheezing much improved, and has diuresed well, shortness of breath is much improved. -Continue  with IV Lasix 40 mg IV twice daily, will continue with current dose as blood pressure and renal function appears stable. -Continue with IV Solu-Medrol 80 mg daily, leukocytosis trending up most likely due to steroids, she is nontoxic-appearing. -She is with evidence of CO2 retention, case management trying to arrange for home BiPAP versus trilogy -She was encouraged with incentive spirometry and flutter valve  Hypokalemia -Repleted.   OSA/OHS -It is noted per admitting physician that patient stated he used CPAP once within the last week at the rehab facility.  Patient placed on BiPAP nightly here and feeling better, will need home BiPAP upon discharge.    Depression -Continue home regimen Elavil, BuSpar, Cymbalta, Remeron.   Morbid obesity -BMI 63.40 kg/m -Lifestyle modification -Outpatient follow-up.      Condition - Fair  Family Communication  :  None present  Code Status :  Full  Consults  :  None  PUD Prophylaxis : PPI   Procedures  :      CTA -  1. No embolism noted to the segmental pulmonary artery level, as described above. 2. No lung mass, consolidation, pleural effusion or pneumothorax.   TTE 01/2022 -    1. Suboptimal echocardiographic images   2. Left ventricular ejection fraction, by estimation, is >75%. The left ventricle has hyperdynamic function. The left ventricle has no regional wall motion abnormalities. Left ventricular diastolic function could not be evaluated.   3. The aortic valve was not well visualized. Aortic valve  regurgitation is not visualized. Mild aortic valve stenosis.       Disposition Plan  :    Status is: Inpatient   DVT Prophylaxis  :  Lovenox  SCDs Start: 02/25/23 2308    Lab Results  Component Value Date   PLT 281 03/02/2023    Diet :  Diet Order             Diet Heart Room service appropriate? Yes; Fluid consistency: Thin  Diet effective now                    Inpatient Medications  Scheduled Meds:  acetaZOLAMIDE  500 mg Oral BID   amitriptyline  25 mg Oral QHS   budesonide (PULMICORT) nebulizer solution  0.5 mg Nebulization BID   busPIRone  10 mg Oral TID   dextromethorphan-guaiFENesin  2 tablet Oral BID   doxepin  50 mg Oral Daily   DULoxetine  120 mg Oral Daily   enoxaparin (LOVENOX) injection  90 mg Subcutaneous Q24H   fluticasone  2 spray Each Nare Daily   ipratropium-albuterol  3 mL Nebulization BID   methylPREDNISolone (SOLU-MEDROL) injection  80 mg Intravenous Daily   mirtazapine  15 mg Oral QHS   montelukast  10 mg Oral QHS   pantoprazole  40 mg Oral Daily   Continuous Infusions: PRN Meds:.acetaminophen **OR** acetaminophen, HYDROcodone-acetaminophen, ipratropium-albuterol, ondansetron **OR** ondansetron (ZOFRAN) IV    Objective:   Vitals:   03/02/23 0030 03/02/23 0500 03/02/23 0800 03/02/23 1232  BP: 135/68 137/66 130/63 126/63  Pulse: 78 63 62 85  Resp: 18 14 15 20   Temp: 98.8 F (37.1 C) 98 F (36.7 C) 97.9 F (36.6 C) 98.4 F (36.9 C)  TempSrc: Oral Oral Oral Oral  SpO2: 96% 98% 98% 94%  Weight:      Height:        Wt Readings from Last 3 Encounters:  02/25/23 (!) 189.1 kg  02/08/23 (!) 193 kg  01/29/22 (!) 202.3 kg  Intake/Output Summary (Last 24 hours) at 03/02/2023 1344 Last data filed at 03/02/2023 1318 Gross per 24 hour  Intake --  Output 1500 ml  Net -1500 ml     Physical Exam  Awake Alert, Oriented X 3, No new F.N deficits, Normal affect Symmetrical Chest wall movement, Good air movement bilaterally,  CTAB RRR,No Gallops,Rubs or new Murmurs, No Parasternal Heave +ve B.Sounds, Abd Soft, No tenderness, No rebound - guarding or rigidity. No Cyanosis, Clubbing or edema, No new Rash or bruise      Data Review:    Recent Labs  Lab 02/25/23 2331 02/26/23 0600 02/28/23 0448 03/01/23 0328 03/02/23 0354  WBC 8.9 10.4 12.2* 12.7* 14.1*  HGB 10.6* 10.0* 10.2* 9.8* 10.0*  HCT 35.0* 32.7* 33.8* 32.7* 34.1*  PLT 321 285 297 274 281  MCV 96.2 94.5 95.2 95.1 94.7  MCH 29.1 28.9 28.7 28.5 27.8  MCHC 30.3 30.6 30.2 30.0 29.3*  RDW 12.3 12.4 12.4 12.3 12.2  LYMPHSABS  --   --  1.2 1.6 1.0  MONOABS  --   --  1.1* 1.4* 1.1*  EOSABS  --   --  0.0 0.0 0.0  BASOSABS  --   --  0.0 0.0 0.0    Recent Labs  Lab 02/25/23 1103 02/25/23 1201 02/25/23 2331 02/26/23 0600 02/27/23 0751 02/28/23 0448 03/01/23 0328 03/02/23 0354  NA 134*  --   --  136  --  136 132* 136  K 3.4*  --   --  4.3  --  3.5 3.7 3.5  CL 92*  --   --  93*  --  95* 93* 98  CO2 34*  --   --  34*  --  31 30 31   ANIONGAP 8  --   --  9  --  10 9 7   GLUCOSE 114*  --   --  140*  --  138* 133* 126*  BUN 20  --   --  19  --  31* 32* 32*  CREATININE 1.22*  --  1.01* 0.98  --  1.33* 1.19* 1.10*  AST  --   --   --  13*  --   --   --   --   ALT  --   --   --  11  --   --   --   --   ALKPHOS  --   --   --  63  --   --   --   --   BILITOT  --   --   --  0.6  --   --   --   --   ALBUMIN  --   --   --  3.6  --   --   --   --   CRP  --   --   --   --  1.2* 0.8 0.8  --   PROCALCITON  --   --   --   --  <0.10 <0.10 <0.10  --   LATICACIDVEN  --  1.2  --   --   --   --   --   --   BNP  --   --   --   --  138.3* 63.4 43.2  --   MG  --   --   --  2.5*  --  2.2 2.3 2.3  CALCIUM 9.8  --   --  10.2  --  10.2 9.8 9.6  Recent Labs  Lab 02/25/23 1103 02/25/23 1201 02/26/23 0600 02/27/23 0751 02/28/23 0448 03/01/23 0328 03/02/23 0354  CRP  --   --   --  1.2* 0.8 0.8  --   PROCALCITON  --   --   --  <0.10 <0.10 <0.10  --    LATICACIDVEN  --  1.2  --   --   --   --   --   BNP  --   --   --  138.3* 63.4 43.2  --   MG  --   --  2.5*  --  2.2 2.3 2.3  CALCIUM 9.8  --  10.2  --  10.2 9.8 9.6    Radiology Reports DG Chest Port 1 View  Result Date: 02/27/2023 CLINICAL DATA:  Shortness of breath EXAM: PORTABLE CHEST 1 VIEW COMPARISON:  Chest CT from 2 days ago FINDINGS: Cardiomegaly with vascular congestion and vascular pedicle widening. There is no edema, consolidation, effusion, or pneumothorax. IMPRESSION: Cardiomegaly and vascular congestion. Electronically Signed   By: Tiburcio Pea M.D.   On: 02/27/2023 06:27      Signature  -   Huey Bienenstock M.D on 03/02/2023 at 1:44 PM   -  To page go to [email or web address]

## 2023-03-02 NOTE — Plan of Care (Signed)
  Problem: Health Behavior/Discharge Planning: Goal: Ability to manage health-related needs will improve Outcome: Progressing   Problem: Clinical Measurements: Goal: Ability to maintain clinical measurements within normal limits will improve Outcome: Progressing Goal: Respiratory complications will improve Outcome: Progressing   Problem: Activity: Goal: Risk for activity intolerance will decrease Outcome: Progressing   Problem: Pain Management: Goal: General experience of comfort will improve Outcome: Progressing   Problem: Safety: Goal: Ability to remain free from injury will improve Outcome: Progressing   Problem: Skin Integrity: Goal: Risk for impaired skin integrity will decrease Outcome: Progressing

## 2023-03-02 NOTE — Progress Notes (Signed)
Late Entry   03/01/23 0512  BiPAP/CPAP/SIPAP  BiPAP/CPAP/SIPAP Pt Type Adult  BiPAP/CPAP/SIPAP SERVO  Mask Type Full face mask  Mask Size Medium  Respiratory Rate 14 breaths/min  Pressure Support 7 cmH20  PEEP 7 cmH20  FiO2 (%) 60 %  Minute Ventilation 9.9  Leak 39  Peak Inspiratory Pressure (PIP) 14  Tidal Volume (Vt) 814  Patient Home Equipment No  BiPAP/CPAP /SiPAP Vitals  Pulse Rate 60  SpO2 97 %  MEWS Score/Color  MEWS Score 1  MEWS Score Color [place]

## 2023-03-02 NOTE — TOC Progression Note (Cosign Needed)
Transition of Care [facility]) - Progression Note    Patient Details  Name: Janice Ryan MRN: [id number] Date of Birth: Oct 12, 1955  Transition of Care [facility]) CM/SW Contact  Gordy Clement, RN Phone Number: 03/02/2023, 4:06 PM  Clinical Narrative:    ABG's were drawn but there was no note that indicated the BIPAP was on at the time. RNCM/Charge and Dr E reached out to RT to confirm that the BIPAP was in fact on (There is a note that shows patients BiPaP settings at the time of draw but was told by DME company that wasn't what they needed.) Needed something on the actual result stating "on BiPaP " Provider reached out to RT and they stated they would correct note. RNCM notified DME company. DME liaison called and stated that  he will follow up in am to ensure patient has the BiPAP.   Jermaine @ [employer] [phone]         Expected Discharge Plan and Services                                               Social Determinants of Health (SDOH) Interventions SDOH Screenings   Food Insecurity: No Food Insecurity (02/26/2023)  Housing: Low Risk  (02/26/2023)  Transportation Needs: Unmet Transportation Needs (02/26/2023)  Utilities: Not At Risk (02/26/2023)  Depression (PHQ2-9): Low Risk  (07/06/2021)  Social Connections: Unknown (10/10/2022)   Received from [facility], [facility]  Tobacco Use: Low Risk  (02/25/2023)    Readmission Risk Interventions    02/08/2023    4:56 PM  Readmission Risk Prevention Plan  Post Dischage Appt Complete  Medication Screening Complete  Transportation Screening Complete

## 2023-03-02 NOTE — Plan of Care (Addendum)
Patient remains on MCH-5W at time of writing. Patient is AA+Ox4. Patient is requiring 5 L/min of supplemental O2 via [place] (baseline). Plan for BiPAP at HS. Patient is morbidly obese but endorses a > 100 lb intentional weight loss of the previous year. Notably, the patient had eaten multiple snacks, the remnants of which were noted at the bedside; however, the patient requested more snacks (peanut butter, crackers and juice) with her PM meds and, sans an order for calorie restriction, nursing is obligated to provide the patient with snacks as requested. Also, although the patient is (appropriately) scored as a highs falls risk, no fall prevention measures were in place at the time of change of shift. Fall risk bundle implemented by the RN, including yellow "falls risk" band and bed alarm. PRN medications for pain control (see MAR). The patient also declined 2000 hrs vital signs; plan to resume routine VS with MN VS checks.   Edit: 5784 hrs: This RN offered to assist patient OOB and into chair for breakfast at this time and the patient respectfully declined.   Problem: Education: Goal: Knowledge of General Education information will improve Description: Including pain rating scale, medication(s)/side effects and non-pharmacologic comfort measures Outcome: Progressing   Problem: Health Behavior/Discharge Planning: Goal: Ability to manage health-related needs will improve Outcome: Progressing   Problem: Clinical Measurements: Goal: Ability to maintain clinical measurements within normal limits will improve Outcome: Progressing Goal: Will remain free from infection Outcome: Progressing Goal: Diagnostic test results will improve Outcome: Progressing Goal: Respiratory complications will improve Outcome: Progressing Goal: Cardiovascular complication will be avoided Outcome: Progressing   Problem: Activity: Goal: Risk for activity intolerance will decrease Outcome: Progressing   Problem:  Nutrition: Goal: Adequate nutrition will be maintained Outcome: Progressing   Problem: Coping: Goal: Level of anxiety will decrease Outcome: Progressing   Problem: Elimination: Goal: Will not experience complications related to bowel motility Outcome: Progressing Goal: Will not experience complications related to urinary retention Outcome: Progressing   Problem: Pain Management: Goal: General experience of comfort will improve Outcome: Progressing   Problem: Safety: Goal: Ability to remain free from injury will improve Outcome: Progressing   Problem: Skin Integrity: Goal: Risk for impaired skin integrity will decrease Outcome: Progressing   Problem: Education: Goal: Knowledge of disease or condition will improve Outcome: Progressing Goal: Knowledge of the prescribed therapeutic regimen will improve Outcome: Progressing Goal: Individualized Educational Video(s) Outcome: Progressing   Problem: Activity: Goal: Ability to tolerate increased activity will improve Outcome: Progressing Goal: Will verbalize the importance of balancing activity with adequate rest periods Outcome: Progressing   Problem: Respiratory: Goal: Ability to maintain a clear airway will improve Outcome: Progressing Goal: Levels of oxygenation will improve Outcome: Progressing Goal: Ability to maintain adequate ventilation will improve Outcome: Progressing

## 2023-03-02 NOTE — Telephone Encounter (Signed)
Patient Product/process development scientist completed.    The patient is insured through [employer]. Patient has Medicare and is not eligible for a copay card, but may be able to apply for patient assistance, if available.    Ran test claim for Trelegy Ellipta 200-62.5-25 mcg and the current 30 day co-pay is $0.00.   This test claim was processed through [employer]- copay amounts may vary at other pharmacies due to pharmacy/plan contracts, or as the patient moves through the different stages of their insurance plan.     Roland Earl, CPHT Pharmacy Technician III Certified Patient Advocate [facility] Patient Advocate Team Direct Number: [phone]  Fax: [phone]

## 2023-03-02 NOTE — Progress Notes (Signed)
Physical Therapy Treatment Patient Details Name: Janice Ryan MRN: [id number] DOB: May 25, 1955 Today's Date: 03/02/2023   History of Present Illness 67 year old female with history of asthma, OSA/OHS uses BiPAP at night, chronic hypoxic hypercapnic respiratory failure on supplemental baseline O2 of 5 L nasal cannula, allergic rhinitis, chronic right knee pain presented to the ED from SNF due to shortness of breath. Patient also noted to have presented from rehab facility due to worsening right knee pain after not receiving medications on day of admission with worsening shortness of breath and increased work of breathing.    PT Comments  Pt  progressing steadily toward goals, but limited by the R knee meniscal tear/internal derangement in conjunction with her weight.  Emphasis on technique/safety with transitions, scooting, sit to stands and pivotal steps to the chair.    If plan is discharge home, recommend the following: Two people to help with walking and/or transfers;Two people to help with bathing/dressing/bathroom;Assistance with cooking/housework;Assist for transportation;Help with stairs or ramp for entrance   Can travel by private vehicle     No  Equipment Recommendations  None recommended by PT    Recommendations for Other Services       Precautions / Restrictions Precautions Precautions: Fall Precaution Comments: monitor O2 on chronic O2 at home Restrictions Weight Bearing Restrictions: Yes RLE Weight Bearing: Weight bearing as tolerated     Mobility  Bed Mobility Overal bed mobility: Needs Assistance Bed Mobility: Supine to Sit Rolling: Mod assist, +2 for safety/equipment   Supine to sit: Mod assist, +2 for safety/equipment     General bed mobility comments: stationary assist for pt to pull on to get to EOB    Transfers Overall transfer level: Needs assistance   Transfers: Sit to/from Stand, Bed to chair/wheelchair/BSC Sit to Stand: Mod assist, +2  safety/equipment   Step pivot transfers: +2 physical assistance, Min assist       General transfer comment: assisted to build momentum to come forward with some boost assist.  stability assist while pt step pivoted.  Pt a bit anxious as stepping continued.    Ambulation/Gait               General Gait Details: pivot steps only.  R knee hurting too much to ambulate.   Stairs             Wheelchair Mobility     Tilt Bed    Modified Rankin (Stroke Patients Only)       Balance Overall balance assessment: Needs assistance Sitting-balance support: Bilateral upper extremity supported, No upper extremity supported Sitting balance-Leahy Scale: Good Sitting balance - Comments: balance not actively challenged, but pt moved around with/without UE assist   Standing balance support: Bilateral upper extremity supported, Reliant on assistive device for balance, During functional activity Standing balance-Leahy Scale: Poor Standing balance comment: Reliant on 1 to 2 person HHA                            Cognition Arousal: Alert Behavior During Therapy: WFL for tasks assessed/performed Overall Cognitive Status: Within Functional Limits for tasks assessed                                 General Comments: Very pleasant and talkative        Exercises      General Comments General comments (skin integrity, edema, etc.): Pt, sister and  therapist discussed the best destination for therapy being SNF likely for intensity and length of stay.  Discussed likely criteria for a SNF and expectations.      Pertinent Vitals/Pain Pain Assessment Pain Assessment: 0-10 Pain Score: 7  Pain Location: R knee Pain Descriptors / Indicators: Aching, Discomfort, Guarding Pain Intervention(s): Monitored during session, Limited activity within patient's tolerance    Home Living                          Prior Function            PT Goals (current  goals can now be found in the care plan section) Acute Rehab PT Goals PT Goal Formulation: With patient Time For Goal Achievement: 03/13/23 Potential to Achieve Goals: Fair Progress towards PT goals: Progressing toward goals    Frequency    Min 1X/week      PT Plan      Co-evaluation              AM-PAC PT "6 Clicks" Mobility   Outcome Measure  Help needed turning from your back to your side while in a flat bed without using bedrails?: A Lot Help needed moving from lying on your back to sitting on the side of a flat bed without using bedrails?: A Lot Help needed moving to and from a bed to a chair (including a wheelchair)?: A Lot Help needed standing up from a chair using your arms (e.g., wheelchair or bedside chair)?: A Lot Help needed to walk in hospital room?: Total Help needed climbing 3-5 steps with a railing? : Total 6 Click Score: 10    End of Session Equipment Utilized During Treatment: Oxygen Activity Tolerance: Patient tolerated treatment well Patient left: in chair;with call bell/phone within reach;with chair alarm set Nurse Communication: Mobility status PT Visit Diagnosis: Unsteadiness on feet (R26.81);Other abnormalities of gait and mobility (R26.89);Difficulty in walking, not elsewhere classified (R26.2) Pain - Right/Left: Right Pain - part of body: Knee;Leg     Time: 8295-6213 PT Time Calculation (min) (ACUTE ONLY): 42 min  Charges:    $Therapeutic Exercise: 8-22 mins $Therapeutic Activity: 8-22 mins $Self Care/Home Management: 8-22 PT General Charges $$ ACUTE PT VISIT: 1 Visit                     03/02/2023  Jacinto Halim., PT Acute Rehabilitation Services [phone]  (office)   Eliseo Gum Isidore Margraf 03/02/2023, 6:27 PM

## 2023-03-03 DIAGNOSIS — J9622 Acute and chronic respiratory failure with hypercapnia: Secondary | ICD-10-CM | POA: Diagnosis not present

## 2023-03-03 DIAGNOSIS — J9621 Acute and chronic respiratory failure with hypoxia: Secondary | ICD-10-CM | POA: Diagnosis not present

## 2023-03-03 LAB — CBC WITH DIFFERENTIAL/PLATELET
Abs Immature Granulocytes: 0.19 10*3/uL — ABNORMAL HIGH (ref 0.00–0.07)
Basophils Absolute: 0 10*3/uL (ref 0.0–0.1)
Basophils Relative: 0 %
Eosinophils Absolute: 0 10*3/uL (ref 0.0–0.5)
Eosinophils Relative: 0 %
HCT: 32.8 % — ABNORMAL LOW (ref 36.0–46.0)
Hemoglobin: 9.8 g/dL — ABNORMAL LOW (ref 12.0–15.0)
Immature Granulocytes: 1 %
Lymphocytes Relative: 10 %
Lymphs Abs: 1.5 10*3/uL (ref 0.7–4.0)
MCH: 28.2 pg (ref 26.0–34.0)
MCHC: 29.9 g/dL — ABNORMAL LOW (ref 30.0–36.0)
MCV: 94.5 fL (ref 80.0–100.0)
Monocytes Absolute: 1.2 10*3/uL — ABNORMAL HIGH (ref 0.1–1.0)
Monocytes Relative: 8 %
Neutro Abs: 12.2 10*3/uL — ABNORMAL HIGH (ref 1.7–7.7)
Neutrophils Relative %: 81 %
Platelets: 244 10*3/uL (ref 150–400)
RBC: 3.47 MIL/uL — ABNORMAL LOW (ref 3.87–5.11)
RDW: 12.2 % (ref 11.5–15.5)
WBC: 15.1 10*3/uL — ABNORMAL HIGH (ref 4.0–10.5)
nRBC: 0 % (ref 0.0–0.2)

## 2023-03-03 LAB — BASIC METABOLIC PANEL
Anion gap: 8 (ref 5–15)
BUN: 34 mg/dL — ABNORMAL HIGH (ref 8–23)
CO2: 29 mmol/L (ref 22–32)
Calcium: 9.7 mg/dL (ref 8.9–10.3)
Chloride: 99 mmol/L (ref 98–111)
Creatinine, Ser: 1.1 mg/dL — ABNORMAL HIGH (ref 0.44–1.00)
GFR, Estimated: 56 mL/min — ABNORMAL LOW (ref >60)
Glucose, Bld: 124 mg/dL — ABNORMAL HIGH (ref 70–99)
Potassium: 3.7 mmol/L (ref 3.5–5.1)
Sodium: 136 mmol/L (ref 135–145)

## 2023-03-03 LAB — PHOSPHORUS: Phosphorus: 3 mg/dL (ref 2.5–4.6)

## 2023-03-03 LAB — MAGNESIUM: Magnesium: 2.2 mg/dL (ref 1.7–2.4)

## 2023-03-03 MED ORDER — MUSCLE RUB 10-15 % EX CREA
TOPICAL_CREAM | CUTANEOUS | Status: DC | PRN
  Administered 2023-03-06: 1 via TOPICAL
  Filled 2023-03-03: qty 85

## 2023-03-03 NOTE — Progress Notes (Signed)
   03/03/23 0044  BiPAP/CPAP/SIPAP  BiPAP/CPAP/SIPAP Pt Type Adult  BiPAP/CPAP/SIPAP SERVO  Mask Type Full face mask  Mask Size Medium  Set Rate 18 breaths/min  Respiratory Rate 28 breaths/min  IPAP 10 cmH20  EPAP 5 cmH2O  PEEP 5 cmH20  FiO2 (%) 40 %  Minute Ventilation 12.9  Peak Inspiratory Pressure (PIP) 11  Tidal Volume (Vt) 645  Patient Home Equipment No  Press High Alarm 25 cmH2O  Press Low Alarm 3 cmH2O

## 2023-03-03 NOTE — Progress Notes (Signed)
   03/03/23 2247  BiPAP/CPAP/SIPAP  BiPAP/CPAP/SIPAP Pt Type Adult  BiPAP/CPAP/SIPAP Resmed  Mask Type Full face mask  Mask Size Large  Respiratory Rate 17 breaths/min  IPAP 14 cmH20  EPAP 6 cmH2O  Flow Rate 8 lpm  Patient Home Equipment No  Auto Titrate No  BiPAP/CPAP /SiPAP Vitals  Pulse Rate 95  Resp 17  SpO2 95 %  Bilateral Breath Sounds Diminished  MEWS Score/Color  MEWS Score 0  MEWS Score Color Chilton Si

## 2023-03-03 NOTE — Plan of Care (Signed)

## 2023-03-03 NOTE — Progress Notes (Signed)
PROGRESS NOTE                                                                                                                                                                                                             Patient Demographics:    Janice Ryan, is a 67 y.o. female, DOB - [birth year], UJW:[id number]  Outpatient Primary MD for the patient is [name], NP    LOS - 5  Admit date - 02/25/2023    Chief Complaint  Patient presents with   Shortness of Breath       Brief Narrative (HPI from H&P)     67 year old female with history of asthma, OSA/OHS uses BiPAP at night, chronic hypoxic hypercapnic respiratory failure on supplemental baseline O2 of 5 L nasal cannula, allergic rhinitis, chronic right knee pain presented to the ED from SNF due to shortness of breath. Patient also noted to have presented from rehab facility due to worsening right knee pain after not receiving medications on day of admission with worsening shortness of breath and increased work of breathing. Per admitting physician on EMS arrival patient in respiratory distress, given a breathing treatment and had to be placed on the BiPAP in the ED with improved work of bleeding and improvement of repeated VBG. Influenza A, B, SARS coronavirus 2, RSV was negative. CT angiogram chest negative for PE or infiltrate. Patient admitted, placed on BiPAP, IV steroids, nebulizer treatments.    Subjective:    Janice Ryan no significant events overnight, she has been wearing BiPAP    Assessment  & Plan :    #1 Acute on chronic respiratory failure with hypoxia and hypercapnia - secondary to acute asthma exacerbation, OHS/OSA along with acute on chronic diastolic CHF EF 60% or above on last echocardiogram 1 year ago.  On 5 to 6 L nasal cannula oxygen at home at baseline. - Patient presenting with worsening shortness of breath, hypoxia and  hypercapnia, increased work of breathing with increased O2 requirement from baseline home O2.  Had diffuse wheezing and elevated BNP at the time of admission along with evidence of hypercapnia and hypoxia.  She was started on Lasix, Solu-Medrol, azithromycin along with initially BiPAP and day and then nighttime, overall wheezing much improved, and has diuresed well, shortness of breath is much improved. -Continue with IV Lasix 40 mg  IV twice daily, will continue with current dose as blood pressure and renal function appears stable. -I will DC her IV steroids.  She has no further wheezing. -She is with evidence of CO2 retention, case management trying to arrange for home BiPAP versus trilogy -She was encouraged with incentive spirometry and flutter valve  Hypokalemia -Repleted.   OSA/OHS -It is noted per admitting physician that patient stated he used CPAP once within the last week at the rehab facility.  Patient placed on BiPAP nightly here and feeling better, will need home BiPAP upon discharge.    Depression -Continue home regimen Elavil, BuSpar, Cymbalta, Remeron.   Morbid obesity -BMI 63.40 kg/m -Lifestyle modification -Outpatient follow-up.      Condition - Fair  Family Communication  :  None present  Code Status :  Full  Consults  :  None  PUD Prophylaxis : PPI   Procedures  :      CTA -  1. No embolism noted to the segmental pulmonary artery level, as described above. 2. No lung mass, consolidation, pleural effusion or pneumothorax.   TTE 01/2022 -    1. Suboptimal echocardiographic images   2. Left ventricular ejection fraction, by estimation, is >75%. The left ventricle has hyperdynamic function. The left ventricle has no regional wall motion abnormalities. Left ventricular diastolic function could not be evaluated.   3. The aortic valve was not well visualized. Aortic valve regurgitation is not visualized. Mild aortic valve stenosis.       Disposition Plan  :     Status is: Inpatient   DVT Prophylaxis  :  Lovenox  SCDs Start: 02/25/23 2308    Lab Results  Component Value Date   PLT 244 03/03/2023    Diet :  Diet Order             Diet Heart Room service appropriate? Yes; Fluid consistency: Thin  Diet effective now                    Inpatient Medications  Scheduled Meds:  acetaZOLAMIDE  500 mg Oral BID   amitriptyline  25 mg Oral QHS   budesonide (PULMICORT) nebulizer solution  0.5 mg Nebulization BID   busPIRone  10 mg Oral TID   dextromethorphan-guaiFENesin  2 tablet Oral BID   doxepin  50 mg Oral Daily   DULoxetine  120 mg Oral Daily   enoxaparin (LOVENOX) injection  90 mg Subcutaneous Q24H   fluticasone  2 spray Each Nare Daily   ipratropium-albuterol  3 mL Nebulization BID   methylPREDNISolone (SOLU-MEDROL) injection  80 mg Intravenous Daily   mirtazapine  15 mg Oral QHS   montelukast  10 mg Oral QHS   pantoprazole  40 mg Oral Daily   Continuous Infusions: PRN Meds:.acetaminophen **OR** acetaminophen, HYDROcodone-acetaminophen, ipratropium-albuterol, Muscle Rub, ondansetron **OR** ondansetron (ZOFRAN) IV    Objective:   Vitals:   03/03/23 0807 03/03/23 0809 03/03/23 0822 03/03/23 1122  BP:    (!) 146/70  Pulse:   65 87  Resp:   18 19  Temp:    98.2 F (36.8 C)  TempSrc:    Axillary  SpO2: 98% 98% 91% (!) 63%  Weight:      Height:        Wt Readings from Last 3 Encounters:  02/25/23 (!) 189.1 kg  02/08/23 (!) 193 kg  01/29/22 (!) 202.3 kg     Intake/Output Summary (Last 24 hours) at 03/03/2023 1448 Last data filed at  03/03/2023 0820 Gross per 24 hour  Intake --  Output 2100 ml  Net -2100 ml     Physical Exam  Awake Alert, Oriented X 3, No new F.N deficits, Normal affect Symmetrical Chest wall movement, Good air movement bilaterally, CTAB RRR,No Gallops,Rubs or new Murmurs, No Parasternal Heave +ve B.Sounds, Abd Soft, No tenderness, No rebound - guarding or rigidity. No Cyanosis,  Clubbing or edema, No new Rash or bruise      Data Review:    Recent Labs  Lab 02/26/23 0600 02/28/23 0448 03/01/23 0328 03/02/23 0354 03/03/23 0538  WBC 10.4 12.2* 12.7* 14.1* 15.1*  HGB 10.0* 10.2* 9.8* 10.0* 9.8*  HCT 32.7* 33.8* 32.7* 34.1* 32.8*  PLT 285 297 274 281 244  MCV 94.5 95.2 95.1 94.7 94.5  MCH 28.9 28.7 28.5 27.8 28.2  MCHC 30.6 30.2 30.0 29.3* 29.9*  RDW 12.4 12.4 12.3 12.2 12.2  LYMPHSABS  --  1.2 1.6 1.0 1.5  MONOABS  --  1.1* 1.4* 1.1* 1.2*  EOSABS  --  0.0 0.0 0.0 0.0  BASOSABS  --  0.0 0.0 0.0 0.0    Recent Labs  Lab 02/25/23 1201 02/25/23 2331 02/26/23 0600 02/27/23 0751 02/28/23 0448 03/01/23 0328 03/02/23 0354 03/03/23 0538  NA  --   --  136  --  136 132* 136 136  K  --   --  4.3  --  3.5 3.7 3.5 3.7  CL  --   --  93*  --  95* 93* 98 99  CO2  --   --  34*  --  31 30 31 29   ANIONGAP  --   --  9  --  10 9 7 8   GLUCOSE  --   --  140*  --  138* 133* 126* 124*  BUN  --   --  19  --  31* 32* 32* 34*  CREATININE  --    < > 0.98  --  1.33* 1.19* 1.10* 1.10*  AST  --   --  13*  --   --   --   --   --   ALT  --   --  11  --   --   --   --   --   ALKPHOS  --   --  63  --   --   --   --   --   BILITOT  --   --  0.6  --   --   --   --   --   ALBUMIN  --   --  3.6  --   --   --   --   --   CRP  --   --   --  1.2* 0.8 0.8  --   --   PROCALCITON  --   --   --  <0.10 <0.10 <0.10  --   --   LATICACIDVEN 1.2  --   --   --   --   --   --   --   BNP  --   --   --  138.3* 63.4 43.2  --   --   MG  --   --  2.5*  --  2.2 2.3 2.3 2.2  CALCIUM  --   --  10.2  --  10.2 9.8 9.6 9.7   < > = values in this interval not displayed.      Recent Labs  Lab 02/25/23  1201 02/26/23 0600 02/27/23 0751 02/28/23 0448 03/01/23 0328 03/02/23 0354 03/03/23 0538  CRP  --   --  1.2* 0.8 0.8  --   --   PROCALCITON  --   --  <0.10 <0.10 <0.10  --   --   LATICACIDVEN 1.2  --   --   --   --   --   --   BNP  --   --  138.3* 63.4 43.2  --   --   MG  --  2.5*  --  2.2 2.3  2.3 2.2  CALCIUM  --  10.2  --  10.2 9.8 9.6 9.7    Radiology Reports No results found.    Signature  -   Huey Bienenstock M.D on 03/03/2023 at 2:48 PM   -  To page go to [email or web address]

## 2023-03-04 DIAGNOSIS — J9621 Acute and chronic respiratory failure with hypoxia: Secondary | ICD-10-CM | POA: Diagnosis not present

## 2023-03-04 DIAGNOSIS — J9622 Acute and chronic respiratory failure with hypercapnia: Secondary | ICD-10-CM | POA: Diagnosis not present

## 2023-03-04 LAB — CBC WITH DIFFERENTIAL/PLATELET
Abs Immature Granulocytes: 0.32 10*3/uL — ABNORMAL HIGH (ref 0.00–0.07)
Basophils Absolute: 0 10*3/uL (ref 0.0–0.1)
Basophils Relative: 0 %
Eosinophils Absolute: 0 10*3/uL (ref 0.0–0.5)
Eosinophils Relative: 0 %
HCT: 33.3 % — ABNORMAL LOW (ref 36.0–46.0)
Hemoglobin: 10.2 g/dL — ABNORMAL LOW (ref 12.0–15.0)
Immature Granulocytes: 2 %
Lymphocytes Relative: 10 %
Lymphs Abs: 1.7 10*3/uL (ref 0.7–4.0)
MCH: 29.1 pg (ref 26.0–34.0)
MCHC: 30.6 g/dL (ref 30.0–36.0)
MCV: 95.1 fL (ref 80.0–100.0)
Monocytes Absolute: 1.5 10*3/uL — ABNORMAL HIGH (ref 0.1–1.0)
Monocytes Relative: 9 %
Neutro Abs: 13.5 10*3/uL — ABNORMAL HIGH (ref 1.7–7.7)
Neutrophils Relative %: 79 %
Platelets: 264 10*3/uL (ref 150–400)
RBC: 3.5 MIL/uL — ABNORMAL LOW (ref 3.87–5.11)
RDW: 12.2 % (ref 11.5–15.5)
WBC: 17.1 10*3/uL — ABNORMAL HIGH (ref 4.0–10.5)
nRBC: 0 % (ref 0.0–0.2)

## 2023-03-04 LAB — BASIC METABOLIC PANEL
Anion gap: 8 (ref 5–15)
BUN: 43 mg/dL — ABNORMAL HIGH (ref 8–23)
CO2: 29 mmol/L (ref 22–32)
Calcium: 10.2 mg/dL (ref 8.9–10.3)
Chloride: 99 mmol/L (ref 98–111)
Creatinine, Ser: 1.07 mg/dL — ABNORMAL HIGH (ref 0.44–1.00)
GFR, Estimated: 58 mL/min — ABNORMAL LOW (ref >60)
Glucose, Bld: 114 mg/dL — ABNORMAL HIGH (ref 70–99)
Potassium: 3.9 mmol/L (ref 3.5–5.1)
Sodium: 136 mmol/L (ref 135–145)

## 2023-03-04 MED ORDER — FUROSEMIDE 20 MG PO TABS
20.0000 mg | ORAL_TABLET | Freq: Every day | ORAL | Status: DC
  Administered 2023-03-04 – 2023-03-05 (×2): 20 mg via ORAL
  Filled 2023-03-04 (×2): qty 1

## 2023-03-04 NOTE — Progress Notes (Signed)
Orthopedic Tech Progress Note Patient Details:  Janice Ryan 05-26-1955 [id number]  Ortho Devices Type of Ortho Device: Knee Immobilizer Ortho Device/Splint Interventions: Ordered     Knee immobilizer dropped of at bedside for OOB/transfer use. Darleen Crocker 03/04/2023, 5:45 PM

## 2023-03-04 NOTE — Progress Notes (Signed)
   03/04/23 2333  BiPAP/CPAP/SIPAP  BiPAP/CPAP/SIPAP Pt Type Adult  BiPAP/CPAP/SIPAP Resmed  Mask Type Full face mask  Mask Size Large  Respiratory Rate 17 breaths/min  IPAP 14 cmH20  EPAP 6 cmH2O  FiO2 (%) 40 %  Flow Rate 5 lpm  Patient Home Equipment No  Auto Titrate No  BiPAP/CPAP /SiPAP Vitals  Pulse Rate 74  Resp 17  SpO2 93 %  Bilateral Breath Sounds Clear;Diminished  MEWS Score/Color  MEWS Score 0  MEWS Score Color Janice Ryan

## 2023-03-04 NOTE — Progress Notes (Signed)
PROGRESS NOTE                                                                                                                                                                                                             Patient Demographics:    Janice Ryan, is a 67 y.o. female, DOB - 1955/04/23, ZOX:[id number]  Outpatient Primary MD for the patient is [name], NP    LOS - 6  Admit date - 02/25/2023    Chief Complaint  Patient presents with   Shortness of Breath       Brief Narrative (HPI from H&P)     67 year old female with history of asthma, OSA/OHS uses BiPAP at night, chronic hypoxic hypercapnic respiratory failure on supplemental baseline O2 of 5 L nasal cannula, allergic rhinitis, chronic right knee pain presented to the ED from SNF due to shortness of breath. Patient also noted to have presented from rehab facility due to worsening right knee pain after not receiving medications on day of admission with worsening shortness of breath and increased work of breathing. Per admitting physician on EMS arrival patient in respiratory distress, given a breathing treatment and had to be placed on the BiPAP in the ED with improved work of bleeding and improvement of repeated VBG. Influenza A, B, SARS coronavirus 2, RSV was negative. CT angiogram chest negative for PE or infiltrate. Patient admitted, placed on BiPAP, IV steroids, nebulizer treatments.    Subjective:    Janice Ryan no significant events overnight, she has been wearing BiPAP    Assessment  & Plan :    #1 Acute on chronic respiratory failure with hypoxia and hypercapnia - secondary to acute asthma exacerbation, OHS/OSA along with acute on chronic diastolic CHF EF 60% or above on last echocardiogram 1 year ago.  On 5 to 6 L nasal cannula oxygen at home at baseline. - Patient presenting with worsening shortness of breath, hypoxia and  hypercapnia, increased work of breathing with increased O2 requirement from baseline home O2.  Had diffuse wheezing and elevated BNP at the time of admission along with evidence of hypercapnia and hypoxia. -Addition to p.o. Lasix 20 mg oral daily as volume status is appropriate. -IV Solu-Medrol, weaned off.  Has no further wheezing. -Will DC Diamox . -Patient with obesity hypoventilatory syndrome,  -She is with evidence of  CO2 retention, case management trying to arrange for home BiPAP versus trilogy -She was encouraged with incentive spirometry and flutter valve -Continue with BiPAP nightly  Hypokalemia -Repleted.   OSA/OHS -It is noted per admitting physician that patient stated he used CPAP once within the last week at the rehab facility.  Patient placed on BiPAP nightly here and feeling better, will need home BiPAP upon discharge.    Depression -Continue home regimen Elavil, BuSpar, Cymbalta, Remeron.   Morbid obesity -BMI 63.40 kg/m -Lifestyle modification -Outpatient follow-up.      Condition - Fair  Family Communication  :  None present  Code Status :  Full  Consults  :  None  PUD Prophylaxis : PPI   Procedures  :      CTA -  1. No embolism noted to the segmental pulmonary artery level, as described above. 2. No lung mass, consolidation, pleural effusion or pneumothorax.   TTE 01/2022 -    1. Suboptimal echocardiographic images   2. Left ventricular ejection fraction, by estimation, is >75%. The left ventricle has hyperdynamic function. The left ventricle has no regional wall motion abnormalities. Left ventricular diastolic function could not be evaluated.   3. The aortic valve was not well visualized. Aortic valve regurgitation is not visualized. Mild aortic valve stenosis.       Disposition Plan  :    Status is: Inpatient   DVT Prophylaxis  :  Lovenox  SCDs Start: 02/25/23 2308    Lab Results  Component Value Date   PLT 264 03/04/2023    Diet :   Diet Order             Diet Heart Room service appropriate? Yes; Fluid consistency: Thin  Diet effective now                    Inpatient Medications  Scheduled Meds:  amitriptyline  25 mg Oral QHS   budesonide (PULMICORT) nebulizer solution  0.5 mg Nebulization BID   busPIRone  10 mg Oral TID   dextromethorphan-guaiFENesin  2 tablet Oral BID   doxepin  50 mg Oral Daily   DULoxetine  120 mg Oral Daily   enoxaparin (LOVENOX) injection  90 mg Subcutaneous Q24H   fluticasone  2 spray Each Nare Daily   furosemide  20 mg Oral Daily   ipratropium-albuterol  3 mL Nebulization BID   mirtazapine  15 mg Oral QHS   montelukast  10 mg Oral QHS   pantoprazole  40 mg Oral Daily   Continuous Infusions: PRN Meds:.acetaminophen **OR** acetaminophen, HYDROcodone-acetaminophen, ipratropium-albuterol, Muscle Rub, ondansetron **OR** ondansetron (ZOFRAN) IV    Objective:   Vitals:   03/04/23 0800 03/04/23 0813 03/04/23 0814 03/04/23 1200  BP: 136/62   (!) 142/67  Pulse: 82 83  89  Resp: 16 16  16   Temp: 98 F (36.7 C)   97.8 F (36.6 C)  TempSrc: Oral   Oral  SpO2: 95% 96% 96% 92%  Weight:      Height:        Wt Readings from Last 3 Encounters:  02/25/23 (!) 189.1 kg  02/08/23 (!) 193 kg  01/29/22 (!) 202.3 kg     Intake/Output Summary (Last 24 hours) at 03/04/2023 1417 Last data filed at 03/03/2023 1925 Gross per 24 hour  Intake --  Output 1075 ml  Net -1075 ml     Physical Exam  Awake Alert, Oriented X 3, No new F.N deficits, Normal affect Symmetrical Chest  wall movement, Good air movement bilaterally, CTAB RRR,No Gallops,Rubs or new Murmurs, No Parasternal Heave +ve B.Sounds, Abd Soft, No tenderness, No rebound - guarding or rigidity. No Cyanosis, Clubbing or edema, No new Rash or bruise       Data Review:    Recent Labs  Lab 02/28/23 0448 03/01/23 0328 03/02/23 0354 03/03/23 0538 03/04/23 0629  WBC 12.2* 12.7* 14.1* 15.1* 17.1*  HGB 10.2* 9.8*  10.0* 9.8* 10.2*  HCT 33.8* 32.7* 34.1* 32.8* 33.3*  PLT 297 274 281 244 264  MCV 95.2 95.1 94.7 94.5 95.1  MCH 28.7 28.5 27.8 28.2 29.1  MCHC 30.2 30.0 29.3* 29.9* 30.6  RDW 12.4 12.3 12.2 12.2 12.2  LYMPHSABS 1.2 1.6 1.0 1.5 1.7  MONOABS 1.1* 1.4* 1.1* 1.2* 1.5*  EOSABS 0.0 0.0 0.0 0.0 0.0  BASOSABS 0.0 0.0 0.0 0.0 0.0    Recent Labs  Lab 02/26/23 0600 02/27/23 0751 02/28/23 0448 03/01/23 0328 03/02/23 0354 03/03/23 0538 03/04/23 0629  NA 136  --  136 132* 136 136 136  K 4.3  --  3.5 3.7 3.5 3.7 3.9  CL 93*  --  95* 93* 98 99 99  CO2 34*  --  31 30 31 29 29   ANIONGAP 9  --  10 9 7 8 8   GLUCOSE 140*  --  138* 133* 126* 124* 114*  BUN 19  --  31* 32* 32* 34* 43*  CREATININE 0.98  --  1.33* 1.19* 1.10* 1.10* 1.07*  AST 13*  --   --   --   --   --   --   ALT 11  --   --   --   --   --   --   ALKPHOS 63  --   --   --   --   --   --   BILITOT 0.6  --   --   --   --   --   --   ALBUMIN 3.6  --   --   --   --   --   --   CRP  --  1.2* 0.8 0.8  --   --   --   PROCALCITON  --  <0.10 <0.10 <0.10  --   --   --   BNP  --  138.3* 63.4 43.2  --   --   --   MG 2.5*  --  2.2 2.3 2.3 2.2  --   CALCIUM 10.2  --  10.2 9.8 9.6 9.7 10.2      Recent Labs  Lab 02/26/23 0600 02/27/23 0751 02/28/23 0448 03/01/23 0328 03/02/23 0354 03/03/23 0538 03/04/23 0629  CRP  --  1.2* 0.8 0.8  --   --   --   PROCALCITON  --  <0.10 <0.10 <0.10  --   --   --   BNP  --  138.3* 63.4 43.2  --   --   --   MG 2.5*  --  2.2 2.3 2.3 2.2  --   CALCIUM 10.2  --  10.2 9.8 9.6 9.7 10.2    Radiology Reports No results found.    Signature  -   Huey Bienenstock M.D on 03/04/2023 at 2:17 PM   -  To page go to [email or web address]

## 2023-03-04 NOTE — Plan of Care (Signed)
  Problem: Health Behavior/Discharge Planning: Goal: Ability to manage health-related needs will improve Outcome: Progressing   Problem: Clinical Measurements: Goal: Ability to maintain clinical measurements within normal limits will improve Outcome: Progressing Goal: Will remain free from infection Outcome: Progressing Goal: Respiratory complications will improve Outcome: Progressing   Problem: Pain Management: Goal: General experience of comfort will improve Outcome: Progressing   Problem: Skin Integrity: Goal: Risk for impaired skin integrity will decrease Outcome: Progressing

## 2023-03-04 NOTE — Progress Notes (Signed)
  ABG performed 03/01/2023 at 5:12 AM, with current vent setting on 5:12 AM.     03/01/23 0512  BiPAP/CPAP/SIPAP  BiPAP/CPAP/SIPAP Pt Type Adult  BiPAP/CPAP/SIPAP SERVO  Mask Type Full face mask  Mask Size Medium  Respiratory Rate 14 breaths/min  Pressure Support 7 cmH20  PEEP 7 cmH20  FiO2 (%) 60 %  Minute Ventilation 9.9  Leak 39  Peak Inspiratory Pressure (PIP) 14  Tidal Volume (Vt) 814  Patient Home Equipment No  BiPAP/CPAP /SiPAP Vitals  Pulse Rate 60  SpO2 97 %  MEWS Score/Color  MEWS Score 1  MEWS Score Color Green     Component     Latest Ref Rng 03/01/2023  pH, Arterial     7.35 - 7.45  7.36   pCO2 arterial     32 - 48 mmHg 69 (HH)   pO2, Arterial     83 - 108 mmHg 153 (H)   Bicarbonate     20.0 - 28.0 mmol/L 38.6 (H)   Acid-Base Excess     0.0 - 2.0 mmol/L 10.1 (H)   O2 Saturation     % 100   Patient temperature 36.6   Collection site RIGHT RADIAL   Drawn by Chilton [place]ouse, RRT   Allens test (pass/fail)     PASS  PASS     Legend: (HH) High Panic (H) High

## 2023-03-04 NOTE — Evaluation (Signed)
Occupational Therapy Evaluation Patient Details Name: Janice Ryan MRN: [id number] DOB: [birth year] Today's Date: 03/04/2023   History of Present Illness 67 year old female with history of asthma, OSA/OHS uses BiPAP at night, chronic hypoxic hypercapnic respiratory failure on supplemental baseline O2 of 5 L nasal cannula, allergic rhinitis, chronic right knee pain presented to the ED from SNF due to shortness of breath. Patient also noted to have presented from rehab facility due to worsening right knee pain after not receiving medications on day of admission with worsening shortness of breath and increased work of breathing.   Clinical Impression   Shabria was evaluated s/p the above admission list. She has been at SNF since recent fall with R knee injury, pt reports she has been mobilizing short distances with therapies and having assist for all ADLs at SNF. Upon evaluation the pt was limited by R knee pain, body habitus, generalized weakness, limited activity tolerance and no KI acutely. Of note, per last admission had MD orders to wear R KI when OOB for all transfers and mobility, RN and MD made aware with plan to order new R KI. Overall she needed mod A for bed mobility and deferred standing due to not having her brace. Pt reports she has been mobilizing short distances acutely with +2 assist. Due to the deficits listed below the pt also needs total A for toileting at bed level and set up A for UB ADLs. Anticipate good progress towards OOB ADLs acutely. Pt will benefit from continued acute OT services and skilled inpatient follow up therapy, <3 hours/day.        If plan is discharge home, recommend the following: A lot of help with walking and/or transfers;A lot of help with bathing/dressing/bathroom;Assistance with cooking/housework;Direct supervision/assist for medications management;Direct supervision/assist for financial management;Assist for transportation;Help with stairs or ramp for  entrance    Functional Status Assessment  Patient has had a recent decline in their functional status and demonstrates the ability to make significant improvements in function in a reasonable and predictable amount of time.  Equipment Recommendations  None recommended by OT       Precautions / Restrictions Precautions Precautions: Fall Precaution Comments: monitor O2 on chronic O2 at home Required Braces or Orthoses: Knee Immobilizer - Right Knee Immobilizer - Right: On when out of bed or walking Restrictions Weight Bearing Restrictions: Yes RLE Weight Bearing: Weight bearing as tolerated Other Position/Activity Restrictions: KI was left at SNF, asked Dr. Bea Laura to order new one 12/1.      Mobility Bed Mobility Overal bed mobility: Needs Assistance Bed Mobility: Supine to Sit, Sit to Sidelying, Sit to Supine     Supine to sit: Mod assist Sit to supine: Mod assist        Transfers                   General transfer comment: deferred, no KI acutely. Messaged Dr. Bea Laura for clarifying orders.      Balance Overall balance assessment: Needs assistance Sitting-balance support: Bilateral upper extremity supported, No upper extremity supported Sitting balance-Leahy Scale: Good     Standing balance support: Bilateral upper extremity supported, Reliant on assistive device for balance, During functional activity               ADL either performed or assessed with clinical judgement   ADL Overall ADL's : Needs assistance/impaired Eating/Feeding: Independent   Grooming: Set up;Sitting   Upper Body Bathing: Set up;Sitting   Lower Body Bathing: Maximal assistance;Bed level  Upper Body Dressing : Set up;Sitting   Lower Body Dressing: Maximal assistance;Bed level   Toilet Transfer: Total assistance Toilet Transfer Details (indicate cue type and reason): per pt, she has been getting OOB to chair this admission. no KI present with orders from last admission for KI to be  work any time pt is OOB. OOB deferred. Toileting- Clothing Manipulation and Hygiene: Total assistance;Bed level       Functional mobility during ADLs: Moderate assistance General ADL Comments: evaluation limited to bed level, per order from last admission, pt was to wear KI when OOB. no KI present acutely     Vision Baseline Vision/History: 0 No visual deficits Vision Assessment?: No apparent visual deficits     Perception Perception: Not tested       Praxis Praxis: Not tested       Pertinent Vitals/Pain Pain Assessment Pain Assessment: Faces Faces Pain Scale: Hurts a little bit Pain Location: R knee Pain Descriptors / Indicators: Aching, Discomfort, Guarding Pain Intervention(s): Limited activity within patient's tolerance, Monitored during session     Extremity/Trunk Assessment Upper Extremity Assessment Upper Extremity Assessment: Generalized weakness   Lower Extremity Assessment Lower Extremity Assessment: Defer to PT evaluation   Cervical / Trunk Assessment Cervical / Trunk Assessment: Other exceptions Cervical / Trunk Exceptions: body habitus   Communication Communication Communication: No apparent difficulties   Cognition Arousal: Alert Behavior During Therapy: WFL for tasks assessed/performed Overall Cognitive Status: Within Functional Limits for tasks assessed               General Comments: Very pleasant and talkative     General Comments  VSS, eager to participate     Home Living Family/patient expects to be discharged to:: Skilled nursing facility Living Arrangements: Spouse/significant other                               Additional Comments: patient reported having a rollator, wheelchair at home. patient reported having son and husband at home who help her some.      Prior Functioning/Environment Prior Level of Function : Needs assist             Mobility Comments: working with therapies on mobility at SNF, limited  to transfers and short distances ADLs Comments: staff assists with all ADLs, typically at bed level        OT Problem List: Decreased activity tolerance;Impaired balance (sitting and/or standing);Decreased coordination;Decreased safety awareness;Decreased knowledge of precautions;Decreased knowledge of use of DME or AE;Pain;Obesity      OT Treatment/Interventions: Self-care/ADL training;Therapeutic exercise;DME and/or AE instruction;Therapeutic activities;Patient/family education;Balance training    OT Goals(Current goals can be found in the care plan section) Acute Rehab OT Goals Patient Stated Goal: to get better OT Goal Formulation: With patient Time For Goal Achievement: 02/21/23 Potential to Achieve Goals: Fair ADL Goals Pt Will Perform Grooming: with contact guard assist;standing Pt Will Perform Lower Body Dressing: with min assist;sit to/from stand;with adaptive equipment Pt Will Transfer to Toilet: with min assist;ambulating Additional ADL Goal #1: Pt will indep manage wear schedule of R KI for all ADLs and functional mobility  OT Frequency: Min 1X/week       AM-PAC OT "6 Clicks" Daily Activity     Outcome Measure Help from another person eating meals?: None Help from another person taking care of personal grooming?: A Little Help from another person toileting, which includes using toliet, bedpan, or urinal?: Total Help from another  person bathing (including washing, rinsing, drying)?: A Lot Help from another person to put on and taking off regular upper body clothing?: A Little Help from another person to put on and taking off regular lower body clothing?: A Lot 6 Click Score: 15   End of Session Equipment Utilized During Treatment: Oxygen Nurse Communication: Mobility status  Activity Tolerance: Patient tolerated treatment well Patient left: in bed;with call bell/phone within reach  OT Visit Diagnosis: Unsteadiness on feet (R26.81);Other abnormalities of gait and  mobility (R26.89);Muscle weakness (generalized) (M62.81);Pain                Time: 1413-1430 OT Time Calculation (min): 17 min Charges:  OT General Charges $OT Visit: 1 Visit OT Evaluation $OT Eval Moderate Complexity: 1 Mod  Derenda Mis, OTR/L Acute Rehabilitation Services Office [phone] Secure Chat Communication Preferred   Donia Pounds 03/04/2023, 3:19 PM

## 2023-03-05 DIAGNOSIS — J9621 Acute and chronic respiratory failure with hypoxia: Secondary | ICD-10-CM | POA: Diagnosis not present

## 2023-03-05 DIAGNOSIS — J9622 Acute and chronic respiratory failure with hypercapnia: Secondary | ICD-10-CM | POA: Diagnosis not present

## 2023-03-05 LAB — BASIC METABOLIC PANEL
Anion gap: 5 (ref 5–15)
BUN: 45 mg/dL — ABNORMAL HIGH (ref 8–23)
CO2: 31 mmol/L (ref 22–32)
Calcium: 10.1 mg/dL (ref 8.9–10.3)
Chloride: 101 mmol/L (ref 98–111)
Creatinine, Ser: 1.14 mg/dL — ABNORMAL HIGH (ref 0.44–1.00)
GFR, Estimated: 53 mL/min — ABNORMAL LOW (ref >60)
Glucose, Bld: 110 mg/dL — ABNORMAL HIGH (ref 70–99)
Potassium: 3.9 mmol/L (ref 3.5–5.1)
Sodium: 137 mmol/L (ref 135–145)

## 2023-03-05 LAB — BLOOD GAS, ARTERIAL
Acid-Base Excess: 7.1 mmol/L — ABNORMAL HIGH (ref 0.0–2.0)
Bicarbonate: 34.2 mmol/L — ABNORMAL HIGH (ref 20.0–28.0)
Delivery systems: POSITIVE
Drawn by: 31101
Expiratory PAP: 6 cm[H2O]
FIO2: 40 %
Inspiratory PAP: 14 cm[H2O]
O2 Saturation: 98.2 %
Patient temperature: 36.5
pCO2 arterial: 61 mm[Hg] — ABNORMAL HIGH (ref 32–48)
pH, Arterial: 7.36 (ref 7.35–7.45)
pO2, Arterial: 77 mm[Hg] — ABNORMAL LOW (ref 83–108)

## 2023-03-05 NOTE — Care Management Important Message (Signed)
Important Message  Patient Details  Name: Janice Ryan MRN: [id number] Date of Birth: Apr 14, 1955   Important Message Given:  Yes - Medicare IM     Dorena Bodo 03/05/2023, 3:51 PM

## 2023-03-05 NOTE — TOC Progression Note (Signed)
Transition of Care [facility]) - Progression Note    Patient Details  Name: Janice Ryan MRN: [id number] Date of Birth: [birth year]  [facility] CM/SW Contact  Mearl Latin, LCSW Phone Number: 03/05/2023, 1:54 PM  Clinical Narrative:    Per MD, patient willing to consider SNF. CSW met with patient and made her aware that her only SNF bed offer is a sem-private room at [facility]. Patient declined this and stated she would go home with home health instead. CSW placed info for [facility] [employer] on AVS in the event she will need ltc placement from home. RNCM and MD updated.    Expected Discharge Plan: Home w Home Health Services    Expected Discharge Plan and Services                                               Social Determinants of Health (SDOH) Interventions SDOH Screenings   Food Insecurity: No Food Insecurity (02/26/2023)  Housing: Low Risk  (02/26/2023)  Transportation Needs: Unmet Transportation Needs (02/26/2023)  Utilities: Not At Risk (02/26/2023)  Depression (PHQ2-9): Low Risk  (07/06/2021)  Social Connections: Unknown (10/10/2022)   Received from [facility], [facility]  Tobacco Use: Low Risk  (02/25/2023)    Readmission Risk Interventions    02/08/2023    4:56 PM  Readmission Risk Prevention Plan  Post Dischage Appt Complete  Medication Screening Complete  Transportation Screening Complete

## 2023-03-05 NOTE — TOC Progression Note (Signed)
Transition of Care [facility]) - Progression Note    Patient Details  Name: Janice Ryan MRN: [id number] Date of Birth: [birth year]  Transition of Care [facility]) CM/SW Contact  Gordy Clement, RN Phone Number: 03/05/2023, 2:16 PM  Clinical Narrative:     Patient will DC to home now where she lives with her Daughter . Home Health SN(post hospitalization) PT and OT will be needed and provided by [facility] has provided patient with NIV to be delivered bedside today.  No additional DME needs .Patient states she wants to go home the same way we sent her home last time- via ambulance. Medical necessity form is completed and can be printed for DC envelope.    TOC will continue to follow patient for any additional discharge needs     Expected Discharge Plan: Home w Home Health Services    Expected Discharge Plan and Services                                               Social Determinants of Health (SDOH) Interventions SDOH Screenings   Food Insecurity: No Food Insecurity (02/26/2023)  Housing: Low Risk  (02/26/2023)  Transportation Needs: Unmet Transportation Needs (02/26/2023)  Utilities: Not At Risk (02/26/2023)  Depression (PHQ2-9): Low Risk  (07/06/2021)  Social Connections: Unknown (10/10/2022)   Received from [facility], [facility]  Tobacco Use: Low Risk  (02/25/2023)    Readmission Risk Interventions    02/08/2023    4:56 PM  Readmission Risk Prevention Plan  Post Dischage Appt Complete  Medication Screening Complete  Transportation Screening Complete

## 2023-03-05 NOTE — Plan of Care (Signed)

## 2023-03-05 NOTE — Progress Notes (Signed)
PROGRESS NOTE                                                                                                                                                                                                             Patient Demographics:    Janice Ryan, is a 67 y.o. female, DOB - Apr 17, 1955, IRS:[id number]  Outpatient Primary MD for the patient is [name], NP    LOS - 7  Admit date - 02/25/2023    Chief Complaint  Patient presents with   Shortness of Breath       Brief Narrative (HPI from H&P)     67 year old female with history of asthma, OSA/OHS uses BiPAP at night, chronic hypoxic hypercapnic respiratory failure on supplemental baseline O2 of 5 L nasal cannula, allergic rhinitis, chronic right knee pain presented to the ED from SNF due to shortness of breath. Patient also noted to have presented from rehab facility due to worsening right knee pain after not receiving medications on day of admission with worsening shortness of breath and increased work of breathing. Per admitting physician on EMS arrival patient in respiratory distress, given a breathing treatment and had to be placed on the BiPAP in the ED with improved work of bleeding and improvement of repeated VBG. Influenza A, B, SARS coronavirus 2, RSV was negative. CT angiogram chest negative for PE or infiltrate. Patient admitted, placed on BiPAP, IV steroids, nebulizer treatments.    Subjective:    Arlesia Zaccaria significant events overnight, she denies any complaints today, she has been tolerating BiPAP nightly   Assessment  & Plan :    #1 Acute on chronic respiratory failure with hypoxia and hypercapnia - secondary to acute asthma exacerbation, OHS/OSA along with acute on chronic diastolic CHF EF 60% or above on last echocardiogram 1 year ago.  On 5 to 6 L nasal cannula oxygen at home at baseline. - Patient presenting with worsening  shortness of breath, hypoxia and hypercapnia, increased work of breathing with increased O2 requirement from baseline home O2.  Had diffuse wheezing and elevated BNP at the time of admission along with evidence of hypercapnia and hypoxia. -Initially on IV Lasix, he is euvolemic currently, we will DC diuresis for now -Steroids initially, she has been weaned off -Will DC Diamox . -Patient with obesity hypoventilatory syndrome,  -She is  with evidence of CO2 retention, case management trying to arrange for home BiPAP versus trilogy -She was encouraged with incentive spirometry and flutter valve -Continue with BiPAP nightly  Hypokalemia -Repleted.   OSA/OHS -It is noted per admitting physician that patient stated he used CPAP once within the last week at the rehab facility.  Patient placed on BiPAP nightly here and feeling better, she will need BIPAP or trilogy on discharge.    Depression -Continue home regimen Elavil, BuSpar, Cymbalta, Remeron.   Morbid obesity -BMI 63.40 kg/m -Lifestyle modification -Outpatient follow-up.  Generalized weakness-deconditioning -For SNF placement, awaiting bed availability      Condition - Fair  Family Communication  :  None present  Code Status :  Full  Consults  :  None  PUD Prophylaxis : PPI   Procedures  :      CTA -  1. No embolism noted to the segmental pulmonary artery level, as described above. 2. No lung mass, consolidation, pleural effusion or pneumothorax.   TTE 01/2022 -    1. Suboptimal echocardiographic images   2. Left ventricular ejection fraction, by estimation, is >75%. The left ventricle has hyperdynamic function. The left ventricle has no regional wall motion abnormalities. Left ventricular diastolic function could not be evaluated.   3. The aortic valve was not well visualized. Aortic valve regurgitation is not visualized. Mild aortic valve stenosis.       Disposition Plan  :    Status is: Inpatient   DVT  Prophylaxis  :  Lovenox  SCDs Start: 02/25/23 2308    Lab Results  Component Value Date   PLT 264 03/04/2023    Diet :  Diet Order             Diet Heart Room service appropriate? Yes; Fluid consistency: Thin  Diet effective now                    Inpatient Medications  Scheduled Meds:  amitriptyline  25 mg Oral QHS   budesonide (PULMICORT) nebulizer solution  0.5 mg Nebulization BID   busPIRone  10 mg Oral TID   dextromethorphan-guaiFENesin  2 tablet Oral BID   doxepin  50 mg Oral Daily   DULoxetine  120 mg Oral Daily   enoxaparin (LOVENOX) injection  90 mg Subcutaneous Q24H   fluticasone  2 spray Each Nare Daily   ipratropium-albuterol  3 mL Nebulization BID   mirtazapine  15 mg Oral QHS   montelukast  10 mg Oral QHS   pantoprazole  40 mg Oral Daily   Continuous Infusions: PRN Meds:.acetaminophen **OR** acetaminophen, HYDROcodone-acetaminophen, ipratropium-albuterol, Muscle Rub, ondansetron **OR** ondansetron (ZOFRAN) IV    Objective:   Vitals:   03/04/23 2333 03/05/23 0507 03/05/23 0800 03/05/23 1207  BP:  (!) 120/57 (!) 129/58 135/85  Pulse: 74 77 73 100  Resp: 17 18 16    Temp:  99 F (37.2 C) 98.1 F (36.7 C) 97.9 F (36.6 C)  TempSrc:  Axillary Axillary Oral  SpO2: 93% 92% 92% 90%  Weight:      Height:        Wt Readings from Last 3 Encounters:  02/25/23 (!) 189.1 kg  02/08/23 (!) 193 kg  01/29/22 (!) 202.3 kg     Intake/Output Summary (Last 24 hours) at 03/05/2023 1246 Last data filed at 03/05/2023 0508 Gross per 24 hour  Intake --  Output 1300 ml  Net -1300 ml     Physical Exam  Awake Alert, Oriented  X 3, No new F.N deficits, Normal affect Symmetrical Chest wall movement, Good air movement bilaterally, CTAB RRR,No Gallops,Rubs or new Murmurs, No Parasternal Heave +ve B.Sounds, Abd Soft, No tenderness, No rebound - guarding or rigidity. No Cyanosis, Clubbing or edema, No new Rash or bruise        Data Review:    Recent  Labs  Lab 02/28/23 0448 03/01/23 0328 03/02/23 0354 03/03/23 0538 03/04/23 0629  WBC 12.2* 12.7* 14.1* 15.1* 17.1*  HGB 10.2* 9.8* 10.0* 9.8* 10.2*  HCT 33.8* 32.7* 34.1* 32.8* 33.3*  PLT 297 274 281 244 264  MCV 95.2 95.1 94.7 94.5 95.1  MCH 28.7 28.5 27.8 28.2 29.1  MCHC 30.2 30.0 29.3* 29.9* 30.6  RDW 12.4 12.3 12.2 12.2 12.2  LYMPHSABS 1.2 1.6 1.0 1.5 1.7  MONOABS 1.1* 1.4* 1.1* 1.2* 1.5*  EOSABS 0.0 0.0 0.0 0.0 0.0  BASOSABS 0.0 0.0 0.0 0.0 0.0    Recent Labs  Lab 02/27/23 0751 02/28/23 0448 02/28/23 0448 03/01/23 0328 03/02/23 0354 03/03/23 0538 03/04/23 0629 03/05/23 0440  NA  --  136   < > 132* 136 136 136 137  K  --  3.5   < > 3.7 3.5 3.7 3.9 3.9  CL  --  95*   < > 93* 98 99 99 101  CO2  --  31   < > 30 31 29 29 31   ANIONGAP  --  10   < > 9 7 8 8 5   GLUCOSE  --  138*   < > 133* 126* 124* 114* 110*  BUN  --  31*   < > 32* 32* 34* 43* 45*  CREATININE  --  1.33*   < > 1.19* 1.10* 1.10* 1.07* 1.14*  CRP 1.2* 0.8  --  0.8  --   --   --   --   PROCALCITON <0.10 <0.10  --  <0.10  --   --   --   --   BNP 138.3* 63.4  --  43.2  --   --   --   --   MG  --  2.2  --  2.3 2.3 2.2  --   --   CALCIUM  --  10.2   < > 9.8 9.6 9.7 10.2 10.1   < > = values in this interval not displayed.      Recent Labs  Lab 02/27/23 0751 02/28/23 0448 02/28/23 0448 03/01/23 0328 03/02/23 0354 03/03/23 0538 03/04/23 0629 03/05/23 0440  CRP 1.2* 0.8  --  0.8  --   --   --   --   PROCALCITON <0.10 <0.10  --  <0.10  --   --   --   --   BNP 138.3* 63.4  --  43.2  --   --   --   --   MG  --  2.2  --  2.3 2.3 2.2  --   --   CALCIUM  --  10.2   < > 9.8 9.6 9.7 10.2 10.1   < > = values in this interval not displayed.    Radiology Reports No results found.    Signature  -   Huey Bienenstock M.D on 03/05/2023 at 12:46 PM   -  To page go to [email or web address]

## 2023-03-05 NOTE — Progress Notes (Signed)
Physical Therapy Treatment Patient Details Name: Janice Ryan MRN: [id number] DOB: 27-Sep-1955 Today's Date: 03/05/2023   History of Present Illness 67 year old female with history of asthma, OSA/OHS uses BiPAP at night, chronic hypoxic hypercapnic respiratory failure on supplemental baseline O2 of 5 L nasal cannula, allergic rhinitis, chronic right knee pain presented to the ED from SNF due to shortness of breath. Patient also noted to have presented from rehab facility due to worsening right knee pain after not receiving medications on day of admission with worsening shortness of breath and increased work of breathing.    PT Comments  Pt received in recliner and requests to use the [facility]. Pt requires mod A to stand and min A to pivot to [facility] with +2 for safety due to weakness, impaired balance, and pain. Pt reports plan to return home and that her son lives with her and is able to assist. Pt requests time to use the bathroom and is left with call bell nearby. Pt continues to benefit from PT services to progress toward functional mobility goals.     If plan is discharge home, recommend the following: Two people to help with walking and/or transfers;Two people to help with bathing/dressing/bathroom;Assistance with cooking/housework;Assist for transportation;Help with stairs or ramp for entrance   Can travel by private vehicle     No  Equipment Recommendations  None recommended by PT    Recommendations for Other Services       Precautions / Restrictions Precautions Precautions: Fall Precaution Comments: monitor O2 on chronic O2 at home Required Braces or Orthoses: Knee Immobilizer - Right Knee Immobilizer - Right: On when out of bed or walking Restrictions RLE Weight Bearing: Weight bearing as tolerated     Mobility  Bed Mobility               General bed mobility comments: Pt in recliner upon entry    Transfers Overall transfer level: Needs assistance Equipment used:  Rolling walker (2 wheels) Transfers: Sit to/from Stand, Bed to chair/wheelchair/BSC Sit to Stand: Mod assist, +2 safety/equipment   Step pivot transfers: Min assist, +2 safety/equipment       General transfer comment: STS from recliner with mod A for power up and anterior weight shift. Min A for balance and RW management to pivot to [facility]       Balance Overall balance assessment: Needs assistance Sitting-balance support: Bilateral upper extremity supported, No upper extremity supported Sitting balance-Leahy Scale: Good     Standing balance support: Bilateral upper extremity supported, Reliant on assistive device for balance, During functional activity Standing balance-Leahy Scale: Poor Standing balance comment: with RW support                            Cognition Arousal: Alert Behavior During Therapy: WFL for tasks assessed/performed Overall Cognitive Status: Within Functional Limits for tasks assessed                                          Exercises      General Comments        Pertinent Vitals/Pain Pain Assessment Pain Assessment: Faces Faces Pain Scale: Hurts little more Pain Location: R knee Pain Descriptors / Indicators: Aching, Discomfort, Guarding Pain Intervention(s): Limited activity within patient's tolerance, Monitored during session     PT Goals (current goals can now be found in the  care plan section) Acute Rehab PT Goals Patient Stated Goal: to go home PT Goal Formulation: With patient Time For Goal Achievement: 03/13/23 Progress towards PT goals: Progressing toward goals    Frequency    Min 1X/week       AM-PAC PT "6 Clicks" Mobility   Outcome Measure  Help needed turning from your back to your side while in a flat bed without using bedrails?: A Lot Help needed moving from lying on your back to sitting on the side of a flat bed without using bedrails?: A Lot Help needed moving to and from a bed to a chair  (including a wheelchair)?: A Lot Help needed standing up from a chair using your arms (e.g., wheelchair or bedside chair)?: A Lot Help needed to walk in hospital room?: Total Help needed climbing 3-5 steps with a railing? : Total 6 Click Score: 10    End of Session Equipment Utilized During Treatment: Oxygen;Gait belt Activity Tolerance: Patient limited by pain Patient left: with call bell/phone within reach (on [facility]) Nurse Communication: Mobility status (Pt on BSC) PT Visit Diagnosis: Unsteadiness on feet (R26.81);Other abnormalities of gait and mobility (R26.89);Difficulty in walking, not elsewhere classified (R26.2)     Time: 7510-2585 PT Time Calculation (min) (ACUTE ONLY): 18 min  Charges:    $Therapeutic Activity: 8-22 mins PT General Charges $$ ACUTE PT VISIT: 1 Visit                     Johny Shock, PTA Acute Rehabilitation Services Secure Chat Preferred  [phone]    Johny Shock 03/05/2023, 2:43 PM

## 2023-03-05 NOTE — Telephone Encounter (Signed)
Thank you :)

## 2023-03-05 NOTE — Progress Notes (Signed)
Mobility Specialist Progress Note;   03/05/23 1150  Mobility  Activity Transferred to/from [facility]  Level of Assistance +2 (takes two people) (ModA)  Assistive Device Other (Comment) (HHA)  RLE Weight Bearing WBAT  Activity Response Tolerated well  Mobility Referral Yes  $Mobility charge 1 Mobility  Mobility Specialist Start Time (ACUTE ONLY) 1150  Mobility Specialist Stop Time (ACUTE ONLY) 1158  Mobility Specialist Time Calculation (min) (ACUTE ONLY) 8 min   Pt requesting assistance to [facility], agreeable to mobility. Required ModA+2 (w/ RN) via HHA to transfer pt from bed to [facility]. VSS throughout. Pt told us to give her ~37min; given call bell to call when finished.   Caesar Bookman Mobility Specialist Please contact via SecureChat or Rehab Office [phone]

## 2023-03-05 NOTE — Progress Notes (Deleted)
   03/05/23 2107  BiPAP/CPAP/SIPAP  BiPAP/CPAP/SIPAP Pt Type Adult  BiPAP/CPAP/SIPAP V60  Mask Type Full face mask  Mask Size Large  Set Rate 20 breaths/min  Respiratory Rate 23 breaths/min  IPAP 24 cmH20  EPAP 12 cmH2O  FiO2 (%) 50 %  Minute Ventilation 7.4  Leak 18  Peak Inspiratory Pressure (PIP) 23  Tidal Volume (Vt) 654  Patient Home Equipment No  Press High Alarm 35 cmH2O  Press Low Alarm 5 cmH2O

## 2023-03-05 NOTE — TOC CM/SW Note (Signed)
Due to [employer] Acute on Chronic respiratory failure secondary to Obesity Hyperventilation Syndrome , non-invasive ventilation is needed to assist with normalizing carbon dioxide & oxygen levels and to reduce the risk of repeat, acute episodes of respiratory failure resulting in longer inpatient hospital stays with more acute levels of care, such as ICU and mechanical ventilation. This patient would also benefit from mouthpiece ventilation for prn daytime use. She has been hospitalized for Chronic hypercarbia respiratory failure and Obesity Hyperventilation Syndrome. Patient placed on Bilevel BiPAP therapy with settings at 400 tidal volume, IPAP=14, EPAP=6, and rate of 6.  ABG on 03/05/23 after Bilevel BiPAP resulted in continued hypercapnia CO2 of 69>. Bilevel BiPAP therapy failed and patient requires E0466 NIV with battery back in the event of a power outage and advanced alarms to advise of a disconnect or low respiratory rate not supported by Bilevel BiPAP device.  Interruption or failure to provide NIV would quickly lead to exacerbation of the patient's condition, lead to more hospitalizations and likely harm the patient or possibly death. Continued use of the NIV is preferred. Patient is able to maintain airway and clear secretions.

## 2023-03-05 NOTE — Progress Notes (Signed)
Pt. ABG completed this morning as per order. Pt. Was on bipap 14/6 40%.

## 2023-03-05 NOTE — NC FL2 (Signed)
[place] MEDICAID FL2 LEVEL OF CARE FORM     IDENTIFICATION  Patient Name: Janice Ryan Birthdate: 05-01-1955 Sex: female Admission Date (Current Location): 02/25/2023  [facility] and [place] Number:  Producer, television/film/video and Address:  [facility] [place] [address] [place], [place] [address]      Provider Number: [id number]  Attending Physician Name and Address:  Elgergawy, Leana Roe, MD  Relative Name and Phone Number:       Current Level of Care: Hospital Recommended Level of Care: Skilled Nursing Facility Prior Approval Number:    Date Approved/Denied:   PASRR Number: [id number]  Discharge Plan: SNF    Current Diagnoses: Patient Active Problem List   Diagnosis Date Noted   Hypokalemia 02/25/2023   Morbid obesity with BMI of 60.0-69.9, adult (HCC) 02/25/2023   OSA (obstructive sleep apnea) 02/07/2023   Obesity hypoventilation syndrome (HCC) 02/07/2023   Asthma, chronic, unspecified asthma severity, with acute exacerbation 02/07/2023   Suspected novel influenza A virus infection 02/01/2022   AKI (acute kidney injury) (HCC) 01/25/2022   Hypercalcemia 01/24/2022   Pneumonia due to infectious organism 01/23/2022   Acute on chronic respiratory failure with hypoxia and hypercapnia (HCC) 01/21/2022   Fall at home, initial encounter 01/21/2022   Poor social situation 01/21/2022   Depression 03/31/2019   Major depressive disorder, recurrent episode, 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   Chronic respiratory failure with hypoxia (HCC) 12/10/2018   HTN (hypertension) 12/10/2018   Chronic pain of right knee    Moderate persistent asthma 07/28/2018    Orientation RESPIRATION BLADDER Height & Weight     Self, Time, Situation, Place  O2, Other (Comment) (3L nasal cannula, Bipap) Incontinent, External catheter Weight: (!) 417 lb (189.1 kg) Height:  5\' 8"  (172.7 cm)  BEHAVIORAL SYMPTOMS/MOOD NEUROLOGICAL BOWEL NUTRITION STATUS      Continent Diet (See dc  summary)  AMBULATORY STATUS COMMUNICATION OF NEEDS Skin   Extensive Assist Verbally Normal                       Personal Care Assistance Level of Assistance  Bathing, Feeding, Dressing Bathing Assistance: Maximum assistance Feeding assistance: Limited assistance Dressing Assistance: Maximum assistance     Functional Limitations Info  Sight Sight Info: Impaired        SPECIAL CARE FACTORS FREQUENCY  PT (By licensed PT), OT (By licensed OT)     PT Frequency: 5x/week OT Frequency: 5x/week            Contractures Contractures Info: Not present    Additional Factors Info  Code Status, Allergies Code Status Info: Full Allergies Info: Neurontin (Gabapentin) Sulfa Antibiotics           Current Medications (03/05/2023):  This is the current hospital active medication list Current Facility-Administered Medications  Medication Dose Route Frequency Provider Last Rate Last Admin   acetaminophen (TYLENOL) tablet 650 mg  650 mg Oral Q6H PRN Adefeso, Oladapo, DO   650 mg at 03/03/23 1204   Or   acetaminophen (TYLENOL) suppository 650 mg  650 mg Rectal Q6H PRN Adefeso, Oladapo, DO       amitriptyline (ELAVIL) tablet 25 mg  25 mg Oral QHS Adefeso, Oladapo, DO   25 mg at 03/04/23 2129   budesonide (PULMICORT) nebulizer solution 0.5 mg  0.5 mg Nebulization BID Rodolph Bong, MD   0.5 mg at 03/05/23 0844   busPIRone (BUSPAR) tablet 10 mg  10 mg Oral TID Frankey Shown, DO  10 mg at 03/04/23 2129   dextromethorphan-guaiFENesin (MUCINEX DM) 30-600 MG per 12 hr tablet 2 tablet  2 tablet Oral BID Rodolph Bong, MD   2 tablet at 03/04/23 2129   doxepin (SINEQUAN) capsule 50 mg  50 mg Oral Daily Adefeso, Oladapo, DO   50 mg at 03/04/23 0848   DULoxetine (CYMBALTA) DR capsule 120 mg  120 mg Oral Daily Adefeso, Oladapo, DO   120 mg at 03/04/23 0848   enoxaparin (LOVENOX) injection 90 mg  90 mg Subcutaneous Q24H Adefeso, Oladapo, DO   90 mg at 03/04/23 0848   fluticasone  (FLONASE) 50 MCG/ACT nasal spray 2 spray  2 spray Each Nare Daily Rodolph Bong, MD   2 spray at 03/04/23 0848   furosemide (LASIX) tablet 20 mg  20 mg Oral Daily Elgergawy, Leana Roe, MD   20 mg at 03/04/23 0848   HYDROcodone-acetaminophen (NORCO/VICODIN) 5-325 MG per tablet 1 tablet  1 tablet Oral Q4H PRN Elgergawy, Leana Roe, MD   1 tablet at 03/04/23 2129   ipratropium-albuterol (DUONEB) 0.5-2.5 (3) MG/3ML nebulizer solution 3 mL  3 mL Nebulization Q4H PRN Adefeso, Oladapo, DO       ipratropium-albuterol (DUONEB) 0.5-2.5 (3) MG/3ML nebulizer solution 3 mL  3 mL Nebulization BID Leroy Sea, MD   3 mL at 03/05/23 0848   mirtazapine (REMERON) tablet 15 mg  15 mg Oral QHS Harris, Abigail, PA-C   15 mg at 03/04/23 2129   montelukast (SINGULAIR) tablet 10 mg  10 mg Oral QHS Adefeso, Oladapo, DO   10 mg at 03/04/23 2129   Muscle Rub CREA   Topical PRN Elgergawy, Leana Roe, MD   Given at 03/03/23 1646   ondansetron (ZOFRAN) tablet 4 mg  4 mg Oral Q6H PRN Adefeso, Oladapo, DO       Or   ondansetron (ZOFRAN) injection 4 mg  4 mg Intravenous Q6H PRN Adefeso, Oladapo, DO   4 mg at 02/27/23 1025   pantoprazole (PROTONIX) EC tablet 40 mg  40 mg Oral Daily Adefeso, Oladapo, DO   40 mg at 03/04/23 8469     Discharge Medications: Please see discharge summary for a list of discharge medications.  Relevant Imaging Results:  Relevant Lab Results:   Additional Information SSN [id number]. Weighs 417lbs  Mearl Latin, LCSW

## 2023-03-05 NOTE — Plan of Care (Signed)
  Problem: Health Behavior/Discharge Planning: Goal: Ability to manage health-related needs will improve Outcome: Progressing   Problem: Clinical Measurements: Goal: Ability to maintain clinical measurements within normal limits will improve Outcome: Progressing Goal: Will remain free from infection Outcome: Progressing   Problem: Activity: Goal: Risk for activity intolerance will decrease Outcome: Progressing   Problem: Pain Management: Goal: General experience of comfort will improve Outcome: Progressing

## 2023-03-05 NOTE — Telephone Encounter (Signed)
Noted. Follow-up with Primary Care after discharge from hospital and [facility].

## 2023-03-06 ENCOUNTER — Other Ambulatory Visit (HOSPITAL_COMMUNITY): Payer: Self-pay

## 2023-03-06 ENCOUNTER — Encounter: Payer: 59 | Admitting: Family Medicine

## 2023-03-06 DIAGNOSIS — J9622 Acute and chronic respiratory failure with hypercapnia: Secondary | ICD-10-CM | POA: Diagnosis not present

## 2023-03-06 DIAGNOSIS — J9621 Acute and chronic respiratory failure with hypoxia: Secondary | ICD-10-CM | POA: Diagnosis not present

## 2023-03-06 MED ORDER — PANTOPRAZOLE SODIUM 40 MG PO TBEC
40.0000 mg | DELAYED_RELEASE_TABLET | Freq: Every day | ORAL | 0 refills | Status: DC
  Filled 2023-03-06: qty 30, 30d supply, fill #0

## 2023-03-06 NOTE — Progress Notes (Signed)
Physical Therapy Treatment Patient Details Name: Janice Ryan MRN: [id number] DOB: [birth year] Today's Date: 03/06/2023   History of Present Illness 67 year old female with history of asthma, OSA/OHS uses BiPAP at night, chronic hypoxic hypercapnic respiratory failure on supplemental baseline O2 of 5 L nasal cannula, allergic rhinitis, chronic right knee pain presented to the ED from SNF due to shortness of breath. Patient also noted to have presented from rehab facility due to worsening right knee pain after not receiving medications on day of admission with worsening shortness of breath and increased work of breathing.    PT Comments  Pt received in supine and agreeable to session. Linens noted to be soiled, so pt assisted with hygiene tasks. Pt requires mod A to stand with cues for hand placement each trial. Pt demonstrates good stability with step pivot transfers with RW support, however is unable to progress gait due to R knee pain and quick fatigue. Discussed home set up and assist available with pt reporting her son is present and able to assist daily. Updated equipment recs to include a bari RW for improved support and safety after discussion with supervising PT Zain B. Pt continues to benefit from PT services to progress toward functional mobility goals.    If plan is discharge home, recommend the following: Two people to help with walking and/or transfers;Two people to help with bathing/dressing/bathroom;Assistance with cooking/housework;Assist for transportation;Help with stairs or ramp for entrance   Can travel by private vehicle     No  Equipment Recommendations  Rolling walker (2 wheels) (bari)    Recommendations for Other Services       Precautions / Restrictions Precautions Precautions: Fall Precaution Comments: monitor O2 on chronic O2 at home Required Braces or Orthoses: Knee Immobilizer - Right Knee Immobilizer - Right: On when out of bed or walking      Mobility  Bed Mobility Overal bed mobility: Needs Assistance Bed Mobility: Supine to Sit     Supine to sit: Mod assist     General bed mobility comments: Mod A for trunk elevation, RLE advancement, and scooting forward to EOB with bedpad    Transfers Overall transfer level: Needs assistance Equipment used: Rolling walker (2 wheels) Transfers: Sit to/from Stand, Bed to chair/wheelchair/BSC Sit to Stand: Mod assist   Step pivot transfers: Min assist       General transfer comment: STS from EOB, recliner x3, and BSC. Min A for balance to pivot to/from [facility] with heavy reliance on RW for support    Ambulation/Gait             Pre-gait activities: static marching General Gait Details: unable due to pain     Balance Overall balance assessment: Needs assistance Sitting-balance support: Bilateral upper extremity supported, No upper extremity supported Sitting balance-Leahy Scale: Good     Standing balance support: Bilateral upper extremity supported, Reliant on assistive device for balance, During functional activity Standing balance-Leahy Scale: Poor Standing balance comment: with RW support                            Cognition Arousal: Alert Behavior During Therapy: WFL for tasks assessed/performed Overall Cognitive Status: Within Functional Limits for tasks assessed                                          Exercises General Exercises -  Lower Extremity Hip Flexion/Marching: AROM, Standing, Both, 10 reps    General Comments        Pertinent Vitals/Pain Pain Assessment Pain Assessment: 0-10 Pain Score: 8  Pain Location: R knee Pain Descriptors / Indicators: Aching, Discomfort, Guarding Pain Intervention(s): Limited activity within patient's tolerance, Monitored during session     PT Goals (current goals can now be found in the care plan section) Acute Rehab PT Goals Patient Stated Goal: to go home PT Goal Formulation:  With patient Time For Goal Achievement: 03/13/23 Progress towards PT goals: Progressing toward goals    Frequency    Min 1X/week       AM-PAC PT "6 Clicks" Mobility   Outcome Measure  Help needed turning from your back to your side while in a flat bed without using bedrails?: A Little Help needed moving from lying on your back to sitting on the side of a flat bed without using bedrails?: A Lot Help needed moving to and from a bed to a chair (including a wheelchair)?: A Lot Help needed standing up from a chair using your arms (e.g., wheelchair or bedside chair)?: A Lot Help needed to walk in hospital room?: Total Help needed climbing 3-5 steps with a railing? : Total 6 Click Score: 11    End of Session Equipment Utilized During Treatment: Oxygen;Gait belt;Right knee immobilizer Activity Tolerance: Patient limited by pain Patient left: with call bell/phone within reach;in chair Nurse Communication: Mobility status PT Visit Diagnosis: Unsteadiness on feet (R26.81);Other abnormalities of gait and mobility (R26.89);Difficulty in walking, not elsewhere classified (R26.2)     Time: 2956-2130 PT Time Calculation (min) (ACUTE ONLY): 51 min  Charges:    $Therapeutic Activity: 38-52 mins PT General Charges $$ ACUTE PT VISIT: 1 Visit                     Johny Shock, PTA Acute Rehabilitation Services Secure Chat Preferred  [phone]    Johny Shock 03/06/2023, 11:06 AM

## 2023-03-06 NOTE — Plan of Care (Signed)
  Problem: Health Behavior/Discharge Planning: Goal: Ability to manage health-related needs will improve Outcome: Progressing   Problem: Clinical Measurements: Goal: Ability to maintain clinical measurements within normal limits will improve Outcome: Progressing Goal: Respiratory complications will improve Outcome: Progressing   Problem: Pain Management: Goal: General experience of comfort will improve Outcome: Progressing

## 2023-03-06 NOTE — Progress Notes (Deleted)
Due there persistent leukocytosis, Dr. [name] would like to change ceftriaxone to [facility] for now until ID sees.  Ulyses Southward, PharmD, BCIDP, AAHIVP, CPP Infectious Disease Pharmacist 03/06/2023 10:11 AM

## 2023-03-06 NOTE — Discharge Instructions (Signed)
[facility] Department of Social Services for assistance with long term care nursing facility placement: [address] [place], [place] [address] [phone]

## 2023-03-06 NOTE — Progress Notes (Signed)
Occupational Therapy Treatment Patient Details Name: Janice Ryan MRN: [id number] DOB: February 28, 1956 Today's Date: 03/06/2023   History of present illness 67 year old female with history of asthma, OSA/OHS uses BiPAP at night, chronic hypoxic hypercapnic respiratory failure on supplemental baseline O2 of 5 L nasal cannula, allergic rhinitis, chronic right knee pain presented to the ED from SNF due to shortness of breath. Patient also noted to have presented from rehab facility due to worsening right knee pain after not receiving medications on day of admission with worsening shortness of breath and increased work of breathing.   OT comments  Pt is making steady progress towards their acute OT goals. Per chart, pt has decided to discharge home instead of rehab. Overall she continues to require mod A for transfers and CGA/min A for pivotal stepping with heavy reliance on RW. Maximal education provided on home safety and compensatory techniques for ADLs, pt was receptive. Advised pt to purchase a tub bench, and to sponge bathe only until the tub bench is available with +2 assist. Pt noted to have limited insight to deficits and safety and unfortunately remains a very high fall risk. OT to continue to follow acutely to facilitate progress towards established goals. Pt will continue to benefit from skilled inpatient follow up therapy, <3 hours/day; however will need maximal home health services and 24/7 direct physical assist from family.  DME pt inquired about: bari RW, bari tub bench, bed rails, SCDs       If plan is discharge home, recommend the following:  A lot of help with walking and/or transfers;A lot of help with bathing/dressing/bathroom;Assistance with cooking/housework;Direct supervision/assist for medications management;Direct supervision/assist for financial management;Assist for transportation;Help with stairs or ramp for entrance   Equipment Recommendations  Tub/shower  bench;BSC/3in1 (bari equipment)       Precautions / Restrictions Precautions Precautions: Fall Precaution Comments: monitor O2 on chronic O2 at home Required Braces or Orthoses: Knee Immobilizer - Right Knee Immobilizer - Right: On when out of bed or walking Restrictions Weight Bearing Restrictions: Yes RLE Weight Bearing: Weight bearing as tolerated       Mobility Bed Mobility Overal bed mobility: Needs Assistance             General bed mobility comments: OOB upon arrival    Transfers Overall transfer level: Needs assistance Equipment used: Rolling walker (2 wheels) Transfers: Sit to/from Stand, Bed to chair/wheelchair/BSC Sit to Stand: Mod assist           General transfer comment: continues to need sigificant assist to stand and only tolerates pivotal stepping with heavy use of RW     Balance Overall balance assessment: Needs assistance Sitting-balance support: Bilateral upper extremity supported, No upper extremity supported Sitting balance-Leahy Scale: Good     Standing balance support: Bilateral upper extremity supported, Reliant on assistive device for balance, During functional activity Standing balance-Leahy Scale: Poor                             ADL either performed or assessed with clinical judgement   ADL Overall ADL's : Needs assistance/impaired                 Upper Body Dressing : Set up;Sitting Upper Body Dressing Details (indicate cue type and reason): for over head dress     Toilet Transfer: Moderate assistance;Stand-pivot;Rolling walker (2 wheels) Toilet Transfer Details (indicate cue type and reason): continues to need mod A to stand from surfaces  Tub/Shower Transfer Details (indicate cue type and reason): educated on tub bench, did not actually practice tub bench transfer. Advised pt to sponge bathe until she has a tub bench and +2 assist at home Functional mobility during ADLs: Moderate assistance;Rolling  walker (2 wheels) General ADL Comments: maximal education given on home safety and compensatory techniques    Extremity/Trunk Assessment Upper Extremity Assessment Upper Extremity Assessment: Generalized weakness   Lower Extremity Assessment Lower Extremity Assessment: Defer to PT evaluation        Vision   Vision Assessment?: No apparent visual deficits   Perception Perception Perception: Not tested   Praxis Praxis Praxis: Not tested    Cognition Arousal: Alert Behavior During Therapy: WFL for tasks assessed/performed Overall Cognitive Status: Within Functional Limits for tasks assessed             General Comments: Pleasant but limited insight to safety, deficits and problem solving              General Comments VSS with O2    Pertinent Vitals/ Pain       Pain Assessment Pain Assessment: Faces Faces Pain Scale: Hurts little more Pain Location: R knee Pain Descriptors / Indicators: Aching, Discomfort, Guarding Pain Intervention(s): Limited activity within patient's tolerance, Monitored during session   Frequency  Min 1X/week        Progress Toward Goals  OT Goals(current goals can now be found in the care plan section)  Progress towards OT goals: Progressing toward goals  Acute Rehab OT Goals Patient Stated Goal: to go home OT Goal Formulation: With patient Time For Goal Achievement: 02/21/23 Potential to Achieve Goals: Fair ADL Goals Pt Will Perform Grooming: with contact guard assist;standing Pt Will Perform Lower Body Dressing: with min assist;sit to/from stand;with adaptive equipment Pt Will Transfer to Toilet: with min assist;ambulating Additional ADL Goal #1: Pt will indep manage wear schedule of R KI for all ADLs and functional mobility   AM-PAC OT "6 Clicks" Daily Activity     Outcome Measure   Help from another person eating meals?: None Help from another person taking care of personal grooming?: A Little Help from another person  toileting, which includes using toliet, bedpan, or urinal?: A Lot Help from another person bathing (including washing, rinsing, drying)?: A Lot Help from another person to put on and taking off regular upper body clothing?: A Little Help from another person to put on and taking off regular lower body clothing?: A Lot 6 Click Score: 16    End of Session Equipment Utilized During Treatment: Oxygen  OT Visit Diagnosis: Unsteadiness on feet (R26.81);Other abnormalities of gait and mobility (R26.89);Muscle weakness (generalized) (M62.81);Pain   Activity Tolerance Patient tolerated treatment well   Patient Left in chair;with call bell/phone within reach   Nurse Communication Mobility status        Time: 1110-1131 OT Time Calculation (min): 21 min  Charges: OT General Charges $OT Visit: 1 Visit OT Treatments $Self Care/Home Management : 8-22 mins  Derenda Mis, OTR/L Acute Rehabilitation Services Office [phone] Secure Chat Communication Preferred   Donia Pounds 03/06/2023, 12:47 PM

## 2023-03-06 NOTE — TOC Transition Note (Signed)
Transition of Care [facility]) - CM/SW Discharge Note   Patient Details  Name: Janice Ryan MRN: [id number] Date of Birth: 12-25-1955  Transition of Care [facility]) CM/SW Contact:  Harriet Masson, RN Phone Number: 03/06/2023, 11:19 AM   Clinical Narrative:    Patient stable for discharge. ' NIV was delivered to the room and bariatric walker will be delivered to the address on file. Cory with [facility] aware of discharge.  [facility]AR called to transport home. Family are home. Address, Phone number and PCP verified.   Final next level of care: Home w Home Health Services Barriers to Discharge: Barriers Resolved   Patient Goals and CMS Choice    Return home  Discharge Placement    home                     Discharge Plan and Services Additional resources added to the After Visit Summary for                  DME Arranged: Dan Humphreys, NIV DME Agency: [employer] Date DME Agency Contacted: 03/06/23 Time DME Agency Contacted: 1109 Representative spoke with at DME Agency: Vaughan Basta HH Arranged: OT, PT, RN, Social Work [employer] Agency: [facility] Date [facility] Agency Contacted: 03/06/23 Time HH Agency Contacted: 1108 Representative spoke with at [facility] Agency: Kandee Keen  Social Determinants of Health (SDOH) Interventions SDOH Screenings   Food Insecurity: No Food Insecurity (02/26/2023)  Housing: Low Risk  (02/26/2023)  Transportation Needs: Unmet Transportation Needs (02/26/2023)  Utilities: Not At Risk (02/26/2023)  Depression (PHQ2-9): Low Risk  (07/06/2021)  Social Connections: Unknown (10/10/2022)   Received from [facility], [facility]  Tobacco Use: Low Risk  (02/25/2023)     Readmission Risk Interventions    03/06/2023   11:10 AM 02/08/2023    4:56 PM  Readmission Risk Prevention Plan  Post Dischage Appt  Complete  Medication Screening  Complete  Transportation Screening Complete Complete  PCP or Specialist Appt within 5-7 Days Complete   Home Care  Screening Complete   Medication Review (RN CM) Complete

## 2023-03-06 NOTE — Plan of Care (Signed)
  Problem: Education: Goal: Knowledge of General Education information will improve Description: Including pain rating scale, medication(s)/side effects and non-pharmacologic comfort measures Outcome: Completed/Met   Problem: Health Behavior/Discharge Planning: Goal: Ability to manage health-related needs will improve Outcome: Completed/Met   Problem: Clinical Measurements: Goal: Ability to maintain clinical measurements within normal limits will improve Outcome: Completed/Met Goal: Will remain free from infection Outcome: Completed/Met Goal: Diagnostic test results will improve Outcome: Completed/Met Goal: Respiratory complications will improve Outcome: Completed/Met Goal: Cardiovascular complication will be avoided Outcome: Completed/Met   Problem: Activity: Goal: Risk for activity intolerance will decrease Outcome: Completed/Met   Problem: Nutrition: Goal: Adequate nutrition will be maintained Outcome: Completed/Met   Problem: Coping: Goal: Level of anxiety will decrease Outcome: Completed/Met   Problem: Elimination: Goal: Will not experience complications related to bowel motility Outcome: Completed/Met Goal: Will not experience complications related to urinary retention Outcome: Completed/Met   Problem: Pain Management: Goal: General experience of comfort will improve Outcome: Completed/Met   Problem: Safety: Goal: Ability to remain free from injury will improve Outcome: Completed/Met   Problem: Skin Integrity: Goal: Risk for impaired skin integrity will decrease Outcome: Completed/Met   Problem: Education: Goal: Knowledge of disease or condition will improve Outcome: Completed/Met Goal: Knowledge of the prescribed therapeutic regimen will improve Outcome: Completed/Met Goal: Individualized Educational Video(s) Outcome: Completed/Met   Problem: Activity: Goal: Ability to tolerate increased activity will improve Outcome: Completed/Met Goal: Will  verbalize the importance of balancing activity with adequate rest periods Outcome: Completed/Met   Problem: Respiratory: Goal: Ability to maintain a clear airway will improve Outcome: Completed/Met Goal: Levels of oxygenation will improve Outcome: Completed/Met Goal: Ability to maintain adequate ventilation will improve Outcome: Completed/Met

## 2023-03-06 NOTE — Discharge Summary (Signed)
Physician Discharge Summary  Janice Ryan BJY:[id number] DOB: 14-Nov-1955 DOA: 02/25/2023  PCP: [name], NP  Admit date: 02/25/2023 Discharge date: 03/06/2023  Admitted From: (Home) Disposition:  (Home, patient declined the offer from SNF and wants to go home instead.)  Recommendations for Outpatient Follow-up:  Follow up with PCP in 1-2 weeks Please obtain BMP/CBC in one week Please refer patient to weight loss clinic BiPAP has been arranged at time of discharge   Diet recommendation: Heart Healthy   Brief/Interim Summary:  67 year old female with history of asthma, OSA/OHS uses BiPAP at night, chronic hypoxic hypercapnic respiratory failure on supplemental baseline O2 of 5 L nasal cannula, allergic rhinitis, chronic right knee pain presented to the ED from SNF due to shortness of breath. Patient also noted to have presented from rehab facility due to worsening right knee pain after not receiving medications on day of admission with worsening shortness of breath and increased work of breathing. Per admitting physician on EMS arrival patient in respiratory distress, given a breathing treatment and had to be placed on the BiPAP in the ED with improved work of bleeding and improvement of repeated VBG. Influenza A, B, SARS coronavirus 2, RSV was negative. CT angiogram chest negative for PE or infiltrate. Patient admitted, placed on BiPAP, IV steroids, nebulizer treatments    #1 Acute on chronic respiratory failure with hypoxia and hypercapnia - secondary to acute asthma exacerbation, OHS/OSA along with acute on chronic diastolic CHF EF 60% or above on last echocardiogram 1 year ago.  On 5 to 6 L nasal cannula oxygen at home at baseline. - Patient presenting with worsening shortness of breath, hypoxia and hypercapnia, increased work of breathing with increased O2 requirement from baseline home O2.  Had diffuse wheezing and elevated BNP at the time of admission along with evidence of  hypercapnia and hypoxia. -Was initially on IV Lasix, euvolemic, has been transitioned to oral Lasix, no further Lasix on discharge, her home dose hydrochlorothiazide should be sufficient  -Steroids initially, she has been weaned off -She is on Diamox, has been discontinued -Patient with obesity hypoventilatory syndrome, I think her not being on BiPAP has frequently to her presentation, she had couple ABGs which did show evidence of CO2 retention, so BiPAP has been arranged at time of discharge.  Hypokalemia -Repleted.   OSA/OHS -It is noted per admitting physician that patient stated he used CPAP once within the last week at the rehab facility.  Patient placed on BiPAP nightly here and feeling better, BiPAP arranged at time of discharge    Depression -Continue home regimen Elavil, BuSpar, Cymbalta, Remeron.   Morbid obesity -BMI 63.40 kg/m -Lifestyle modification -Outpatient follow-up.   Generalized weakness-deconditioning -At this point patient wants to go home, not SNF, so home health has been arranged at time of discharge  Discharge Diagnoses:  Principal Problem:   Acute on chronic respiratory failure with hypoxia and hypercapnia (HCC) Active Problems:   Depression   OSA (obstructive sleep apnea)   Obesity hypoventilation syndrome (HCC)   Asthma, chronic, unspecified asthma severity, with acute exacerbation   Hypokalemia   Morbid obesity with BMI of 60.0-69.9, adult [facility])    Discharge Instructions  Discharge Instructions     Diet - low sodium heart healthy   Complete by: As directed    Discharge instructions   Complete by: As directed    Follow with Primary MD [name], NP in 7 days   Get CBC, CMP, 2 view Chest X ray checked  by Primary MD next visit.    Activity: As tolerated with Full fall precautions use walker/cane & assistance as needed   Disposition Home    Diet: Heart Healthy  , with feeding assistance and aspiration precautions.   On your  next visit with your primary care physician please Get Medicines reviewed and adjusted.   Please request your Prim.MD to go over all Hospital Tests and Procedure/Radiological results at the follow up, please get all Hospital records sent to your Prim MD by signing hospital release before you go home.   If you experience worsening of your admission symptoms, develop shortness of breath, life threatening emergency, suicidal or homicidal thoughts you must seek medical attention immediately by calling 911 or calling your MD immediately  if symptoms less severe.  You Must read complete instructions/literature along with all the possible adverse reactions/side effects for all the Medicines you take and that have been prescribed to you. Take any new Medicines after you have completely understood and accpet all the possible adverse reactions/side effects.   Do not drive, operating heavy machinery, perform activities at heights, swimming or participation in water activities or provide baby sitting services if your were admitted for syncope or siezures until you have seen by Primary MD or a Neurologist and advised to do so again.  Do not drive when taking Pain medications.    Do not take more than prescribed Pain, Sleep and Anxiety Medications  Special Instructions: If you have smoked or chewed Tobacco  in the last 2 yrs please stop smoking, stop any regular Alcohol  and or any Recreational drug use.  Wear Seat belts while driving.   Please note  You were cared for by a hospitalist during your hospital stay. If you have any questions about your discharge medications or the care you received while you were in the hospital after you are discharged, you can call the unit and asked to speak with the hospitalist on call if the hospitalist that took care of you is not available. Once you are discharged, your primary care physician will handle any further medical issues. Please note that NO REFILLS for any  discharge medications will be authorized once you are discharged, as it is imperative that you return to your primary care physician (or establish a relationship with a primary care physician if you do not have one) for your aftercare needs so that they can reassess your need for medications and monitor your lab values.   Increase activity slowly   Complete by: As directed       Allergies as of 03/06/2023       Reactions   Neurontin [gabapentin] Other (See Comments)   Has un-controlled movement of her body, excessive dribble of saliva at night   Sulfa Antibiotics Nausea And Vomiting, Other (See Comments)   Stomach pain        Medication List     STOP taking these medications    gabapentin 400 MG capsule Commonly known as: NEURONTIN       TAKE these medications    acetaminophen 500 MG tablet Commonly known as: TYLENOL Take 2 tablets (1,000 mg total) by mouth every 8 (eight) hours as needed for mild pain (pain score 1-3) or headache.   albuterol 108 (90 Base) MCG/ACT inhaler Commonly known as: VENTOLIN HFA Inhale 2 puffs into the lungs every 6 (six) hours as needed for wheezing or shortness of breath.   albuterol (2.5 MG/3ML) 0.083% nebulizer solution Commonly known as: PROVENTIL  Take 3 mLs (2.5 mg total) by nebulization every 6 (six) hours as needed for wheezing or shortness of breath.   amitriptyline 25 MG tablet Commonly known as: ELAVIL Take 25 mg by mouth at bedtime.   busPIRone 10 MG tablet Commonly known as: BUSPAR Take 10 mg by mouth 3 (three) times daily. What changed: Another medication with the same name was removed. Continue taking this medication, and follow the directions you see here.   capsicum 0.075 % topical cream Commonly known as: ZOSTRIX Apply topically 2 (two) times daily.   carbamide peroxide 6.5 % OTIC solution Commonly known as: DEBROX Place 4 drops into both ears daily as needed (to clean ears).   cetirizine 10 MG tablet Commonly  known as: ZyrTEC Allergy Take 1 tablet (10 mg total) by mouth daily.   diclofenac Sodium 1 % Gel Commonly known as: Voltaren Apply 2 g topically 4 (four) times daily as needed. What changed:  how much to take when to take this reasons to take this   doxepin 50 MG capsule Commonly known as: SINEQUAN Take 1 capsule (50 mg total) by mouth in the morning.   DULoxetine 60 MG capsule Commonly known as: CYMBALTA Take 2 capsules (120 mg total) by mouth daily.   fluticasone 50 MCG/ACT nasal spray Commonly known as: FLONASE Place 2 sprays into both nostrils daily.   fluticasone-salmeterol 500-50 MCG/ACT Aepb Commonly known as: ADVAIR Inhale 1 puff into the lungs in the morning and at bedtime.   hydrochlorothiazide 12.5 MG tablet Commonly known as: HYDRODIURIL Take 1 tablet (12.5 mg total) by mouth daily.   mirtazapine 15 MG tablet Commonly known as: REMERON Take 15 mg by mouth at bedtime.   montelukast 10 MG tablet Commonly known as: SINGULAIR Take 1 tablet (10 mg total) by mouth at bedtime.   multivitamin with minerals Tabs tablet Take 1 tablet by mouth daily.   NON FORMULARY at bedtime. BIPAP   OMEGA-3 PO Take 1 capsule by mouth daily.   OXYGEN Inhale 5 L into the lungs continuous.   pantoprazole 40 MG tablet Commonly known as: PROTONIX Take 1 tablet (40 mg total) by mouth daily. Start taking on: March 07, 2023   traMADol 50 MG tablet Commonly known as: ULTRAM Take 1 tablet (50 mg total) by mouth every 12 (twelve) hours as needed for severe pain (pain score 7-10). What changed:  when to take this reasons to take this        Follow-up Information     [facility] Follow up.   Specialty: Home Health Services Why: [facility] will contact you within 48 hours of DC to arrange a home visit for home health Contact information: [address] [place] [address] [phone]         [facility] Follow up.   Why: [facility] has provded the  nightime NIV Contact information: [id number]  [place] [facility] [place], [place]  [address]  [phone]        [name], NP Follow up.   Specialty: Nurse Practitioner Contact information: [address] [place] [address] [phone]         Erick Blinks, MD Follow up.   Specialty: Internal Medicine Why: please Call and schedule an appointment for weight loss clinic. Contact information: [address] [place] [address] [phone]                Allergies  Allergen Reactions   Neurontin [Gabapentin] Other (See Comments)  Has un-controlled movement of her body, excessive dribble of saliva at night   Sulfa Antibiotics Nausea And Vomiting and Other (See Comments)    Stomach pain    Consultations: None   Procedures/Studies: DG Chest Port 1 View  Result Date: 02/27/2023 CLINICAL DATA:  Shortness of breath EXAM: PORTABLE CHEST 1 VIEW COMPARISON:  Chest CT from 2 days ago FINDINGS: Cardiomegaly with vascular congestion and vascular pedicle widening. There is no edema, consolidation, effusion, or pneumothorax. IMPRESSION: Cardiomegaly and vascular congestion. Electronically Signed   By: Tiburcio Pea M.D.   On: 02/27/2023 06:27   CT Angio Chest PE W and/or Wo Contrast  Result Date: 02/25/2023 CLINICAL DATA:  Pulmonary embolism (PE) suspected, high prob. Shortness of breath. Arm pain. EXAM: CT ANGIOGRAPHY CHEST WITH CONTRAST TECHNIQUE: Multidetector CT imaging of the chest was performed using the standard protocol during bolus administration of intravenous contrast. Multiplanar CT image reconstructions and MIPs were obtained to evaluate the vascular anatomy. RADIATION DOSE REDUCTION: This exam was performed according to the departmental dose-optimization program which includes automated exposure control, adjustment of the mA and/or kV according to patient size and/or use of iterative reconstruction technique. CONTRAST:   OMNIPAQUE IOHEXOL 350 MG/ML SOLN COMPARISON:  CT angiography chest from 02/07/2023. FINDINGS: Cardiovascular: Evaluation of pulmonary embolism beyond the segmental branches is limited due to suboptimal contrast-enhancement, patient's respiratory motion and streak artifacts from contrast within the superior vena cava. There is no embolism to the segmental pulmonary artery level. Mild cardiomegaly. No pericardial effusion. Redemonstration of lipomatous hypertrophy of the interatrial septum. No aortic aneurysm. There is dilation of the main pulmonary trunk measuring up to 4.1 cm, which is nonspecific but can be seen with pulmonary artery hypertension. Mediastinum/Nodes: Visualized thyroid gland appears grossly unremarkable. Redemonstration of a 2.2 x 2.6 cm soft tissue attenuation structure in the anterior mediastinum, unchanged since the prior study dating back to July 28, 2018 and favored to represent a slightly complex/proteinaceous cyst. The esophagus is nondistended precluding optimal assessment. No axillary, mediastinal or hilar lymphadenopathy by size criteria. Lungs/Pleura: The central tracheo-bronchial tree is patent. There are patchy areas of linear, plate-like atelectasis and/or scarring throughout bilateral lungs. No mass or consolidation. No pleural effusion or pneumothorax. No suspicious lung nodules. Upper Abdomen: Visualized upper abdominal viscera within normal limits. Musculoskeletal: The visualized soft tissues of the chest wall are grossly unremarkable. No suspicious osseous lesions. There are mild to moderate multilevel degenerative changes in the visualized spine. Review of the MIP images confirms the above findings. IMPRESSION: 1. No embolism noted to the segmental pulmonary artery level, as described above. 2. No lung mass, consolidation, pleural effusion or pneumothorax. 3. Multiple other nonacute observations, as described above. Electronically Signed   By: Jules Schick M.D.   On:  02/25/2023 17:06   DG Chest 2 View  Result Date: 02/25/2023 CLINICAL DATA:  Shortness of breath. EXAM: CHEST - 2 VIEW COMPARISON:  02/07/2023. FINDINGS: Low lung volume. Bilateral lung fields are clear. Bilateral costophrenic angles are clear. Stable cardio-mediastinal silhouette. There is mild convexity of the main pulmonary artery silhouette, nonspecific but can be seen with pulmonary artery hypertension. No acute osseous abnormalities. The soft tissues are within normal limits. IMPRESSION: *No active cardiopulmonary disease. Electronically Signed   By: Jules Schick M.D.   On: 02/25/2023 12:40   MR KNEE RIGHT WO CONTRAST  Result Date: 02/11/2023 CLINICAL DATA:  Larey Seat.  Right knee pain. EXAM: MRI OF THE RIGHT KNEE WITHOUT CONTRAST TECHNIQUE: Multiplanar, multisequence MR imaging of  the knee was performed. No intravenous contrast was administered. COMPARISON:  Radiographs 02/07/2023 FINDINGS: Examination is limited by body habitus and motion artifact. MENISCI Medial meniscus: Severely degenerated and extensively torn. The meniscus is displaced medially suggesting a large radial tear, not well demonstrated. Lateral meniscus:  Intact LIGAMENTS Cruciates: Suspect chronic ACL deficient knee versus severe mucoid degeneration. The PCL is intact. Collaterals:  Intact CARTILAGE Patellofemoral: Severe degenerative changes with full-thickness cartilage loss, joint space narrowing and osteophytic spurring. Medial: Severe degenerative changes with bone-on-bone appearance, osteophytic spurring and subchondral cystic change. Lateral: Moderate degenerative chondrosis with moderate spurring changes. Joint: Moderate-sized joint effusion and moderate synovitis. Loose ossified bodies in the joint. Popliteal Fossa:  Moderate-sized leaking Baker's cyst. Extensor Mechanism: The patella retinacular structures are intact and the quadriceps patellar tendons are intact. Bones:  No acute bony findings.  No fracture. Other:  Unremarkable knee musculature. IMPRESSION: 1. Severely degenerated and extensively torn medial meniscus. 2. Suspect chronic ACL deficient knee versus severe mucoid degeneration. 3. Intact PCL and collateral ligaments. 4. Severe tricompartmental degenerative changes. 5. Moderate-sized joint effusion and moderate synovitis. Loose ossified bodies in the joint. 6. Moderate-sized leaking Baker's cyst. Electronically Signed   By: Rudie Meyer M.D.   On: 02/11/2023 12:04   CT Angio Chest PE W and/or Wo Contrast  Result Date: 02/07/2023 CLINICAL DATA:  Concern for pulmonary embolism. EXAM: CT ANGIOGRAPHY CHEST WITH CONTRAST TECHNIQUE: Multidetector CT imaging of the chest was performed using the standard protocol during bolus administration of intravenous contrast. Multiplanar CT image reconstructions and MIPs were obtained to evaluate the vascular anatomy. RADIATION DOSE REDUCTION: This exam was performed according to the departmental dose-optimization program which includes automated exposure control, adjustment of the mA and/or kV according to patient size and/or use of iterative reconstruction technique. CONTRAST:  OMNIPAQUE IOHEXOL 350 MG/ML SOLN COMPARISON:  Chest CT dated 03/22/2019. FINDINGS: Cardiovascular: Top-normal cardiac size. No pericardial effusion. There is lipomatous hypertrophy of the interatrial septum. Mild atherosclerotic calcification of the thoracic aorta. No aneurysmal dilatation or dissection. The origins of the great vessels of the aortic arch appear patent. There is dilatation of the main pulmonary trunk suggestive of pulmonary hypertension. No pulmonary artery embolus identified Mediastinum/Nodes: No hilar or mediastinal adenopathy. The esophagus is grossly unremarkable. Stable 2.5 cm rounded lesion in the anterior mediastinum, likely a benign process. No mediastinal fluid collection. Lungs/Pleura: Right lung base linear atelectasis/scarring. No focal consolidation, pleural effusion,  or pneumothorax. The central airways are patent. Upper Abdomen: Noncalcified gallstones. Musculoskeletal: Osteopenia with degenerative changes. No acute osseous pathology. Review of the MIP images confirms the above findings. IMPRESSION: 1. No acute intrathoracic pathology. No CT evidence of pulmonary embolism. 2. Dilatation of the main pulmonary trunk suggestive of pulmonary hypertension. 3. Cholelithiasis. 4.  Aortic Atherosclerosis (ICD10-I70.0). Electronically Signed   By: Elgie Collard M.D.   On: 02/07/2023 20:54   DG Chest Port 1 View  Result Date: 02/07/2023 CLINICAL DATA:  Shortness of breath EXAM: PORTABLE CHEST 1 VIEW COMPARISON:  01/23/2022 FINDINGS: Cardiomegaly. No frank interstitial edema. No pleural effusion or pneumothorax. IMPRESSION: Cardiomegaly. No frank interstitial edema. Electronically Signed   By: Charline Bills M.D.   On: 02/07/2023 19:19   DG Tibia/Fibula Right  Result Date: 02/07/2023 CLINICAL DATA:  Larey Seat when washing her hair.  Pain. EXAM: PORTABLE RIGHT KNEE - 1-2 VIEW; RIGHT TIBIA AND FIBULA - 2 VIEW COMPARISON:  Right knee radiographs 01/20/2022, right tibia and fibula radiographs 08/25/2021 FINDINGS: Right knee: There is diffuse decreased bone mineralization.  Bone-on-bone contact of the medial compartment. Large peripheral medial degenerative osteophytes. Moderate peripheral lateral compartment degenerative osteophytosis without joint space narrowing. Degenerative genu varum again noted. Severe patellofemoral joint space narrowing bone-on-bone contact and large peripheral osteophytes. There is an impaction of 14 mm ossicle within the suprapatellar joint space. Right tibia and fibula: There is a 6 mm chronic well corticated ossicle distal to the fibula, likely the sequela of remote trauma. Mild medial greater than lateral tibiotalar joint space narrowing. Mild distal anterior tibial plafond degenerative osteophytosis. Mild navicular-cuneiform joint space narrowing and  dorsal osteophytosis on lateral view. No acute fracture or dislocation. IMPRESSION: 1. Severe medial and patellofemoral compartment osteoarthritis, similar to prior. 2. Mild tibiotalar and navicular-cuneiform osteoarthritis. 3. No acute fracture. Electronically Signed   By: Neita [name] M.D.   On: 02/07/2023 09:33   DG Knee Right Port  Result Date: 02/07/2023 CLINICAL DATA:  Larey Seat when washing her hair.  Pain. EXAM: PORTABLE RIGHT KNEE - 1-2 VIEW; RIGHT TIBIA AND FIBULA - 2 VIEW COMPARISON:  Right knee radiographs 01/20/2022, right tibia and fibula radiographs 08/25/2021 FINDINGS: Right knee: There is diffuse decreased bone mineralization. Bone-on-bone contact of the medial compartment. Large peripheral medial degenerative osteophytes. Moderate peripheral lateral compartment degenerative osteophytosis without joint space narrowing. Degenerative genu varum again noted. Severe patellofemoral joint space narrowing bone-on-bone contact and large peripheral osteophytes. There is an impaction of 14 mm ossicle within the suprapatellar joint space. Right tibia and fibula: There is a 6 mm chronic well corticated ossicle distal to the fibula, likely the sequela of remote trauma. Mild medial greater than lateral tibiotalar joint space narrowing. Mild distal anterior tibial plafond degenerative osteophytosis. Mild navicular-cuneiform joint space narrowing and dorsal osteophytosis on lateral view. No acute fracture or dislocation. IMPRESSION: 1. Severe medial and patellofemoral compartment osteoarthritis, similar to prior. 2. Mild tibiotalar and navicular-cuneiform osteoarthritis. 3. No acute fracture. Electronically Signed   By: Neita [name] M.D.   On: 02/07/2023 09:33   CT PELVIS WO CONTRAST  Result Date: 02/07/2023 CLINICAL DATA:  fall EXAM: CT PELVIS WITHOUT CONTRAST TECHNIQUE: Multidetector CT imaging of the pelvis was performed following the standard protocol without intravenous contrast. RADIATION DOSE REDUCTION:  This exam was performed according to the departmental dose-optimization program which includes automated exposure control, adjustment of the mA and/or kV according to patient size and/or use of iterative reconstruction technique. COMPARISON:  None Available. FINDINGS: Urinary Tract:  No abnormality visualized. Bowel:  Unremarkable visualized pelvic bowel loops. Vascular/Lymphatic: No pathologically enlarged lymph nodes. No significant vascular abnormality seen. Reproductive:  Fibroid uterus. Musculoskeletal: No acute osseous abnormality. The bilateral femoral heads are seated in the acetabula. Degenerative changes of the bilateral hips with superolateral joint space narrowing, marginal osteophytosis and subchondral cystic changes. The sacroiliac joints and pubic symphysis are anatomically aligned with degenerative changes. Multilevel degenerative changes of the visualized lower lumbar spine. IMPRESSION: 1. No acute osseous abnormality. 2. Moderate osteoarthritis of the hips. Electronically Signed   By: Hart Robinsons M.D.   On: 02/07/2023 09:14   CT Thoracic Spine Wo Contrast  Result Date: 02/07/2023 CLINICAL DATA:  Provided history: Fall.  Mid back pain.  Leg pain. EXAM: CT THORACIC SPINE WITHOUT CONTRAST TECHNIQUE: Multidetector CT images of the thoracic were obtained using the standard protocol without intravenous contrast. RADIATION DOSE REDUCTION: This exam was performed according to the departmental dose-optimization program which includes automated exposure control, adjustment of the mA and/or kV according to patient size and/or use of iterative reconstruction technique. COMPARISON:  Chest  CT 03/22/2019. FINDINGS: Alignment: No significant spondylolisthesis. Vertebrae: Mild chronic anterior wedge deformity of the T7 vertebral body, unchanged from the prior chest CT of 03/22/2019. Vertebral body height is maintained at the remaining thoracic levels. No evidence of an acute fracture to the thoracic spine.  Osseous fusion across the disc space at T8-T9. Early osseous fusion also suspected across the T9-T10 disc space. Ventrolateral osteophytes at T6-T7, T7-T8, T8-T9 and T9-T10. Hemangiomas within the T3 and T5 vertebrae. Paraspinal and other soft tissues: No acute finding within included portions of the thorax or upper abdomen/retroperitoneum. Aortic atherosclerosis. No paraspinal mass or collection. Disc levels: Osseous fusion across the disc space at T8-T9. Early osseous fusion also suspected across the T9-T10 disc space. Disc space narrowing elsewhere within the thoracic spine, greatest at T7-T8 (moderate-to-advanced at this level). No significant spinal canal stenosis is appreciated. Endplate spurring results in bilateral bony neural foraminal narrowing at T9-T10 (moderate right, mild left). IMPRESSION: 1. No evidence of an acute thoracic spine fracture. 2. Mild chronic anterior wedge deformity of the T7 vertebral body, unchanged from the prior chest CT of 03/22/2019. 3. Thoracic spondylosis as described. No significant spinal canal stenosis is appreciated. Endplate spurring results in bilateral bony neural foraminal narrowing at T9-T10 (moderate right, mild left). Osseous fusion across the T8-T9 disc space. Early osseous fusion also suspected across the T9-T10 disc space. Electronically Signed   By: Jackey Loge D.O.   On: 02/07/2023 08:32   CT Lumbar Spine Wo Contrast  Result Date: 02/07/2023 CLINICAL DATA:  67 year old female status post fall with pain. EXAM: CT LUMBAR SPINE WITHOUT CONTRAST TECHNIQUE: Multidetector CT imaging of the lumbar spine was performed without intravenous contrast administration. Multiplanar CT image reconstructions were also generated. RADIATION DOSE REDUCTION: This exam was performed according to the departmental dose-optimization program which includes automated exposure control, adjustment of the mA and/or kV according to patient size and/or use of iterative reconstruction  technique. COMPARISON:  CT cervical and thoracic Spine today reported separately. 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 Correlation with radiographs is recommended prior to any operative intervention. Alignment: Mildly exaggerated lumbar lordosis. No significant scoliosis or spondylolisthesis. Vertebrae: Thoracic spine reported separately. Lumbar vertebrae appear intact. L3 vertebral body benign hemangioma (normal variant). Degenerative endplate changes L2-L3 through L5-S1. Visible sacrum and SI joints appear intact with degenerative vacuum SI joint phenomena. Paraspinal and other soft tissues: Costophrenic angle respiratory motion. Negative visible noncontrast abdominal viscera. Calcified iliac artery atherosclerosis. Lumbar paraspinal soft tissues are within normal limits. Disc levels: Mild for age spine degeneration T12-L1 and L1-L2. L2-L3: Disc space loss and vacuum disc. Circumferential disc bulge. Mild to moderate facet and ligament flavum hypertrophy. Vacuum facet on the right. Mild spinal stenosis suspected. Mild L2 foraminal stenosis. L3-L4: Similar vacuum disc. Moderate to severe facet and ligament flavum hypertrophy and vacuum facet on the right. Circumferential disc bulge contributing to mild to moderate spinal stenosis at this level. Mild to moderate left, mild right L3 foraminal stenosis. L4-L5: Less pronounced vacuum disc. Circumferential disc bulge. Moderate to severe facet and ligament flavum hypertrophy. Vacuum facet on the right. Mild to moderate spinal stenosis suspected. Severe bilateral L4 neural foraminal stenosis. L5-S1:  Sacralized and negative. IMPRESSION: 1. 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 Correlation with radiographs is recommended prior to any operative intervention. 2. No acute traumatic injury identified in the Lumbar Spine. 3. 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 Electronically Signed   By: Odessa Fleming M.D.   On: 02/07/2023 08:28   CT Head Wo Contrast  Result Date: 02/07/2023 CLINICAL DATA:  Provided history: Polytrauma, blunt. Fall. Back pain. Leg pain. EXAM: CT HEAD WITHOUT CONTRAST CT CERVICAL SPINE WITHOUT CONTRAST TECHNIQUE: Multidetector CT imaging of the head and cervical spine was performed following the standard protocol without intravenous contrast. Multiplanar CT image reconstructions of the cervical spine were also generated. RADIATION DOSE REDUCTION: This exam was performed according to the departmental dose-optimization program which includes automated exposure control, adjustment of the mA and/or kV according to patient size and/or use of iterative reconstruction technique. COMPARISON:  None. FINDINGS: CT HEAD FINDINGS Brain: No age advanced or lobar predominant parenchymal atrophy. Mild-to-moderate patchy and ill-defined hypoattenuation within the cerebral white matter. There are a few small nonspecific calcifications scattered within the bilateral cerebral hemispheres. There is no acute intracranial hemorrhage. No demarcated cortical infarct. No extra-axial fluid collection. No evidence of an intracranial mass. No midline shift. Vascular: No hyperdense vessel.  Atherosclerotic calcifications. Skull: No calvarial fracture or aggressive osseous lesion. Sinuses/Orbits: No mass or acute finding within the imaged orbits. No significant paranasal sinus disease at the imaged levels. CT CERVICAL SPINE FINDINGS Alignment: Straightening of the expected cervical lordosis. No significant spondylolisthesis. Skull base and vertebrae: The basion-dental and atlanto-dental intervals are maintained.No evidence of acute fracture to the cervical spine. Soft tissues and spinal canal: No prevertebral fluid or swelling. No visible canal hematoma. Disc  levels: Cervical spondylosis with multilevel disc space narrowing, disc bulges/central disc protrusions, posterior disc osteophyte complexes and uncovertebral hypertrophy. Disc space narrowing is greatest at C4-C5 and C5-C6 (advanced at these levels). Multilevel spinal canal stenosis. Most notably at C5-C6, a posterior disc osteophyte complex contributes to spinal canal stenosis which appears moderate in severity. Multilevel bony neural foraminal narrowing. Upper chest: No consolidation within the imaged lung apices. No visible pneumothorax. IMPRESSION: CT head: 1. No evidence of an acute intracranial abnormality. 2. Mild-to-moderate, nonspecific cerebral white matter disease. CT cervical spine: 1. No evidence of an acute cervical spine fracture. 2. Nonspecific straightening of the expected cervical lordosis. 3. Cervical spondylosis as described. Electronically Signed   By: Jackey Loge D.O.   On: 02/07/2023 08:17   CT Cervical Spine Wo Contrast  Result Date: 02/07/2023 CLINICAL DATA:  Provided history: Polytrauma, blunt. Fall. Back pain. Leg pain. EXAM: CT HEAD WITHOUT CONTRAST CT CERVICAL SPINE WITHOUT CONTRAST TECHNIQUE: Multidetector CT imaging of the head and cervical spine was performed following the standard protocol without intravenous contrast. Multiplanar CT image reconstructions of the cervical spine were also generated. RADIATION DOSE REDUCTION: This exam was performed according to the departmental dose-optimization program which includes automated exposure control, adjustment of the mA and/or kV according to patient size and/or use of iterative reconstruction technique. COMPARISON:  None. FINDINGS: CT HEAD FINDINGS Brain: No age advanced or lobar predominant parenchymal atrophy. Mild-to-moderate patchy and ill-defined hypoattenuation within the cerebral white matter. There are a few small nonspecific calcifications scattered within the bilateral cerebral hemispheres. There is no acute intracranial  hemorrhage. No demarcated cortical infarct. No extra-axial fluid collection. No evidence of an intracranial mass. No midline shift. Vascular: No hyperdense vessel.  Atherosclerotic calcifications. Skull: No calvarial fracture or aggressive osseous lesion. Sinuses/Orbits: No mass or acute finding within the imaged orbits. No significant paranasal sinus disease at the imaged levels. CT CERVICAL SPINE FINDINGS Alignment: Straightening of  the expected cervical lordosis. No significant spondylolisthesis. Skull base and vertebrae: The basion-dental and atlanto-dental intervals are maintained.No evidence of acute fracture to the cervical spine. Soft tissues and spinal canal: No prevertebral fluid or swelling. No visible canal hematoma. Disc levels: Cervical spondylosis with multilevel disc space narrowing, disc bulges/central disc protrusions, posterior disc osteophyte complexes and uncovertebral hypertrophy. Disc space narrowing is greatest at C4-C5 and C5-C6 (advanced at these levels). Multilevel spinal canal stenosis. Most notably at C5-C6, a posterior disc osteophyte complex contributes to spinal canal stenosis which appears moderate in severity. Multilevel bony neural foraminal narrowing. Upper chest: No consolidation within the imaged lung apices. No visible pneumothorax. IMPRESSION: CT head: 1. No evidence of an acute intracranial abnormality. 2. Mild-to-moderate, nonspecific cerebral white matter disease. CT cervical spine: 1. No evidence of an acute cervical spine fracture. 2. Nonspecific straightening of the expected cervical lordosis. 3. Cervical spondylosis as described. Electronically Signed   By: Jackey Loge D.O.   On: 02/07/2023 08:17      Subjective:  No significant events overnight, she tolerated BiPAP. Discharge Exam: Vitals:   03/05/23 2340 03/06/23 0338  BP: 130/73 133/71  Pulse: 88 72  Resp: 19 19  Temp: 97.8 F (36.6 C) 97.9 F (36.6 C)  SpO2: 93% 93%   Vitals:   03/05/23 1635  03/05/23 1940 03/05/23 2340 03/06/23 0338  BP: 124/65 138/66 130/73 133/71  Pulse: 88 83 88 72  Resp: 20 18 19 19   Temp: 98.3 F (36.8 C) 98.5 F (36.9 C) 97.8 F (36.6 C) 97.9 F (36.6 C)  TempSrc: Oral Oral Axillary Oral  SpO2: 91% 94% 93% 93%  Weight:      Height:        General: Pt is alert, awake, not in acute distress Cardiovascular: RRR, S1/S2 +, no rubs, no gallops Respiratory: CTA bilaterally, no wheezing, no rhonchi Abdominal: Soft, NT, ND, bowel sounds + Extremities: no edema, no cyanosis    The results of significant diagnostics from this hospitalization (including imaging, microbiology, ancillary and laboratory) are listed below for reference.     Microbiology: Recent Results (from the past 240 hour(s))  Resp panel by RT-PCR (RSV, Flu A&B, Covid) Anterior Nasal Swab     Status: None   Collection Time: 02/25/23  2:19 PM   Specimen: Anterior Nasal Swab  Result Value Ref Range Status   SARS Coronavirus 2 by RT PCR NEGATIVE NEGATIVE Final    Comment: (NOTE) SARS-CoV-2 target nucleic acids are NOT DETECTED.  The SARS-CoV-2 RNA is generally detectable in upper respiratory specimens during the acute phase of infection. The lowest concentration of SARS-CoV-2 viral copies this assay can detect is 138 copies/mL. A negative result does not preclude SARS-Cov-2 infection and should not be used as the sole basis for treatment or other patient management decisions. A negative result may occur with  improper specimen collection/handling, submission of specimen other than nasopharyngeal swab, presence of viral mutation(s) within the areas targeted by this assay, and inadequate number of viral copies(<138 copies/mL). A negative result must be combined with clinical observations, patient history, and epidemiological information. The expected result is Negative.  Fact Sheet for Patients:  BloggerCourse.com  Fact Sheet for Healthcare Providers:   SeriousBroker.it  This test is no t yet approved or cleared by the [place] FDA and  has been authorized for detection and/or diagnosis of SARS-CoV-2 by FDA under an Emergency Use Authorization (EUA). This EUA will remain  in effect (meaning this test can be used) for  the duration of the COVID-19 declaration under Section 564(b)(1) of the Act, 21 U.S.C.section 360bbb-3(b)(1), unless the authorization is terminated  or revoked sooner.       Influenza A by PCR NEGATIVE NEGATIVE Final   Influenza B by PCR NEGATIVE NEGATIVE Final    Comment: (NOTE) The Xpert Xpress SARS-CoV-2/FLU/RSV plus assay is intended as an aid in the diagnosis of influenza from Nasopharyngeal swab specimens and should not be used as a sole basis for treatment. Nasal washings and aspirates are unacceptable for Xpert Xpress SARS-CoV-2/FLU/RSV testing.  Fact Sheet for Patients: BloggerCourse.com  Fact Sheet for Healthcare Providers: SeriousBroker.it  This test is not yet approved or cleared by the [place] FDA and has been authorized for detection and/or diagnosis of SARS-CoV-2 by FDA under an Emergency Use Authorization (EUA). This EUA will remain in effect (meaning this test can be used) for the duration of the COVID-19 declaration under Section 564(b)(1) of the Act, 21 U.S.C. section 360bbb-3(b)(1), unless the authorization is terminated or revoked.     Resp Syncytial Virus by PCR NEGATIVE NEGATIVE Final    Comment: (NOTE) Fact Sheet for Patients: BloggerCourse.com  Fact Sheet for Healthcare Providers: SeriousBroker.it  This test is not yet approved or cleared by the [place] FDA and has been authorized for detection and/or diagnosis of SARS-CoV-2 by FDA under an Emergency Use Authorization (EUA). This EUA will remain in effect (meaning this test can be used) for  the duration of the COVID-19 declaration under Section 564(b)(1) of the Act, 21 U.S.C. section 360bbb-3(b)(1), unless the authorization is terminated or revoked.  Performed at [facility], [address], [place], [place] [address]      Labs: BNP (last 3 results) Recent Labs    02/27/23 0751 02/28/23 0448 03/01/23 0328  BNP 138.3* 63.4 43.2   Basic Metabolic Panel: Recent Labs  Lab 02/28/23 0448 03/01/23 0328 03/02/23 0354 03/03/23 0538 03/04/23 0629 03/05/23 0440  NA 136 132* 136 136 136 137  K 3.5 3.7 3.5 3.7 3.9 3.9  CL 95* 93* 98 99 99 101  CO2 31 30 31 29 29 31   GLUCOSE 138* 133* 126* 124* 114* 110*  BUN 31* 32* 32* 34* 43* 45*  CREATININE 1.33* 1.19* 1.10* 1.10* 1.07* 1.14*  CALCIUM 10.2 9.8 9.6 9.7 10.2 10.1  MG 2.2 2.3 2.3 2.2  --   --   PHOS 3.7 3.8 3.6 3.0  --   --    Liver Function Tests: No results for input(s): "AST", "ALT", "ALKPHOS", "BILITOT", "PROT", "ALBUMIN" in the last 168 hours. No results for input(s): "LIPASE", "AMYLASE" in the last 168 hours. No results for input(s): "AMMONIA" in the last 168 hours. CBC: Recent Labs  Lab 02/28/23 0448 03/01/23 0328 03/02/23 0354 03/03/23 0538 03/04/23 0629  WBC 12.2* 12.7* 14.1* 15.1* 17.1*  NEUTROABS 9.7* 9.6* 11.8* 12.2* 13.5*  HGB 10.2* 9.8* 10.0* 9.8* 10.2*  HCT 33.8* 32.7* 34.1* 32.8* 33.3*  MCV 95.2 95.1 94.7 94.5 95.1  PLT 297 274 281 244 264   Cardiac Enzymes: No results for input(s): "CKTOTAL", "CKMB", "CKMBINDEX", "TROPONINI" in the last 168 hours. BNP: Invalid input(s): "POCBNP" CBG: Recent Labs  Lab 02/27/23 2033  GLUCAP 154*   D-Dimer No results for input(s): "DDIMER" in the last 72 hours. Hgb A1c No results for input(s): "HGBA1C" in the last 72 hours. Lipid Profile No results for input(s): "CHOL", "HDL", "LDLCALC", "TRIG", "CHOLHDL", "LDLDIRECT" in the last 72 hours. Thyroid function studies No results for input(s): "TSH", "  T4TOTAL", "T3FREE",  "THYROIDAB" in the last 72 hours.  Invalid input(s): "FREET3" Anemia work up No results for input(s): "VITAMINB12", "FOLATE", "FERRITIN", "TIBC", "IRON", "RETICCTPCT" in the last 72 hours. Urinalysis    Component Value Date/Time   COLORURINE YELLOW 01/20/2022 1445   APPEARANCEUR CLEAR 01/20/2022 1445   LABSPEC 1.015 01/20/2022 1445   PHURINE 5.0 01/20/2022 1445   GLUCOSEU NEGATIVE 01/20/2022 1445   HGBUR MODERATE (A) 01/20/2022 1445   BILIRUBINUR NEGATIVE 01/20/2022 1445   KETONESUR NEGATIVE 01/20/2022 1445   PROTEINUR NEGATIVE 01/20/2022 1445   NITRITE NEGATIVE 01/20/2022 1445   LEUKOCYTESUR NEGATIVE 01/20/2022 1445   Sepsis Labs Recent Labs  Lab 03/01/23 0328 03/02/23 0354 03/03/23 0538 03/04/23 0629  WBC 12.7* 14.1* 15.1* 17.1*   Microbiology Recent Results (from the past 240 hour(s))  Resp panel by RT-PCR (RSV, Flu A&B, Covid) Anterior Nasal Swab     Status: None   Collection Time: 02/25/23  2:19 PM   Specimen: Anterior Nasal Swab  Result Value Ref Range Status   SARS Coronavirus 2 by RT PCR NEGATIVE NEGATIVE Final    Comment: (NOTE) SARS-CoV-2 target nucleic acids are NOT DETECTED.  The SARS-CoV-2 RNA is generally detectable in upper respiratory specimens during the acute phase of infection. The lowest concentration of SARS-CoV-2 viral copies this assay can detect is 138 copies/mL. A negative result does not preclude SARS-Cov-2 infection and should not be used as the sole basis for treatment or other patient management decisions. A negative result may occur with  improper specimen collection/handling, submission of specimen other than nasopharyngeal swab, presence of viral mutation(s) within the areas targeted by this assay, and inadequate number of viral copies(<138 copies/mL). A negative result must be combined with clinical observations, patient history, and epidemiological information. The expected result is Negative.  Fact Sheet for Patients:   BloggerCourse.com  Fact Sheet for Healthcare Providers:  SeriousBroker.it  This test is no t yet approved or cleared by the [place] FDA and  has been authorized for detection and/or diagnosis of SARS-CoV-2 by FDA under an Emergency Use Authorization (EUA). This EUA will remain  in effect (meaning this test can be used) for the duration of the COVID-19 declaration under Section 564(b)(1) of the Act, 21 U.S.C.section 360bbb-3(b)(1), unless the authorization is terminated  or revoked sooner.       Influenza A by PCR NEGATIVE NEGATIVE Final   Influenza B by PCR NEGATIVE NEGATIVE Final    Comment: (NOTE) The Xpert Xpress SARS-CoV-2/FLU/RSV plus assay is intended as an aid in the diagnosis of influenza from Nasopharyngeal swab specimens and should not be used as a sole basis for treatment. Nasal washings and aspirates are unacceptable for Xpert Xpress SARS-CoV-2/FLU/RSV testing.  Fact Sheet for Patients: BloggerCourse.com  Fact Sheet for Healthcare Providers: SeriousBroker.it  This test is not yet approved or cleared by the [place] FDA and has been authorized for detection and/or diagnosis of SARS-CoV-2 by FDA under an Emergency Use Authorization (EUA). This EUA will remain in effect (meaning this test can be used) for the duration of the COVID-19 declaration under Section 564(b)(1) of the Act, 21 U.S.C. section 360bbb-3(b)(1), unless the authorization is terminated or revoked.     Resp Syncytial Virus by PCR NEGATIVE NEGATIVE Final    Comment: (NOTE) Fact Sheet for Patients: BloggerCourse.com  Fact Sheet for Healthcare Providers: SeriousBroker.it  This test is not yet approved or cleared by the [place] FDA and has been authorized for detection and/or diagnosis of SARS-CoV-2 by FDA  under an Emergency Use  Authorization (EUA). This EUA will remain in effect (meaning this test can be used) for the duration of the COVID-19 declaration under Section 564(b)(1) of the Act, 21 U.S.C. section 360bbb-3(b)(1), unless the authorization is terminated or revoked.  Performed at [facility], [address] [place], [place] [address]      Time coordinating discharge: Over 30 minutes  SIGNED:   Huey Bienenstock, MD  [facility] 03/06/2023, 10:54 AM Pager   If 7PM-7AM, please contact night-coverage [email or web address] Password [email or web address]

## 2023-03-06 NOTE — Progress Notes (Signed)
Delivered patients TOC medication to her room.

## 2023-03-06 NOTE — Progress Notes (Signed)
   03/06/23 0005  BiPAP/CPAP/SIPAP  BiPAP/CPAP/SIPAP Pt Type Adult  BiPAP/CPAP/SIPAP Resmed  Mask Type Full face mask  Mask Size Large  Respiratory Rate 14 breaths/min  IPAP 14 cmH20  EPAP 6 cmH2O  Pressure Support 8 cmH20  Flow Rate 3 lpm (4L bled in)  Patient Home Equipment No

## 2023-03-06 NOTE — Telephone Encounter (Signed)
Noted  

## 2023-03-06 NOTE — Progress Notes (Signed)
Discharge instructions (including medications) discussed with and copy provided to patient/caregiver Patient will be transported by [facility].

## 2023-03-06 NOTE — Telephone Encounter (Signed)
Sent mychart message to patient

## 2023-03-06 NOTE — Progress Notes (Signed)
PROGRESS NOTE                                                                                                                                                                                                             Patient Demographics:    Janice Ryan, is a 67 y.o. female, DOB - 1955/04/24, NGE:[id number]  Outpatient Primary MD for the patient is [name], NP    LOS - 8  Admit date - 02/25/2023    Chief Complaint  Patient presents with   Shortness of Breath       Brief Narrative (HPI from H&P)     67 year old female with history of asthma, OSA/OHS uses BiPAP at night, chronic hypoxic hypercapnic respiratory failure on supplemental baseline O2 of 5 L nasal cannula, allergic rhinitis, chronic right knee pain presented to the ED from SNF due to shortness of breath. Patient also noted to have presented from rehab facility due to worsening right knee pain after not receiving medications on day of admission with worsening shortness of breath and increased work of breathing. Per admitting physician on EMS arrival patient in respiratory distress, given a breathing treatment and had to be placed on the BiPAP in the ED with improved work of bleeding and improvement of repeated VBG. Influenza A, B, SARS coronavirus 2, RSV was negative. CT angiogram chest negative for PE or infiltrate. Patient admitted, placed on BiPAP, IV steroids, nebulizer treatments.    Subjective:    [name] significant events overnight, she denies any complaints today, she has been tolerating BiPAP nightly   Assessment  & Plan :    #1 Acute on chronic respiratory failure with hypoxia and hypercapnia - secondary to acute asthma exacerbation, OHS/OSA along with acute on chronic diastolic CHF EF 60% or above on last echocardiogram 1 year ago.  On 5 to 6 L nasal cannula oxygen at home at baseline. - Patient presenting with worsening  shortness of breath, hypoxia and hypercapnia, increased work of breathing with increased O2 requirement from baseline home O2.  Had diffuse wheezing and elevated BNP at the time of admission along with evidence of hypercapnia and hypoxia. -Initially on IV Lasix, he is euvolemic currently, we will DC diuresis for now -Steroids initially, she has been weaned off -Will DC Diamox . -Patient with obesity hypoventilatory syndrome,  -She is  with evidence of CO2 retention, case management trying to arrange for home BiPAP versus trilogy -She was encouraged with incentive spirometry and flutter valve -Continue with BiPAP nightly  Hypokalemia -Repleted.   OSA/OHS -It is noted per admitting physician that patient stated he used CPAP once within the last week at the rehab facility.  Patient placed on BiPAP nightly here and feeling better, she will need BIPAP or trilogy on discharge.    Depression -Continue home regimen Elavil, BuSpar, Cymbalta, Remeron.   Morbid obesity -BMI 63.40 kg/m -Lifestyle modification -Outpatient follow-up.  Generalized weakness-deconditioning -For SNF placement, awaiting bed availability      Condition - Fair  Family Communication  :  None present  Code Status :  Full  Consults  :  None  PUD Prophylaxis : PPI   Procedures  :      CTA -  1. No embolism noted to the segmental pulmonary artery level, as described above. 2. No lung mass, consolidation, pleural effusion or pneumothorax.   TTE 01/2022 -    1. Suboptimal echocardiographic images   2. Left ventricular ejection fraction, by estimation, is >75%. The left ventricle has hyperdynamic function. The left ventricle has no regional wall motion abnormalities. Left ventricular diastolic function could not be evaluated.   3. The aortic valve was not well visualized. Aortic valve regurgitation is not visualized. Mild aortic valve stenosis.       Disposition Plan  :    Status is: Inpatient   DVT  Prophylaxis  :  Lovenox  SCDs Start: 02/25/23 2308    Lab Results  Component Value Date   PLT 264 03/04/2023    Diet :  Diet Order             Diet - low sodium heart healthy           Diet Heart Room service appropriate? Yes; Fluid consistency: Thin  Diet effective now                    Inpatient Medications  Scheduled Meds:  amitriptyline  25 mg Oral QHS   budesonide (PULMICORT) nebulizer solution  0.5 mg Nebulization BID   busPIRone  10 mg Oral TID   dextromethorphan-guaiFENesin  2 tablet Oral BID   doxepin  50 mg Oral Daily   DULoxetine  120 mg Oral Daily   enoxaparin (LOVENOX) injection  90 mg Subcutaneous Q24H   fluticasone  2 spray Each Nare Daily   ipratropium-albuterol  3 mL Nebulization BID   mirtazapine  15 mg Oral QHS   montelukast  10 mg Oral QHS   pantoprazole  40 mg Oral Daily   Continuous Infusions: PRN Meds:.acetaminophen **OR** acetaminophen, HYDROcodone-acetaminophen, ipratropium-albuterol, Muscle Rub, ondansetron **OR** ondansetron (ZOFRAN) IV    Objective:   Vitals:   03/05/23 1635 03/05/23 1940 03/05/23 2340 03/06/23 0338  BP: 124/65 138/66 130/73 133/71  Pulse: 88 83 88 72  Resp: 20 18 19 19   Temp: 98.3 F (36.8 C) 98.5 F (36.9 C) 97.8 F (36.6 C) 97.9 F (36.6 C)  TempSrc: Oral Oral Axillary Oral  SpO2: 91% 94% 93% 93%  Weight:      Height:        Wt Readings from Last 3 Encounters:  02/25/23 (!) 189.1 kg  02/08/23 (!) 193 kg  01/29/22 (!) 202.3 kg     Intake/Output Summary (Last 24 hours) at 03/06/2023 1016 Last data filed at 03/05/2023 2346 Gross per 24 hour  Intake --  Output 1200 ml  Net -1200 ml     Physical Exam  Awake Alert, Oriented X 3, No new F.N deficits, Normal affect Symmetrical Chest wall movement, Good air movement bilaterally, CTAB RRR,No Gallops,Rubs or new Murmurs, No Parasternal Heave +ve B.Sounds, Abd Soft, No tenderness, No rebound - guarding or rigidity. No Cyanosis, Clubbing or edema,  No new Rash or bruise        Data Review:    Recent Labs  Lab 02/28/23 0448 03/01/23 0328 03/02/23 0354 03/03/23 0538 03/04/23 0629  WBC 12.2* 12.7* 14.1* 15.1* 17.1*  HGB 10.2* 9.8* 10.0* 9.8* 10.2*  HCT 33.8* 32.7* 34.1* 32.8* 33.3*  PLT 297 274 281 244 264  MCV 95.2 95.1 94.7 94.5 95.1  MCH 28.7 28.5 27.8 28.2 29.1  MCHC 30.2 30.0 29.3* 29.9* 30.6  RDW 12.4 12.3 12.2 12.2 12.2  LYMPHSABS 1.2 1.6 1.0 1.5 1.7  MONOABS 1.1* 1.4* 1.1* 1.2* 1.5*  EOSABS 0.0 0.0 0.0 0.0 0.0  BASOSABS 0.0 0.0 0.0 0.0 0.0    Recent Labs  Lab 02/28/23 0448 03/01/23 0328 03/02/23 0354 03/03/23 0538 03/04/23 0629 03/05/23 0440  NA 136 132* 136 136 136 137  K 3.5 3.7 3.5 3.7 3.9 3.9  CL 95* 93* 98 99 99 101  CO2 31 30 31 29 29 31   ANIONGAP 10 9 7 8 8 5   GLUCOSE 138* 133* 126* 124* 114* 110*  BUN 31* 32* 32* 34* 43* 45*  CREATININE 1.33* 1.19* 1.10* 1.10* 1.07* 1.14*  CRP 0.8 0.8  --   --   --   --   PROCALCITON <0.10 <0.10  --   --   --   --   BNP 63.4 43.2  --   --   --   --   MG 2.2 2.3 2.3 2.2  --   --   CALCIUM 10.2 9.8 9.6 9.7 10.2 10.1      Recent Labs  Lab 02/28/23 0448 03/01/23 0328 03/02/23 0354 03/03/23 0538 03/04/23 0629 03/05/23 0440  CRP 0.8 0.8  --   --   --   --   PROCALCITON <0.10 <0.10  --   --   --   --   BNP 63.4 43.2  --   --   --   --   MG 2.2 2.3 2.3 2.2  --   --   CALCIUM 10.2 9.8 9.6 9.7 10.2 10.1    Radiology Reports No results found.    Signature  -   Huey Bienenstock M.D on 03/06/2023 at 10:16 AM   -  To page go to [email or web address]

## 2023-03-07 ENCOUNTER — Telehealth: Payer: Self-pay | Admitting: Family

## 2023-03-07 ENCOUNTER — Other Ambulatory Visit: Payer: Self-pay | Admitting: Family

## 2023-03-07 ENCOUNTER — Telehealth: Payer: Self-pay

## 2023-03-07 DIAGNOSIS — I10 Essential (primary) hypertension: Secondary | ICD-10-CM

## 2023-03-07 NOTE — Telephone Encounter (Signed)
Schedule appointment with first available provider.

## 2023-03-07 NOTE — Transitions of Care (Post Inpatient/ED Visit) (Signed)
   03/07/2023  Name: Janice Ryan MRN: [id number] DOB: 21-Mar-1956  Today's TOC FU Call Status: Today's TOC FU Call Status:: Unsuccessful Call (1st Attempt) Unsuccessful Call (1st Attempt) Date: 03/07/23  Attempted to reach the patient regarding the most recent Inpatient/ED visit.  Follow Up Plan: Additional outreach attempts will be made to reach the patient to complete the Transitions of Care (Post Inpatient/ED visit) call.   Signature Karena Addison, LPN [facility] Nurse Health Advisor Direct Dial [phone]

## 2023-03-07 NOTE — Telephone Encounter (Signed)
   Medication Refill -  Most Recent Primary Care Visit:  Provider: Ricky Stabs J  Department: [facility]  Visit Type: MYCHART VIDEO VISIT  Date: 02/20/2023  Medication: Metformin (medication was prescribed in the hospital)   Has the patient contacted their pharmacy? No   Is this the correct pharmacy for this prescription? Yes If no, delete pharmacy and type the correct one.  This is the patient's preferred pharmacy:  [id number] [place], [place] - [address] Phone: [phone]  Fax: [phone]       Has the prescription been filled recently? Yes  Is the patient out of the medication? Yes  Has the patient been seen for an appointment in the last year OR does the patient have an upcoming appointment? Yes  Can we respond through MyChart? No   Agent: Please be advised that Rx refills may take up to 3 business days. We ask that you follow-up with your pharmacy.

## 2023-03-07 NOTE — Telephone Encounter (Signed)
Patient will need new Rx for hydrocodone if provider prescribes- it is not listed on current medications and last given as in patient at hospital.

## 2023-03-07 NOTE — Telephone Encounter (Signed)
*  Patient was recently discharged from the hospital and states she would like PCP to order a bedside commode.   *Also advised the patient that the LPN called regarding transitional care call, unable to connect.  *PCP has no available appointments within 7-14 days please call patient to schedule a hospital follow up appointment.

## 2023-03-07 NOTE — Telephone Encounter (Signed)
Medication Refill -  Most Recent Primary Care Visit:  Provider: Ricky Stabs J  Department: [facility]  Visit Type: MYCHART VIDEO VISIT  Date: 02/20/2023  Medication: HYDROcodone-acetaminophen (NORCO/VICODIN), lisinopril (ZESTRIL) and traMADol (ULTRAM) 50 MG tablet   Has the patient contacted their pharmacy? No  Is this the correct pharmacy for this prescription? Yes  This is the patient's preferred pharmacy: [id number] [place], [place] - [address]  Phone: [phone] Fax: [phone]  Has the prescription been filled recently? No  Is the patient out of the medication? Yes  Has the patient been seen for an appointment in the last year OR does the patient have an upcoming appointment? Yes  Can we respond through MyChart? No  Agent: Please be advised that Rx refills may take up to 3 business days. We ask that you follow-up with your pharmacy.  HYDROcodone-acetaminophen (NORCO/VICODIN), lisinopril (ZESTRIL) are not on her medication list.

## 2023-03-08 ENCOUNTER — Encounter: Payer: Self-pay | Admitting: Pulmonary Disease

## 2023-03-08 NOTE — Transitions of Care (Post Inpatient/ED Visit) (Signed)
   03/08/2023  Name: Janice Ryan MRN: [id number] DOB: [birth year]  Today's TOC FU Call Status: Today's TOC FU Call Status:: Unsuccessful Call (2nd Attempt) Unsuccessful Call (1st Attempt) Date: 03/07/23 Unsuccessful Call (2nd Attempt) Date: 03/08/23  Attempted to reach the patient regarding the most recent Inpatient/ED visit.  Follow Up Plan: Additional outreach attempts will be made to reach the patient to complete the Transitions of Care (Post Inpatient/ED visit) call.   Signature Karena Addison, LPN [facility] Nurse Health Advisor Direct Dial [phone]

## 2023-03-08 NOTE — Telephone Encounter (Signed)
Metformin documented as discontinue on 02/13/2023 and 02/14/2023 by Rhetta Mura, MD.

## 2023-03-09 ENCOUNTER — Ambulatory Visit: Payer: Self-pay

## 2023-03-09 ENCOUNTER — Telehealth: Payer: Self-pay | Admitting: Family

## 2023-03-09 ENCOUNTER — Other Ambulatory Visit: Payer: Self-pay | Admitting: Family

## 2023-03-09 ENCOUNTER — Inpatient Hospital Stay: Payer: 59 | Admitting: Family

## 2023-03-09 DIAGNOSIS — G8929 Other chronic pain: Secondary | ICD-10-CM

## 2023-03-09 MED ORDER — TRAMADOL HCL 50 MG PO TABS
50.0000 mg | ORAL_TABLET | Freq: Two times a day (BID) | ORAL | 0 refills | Status: DC | PRN
Start: 2023-03-09 — End: 2023-05-04

## 2023-03-09 NOTE — Telephone Encounter (Signed)
-   Tramadol prescribed.  - Please note Lisinopril discontinued on 02/16/2023 by Alba Cory, MD

## 2023-03-09 NOTE — Telephone Encounter (Signed)
Called patient to reschedule follow up appointment

## 2023-03-09 NOTE — Telephone Encounter (Addendum)
     Chief Complaint: Came home from hospital Monday."I have fallen twice since being home, my legs are weak from being in bed." "I can't come in today." Asking to be worked in Monday. Practice currently closed for lunch. Symptoms: Above Frequency: This week. "I'm not going to ED." Pertinent Negatives: Patient denies any other symptoms. Disposition: [] ED /[] Urgent Care (no appt availability in office) / [] Appointment(In office/virtual)/ []  [facility]/ [] Home Care/ [x] Refused Recommended Disposition /[] [facility]/ []  Follow-up with PCP Additional Notes: Please advise pt.  Reason for Disposition  [1] MODERATE weakness (i.e., interferes with work, school, normal activities) AND [2] cause unknown  (Exceptions: Weakness from acute minor illness or poor fluid intake; weakness is chronic and not worse.)  Answer Assessment - Initial Assessment Questions 1. DESCRIPTION: "Describe how you are feeling."     My legs are weak, I have fallen twice since I've been home from the hospital. 2. SEVERITY: "How bad is it?"  "Can you stand and walk?"   - MILD (0-3): Feels weak or tired, but does not interfere with work, school or normal activities.   - MODERATE (4-7): Able to stand and walk; weakness interferes with work, school, or normal activities.   - SEVERE (8-10): Unable to stand or walk; unable to do usual activities.     Moderate 3. ONSET: "When did these symptoms begin?" (e.g., hours, days, weeks, months)     "In the hospital." 4. CAUSE: "What do you think is causing the weakness or fatigue?" (e.g., not drinking enough fluids, medical problem, trouble sleeping)     Unsure 5. NEW MEDICINES:  "Have you started on any new medicines recently?" (e.g., opioid pain medicines, benzodiazepines, muscle relaxants, antidepressants, antihistamines, neuroleptics, beta blockers)     No 6. OTHER SYMPTOMS: "Do you have any other symptoms?" (e.g., chest pain, fever, cough, SOB, vomiting,  diarrhea, bleeding, other areas of pain)     No 7. PREGNANCY: "Is there any chance you are pregnant?" "When was your last menstrual period?"     No  Protocols used: Weakness (Generalized) and Fatigue-A-AH

## 2023-03-09 NOTE — Telephone Encounter (Signed)
She has appointment this afternoon with amy .

## 2023-03-09 NOTE — Telephone Encounter (Signed)
Requested medications are due for refill today.  Unsure  Requested medications are on the active medications list.  Tramadol is, not lisinopril  Last refill. Tramadol 02/15/2023 #10 0 rf, Lisinopril 12/05/2022 #90 0 rf  Future visit scheduled.   yes  Notes to clinic.  Tramadol is not delegated. Lisinopril was d/c'd. 02/16/2023    Requested Prescriptions  Pending Prescriptions Disp Refills   traMADol (ULTRAM) 50 MG tablet 10 tablet 0    Sig: Take 1 tablet (50 mg total) by mouth every 12 (twelve) hours as needed for severe pain (pain score 7-10).     Not Delegated - Analgesics:  Opioid Agonists Failed - 03/07/2023 11:23 AM      Failed - This refill cannot be delegated      Failed - Urine Drug Screen completed in last 360 days      Passed - Valid encounter within last 3 months    Recent Outpatient Visits           2 weeks ago Medication refill   [place] [facility], [name] J, NP   5 months ago Asthma, unspecified asthma severity, unspecified whether complicated, unspecified whether persistent   [place] [facility], [name] J, NP   1 year ago Chronic frontal sinusitis   [place] [facility], [name] J, NP   1 year ago Chronic frontal sinusitis   [place] [facility], [name] J, NP   1 year ago Acute sinusitis, recurrence not specified, unspecified location   [facility], [place], [place]       Future Appointments             Today [name], NP [place] [facility]             lisinopril (ZESTRIL) 20 MG tablet 90 tablet 0    Sig: Take 1 tablet (20 mg total) by mouth daily.     Cardiovascular:  ACE Inhibitors Failed - 03/07/2023 11:23 AM      Failed - Cr in normal range and within 180 days    Creatinine, Ser  Date Value Ref Range Status  03/05/2023 1.14 (H) 0.44 - 1.00 mg/dL Final         Passed  - K in normal range and within 180 days    Potassium  Date Value Ref Range Status  03/05/2023 3.9 3.5 - 5.1 mmol/L Final         Passed - Patient is not pregnant      Passed - Last BP in normal range    BP Readings from Last 1 Encounters:  03/06/23 133/71         Passed - Valid encounter within last 6 months    Recent Outpatient Visits           2 weeks ago Medication refill   [place] [facility], [name] J, NP   5 months ago Asthma, unspecified asthma severity, unspecified whether complicated, unspecified whether persistent   [place] [facility], [name] J, NP   1 year ago Chronic frontal sinusitis   [place] [facility], [place], NP   1 year ago Chronic frontal sinusitis    Primary Care at [facility], [place], NP   1 year ago Acute sinusitis,  recurrence not specified, unspecified location   [facility] at [facility], [place], [place]       Future Appointments             Today [name], NP [facility] at [facility]

## 2023-03-09 NOTE — Telephone Encounter (Signed)
Report to Emergency Department/call 911 for immediate medical evaluation. Follow-up with Primary Care.

## 2023-03-12 NOTE — Telephone Encounter (Signed)
-   Tramadol prescribed 03/09/2023.  - Please note Lisinopril discontinued on 02/16/2023 by Alba Cory, MD

## 2023-03-13 NOTE — Transitions of Care (Post Inpatient/ED Visit) (Signed)
   03/13/2023  Name: Gladene Isham MRN: [id number] DOB: 08/10/1955  Today's TOC FU Call Status: Today's TOC FU Call Status:: Unsuccessful Call (2nd Attempt) Unsuccessful Call (1st Attempt) Date: 03/07/23 Unsuccessful Call (2nd Attempt) Date: 03/08/23  Attempted to reach the patient regarding the most recent Inpatient/ED visit.  Follow Up Plan: No further outreach attempts will be made at this time. We have been unable to contact the patient. Patient already seen in office Signature Karena Addison, LPN [facility] Nurse Health Advisor Direct Dial [phone]

## 2023-03-20 ENCOUNTER — Encounter: Payer: 59 | Admitting: Family

## 2023-03-20 ENCOUNTER — Telehealth: Payer: Self-pay | Admitting: Family

## 2023-03-20 NOTE — Progress Notes (Signed)
Erroneous encounter-disregard

## 2023-03-20 NOTE — Telephone Encounter (Signed)
Copied from CRM [id number]. Topic: General - Inquiry >> Mar 20, 2023 11:58 AM Patsy Lager T wrote: Reason for CRM: patient said she can not come today for her HFU but no there are no appts until Feb. Patient is asking for a call back to schedule sooner

## 2023-04-02 ENCOUNTER — Ambulatory Visit (HOSPITAL_BASED_OUTPATIENT_CLINIC_OR_DEPARTMENT_OTHER): Payer: 59 | Admitting: Student

## 2023-04-06 ENCOUNTER — Other Ambulatory Visit: Payer: Self-pay | Admitting: Family

## 2023-04-06 DIAGNOSIS — G8929 Other chronic pain: Secondary | ICD-10-CM

## 2023-04-09 ENCOUNTER — Inpatient Hospital Stay: Payer: 59 | Admitting: Physician Assistant

## 2023-04-09 ENCOUNTER — Telehealth: Payer: Self-pay | Admitting: Family

## 2023-04-09 NOTE — Telephone Encounter (Signed)
 Copied from CRM [id number]. Topic: Appointment Scheduling - Scheduling Inquiry for Clinic >> Apr 09, 2023  1:33 PM Franchot Heidelberg wrote: Reason for CRM: Pt needs to reschedule her Hosp fu.   Best contact: [phone]

## 2023-04-09 NOTE — Telephone Encounter (Signed)
 Called pt and left vm to call office back to reschedule HFU. Next available with provider is at the end of the month.

## 2023-04-09 NOTE — Telephone Encounter (Signed)
 Schedule appointment?

## 2023-04-10 NOTE — Telephone Encounter (Signed)
 Schedule appointment?

## 2023-04-13 ENCOUNTER — Inpatient Hospital Stay: Payer: 59 | Admitting: Family

## 2023-04-16 ENCOUNTER — Other Ambulatory Visit: Payer: Self-pay | Admitting: Family

## 2023-04-16 DIAGNOSIS — G8929 Other chronic pain: Secondary | ICD-10-CM

## 2023-04-16 NOTE — Telephone Encounter (Signed)
 Schedule appointment?

## 2023-05-01 ENCOUNTER — Other Ambulatory Visit: Payer: Self-pay | Admitting: Family

## 2023-05-01 ENCOUNTER — Telehealth: Payer: Self-pay | Admitting: Family

## 2023-05-01 DIAGNOSIS — G8929 Other chronic pain: Secondary | ICD-10-CM

## 2023-05-01 NOTE — Telephone Encounter (Signed)
Copied from CRM [id number]. Topic: Clinical - Medication Refill >> May 01, 2023 12:07 PM Patsy Lager T wrote: Most Recent Primary Care Visit:  Provider: [name]  [facility]: [facility]  Visit Type: MYCHART VIDEO VISIT  Date: 02/20/2023  Medication: traMADol (ULTRAM) 50 MG tablet  Has the patient contacted their pharmacy? No  Is this the correct pharmacy for this prescription? Yes   This is the patient's preferred pharmacy:  [id number] [place], [place] - [address] [place] [address] Phone: [phone] Fax: [phone]  Has the prescription been filled recently? No  Is the patient out of the medication? Yes  Has the patient been seen for an appointment in the last year OR does the patient have an upcoming appointment? Yes  Can we respond through MyChart? Yes  Agent: Please be advised that Rx refills may take up to 3 business days. We ask that you follow-up with your pharmacy.

## 2023-05-01 NOTE — Telephone Encounter (Signed)
Copied from CRM [id number]. Topic: Clinical - Medication Refill >> May 01, 2023 12:23 PM Patsy Lager T wrote: Most Recent Primary Care Visit:  Provider: Ricky Stabs J  [facility]: [facility]  Visit Type: MYCHART VIDEO VISIT  Date: 02/20/2023  Medication: gabapentin (NEURONTIN) capsule 300 mg and lisinopril (ZESTRIL) 20 MG tablet  Has the patient contacted their pharmacy? No  Is this the correct pharmacy for this prescription? Yes   This is the patient's preferred pharmacy:  [id number] [place], [place] - [address] [place] [address] Phone: [phone] Fax: [phone]  Has the prescription been filled recently? No  Is the patient out of the medication? Yes  Has the patient been seen for an appointment in the last year OR does the patient have an upcoming appointment? Yes  Can we respond through MyChart? Yes  Agent: Please be advised that Rx refills may take up to 3 business days. We ask that you follow-up with your pharmacy.

## 2023-05-02 NOTE — Telephone Encounter (Signed)
Keep upcoming appointment.

## 2023-05-02 NOTE — Telephone Encounter (Signed)
Keep upcoming appointment. Discontinued on 02/09/2023 by Rhetta Mura, MD.

## 2023-05-03 ENCOUNTER — Encounter (INDEPENDENT_AMBULATORY_CARE_PROVIDER_SITE_OTHER): Payer: Self-pay

## 2023-05-03 NOTE — Telephone Encounter (Signed)
Keep upcoming appointment.

## 2023-05-04 ENCOUNTER — Telehealth (INDEPENDENT_AMBULATORY_CARE_PROVIDER_SITE_OTHER): Payer: 59 | Admitting: Family

## 2023-05-04 DIAGNOSIS — Z1231 Encounter for screening mammogram for malignant neoplasm of breast: Secondary | ICD-10-CM

## 2023-05-04 DIAGNOSIS — R0602 Shortness of breath: Secondary | ICD-10-CM

## 2023-05-04 DIAGNOSIS — J329 Chronic sinusitis, unspecified: Secondary | ICD-10-CM

## 2023-05-04 DIAGNOSIS — Z09 Encounter for follow-up examination after completed treatment for conditions other than malignant neoplasm: Secondary | ICD-10-CM

## 2023-05-04 DIAGNOSIS — G8929 Other chronic pain: Secondary | ICD-10-CM

## 2023-05-04 DIAGNOSIS — Z9981 Dependence on supplemental oxygen: Secondary | ICD-10-CM

## 2023-05-04 DIAGNOSIS — G4733 Obstructive sleep apnea (adult) (pediatric): Secondary | ICD-10-CM

## 2023-05-04 DIAGNOSIS — K859 Acute pancreatitis without necrosis or infection, unspecified: Secondary | ICD-10-CM

## 2023-05-04 DIAGNOSIS — M549 Dorsalgia, unspecified: Secondary | ICD-10-CM | POA: Diagnosis not present

## 2023-05-04 DIAGNOSIS — R635 Abnormal weight gain: Secondary | ICD-10-CM

## 2023-05-04 DIAGNOSIS — J45909 Unspecified asthma, uncomplicated: Secondary | ICD-10-CM

## 2023-05-04 DIAGNOSIS — M25569 Pain in unspecified knee: Secondary | ICD-10-CM

## 2023-05-04 DIAGNOSIS — E215 Disorder of parathyroid gland, unspecified: Secondary | ICD-10-CM

## 2023-05-04 DIAGNOSIS — N951 Menopausal and female climacteric states: Secondary | ICD-10-CM

## 2023-05-04 DIAGNOSIS — Z7689 Persons encountering health services in other specified circumstances: Secondary | ICD-10-CM

## 2023-05-04 DIAGNOSIS — F32A Depression, unspecified: Secondary | ICD-10-CM

## 2023-05-04 DIAGNOSIS — J9622 Acute and chronic respiratory failure with hypercapnia: Secondary | ICD-10-CM

## 2023-05-04 MED ORDER — GABAPENTIN 300 MG PO CAPS: 300.0000 mg | ORAL_CAPSULE | Freq: Every day | ORAL | 0 refills | Status: DC

## 2023-05-04 MED ORDER — TRAMADOL HCL 50 MG PO TABS
50.0000 mg | ORAL_TABLET | Freq: Two times a day (BID) | ORAL | 0 refills | Status: DC | PRN
Start: 2023-05-04 — End: 2023-07-11

## 2023-05-04 MED ORDER — AMOXICILLIN-POT CLAVULANATE 875-125 MG PO TABS: 1.0000 | ORAL_TABLET | Freq: Two times a day (BID) | ORAL | 0 refills | Status: DC

## 2023-05-04 NOTE — Telephone Encounter (Signed)
 Keep upcoming appointment. Discontinued on 02/09/2023 by Rhetta Mura, MD.

## 2023-05-04 NOTE — Progress Notes (Unsigned)
Virtual Visit via Video Note  I connected with Janice Ryan, on 05/04/2023 at 1:26 PM by video and verified that I am speaking with the correct person using two identifiers.  Consent: I discussed the limitations, risks, security and privacy concerns of performing an evaluation and management service by video and the availability of in person appointments. I also discussed with the patient that there may be a patient responsible charge related to this service. The patient expressed understanding and agreed to proceed.   Location of Patient: Home  Location of Provider: [facility]   Persons participating in Telemedicine visit: Marvelle Span Ricky Stabs, NP Curley Spice, CMA   History of Present Illness: Janice Ryan is a 68 y.o. female who presents for hospital discharge follow-up.   Her issues/concerns for discussion includes: - Patient states feeling overall improved since hospital discharge. States her son is assisting her as needed.  - Reports she is on 5 liters continuous oxygen and BiPap during nighttime. States needs a walk test. Requests referral to Pulmonology. - Chronic knee and chronic back pain. Denies recent trauma/injury and red flag symptoms. Tramadol and Gabapentin helping with chronic pain. Requests referral to Orthopedics and Pain Management.  - States she needs a scooter because "walker doesn't help".  - Requests referral to Bariatric Surgery. States she is has lost 143 lbs and currently 421 lbs. States she needs to lose 75 to 100 pounds before Orthopedics will perform surgery.  - Requests home health to assist with activities of daily living.  - States she needs a shower chair. - Pancreatitis. Requests referral to Gastroenterology.  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  - Established with Psychiatry. She denies thoughts of self-harm, suicidal ideations, homicidal ideations. -  Mammogram due. - Hot flashes and menopause. Requests referral to Gynecology.  - States thinks she has a sinus infection and needs an antibiotic.     Past Medical History:  Diagnosis Date   Acute respiratory failure with hypoxia (HCC)    Asthma    Hypertension    Morbid obesity (HCC)    Obesity with alveolar hypoventilation and body mass index (BMI) of 40 or greater (HCC)    OSA (obstructive sleep apnea)    Allergies  Allergen Reactions   Neurontin [Gabapentin] Other (See Comments)    Has un-controlled movement of her body, excessive dribble of saliva at night   Sulfa Antibiotics Nausea And Vomiting and Other (See Comments)    Stomach pain    Current Outpatient Medications on File Prior to Visit  Medication Sig Dispense Refill   acetaminophen (TYLENOL) 500 MG tablet Take 2 tablets (1,000 mg total) by mouth every 8 (eight) hours as needed for mild pain (pain score 1-3) or headache. 30 tablet 0   albuterol (PROVENTIL) (2.5 MG/3ML) 0.083% nebulizer solution Take 3 mLs (2.5 mg total) by nebulization every 6 (six) hours as needed for wheezing or shortness of breath. 150 mL 2   albuterol (VENTOLIN HFA) 108 (90 Base) MCG/ACT inhaler Inhale 2 puffs into the lungs every 6 (six) hours as needed for wheezing or shortness of breath. 18 g 2   amitriptyline (ELAVIL) 25 MG tablet Take 25 mg by mouth at bedtime.     busPIRone (BUSPAR) 10 MG tablet Take 10 mg by mouth 3 (three) times daily.     capsicum (ZOSTRIX) 0.075 % topical cream Apply topically 2 (two) times daily. 28.3 g 0   carbamide peroxide (DEBROX) 6.5 % OTIC solution Place  4 drops into both ears daily as needed (to clean ears).     cetirizine (ZYRTEC ALLERGY) 10 MG tablet Take 1 tablet (10 mg total) by mouth daily. 30 tablet 2   diclofenac Sodium (VOLTAREN) 1 % GEL Apply 2 g topically 4 (four) times daily as needed. (Patient taking differently: Apply 1 Application topically every 6 (six) hours as needed (knee pain).) 350 g 0   doxepin  (SINEQUAN) 50 MG capsule Take 1 capsule (50 mg total) by mouth in the morning. 5 capsule 0   DULoxetine (CYMBALTA) 60 MG capsule Take 2 capsules (120 mg total) by mouth daily. 30 capsule 3   fluticasone (FLONASE) 50 MCG/ACT nasal spray Place 2 sprays into both nostrils daily.  2   fluticasone-salmeterol (ADVAIR) 500-50 MCG/ACT AEPB Inhale 1 puff into the lungs in the morning and at bedtime. 60 each 2   hydrochlorothiazide (HYDRODIURIL) 12.5 MG tablet Take 1 tablet (12.5 mg total) by mouth daily. 30 tablet 0   mirtazapine (REMERON) 15 MG tablet Take 15 mg by mouth at bedtime.     montelukast (SINGULAIR) 10 MG tablet Take 1 tablet (10 mg total) by mouth at bedtime. 90 tablet 0   Multiple Vitamin (MULTIVITAMIN WITH MINERALS) TABS tablet Take 1 tablet by mouth daily.     NON FORMULARY at bedtime. BIPAP     Omega-3 Fatty Acids (OMEGA-3 PO) Take 1 capsule by mouth daily.     OXYGEN Inhale 5 L into the lungs continuous. (Patient not taking: Reported on 02/25/2023)     pantoprazole (PROTONIX) 40 MG tablet Take 1 tablet (40 mg total) by mouth daily. 30 tablet 0   traMADol (ULTRAM) 50 MG tablet Take 1 tablet (50 mg total) by mouth every 12 (twelve) hours as needed for severe pain (pain score 7-10). 10 tablet 0   No current facility-administered medications on file prior to visit.    Observations/Objective: Alert and oriented x 3. Not in acute distress. Physical examination not completed as this is a telemedicine visit.  Assessment and Plan: 1. Hospital discharge follow-up (Primary) - Reviewed hospital course, current medications, ensured proper follow-up in place, and addressed concerns.  - Routine screening.  - Basic Metabolic Panel; Future - CBC; Future  2. Acute on chronic respiratory failure with hypoxia and hypercapnia (HCC) 3. Shortness of breath 4. On home oxygen therapy 5. OSA treated with BiPAP 6. Asthma, unspecified asthma severity, unspecified whether complicated, unspecified whether  persistent - Referral to Pulmonology for evaluation/management. - Ambulatory referral to Pulmonology  7. Chronic back pain, unspecified back location, unspecified back pain laterality 8. Chronic knee pain, unspecified laterality - Continue Tramadol and Gabapentin as prescribed. Counseled on medication adherence/adverse effects.  - Shower stool prescribed per patient request.  - Referral to Orthopedic Surgery and Pain Clinic for evaluation/management.  - Referral to Home Health for evaluation/management. - Ambulatory referral to Orthopedic Surgery - gabapentin (NEURONTIN) 300 MG capsule; Take 1 capsule (300 mg total) by mouth at bedtime.  Dispense: 90 capsule; Refill: 0 - traMADol (ULTRAM) 50 MG tablet; Take 1 tablet (50 mg total) by mouth every 12 (twelve) hours as needed for severe pain (pain score 7-10).  Dispense: 10 tablet; Refill: 0 - Ambulatory referral to Physical Therapy - For home use only DME Shower stool - Ambulatory referral to Home Health - Ambulatory referral to Pain Clinic  9. Encounter for weight management 10. Morbid obesity with BMI of 60.0-69.9, adult (HCC) 11. Weight gain - Referral to Bariatric Surgery for evaluation/management. -  Amb Referral to Bariatric Surgery  12. Acute pancreatitis, unspecified complication status, unspecified pancreatitis type - Referral to Gastroenterology for evaluation/management. - Ambulatory referral to Gastroenterology  13. 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 - Ambulatory referral to Endocrinology  14. Depression, unspecified depression type - Patient denies thoughts of self-harm, suicidal ideations, homicidal ideations. - Keep all scheduled appointments with Psychiatry.  15.  Encounter for screening mammogram for malignant neoplasm of breast - Routine screening.  - MM 3D SCREENING MAMMOGRAM BILATERAL BREAST; Future  16. Hot flashes due to menopause - Referral to Gynecology for  evaluation/management. - Ambulatory referral to Gynecology  17. Sinusitis, unspecified chronicity, unspecified location - Amoxicillin-Clavulanate as prescribed. Counseled on medication adherence/adverse effects.  - Follow-up with primary provider as scheduled.  - amoxicillin-clavulanate (AUGMENTIN) 875-125 MG tablet; Take 1 tablet by mouth 2 (two) times daily.  Dispense: 20 tablet; Refill: 0   Follow Up Instructions: Follow-up with primary provider as scheduled.    Patient was given clear instructions to go to Emergency Department or return to medical center if symptoms don't improve, worsen, or new problems develop.The patient verbalized understanding.  I discussed the assessment and treatment plan with the patient. The patient was provided an opportunity to ask questions and all were answered. The patient agreed with the plan and demonstrated an understanding of the instructions.   The patient was advised to call back or seek an in-person evaluation if the symptoms worsen or if the condition fails to improve as anticipated.     I provided 20 minutes total of non-face-to-face time during this encounter.   Chadley Dziedzic Jodi Geralds, NP  [facility] at [facility] [place], [place] [phone] 05/04/2023, 1:26 PM

## 2023-05-04 NOTE — Telephone Encounter (Signed)
 Keep upcoming appointment.

## 2023-05-07 ENCOUNTER — Telehealth: Payer: Self-pay

## 2023-05-07 NOTE — Telephone Encounter (Signed)
Discussed with patient during recent office visit on 05/04/2023.

## 2023-05-07 NOTE — Telephone Encounter (Signed)
Referral received for home health- SN and PT.  I called the patient [phone]  to inquire if she has a preference for home health agencies. Voicemail full, unable to leave a message.  She had been referred to [facility] after discharge from the hospital on 03/06/2023.

## 2023-05-08 NOTE — Telephone Encounter (Addendum)
 I spoke to the patient and she did not have a preference for home health agencies.  She confirmed she had [facility] in the past and I told her that I would check with [facility] as well as other agencies but there is no guarantee that any agency would accept the referral. They have to be in network with her insurance company and have the staff available to accept the referral.   She explained that she needs help with personal care too.  I explained that we can submit a referral for PCS.  They will have a nurse come to evaluate her and determine if she qualifies for PCS and if so, how many hours/ day and days/week. Medicaid covers this service and is longer term than home health care.  She was very interested in [facility].  [name]- I can forward the PCS request for you to sign and then you can send it back to me to submit to Medicaid.  She then asked about a power wheelchair. She understands that a scooter would not be appropriate. I told her that I would inform Ms Lorren, NP of this request.   [name]- If you are in agreement with a power wheelchair, you can submit an order for a PT seating eval. This will be done at [facility] [address]. They will determine what the most appropriate power mobility device is for her and then send you instructions regarding next steps.    I spoke to [employer] and she requested the referral for home health services be faxed to them for review.  The order was then faxed to: [phone].  Messages also sent to the following home health agencies requesting they review the referral:  [facility]- [facility]- declined [facility]- [facility] - declined [facility] - Angie Coley- declined [facility]- Arna Sayres- declined [facility]- [facility] - declined

## 2023-05-09 ENCOUNTER — Other Ambulatory Visit: Payer: Self-pay | Admitting: Family

## 2023-05-09 ENCOUNTER — Ambulatory Visit: Payer: 59 | Admitting: Sports Medicine

## 2023-05-09 DIAGNOSIS — R262 Difficulty in walking, not elsewhere classified: Secondary | ICD-10-CM

## 2023-05-09 NOTE — Telephone Encounter (Signed)
 noted

## 2023-05-09 NOTE — Telephone Encounter (Signed)
 Noted. Physical Therapy order complete.

## 2023-05-09 NOTE — Telephone Encounter (Signed)
 Complete

## 2023-05-09 NOTE — Telephone Encounter (Addendum)
 I called [facility] and spoke to [facility] who said they will accept the referral and will call her.   She noted that they actually never saw the patient in the past because they were not able to reach her.    [name]- PT referral needs to state its for a PT seating eval, so it will go to [facility] on [address], not [employer] [facility].    I attempted to call the patient to explain that [facility] will be calling her but the voicemail was full, unable to leave a message

## 2023-05-10 ENCOUNTER — Ambulatory Visit: Payer: 59 | Admitting: Family Medicine

## 2023-05-14 ENCOUNTER — Other Ambulatory Visit: Payer: Self-pay | Admitting: Family

## 2023-05-14 ENCOUNTER — Telehealth: Payer: Self-pay

## 2023-05-14 DIAGNOSIS — K859 Acute pancreatitis without necrosis or infection, unspecified: Secondary | ICD-10-CM

## 2023-05-14 DIAGNOSIS — J45909 Unspecified asthma, uncomplicated: Secondary | ICD-10-CM

## 2023-05-14 DIAGNOSIS — Z9981 Dependence on supplemental oxygen: Secondary | ICD-10-CM

## 2023-05-14 DIAGNOSIS — G4733 Obstructive sleep apnea (adult) (pediatric): Secondary | ICD-10-CM

## 2023-05-14 DIAGNOSIS — G8929 Other chronic pain: Secondary | ICD-10-CM

## 2023-05-14 DIAGNOSIS — R0602 Shortness of breath: Secondary | ICD-10-CM

## 2023-05-14 DIAGNOSIS — J9621 Acute and chronic respiratory failure with hypoxia: Secondary | ICD-10-CM

## 2023-05-14 NOTE — Telephone Encounter (Signed)
 Patient has decreased kidney function. Meloxicam  may worsen kidney function. Referrals to Neurology, Gastroenterology, and Pulmonology complete.

## 2023-05-14 NOTE — Telephone Encounter (Unsigned)
 Copied from CRM [id number]. Topic: Clinical - Medication Question >> May 14, 2023  9:42 AM Baldomero Bone wrote: Reason for CRM: patient states that social service person for physical, and occupational therapy has not contacted the patient; tramadol  prescription is not effective in the current dosage and gabapentin  makes patient drool can only take it at night; can you prescribe meloxicam  because patient has not seen ortho doctor yet? callback number is [phone]

## 2023-05-14 NOTE — Telephone Encounter (Unsigned)
 Copied from CRM [id number]. Topic: Referral - Question >> May 14, 2023  9:45 AM Baldomero Bone wrote: Reason for CRM: Patient would like referral to neurologist and gastroenterologist in network, and  [facility] for pulmonologist. Patient has scheduled a new patient appointment on 05/28/2023 Callback number is [phone]

## 2023-05-14 NOTE — Telephone Encounter (Signed)
Routing message to CMA

## 2023-05-14 NOTE — Telephone Encounter (Signed)
 Copied from CRM [id number]. Topic: Referral - Status >> May 14, 2023 10:44 AM Elle L wrote: Reason for CRM: [facility] called to decline the referral for this patient due to staffing. Their number is [phone] if needed.

## 2023-05-15 ENCOUNTER — Telehealth: Payer: Self-pay | Admitting: Family

## 2023-05-15 NOTE — Telephone Encounter (Signed)
Copied from CRM [id number]. Topic: Referral - Request for Referral >> May 15, 2023 10:59 AM Fuller Mandril wrote: Did the patient discuss referral with their provider in the last year? Yes (If No - schedule appointment) (If Yes - send message)  Appointment offered? No  Type of order/referral and detailed reason for visit: Patient called for update on Home Health. States she has no help and needs help with things like bathing. Would like referral sent to [facility] if possible. States can call [facility]. Was also calling for Gastro and Neuro but told her those were input. She will call back to get info for those when she has a pen.   Preference of office, provider, location: [facility]  If referral order, have you been seen by this specialty before? N/A (If Yes, this issue or another issue? When? Where?  Can we respond through MyChart? Yes

## 2023-05-15 NOTE — Telephone Encounter (Signed)
I called [facility] and spoke to [place] who requested the referral be faxed for review.  The referral was then sent to [phone] as requested.  I spoke to [place] and Brianna/ [facility] who both declined the referral.  I spoke to Wanda/ [facility] who requested the referral be faxed for review and the referral was faxed to [phone] as requested.  I tried to reach the patient to provide an update and had to leave a message requesting a call back.

## 2023-05-15 NOTE — Telephone Encounter (Signed)
This RN attempted to contact patient after reviewing chart. No answer, unable to leave message. Forwarding to clinic for follow up.   Copied from CRM [id number]. Topic: Clinical - Medical Advice >> May 15, 2023 10:14 AM Janice Ryan wrote: Reason for CRM: Patient states she was advised by the provider she would get a nurse into the patient home to provide home health services. Patient states no one has showed up, she disabled and has no ho help around the house.

## 2023-05-15 NOTE — Telephone Encounter (Signed)
noted

## 2023-05-15 NOTE — Telephone Encounter (Signed)
Hi Janice Ryan-  I had 5 home health agencies decline the referral and Janice Ryan had accepted it. However, I see a message that Janice Ryan is now declining it.  I have 3 or 4 other agencies I can check with but there is no guarantee that any of the others will accept the referral.  I sent a PCS request to Janice Ryan and Janice Ryan last week. That is for personal care services  Janice Ryan just needs to sign that and return it to me

## 2023-05-16 ENCOUNTER — Telehealth: Payer: Self-pay | Admitting: Family

## 2023-05-16 ENCOUNTER — Ambulatory Visit: Payer: 59 | Admitting: Sports Medicine

## 2023-05-16 ENCOUNTER — Other Ambulatory Visit: Payer: Self-pay | Admitting: Family

## 2023-05-16 DIAGNOSIS — G8929 Other chronic pain: Secondary | ICD-10-CM

## 2023-05-16 NOTE — Telephone Encounter (Signed)
Message received from [facility] stating they are declining the referral

## 2023-05-16 NOTE — Telephone Encounter (Signed)
Spoke to patient, gave her contact info for Neurology and Gastro. Sent her MRN to [place] for her concerns with home health assistance.

## 2023-05-16 NOTE — Telephone Encounter (Signed)
Copied from CRM [id number]. Topic: Referral - Status >> May 16, 2023  2:26 PM Kristie Cowman wrote: Reason for CRM: The patient called wanting to know if her referral for [facility] had been put through yet and wanted to speak with the nurse social worker in the office. The patient said she has been alone and needs this referral as soon as possible.

## 2023-05-17 NOTE — Telephone Encounter (Signed)
Hi Jane ,  I will have to touch base with [name] on 2/18 when she returns back in office. Do they state the reason for denial?

## 2023-05-17 NOTE — Telephone Encounter (Signed)
I spoke to [facility] who stated that they declined the referral.

## 2023-05-17 NOTE — Telephone Encounter (Signed)
I tried to reach the patient to inform her that I have contacted 10 home health agencies and all have declined the referral so there are no other options for a home health nurse at this time. I had to leave a message requesting a call back.  I also want to inform her that we will still submit the [facility] request when PCP has signed off on that document.

## 2023-05-22 ENCOUNTER — Other Ambulatory Visit: Payer: Self-pay | Admitting: Family

## 2023-05-22 ENCOUNTER — Ambulatory Visit: Payer: 59

## 2023-05-22 DIAGNOSIS — J9621 Acute and chronic respiratory failure with hypoxia: Secondary | ICD-10-CM

## 2023-05-22 DIAGNOSIS — R0602 Shortness of breath: Secondary | ICD-10-CM

## 2023-05-22 DIAGNOSIS — Z9981 Dependence on supplemental oxygen: Secondary | ICD-10-CM

## 2023-05-22 DIAGNOSIS — J45909 Unspecified asthma, uncomplicated: Secondary | ICD-10-CM

## 2023-05-22 DIAGNOSIS — G4733 Obstructive sleep apnea (adult) (pediatric): Secondary | ICD-10-CM

## 2023-05-22 NOTE — Telephone Encounter (Signed)
Per  [facility]    We've previously messaged about this patient. Just wanted to reiterate that Ms Janice Ryan has cancelled or no shown every single consult that has been made for her. From my estimation, I have counted there are at least 7 consults that she has scheduled and not come to.    Per our office policy, she will not be permitted to scheduled with any of the [address] Pulmonary branches ([address] in [place], [address], [place] or [place]). She will need to be referred to another facility.    Thanks so much,  Muffy-Referral Coordinator .

## 2023-05-22 NOTE — Telephone Encounter (Signed)
-   Tramadol prescribed 05/04/2023. - Patient has decreased kidney function. Meloxicam may worsen kidney function. - Referral to Pulmonology ordered with advisement of specialist.

## 2023-05-23 NOTE — Telephone Encounter (Signed)
-   Tramadol prescribed 05/04/2023. - Patient has decreased kidney function. Meloxicam may worsen kidney function. - Referral to Pulmonology ordered with advisement of specialist.

## 2023-05-24 NOTE — Telephone Encounter (Signed)
Received. I will forward to [name] for signature

## 2023-05-24 NOTE — Telephone Encounter (Signed)
Noted. Janice Ryan, if you will please provide me with form to sign.

## 2023-05-25 ENCOUNTER — Ambulatory Visit: Payer: 59 | Admitting: Family Medicine

## 2023-05-25 NOTE — Telephone Encounter (Signed)
-   Tramadol prescribed 05/04/2023. - Patient has decreased kidney function. Meloxicam may worsen kidney function. - Referral to Pulmonology ordered with advisement of specialist.

## 2023-05-25 NOTE — Telephone Encounter (Signed)
If you will please print for me to sign. Thank you.

## 2023-05-26 ENCOUNTER — Other Ambulatory Visit: Payer: Self-pay | Admitting: Family

## 2023-05-26 DIAGNOSIS — G479 Sleep disorder, unspecified: Secondary | ICD-10-CM

## 2023-05-28 ENCOUNTER — Telehealth: Payer: Self-pay

## 2023-05-28 ENCOUNTER — Ambulatory Visit (INDEPENDENT_AMBULATORY_CARE_PROVIDER_SITE_OTHER): Payer: 59

## 2023-05-28 ENCOUNTER — Telehealth: Payer: Self-pay | Admitting: Family

## 2023-05-28 NOTE — Telephone Encounter (Signed)
 Pt is requesting home care services and wants a phone call from social worker. Pt stated she has been waiting for a phone call from [facility]. Please advise.

## 2023-05-28 NOTE — Telephone Encounter (Signed)
 Forms has been sent to Jane B.

## 2023-05-28 NOTE — Telephone Encounter (Signed)
 Copied from CRM [id number]. Topic: Referral - Question >> May 28, 2023 10:13 AM Janice Ryan wrote: Reason for CRM: Referral was done for physical therapy and where it was sent to, the pt cannot come out the house. She is asking if this can be changed to an in home therapy? Also asking if the social worker give the pt a call asap TODAY. Please follow up

## 2023-05-28 NOTE — Telephone Encounter (Signed)
 I spoke to the patient and informed her that I contacted 10 home health agencies and none of them accepted the home health referral.  I explained to her that it is not unusual for home health agencies to decline referrals either due to staffing or lack of contract with the insurance company.  I also explained to her that we have submitted a PCS request and those services are covered by Medicaid.   [facility] will be contacting her to schedule an assessment for a nurse to come to see her to evaluate her needs.  If she qualifies, then they will inform her of how many hours/ day, days/ week she is eligible for.  She was very appreciative and said she really needs help with personal care.

## 2023-05-28 NOTE — Telephone Encounter (Signed)
 Trazodone discontinued on 02/09/2023 by Rhetta Mura, MD.

## 2023-05-28 NOTE — Telephone Encounter (Signed)
Signed PCS request faxed to [facility] 

## 2023-05-30 ENCOUNTER — Ambulatory Visit: Payer: 59 | Admitting: Sports Medicine

## 2023-05-30 ENCOUNTER — Telehealth: Payer: Self-pay | Admitting: Licensed Clinical Social Worker

## 2023-05-30 NOTE — Telephone Encounter (Signed)
 noted

## 2023-05-30 NOTE — Telephone Encounter (Signed)
 Called patient today 2 times to address the referral.  Patient did not answer both times.  I was unsuccessful with leaving a voicemail due to voicemail being full.

## 2023-06-06 ENCOUNTER — Telehealth: Payer: Self-pay | Admitting: Licensed Clinical Social Worker

## 2023-06-06 ENCOUNTER — Encounter (INDEPENDENT_AMBULATORY_CARE_PROVIDER_SITE_OTHER): Payer: Self-pay

## 2023-06-06 NOTE — Telephone Encounter (Signed)
 [facility] called patient today to introduce herself and to assess patients' mental health needs. Patient did not answer the phone. L[facility] was unable to leave a brief message with the patient asking them to return the call. Patient was referred by PCP for connecting pt with some possible home health services.

## 2023-06-07 ENCOUNTER — Ambulatory Visit: Payer: 59

## 2023-06-15 ENCOUNTER — Ambulatory Visit: Payer: 59 | Admitting: Allergy

## 2023-06-25 ENCOUNTER — Ambulatory Visit (INDEPENDENT_AMBULATORY_CARE_PROVIDER_SITE_OTHER): Payer: 59 | Admitting: Family

## 2023-06-25 VITALS — BP 131/82 | HR 92 | Temp 99.4°F | Ht 68.0 in | Wt 370.0 lb

## 2023-06-25 DIAGNOSIS — R262 Difficulty in walking, not elsewhere classified: Secondary | ICD-10-CM | POA: Diagnosis not present

## 2023-06-25 DIAGNOSIS — Z131 Encounter for screening for diabetes mellitus: Secondary | ICD-10-CM

## 2023-06-25 DIAGNOSIS — Z1159 Encounter for screening for other viral diseases: Secondary | ICD-10-CM

## 2023-06-25 DIAGNOSIS — Z7689 Persons encountering health services in other specified circumstances: Secondary | ICD-10-CM

## 2023-06-25 DIAGNOSIS — M549 Dorsalgia, unspecified: Secondary | ICD-10-CM | POA: Diagnosis not present

## 2023-06-25 DIAGNOSIS — Z0000 Encounter for general adult medical examination without abnormal findings: Secondary | ICD-10-CM

## 2023-06-25 DIAGNOSIS — G8929 Other chronic pain: Secondary | ICD-10-CM

## 2023-06-25 DIAGNOSIS — Z780 Asymptomatic menopausal state: Secondary | ICD-10-CM

## 2023-06-25 DIAGNOSIS — M25569 Pain in unspecified knee: Secondary | ICD-10-CM

## 2023-06-25 DIAGNOSIS — Z124 Encounter for screening for malignant neoplasm of cervix: Secondary | ICD-10-CM

## 2023-06-25 DIAGNOSIS — Z13820 Encounter for screening for osteoporosis: Secondary | ICD-10-CM

## 2023-06-25 DIAGNOSIS — Z13228 Encounter for screening for other metabolic disorders: Secondary | ICD-10-CM

## 2023-06-25 DIAGNOSIS — Z1231 Encounter for screening mammogram for malignant neoplasm of breast: Secondary | ICD-10-CM

## 2023-06-25 DIAGNOSIS — Z13220 Encounter for screening for lipoid disorders: Secondary | ICD-10-CM

## 2023-06-25 DIAGNOSIS — Z130 Encounter for screening for diseases of the blood and blood-forming organs and certain disorders involving the immune mechanism: Secondary | ICD-10-CM

## 2023-06-25 DIAGNOSIS — R635 Abnormal weight gain: Secondary | ICD-10-CM

## 2023-06-25 DIAGNOSIS — Z1211 Encounter for screening for malignant neoplasm of colon: Secondary | ICD-10-CM

## 2023-06-25 DIAGNOSIS — Z0100 Encounter for examination of eyes and vision without abnormal findings: Secondary | ICD-10-CM

## 2023-06-25 DIAGNOSIS — Z6841 Body Mass Index (BMI) 40.0 and over, adult: Secondary | ICD-10-CM

## 2023-06-25 DIAGNOSIS — Z1329 Encounter for screening for other suspected endocrine disorder: Secondary | ICD-10-CM

## 2023-06-25 DIAGNOSIS — E214 Other specified disorders of parathyroid gland: Secondary | ICD-10-CM | POA: Diagnosis not present

## 2023-06-25 MED ORDER — KETOROLAC TROMETHAMINE 30 MG/ML IJ SOLN
30.0000 mg | Freq: Once | INTRAMUSCULAR | Status: DC
  Administered 2023-06-25: 30 mg via INTRAMUSCULAR

## 2023-06-25 MED ORDER — TRIAMCINOLONE ACETONIDE 40 MG/ML IJ SUSP
40.0000 mg | Freq: Once | INTRAMUSCULAR | Status: AC
  Administered 2023-06-25: 40 mg via INTRAMUSCULAR

## 2023-06-25 NOTE — Progress Notes (Signed)
 Welcome To Medicare  Janice Ryan is a 68 y.o. female who presents for a Welcome to Medicare exam. She is accompanied by her sister.   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 Requests referral to Endocrinology. - Menopause began at 68 y.o. States thinks menopause contributing to weight gain.  - States she no longer wants to have bariatric surgery. Requests referral to weight specialist. States she is considering beginning a weight loss injection.  - Chronic back and chronic knee pain persisting. Denies recent trauma/injury and red flag symptoms. States Gabapentin causes her to "jerk". States she has upcoming appointment with Orthopedics in 3 days.  - States she would like a scooter to assist with mobility. - Reports upcoming appointment with Pulmonology.  - No further issues/concerns for discussion today.   Patient Active Problem List   Diagnosis Date Noted   Hypokalemia 02/25/2023   Morbid obesity with BMI of 60.0-69.9, adult (HCC) 02/25/2023   OSA (obstructive sleep apnea) 02/07/2023   Obesity hypoventilation syndrome (HCC) 02/07/2023   Asthma, chronic, unspecified asthma severity, with acute exacerbation 02/07/2023   Suspected novel influenza A virus infection 02/01/2022   AKI (acute kidney injury) (HCC) 01/25/2022   Hypercalcemia 01/24/2022   Pneumonia due to infectious organism 01/23/2022   Acute on chronic respiratory failure with hypoxia and hypercapnia (HCC) 01/21/2022   Fall at home, initial encounter 01/21/2022   Poor social situation 01/21/2022   Depression 03/31/2019   Major depressive disorder, recurrent episode, 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   Chronic respiratory failure with hypoxia (HCC) 12/10/2018   HTN (hypertension) 12/10/2018   Chronic pain of right knee    Moderate persistent asthma 07/28/2018    Health Maintenance Due  Topic Date Due   Hepatitis C Screening  Never done   DTaP/Tdap/Td (1 - Tdap) Never done   Zoster  Vaccines- Shingrix (1 of 2) Never done   Cervical Cancer Screening (HPV/Pap Cotest)  Never done   Colonoscopy  Never done   MAMMOGRAM  Never done   COVID-19 Vaccine (2 - Pfizer risk series) 04/08/2020   DEXA SCAN  Never done    Health Habits Exercise:  limited due to chronic back and chronic knee pain Diet:  states she "doesn't eat much"   Depression Screen Over the past two weeks have you:     Felt down or depressed? no     Had little interest or pleasure in doing things? no     Additional depression screening completed? no.  She denies thoughts of self-harm, suicidal ideations, homicidal ideations.       Functional Ability Does the patient need help with: Myrtis Ser index)         Bathing: yes         Dressing : no         Toileting: no         Transferring: yes         Continence: No         Feeding: no           Safety Screen Does the home have:         Rugs in the hallway: no         Grab bars in the bathroom: no         Handrails on the stairs:no         Stairs in home: no         Poor lightning: no  Hearing Evaluation Do you have trouble hearing  the television when others do not? no Do you have to strain to hear/understand conversations? no   Falls Risk: Does the patient need assistance with ambulation? yes Does the patient have a history of a fall in the last 90 days? no Is the patient at risk for falls? yes Was the patient's timed "Get Up and Go Test" unsteady or longer than 30 seconds? yes   Advanced Care Planning Patient has executed an Advance Directive: no If no, patient was given the opportunity to execute an Advance Directive today? yes This patient has the ability to prepare an Advance Directive: yes   Cognitive Assessment Does the patient have evidence of cognitive impairment? No The patient does not have evidence of a change in mood/affect, appearance, speech, memory or motor skills.     PHYSICAL EXAM: Vitals:   06/25/23 1426 06/25/23 1511  BP:  (!) 172/89 131/82  Pulse: 92   Temp: 99.4 F (37.4 C)   TempSrc: Oral   SpO2: 92%   Weight: (!) 370 lb (167.8 kg)   Height: 5\' 8"  (1.727 m)    Body mass index is 56.26 kg/m.   Vision screen performed. See vision activity in chart for documentation Other exam performed: No:    Assessment: Physical Exam HENT:     Head: Normocephalic and atraumatic.     Right Ear: Tympanic membrane, ear canal and external ear normal.     Left Ear: Tympanic membrane, ear canal and external ear normal.     Nose: Nose normal.     Mouth/Throat:     Mouth: Mucous membranes are moist.     Pharynx: Oropharynx is clear.  Eyes:     Extraocular Movements: Extraocular movements intact.     Conjunctiva/sclera: Conjunctivae normal.     Pupils: Pupils are equal, round, and reactive to light.  Neck:     Thyroid: No thyroid mass, thyromegaly or thyroid tenderness.  Cardiovascular:     Rate and Rhythm: Normal rate and regular rhythm.     Pulses: Normal pulses.     Heart sounds: Normal heart sounds.  Pulmonary:     Effort: Pulmonary effort is normal.     Breath sounds: Normal breath sounds.  Chest:     Comments: Patient declined. Abdominal:     General: Bowel sounds are normal.     Palpations: Abdomen is soft.  Genitourinary:    Comments: Patient declined. Musculoskeletal:        General: Normal range of motion.     Right shoulder: Normal.     Left shoulder: Normal.     Right upper arm: Normal.     Left upper arm: Normal.     Right elbow: Normal.     Left elbow: Normal.     Right forearm: Normal.     Left forearm: Normal.     Right wrist: Normal.     Left wrist: Normal.     Right hand: Normal.     Left hand: Normal.     Cervical back: Normal, normal range of motion and neck supple.     Thoracic back: Normal.     Lumbar back: Normal.     Right hip: Normal.     Left hip: Normal.     Right upper leg: Normal.     Left upper leg: Normal.     Right knee: Normal.     Left knee: Normal.      Right lower leg: Normal.     Left lower leg:  Normal.     Right ankle: Normal.     Left ankle: Normal.     Right foot: Normal.     Left foot: Normal.  Skin:    General: Skin is warm and dry.     Capillary Refill: Capillary refill takes less than 2 seconds.  Neurological:     General: No focal deficit present.     Mental Status: She is alert and oriented to person, place, and time.  Psychiatric:        Mood and Affect: Mood normal.        Behavior: Behavior normal.      Assessment & Plan: During the course of the visit the patient was educated and counseled about appropriate screening and preventive services including: Screening mammography Screening Pap smear and pelvic exam  Bone densitometry screening Colorectal cancer screening Diabetes screening Advanced directives: patient provided with information   1. Medicare annual wellness visit, initial (Primary) - Counseled on 150 minutes of exercise per week as tolerated, healthy eating (including decreased daily intake of saturated fats, cholesterol, added sugars, sodium), STI prevention, and routine healthcare maintenance.  2. Screening for metabolic disorder - Routine screening.  - CMP14+EGFR  3. Screening for deficiency anemia - Routine screening.  - CBC  4. Diabetes mellitus screening - Routine screening.  - Hemoglobin A1c  5. Screening cholesterol level - Routine screening.  - Lipid panel  6. Thyroid disorder screen - Routine screening.  - TSH  7. Other specified disorders of parathyroid gland [facility]) - Referral to Endocrinology for evaluation/management. - Ambulatory referral to Endocrinology  8. Need for hepatitis C screening test - Routine screening.  - Hepatitis C Antibody  9. Encounter for screening mammogram for malignant neoplasm of breast - Routine screening.  - MM Digital Screening; Future  10. Pap smear for cervical cancer screening 11. Menopause - Referral to Gynecology for  evaluation/management. - Ambulatory referral to Gynecology  12. Osteoporosis screening - Routine screening.  - DG Bone Density; Future  13. Colon cancer screening - Referral to Gastroenterology for colon cancer screening by colonoscopy. - Ambulatory referral to Gastroenterology  14. Routine eye exam - Referral to Ophthalmology for evaluation/management. - Ambulatory referral to Ophthalmology  15. Chronic back pain, unspecified back location, unspecified back pain laterality 16. Chronic knee pain, unspecified laterality - Triamcinolone Acetonide injection administered in office. - Keep all scheduled appointments with Orthopedics. - triamcinolone acetonide (KENALOG-40) injection 40 mg  17. Difficulty walking - Referral to Physical Therapy for evaluation/management. - Ambulatory referral to Physical Therapy  18. Encounter for weight management 19. BMI 50.0-59.9, adult (HCC) 20. Weight gain - Referral to Medical Weight Management for evaluation/management. - Amb Ref to Medical Weight Management   The following orders were placed at today's visit; Orders Placed This Encounter  Procedures   DG Bone Density   MM Digital Screening   CBC   Lipid panel   CMP14+EGFR   Hemoglobin A1c   TSH   Hepatitis C Antibody   Ambulatory referral to Endocrinology   Ambulatory referral to Physical Therapy   Ambulatory referral to Gynecology   Amb Ref to Medical Weight Management   Ambulatory referral to Gastroenterology   Ambulatory referral to Ophthalmology     An after visit summary with all of these plans was given to the patient.

## 2023-06-25 NOTE — Progress Notes (Signed)
 Patient wants to discuss menopause.  States bad spasm l in lower back to right leg. States she has been in bed for last 3 days for pain medication.   Has multiple complaints to discuss.

## 2023-06-26 ENCOUNTER — Encounter (INDEPENDENT_AMBULATORY_CARE_PROVIDER_SITE_OTHER): Payer: Self-pay

## 2023-06-26 ENCOUNTER — Encounter: Payer: Self-pay | Admitting: Family

## 2023-06-26 ENCOUNTER — Other Ambulatory Visit: Payer: Self-pay | Admitting: Family

## 2023-06-26 DIAGNOSIS — Z13220 Encounter for screening for lipoid disorders: Secondary | ICD-10-CM

## 2023-06-26 DIAGNOSIS — N1831 Chronic kidney disease, stage 3a: Secondary | ICD-10-CM

## 2023-06-26 LAB — CBC
Hematocrit: 36.6 % (ref 34.0–46.6)
Hemoglobin: 11.6 g/dL (ref 11.1–15.9)
MCH: 28.4 pg (ref 26.6–33.0)
MCHC: 31.7 g/dL (ref 31.5–35.7)
MCV: 90 fL (ref 79–97)
Platelets: 291 10*3/uL (ref 150–450)
RBC: 4.09 x10E6/uL (ref 3.77–5.28)
RDW: 12.4 % (ref 11.7–15.4)
WBC: 9.7 10*3/uL (ref 3.4–10.8)

## 2023-06-26 LAB — HEMOGLOBIN A1C
Est. average glucose Bld gHb Est-mCnc: 100 mg/dL
Hgb A1c MFr Bld: 5.1 % (ref 4.8–5.6)

## 2023-06-26 LAB — LIPID PANEL
Chol/HDL Ratio: 2.5 ratio (ref 0.0–4.4)
Cholesterol, Total: 212 mg/dL — ABNORMAL HIGH (ref 100–199)
HDL: 84 mg/dL (ref >39)
LDL Chol Calc (NIH): 109 mg/dL — ABNORMAL HIGH (ref 0–99)
Triglycerides: 112 mg/dL (ref 0–149)
VLDL Cholesterol Cal: 19 mg/dL (ref 5–40)

## 2023-06-26 LAB — CMP14+EGFR
ALT: 6 IU/L (ref 0–32)
AST: 11 IU/L (ref 0–40)
Albumin: 4.1 g/dL (ref 3.9–4.9)
Alkaline Phosphatase: 93 IU/L (ref 44–121)
BUN/Creatinine Ratio: 14 (ref 12–28)
BUN: 17 mg/dL (ref 8–27)
Bilirubin Total: 0.4 mg/dL (ref 0.0–1.2)
CO2: 30 mmol/L — ABNORMAL HIGH (ref 20–29)
Calcium: 10.9 mg/dL — ABNORMAL HIGH (ref 8.7–10.3)
Chloride: 98 mmol/L (ref 96–106)
Creatinine, Ser: 1.21 mg/dL — ABNORMAL HIGH (ref 0.57–1.00)
Globulin, Total: 2.7 g/dL (ref 1.5–4.5)
Glucose: 122 mg/dL — ABNORMAL HIGH (ref 70–99)
Potassium: 4.4 mmol/L (ref 3.5–5.2)
Sodium: 140 mmol/L (ref 134–144)
Total Protein: 6.8 g/dL (ref 6.0–8.5)
eGFR: 50 mL/min/{1.73_m2} — ABNORMAL LOW (ref >59)

## 2023-06-26 LAB — TSH: TSH: 1.27 u[IU]/mL (ref 0.450–4.500)

## 2023-06-26 LAB — HEPATITIS C ANTIBODY: Hep C Virus Ab: NONREACTIVE

## 2023-06-26 NOTE — Progress Notes (Deleted)
 New Patient Note  RE: Janice Ryan MRN: [id number] DOB: [birth year] Date of Office Visit: 06/27/2023  Consult requested by: [name], NP Primary care provider: [name], NP  Chief Complaint: No chief complaint on file.  History of Present Illness: I had the pleasure of seeing Janice Ryan for initial evaluation at the [facility] on 06/26/2023. She is a 68 y.o. female, who is referred here by [name], NP for the evaluation of asthma.  Discussed the use of AI scribe software for clinical note transcription with the patient, who gave verbal consent to proceed.  History of Present Illness             She reports symptoms of *** chest tightness, shortness of breath, coughing, wheezing, nocturnal awakenings for *** years. Current medications include *** which help. She reports *** using aerochamber with inhalers. She tried the following inhalers: ***. Main triggers are ***allergies, infections, weather changes, smoke, exercise, pet exposure. In the last month, frequency of symptoms: ***x/week. Frequency of nocturnal symptoms: ***x/month. Frequency of SABA use: ***x/week. Interference with physical activity: ***. Sleep is ***disturbed. In the last 12 months, emergency room visits/urgent care visits/doctor office visits or hospitalizations due to respiratory issues: ***. In the last 12 months, oral steroids courses: ***. Lifetime history of hospitalization for respiratory issues: ***. Prior intubations: ***. Asthma was diagnosed at age *** by ***. History of pneumonia: ***. She was evaluated by allergist ***pulmonologist in the past. Smoking exposure: ***. Up to date with flu vaccine: ***. Up to date with pneumonia vaccine: ***. Up to date with COVID-19 vaccine: ***. Prior Covid-19 infection: ***. History of reflux: ***.  Assessment and Plan: Brad is a 68 y.o. female with: ***  Assessment and Plan               No follow-ups on  file.  No orders of the defined types were placed in this encounter.  Lab Orders  No laboratory test(s) ordered today    Other allergy screening: Asthma: {Blank single:19197::"yes","no"} Rhino conjunctivitis: {Blank single:19197::"yes","no"} Food allergy: {Blank single:19197::"yes","no"} Medication allergy: {Blank single:19197::"yes","no"} Hymenoptera allergy: {Blank single:19197::"yes","no"} Urticaria: {Blank single:19197::"yes","no"} Eczema:{Blank single:19197::"yes","no"} History of recurrent infections suggestive of immunodeficency: {Blank single:19197::"yes","no"}  Diagnostics: Spirometry:  Tracings reviewed. Her effort: {Blank single:19197::"Good reproducible efforts.","It was hard to get consistent efforts and there is a question as to whether this reflects a maximal maneuver.","Poor effort, data can not be interpreted."} FVC: ***L FEV1: ***L, ***% predicted FEV1/FVC ratio: ***% Interpretation: {Blank single:19197::"Spirometry consistent with mild obstructive disease","Spirometry consistent with moderate obstructive disease","Spirometry consistent with severe obstructive disease","Spirometry consistent with possible restrictive disease","Spirometry consistent with mixed obstructive and restrictive disease","Spirometry uninterpretable due to technique","Spirometry consistent with normal pattern","No overt abnormalities noted given today's efforts"}.  Please see scanned spirometry results for details.  Skin Testing: {Blank single:19197::"Select foods","Environmental allergy panel","Environmental allergy panel and select foods","Food allergy panel","None","Deferred due to recent antihistamines use"}. *** Results discussed with patient/family.   Past Medical History: Patient Active Problem List   Diagnosis Date Noted  . Hypokalemia 02/25/2023  . Morbid obesity with BMI of 60.0-69.9, adult (HCC) 02/25/2023  . OSA (obstructive sleep apnea) 02/07/2023  . Obesity hypoventilation  syndrome (HCC) 02/07/2023  . Asthma, chronic, unspecified asthma severity, with acute exacerbation 02/07/2023  . Suspected novel influenza A virus infection 02/01/2022  . AKI (acute kidney injury) (HCC) 01/25/2022  . Hypercalcemia 01/24/2022  . Pneumonia due to infectious organism 01/23/2022  . Acute on chronic respiratory failure with hypoxia  and hypercapnia (HCC) 01/21/2022  . Fall at home, initial encounter 01/21/2022  . Poor social situation 01/21/2022  . Depression 03/31/2019  . Major depressive disorder, recurrent episode, 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  . Chronic respiratory failure with hypoxia (HCC) 12/10/2018  . HTN (hypertension) 12/10/2018  . Chronic pain of right knee   . Moderate persistent asthma 07/28/2018   Past Medical History:  Diagnosis Date  . Acute respiratory failure with hypoxia (HCC)   . Asthma   . Hypertension   . Morbid obesity (HCC)   . Obesity with alveolar hypoventilation and body mass index (BMI) of 40 or greater (HCC)   . OSA (obstructive sleep apnea)    Past Surgical History: Past Surgical History:  Procedure Laterality Date  . HERNIA REPAIR    . JOINT REPLACEMENT Left    knee   Medication List:  Current Outpatient Medications  Medication Sig Dispense Refill  . acetaminophen (TYLENOL) 500 MG tablet Take 2 tablets (1,000 mg total) by mouth every 8 (eight) hours as needed for mild pain (pain score 1-3) or headache. (Patient not taking: Reported on 06/25/2023) 30 tablet 0  . albuterol (PROVENTIL) (2.5 MG/3ML) 0.083% nebulizer solution Take 3 mLs (2.5 mg total) by nebulization every 6 (six) hours as needed for wheezing or shortness of breath. 150 mL 2  . albuterol (VENTOLIN HFA) 108 (90 Base) MCG/ACT inhaler Inhale 2 puffs into the lungs every 6 (six) hours as needed for wheezing or shortness of breath. 18 g 2  . amitriptyline (ELAVIL) 25 MG tablet Take 25 mg by mouth at bedtime. (Patient not taking: Reported on 06/25/2023)    . amoxicillin-clavulanate  (AUGMENTIN) 875-125 MG tablet Take 1 tablet by mouth 2 (two) times daily. (Patient not taking: Reported on 06/25/2023) 20 tablet 0  . busPIRone (BUSPAR) 10 MG tablet Take 10 mg by mouth 3 (three) times daily.    . capsicum (ZOSTRIX) 0.075 % topical cream Apply topically 2 (two) times daily. (Patient not taking: Reported on 06/25/2023) 28.3 g 0  . carbamide peroxide (DEBROX) 6.5 % OTIC solution Place 4 drops into both ears daily as needed (to clean ears).    . cetirizine (ZYRTEC ALLERGY) 10 MG tablet Take 1 tablet (10 mg total) by mouth daily. 30 tablet 2  . diclofenac Sodium (VOLTAREN) 1 % GEL Apply 2 g topically 4 (four) times daily as needed. (Patient taking differently: Apply 1 Application topically every 6 (six) hours as needed (knee pain).) 350 g 0  . doxepin (SINEQUAN) 50 MG capsule Take 1 capsule (50 mg total) by mouth in the morning. 5 capsule 0  . DULoxetine (CYMBALTA) 60 MG capsule Take 2 capsules (120 mg total) by mouth daily. 30 capsule 3  . fluticasone (FLONASE) 50 MCG/ACT nasal spray Place 2 sprays into both nostrils daily.  2  . fluticasone-salmeterol (ADVAIR) 500-50 MCG/ACT AEPB Inhale 1 puff into the lungs in the morning and at bedtime. 60 each 2  . gabapentin (NEURONTIN) 300 MG capsule Take 1 capsule (300 mg total) by mouth at bedtime. 90 capsule 0  . hydrochlorothiazide (HYDRODIURIL) 12.5 MG tablet Take 1 tablet (12.5 mg total) by mouth daily. 30 tablet 0  . mirtazapine (REMERON) 15 MG tablet Take 15 mg by mouth at bedtime.    . montelukast (SINGULAIR) 10 MG tablet Take 1 tablet (10 mg total) by mouth at bedtime. 90 tablet 0  . Multiple Vitamin (MULTIVITAMIN WITH MINERALS) TABS tablet Take 1 tablet by mouth daily.    . NON FORMULARY at  bedtime. BIPAP    . Omega-3 Fatty Acids (OMEGA-3 PO) Take 1 capsule by mouth daily.    . OXYGEN Inhale 5 L into the lungs continuous.    . pantoprazole (PROTONIX) 40 MG tablet Take 1 tablet (40 mg total) by mouth daily. 30 tablet 0  . traMADol  (ULTRAM) 50 MG tablet Take 1 tablet (50 mg total) by mouth every 12 (twelve) hours as needed for severe pain (pain score 7-10). 10 tablet 0   No current facility-administered medications for this visit.   Allergies: Allergies  Allergen Reactions  . Neurontin [Gabapentin] Other (See Comments)    Has un-controlled movement of her body, excessive dribble of saliva at night  . Sulfa Antibiotics Nausea And Vomiting and Other (See Comments)    Stomach pain   Social History: Social History   Socioeconomic History  . Marital status: Married    Spouse name: Not on file  . Number of children: Not on file  . Years of education: Not on file  . Highest education level: Not on file  Occupational History  . Not on file  Tobacco Use  . Smoking status: Never  . Smokeless tobacco: Never  Vaping Use  . Vaping status: Never Used  Substance and Sexual Activity  . Alcohol use: Never  . Drug use: Never  . Sexual activity: Not Currently  Other Topics Concern  . Not on file  Social History Narrative  . Not on file   Social Drivers of Health   Financial Resource Strain: Not on file  Food Insecurity: No Food Insecurity (02/26/2023)   Hunger Vital Sign   . Worried About Programme researcher, broadcasting/film/video in the Last Year: Never true   . Ran Out of Food in the Last Year: Never true  Transportation Needs: Unmet Transportation Needs (02/26/2023)   PRAPARE - Transportation   . Lack of Transportation (Medical): Yes   . Lack of Transportation (Non-Medical): Yes  Physical Activity: Not on file  Stress: Not on file  Social Connections: Unknown (10/10/2022)   [facility]   Social Network   . Social Network: Not on file   Lives in a ***. Smoking: *** Occupation: ***  Environmental HistorySurveyor, minerals in the house: Copywriter, advertising in the family room: {Blank single:19197::"yes","no"} Carpet in the bedroom: {Blank single:19197::"yes","no"} Heating:  {Blank single:19197::"electric","gas","heat pump"} Cooling: {Blank single:19197::"central","window","heat pump"} Pet: {Blank single:19197::"yes ***","no"}  Family History: Family History  Problem Relation Age of Onset  . Atrial fibrillation Mother   . Congestive Heart Failure Father    Problem                               Relation Asthma                                   *** Eczema                                *** Food allergy                          *** Allergic rhino conjunctivitis     ***  Review of Systems  Constitutional:  Negative for appetite change, chills, fever and unexpected weight change.  HENT:  Negative for  congestion and rhinorrhea.   Eyes:  Negative for itching.  Respiratory:  Negative for cough, chest tightness, shortness of breath and wheezing.   Cardiovascular:  Negative for chest pain.  Gastrointestinal:  Negative for abdominal pain.  Genitourinary:  Negative for difficulty urinating.  Skin:  Negative for rash.  Neurological:  Negative for headaches.   Objective: There were no vitals taken for this visit. There is no height or weight on file to calculate BMI. Physical Exam Vitals and nursing note reviewed.  Constitutional:      Appearance: Normal appearance. She is well-developed.  HENT:     Head: Normocephalic and atraumatic.     Right Ear: Tympanic membrane and external ear normal.     Left Ear: Tympanic membrane and external ear normal.     Nose: Nose normal.     Mouth/Throat:     Mouth: Mucous membranes are moist.     Pharynx: Oropharynx is clear.  Eyes:     Conjunctiva/sclera: Conjunctivae normal.  Cardiovascular:     Rate and Rhythm: Normal rate and regular rhythm.     Heart sounds: Normal heart sounds. No murmur heard.    No friction rub. No gallop.  Pulmonary:     Effort: Pulmonary effort is normal.     Breath sounds: Normal breath sounds. No wheezing, rhonchi or rales.  Musculoskeletal:     Cervical back: Neck supple.  Skin:     General: Skin is warm.     Findings: No rash.  Neurological:     Mental Status: She is alert and oriented to person, place, and time.  Psychiatric:        Behavior: Behavior normal.  The plan was reviewed with the patient/family, and all questions/concerned were addressed.  It was my pleasure to see Chaela today and participate in her care. Please feel free to contact me with any questions or concerns.  Sincerely,  Wyline Mood, DO Allergy & Immunology  [facility] office: [phone] [facility] office: [phone]

## 2023-06-27 ENCOUNTER — Ambulatory Visit

## 2023-06-27 ENCOUNTER — Telehealth: Payer: Self-pay

## 2023-06-27 ENCOUNTER — Ambulatory Visit: Admitting: Allergy

## 2023-06-27 ENCOUNTER — Telehealth: Payer: Self-pay | Admitting: Family

## 2023-06-27 NOTE — Telephone Encounter (Signed)
-   Dr. Andrey Campanile please advise regarding Tramadol.   -Triamcinolone Acetonide injection administered in office for pain.

## 2023-06-27 NOTE — Telephone Encounter (Signed)
 Telephone encounter sent to [name] regarding this.

## 2023-06-27 NOTE — Telephone Encounter (Signed)
 Patient is calling in about Cymbalta prescription. Please Advise

## 2023-06-27 NOTE — Telephone Encounter (Signed)
 Copied from CRM [id number]. Topic: Clinical - Prescription Issue >> Jun 27, 2023  2:02 PM Carlatta H wrote: Reason for CRM: Please give the patient a call about cymbalta prescription//

## 2023-06-28 ENCOUNTER — Other Ambulatory Visit: Payer: Self-pay | Admitting: Family

## 2023-06-28 DIAGNOSIS — G8929 Other chronic pain: Secondary | ICD-10-CM

## 2023-06-28 MED ORDER — DULOXETINE HCL 30 MG PO CPEP: 30.0000 mg | ORAL_CAPSULE | Freq: Every day | ORAL | 1 refills | Status: DC

## 2023-06-28 NOTE — Telephone Encounter (Signed)
 Complete

## 2023-06-28 NOTE — Telephone Encounter (Signed)
 Copied from CRM [id number]. Topic: Clinical - Medication Refill >> Jun 28, 2023  5:25 PM DeAngela L wrote: Most Recent Primary Care Visit:  Provider: Ricky Stabs J  Department: [facility]  Visit Type: ANNUAL WELL VISIT, INITIAL  Date: 06/25/2023  Medication: diclofenac Sodium (VOLTAREN) 1 % GEL DULoxetine (CYMBALTA) 60 MG capsule traMADol (ULTRAM) 50 MG tablet   Has the patient contacted their pharmacy? Yes  (Agent: If no, request that the patient contact the pharmacy for the refill. If patient does not wish to contact the pharmacy document the reason why and proceed with request.) (Agent: If yes, when and what did the pharmacy advise?)  Is this the correct pharmacy for this prescription? Yes  If no, delete pharmacy and type the correct one.  This is the patient's preferred pharmacy:  [id number] [place], [place] - [address] [place] [address] Phone: [phone] Fax: [phone]   Has the prescription been filled recently? Yes   Is the patient out of the medication? Yes   Has the patient been seen for an appointment in the last year OR does the patient have an upcoming appointment? Yes   Can we respond through MyChart? No   Agent: Please be advised that Rx refills may take up to 3 business days. We ask that you follow-up with your pharmacy.

## 2023-06-29 ENCOUNTER — Other Ambulatory Visit: Payer: Self-pay | Admitting: Family

## 2023-06-29 DIAGNOSIS — G8929 Other chronic pain: Secondary | ICD-10-CM

## 2023-06-29 MED ORDER — DICLOFENAC SODIUM 1 % EX GEL: 2.0000 g | Freq: Four times a day (QID) | CUTANEOUS | 1 refills | Status: DC | PRN

## 2023-06-29 NOTE — Telephone Encounter (Signed)
-   Awaiting reply from supervising physician Georganna Skeans, MD to see if Tramadol appropriate to refill.  - Diclofenac Sodium Gel prescribed 06/29/2023.  - Duloxetine prescribed 06/28/2023.

## 2023-07-02 ENCOUNTER — Other Ambulatory Visit: Payer: Self-pay | Admitting: Family

## 2023-07-02 DIAGNOSIS — G8929 Other chronic pain: Secondary | ICD-10-CM

## 2023-07-02 MED ORDER — DICLOFENAC SODIUM 75 MG PO TBEC: 75.0000 mg | DELAYED_RELEASE_TABLET | Freq: Two times a day (BID) | ORAL | 0 refills | Status: DC

## 2023-07-02 NOTE — Telephone Encounter (Signed)
 Complete

## 2023-07-03 ENCOUNTER — Inpatient Hospital Stay: Admission: RE | Admit: 2023-07-03 | Source: Ambulatory Visit

## 2023-07-09 ENCOUNTER — Other Ambulatory Visit: Payer: Self-pay | Admitting: Family

## 2023-07-09 DIAGNOSIS — G8929 Other chronic pain: Secondary | ICD-10-CM

## 2023-07-10 NOTE — Telephone Encounter (Signed)
 I have attempted without success to contact this patient by phone to I left a message on answering machine.

## 2023-07-10 NOTE — Telephone Encounter (Signed)
 Requested medication (s) are due for refill today: na   Requested medication (s) are on the active medication list: yes tramadol , no meloxicam 15 mg  Last refill:  05/04/23 #10 0 refills  Future visit scheduled: no   Notes to clinic:  not delegated per protocol - tramadol  Maximum MME cannot be calculated for this prescription. Enter discrete sig details to calculate maximum MME.   Meloxicam 15 mg - historical medication do you want to refill Rx?     Requested Prescriptions  Pending Prescriptions Disp Refills   traMADol (ULTRAM) 50 MG tablet [Pharmacy Med Name: TRAMADOL HCL 50 MG TABLET] 10 tablet 0    Sig: TAKE 1 TABLET BY MOUTH EVERY 12 HOURS AS NEEDED FOR SEVERE PAIN 9SCORE 7-10)     Not Delegated - Analgesics:  Opioid Agonists Failed - 07/10/2023  4:02 PM      Failed - This refill cannot be delegated      Failed - Urine Drug Screen completed in last 360 days      Passed - Valid encounter within last 3 months    Recent Outpatient Visits           2 months ago Hospital discharge follow-up   [facility], [name] J, NP   4 months ago Medication refill   [facility], [name] J, NP   9 months ago Asthma, unspecified asthma severity, unspecified whether complicated, unspecified whether persistent   [place] [facility] Care at [facility], [name] J, NP   1 year ago Chronic frontal sinusitis   [place] [facility] at [facility], [name] J, NP   1 year ago Chronic frontal sinusitis   [place] [facility] at [facility], [name] J, NP               meloxicam (MOBIC) 15 MG tablet [Pharmacy Med Name: MELOXICAM 15 MG TABLET] 90 tablet 1    Sig: TAKE 1 TABLET BY MOUTH EVERY DAY     Analgesics:  COX2 Inhibitors Failed - 07/10/2023  4:02 PM      Failed - Manual Review: Labs are only required if the patient has taken medication for more than 8 weeks.      Failed - Cr in normal  range and within 360 days    Creatinine, Ser  Date Value Ref Range Status  06/25/2023 1.21 (H) 0.57 - 1.00 mg/dL Final         Passed - HGB in normal range and within 360 days    Hemoglobin  Date Value Ref Range Status  06/25/2023 11.6 11.1 - 15.9 g/dL Final         Passed - HCT in normal range and within 360 days    Hematocrit  Date Value Ref Range Status  06/25/2023 36.6 34.0 - 46.6 % Final         Passed - AST in normal range and within 360 days    AST  Date Value Ref Range Status  06/25/2023 11 0 - 40 IU/L Final         Passed - ALT in normal range and within 360 days    ALT  Date Value Ref Range Status  06/25/2023 6 0 - 32 IU/L Final         Passed - eGFR is 30 or above and within 360 days    GFR calc Af Denyse Dago  Date Value  Ref Range Status  08/08/2019 >60 >60 mL/min Final   GFR, Estimated  Date Value Ref Range Status  03/05/2023 53 (L) >60 mL/min Final    Comment:    (NOTE) Calculated using the CKD-EPI Creatinine Equation (2021)    eGFR  Date Value Ref Range Status  06/25/2023 50 (L) >59 mL/min/1.73 Final         Passed - Patient is not pregnant      Passed - Valid encounter within last 12 months    Recent Outpatient Visits           2 months ago Hospital discharge follow-up   [facility] at [facility], [name] J, NP   4 months ago Medication refill   [place] [facility] at [facility], [name] J, NP   9 months ago Asthma, unspecified asthma severity, unspecified whether complicated, unspecified whether persistent   [place] Primary Care at [facility], [name] J, NP   1 year ago Chronic frontal sinusitis   [place] [facility] Care at [facility], [name] J, NP   1 year ago Chronic frontal sinusitis    Primary Care at [facility], Salomon Fick, NP

## 2023-07-11 ENCOUNTER — Other Ambulatory Visit: Payer: Self-pay | Admitting: Family

## 2023-07-11 DIAGNOSIS — G8929 Other chronic pain: Secondary | ICD-10-CM

## 2023-07-11 MED ORDER — TRAMADOL HCL 50 MG PO TABS
50.0000 mg | ORAL_TABLET | Freq: Two times a day (BID) | ORAL | 0 refills | Status: DC | PRN
Start: 2023-07-11 — End: 2023-07-20

## 2023-07-11 NOTE — Telephone Encounter (Signed)
-   Tramadol prescribed.  - Meloxicam not advised due to decreased kidney function.

## 2023-07-11 NOTE — Telephone Encounter (Signed)
 Call patient with update.   Provide patient with referrals contact information from recent office visit on 06/25/2023. Follow-up with primary provider as scheduled. During the interim report to the Emergency Department/Urgent Care/call 911 for immediate medical evaluation.

## 2023-07-11 NOTE — Telephone Encounter (Signed)
 Requested medication (s) are due for refill today: na   Requested medication (s) are on the active medication list: yes - tramadol, no - meloxicam  Last refill:  05/04/23 #10 0 refills  Future visit scheduled: yes 08/07/23  Notes to clinic:  not delegated per protocol- tramadol, meloxicam- historical medication . Do you want to refill tramadol? Do you want to order meloxicam?     Requested Prescriptions  Pending Prescriptions Disp Refills   traMADol (ULTRAM) 50 MG tablet [Pharmacy Med Name: TRAMADOL HCL 50 MG TABLET] 10 tablet 0    Sig: TAKE 1 TABLET BY MOUTH EVERY 12 HOURS AS NEEDED FOR SEVERE PAIN 9SCORE 7-10)     Not Delegated - Analgesics:  Opioid Agonists Failed - 07/11/2023  8:11 AM      Failed - This refill cannot be delegated      Failed - Urine Drug Screen completed in last 360 days      Passed - Valid encounter within last 3 months    Recent Outpatient Visits           2 months ago Hospital discharge follow-up   [facility] at [facility], [name] J, NP   4 months ago Medication refill   [facility] at [facility], [name] J, NP   9 months ago Asthma, unspecified asthma severity, unspecified whether complicated, unspecified whether persistent   [place] [facility] Care at [facility], [name] J, NP   1 year ago Chronic frontal sinusitis   [place] [facility] Care at [facility], [name] J, NP   1 year ago Chronic frontal sinusitis   [place] Primary Care at [facility], [name] J, NP               meloxicam (MOBIC) 15 MG tablet [Pharmacy Med Name: MELOXICAM 15 MG TABLET] 90 tablet 1    Sig: TAKE 1 TABLET BY MOUTH EVERY DAY     Analgesics:  COX2 Inhibitors Failed - 07/11/2023  8:11 AM      Failed - Manual Review: Labs are only required if the patient has taken medication for more than 8 weeks.      Failed - Cr in normal range and within 360 days    Creatinine, Ser  Date Value Ref Range Status   06/25/2023 1.21 (H) 0.57 - 1.00 mg/dL Final         Passed - HGB in normal range and within 360 days    Hemoglobin  Date Value Ref Range Status  06/25/2023 11.6 11.1 - 15.9 g/dL Final         Passed - HCT in normal range and within 360 days    Hematocrit  Date Value Ref Range Status  06/25/2023 36.6 34.0 - 46.6 % Final         Passed - AST in normal range and within 360 days    AST  Date Value Ref Range Status  06/25/2023 11 0 - 40 IU/L Final         Passed - ALT in normal range and within 360 days    ALT  Date Value Ref Range Status  06/25/2023 6 0 - 32 IU/L Final         Passed - eGFR is 30 or above and within 360 days    GFR calc Af Amer  Date Value Ref Range Status  08/08/2019 >60 >60 mL/min Final   GFR, Estimated  Date Value Ref  Range Status  03/05/2023 53 (L) >60 mL/min Final    Comment:    (NOTE) Calculated using the CKD-EPI Creatinine Equation (2021)    eGFR  Date Value Ref Range Status  06/25/2023 50 (L) >59 mL/min/1.73 Final         Passed - Patient is not pregnant      Passed - Valid encounter within last 12 months    Recent Outpatient Visits           2 months ago Hospital discharge follow-up   [facility] at [facility], [name] J, NP   4 months ago Medication refill   [place] Primary Care at [facility], [name] J, NP   9 months ago Asthma, unspecified asthma severity, unspecified whether complicated, unspecified whether persistent   [place] Primary Care at [facility], [name] J, NP   1 year ago Chronic frontal sinusitis   [place] Primary Care at [facility], [name] J, NP   1 year ago Chronic frontal sinusitis   [place] Primary Care at [facility], Salomon Fick, NP

## 2023-07-13 NOTE — Telephone Encounter (Signed)
 I have attempted without success to contact this patient by phone to return their call and I left a message on answering machine.

## 2023-07-13 NOTE — Telephone Encounter (Signed)
-   Tramadol prescribed 07/11/2023.  - Meloxicam not advised due to decreased kidney function.

## 2023-07-16 NOTE — Telephone Encounter (Signed)
-   Tramadol prescribed 07/11/2023.  - Meloxicam not advised due to decreased kidney function.

## 2023-07-18 NOTE — Telephone Encounter (Signed)
 Noted.

## 2023-07-20 ENCOUNTER — Ambulatory Visit: Payer: Self-pay | Admitting: *Deleted

## 2023-07-20 ENCOUNTER — Other Ambulatory Visit: Payer: Self-pay | Admitting: Family

## 2023-07-20 DIAGNOSIS — G8929 Other chronic pain: Secondary | ICD-10-CM

## 2023-07-20 NOTE — Telephone Encounter (Signed)
 Requested medication (s) are due for refill today - unsure  Requested medication (s) are on the active medication list - yes  Future visit scheduled -yes  Last refill: 07/11/23 #10  Notes to clinic: non delegated Rx  Requested Prescriptions  Pending Prescriptions Disp Refills   traMADol  (ULTRAM ) 50 MG tablet 10 tablet 0    Sig: Take 1 tablet (50 mg total) by mouth every 12 (twelve) hours as needed for severe pain (pain score 7-10).     Not Delegated - Analgesics:  Opioid Agonists Failed - 07/20/2023  2:15 PM      Failed - This refill cannot be delegated      Failed - Urine Drug Screen completed in last 360 days      Passed - Valid encounter within last 3 months    Recent Outpatient Visits           2 months ago Hospital discharge follow-up   [facility], [name] J, NP   5 months ago Medication refill   [place] [facility] at [facility], [name] J, NP   10 months ago Asthma, unspecified asthma severity, unspecified whether complicated, unspecified whether persistent   [place] [facility] at [facility], [name] J, NP   1 year ago Chronic frontal sinusitis   [place] [facility] Care at [facility], [name] J, NP   2 years ago Chronic frontal sinusitis   [place] [facility] Care at [facility], [name] J, NP                 Requested Prescriptions  Pending Prescriptions Disp Refills   traMADol  (ULTRAM ) 50 MG tablet 10 tablet 0    Sig: Take 1 tablet (50 mg total) by mouth every 12 (twelve) hours as needed for severe pain (pain score 7-10).     Not Delegated - Analgesics:  Opioid Agonists Failed - 07/20/2023  2:15 PM      Failed - This refill cannot be delegated      Failed - Urine Drug Screen completed in last 360 days      Passed - Valid encounter within last 3 months    Recent Outpatient Visits           2 months ago Hospital discharge follow-up   [facility] at [facility], [place], NP   5 months ago Medication refill   [place] [facility] Care at [facility], [name] J, NP   10 months ago Asthma, unspecified asthma severity, unspecified whether complicated, unspecified whether persistent   [place] [facility] Care at [facility], [place], NP   1 year ago Chronic frontal sinusitis   [place] [facility] Care at [facility], [name] J, NP   2 years ago Chronic frontal sinusitis   [place] Primary Care at [facility], Annalee Barren, NP

## 2023-07-20 NOTE — Telephone Encounter (Signed)
 Copied from CRM [id number]. Topic: Clinical - Medication Refill >> Jul 20, 2023  2:02 PM Turkey B wrote: Most Recent Primary Care Visit:  Provider: Lavona Pounds J  Department: [facility]  Visit Type: ANNUAL WELL VISIT, INITIAL  Date: 06/25/2023  Medication: traMADol  (ULTRAM ) 50 MG tablet  Has the patient contacted their pharmacy? No, is using pharmacy not listed on her chart  Is this the correct pharmacy for this prescription? yes This is the patient's preferred pharmacy:  [facility] [address], [place],  [place] [id number] Has the prescription been filled recently? no  Is the patient out of the medication? yes  Has the patient been seen for an appointment in the last year OR does the patient have an upcoming appointment? yes  Can we respond through MyChart? yes  Agent: Please be advised that Rx refills may take up to 3 business days. We ask that you follow-up with your pharmacy.
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
## 2023-07-24 NOTE — Telephone Encounter (Signed)
 [name] are you able to refer to psychiatrist?   I have attempted to contact this patient by phone with the following results: no answer.

## 2023-07-25 ENCOUNTER — Other Ambulatory Visit: Payer: Self-pay | Admitting: Family

## 2023-07-25 DIAGNOSIS — F419 Anxiety disorder, unspecified: Secondary | ICD-10-CM

## 2023-07-25 MED ORDER — TRAMADOL HCL 50 MG PO TABS: 50.0000 mg | ORAL_TABLET | Freq: Two times a day (BID) | ORAL | 0 refills | Status: DC | PRN

## 2023-07-25 NOTE — Telephone Encounter (Signed)
 Complete

## 2023-07-25 NOTE — Telephone Encounter (Signed)
 I called patient back ton tell her PCP recommendation however phone went to voicemail and I was unable to give a message.

## 2023-07-25 NOTE — Telephone Encounter (Signed)
 Report to Emergency Department/Urgent Care/call 911 for immediate medical evaluation. Follow-up with Primary Care.

## 2023-07-26 ENCOUNTER — Ambulatory Visit: Payer: Self-pay

## 2023-07-26 ENCOUNTER — Other Ambulatory Visit: Payer: Self-pay

## 2023-07-26 ENCOUNTER — Emergency Department (HOSPITAL_COMMUNITY)
Admission: EM | Admit: 2023-07-26 | Discharge: 2023-07-26 | Disposition: A | Discharge status: Home / Self Care | Attending: Emergency Medicine | Admitting: Emergency Medicine

## 2023-07-26 ENCOUNTER — Other Ambulatory Visit: Payer: Self-pay | Admitting: Family

## 2023-07-26 DIAGNOSIS — R531 Weakness: Secondary | ICD-10-CM | POA: Diagnosis present

## 2023-07-26 DIAGNOSIS — G8929 Other chronic pain: Secondary | ICD-10-CM

## 2023-07-26 LAB — COMPREHENSIVE METABOLIC PANEL WITH GFR
ALT: 11 U/L (ref 0–44)
AST: 11 U/L — ABNORMAL LOW (ref 15–41)
Albumin: 3.8 g/dL (ref 3.5–5.0)
Alkaline Phosphatase: 73 U/L (ref 38–126)
Anion gap: 9 (ref 5–15)
BUN: 14 mg/dL (ref 8–23)
CO2: 36 mmol/L — ABNORMAL HIGH (ref 22–32)
Calcium: 10.3 mg/dL (ref 8.9–10.3)
Chloride: 94 mmol/L — ABNORMAL LOW (ref 98–111)
Creatinine, Ser: 1.03 mg/dL — ABNORMAL HIGH (ref 0.44–1.00)
GFR, Estimated: 60 mL/min — ABNORMAL LOW (ref >60)
Glucose, Bld: 103 mg/dL — ABNORMAL HIGH (ref 70–99)
Potassium: 4.3 mmol/L (ref 3.5–5.1)
Sodium: 139 mmol/L (ref 135–145)
Total Bilirubin: 0.9 mg/dL (ref 0.0–1.2)
Total Protein: 7.7 g/dL (ref 6.5–8.1)

## 2023-07-26 LAB — CBC WITH DIFFERENTIAL/PLATELET
Abs Immature Granulocytes: 0.07 10*3/uL (ref 0.00–0.07)
Basophils Absolute: 0 10*3/uL (ref 0.0–0.1)
Basophils Relative: 0 %
Eosinophils Absolute: 0.1 10*3/uL (ref 0.0–0.5)
Eosinophils Relative: 1 %
HCT: 39.2 % (ref 36.0–46.0)
Hemoglobin: 11.5 g/dL — ABNORMAL LOW (ref 12.0–15.0)
Immature Granulocytes: 1 %
Lymphocytes Relative: 28 %
Lymphs Abs: 2.8 10*3/uL (ref 0.7–4.0)
MCH: 28.2 pg (ref 26.0–34.0)
MCHC: 29.3 g/dL — ABNORMAL LOW (ref 30.0–36.0)
MCV: 96.1 fL (ref 80.0–100.0)
Monocytes Absolute: 0.8 10*3/uL (ref 0.1–1.0)
Monocytes Relative: 8 %
Neutro Abs: 6.2 10*3/uL (ref 1.7–7.7)
Neutrophils Relative %: 62 %
Platelets: 292 10*3/uL (ref 150–400)
RBC: 4.08 MIL/uL (ref 3.87–5.11)
RDW: 12.5 % (ref 11.5–15.5)
WBC: 10 10*3/uL (ref 4.0–10.5)
nRBC: 0 % (ref 0.0–0.2)

## 2023-07-26 LAB — TROPONIN I (HIGH SENSITIVITY): Troponin I (High Sensitivity): 6 ng/L (ref <18)

## 2023-07-26 MED ORDER — MIRTAZAPINE 15 MG PO TABS: 15.0000 mg | ORAL_TABLET | Freq: Every day | ORAL | 0 refills | Status: DC

## 2023-07-26 MED ORDER — DOXEPIN HCL 50 MG PO CAPS: 50.0000 mg | ORAL_CAPSULE | Freq: Every morning | ORAL | 0 refills | Status: DC

## 2023-07-26 MED ORDER — TRAMADOL HCL 50 MG PO TABS
50.0000 mg | ORAL_TABLET | Freq: Once | ORAL | Status: AC
  Administered 2023-07-26: 50 mg via ORAL
  Filled 2023-07-26: qty 1

## 2023-07-26 MED ORDER — TRAMADOL HCL 50 MG PO TABS: 50.0000 mg | ORAL_TABLET | Freq: Two times a day (BID) | ORAL | 0 refills | Status: DC | PRN

## 2023-07-26 MED ORDER — LORAZEPAM 1 MG PO TABS
1.0000 mg | ORAL_TABLET | Freq: Once | ORAL | Status: AC
  Administered 2023-07-26: 1 mg via ORAL
  Filled 2023-07-26: qty 1

## 2023-07-26 NOTE — Telephone Encounter (Signed)
 Complete

## 2023-07-26 NOTE — Telephone Encounter (Signed)
 Tramadol  prescribed 07/26/2023 4:25 AM EDT from Mandy Second, PA-C.
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
## 2023-07-26 NOTE — Discharge Instructions (Signed)
 Follow up with your doctor for recheck and further medication refills.

## 2023-07-26 NOTE — ED Triage Notes (Signed)
 Pt BIB [facility] from home. Pt reports being [facility] after family dispute last night. Pt c/o back pain, nausea and dizziness. Upon ems arrival pt diaphoretic and pale. Pt home O2 2L [place]. 138/72 84 20 96% 4L [place]

## 2023-07-26 NOTE — Telephone Encounter (Signed)
 No triage: Pt difficult. Wanted Tramadol  refilled from ED visit.Discharged this morning. Pt didn't want to repeat info to RN. Wants to speak to someone who is familiar with care. RN called [facility] and was hold for too long. Pt hung up. Please advise.            Copied from CRM [id number]. Topic: Clinical - Red Word Triage >> Jul 26, 2023 11:07 AM Rennis Case wrote: Red Word that prompted transfer to Nurse Triage: out of wack emotionally, has manic depression  In a lot of pain, requesting tramadol  Reason for Disposition  [1] Caller demands to speak with the PCP AND [2] about sick adult (or sick caller)  Answer Assessment - Initial Assessment Questions 1. SITUATION:  Document reason for call.     Med refill 2. BACKGROUND: Document any background information (e.g., prior calls, known psychiatric history)     Manic depression  3. ASSESSMENT: Document your nursing assessment.     Pt left ED earlier. Pt is upset about med refill. Doesn't want to repeat history 4. RESPONSE: Document what your response or recommendation was.     Transferred to [facility] per pt request  Protocols used: Difficult Call-A-AH

## 2023-07-26 NOTE — ED Notes (Signed)
 07/26/23 1055 pt called stating the pharmacy would not fill her prescription for Tramadol  due to being too early. She wanted us  to call and get it filled. She was encouraged to call her PCP since the writer of the prescriptions is no longer here. She requested to talk to administration, Clinical research associate offered her Denese Finn RN ED director number. She stated she will call the administrator.

## 2023-08-01 ENCOUNTER — Telehealth: Payer: Self-pay | Admitting: Family

## 2023-08-01 NOTE — Telephone Encounter (Signed)
Placed in providers folder for review.

## 2023-08-01 NOTE — Telephone Encounter (Signed)
Placed in provider's box for review.

## 2023-08-02 NOTE — Telephone Encounter (Signed)
 I gave to PCP on 08/02/2023 to sign

## 2023-08-06 NOTE — Telephone Encounter (Signed)
 I faxed back on 08/06/2023

## 2023-08-07 ENCOUNTER — Encounter: Admitting: Family

## 2023-08-07 ENCOUNTER — Other Ambulatory Visit: Payer: Self-pay | Admitting: Family

## 2023-08-07 ENCOUNTER — Telehealth: Payer: Self-pay | Admitting: Emergency Medicine

## 2023-08-07 DIAGNOSIS — G8929 Other chronic pain: Secondary | ICD-10-CM

## 2023-08-07 NOTE — Telephone Encounter (Signed)
 Message appears blank. Please let me know how I can assist patient. Thank you.

## 2023-08-07 NOTE — Progress Notes (Signed)
 Erroneous encounter-disregard

## 2023-08-07 NOTE — Telephone Encounter (Signed)
 Copied from CRM [id number]. Topic: Clinical - Medication Refill >> Aug 07, 2023  9:47 AM Juluis Ok wrote: Most Recent Primary Care Visit:  Provider: Lavona Pounds J  Department: [facility]  Visit Type: ANNUAL WELL VISIT, INITIAL  Date: 06/25/2023  Medication:  diclofenac  (VOLTAREN ) 75 MG EC tablet   Has the patient contacted their pharmacy? Yes (Agent: If no, request that the patient contact the pharmacy for the refill. If patient does not wish to contact the pharmacy document the reason why and proceed with request.) (Agent: If yes, when and what did the pharmacy advise?)  Is this the correct pharmacy for this prescription? Yes If no, delete pharmacy and type the correct one.  This is the patient's preferred pharmacy:    [facility] [address], [place] - [address] [place] [address] Phone: [phone] Fax: [phone]   Has the prescription been filled recently? Yes  Is the patient out of the medication? Yes  Has the patient been seen for an appointment in the last year OR does the patient have an upcoming appointment? Yes  Can we respond through MyChart? No  Agent: Please be advised that Rx refills may take up to 3 business days. We ask that you follow-up with your pharmacy.

## 2023-08-08 ENCOUNTER — Other Ambulatory Visit: Payer: Self-pay | Admitting: Family

## 2023-08-08 DIAGNOSIS — G8929 Other chronic pain: Secondary | ICD-10-CM

## 2023-08-11 ENCOUNTER — Other Ambulatory Visit: Payer: Self-pay | Admitting: Family

## 2023-08-11 DIAGNOSIS — G8929 Other chronic pain: Secondary | ICD-10-CM

## 2023-08-13 ENCOUNTER — Encounter: Admitting: Family

## 2023-08-13 NOTE — Progress Notes (Signed)
 Erroneous encounter-disregard

## 2023-08-13 NOTE — Telephone Encounter (Signed)
 Addendum: Allergy list states Gabapentin  causes "has un-controlled movement of her body, excessive dribble of saliva at night". Please call patient to confirm prior to refilling. Thank you.

## 2023-08-13 NOTE — Telephone Encounter (Signed)
 I called patient to find out about reaction to medication no one answered and I could not leave a voicemail.

## 2023-08-13 NOTE — Telephone Encounter (Signed)
 Complete

## 2023-08-14 NOTE — Telephone Encounter (Signed)
 I called patient about medication and no one answered and was unable to leave a voicemail

## 2023-08-15 ENCOUNTER — Other Ambulatory Visit: Payer: Self-pay | Admitting: Family

## 2023-08-15 DIAGNOSIS — G8929 Other chronic pain: Secondary | ICD-10-CM

## 2023-08-15 NOTE — Telephone Encounter (Unsigned)
 Copied from CRM [id number]. Topic: Clinical - Medication Refill >> Aug 15, 2023 11:26 AM Rosaria Common wrote: Medication: gabapentin  (NEURONTIN ) 300 MG capsule  Has the patient contacted their pharmacy? Yes (Agent: If no, request that the patient contact the pharmacy for the refill. If patient does not wish to contact the pharmacy document the reason why and proceed with request.) (Agent: If yes, when and what did the pharmacy advise?)  This is the patient's preferred pharmacy:  [id number] - [address] [place] [address] Phone: [phone] Fax: [phone]  Is this the correct pharmacy for this prescription? Yes If no, delete pharmacy and type the correct one.   Has the prescription been filled recently? Yes  Is the patient out of the medication? No  Has the patient been seen for an appointment in the last year OR does the patient have an upcoming appointment? Yes  Can we respond through MyChart? Yes  Agent: Please be advised that Rx refills may take up to 3 business days. We ask that you follow-up with your pharmacy.

## 2023-08-15 NOTE — Telephone Encounter (Signed)
 I called patient about medication and one answered and I was unable to leave a voicemail

## 2023-08-16 NOTE — Telephone Encounter (Signed)
 Requested medications are due for refill today.  unsure  Requested medications are on the active medications list.  yes  Last refill. 05/04/2023 #90 0 rf  Future visit scheduled.   yes  Notes to clinic.  Rx written to expire 08/02/2023 - Rx is expired.    Requested Prescriptions  Pending Prescriptions Disp Refills   gabapentin  (NEURONTIN ) 300 MG capsule 90 capsule 0    Sig: Take 1 capsule (300 mg total) by mouth at bedtime.     Neurology: Anticonvulsants - gabapentin  Failed - 08/16/2023  3:21 PM      Failed - Cr in normal range and within 360 days    Creatinine, Ser  Date Value Ref Range Status  07/26/2023 1.03 (H) 0.44 - 1.00 mg/dL Final         Failed - Completed PHQ-2 or PHQ-9 in the last 360 days      Passed - Valid encounter within last 12 months    Recent Outpatient Visits           3 months ago Hospital discharge follow-up   [facility], [name] J, NP   5 months ago Medication refill   [facility], [name] J, NP   11 months ago Asthma, unspecified asthma severity, unspecified whether complicated, unspecified whether persistent   [place] [facility], [place], NP   1 year ago Chronic frontal sinusitis   [place] [facility], [name] J, NP   2 years ago Chronic frontal sinusitis   [place] [facility], Annalee Barren, NP

## 2023-08-20 ENCOUNTER — Telehealth: Payer: Self-pay | Admitting: Family

## 2023-08-20 ENCOUNTER — Other Ambulatory Visit: Payer: Self-pay | Admitting: Family

## 2023-08-20 ENCOUNTER — Ambulatory Visit: Payer: Self-pay

## 2023-08-20 DIAGNOSIS — G8929 Other chronic pain: Secondary | ICD-10-CM

## 2023-08-20 MED ORDER — GABAPENTIN 300 MG PO CAPS
300.0000 mg | ORAL_CAPSULE | Freq: Every day | ORAL | 0 refills | Status: AC
Start: 2023-08-20 — End: 2023-11-18

## 2023-08-20 NOTE — Telephone Encounter (Signed)
 Copied from CRM [id number]. Topic: Clinical - Medication Refill >> Aug 15, 2023 11:26 AM Jalayah J wrote:  Medication: gabapentin  (NEURONTIN ) 300 MG capsule  Has the patient contacted their pharmacy? Yes (Agent: If no, request that the patient contact the pharmacy for the refill. If patient does not wish to contact the pharmacy document the reason why and proceed with request.) (Agent: If yes, when and what did the pharmacy advise?)  This is the patient's preferred pharmacy:  [id number] - [address] [place] [address] Phone: [phone] Fax: [phone]  Is this the correct pharmacy for this prescription? Yes If no, delete pharmacy and type the correct one.   Has the prescription been filled recently? Yes  Is the patient out of the medication? No  Has the patient been seen for an appointment in the last year OR does the patient have an upcoming appointment? Yes  Can we respond through MyChart? Yes  Agent: Please be advised that Rx refills may take up to 3 business days. We ask that you follow-up with your pharmacy. >> Aug 20, 2023  8:21 AM Cynthia K wrote: Makynzee called back and she is out of her gabapentin  (NEURONTIN ) 300 MG capsule. She said that her pharmacy is telling her that NP Rogerio Clay is declining refill.  Chart does show that this medication has been discontinued. Landra would like for NP Rogerio Clay' nurse to call her back as soon as possible at [phone] . Thank you.

## 2023-08-20 NOTE — Telephone Encounter (Signed)
 Called patient unable to leave vm due to full voicemail

## 2023-08-20 NOTE — Telephone Encounter (Signed)
  Chief Complaint: back pain Symptoms: back pain, knee pain, hand pain, constant  Frequency: ongoing but severe right now Pertinent Negatives: NA Disposition: [] ED /[] Urgent Care (no appt availability in office) / [] Appointment(In office/virtual)/ []  [place] [facility]/ [] Home Care/ [] Refused Recommended Disposition /[] [place] [place] [facility]/ [x]  Follow-up with PCP Additional Notes: pt requesting refill of gabapentin  and says she has been out for several days and unable to get OOB d/t pain right now. Reviewed refill request and advised pt of message from PCP. Pt states she has NEVER had any issues with gabapentin  and this is a daily medication she takes that helps with pain. Pt is asking if it can please be sent in today d/t pain. Advised I would send message to provider with update. [id number],  - [address]   Copied from CRM [id number]. Topic: Clinical - Red Word Triage >> Aug 20, 2023 10:36 AM Stanly Early wrote: Red Word that prompted transfer to Nurse Triage: knee and back pain is requesting for meds Reason for Disposition  [1] SEVERE back pain (e.g., excruciating, unable to do any normal activities) AND [2] not improved 2 hours after pain medicine  Answer Assessment - Initial Assessment Questions 1. ONSET: "When did the pain begin?"      Ongoing a long time  2. LOCATION: "Where does it hurt?" (upper, mid or lower back)     Lower back  3. SEVERITY: "How bad is the pain?"  (e.g., Scale 1-10; mild, moderate, or severe)   - MILD (1-3): Doesn't interfere with normal activities.    - MODERATE (4-7): Interferes with normal activities or awakens from sleep.    - SEVERE (8-10): Excruciating pain, unable to do any normal activities.      Severe pain now, having to stay in bed  4. PATTERN: "Is the pain constant?" (e.g., yes, no; constant, intermittent)      Constant  8. MEDICINES: "What have you taken so far for the pain?" (e.g., nothing,  acetaminophen , NSAIDS)     Nothing normally takes gabapentin   10. OTHER SYMPTOMS: "Do you have any other symptoms?" (e.g., fever, abdomen pain, burning with urination, blood in urine)       Hand pain as well  Protocols used: Back Pain-A-AH

## 2023-08-20 NOTE — Telephone Encounter (Signed)
 Complete

## 2023-08-20 NOTE — Telephone Encounter (Signed)
 Gabapentin /Neurontin  listed in patient's chart as an allergy with the following notation "Has un-controlled movement of her body, excessive dribble of saliva at night".

## 2023-08-21 ENCOUNTER — Telehealth: Payer: Self-pay | Admitting: Family

## 2023-08-21 ENCOUNTER — Other Ambulatory Visit: Payer: Self-pay | Admitting: Family

## 2023-08-21 DIAGNOSIS — G8929 Other chronic pain: Secondary | ICD-10-CM

## 2023-08-21 NOTE — Telephone Encounter (Signed)
 Complete

## 2023-08-21 NOTE — Telephone Encounter (Signed)
Called patient unable to make contact or leave voicemail  due to mailbox being full

## 2023-08-21 NOTE — Telephone Encounter (Signed)
 Copied from CRM [id number]. Topic: Clinical - Prescription Issue >> Aug 21, 2023 11:11 AM Janice Ryan wrote:  Reason for CRM: Pt called in to state picked a company "[employer]" been 6-7 weeks and have not done anything concerning personal care. if could authorize again and report that the company is not doing their part. if someone can be picked other than this company.    Pt would like for someone to call her about these home health order and help her pick another location. She is not happy about this process.    Pt callback [phone]

## 2023-08-22 NOTE — Telephone Encounter (Signed)
 I called patient twice and the voicemail was full.    I want to let her know that she needs to call  [facility] : [phone] and request a new PCS provider.

## 2023-08-23 NOTE — Telephone Encounter (Signed)
 I spoke to the patient and she explained that she was approved for 6.5 hours/ week of PCS but has not been able to secure an agency to provide this service.  She said she called [facility] and they told her they have about 20 providers in network and would send her a list.  She said she has not received a list yet.  I explained to her that the home health providers with [facility] are different than the [facility] providers. I went on to explain that Medicaid provides PCS and she needs to call [facility] : [phone] and request a new PCS provider.  She was very appreciative of the information and said she will call.  I told her to please call me back if she has any questions.

## 2023-08-29 ENCOUNTER — Encounter: Admitting: Family

## 2023-08-29 NOTE — Progress Notes (Signed)
 Erroneous encounter-disregard

## 2023-08-30 ENCOUNTER — Telehealth: Payer: Self-pay | Admitting: Family

## 2023-08-30 NOTE — Telephone Encounter (Signed)
 A document form from [employer] has been faxed: confirmation of order and letter of medical necessity , to be filled out by provider. Send document back via Fax within 7-days. Document is located in providers tray at front office.          Fax number: [phone]

## 2023-09-03 ENCOUNTER — Other Ambulatory Visit: Payer: Self-pay | Admitting: Family

## 2023-09-03 ENCOUNTER — Telehealth: Payer: Self-pay | Admitting: Family

## 2023-09-03 DIAGNOSIS — G629 Polyneuropathy, unspecified: Secondary | ICD-10-CM

## 2023-09-03 NOTE — Telephone Encounter (Signed)
 Copied from CRM [id number]. Topic: Referral - Request for Referral >> Sep 03, 2023  2:14 PM Ethelle Herb L wrote: Did the patient discuss referral with their provider in the last year? Yes (If No - schedule appointment) (If Yes - send message)  Appointment offered? No  Type of order/referral and detailed reason for visit: Numbness in right side of body, hip, hand, arm & sleep study, Neurology  Preference of office, provider, location: [facility] office in [place]  If referral order, have you been seen by this specialty before? No  Can we respond through MyChart? Yes

## 2023-09-03 NOTE — Telephone Encounter (Signed)
 Complete

## 2023-09-04 ENCOUNTER — Other Ambulatory Visit: Payer: Self-pay | Admitting: Family

## 2023-09-04 DIAGNOSIS — G8929 Other chronic pain: Secondary | ICD-10-CM

## 2023-09-04 NOTE — Telephone Encounter (Signed)
 Gabapentin  prescribed 08/20/2023 for 90 day supply.

## 2023-09-06 NOTE — Telephone Encounter (Signed)
 Gave to pcp on 09/06/2023

## 2023-09-07 ENCOUNTER — Encounter: Payer: Self-pay | Admitting: Family

## 2023-09-07 NOTE — Telephone Encounter (Signed)
 PCP did not sign paperwork patient did not come to schedule appointment

## 2023-09-10 ENCOUNTER — Other Ambulatory Visit: Payer: Self-pay | Admitting: Family

## 2023-09-10 ENCOUNTER — Emergency Department (HOSPITAL_COMMUNITY)

## 2023-09-10 ENCOUNTER — Other Ambulatory Visit: Payer: Self-pay

## 2023-09-10 ENCOUNTER — Ambulatory Visit: Admission: EM | Admit: 2023-09-10 | Discharge: 2023-09-10 | Disposition: A | Discharge status: Home / Self Care

## 2023-09-10 ENCOUNTER — Observation Stay (HOSPITAL_COMMUNITY)
Admission: EM | Admit: 2023-09-10 | Discharge: 2023-09-13 | Disposition: A | Discharge status: Home / Self Care | Attending: Internal Medicine | Admitting: Internal Medicine

## 2023-09-10 ENCOUNTER — Encounter: Admitting: Family

## 2023-09-10 ENCOUNTER — Encounter (HOSPITAL_COMMUNITY): Payer: Self-pay | Admitting: Emergency Medicine

## 2023-09-10 ENCOUNTER — Telehealth: Payer: Self-pay | Admitting: Family

## 2023-09-10 DIAGNOSIS — Z79899 Other long term (current) drug therapy: Secondary | ICD-10-CM | POA: Insufficient documentation

## 2023-09-10 DIAGNOSIS — Z6841 Body Mass Index (BMI) 40.0 and over, adult: Secondary | ICD-10-CM | POA: Insufficient documentation

## 2023-09-10 DIAGNOSIS — J45901 Unspecified asthma with (acute) exacerbation: Secondary | ICD-10-CM | POA: Insufficient documentation

## 2023-09-10 DIAGNOSIS — R55 Syncope and collapse: Secondary | ICD-10-CM

## 2023-09-10 DIAGNOSIS — W19XXXA Unspecified fall, initial encounter: Secondary | ICD-10-CM

## 2023-09-10 DIAGNOSIS — R079 Chest pain, unspecified: Secondary | ICD-10-CM

## 2023-09-10 DIAGNOSIS — I130 Hypertensive heart and chronic kidney disease with heart failure and stage 1 through stage 4 chronic kidney disease, or unspecified chronic kidney disease: Secondary | ICD-10-CM | POA: Insufficient documentation

## 2023-09-10 DIAGNOSIS — Z8659 Personal history of other mental and behavioral disorders: Secondary | ICD-10-CM | POA: Insufficient documentation

## 2023-09-10 DIAGNOSIS — G8929 Other chronic pain: Secondary | ICD-10-CM

## 2023-09-10 DIAGNOSIS — R519 Headache, unspecified: Secondary | ICD-10-CM | POA: Insufficient documentation

## 2023-09-10 DIAGNOSIS — D259 Leiomyoma of uterus, unspecified: Secondary | ICD-10-CM | POA: Diagnosis not present

## 2023-09-10 DIAGNOSIS — F39 Unspecified mood [affective] disorder: Secondary | ICD-10-CM

## 2023-09-10 DIAGNOSIS — G4733 Obstructive sleep apnea (adult) (pediatric): Secondary | ICD-10-CM | POA: Diagnosis not present

## 2023-09-10 DIAGNOSIS — I5030 Unspecified diastolic (congestive) heart failure: Secondary | ICD-10-CM | POA: Insufficient documentation

## 2023-09-10 DIAGNOSIS — M25552 Pain in left hip: Secondary | ICD-10-CM | POA: Insufficient documentation

## 2023-09-10 DIAGNOSIS — N1831 Chronic kidney disease, stage 3a: Secondary | ICD-10-CM | POA: Diagnosis not present

## 2023-09-10 DIAGNOSIS — Z9981 Dependence on supplemental oxygen: Secondary | ICD-10-CM

## 2023-09-10 DIAGNOSIS — R0602 Shortness of breath: Secondary | ICD-10-CM | POA: Diagnosis not present

## 2023-09-10 DIAGNOSIS — I700 Atherosclerosis of aorta: Secondary | ICD-10-CM | POA: Insufficient documentation

## 2023-09-10 DIAGNOSIS — Z1152 Encounter for screening for COVID-19: Secondary | ICD-10-CM | POA: Insufficient documentation

## 2023-09-10 DIAGNOSIS — J4520 Mild intermittent asthma, uncomplicated: Secondary | ICD-10-CM

## 2023-09-10 DIAGNOSIS — Z1231 Encounter for screening mammogram for malignant neoplasm of breast: Secondary | ICD-10-CM

## 2023-09-10 DIAGNOSIS — J189 Pneumonia, unspecified organism: Secondary | ICD-10-CM | POA: Diagnosis not present

## 2023-09-10 DIAGNOSIS — M25551 Pain in right hip: Secondary | ICD-10-CM

## 2023-09-10 DIAGNOSIS — W1830XA Fall on same level, unspecified, initial encounter: Secondary | ICD-10-CM | POA: Insufficient documentation

## 2023-09-10 LAB — CBC WITH DIFFERENTIAL/PLATELET
Abs Immature Granulocytes: 0.22 10*3/uL — ABNORMAL HIGH (ref 0.00–0.07)
Basophils Absolute: 0.1 10*3/uL (ref 0.0–0.1)
Basophils Relative: 1 %
Eosinophils Absolute: 0.1 10*3/uL (ref 0.0–0.5)
Eosinophils Relative: 1 %
HCT: 37.3 % (ref 36.0–46.0)
Hemoglobin: 11.3 g/dL — ABNORMAL LOW (ref 12.0–15.0)
Immature Granulocytes: 1 %
Lymphocytes Relative: 21 %
Lymphs Abs: 3.2 10*3/uL (ref 0.7–4.0)
MCH: 29 pg (ref 26.0–34.0)
MCHC: 30.3 g/dL (ref 30.0–36.0)
MCV: 95.9 fL (ref 80.0–100.0)
Monocytes Absolute: 1.4 10*3/uL — ABNORMAL HIGH (ref 0.1–1.0)
Monocytes Relative: 9 %
Neutro Abs: 10.2 10*3/uL — ABNORMAL HIGH (ref 1.7–7.7)
Neutrophils Relative %: 67 %
Platelets: 323 10*3/uL (ref 150–400)
RBC: 3.89 MIL/uL (ref 3.87–5.11)
RDW: 12 % (ref 11.5–15.5)
WBC: 15.2 10*3/uL — ABNORMAL HIGH (ref 4.0–10.5)
nRBC: 0 % (ref 0.0–0.2)

## 2023-09-10 LAB — COMPREHENSIVE METABOLIC PANEL WITH GFR
ALT: 12 U/L (ref 0–44)
AST: 11 U/L — ABNORMAL LOW (ref 15–41)
Albumin: 3.1 g/dL — ABNORMAL LOW (ref 3.5–5.0)
Alkaline Phosphatase: 76 U/L (ref 38–126)
Anion gap: 12 (ref 5–15)
BUN: 20 mg/dL (ref 8–23)
CO2: 30 mmol/L (ref 22–32)
Calcium: 9 mg/dL (ref 8.9–10.3)
Chloride: 96 mmol/L — ABNORMAL LOW (ref 98–111)
Creatinine, Ser: 1.07 mg/dL — ABNORMAL HIGH (ref 0.44–1.00)
GFR, Estimated: 57 mL/min — ABNORMAL LOW (ref >60)
Glucose, Bld: 88 mg/dL (ref 70–99)
Potassium: 4.2 mmol/L (ref 3.5–5.1)
Sodium: 138 mmol/L (ref 135–145)
Total Bilirubin: 0.7 mg/dL (ref 0.0–1.2)
Total Protein: 6.8 g/dL (ref 6.5–8.1)

## 2023-09-10 LAB — BRAIN NATRIURETIC PEPTIDE: B Natriuretic Peptide: 93.1 pg/mL (ref 0.0–100.0)

## 2023-09-10 LAB — RESP PANEL BY RT-PCR (RSV, FLU A&B, COVID)  RVPGX2
Influenza A by PCR: NEGATIVE
Influenza B by PCR: NEGATIVE
Resp Syncytial Virus by PCR: NEGATIVE
SARS Coronavirus 2 by RT PCR: NEGATIVE

## 2023-09-10 LAB — TROPONIN I (HIGH SENSITIVITY)
Troponin I (High Sensitivity): 6 ng/L (ref <18)
Troponin I (High Sensitivity): 5 ng/L (ref <18)

## 2023-09-10 MED ORDER — METHYLPREDNISOLONE SODIUM SUCC 125 MG IJ SOLR
125.0000 mg | Freq: Once | INTRAMUSCULAR | Status: AC
  Administered 2023-09-10: 125 mg via INTRAVENOUS
  Filled 2023-09-10: qty 2

## 2023-09-10 MED ORDER — IPRATROPIUM-ALBUTEROL 0.5-2.5 (3) MG/3ML IN SOLN
3.0000 mL | Freq: Once | RESPIRATORY_TRACT | Status: AC
  Administered 2023-09-10: 3 mL via RESPIRATORY_TRACT
  Filled 2023-09-10: qty 3

## 2023-09-10 MED ORDER — SODIUM CHLORIDE 0.9 % IV SOLN
500.0000 mg | Freq: Once | INTRAVENOUS | Status: AC
  Administered 2023-09-10: 500 mg via INTRAVENOUS
  Filled 2023-09-10: qty 5

## 2023-09-10 MED ORDER — SODIUM CHLORIDE 0.9 % IV SOLN
1.0000 g | Freq: Once | INTRAVENOUS | Status: AC
  Administered 2023-09-10: 1 g via INTRAVENOUS
  Filled 2023-09-10: qty 10

## 2023-09-10 MED ORDER — OXYCODONE HCL 5 MG PO TABS
5.0000 mg | ORAL_TABLET | Freq: Once | ORAL | Status: AC
  Administered 2023-09-10: 5 mg via ORAL
  Filled 2023-09-10: qty 1

## 2023-09-10 NOTE — ED Provider Notes (Signed)
 [place] EMERGENCY DEPARTMENT AT [facility] Provider Note   CSN: [id number] Arrival date & time: 09/10/23  1559     History {Add pertinent medical, surgical, social history, OB history to HPI:1} Chief Complaint  Patient presents with  . Chest Pain  . Cough  . Shortness of Breath    Janice Ryan is a 68 y.o. female.  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  She initially went to the urgent care but was not wearing her home oxygen and was found to be hypoxic and sent to the emergency department her oxygen levels returned when she was put on oxygen.  Reports she has had some associated headache.  Reports that she has some back pain radiating down past.  3 weeks of dyspnea and cough, coughing up phlegm, white colored phlegm.  Getting worse.Worse since 3 days ago.  Weak all over like have the flu.    No known fever, was drenched in sweat was off O2.  Chills and sweats at home on o2 last 3 days.  Sinus headache Chest pain, comes and goes, feels tight. Out of albuterol .   Chest tightness feels like asthma No noted leg swelling.    3 days ago fell off of the commode, ate some egg rolls and felt sick, while sitting on commode legs went numb. Fell on floor. Did not hit head. Did not have LOC.  Called EMS to have them help her get up.  When they came helped lift her up and back into bed.  In the process of getting information about knee with Dr. Adrain Alar who was willing to do replacement after weight loss.  Having hip pain and seeing him about that.     Was doing ok up until the last few days ago. A lot  of congestion, a lot of phlegm. Terrible pain in hip 2-3 months, struggle with hips, starting therapy with Dr. Adrain Alar.  Ordering MRI   Feels weak. Numbness in right arm down side to feet, referred to neurologist by Dr. Adrain Alar. Has rx that needs to be filled.  Dr. Izzy medication not working, went to [facility] psychiatrist who took her off of medications.   Hit hip when fell off of the commode.  In pain every day.    Past Medical History:  Diagnosis Date  . Acute respiratory failure with hypoxia (HCC)   . Asthma   . Hypertension   . Morbid obesity (HCC)   . Obesity with alveolar hypoventilation and body mass index (BMI) of 40 or greater (HCC)   . OSA (obstructive sleep apnea)      Home Medications Prior to Admission medications   Medication Sig Start Date End Date Taking? Authorizing Provider  diclofenac  (VOLTAREN ) 75 MG EC tablet Take 1 tablet (75 mg total) by mouth 2 (two) times daily. 07/02/23 09/30/23  Senaida Dama, NP  acetaminophen  (TYLENOL ) 500 MG tablet Take 2 tablets (1,000 mg total) by mouth every 8 (eight) hours as needed for mild pain (pain score 1-3) or headache. Patient not taking: Reported on 06/25/2023 02/15/23   Regalado, Belkys A, MD  albuterol  (PROVENTIL ) (2.5 MG/3ML)  0.083% nebulizer solution Take 3 mLs (2.5 mg total) by nebulization every 6 (six) hours as needed for wheezing or shortness of breath. 01/22/23   Senaida Dama, NP  albuterol  (VENTOLIN  HFA) 108 (90 Base) MCG/ACT inhaler Inhale 2 puffs into the lungs every 6 (six) hours as needed for wheezing or shortness of breath. 01/16/23   Senaida Dama, NP  amitriptyline  (ELAVIL ) 25 MG tablet Take 25 mg by mouth at bedtime. Patient not taking: Reported on 06/25/2023    [provider]  amoxicillin -clavulanate (AUGMENTIN ) 875-125 MG tablet Take 1 tablet by mouth 2 (two) times daily. Patient not taking: Reported on 06/25/2023 05/04/23   Senaida Dama, NP  amphetamine-dextroamphetamine (ADDERALL XR) 20 MG 24 hr capsule  Take 20 mg by mouth daily.    [provider]  busPIRone  (BUSPAR ) 10 MG tablet Take 10 mg by mouth 3 (three) times daily.    [provider]  capsicum (ZOSTRIX) 0.075 % topical cream Apply topically 2 (two) times daily. Patient not taking: Reported on 06/25/2023 02/15/23   Regalado, Belkys A, MD  carbamide peroxide (DEBROX) 6.5 % OTIC solution Place 4 drops into both ears daily as needed (to clean ears).    [provider]  cetirizine  (ZYRTEC  ALLERGY) 10 MG tablet Take 1 tablet (10 mg total) by mouth daily. 01/03/23   Senaida Dama, NP  diclofenac  Sodium (VOLTAREN ) 1 % GEL APPLY 2 GRAMS TOPICALLY 4 TIMES A DAY AS NEEDED 07/02/23   Senaida Dama, NP  doxepin  (SINEQUAN ) 50 MG capsule Take 1 capsule (50 mg total) by mouth in the morning. 07/26/23   Mandy Second, PA-C  DULoxetine  (CYMBALTA ) 30 MG capsule Take 1 capsule (30 mg total) by mouth daily. 06/28/23   Senaida Dama, NP  DULoxetine  (CYMBALTA ) 60 MG capsule Take 2 capsules (120 mg total) by mouth daily. 02/15/23   Regalado, Belkys A, MD  fluticasone  (FLONASE ) 50 MCG/ACT nasal spray Place 2 sprays into both nostrils daily. 02/20/22   Charmel Cooter, MD  fluticasone -salmeterol (ADVAIR) 500-50 MCG/ACT AEPB Inhale 1 puff into the lungs in the morning and at bedtime. 08/01/22   Mayers, Cari S, PA-C  gabapentin  (NEURONTIN ) 300 MG capsule Take 1 capsule (300 mg total) by mouth at bedtime. 08/20/23 11/18/23  Senaida Dama, NP  hydrochlorothiazide  (HYDRODIURIL ) 12.5 MG tablet Take 1 tablet (12.5 mg total) by mouth daily. 02/15/23   Regalado, Belkys A, MD  lisinopril  (ZESTRIL ) 20 MG tablet TAKE 1 TABLET BY MOUTH EVERY DAY 07/26/23   Senaida Dama, NP  methylPREDNISolone  (MEDROL  DOSEPAK) 4 MG TBPK tablet Take by mouth as directed. 09/03/23   [provider]  mirtazapine  (REMERON ) 15 MG tablet Take 1 tablet (15 mg total) by mouth at bedtime. 07/26/23   Mandy Second, PA-C  montelukast  (SINGULAIR ) 10 MG tablet Take 1  tablet (10 mg total) by mouth at bedtime. 01/16/23   Senaida Dama, NP  Multiple Vitamin (MULTIVITAMIN WITH MINERALS) TABS tablet Take 1 tablet by mouth daily.    [provider]  NON FORMULARY at bedtime. BIPAP    [provider]  nystatin  (MYCOSTATIN /NYSTOP ) powder Apply topically. 08/08/19   [provider]  Omega-3 Fatty Acids (OMEGA-3 PO) Take 1 capsule by mouth daily.    [provider]  OXYGEN Inhale 5 L into the lungs continuous.    [provider]  pantoprazole  (PROTONIX ) 40 MG tablet Take 1 tablet (40 mg total) by mouth daily. 03/07/23   Elgergawy, Ardia Kraft,  MD  traMADol  (ULTRAM ) 50 MG tablet Take 1 tablet (50 mg total) by mouth every 12 (twelve) hours as needed for severe pain (pain score 7-10). 07/26/23   Mandy Second, PA-C  traZODone  (DESYREL ) 50 MG tablet Oral for 30 Days    [provider]      Allergies    Neurontin  [gabapentin ] and Sulfa antibiotics    Review of Systems   Review of Systems  Physical Exam Updated Vital Signs BP (!) 140/66   Pulse 93   Temp 99.1 F (37.3 C) (Oral)   Resp 18   SpO2 98%  Physical Exam  ED Results / Procedures / Treatments   Labs (all labs ordered are listed, but only abnormal results are displayed) Labs Reviewed  RESP PANEL BY RT-PCR (RSV, FLU A&B, COVID)  RVPGX2  CBC WITH DIFFERENTIAL/PLATELET  COMPREHENSIVE METABOLIC PANEL WITH GFR  BRAIN NATRIURETIC PEPTIDE  TROPONIN I (HIGH SENSITIVITY)    EKG EKG Interpretation Date/Time:  Monday September 10 2023 16:21:28 EDT Ventricular Rate:  89 PR Interval:  138 QRS Duration:  93 QT Interval:  354 QTC Calculation: 431 R Axis:   73  Text Interpretation: Sinus rhythm No significant change since last tracing Confirmed by Scarlette Currier ([id number]) on 09/10/2023 4:46:43 PM  Radiology No results found.  Procedures Procedures  {Document cardiac monitor, telemetry assessment procedure when appropriate:1}  Medications Ordered in  ED Medications - No data to display  ED Course/ Medical Decision Making/ A&P   {   Click here for ABCD2, HEART and other calculatorsREFRESH Note before signing :1}                               ***  {Document critical care time when appropriate:1} {Document review of labs and clinical decision tools ie heart score, Chads2Vasc2 etc:1}  {Document your independent review of radiology images, and any outside records:1} {Document your discussion with family members, caretakers, and with consultants:1} {Document social determinants of health affecting pt's care:1} {Document your decision making why or why not admission, treatments were needed:1} Final Clinical Impression(s) / ED Diagnoses Final diagnoses:  None    Rx / DC Orders ED Discharge Orders     None

## 2023-09-10 NOTE — Telephone Encounter (Signed)
 Patient went to urgent care got checked in and was already in the process of being seen they are going to evaluaute patient and determine her next steps of care. Patients provider notified and CMA.

## 2023-09-10 NOTE — ED Provider Notes (Signed)
 [facility]    CSN: [id number] Arrival date & time: 09/10/23  1357      History   Chief Complaint Chief Complaint  Patient presents with   Chest congestion    Back Pain   Knee Pain   Fatigue    HPI Janice Ryan is a 68 y.o. female.   Patient presents today with cough, shortness of breath, diaphoresis, chest tightness.  She was supposed to see her primary care [place] but mistakenly checked into our clinic.  She reports that she was hospitalized a few weeks ago that we do not have records of this in her chart as her last ER visit was April 2025.  She does have chronic respiratory failure with supplemental oxygen at home but did not have any portable oxygen to bring with her to our clinic and so was noted to have an oxygen saturation of 82% on intake.  This did improve to 96% after being placed on at home oxygen level of 4 to 5 L/min.  She denies any known sick contacts.  She does have a history of asthma but has not been using any medicines to treat this recently.  She does report associated chest tightness and is a something sitting on her chest with pain rated 9 on a 0-10 pain scale.  She reports that she started feeling bad yesterday after eating some egg rolls and felt that she had nausea, vomiting, diarrhea that led to a syncopal episode.  They did call EMS and they held her get to her bed but she was not transported to the emergency room.    Past Medical History:  Diagnosis Date   Acute respiratory failure with hypoxia (HCC)    Asthma    Hypertension    Morbid obesity (HCC)    Obesity with alveolar hypoventilation and body mass index (BMI) of 40 or greater (HCC)    OSA (obstructive sleep apnea)     Patient Active Problem List   Diagnosis Date Noted   Hypokalemia 02/25/2023   Morbid obesity with BMI of 60.0-69.9, adult (HCC) 02/25/2023   OSA (obstructive sleep apnea) 02/07/2023   Obesity hypoventilation syndrome (HCC) 02/07/2023   Asthma, chronic,  unspecified asthma severity, with acute exacerbation 02/07/2023   Suspected novel influenza A virus infection 02/01/2022   AKI (acute kidney injury) (HCC) 01/25/2022   Hypercalcemia 01/24/2022   Pneumonia due to infectious organism 01/23/2022   Acute on chronic respiratory failure with hypoxia and hypercapnia (HCC) 01/21/2022   Fall at home, initial encounter 01/21/2022   Poor social situation 01/21/2022   Primary osteoarthritis of right knee 10/21/2019   Depression 03/31/2019   Major depressive disorder, recurrent episode, 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      Home Medications    Prior to Admission medications   Medication Sig Start Date End Date Taking? Authorizing Provider  diclofenac  (VOLTAREN ) 75 MG EC tablet Take 1 tablet (75 mg total) by mouth 2 (two) times daily. 07/02/23 09/30/23  Senaida Dama, NP  methylPREDNISolone  (MEDROL  DOSEPAK)  4 MG TBPK tablet Take by mouth as directed. 09/03/23  Yes [provider]  nystatin  (MYCOSTATIN /NYSTOP ) powder Apply topically. 08/08/19  Yes [provider]  acetaminophen  (TYLENOL ) 500 MG tablet Take 2 tablets (1,000 mg total) by mouth every 8 (eight) hours as needed for mild pain (pain score 1-3) or headache. Patient not taking: Reported on 06/25/2023 02/15/23   Regalado, Clifford Dam A, MD  albuterol  (PROVENTIL ) (2.5 MG/3ML) 0.083% nebulizer solution Take 3 mLs (2.5 mg total) by nebulization every 6 (six) hours as needed for wheezing or shortness of breath. 01/22/23   Senaida Dama, NP  albuterol  (VENTOLIN  HFA) 108 (90 Base)  MCG/ACT inhaler Inhale 2 puffs into the lungs every 6 (six) hours as needed for wheezing or shortness of breath. 01/16/23   Senaida Dama, NP  amitriptyline  (ELAVIL ) 25 MG tablet Take 25 mg by mouth at bedtime. Patient not taking: Reported on 06/25/2023    [provider]  amoxicillin -clavulanate (AUGMENTIN ) 875-125 MG tablet Take 1 tablet by mouth 2 (two) times daily. Patient not taking: Reported on 06/25/2023 05/04/23   Senaida Dama, NP  amphetamine-dextroamphetamine (ADDERALL XR) 20 MG 24 hr capsule Take 20 mg by mouth daily.    [provider]  busPIRone  (BUSPAR ) 10 MG tablet Take 10 mg by mouth 3 (three) times daily.    [provider]  capsicum (ZOSTRIX) 0.075 % topical cream Apply topically 2 (two) times daily. Patient not taking: Reported on 06/25/2023 02/15/23   Regalado, Belkys A, MD  carbamide peroxide (DEBROX) 6.5 % OTIC solution Place 4 drops into both ears daily as needed (to clean ears).    [provider]  cetirizine  (ZYRTEC  ALLERGY) 10 MG tablet Take 1 tablet (10 mg total) by mouth daily. 01/03/23   Senaida Dama, NP  diclofenac  Sodium (VOLTAREN ) 1 % GEL APPLY 2 GRAMS TOPICALLY 4 TIMES A DAY AS NEEDED 07/02/23   Senaida Dama, NP  doxepin  (SINEQUAN ) 50 MG capsule Take 1 capsule (50 mg total) by mouth in the morning. 07/26/23   Mandy Second, PA-C  DULoxetine  (CYMBALTA ) 30 MG capsule Take 1 capsule (30 mg total) by mouth daily. 06/28/23   Senaida Dama, NP  DULoxetine  (CYMBALTA ) 60 MG capsule Take 2 capsules (120 mg total) by mouth daily. 02/15/23   Regalado, Belkys A, MD  fluticasone  (FLONASE ) 50 MCG/ACT nasal spray Place 2 sprays into both nostrils daily. 02/20/22   Charmel Cooter, MD  fluticasone -salmeterol (ADVAIR) 500-50 MCG/ACT AEPB Inhale 1 puff into the lungs in the morning and at bedtime. 08/01/22   Mayers, Cari S, PA-C  gabapentin  (NEURONTIN ) 300 MG capsule Take 1 capsule (300 mg total) by mouth at bedtime. 08/20/23 11/18/23  Senaida Dama, NP  hydrochlorothiazide  (HYDRODIURIL ) 12.5 MG tablet Take 1 tablet (12.5 mg total) by mouth daily. 02/15/23   Regalado, Belkys A, MD  lisinopril  (ZESTRIL ) 20 MG tablet TAKE 1 TABLET BY MOUTH EVERY DAY 07/26/23   Senaida Dama, NP  mirtazapine  (REMERON ) 15 MG tablet Take 1 tablet (15 mg total) by mouth at bedtime. 07/26/23   Mandy Second, PA-C  montelukast  (SINGULAIR ) 10 MG tablet Take 1 tablet (10 mg total) by mouth at bedtime. 01/16/23   Senaida Dama, NP  Multiple Vitamin (MULTIVITAMIN WITH MINERALS) TABS tablet Take 1 tablet by mouth daily.    [provider]  NON FORMULARY at bedtime. BIPAP    [provider]  Omega-3 Fatty Acids (OMEGA-3 PO) Take 1 capsule by mouth daily.  [provider]  OXYGEN Inhale 5 L into the lungs continuous.    [provider]  pantoprazole  (PROTONIX ) 40 MG tablet Take 1 tablet (40 mg total) by mouth daily. 03/07/23   Elgergawy, Ardia Kraft, MD  traMADol  (ULTRAM ) 50 MG tablet Take 1 tablet (50 mg total) by mouth every 12 (twelve) hours as needed for severe pain (pain score 7-10). 07/26/23   Mandy Second, PA-C  traZODone  (DESYREL ) 50 MG tablet Oral for 30 Days    [provider]    Family History Family History  Problem Relation Age of Onset   Atrial fibrillation Mother    Congestive Heart Failure Father     Social History Social History   Tobacco Use   Smoking status: Never   Smokeless tobacco: Never  Vaping Use   Vaping status: Never Used  Substance Use Topics   Alcohol  use: Never   Drug use: Never     Allergies   Neurontin  [gabapentin ] and Sulfa antibiotics   Review of Systems Review of Systems  Constitutional:  Positive for activity change and fatigue. Negative for appetite change and fever.  Respiratory:  Positive for cough and shortness of breath.   Cardiovascular:  Positive for chest pain. Negative for palpitations and leg swelling.  Gastrointestinal:  Positive for diarrhea, nausea  and vomiting. Negative for abdominal pain.  Neurological:  Positive for syncope, light-headedness and headaches. Negative for dizziness.     Physical Exam Triage Vital Signs ED Triage Vitals  Encounter Vitals Group     BP 09/10/23 1436 (!) 143/87     Systolic BP Percentile --      Diastolic BP Percentile --      Pulse Rate 09/10/23 1436 (!) 105     Resp 09/10/23 1436 (!) 22     Temp 09/10/23 1436 98.8 F (37.1 C)     Temp Source 09/10/23 1436 Oral     SpO2 09/10/23 1436 (!) 82 %     Weight --      Height --      Head Circumference --      Peak Flow --      Pain Score 09/10/23 1443 9     Pain Loc --      Pain Education --      Exclude from Growth Chart --    No data found.  Updated Vital Signs BP (!) 143/87 (BP Location: Left Arm)   Pulse (!) 105   Temp 98.8 F (37.1 C) (Oral)   Resp (!) 22   SpO2 97%   Visual Acuity Right Eye Distance:   Left Eye Distance:   Bilateral Distance:    Right Eye Near:   Left Eye Near:    Bilateral Near:     Physical Exam Vitals reviewed.  Constitutional:      General: She is awake. She is not in acute distress.    Appearance: Normal appearance. She is well-developed. She is ill-appearing and diaphoretic.  HENT:     Head: Normocephalic and atraumatic.  Cardiovascular:     Rate and Rhythm: Normal rate and regular rhythm.     Heart sounds: Normal heart sounds, S1 normal and S2 normal. No murmur heard. Pulmonary:     Effort: Pulmonary effort is normal. Tachypnea present.     Breath sounds: Examination of the right-lower field reveals wheezing. Examination of the left-lower field reveals wheezing. Wheezing present. No rhonchi or rales.  Chest:     Chest wall: No deformity, swelling or  tenderness.  Psychiatric:        Behavior: Behavior is cooperative.      UC Treatments / Results  Labs (all labs ordered are listed, but only abnormal results are displayed) Labs Reviewed - No data to display  EKG   Radiology No results  found.  Procedures Procedures (including critical care time)  Medications Ordered in UC Medications - No data to display  Initial Impression / Assessment and Plan / UC Course  I have reviewed the triage vital signs and the nursing notes.  Pertinent labs & imaging results that were available during my care of the patient were reviewed by me and considered in my medical decision making (see chart for details).     Patient is ill-appearing, diaphoretic, tachypneic, tachycardic.  She did have an increased oxygen requirement and given her clinical presentation I recommended she go to the emergency room for further evaluation and management given her limited resources in urgent care.  EKG was obtained that showed normal sinus rhythm without ischemic changes.  EMS was called to transport as she required oxygen and she was taken to ER for further evaluation and management.  She was stable at time of discharge.  Final Clinical Impressions(s) / UC Diagnoses   Final diagnoses:  Chest pain, unspecified type  Shortness of breath  Syncope and collapse   Discharge Instructions   None    ED Prescriptions   None    PDMP not reviewed this encounter.   Budd Cargo, PA-C 09/10/23 [id number]

## 2023-09-10 NOTE — ED Notes (Signed)
 [place] PIV placed.

## 2023-09-10 NOTE — ED Notes (Signed)
 Patient is being discharged from the Urgent Care and sent to the Emergency Department via EMS . Per Chauncy Coral, PA, patient is in need of higher level of care due to Chest pain, diaphoresis. Patient is aware and verbalizes understanding of plan of care.  Vitals:   09/10/23 1436 09/10/23 1446  BP: (!) 143/87   Pulse: (!) 105   Resp: (!) 22   Temp: 98.8 F (37.1 C)   SpO2: (!) 82% 97%

## 2023-09-10 NOTE — ED Triage Notes (Signed)
 Patient presents from urgent care due an 82% O 2 sat on room air. Sat increased to 96% on 4 L O 2. She originally meant to go to her MD office for right hip pain, but her transportation dropped her off at urgent care. She was recently discharged with an order for home O 2, but it never arrived. She also complains of a productive cough and of chest wall pain that increases with palpation and deep breathing.     EMS vitals: 120/90 BP 93 HR 98% SPO2 on 4 L O 2 nasal canula 103 CBG

## 2023-09-10 NOTE — Progress Notes (Signed)
 Erroneous encounter-disregard

## 2023-09-10 NOTE — ED Notes (Signed)
 Pt diaphoretic, c/o chest pain during EKG. EMS activated

## 2023-09-10 NOTE — ED Triage Notes (Signed)
 Pt presents c/o chest congestion and fatigue. Pt also c/o not being able to breathe. Pt Oxygen sat is 82%.

## 2023-09-10 NOTE — ED Notes (Signed)
 Pt had scheduled appt with PCP today but inadvertently came to Urgent Care lobby and checked in. PCP made aware and stated due to pt's low O2 sats she would need evaluation in the ED. Erin Raspet, PA-C at bedside to evaluate patient at this time.

## 2023-09-11 DIAGNOSIS — J45901 Unspecified asthma with (acute) exacerbation: Secondary | ICD-10-CM | POA: Diagnosis not present

## 2023-09-11 DIAGNOSIS — W1830XA Fall on same level, unspecified, initial encounter: Secondary | ICD-10-CM | POA: Insufficient documentation

## 2023-09-11 DIAGNOSIS — G4733 Obstructive sleep apnea (adult) (pediatric): Secondary | ICD-10-CM

## 2023-09-11 DIAGNOSIS — J189 Pneumonia, unspecified organism: Secondary | ICD-10-CM | POA: Diagnosis present

## 2023-09-11 DIAGNOSIS — F39 Unspecified mood [affective] disorder: Secondary | ICD-10-CM

## 2023-09-11 DIAGNOSIS — I10 Essential (primary) hypertension: Secondary | ICD-10-CM

## 2023-09-11 DIAGNOSIS — M25551 Pain in right hip: Secondary | ICD-10-CM

## 2023-09-11 DIAGNOSIS — M25552 Pain in left hip: Secondary | ICD-10-CM

## 2023-09-11 LAB — BASIC METABOLIC PANEL WITH GFR
Anion gap: 12 (ref 5–15)
BUN: 21 mg/dL (ref 8–23)
CO2: 30 mmol/L (ref 22–32)
Calcium: 9.1 mg/dL (ref 8.9–10.3)
Chloride: 94 mmol/L — ABNORMAL LOW (ref 98–111)
Creatinine, Ser: 1.14 mg/dL — ABNORMAL HIGH (ref 0.44–1.00)
GFR, Estimated: 53 mL/min — ABNORMAL LOW (ref >60)
Glucose, Bld: 209 mg/dL — ABNORMAL HIGH (ref 70–99)
Potassium: 4.4 mmol/L (ref 3.5–5.1)
Sodium: 136 mmol/L (ref 135–145)

## 2023-09-11 LAB — BLOOD GAS, ARTERIAL
Acid-Base Excess: 9.7 mmol/L — ABNORMAL HIGH (ref 0.0–2.0)
Bicarbonate: 37.6 mmol/L — ABNORMAL HIGH (ref 20.0–28.0)
Drawn by: 21338
O2 Content: 5 L/min
O2 Saturation: 97.4 %
Patient temperature: 36.9
pCO2 arterial: 65 mmHg — ABNORMAL HIGH (ref 32–48)
pH, Arterial: 7.37 (ref 7.35–7.45)
pO2, Arterial: 76 mmHg — ABNORMAL LOW (ref 83–108)

## 2023-09-11 LAB — RESPIRATORY PANEL BY PCR

## 2023-09-11 LAB — CBC
HCT: 39.1 % (ref 36.0–46.0)
Hemoglobin: 11.9 g/dL — ABNORMAL LOW (ref 12.0–15.0)
MCH: 29 pg (ref 26.0–34.0)
MCHC: 30.4 g/dL (ref 30.0–36.0)
MCV: 95.4 fL (ref 80.0–100.0)
Platelets: 304 10*3/uL (ref 150–400)
RBC: 4.1 MIL/uL (ref 3.87–5.11)
RDW: 11.9 % (ref 11.5–15.5)
WBC: 15.2 10*3/uL — ABNORMAL HIGH (ref 4.0–10.5)
nRBC: 0 % (ref 0.0–0.2)

## 2023-09-11 LAB — AMMONIA: Ammonia: 23 umol/L (ref 9–35)

## 2023-09-11 LAB — GLUCOSE, CAPILLARY
Glucose-Capillary: 160 mg/dL — ABNORMAL HIGH (ref 70–99)
Glucose-Capillary: 164 mg/dL — ABNORMAL HIGH (ref 70–99)
Glucose-Capillary: 160 mg/dL — ABNORMAL HIGH (ref 70–99)

## 2023-09-11 LAB — I-STAT CG4 LACTIC ACID, ED: Lactic Acid, Venous: 0.5 mmol/L (ref 0.5–1.9)

## 2023-09-11 LAB — MAGNESIUM: Magnesium: 1.8 mg/dL (ref 1.7–2.4)

## 2023-09-11 LAB — PHOSPHORUS: Phosphorus: 3.4 mg/dL (ref 2.5–4.6)

## 2023-09-11 MED ORDER — LORATADINE 10 MG PO TABS
10.0000 mg | ORAL_TABLET | Freq: Every day | ORAL | Status: DC
  Administered 2023-09-11 – 2023-09-13 (×3): 10 mg via ORAL
  Filled 2023-09-11 (×3): qty 1

## 2023-09-11 MED ORDER — DULOXETINE HCL 60 MG PO CPEP
60.0000 mg | ORAL_CAPSULE | Freq: Two times a day (BID) | ORAL | Status: DC
  Administered 2023-09-11 – 2023-09-13 (×5): 60 mg via ORAL
  Filled 2023-09-11 (×5): qty 1

## 2023-09-11 MED ORDER — SODIUM CHLORIDE 0.9 % IV SOLN: 1.0000 g | INTRAVENOUS | Status: DC

## 2023-09-11 MED ORDER — SODIUM CHLORIDE 0.9% FLUSH
3.0000 mL | Freq: Two times a day (BID) | INTRAVENOUS | Status: DC
  Administered 2023-09-11 – 2023-09-13 (×6): 3 mL via INTRAVENOUS

## 2023-09-11 MED ORDER — OXYCODONE HCL 5 MG PO TABS: 5.0000 mg | ORAL_TABLET | ORAL | Status: DC | PRN

## 2023-09-11 MED ORDER — ALBUTEROL SULFATE (2.5 MG/3ML) 0.083% IN NEBU: 2.5000 mg | INHALATION_SOLUTION | RESPIRATORY_TRACT | Status: DC | PRN

## 2023-09-11 MED ORDER — IPRATROPIUM-ALBUTEROL 0.5-2.5 (3) MG/3ML IN SOLN
3.0000 mL | Freq: Four times a day (QID) | RESPIRATORY_TRACT | Status: DC
  Administered 2023-09-11 – 2023-09-13 (×7): 3 mL via RESPIRATORY_TRACT
  Filled 2023-09-11 (×7): qty 3

## 2023-09-11 MED ORDER — DOXEPIN HCL 50 MG PO CAPS
50.0000 mg | ORAL_CAPSULE | Freq: Every day | ORAL | Status: DC
  Administered 2023-09-11 – 2023-09-13 (×3): 50 mg via ORAL
  Filled 2023-09-11 (×3): qty 1

## 2023-09-11 MED ORDER — METHOCARBAMOL 500 MG PO TABS
500.0000 mg | ORAL_TABLET | Freq: Three times a day (TID) | ORAL | Status: DC | PRN
  Administered 2023-09-11 – 2023-09-13 (×4): 500 mg via ORAL
  Filled 2023-09-11 (×4): qty 1

## 2023-09-11 MED ORDER — FLUTICASONE PROPIONATE 50 MCG/ACT NA SUSP: 2.0000 | Freq: Every day | NASAL | Status: DC | PRN

## 2023-09-11 MED ORDER — GABAPENTIN 300 MG PO CAPS
300.0000 mg | ORAL_CAPSULE | Freq: Every day | ORAL | Status: DC
  Administered 2023-09-11 – 2023-09-12 (×3): 300 mg via ORAL
  Filled 2023-09-11 (×3): qty 1

## 2023-09-11 MED ORDER — TRAZODONE HCL 50 MG PO TABS
50.0000 mg | ORAL_TABLET | Freq: Every evening | ORAL | Status: DC | PRN
  Administered 2023-09-11 – 2023-09-12 (×2): 50 mg via ORAL
  Filled 2023-09-11 (×2): qty 1

## 2023-09-11 MED ORDER — INSULIN ASPART 100 UNIT/ML IJ SOLN
0.0000 [IU] | Freq: Three times a day (TID) | INTRAMUSCULAR | Status: DC
  Administered 2023-09-11 (×2): 3 [IU] via SUBCUTANEOUS
  Administered 2023-09-12 – 2023-09-13 (×2): 2 [IU] via SUBCUTANEOUS

## 2023-09-11 MED ORDER — ENOXAPARIN SODIUM 100 MG/ML IJ SOSY
85.0000 mg | PREFILLED_SYRINGE | INTRAMUSCULAR | Status: DC
Start: 2023-09-11 — End: 2023-09-11
  Filled 2023-09-11: qty 1

## 2023-09-11 MED ORDER — MIRTAZAPINE 15 MG PO TABS
15.0000 mg | ORAL_TABLET | Freq: Every day | ORAL | Status: DC
  Administered 2023-09-11 – 2023-09-12 (×3): 15 mg via ORAL
  Filled 2023-09-11 (×3): qty 1

## 2023-09-11 MED ORDER — PANTOPRAZOLE SODIUM 40 MG PO TBEC
40.0000 mg | DELAYED_RELEASE_TABLET | Freq: Every day | ORAL | Status: DC
  Administered 2023-09-11 – 2023-09-13 (×3): 40 mg via ORAL
  Filled 2023-09-11 (×3): qty 1

## 2023-09-11 MED ORDER — FLUTICASONE FUROATE-VILANTEROL 200-25 MCG/ACT IN AEPB
1.0000 | INHALATION_SPRAY | Freq: Every day | RESPIRATORY_TRACT | Status: DC
  Administered 2023-09-11 – 2023-09-13 (×3): 1 via RESPIRATORY_TRACT
  Filled 2023-09-11: qty 28

## 2023-09-11 MED ORDER — BUSPIRONE HCL 5 MG PO TABS
15.0000 mg | ORAL_TABLET | Freq: Three times a day (TID) | ORAL | Status: DC
  Administered 2023-09-11 – 2023-09-13 (×7): 15 mg via ORAL
  Filled 2023-09-11 (×7): qty 1

## 2023-09-11 MED ORDER — OXYCODONE HCL 5 MG PO TABS
5.0000 mg | ORAL_TABLET | Freq: Four times a day (QID) | ORAL | Status: DC | PRN
  Administered 2023-09-11 – 2023-09-12 (×4): 5 mg via ORAL
  Filled 2023-09-11 (×5): qty 1

## 2023-09-11 MED ORDER — TRAMADOL HCL 50 MG PO TABS: 50.0000 mg | ORAL_TABLET | Freq: Four times a day (QID) | ORAL | Status: DC | PRN

## 2023-09-11 MED ORDER — OXYCODONE HCL 5 MG PO TABS: 2.5000 mg | ORAL_TABLET | Freq: Four times a day (QID) | ORAL | Status: DC | PRN

## 2023-09-11 MED ORDER — SODIUM CHLORIDE 0.9 % IV SOLN
2.0000 g | INTRAVENOUS | Status: DC
  Administered 2023-09-11 – 2023-09-12 (×2): 2 g via INTRAVENOUS
  Filled 2023-09-11 (×2): qty 20

## 2023-09-11 MED ORDER — LISINOPRIL 20 MG PO TABS
40.0000 mg | ORAL_TABLET | Freq: Every day | ORAL | Status: DC
  Administered 2023-09-11 – 2023-09-12 (×2): 40 mg via ORAL
  Filled 2023-09-11 (×2): qty 2

## 2023-09-11 MED ORDER — GUAIFENESIN ER 600 MG PO TB12
1200.0000 mg | ORAL_TABLET | Freq: Two times a day (BID) | ORAL | Status: DC
  Administered 2023-09-11 – 2023-09-13 (×5): 1200 mg via ORAL
  Filled 2023-09-11 (×5): qty 2

## 2023-09-11 MED ORDER — ACETAMINOPHEN 500 MG PO TABS
1000.0000 mg | ORAL_TABLET | Freq: Four times a day (QID) | ORAL | Status: DC | PRN
  Administered 2023-09-11 – 2023-09-13 (×5): 1000 mg via ORAL
  Filled 2023-09-11 (×5): qty 2

## 2023-09-11 MED ORDER — KETOROLAC TROMETHAMINE 15 MG/ML IJ SOLN: 15.0000 mg | Freq: Four times a day (QID) | INTRAMUSCULAR | Status: DC | PRN

## 2023-09-11 MED ORDER — PREDNISONE 20 MG PO TABS
40.0000 mg | ORAL_TABLET | Freq: Every day | ORAL | Status: DC
  Administered 2023-09-12 – 2023-09-13 (×2): 40 mg via ORAL
  Filled 2023-09-11 (×2): qty 2

## 2023-09-11 MED ORDER — AZITHROMYCIN 250 MG PO TABS
500.0000 mg | ORAL_TABLET | Freq: Every day | ORAL | Status: AC
  Administered 2023-09-11 – 2023-09-12 (×2): 500 mg via ORAL
  Filled 2023-09-11 (×2): qty 2

## 2023-09-11 MED ORDER — AZITHROMYCIN 250 MG PO TABS: 500.0000 mg | ORAL_TABLET | Freq: Every day | ORAL | Status: DC

## 2023-09-11 MED ORDER — OXYCODONE HCL 5 MG PO TABS: 2.5000 mg | ORAL_TABLET | ORAL | Status: DC | PRN

## 2023-09-11 MED ORDER — OXYCODONE HCL 5 MG PO TABS
5.0000 mg | ORAL_TABLET | Freq: Once | ORAL | Status: AC
  Administered 2023-09-11: 5 mg via ORAL
  Filled 2023-09-11: qty 1

## 2023-09-11 MED ORDER — ENOXAPARIN SODIUM 100 MG/ML IJ SOSY
90.0000 mg | PREFILLED_SYRINGE | INTRAMUSCULAR | Status: DC
  Administered 2023-09-11 – 2023-09-13 (×3): 90 mg via SUBCUTANEOUS
  Filled 2023-09-11 (×3): qty 1

## 2023-09-11 MED ORDER — MONTELUKAST SODIUM 10 MG PO TABS
10.0000 mg | ORAL_TABLET | Freq: Every day | ORAL | Status: DC
  Administered 2023-09-11 – 2023-09-12 (×3): 10 mg via ORAL
  Filled 2023-09-11 (×4): qty 1

## 2023-09-11 NOTE — TOC Initial Note (Addendum)
 Transition of Care [facility]) - Initial/Assessment Note    Patient Details  Name: Janice Ryan MRN: [id number] Date of Birth: 12-08-1955  Transition of Care [facility]) CM/SW Contact:    Bari Leys, RN Phone Number: 09/11/2023, 2:17 PM  Clinical Narrative: met with patient at b bedside to introduce role of TOC/NCM and review for dc planning, patient reports she resides alone with support from her son, has HHA 2x/wk with plan to request more hours. NCM educated patient on contacting her Medicaid Case worker for increase in HHA hours, pt voiced understanding. Home DME: RW, Rollator, Wheelchair,  PT eval completed, recommendation for [facility] PT/OT, pt agreeable, no preference. MOON completed. TOC will  continue to follow.    -2:28pm [facility], rep-Artarvia, accepted for [facility] PT/OT, added to AVS.     -3:48pm Text sent to [facility] w/[employer] to confirm pt on service for home 02.               Expected Discharge Plan: Home w Home Health Services Barriers to Discharge: Continued Medical Work up   Patient Goals and CMS Choice   CMS Medicare.gov Compare Post Acute Care list provided to:: Patient Choice offered to / list presented to : Patient [place] ownership interest in [facility].provided to:: Patient    Expected Discharge Plan and Services       Living arrangements for the past 2 months: Apartment                                      Prior Living Arrangements/Services Living arrangements for the past 2 months: Apartment Lives with:: Self Patient language and need for interpreter reviewed:: Yes Do you feel safe going back to the place where you live?: Yes      Need for Family Participation in Patient Care: Yes (Comment) Care giver support system in place?: Yes (comment) Current home services: DME, Homehealth aide (RW, Rollator) Criminal Activity/Legal Involvement Pertinent to Current Situation/Hospitalization: No - Comment as needed  Activities of Daily  Living   ADL Screening (condition at time of admission) Independently performs ADLs?: Yes (appropriate for developmental age) Is the patient deaf or have difficulty hearing?: No Does the patient have difficulty seeing, even when wearing glasses/contacts?: No Does the patient have difficulty concentrating, remembering, or making decisions?: No  Permission Sought/Granted                  Emotional Assessment Appearance:: Appears stated age Attitude/Demeanor/Rapport: Engaged Affect (typically observed): Accepting Orientation: : Oriented to Self, Oriented to Place, Oriented to  Time, Oriented to Situation Alcohol  / Substance Use: Not Applicable Psych Involvement: No (comment)  Admission diagnosis:  Left hip pain [M25.552] CAP (community acquired pneumonia) [J18.9] Fall, initial encounter [W19.XXXA] Acute nonintractable headache, unspecified headache type [R51.9] Community acquired pneumonia of right upper lobe of lung [J18.9] Exacerbation of asthma, unspecified asthma severity, unspecified whether persistent [J45.901] Patient Active Problem List   Diagnosis Date Noted   CAP (community acquired pneumonia) 09/11/2023   Ground-level fall 09/11/2023   Hypokalemia 02/25/2023   Morbid obesity with BMI of 60.0-69.9, adult (HCC) 02/25/2023   OSA (obstructive sleep apnea) 02/07/2023   Obesity hypoventilation syndrome (HCC) 02/07/2023   Asthma, chronic, unspecified asthma severity, with acute exacerbation 02/07/2023   Suspected novel influenza A virus infection 02/01/2022   AKI (acute kidney injury) (HCC) 01/25/2022   Hypercalcemia 01/24/2022   Pneumonia due to infectious  organism 01/23/2022   Acute on chronic respiratory failure with hypoxia and hypercapnia (HCC) 01/21/2022   Fall at home, initial encounter 01/21/2022   Poor social situation 01/21/2022   Primary osteoarthritis of right knee 10/21/2019   Depression 03/31/2019   Major depressive disorder, recurrent episode, mild  (HCC) 03/31/2019   Chronic respiratory failure with hypoxia (HCC) 12/10/2018   HTN (hypertension) 12/10/2018   Chronic pain of right knee    Moderate persistent asthma 07/28/2018   Contusion of left knee 09/20/2017   Sprain of anterior talofibular ligament of left ankle 09/20/2017   PCP:  Senaida Dama, NP Pharmacy:   [id number] [place] [address], [place] - [address] [place] [address] Phone: [phone] Fax: [phone]  [facility] [address] [place] [address] Phone: [phone] Fax: [phone]  [facility] [address], [place] - [address] [place] [address] Phone: [phone] Fax: [phone]     Social Drivers of Health (SDOH) Social History: SDOH Screenings   Food Insecurity: No Food Insecurity (09/11/2023)  Housing: Low Risk  (09/11/2023)  Transportation Needs: No Transportation Needs (09/11/2023)  Utilities: Not At Risk (09/11/2023)  Depression (PHQ2-9): Low Risk  (07/06/2021)  Social Connections: Socially Integrated (09/11/2023)  Tobacco Use: Low Risk  (09/10/2023)   SDOH Interventions:     Readmission Risk Interventions    03/06/2023   11:10 AM 02/08/2023    4:56 PM  Readmission Risk Prevention Plan  Post Dischage Appt  Complete  Medication Screening  Complete  Transportation Screening Complete Complete  PCP or Specialist Appt within 5-7 Days Complete   Home Care Screening Complete   Medication Review (RN CM) Complete

## 2023-09-11 NOTE — Evaluation (Signed)
 Physical Therapy Evaluation Patient Details Name: Janice Ryan MRN: [id number] DOB: [birth year] Today's Date: 09/11/2023  History of Present Illness  67 yo female presents to therapy following hospital admission on 09/10/2023 due to SOB, cough, R hip pain, fall and loose stools pt found to have CAP.   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  Clinical Impression   Pt admitted with above diagnosis.  Pt currently with functional limitations due to the deficits listed below (see PT Problem List). Pt in bed resting when therapist arrived. Pt easily roused and agreeable to therapy intervention. Pt reports several recent falls and not having R KI in hospital and states poor compliance with R KI wear in home setting due to discomfort. Eval limited due to hx of R knee instability and no KI at this time. Pt required use of hospital bed and CGA  for supine to sit, demonstrated good sitting balance EOB and required min A for sit to supine with c/o back pain during transition. Pt on 5 L/min supplemental O2 at time of intervention and at baseline and maintained O2 saturation >/=90%. Pt will benefit from continued therapy services following hospital d/c with [facility] services.  Pt will benefit from acute skilled PT to increase their independence and safety with mobility to allow discharge.         If plan is discharge home, recommend the following: Two people to help with walking and/or transfers;A lot of help with bathing/dressing/bathroom;Assistance with cooking/housework;Assist for transportation;Help  with stairs or ramp for entrance   Can travel by private vehicle        Equipment Recommendations None recommended by PT  Recommendations for Other Services       Functional Status Assessment Patient has had a recent decline in their functional status and demonstrates the ability to make significant improvements in function in a reasonable and predictable amount of time.     Precautions / Restrictions Precautions Precautions: Fall Required Braces or Orthoses: Knee Immobilizer - Right Restrictions Weight Bearing Restrictions Per Provider Order: No      Mobility  Bed Mobility Overal bed mobility: Needs Assistance Bed Mobility: Supine to Sit, Sit to Supine     Supine to sit: Contact guard, HOB elevated, Used rails Sit to supine: Min assist, HOB elevated, Used rails   General bed mobility comments: with use of bed rails pt is able to perfom supine to sit with increased time, pt required min A for B LE for sit to supine    Transfers                   General transfer comment: NT due to pt hx of R knee instability and recommendation for KI to be donned with R LE WB, pt does not currently have KI at hospital nursing and MD aware    Ambulation/Gait               General Gait Details: NT awating R KI prior to gait assesment  Stairs            Wheelchair Mobility     Tilt Bed    Modified  Rankin (Stroke Patients Only)       Balance Overall balance assessment: History of Falls, Needs assistance Sitting-balance support: Feet supported Sitting balance-Leahy Scale: Good                                       Pertinent Vitals/Pain Pain Assessment Pain Assessment: Faces Faces Pain Scale: Hurts whole lot Pain Location: back when returning to supine from EOB, chronic R knee pain no report of R knee pain at eval Pain Descriptors / Indicators: Aching, Constant, Contraction, Discomfort, Grimacing, Moaning Pain Intervention(s): Monitored  during session, Limited activity within patient's tolerance, Repositioned    Home Living Family/patient expects to be discharged to:: Private residence Living Arrangements: Spouse/significant other Available Help at Discharge: Family;Available PRN/intermittently Type of Home: Apartment Home Access: Level entry       Home Layout: One level   Additional Comments: patient reported she has a lady that comes to help 6 hours a week to help and son helps some.    Prior Function Prior Level of Function : Needs assist             Mobility Comments: working with therapies on mobility at SNF, limited to transfers and short distances ADLs Comments: patient reported walking with rollator at home to get around. wheelchair present in room.     Extremity/Trunk Assessment        Lower Extremity Assessment Lower Extremity Assessment: Generalized weakness    Cervical / Trunk Assessment Cervical / Trunk Assessment: Normal  Communication   Communication Communication: No apparent difficulties    Cognition Arousal: Alert Behavior During Therapy: WFL for tasks assessed/performed   PT - Cognitive impairments: No apparent impairments                         Following commands: Intact       Cueing       General Comments      Exercises     Assessment/Plan    PT Assessment Patient needs continued PT services  PT Problem List Decreased strength;Decreased range of motion;Decreased activity tolerance;Decreased balance;Decreased mobility;Pain       PT Treatment Interventions DME instruction;Gait training;Functional mobility training;Therapeutic activities;Therapeutic exercise;Balance training;Neuromuscular re-education;Patient/family education    PT Goals (Current goals can be found in the Care Plan section)  Acute Rehab PT Goals Patient Stated Goal: to be able to get stronger, loose weight and have R knee surgery PT Goal Formulation: With patient Time For Goal  Achievement: 09/25/23 Potential to Achieve Goals: Good    Frequency Min 3X/week     Co-evaluation PT/OT/SLP Co-Evaluation/Treatment: Yes Reason for Co-Treatment: Complexity of the patient's impairments (multi-system involvement);Necessary to address cognition/behavior during functional activity;For patient/therapist safety PT goals addressed during session: Mobility/safety with mobility;Balance;Proper use of DME OT goals addressed during session: ADL's and self-care;Proper use of [employer] equipment and DME       AM-PAC PT "6 Clicks" Mobility  Outcome Measure Help needed turning from your back to your side while in a flat bed without using bedrails?: None Help needed moving from lying on your back to sitting on the side of a flat bed without using bedrails?: A Little Help needed moving to and from a bed to a chair (including a wheelchair)?: A Little Help needed standing up from a chair using your arms (e.g., wheelchair or bedside chair)?: A Lot Help needed to walk  in hospital room?: Total Help needed climbing 3-5 steps with a railing? : Total 6 Click Score: 14    End of Session Equipment Utilized During Treatment: Oxygen Activity Tolerance: Patient tolerated treatment well Patient left: in bed;with call bell/phone within reach Nurse Communication: Mobility status PT Visit Diagnosis: Unsteadiness on feet (R26.81);Other abnormalities of gait and mobility (R26.89);Repeated falls (R29.6);Muscle weakness (generalized) (M62.81);Difficulty in walking, not elsewhere classified (R26.2);Pain Pain - Right/Left: Right (back) Pain - part of body: Knee;Leg    Time: 1002-1028 PT Time Calculation (min) (ACUTE ONLY): 26 min   Charges:   PT Evaluation $PT Eval Low Complexity: 1 Low   PT General Charges $$ ACUTE PT VISIT: 1 Visit         Cary Clarks, PT Acute Rehab   Annalee Kiang 09/11/2023, 11:26 AM

## 2023-09-11 NOTE — Evaluation (Signed)
 Occupational Therapy Evaluation Patient Details Name: Janice Ryan MRN: [id number] DOB: Mar 22, 1956 Today's Date: 09/11/2023   History of Present Illness   Patient is a 68 year old female who was admitted on 09/10/2023 due to SOB, cough, R hip pain, fall and loose stools pt found to have CAP.   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     Clinical Impressions Patient is a 68 year old female who was admitted for above. Patient reported living at home alone with son support. Patient was limited to EOB at this time with patient not having KI with her upon admission. MD consulted to order another one. Patient was noted to have decreased functional activity tolerance, decreased endurance, decreased standing balance, decreased safety awareness, and decreased knowledge of AD/AE impacting participation in ADLs. Plan is to d/c home with [facility] services when medically stable.      If plan is discharge home, recommend the following:   Assistance with cooking/housework;Direct supervision/assist for medications management;Assist for transportation;Help with stairs or ramp for entrance;Direct supervision/assist for financial management     Functional Status Assessment   Patient has had a recent decline in their functional status and demonstrates the ability to make significant improvements in function in a reasonable and predictable amount of time.     Equipment Recommendations   None recommended by OT      Precautions/Restrictions   Precautions Precautions: Fall Required Braces  or Orthoses: Knee Immobilizer - Right Knee Immobilizer - Right: On when out of bed or walking Restrictions Weight Bearing Restrictions Per Provider Order: No     Mobility Bed Mobility Overal bed mobility: Needs Assistance Bed Mobility: Supine to Sit, Sit to Supine     Supine to sit: Contact guard, HOB elevated, Used rails Sit to supine: Min assist, HOB elevated, Used rails   General bed mobility comments: with use of bed rails pt is able to perfom supine to sit with increased time, pt required min A for B LE for sit to supine         Balance Overall balance assessment: History of Falls, Needs assistance Sitting-balance support: Feet supported Sitting balance-Leahy Scale: Good           ADL either performed or assessed with clinical judgement   ADL Overall ADL's : Needs assistance/impaired Eating/Feeding: Modified independent   Grooming: Set up;Sitting   Upper Body Bathing: Sitting;Set up   Lower Body Bathing: Sitting/lateral leans;Moderate assistance   Upper Body Dressing : Sitting;Set up   Lower Body Dressing: Moderate assistance;Sitting/lateral leans     Toilet Transfer Details (indicate cue type and reason): unable to test as patient did not have Ki available. patient was educated on importance of wearing KI for stability of knee. Toileting- Clothing Manipulation and Hygiene: Bed level;Maximal assistance                Pertinent Vitals/Pain Pain Assessment Pain Assessment: Faces Faces Pain Scale: Hurts whole lot Pain Location: back when returning to supine from EOB, chronic R knee pain no report of R knee pain at eval Pain Descriptors / Indicators: Aching, Constant, Contraction, Discomfort, Grimacing, Moaning Pain Intervention(s):  Limited activity within patient's tolerance, Monitored during session, Repositioned     Extremity/Trunk Assessment Upper Extremity Assessment Upper Extremity Assessment: Overall WFL for tasks assessed   Lower Extremity  Assessment Lower Extremity Assessment: Generalized weakness   Cervical / Trunk Assessment Cervical / Trunk Assessment: Normal   Communication Communication Communication: No apparent difficulties   Cognition Arousal: Alert Behavior During Therapy: WFL for tasks assessed/performed Cognition: Cognition impaired     Awareness: Online awareness impaired, Intellectual awareness impaired     Executive functioning impairment (select all impairments): Problem solving, Reasoning                   Following commands: Intact                  Home Living Family/patient expects to be discharged to:: Private residence Living Arrangements: Spouse/significant other Available Help at Discharge: Family;Available PRN/intermittently Type of Home: Apartment Home Access: Level entry     Home Layout: One level     Bathroom Shower/Tub: Tub/shower unit             Additional Comments: patient reported she has a lady that comes to help 6 hours a week to help and son helps some.      Prior Functioning/Environment Prior Level of Function : Needs assist       Physical Assist : ADLs (physical)     Mobility Comments: working with therapies on mobility at SNF, limited to transfers and short distances ADLs Comments: patient reported walking with rollator at home to get around. wheelchair present in room.    OT Problem List: Decreased knowledge of precautions;Impaired balance (sitting and/or standing);Decreased safety awareness;Decreased activity tolerance;Obesity   OT Treatment/Interventions: Self-care/ADL training;DME and/or AE instruction;Therapeutic activities;Balance training;Energy conservation;Patient/family education      OT Goals(Current goals can be found in the care plan section)   Acute Rehab OT Goals OT Goal Formulation: With patient Time For Goal Achievement: 09/25/23 Potential to Achieve Goals: Fair   OT Frequency:  Min 1X/week    Co-evaluation    Reason for Co-Treatment: Complexity of the patient's impairments (multi-system involvement);Necessary to address cognition/behavior during functional activity;For patient/therapist safety PT goals addressed during session: Mobility/safety with mobility;Balance;Proper use of DME OT goals addressed during session: ADL's and self-care;Proper use of Adaptive equipment and DME      AM-PAC OT "6 Clicks" Daily Activity     Outcome Measure Help from another person eating meals?: None Help from another person taking care of personal grooming?: None Help from another person toileting, which includes using toliet, bedpan, or urinal?: A Lot Help from another person bathing (including washing, rinsing, drying)?: A Lot Help from another person to put on and taking off regular upper body clothing?: A Little Help from another person to put on and taking off regular lower body clothing?: A Lot 6 Click Score: 17   End of Session    Activity Tolerance: Patient tolerated treatment well Patient left: in bed;with call bell/phone within reach;with bed alarm set                   Time: 1001-1024 OT Time Calculation (min): 23 min Charges:  OT General Charges $OT Visit: 1 Visit OT Evaluation $OT Eval Low Complexity: 1 Low  Shawnta Zimbelman OTR/L, MS Acute Rehabilitation Department Office# [phone]   Jame Maze 09/11/2023, 12:57 PM

## 2023-09-11 NOTE — H&P (Signed)
 History and Physical    Janice Ryan [id number] DOB: 01-06-1956 DOA: 09/10/2023  PCP: Senaida Dama, NP   Patient coming from: Home   Chief Complaint:  Chief Complaint  Patient presents with   Chest Pain   Cough   Shortness of Breath    HPI:  Janice Ryan is a 68 y.o. 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  Reports that 3 days ago she thinks she ate some bad egg rolls and was having diarrhea.  Marvell Slider off the toilet 3 days ago and since that time has been having worsening right hip pain.  Around the same time she developed what she describes as cold-like symptoms including cough productive although has not looked at her sputum, runny nose, sore throat, diffuse myalgias.  No sick contacts.  Worsening shortness of breath associated with symptoms.  Occasional chest tightness.  No other recent illness.   Review of Systems:  ROS complete and negative except as marked above   Allergies  Allergen Reactions   Neurontin  [Gabapentin ] Other (See Comments)    Has un-controlled movement of her body, excessive dribble of saliva at night   Sulfa Antibiotics Nausea And Vomiting and Other (See Comments)    Stomach pain    Prior to Admission medications   Medication Sig Start Date End Date Taking? Authorizing Provider  amphetamine-dextroamphetamine (ADDERALL XR) 20 MG 24 hr capsule Take 20 mg by mouth daily.   Yes [provider]  busPIRone  (BUSPAR ) 15 MG tablet Take 15 mg by mouth 3 (three) times daily.   Yes [provider]  cetirizine  (ZYRTEC  ALLERGY) 10 MG tablet Take 1 tablet (10 mg total) by mouth daily. 01/03/23  Yes Rogerio Clay, [name] J, NP  cholecalciferol  (VITAMIN D3) 25 MCG (1000 UNIT) tablet Take 1,000 Units by mouth daily.   Yes [provider]  diclofenac  (VOLTAREN ) 75 MG EC tablet Take 1 tablet (75 mg total) by  mouth 2 (two) times daily. 07/02/23 09/30/23 Yes Rogerio Clay, [name] J, NP  doxepin  (SINEQUAN ) 50 MG capsule Take 1 capsule (50 mg total) by mouth in the morning. 07/26/23  Yes Mandy Second, PA-C  DULoxetine  (CYMBALTA ) 60 MG capsule Take 2 capsules (120 mg total) by mouth daily. Patient taking differently: Take 60 mg by mouth 2 (two) times daily. 02/15/23  Yes Regalado, Belkys A, MD  fluticasone -salmeterol (ADVAIR) 500-50 MCG/ACT AEPB Inhale 1 puff into the lungs in the morning and at bedtime. 08/01/22  Yes Mayers, Cari S, PA-C  gabapentin  (NEURONTIN ) 300 MG capsule Take 1 capsule (300 mg total) by mouth at bedtime. 08/20/23 11/18/23 Yes Rogerio Clay, [name] J, NP  lisinopril  (ZESTRIL ) 20 MG tablet TAKE 1 TABLET BY MOUTH EVERY DAY Patient taking differently: Take 40 mg by mouth daily. 07/26/23  Yes Rogerio Clay, [name] J, NP  meloxicam  (MOBIC ) 15 MG tablet Take 15 mg by mouth in the morning and at bedtime.   Yes [provider]  mirtazapine  (REMERON ) 15 MG tablet Take 1 tablet (15 mg total) by mouth at bedtime. 07/26/23  Yes Upstill, Clovis Dar, PA-C  montelukast  (SINGULAIR ) 10 MG tablet Take 1 tablet (10 mg total) by mouth at bedtime. 01/16/23  Yes Rogerio Clay, [name] J, NP  Multiple Vitamin (MULTIVITAMIN WITH MINERALS) TABS tablet Take 1 tablet by mouth daily.   Yes [provider]  Omega-3 Fatty Acids (OMEGA-3 PO) Take 1 capsule by mouth  daily.   Yes [provider]  pantoprazole  (PROTONIX ) 40 MG tablet Take 1 tablet (40 mg total) by mouth daily. 03/07/23  Yes Elgergawy, Ardia Kraft, MD  traMADol  (ULTRAM ) 50 MG tablet Take 1 tablet (50 mg total) by mouth every 12 (twelve) hours as needed for severe pain (pain score 7-10). Patient taking differently: Take 50 mg by mouth every 6 (six) hours as needed for severe pain (pain score 7-10). 07/26/23  Yes Upstill, Shari, PA-C  traZODone  (DESYREL ) 50 MG tablet Take 50 mg by mouth at bedtime as needed for sleep.   Yes [provider]  acetaminophen  (TYLENOL ) 500  MG tablet Take 2 tablets (1,000 mg total) by mouth every 8 (eight) hours as needed for mild pain (pain score 1-3) or headache. Patient not taking: Reported on 06/25/2023 02/15/23   Regalado, Clifford Dam A, MD  albuterol  (PROVENTIL ) (2.5 MG/3ML) 0.083% nebulizer solution Take 3 mLs (2.5 mg total) by nebulization every 6 (six) hours as needed for wheezing or shortness of breath. Patient not taking: Reported on 09/11/2023 01/22/23   Senaida Dama, NP  albuterol  (VENTOLIN  HFA) 108 (90 Base) MCG/ACT inhaler Inhale 2 puffs into the lungs every 6 (six) hours as needed for wheezing or shortness of breath. 01/16/23   Senaida Dama, NP  amoxicillin -clavulanate (AUGMENTIN ) 875-125 MG tablet Take 1 tablet by mouth 2 (two) times daily. Patient not taking: Reported on 06/25/2023 05/04/23   Senaida Dama, NP  capsicum (ZOSTRIX) 0.075 % topical cream Apply topically 2 (two) times daily. Patient not taking: Reported on 06/25/2023 02/15/23   Regalado, Clifford Dam A, MD  diclofenac  Sodium (VOLTAREN ) 1 % GEL APPLY 2 GRAMS TOPICALLY 4 TIMES A DAY AS NEEDED Patient not taking: Reported on 09/11/2023 07/02/23   Senaida Dama, NP  fluticasone  (FLONASE ) 50 MCG/ACT nasal spray Place 2 sprays into both nostrils daily. 02/20/22   Charmel Cooter, MD  hydrochlorothiazide  (HYDRODIURIL ) 12.5 MG tablet Take 1 tablet (12.5 mg total) by mouth daily. Patient not taking: Reported on 09/11/2023 02/15/23   Regalado, Belkys A, MD  OXYGEN Inhale 5 L into the lungs continuous.    [provider]    Past Medical History:  Diagnosis Date   Acute respiratory failure with hypoxia (HCC)    Asthma    Hypertension    Morbid obesity (HCC)    Obesity with alveolar hypoventilation and body mass index (BMI) of 40 or greater (HCC)    OSA (obstructive sleep apnea)     Past Surgical History:  Procedure Laterality Date   HERNIA REPAIR     JOINT REPLACEMENT Left    knee     reports that she has never smoked. She has never used smokeless  tobacco. She reports that she does not drink alcohol  and does not use drugs.  Family History  Problem Relation Age of Onset   Atrial fibrillation Mother    Congestive Heart Failure Father      Physical Exam: Vitals:   09/10/23 1940 09/10/23 2253 09/11/23 0100 09/11/23 0306  BP: (!) 143/77 (!) 110/93    Pulse: 92 89    Resp: (!) 21 15    Temp:  98.8 F (37.1 C)    TempSrc:  Oral    SpO2: 95% 93%    Weight:   (!) 167.8 kg (!) 183.7 kg  Height:    5\' 8"  (1.727 m)    Gen: Awake, alert, chronically ill-appearing CV: Regular, normal S1, S2, no murmurs  Resp: Normal WOB, diffuse  expiratory wheezing and few rhonchi Abd: Flat, normoactive, nontender MSK: Symmetric, no edema  Skin: No rashes or lesions to exposed skin  Neuro: Alert and interactive  Psych: euthymic, appropriate    Data review:   Labs reviewed, notable for:   WBC 15 BNP 23 High-sensitivity troponin negative   Micro:  Results for orders placed or performed during the hospital encounter of 09/10/23  Resp panel by RT-PCR (RSV, Flu A&B, Covid) Anterior Nasal Swab     Status: None   Collection Time: 09/10/23  4:56 PM   Specimen: Anterior Nasal Swab  Result Value Ref Range Status   SARS Coronavirus 2 by RT PCR NEGATIVE NEGATIVE Final    Comment: (NOTE) SARS-CoV-2 target nucleic acids are NOT DETECTED.  The SARS-CoV-2 RNA is generally detectable in upper respiratory specimens during the acute phase of infection. The lowest concentration of SARS-CoV-2 viral copies this assay can detect is 138 copies/mL. A negative result does not preclude SARS-Cov-2 infection and should not be used as the sole basis for treatment or other patient management decisions. A negative result may occur with  improper specimen collection/handling, submission of specimen other than nasopharyngeal swab, presence of viral mutation(s) within the areas targeted by this assay, and inadequate number of viral copies(<138 copies/mL). A  negative result must be combined with clinical observations, patient history, and epidemiological information. The expected result is Negative.  Fact Sheet for Patients:  BloggerCourse.com  Fact Sheet for Healthcare Providers:  SeriousBroker.it  This test is no t yet approved or cleared by the [place]  FDA and  has been authorized for detection and/or diagnosis of SARS-CoV-2 by FDA under an Emergency Use Authorization (EUA). This EUA will remain  in effect (meaning this test can be used) for the duration of the COVID-19 declaration under Section 564(b)(1) of the Act, 21 U.S.C.section 360bbb-3(b)(1), unless the authorization is terminated  or revoked sooner.       Influenza A by PCR NEGATIVE NEGATIVE Final   Influenza B by PCR NEGATIVE NEGATIVE Final    Comment: (NOTE) The Xpert Xpress SARS-CoV-2/FLU/RSV plus assay is intended as an aid in the diagnosis of influenza from Nasopharyngeal swab specimens and should not be used as a sole basis for treatment. Nasal washings and aspirates are unacceptable for Xpert Xpress SARS-CoV-2/FLU/RSV testing.  Fact Sheet for Patients: BloggerCourse.com  Fact Sheet for Healthcare Providers: SeriousBroker.it  This test is not yet approved or cleared by the [place]  FDA and has been authorized for detection and/or diagnosis of SARS-CoV-2 by FDA under an Emergency Use Authorization (EUA). This EUA will remain in effect (meaning this test can be used) for the duration of the COVID-19 declaration under Section 564(b)(1) of the Act, 21 U.S.C. section 360bbb-3(b)(1), unless the authorization is terminated or revoked.     Resp Syncytial Virus by PCR NEGATIVE NEGATIVE Final    Comment: (NOTE) Fact Sheet for Patients: BloggerCourse.com  Fact Sheet for Healthcare  Providers: SeriousBroker.it  This test is not yet approved or cleared by the [place]  FDA and has been authorized for detection and/or diagnosis of SARS-CoV-2 by FDA under an Emergency Use Authorization (EUA). This EUA will remain in effect (meaning this test can be used) for the duration of the COVID-19 declaration under Section 564(b)(1) of the Act, 21 U.S.C. section 360bbb-3(b)(1), unless the authorization is terminated or revoked.  Performed at [facility], [address], [place], [place] [address]     Imaging reviewed:  CT Head Wo Contrast Result Date:  09/10/2023 CLINICAL DATA:  Head trauma, minor (Age >= 65y) headache EXAM: CT HEAD WITHOUT CONTRAST TECHNIQUE: Contiguous axial images were obtained from the base of the skull through the vertex without intravenous contrast. RADIATION DOSE REDUCTION: This exam was performed according to the departmental dose-optimization program which includes automated exposure control, adjustment of the mA and/or kV according to patient size and/or use of iterative reconstruction technique. COMPARISON:  CT head 02/07/2023 FINDINGS: Brain: Patchy and confluent areas of decreased attenuation are noted throughout the deep and periventricular white matter of the cerebral hemispheres bilaterally, compatible with chronic microvascular ischemic disease. No evidence of large-territorial acute infarction. No parenchymal hemorrhage. No mass lesion. No extra-axial collection. No mass effect or midline shift. No hydrocephalus. Basilar cisterns are patent. Vascular: No hyperdense vessel. Skull: No acute fracture or focal lesion. Sinuses/Orbits: Paranasal sinuses and mastoid air cells are clear. The orbits are unremarkable. Other: None. IMPRESSION: No acute intracranial abnormality. Electronically Signed   By: Morgane  Naveau M.D.   On: 09/10/2023 23:01   CT Lumbar Spine Wo Contrast Result Date: 09/10/2023 CLINICAL DATA:   Low back pain, cauda equina syndrome suspected fall, back pain, left hip pain EXAM: CT LUMBAR SPINE WITHOUT CONTRAST TECHNIQUE: Multidetector CT imaging of the lumbar spine was performed without intravenous contrast administration. Multiplanar CT image reconstructions were also generated. RADIATION DOSE REDUCTION: This exam was performed according to the departmental dose-optimization program which includes automated exposure control, adjustment of the mA and/or kV according to patient size and/or use of iterative reconstruction technique. COMPARISON:  CT lumbar spine 02/07/2023 FINDINGS: Segmentation: 4 lumbar type vertebrae. 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 Alignment: Normal. Vertebrae: Multilevel mild degenerative changes of the spine. No acute fracture or focal pathologic process. Paraspinal and other soft tissues: Lower back midline subcutaneus soft tissue edema. Disc levels: Multilevel intervertebral disc space vacuum phenomenon. Other: Atherosclerotic plaque. Fluid density lesion of the right kidney likely represents a simple renal cyst. IMPRESSION: 1. No acute displaced fracture or traumatic listhesis of the lumbar spine. 2.  Aortic Atherosclerosis (ICD10-I70.0). Electronically Signed   By: Morgane  Naveau M.D.   On: 09/10/2023 22:51   CT PELVIS WO CONTRAST Result Date: 09/10/2023 CLINICAL DATA:  Hip trauma, fracture suspected, no prior imaging EXAM: CT PELVIS WITHOUT CONTRAST TECHNIQUE: Multidetector CT imaging of the pelvis was performed following the standard protocol without intravenous contrast. RADIATION DOSE REDUCTION: This exam was performed according to the departmental dose-optimization program which includes automated exposure control, adjustment of the mA and/or kV according to patient size and/or use of iterative reconstruction technique. COMPARISON:  CT pelvis 02/07/2023 FINDINGS: Urinary Tract:  No abnormality visualized. Bowel:  Unremarkable visualized pelvic bowel loops.  Vascular/Lymphatic: Atherosclerotic plaque. No pathologically enlarged lymph nodes. No significant vascular abnormality seen. Reproductive: Lobulated uterine contour with calcifications suggestive of uterine fibroids. Otherwise no mass or other significant abnormality Other: No intraperitoneal free fluid. No intraperitoneal free gas. No organized fluid collection. Musculoskeletal: No acute displaced fracture or dislocation of either hips. No acute displaced fracture or diastasis of the bones of the pelvis. IMPRESSION: 1. Negative for acute traumatic injury. 2. Uterine fibroids. 3.  Aortic Atherosclerosis (ICD10-I70.0). Electronically Signed   By: Morgane  Naveau M.D.   On: 09/10/2023 22:46   DG Chest Portable 1 View Result Date: 09/10/2023 CLINICAL DATA:  Cough. EXAM: PORTABLE CHEST 1 VIEW COMPARISON:  02/27/2023 FINDINGS: Lordotic exam with rotation. Question of right upper and lower lung zone opacity versus overlapping soft tissue structures. Cardiomegaly is stable. No pneumothorax  or large pleural effusion. Skin fold projects over the right hemithorax. IMPRESSION: 1. Question of right upper and lower lung zone opacity versus overlapping soft tissue structures. Recommend PA and lateral radiograph for further assessment when patient is able. 2. Stable cardiomegaly. Electronically Signed   By: Chadwick Colonel M.D.   On: 09/10/2023 17:54    Reviewed chest x-ray   ED Course:  With history of recent fall underwent imaging including CT head, L-spine, pelvis which were negative for any acute process.  Chest x-ray concerning for right-sided pneumonia.  Treated with ceftriaxone , azithromycin , with component of asthma methylprednisolone  and nebs.  He has oxycodone  for pain.   Assessment/Plan:  68 y.o. 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 Brought in for Obs with CAP  with component of asthma exacerbation, and GLF with deconditioning.   Community-acquired pneumonia Asthma exacerbation  Hx acute onset productive cough, SOB, and associated viral syndrome (myalgias, URI, GI symptoms). On home 5L O2 with normal sat. Exam with significant wheezing. WBC 15. CXR concerning for RU and LL infiltrate.  - Continue ceftriaxone  1 g IV for 24-hour, azithromycin  500 mg daily - S/p methylprednisolone , continue prednisone  40 mg daily due to significant wheezing and concern for component of asthma exacerbation - Check RVP, sputum culture - Continue home Advair equivalent, DuoNebs every 6 hours scheduled, albuterol  every 4 hours as needed, incentive spirometer, flutter valve, encourage out of bed to chair. - Home O2 evaluation prior to discharge  Ground level fall  R hip pain, without fracture  Hx fall 3 weeks ago, and another fall 3 days ago, with worsened R hip pain since this time. No other injuries. CT Head, L spine, Pelvis without acute process. Suspect R hip muscle strain and contusion.  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  ? Chronic pain: Continue home Gabapentin     Body mass index is 61.58 kg/m.    DVT prophylaxis:  Lovenox  Code Status:  Full Code Diet:  Diet Orders (From admission, onward)     Start     Ordered   09/11/23 0107  Diet Heart Room service appropriate? Yes; Fluid consistency: Thin  Diet effective now       Question Answer Comment  Room service  appropriate? Yes   Fluid consistency: Thin      09/11/23 0108           Family Communication:  None   Consults:  None   Admission status:   Observation, Med-Surg  Severity of Illness: The appropriate patient status for this patient is OBSERVATION. Observation status is judged to be reasonable and necessary in order to provide the required intensity of service to ensure the patient's safety. The patient's presenting symptoms, physical exam findings, and initial radiographic and laboratory data in the context of their medical condition is felt to place them at decreased risk for further clinical deterioration. Furthermore, it is anticipated that the patient will  be medically stable for discharge from the hospital within 2 midnights of admission.    Arnulfo Larch, MD [facility]  How to contact the [facility] Attending or Consulting provider 7A - 7P or covering provider during after hours 7P -7A, for this patient.  Check the care team in [facility] and look for a) attending/consulting [facility] provider listed and b) the [facility] team listed Log into [email or web address] and use [place]'s universal password to access. If you do not have the password, please contact the hospital operator. Locate the [facility] provider you are looking for under [facility] and page to a number that you can be directly reached. If you still have difficulty reaching the provider, please page the [facility] (Director on Call) for the Hospitalists listed on amion for assistance.  09/11/2023, 4:28 AM

## 2023-09-11 NOTE — Telephone Encounter (Signed)
 Thank you :)

## 2023-09-11 NOTE — Progress Notes (Signed)
 Orthopedic Tech Progress Note Patient Details:  Janice Ryan February 28, 1956 [id number]  Ortho Devices Type of Ortho Device: Knee Immobilizer Ortho Device/Splint Location: right Ortho Device/Splint Interventions: Ordered, Application, Adjustment   Post Interventions Instructions Provided: Care of device  Leodis Rainwater 09/11/2023, 12:13 PM

## 2023-09-11 NOTE — Care Management Obs Status (Signed)
 MEDICARE OBSERVATION STATUS NOTIFICATION   Patient Details  Name: Janice Ryan MRN: [id number] Date of Birth: [birth year]   Medicare Observation Status Notification Given:  Yes    Bari Leys, RN 09/11/2023, 2:10 PM

## 2023-09-11 NOTE — Progress Notes (Signed)
 I have seen and assessed and I agree with Dr. Mallie Seal assessment and plan. Patient 68 year old female history of OSA/OHS on BiPAP nocturnally and on 5 L O2 in the daytime, HFpEF, morbid obesity, mood disorder presented to the ED with worsening shortness of breath, cough, right hip pain.  Imaging done concerning for pneumonia.  Patient admitted, placed empirically on antibiotics.  Assessment and plan #1 community-acquired pneumonia/asthma exacerbation -Patient admitted with worsening shortness of breath from baseline, cough, upper respiratory symptoms with viral syndrome of myalgias, noted to have some GI symptoms.  Patient also noted to have some diffuse wheezing initially on presentation. -Chest x-ray done concerning for pneumonia. -Respiratory viral panel negative. -Blood cultures pending.   - SARS coronavirus 2 PCR negative, influenza A and B PCR negative, RSV by PCR negative. -Sputum Gram stain and cultures pending. -Increase Rocephin  to 2 g daily. -Continue azithromycin  -Continue Breo Ellipta, scheduled DuoNebs, prednisone . -Placed on Mucinex , PPI, Claritin .  2.  Right hip pain without fracture/ground-level fall -Patient noted to have a fall 3 weeks prior to admission on the floor 3 days prior to admission with worsening right hip pain. -CT head CT L-spine, CT pelvis without any acute abnormalities. -Continue current pain regimen. -PT/OT.  3.  OSA/OHS/chronic respiratory failure -Continue BiPAP nightly. - Continue home O2 of 5 L nasal cannula.  4.  Mood disorder Continue home regimen doxepin , buspirone , duloxetine , mirtazapine , trazodone  as needed.  5.  CKD stage IIIa -Stable.  6.  Morbid obesity -Lifestyle modification -Outpatient follow-up with PCP.  7.  Hypertension -Continue home regimen lisinopril .  No charge.

## 2023-09-12 ENCOUNTER — Observation Stay (HOSPITAL_COMMUNITY)

## 2023-09-12 ENCOUNTER — Encounter

## 2023-09-12 DIAGNOSIS — J189 Pneumonia, unspecified organism: Secondary | ICD-10-CM | POA: Diagnosis not present

## 2023-09-12 DIAGNOSIS — Z1231 Encounter for screening mammogram for malignant neoplasm of breast: Secondary | ICD-10-CM

## 2023-09-12 LAB — GLUCOSE, CAPILLARY
Glucose-Capillary: 98 mg/dL (ref 70–99)
Glucose-Capillary: 111 mg/dL — ABNORMAL HIGH (ref 70–99)
Glucose-Capillary: 148 mg/dL — ABNORMAL HIGH (ref 70–99)
Glucose-Capillary: 184 mg/dL — ABNORMAL HIGH (ref 70–99)

## 2023-09-12 NOTE — Progress Notes (Signed)
 PROGRESS NOTE    Janice Ryan  WUJ:[id number] DOB: [birth year] DOA: 09/10/2023 PCP: Senaida Dama, NP   Brief Narrative:  68 year old morbidly obese female with history of obstructive sleep apnea on BiPAP at home, oxygen dependent 5 L 24/7, history of asthma, heart failure with preserved ejection fraction, was having diarrhea she fell off the toilet complaining of right hip pain.  CT of the hip in the ED does not reveal any acute fractures.  She also started at that time with cold-like symptoms for cough shortness of breath sore throat and diffuse myalgias. Chest x-ray question right upper lobe right lower lobe opacity versus overlapping of soft tissue structures.  Recommending PA lateral view. CT PELVIS and LUMBAR nad BNP 93.1  Assessment & Plan:   Principal Problem:   CAP (community acquired pneumonia) Active Problems:   Asthma, chronic, unspecified asthma severity, with acute exacerbation   Ground-level fall   Left hip pain   Mood disorder (HCC)    Community-acquired pneumonia Asthma exacerbation -patient admitted with dyspnea on exertion, cough shortness of breath, on BiPAP at home and 5 L 24/7.  Chest x-ray was concerning for infiltrates recommending PA lateral views which is pending. Will continue Rocephin  and azithromycin . Taper steroids as able. Saturating 94% on 5 L. She does not feel she is ready to go home today.  Requesting to stay another night. RSV COVID and flu negative. Encourage incentive spirometer nebulizer. PT consult.   Ground level fall  R hip pain, without fracture -patient had a fall 3 weeks prior to admission and a fall yesterday prior to admission.  CT of the hip and lumbar spine showed no acute findings.  PT evaluation pending continue Tylenol  and oxycodone  and muscle relaxant.    Obstructive sleep apnea on BiPAP and 5 L continue  CKD stage IIIa stable not on diuretics   Heart failure with preserved ejection fraction last echo from 2023 with  normal EF.  Not on diuretics prior to admission.    Hypertension continue ACE inhibitor.    History of mood disorder on BuSpar  duloxetine  Remeron  trazodone  and doxepin   Estimated body mass index is 61.58 kg/m as calculated from the following:   Height as of this encounter: 5' 8 (1.727 m).   Weight as of this encounter: 183.7 kg.  DVT prophylaxis: Lovenox  Code Status: Full code Family Communication: None Disposition Plan:  Status is: Observation   Consultants:  None  Procedures: None Antimicrobials: Rocephin  and azithromycin   Subjective: She is resting in bed does not feel she is ready to go home feels dizzy, short of breath dyspneic coughing generalized body aches and pains no diarrhea no nausea vomiting  Objective: Vitals:   09/12/23 0838 09/12/23 0851 09/12/23 0856 09/12/23 1328  BP: 138/66   (!) 119/104  Pulse: (!) 103 99  81  Resp: 20   18  Temp: 98.6 F (37 C)   97.9 F (36.6 C)  TempSrc: Oral   Oral  SpO2: 96% 97% 96% 94%  Weight:      Height:        Intake/Output Summary (Last 24 hours) at 09/12/2023 1330 Last data filed at 09/12/2023 0800 Gross per 24 hour  Intake 1300 ml  Output --  Net 1300 ml   Filed Weights   09/11/23 0100 09/11/23 0306  Weight: (!) 167.8 kg (!) 183.7 kg    Examination:  General exam: Appears calm and comfortable  Respiratory system: Wheezing to auscultation. Respiratory effort normal. Cardiovascular system: S1 & S2  heard, RRR. No JVD, murmurs, rubs, gallops or clicks. No pedal edema. Gastrointestinal system: Abdomen is nondistended, soft and nontender. No organomegaly or masses felt. Normal bowel sounds heard. Central nervous system: Alert and oriented. No focal neurological deficits. Extremities: Edema bilaterally    Data Reviewed: I have personally reviewed following labs and imaging studies  CBC: Recent Labs  Lab 09/10/23 1901 09/11/23 0345  WBC 15.2* 15.2*  NEUTROABS 10.2*  --   HGB 11.3* 11.9*  HCT 37.3 39.1   MCV 95.9 95.4  PLT 323 304   Basic Metabolic Panel: Recent Labs  Lab 09/10/23 1901 09/11/23 0405  NA 138 136  K 4.2 4.4  CL 96* 94*  CO2 30 30  GLUCOSE 88 209*  BUN 20 21  CREATININE 1.07* 1.14*  CALCIUM 9.0 9.1  MG  --  1.8  PHOS  --  3.4   GFR: Estimated Creatinine Clearance: 85.7 mL/min (A) (by C-G formula based on SCr of 1.14 mg/dL (H)). Liver Function Tests: Recent Labs  Lab 09/10/23 1901  AST 11*  ALT 12  ALKPHOS 76  BILITOT 0.7  PROT 6.8  ALBUMIN 3.1*   No results for input(s): LIPASE, AMYLASE in the last 168 hours. Recent Labs  Lab 09/11/23 1345  AMMONIA 23   Coagulation Profile: No results for input(s): INR, PROTIME in the last 168 hours. Cardiac Enzymes: No results for input(s): CKTOTAL, CKMB, CKMBINDEX, TROPONINI in the last 168 hours. BNP (last 3 results) No results for input(s): PROBNP in the last 8760 hours. HbA1C: No results for input(s): HGBA1C in the last 72 hours. CBG: Recent Labs  Lab 09/11/23 1145 09/11/23 1642 09/11/23 1940 09/12/23 0750 09/12/23 1140  GLUCAP 160* 164* 160* 98 111*   Lipid Profile: No results for input(s): CHOL, HDL, LDLCALC, TRIG, CHOLHDL, LDLDIRECT in the last 72 hours. Thyroid Function Tests: No results for input(s): TSH, T4TOTAL, FREET4, T3FREE, THYROIDAB in the last 72 hours. Anemia Panel: No results for input(s): VITAMINB12, FOLATE, FERRITIN, TIBC, IRON, RETICCTPCT in the last 72 hours. Sepsis Labs: Recent Labs  Lab 09/11/23 0045  LATICACIDVEN 0.5    Recent Results (from the past 240 hours)  Resp panel by RT-PCR (RSV, Flu A&B, Covid) Anterior Nasal Swab     Status: None   Collection Time: 09/10/23  4:56 PM   Specimen: Anterior Nasal Swab  Result Value Ref Range Status   SARS Coronavirus 2 by RT PCR NEGATIVE NEGATIVE Final    Comment: (NOTE) SARS-CoV-2 target nucleic acids are NOT DETECTED.  The SARS-CoV-2 RNA is generally detectable in upper  respiratory specimens during the acute phase of infection. The lowest concentration of SARS-CoV-2 viral copies this assay can detect is 138 copies/mL. A negative result does not preclude SARS-Cov-2 infection and should not be used as the sole basis for treatment or other patient management decisions. A negative result may occur with  improper specimen collection/handling, submission of specimen other than nasopharyngeal swab, presence of viral mutation(s) within the areas targeted by this assay, and inadequate number of viral copies(<138 copies/mL). A negative result must be combined with clinical observations, patient history, and epidemiological information. The expected result is Negative.  Fact Sheet for Patients:  BloggerCourse.com  Fact Sheet for Healthcare Providers:  SeriousBroker.it  This test is no t yet approved or cleared by the [place]  FDA and  has been authorized for detection and/or diagnosis of SARS-CoV-2 by FDA under an Emergency Use Authorization (EUA). This EUA will remain  in effect (meaning this test can  be used) for the duration of the COVID-19 declaration under Section 564(b)(1) of the Act, 21 U.S.C.section 360bbb-3(b)(1), unless the authorization is terminated  or revoked sooner.       Influenza A by PCR NEGATIVE NEGATIVE Final   Influenza B by PCR NEGATIVE NEGATIVE Final    Comment: (NOTE) The Xpert Xpress SARS-CoV-2/FLU/RSV plus assay is intended as an aid in the diagnosis of influenza from Nasopharyngeal swab specimens and should not be used as a sole basis for treatment. Nasal washings and aspirates are unacceptable for Xpert Xpress SARS-CoV-2/FLU/RSV testing.  Fact Sheet for Patients: BloggerCourse.com  Fact Sheet for Healthcare Providers: SeriousBroker.it  This test is not yet approved or cleared by the [place]  FDA and has been  authorized for detection and/or diagnosis of SARS-CoV-2 by FDA under an Emergency Use Authorization (EUA). This EUA will remain in effect (meaning this test can be used) for the duration of the COVID-19 declaration under Section 564(b)(1) of the Act, 21 U.S.C. section 360bbb-3(b)(1), unless the authorization is terminated or revoked.     Resp Syncytial Virus by PCR NEGATIVE NEGATIVE Final    Comment: (NOTE) Fact Sheet for Patients: BloggerCourse.com  Fact Sheet for Healthcare Providers: SeriousBroker.it  This test is not yet approved or cleared by the [place]  FDA and has been authorized for detection and/or diagnosis of SARS-CoV-2 by FDA under an Emergency Use Authorization (EUA). This EUA will remain in effect (meaning this test can be used) for the duration of the COVID-19 declaration under Section 564(b)(1) of the Act, 21 U.S.C. section 360bbb-3(b)(1), unless the authorization is terminated or revoked.  Performed at [facility], [address], [place], [place] [address]   Respiratory (~20 pathogens) panel by PCR     Status: None   Collection Time: 09/10/23  4:56 PM   Specimen: Nasopharyngeal Swab; Respiratory  Result Value Ref Range Status   Adenovirus NOT DETECTED NOT DETECTED Final   Coronavirus 229E NOT DETECTED NOT DETECTED Final    Comment: (NOTE) The Coronavirus on the Respiratory Panel, DOES NOT test for the novel  Coronavirus (2019 nCoV)    Coronavirus HKU1 NOT DETECTED NOT DETECTED Final   Coronavirus NL63 NOT DETECTED NOT DETECTED Final   Coronavirus OC43 NOT DETECTED NOT DETECTED Final   Metapneumovirus NOT DETECTED NOT DETECTED Final   Rhinovirus / Enterovirus NOT DETECTED NOT DETECTED Final   Influenza A NOT DETECTED NOT DETECTED Final   Influenza B NOT DETECTED NOT DETECTED Final   Parainfluenza Virus 1 NOT DETECTED NOT DETECTED Final   Parainfluenza Virus 2 NOT DETECTED NOT  DETECTED Final   Parainfluenza Virus 3 NOT DETECTED NOT DETECTED Final   Parainfluenza Virus 4 NOT DETECTED NOT DETECTED Final   Respiratory Syncytial Virus NOT DETECTED NOT DETECTED Final   Bordetella pertussis NOT DETECTED NOT DETECTED Final   Bordetella Parapertussis NOT DETECTED NOT DETECTED Final   Chlamydophila pneumoniae NOT DETECTED NOT DETECTED Final   Mycoplasma pneumoniae NOT DETECTED NOT DETECTED Final    Comment: Performed at [facility], [address], [place], [place] [address]  Blood culture (routine x 2)     Status: None (Preliminary result)   Collection Time: 09/10/23 10:35 PM   Specimen: BLOOD  Result Value Ref Range Status   Specimen Description   Final    BLOOD SITE NOT SPECIFIED Performed at [facility], [address], [place], [place] [address]    Special Requests   Final    BOTTLES DRAWN  AEROBIC AND ANAEROBIC Blood Culture results may not be optimal due to an inadequate volume of blood received in culture bottles Performed at [facility], [address], [place], [place] [address]    Culture   Final    NO GROWTH 1 DAY Performed at [facility], [address], [place], [place] [address]    Report Status PENDING  Incomplete  Blood culture (routine x 2)     Status: None (Preliminary result)   Collection Time: 09/10/23 10:45 PM   Specimen: BLOOD  Result Value Ref Range Status   Specimen Description   Final    BLOOD SITE NOT SPECIFIED Performed at [facility], [address] [place], [place] [address]    Special Requests   Final    BOTTLES DRAWN AEROBIC AND ANAEROBIC Blood Culture results may not be optimal due to an inadequate volume of blood received in culture bottles Performed at [facility], [address] [place], [place] [address]    Culture   Final    NO GROWTH 1 DAY Performed at [facility], [address], [place], [place] [address]    Report  Status PENDING  Incomplete         Radiology Studies: CT Head Wo Contrast Result Date: 09/10/2023 CLINICAL DATA:  Head trauma, minor (Age >= 65y) headache EXAM: CT HEAD WITHOUT CONTRAST TECHNIQUE: Contiguous axial images were obtained from the base of the skull through the vertex without intravenous contrast. RADIATION DOSE REDUCTION: This exam was performed according to the departmental dose-optimization program which includes automated exposure control, adjustment of the mA and/or kV according to patient size and/or use of iterative reconstruction technique. COMPARISON:  CT head 02/07/2023 FINDINGS: Brain: Patchy and confluent areas of decreased attenuation are noted throughout the deep and periventricular white matter of the cerebral hemispheres bilaterally, compatible with chronic microvascular ischemic disease. No evidence of large-territorial acute infarction. No parenchymal hemorrhage. No mass lesion. No extra-axial collection. No mass effect or midline shift. No hydrocephalus. Basilar cisterns are patent. Vascular: No hyperdense vessel. Skull: No acute fracture or focal lesion. Sinuses/Orbits: Paranasal sinuses and mastoid air cells are clear. The orbits are unremarkable. Other: None. IMPRESSION: No acute intracranial abnormality. Electronically Signed   By: Morgane  Naveau M.D.   On: 09/10/2023 23:01   CT Lumbar Spine Wo Contrast Result Date: 09/10/2023 CLINICAL DATA:  Low back pain, cauda equina syndrome suspected fall, back pain, left hip pain EXAM: CT LUMBAR SPINE WITHOUT CONTRAST TECHNIQUE: Multidetector CT imaging of the lumbar spine was performed without intravenous contrast administration. Multiplanar CT image reconstructions were also generated. RADIATION DOSE REDUCTION: This exam was performed according to the departmental dose-optimization program which includes automated exposure control, adjustment of the mA and/or kV according to patient size and/or use of iterative reconstruction  technique. COMPARISON:  CT lumbar spine 02/07/2023 FINDINGS: Segmentation: 4 lumbar type vertebrae. 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 Alignment: Normal. Vertebrae: Multilevel mild degenerative changes of the spine. No acute fracture or focal pathologic process. Paraspinal and other soft tissues: Lower back midline subcutaneus soft tissue edema. Disc levels: Multilevel intervertebral disc space vacuum phenomenon. Other: Atherosclerotic plaque. Fluid density lesion of the right kidney likely represents a simple renal cyst. IMPRESSION: 1. No acute displaced fracture or traumatic listhesis of the lumbar spine. 2.  Aortic Atherosclerosis (ICD10-I70.0). Electronically Signed   By: Morgane  Naveau M.D.   On: 09/10/2023 22:51   CT PELVIS WO CONTRAST Result Date:  09/10/2023 CLINICAL DATA:  Hip trauma, fracture suspected, no prior imaging EXAM: CT PELVIS WITHOUT CONTRAST TECHNIQUE: Multidetector CT imaging of the pelvis was performed following the standard protocol without intravenous contrast. RADIATION DOSE REDUCTION: This exam was performed according to the departmental dose-optimization program which includes automated exposure control, adjustment of the mA and/or kV according to patient size and/or use of iterative reconstruction technique. COMPARISON:  CT pelvis 02/07/2023 FINDINGS: Urinary Tract:  No abnormality visualized. Bowel:  Unremarkable visualized pelvic bowel loops. Vascular/Lymphatic: Atherosclerotic plaque. No pathologically enlarged lymph nodes. No significant vascular abnormality seen. Reproductive: Lobulated uterine contour with calcifications suggestive of uterine fibroids. Otherwise no mass or other significant abnormality Other: No intraperitoneal free fluid. No intraperitoneal free gas. No organized fluid collection. Musculoskeletal: No acute displaced fracture or dislocation of either hips. No acute displaced fracture or diastasis of the bones of the pelvis. IMPRESSION: 1. Negative for  acute traumatic injury. 2. Uterine fibroids. 3.  Aortic Atherosclerosis (ICD10-I70.0). Electronically Signed   By: Morgane  Naveau M.D.   On: 09/10/2023 22:46   DG Chest Portable 1 View Result Date: 09/10/2023 CLINICAL DATA:  Cough. EXAM: PORTABLE CHEST 1 VIEW COMPARISON:  02/27/2023 FINDINGS: Lordotic exam with rotation. Question of right upper and lower lung zone opacity versus overlapping soft tissue structures. Cardiomegaly is stable. No pneumothorax or large pleural effusion. Skin fold projects over the right hemithorax. IMPRESSION: 1. Question of right upper and lower lung zone opacity versus overlapping soft tissue structures. Recommend PA and lateral radiograph for further assessment when patient is able. 2. Stable cardiomegaly. Electronically Signed   By: Chadwick Colonel M.D.   On: 09/10/2023 17:54        Scheduled Meds:  azithromycin   500 mg Oral QHS   busPIRone   15 mg Oral TID   doxepin   50 mg Oral Daily   DULoxetine   60 mg Oral BID   enoxaparin  (LOVENOX ) injection  90 mg Subcutaneous Q24H   fluticasone  furoate-vilanterol  1 puff Inhalation Daily   gabapentin   300 mg Oral QHS   guaiFENesin   1,200 mg Oral BID   insulin aspart  0-15 Units Subcutaneous TID WC   ipratropium-albuterol   3 mL Nebulization Q6H   lisinopril   40 mg Oral Daily   loratadine   10 mg Oral Daily   mirtazapine   15 mg Oral QHS   montelukast   10 mg Oral QHS   pantoprazole   40 mg Oral Daily   predniSONE   40 mg Oral Q breakfast   sodium chloride  flush  3 mL Intravenous Q12H   Continuous Infusions:  cefTRIAXone  (ROCEPHIN )  IV 2 g (09/11/23 2311)     LOS: 0 days    Time spent:50 MIN   Barbee Lew, MD  09/12/2023, 1:30 PM

## 2023-09-12 NOTE — Plan of Care (Signed)
  Problem: Respiratory: Goal: Ability to maintain adequate ventilation will improve Outcome: Progressing   Problem: Clinical Measurements: Goal: Ability to maintain a body temperature in the normal range will improve Outcome: Progressing

## 2023-09-12 NOTE — Progress Notes (Signed)
 This morning, during assessment. Patient was resting in bed without oxygen, and saturation 68% on room air. O2 applied at 5 liters and O2 sat increased 94-97%.

## 2023-09-12 NOTE — Progress Notes (Signed)
 Physical Therapy Treatment Patient Details Name: Janice Ryan MRN: [id number] DOB: Jul 28, 1955 Today's Date: 09/12/2023   History of Present Illness Patient is a 68 year old female who was admitted on 09/10/2023 due to SOB, cough, R hip pain, fall and loose stools pt found to have CAP.   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    PT Comments   Pt admitted with above diagnosis.  Pt currently with functional limitations due to the deficits listed below (see PT Problem List). Pt in bed when PT arrived. Pt agreeable to therapy intervention. Pt on 5 L/min at rest and no reports of SOB. Total A to donn KI.  At rest on RA 94%, seated EOB 92% on RA, s/p gait tasks 87% on RA with c/o SOB. Pt left seated in recliner, all needs in place, on 5 L/min supplemental O2 and >/=94%. Pt will benefit from continued therapy services in home setting. Pt will benefit from acute skilled PT to increase their independence and safety with mobility to allow discharge.      If plan is discharge home, recommend the following: Two people to help with walking and/or transfers;A lot of help with bathing/dressing/bathroom;Assistance with cooking/housework;Assist for transportation;Help with stairs or ramp for entrance   Can travel by private vehicle        Equipment Recommendations  None recommended by PT    Recommendations for Other Services       Precautions / Restrictions Precautions Precautions: Fall Required Braces or Orthoses: Knee Immobilizer - Right Knee Immobilizer - Right: On when out of bed or  walking Restrictions Weight Bearing Restrictions Per Provider Order: No     Mobility  Bed Mobility Overal bed mobility: Needs Assistance Bed Mobility: Supine to Sit     Supine to sit: HOB elevated, Used rails, Min assist     General bed mobility comments: min A for R LE with KI donned    Transfers Overall transfer level: Needs assistance Equipment used: Rolling walker (2 wheels) Transfers: Sit to/from Stand Sit to Stand: Contact guard assist           General transfer comment: min cues and pull to stand, pt requested PT to stabilize RW is used to rollator with locked brakes when standing    Ambulation/Gait Ambulation/Gait assistance: Contact guard assist Gait Distance (Feet): 40 Feet Assistive device: Rolling walker (2 wheels) Gait Pattern/deviations: Step-through pattern, Decreased step length - right, Decreased step length - left, Trunk flexed, Wide base of support Gait velocity: decreased     General Gait Details: B UE support at RW, trunk flexion, R KI donned, lateral sway and wide BOS   Stairs             Wheelchair Mobility     Tilt Bed    Modified Rankin (Stroke Patients Only)       Balance Overall balance assessment: History of Falls, Needs assistance Sitting-balance support: Feet supported Sitting balance-Leahy Scale: Good     Standing balance support: Bilateral upper extremity supported, During functional activity, Reliant on assistive device for balance Standing balance-Leahy Scale: Fair  Communication Communication Communication: No apparent difficulties  Cognition Arousal: Alert Behavior During Therapy: WFL for tasks assessed/performed   PT - Cognitive impairments: No apparent impairments                         Following commands: Intact      Cueing    Exercises      General Comments        Pertinent Vitals/Pain Pain Assessment Pain Assessment: Faces Faces Pain  Scale: Hurts a little bit Pain Location: R knee Pain Descriptors / Indicators: Aching, Constant, Contraction, Discomfort, Grimacing, Moaning Pain Intervention(s): Limited activity within patient's tolerance, Monitored during session, Premedicated before session, Repositioned    Home Living                          Prior Function            PT Goals (current goals can now be found in the care plan section) Acute Rehab PT Goals Patient Stated Goal: to be able to get stronger, loose weight and have R knee surgery PT Goal Formulation: With patient Time For Goal Achievement: 09/25/23 Potential to Achieve Goals: Good Progress towards PT goals: Progressing toward goals    Frequency    Min 3X/week      PT Plan      Co-evaluation              AM-PAC PT 6 Clicks Mobility   Outcome Measure  Help needed turning from your back to your side while in a flat bed without using bedrails?: None Help needed moving from lying on your back to sitting on the side of a flat bed without using bedrails?: A Little Help needed moving to and from a bed to a chair (including a wheelchair)?: A Little Help needed standing up from a chair using your arms (e.g., wheelchair or bedside chair)?: A Little Help needed to walk in hospital room?: A Little Help needed climbing 3-5 steps with a railing? : Total 6 Click Score: 17    End of Session Equipment Utilized During Treatment: Oxygen;Gait belt;Right knee immobilizer Activity Tolerance: Patient tolerated treatment well Patient left: in chair;with chair alarm set;with call bell/phone within reach Nurse Communication: Mobility status;Other (comment) (o2 findings) PT Visit Diagnosis: Unsteadiness on feet (R26.81);Other abnormalities of gait and mobility (R26.89);Repeated falls (R29.6);Muscle weakness (generalized) (M62.81);Difficulty in walking, not elsewhere classified (R26.2);Pain Pain - Right/Left: Right Pain - part of body: Knee;Leg      Time: 1520-1549 PT Time Calculation (min) (ACUTE ONLY): 29 min  Charges:    $Gait Training: 8-22 mins $Therapeutic Activity: 8-22 mins PT General Charges $$ ACUTE PT VISIT: 1 Visit                     Cary Clarks, PT Acute Rehab     Annalee Kiang 09/12/2023, 4:05 PM

## 2023-09-12 NOTE — Progress Notes (Signed)
   09/12/23 0212  BiPAP/CPAP/SIPAP  $ Non-Invasive Ventilator  Non-Invasive Vent Set Up  BiPAP/CPAP/SIPAP Pt Type Adult  BiPAP/CPAP/SIPAP Resmed (at bedside)  Reason BIPAP/CPAP not in use Non-compliant  BiPAP/CPAP /SiPAP Vitals  SpO2 95 %  Bilateral Breath Sounds Diminished

## 2023-09-12 NOTE — Plan of Care (Signed)
  Problem: Elimination: Goal: Will not experience complications related to urinary retention Outcome: Progressing   Problem: Pain Managment: Goal: General experience of comfort will improve and/or be controlled Outcome: Progressing   Problem: Nutritional: Goal: Maintenance of adequate nutrition will improve Outcome: Progressing

## 2023-09-13 ENCOUNTER — Other Ambulatory Visit (HOSPITAL_COMMUNITY): Payer: Self-pay

## 2023-09-13 ENCOUNTER — Observation Stay (HOSPITAL_COMMUNITY)

## 2023-09-13 DIAGNOSIS — J4520 Mild intermittent asthma, uncomplicated: Secondary | ICD-10-CM | POA: Diagnosis not present

## 2023-09-13 DIAGNOSIS — R9431 Abnormal electrocardiogram [ECG] [EKG]: Secondary | ICD-10-CM | POA: Diagnosis not present

## 2023-09-13 DIAGNOSIS — J189 Pneumonia, unspecified organism: Secondary | ICD-10-CM | POA: Diagnosis not present

## 2023-09-13 LAB — COMPREHENSIVE METABOLIC PANEL WITH GFR
ALT: 26 U/L (ref 0–44)
AST: 26 U/L (ref 15–41)
Albumin: 2.8 g/dL — ABNORMAL LOW (ref 3.5–5.0)
Alkaline Phosphatase: 71 U/L (ref 38–126)
Anion gap: 9 (ref 5–15)
BUN: 42 mg/dL — ABNORMAL HIGH (ref 8–23)
CO2: 29 mmol/L (ref 22–32)
Calcium: 9.2 mg/dL (ref 8.9–10.3)
Chloride: 97 mmol/L — ABNORMAL LOW (ref 98–111)
Creatinine, Ser: 1.41 mg/dL — ABNORMAL HIGH (ref 0.44–1.00)
GFR, Estimated: 41 mL/min — ABNORMAL LOW (ref >60)
Glucose, Bld: 143 mg/dL — ABNORMAL HIGH (ref 70–99)
Potassium: 4.8 mmol/L (ref 3.5–5.1)
Sodium: 135 mmol/L (ref 135–145)
Total Bilirubin: 0.5 mg/dL (ref 0.0–1.2)
Total Protein: 6.3 g/dL — ABNORMAL LOW (ref 6.5–8.1)

## 2023-09-13 LAB — CBC
HCT: 36.2 % (ref 36.0–46.0)
Hemoglobin: 10.7 g/dL — ABNORMAL LOW (ref 12.0–15.0)
MCH: 29.2 pg (ref 26.0–34.0)
MCHC: 29.6 g/dL — ABNORMAL LOW (ref 30.0–36.0)
MCV: 98.6 fL (ref 80.0–100.0)
Platelets: 380 10*3/uL (ref 150–400)
RBC: 3.67 MIL/uL — ABNORMAL LOW (ref 3.87–5.11)
RDW: 11.9 % (ref 11.5–15.5)
WBC: 13.1 10*3/uL — ABNORMAL HIGH (ref 4.0–10.5)
nRBC: 0 % (ref 0.0–0.2)

## 2023-09-13 LAB — ECHOCARDIOGRAM COMPLETE
AR max vel: 1.5 cm2
AV Area VTI: 1.63 cm2
AV Area mean vel: 1.49 cm2
AV Mean grad: 10.7 mmHg
AV Peak grad: 20.4 mmHg
Ao pk vel: 2.26 m/s
Area-P 1/2: 2.99 cm2
Height: 68 in
S' Lateral: 3.8 cm
Weight: 6479.76 [oz_av]

## 2023-09-13 LAB — GLUCOSE, CAPILLARY
Glucose-Capillary: 132 mg/dL — ABNORMAL HIGH (ref 70–99)
Glucose-Capillary: 116 mg/dL — ABNORMAL HIGH (ref 70–99)

## 2023-09-13 MED ORDER — ACETAMINOPHEN 500 MG PO TABS
1000.0000 mg | ORAL_TABLET | Freq: Four times a day (QID) | ORAL | 0 refills | Status: AC | PRN
Start: 2023-09-13 — End: ?
  Filled 2023-09-13: qty 30, 4d supply, fill #0

## 2023-09-13 MED ORDER — AMOXICILLIN-POT CLAVULANATE 875-125 MG PO TABS
1.0000 | ORAL_TABLET | Freq: Two times a day (BID) | ORAL | 0 refills | Status: DC
Start: 2023-09-13 — End: 2023-10-09
  Filled 2023-09-13: qty 10, 5d supply, fill #0

## 2023-09-13 MED ORDER — OXYCODONE HCL 5 MG PO TABS
5.0000 mg | ORAL_TABLET | Freq: Four times a day (QID) | ORAL | 0 refills | Status: DC | PRN
  Filled 2023-09-13: qty 30, 8d supply, fill #0

## 2023-09-13 MED ORDER — PERFLUTREN LIPID MICROSPHERE
1.0000 mL | INTRAVENOUS | Status: AC | PRN
  Administered 2023-09-13: 4 mL via INTRAVENOUS

## 2023-09-13 MED ORDER — LISINOPRIL 20 MG PO TABS
20.0000 mg | ORAL_TABLET | Freq: Every day | ORAL | 0 refills | Status: DC
  Filled 2023-09-13: qty 30, 30d supply, fill #0

## 2023-09-13 MED ORDER — TRAMADOL HCL 50 MG PO TABS
50.0000 mg | ORAL_TABLET | Freq: Two times a day (BID) | ORAL | 0 refills | Status: DC | PRN
  Filled 2023-09-13: qty 10, 5d supply, fill #0

## 2023-09-13 MED ORDER — ALBUTEROL SULFATE HFA 108 (90 BASE) MCG/ACT IN AERS
2.0000 | INHALATION_SPRAY | Freq: Four times a day (QID) | RESPIRATORY_TRACT | 2 refills | Status: DC | PRN
  Filled 2023-09-13: qty 18, 30d supply, fill #0
  Filled 2023-12-17: qty 18, 30d supply, fill #1

## 2023-09-13 MED ORDER — PREDNISONE 20 MG PO TABS
40.0000 mg | ORAL_TABLET | Freq: Every day | ORAL | 0 refills | Status: AC
Start: 2023-09-14 — End: 2023-09-18
  Filled 2023-09-13: qty 8, 4d supply, fill #0

## 2023-09-13 NOTE — Progress Notes (Signed)
 Patient has been advised of discharge information, medications have been delivered via [facility]. [facility] was called to pick up patient and transport home. Upon arrival patient refused transportation services due to [facility] not being able to transport patients wheel chair. Patient was advised that the wheel chair can be left and picked up by family/friends. Patient refused. Patient has been advised that she will have to find transportation

## 2023-09-13 NOTE — Plan of Care (Signed)
  Problem: Pain Managment: Goal: General experience of comfort will improve and/or be controlled Outcome: Progressing   Problem: Safety: Goal: Ability to remain free from injury will improve Outcome: Progressing   Problem: Activity: Goal: Ability to tolerate increased activity will improve Outcome: Progressing   Problem: Clinical Measurements: Goal: Ability to maintain a body temperature in the normal range will improve Outcome: Progressing   Problem: Respiratory: Goal: Ability to maintain adequate ventilation will improve Outcome: Progressing

## 2023-09-13 NOTE — Discharge Summary (Signed)
 Physician Discharge Summary  Janice Ryan ZOX:[id number] DOB: 09-Apr-1955 DOA: 09/10/2023  PCP: Senaida Dama, NP  Admit date: 09/10/2023 Discharge date: 09/13/2023  Admitted From: Home Disposition: Home  Recommendations for Outpatient Follow-up:  Follow up with PCP in 1-2 weeks Please obtain BMP/CBC in one week PCP's note that patient needs a CT of the chest in 4 weeks to follow-up on the resolution of infiltrates.  Chest x-ray showed stable right suprahilar density which is primarily visualized on the lateral projection only.  CT scan is recommended. PCP please note patient is on multiple medications that could initiate his serotonin syndrome-BuSpar , duloxetine , Remeron , Ultram , trazodone .  Titrate taper as an outpatient to DC. Home Health:none Equipment/Devices: None Discharge Condition: Stable CODE STATUS: Full Diet recommendation: Cardiac Brief/Interim Summary: 68 year old morbidly obese female with history of obstructive sleep apnea on BiPAP at home, oxygen dependent 5 L 24/7, history of asthma, heart failure with preserved ejection fraction, was having diarrhea she fell off the toilet complaining of right hip pain.  CT of the hip in the ED does not reveal any acute fractures.  She also started at that time with cold-like symptoms for cough shortness of breath sore throat and diffuse myalgias. Chest x-ray question right upper lobe right lower lobe opacity versus overlapping of soft tissue structures.   PA lateral view of the chest shows right suprahilar density and is recommending a CT scan of the chest  CT of the pelvis and lumbar spine showed no acute findings.    Discharge Diagnoses:  Principal Problem:   CAP (community acquired pneumonia) Active Problems:   Asthma, chronic, unspecified asthma severity, with acute exacerbation   Ground-level fall   Left hip pain   Mood disorder (HCC)     Community-acquired pneumonia Asthma exacerbation -patient admitted with dyspnea  on exertion, cough shortness of breath, on BiPAP at home and 5 L 24/7.  Chest x-ray was concerning for infiltrates recommending PA lateral chest x-ray concerning for possible right suprahilar density primary visualized on lateral projection.  CT of the chest recommended in 4 weeks.  She was treated with Rocephin  azithromycin  and steroids.  RSV COVID and flu were negative.  PT was consulted and recommended SNF however patient refused SNF and wanted to go home she called an uber and went home.   Ground level fall  R hip pain, without fracture -patient had a fall 3 weeks prior to admission and a fall yesterday prior to admission.  CT of the hip and lumbar spine showed no acute findings.   She is aware she needs to follow-up with [facility].  I have given her 30 tablets of oxycodone  since she was adamant about Ultram  not helping with her pain.   Obstructive sleep apnea on BiPAP and 5 L continue   CKD stage IIIa stable not on diuretics    Heart failure with preserved ejection fraction last echo from 2023 with normal EF.  Not on diuretics prior to admission.     Hypertension continue ACE inhibitor.     History of mood disorder on BuSpar  duloxetine  Remeron  trazodone  and doxepin  Concern for serotonin syndrome, PCP to titrate and DC as outpatient.   Estimated body mass index is 61.58 kg/m as calculated from the following:   Height as of this encounter: 5' 8 (1.727 m).   Weight as of this encounter: 183.7 kg.  Discharge Instructions  Discharge Instructions     Diet - low sodium heart healthy   Complete by: As directed  Increase activity slowly   Complete by: As directed       Allergies as of 09/13/2023       Reactions   Neurontin  [gabapentin ] Other (See Comments)   Has un-controlled movement of her body, excessive dribble of saliva at night   Sulfa Antibiotics Nausea And Vomiting, Other (See Comments)   Stomach pain        Medication List     STOP taking these medications     capsicum 0.075 % topical cream Commonly known as: ZOSTRIX   diclofenac  75 MG EC tablet Commonly known as: VOLTAREN    diclofenac  Sodium 1 % Gel Commonly known as: VOLTAREN    hydrochlorothiazide  12.5 MG tablet Commonly known as: HYDRODIURIL    traMADol  50 MG tablet Commonly known as: ULTRAM        TAKE these medications    Acetaminophen  Extra Strength 500 MG Tabs Take 2 tablets (1,000 mg total) by mouth every 6 (six) hours as needed for mild pain (pain score 1-3). What changed:  when to take this reasons to take this   amoxicillin -clavulanate 875-125 MG tablet Commonly known as: AUGMENTIN  Take 1 tablet by mouth 2 (two) times daily.   amphetamine-dextroamphetamine 20 MG 24 hr capsule Commonly known as: ADDERALL XR Take 20 mg by mouth daily.   busPIRone  15 MG tablet Commonly known as: BUSPAR  Take 15 mg by mouth 3 (three) times daily.   cetirizine  10 MG tablet Commonly known as: ZyrTEC  Allergy Take 1 tablet (10 mg total) by mouth daily.   cholecalciferol  25 MCG (1000 UNIT) tablet Commonly known as: VITAMIN D3 Take 1,000 Units by mouth daily.   doxepin  50 MG capsule Commonly known as: SINEQUAN  Take 1 capsule (50 mg total) by mouth in the morning.   DULoxetine  60 MG capsule Commonly known as: CYMBALTA  Take 2 capsules (120 mg total) by mouth daily. What changed:  how much to take when to take this   fluticasone  50 MCG/ACT nasal spray Commonly known as: FLONASE  Place 2 sprays into both nostrils daily.   fluticasone -salmeterol 500-50 MCG/ACT Aepb Commonly known as: ADVAIR Inhale 1 puff into the lungs in the morning and at bedtime.   gabapentin  300 MG capsule Commonly known as: NEURONTIN  Take 1 capsule (300 mg total) by mouth at bedtime.   lisinopril  20 MG tablet Commonly known as: ZESTRIL  Take 1 tablet (20 mg total) by mouth daily. What changed: how much to take   meloxicam  15 MG tablet Commonly known as: MOBIC  Take 15 mg by mouth in the morning and  at bedtime.   mirtazapine  15 MG tablet Commonly known as: REMERON  Take 1 tablet (15 mg total) by mouth at bedtime.   montelukast  10 MG tablet Commonly known as: SINGULAIR  Take 1 tablet (10 mg total) by mouth at bedtime.   multivitamin with minerals Tabs tablet Take 1 tablet by mouth daily.   OMEGA-3 PO Take 1 capsule by mouth daily.   oxyCODONE  5 MG immediate release tablet Commonly known as: Oxy IR/ROXICODONE  Take 1 tablet (5 mg total) by mouth every 6 (six) hours as needed for severe pain (pain score 7-10).   OXYGEN Inhale 5 L into the lungs continuous.   pantoprazole  40 MG tablet Commonly known as: PROTONIX  Take 1 tablet (40 mg total) by mouth daily.   predniSONE  20 MG tablet Commonly known as: DELTASONE  Take 2 tablets (40 mg total) by mouth daily with breakfast for 4 days. Start taking on: September 14, 2023   traZODone  50 MG tablet Commonly known as:  DESYREL  Take 50 mg by mouth at bedtime as needed for sleep.   Ventolin  HFA 108 (90 Base) MCG/ACT inhaler Generic drug: albuterol  Inhale 2 puffs into the lungs every 6 (six) hours as needed for wheezing or shortness of breath. What changed: Another medication with the same name was removed. Continue taking this medication, and follow the directions you see here.        Follow-up Information     [facility], [facility]  Follow up.   Why: [facility]upational Therapy Contact information: [address] [place] [address] [phone]         [facility], [facility] Follow up.   Why: On service with [facility] for Home oxygen Contact information: [address] [address] [phone]                Allergies  Allergen Reactions   Neurontin  [Gabapentin ] Other (See Comments)    Has un-controlled movement of her body, excessive dribble of saliva at night   Sulfa Antibiotics Nausea And Vomiting and Other (See  Comments)    Stomach pain    Consultations: None   Procedures/Studies: DG Chest 2 View Result Date: 09/12/2023 CLINICAL DATA:  Hypoxia. EXAM: CHEST - 2 VIEW COMPARISON:  September 10, 2023. FINDINGS: Stable cardiomegaly. Possible oval-shaped density is noted posteriorly in a lung on lateral projection only, potentially in right suprahilar region. The visualized skeletal structures are unremarkable. IMPRESSION: Possible right suprahilar density which is primarily visualized on lateral projection only. CT scan is recommended for further evaluation. Electronically Signed   By: Rosalene Colon M.D.   On: 09/12/2023 18:09   CT Head Wo Contrast Result Date: 09/10/2023 CLINICAL DATA:  Head trauma, minor (Age >= 65y) headache EXAM: CT HEAD WITHOUT CONTRAST TECHNIQUE: Contiguous axial images were obtained from the base of the skull through the vertex without intravenous contrast. RADIATION DOSE REDUCTION: This exam was performed according to the departmental dose-optimization program which includes automated exposure control, adjustment of the mA and/or kV according to patient size and/or use of iterative reconstruction technique. COMPARISON:  CT head 02/07/2023 FINDINGS: Brain: Patchy and confluent areas of decreased attenuation are noted throughout the deep and periventricular white matter of the cerebral hemispheres bilaterally, compatible with chronic microvascular ischemic disease. No evidence of large-territorial acute infarction. No parenchymal hemorrhage. No mass lesion. No extra-axial collection. No mass effect or midline shift. No hydrocephalus. Basilar cisterns are patent. Vascular: No hyperdense vessel. Skull: No acute fracture or focal lesion. Sinuses/Orbits: Paranasal sinuses and mastoid air cells are clear. The orbits are unremarkable. Other: None. IMPRESSION: No acute intracranial abnormality. Electronically Signed   By: Morgane  Naveau M.D.   On: 09/10/2023 23:01   CT Lumbar Spine Wo Contrast Result  Date: 09/10/2023 CLINICAL DATA:  Low back pain, cauda equina syndrome suspected fall, back pain, left hip pain EXAM: CT LUMBAR SPINE WITHOUT CONTRAST TECHNIQUE: Multidetector CT imaging of the lumbar spine was performed without intravenous contrast administration. Multiplanar CT image reconstructions were also generated. RADIATION DOSE REDUCTION: This exam was performed according to the departmental dose-optimization program which includes automated exposure control, adjustment of the mA and/or kV according to patient size and/or use of iterative reconstruction technique. COMPARISON:  CT lumbar spine 02/07/2023 FINDINGS: Segmentation: 4 lumbar type vertebrae. 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 Alignment: Normal. Vertebrae: Multilevel mild degenerative changes of the spine. No acute fracture or focal pathologic process. Paraspinal and other  soft tissues: Lower back midline subcutaneus soft tissue edema. Disc levels: Multilevel intervertebral disc space vacuum phenomenon. Other: Atherosclerotic plaque. Fluid density lesion of the right kidney likely represents a simple renal cyst. IMPRESSION: 1. No acute displaced fracture or traumatic listhesis of the lumbar spine. 2.  Aortic Atherosclerosis (ICD10-I70.0). Electronically Signed   By: Morgane  Naveau M.D.   On: 09/10/2023 22:51   CT PELVIS WO CONTRAST Result Date: 09/10/2023 CLINICAL DATA:  Hip trauma, fracture suspected, no prior imaging EXAM: CT PELVIS WITHOUT CONTRAST TECHNIQUE: Multidetector CT imaging of the pelvis was performed following the standard protocol without intravenous contrast. RADIATION DOSE REDUCTION: This exam was performed according to the departmental dose-optimization program which includes automated exposure control, adjustment of the mA and/or kV according to patient size and/or use of iterative reconstruction technique. COMPARISON:  CT pelvis 02/07/2023 FINDINGS: Urinary Tract:  No abnormality visualized. Bowel:  Unremarkable  visualized pelvic bowel loops. Vascular/Lymphatic: Atherosclerotic plaque. No pathologically enlarged lymph nodes. No significant vascular abnormality seen. Reproductive: Lobulated uterine contour with calcifications suggestive of uterine fibroids. Otherwise no mass or other significant abnormality Other: No intraperitoneal free fluid. No intraperitoneal free gas. No organized fluid collection. Musculoskeletal: No acute displaced fracture or dislocation of either hips. No acute displaced fracture or diastasis of the bones of the pelvis. IMPRESSION: 1. Negative for acute traumatic injury. 2. Uterine fibroids. 3.  Aortic Atherosclerosis (ICD10-I70.0). Electronically Signed   By: Morgane  Naveau M.D.   On: 09/10/2023 22:46   DG Chest Portable 1 View Result Date: 09/10/2023 CLINICAL DATA:  Cough. EXAM: PORTABLE CHEST 1 VIEW COMPARISON:  02/27/2023 FINDINGS: Lordotic exam with rotation. Question of right upper and lower lung zone opacity versus overlapping soft tissue structures. Cardiomegaly is stable. No pneumothorax or large pleural effusion. Skin fold projects over the right hemithorax. IMPRESSION: 1. Question of right upper and lower lung zone opacity versus overlapping soft tissue structures. Recommend PA and lateral radiograph for further assessment when patient is able. 2. Stable cardiomegaly. Electronically Signed   By: Chadwick Colonel M.D.   On: 09/10/2023 17:54   (Echo, Carotid, EGD, Colonoscopy, ERCP)    Subjective:  Had an uneventful night Breathing is back to baseline Discharge Exam: Vitals:   09/13/23 0517 09/13/23 0857  BP: (!) 140/60   Pulse: 83   Resp: 17   Temp: 98.2 F (36.8 C)   SpO2: 96% 93%   Vitals:   09/12/23 2038 09/12/23 2217 09/13/23 0517 09/13/23 0857  BP:  (!) 148/67 (!) 140/60   Pulse:  72 83   Resp:  19 17   Temp:  97.8 F (36.6 C) 98.2 F (36.8 C)   TempSrc:  Oral Oral   SpO2: 94% 98% 96% 93%  Weight:      Height:        General: Pt is alert, awake, not  in acute distress Cardiovascular: RRR, S1/S2 +, no rubs, no gallops Respiratory: CTA bilaterally, no wheezing, no rhonchi Abdominal: Soft, NT, ND, bowel sounds + Extremities: no edema, no cyanosis    The results of significant diagnostics from this hospitalization (including imaging, microbiology, ancillary and laboratory) are listed below for reference.     Microbiology: Recent Results (from the past 240 hours)  Resp panel by RT-PCR (RSV, Flu A&B, Covid) Anterior Nasal Swab     Status: None   Collection Time: 09/10/23  4:56 PM   Specimen: Anterior Nasal Swab  Result Value Ref Range Status   SARS Coronavirus 2 by RT PCR NEGATIVE NEGATIVE  Final    Comment: (NOTE) SARS-CoV-2 target nucleic acids are NOT DETECTED.  The SARS-CoV-2 RNA is generally detectable in upper respiratory specimens during the acute phase of infection. The lowest concentration of SARS-CoV-2 viral copies this assay can detect is 138 copies/mL. A negative result does not preclude SARS-Cov-2 infection and should not be used as the sole basis for treatment or other patient management decisions. A negative result may occur with  improper specimen collection/handling, submission of specimen other than nasopharyngeal swab, presence of viral mutation(s) within the areas targeted by this assay, and inadequate number of viral copies(<138 copies/mL). A negative result must be combined with clinical observations, patient history, and epidemiological information. The expected result is Negative.  Fact Sheet for Patients:  BloggerCourse.com  Fact Sheet for Healthcare Providers:  SeriousBroker.it  This test is no t yet approved or cleared by the [place]  FDA and  has been authorized for detection and/or diagnosis of SARS-CoV-2 by FDA under an Emergency Use Authorization (EUA). This EUA will remain  in effect (meaning this test can be used) for the duration of  the COVID-19 declaration under Section 564(b)(1) of the Act, 21 U.S.C.section 360bbb-3(b)(1), unless the authorization is terminated  or revoked sooner.       Influenza A by PCR NEGATIVE NEGATIVE Final   Influenza B by PCR NEGATIVE NEGATIVE Final    Comment: (NOTE) The Xpert Xpress SARS-CoV-2/FLU/RSV plus assay is intended as an aid in the diagnosis of influenza from Nasopharyngeal swab specimens and should not be used as a sole basis for treatment. Nasal washings and aspirates are unacceptable for Xpert Xpress SARS-CoV-2/FLU/RSV testing.  Fact Sheet for Patients: BloggerCourse.com  Fact Sheet for Healthcare Providers: SeriousBroker.it  This test is not yet approved or cleared by the [place]  FDA and has been authorized for detection and/or diagnosis of SARS-CoV-2 by FDA under an Emergency Use Authorization (EUA). This EUA will remain in effect (meaning this test can be used) for the duration of the COVID-19 declaration under Section 564(b)(1) of the Act, 21 U.S.C. section 360bbb-3(b)(1), unless the authorization is terminated or revoked.     Resp Syncytial Virus by PCR NEGATIVE NEGATIVE Final    Comment: (NOTE) Fact Sheet for Patients: BloggerCourse.com  Fact Sheet for Healthcare Providers: SeriousBroker.it  This test is not yet approved or cleared by the [place]  FDA and has been authorized for detection and/or diagnosis of SARS-CoV-2 by FDA under an Emergency Use Authorization (EUA). This EUA will remain in effect (meaning this test can be used) for the duration of the COVID-19 declaration under Section 564(b)(1) of the Act, 21 U.S.C. section 360bbb-3(b)(1), unless the authorization is terminated or revoked.  Performed at [facility], [address], [place], [place] [address]   Respiratory (~20 pathogens) panel by PCR     Status: None    Collection Time: 09/10/23  4:56 PM   Specimen: Nasopharyngeal Swab; Respiratory  Result Value Ref Range Status   Adenovirus NOT DETECTED NOT DETECTED Final   Coronavirus 229E NOT DETECTED NOT DETECTED Final    Comment: (NOTE) The Coronavirus on the Respiratory Panel, DOES NOT test for the novel  Coronavirus (2019 nCoV)    Coronavirus HKU1 NOT DETECTED NOT DETECTED Final   Coronavirus NL63 NOT DETECTED NOT DETECTED Final   Coronavirus OC43 NOT DETECTED NOT DETECTED Final   Metapneumovirus NOT DETECTED NOT DETECTED Final   Rhinovirus / Enterovirus NOT DETECTED NOT DETECTED Final   Influenza A NOT DETECTED NOT DETECTED Final  Influenza B NOT DETECTED NOT DETECTED Final   Parainfluenza Virus 1 NOT DETECTED NOT DETECTED Final   Parainfluenza Virus 2 NOT DETECTED NOT DETECTED Final   Parainfluenza Virus 3 NOT DETECTED NOT DETECTED Final   Parainfluenza Virus 4 NOT DETECTED NOT DETECTED Final   Respiratory Syncytial Virus NOT DETECTED NOT DETECTED Final   Bordetella pertussis NOT DETECTED NOT DETECTED Final   Bordetella Parapertussis NOT DETECTED NOT DETECTED Final   Chlamydophila pneumoniae NOT DETECTED NOT DETECTED Final   Mycoplasma pneumoniae NOT DETECTED NOT DETECTED Final    Comment: Performed at [facility], [address] [place], [place] [address]  Blood culture (routine x 2)     Status: None (Preliminary result)   Collection Time: 09/10/23 10:35 PM   Specimen: BLOOD  Result Value Ref Range Status   Specimen Description   Final    BLOOD SITE NOT SPECIFIED Performed at [facility], [address], [place], [place] [address]    Special Requests   Final    BOTTLES DRAWN AEROBIC AND ANAEROBIC Blood Culture results may not be optimal due to an inadequate volume of blood received in culture bottles Performed at [facility], [address] [place], [place] [address]    Culture   Final    NO GROWTH 2 DAYS Performed at [facility], [address], [place], [place] [address]    Report Status PENDING  Incomplete  Blood culture (routine x 2)     Status: None (Preliminary result)   Collection Time: 09/10/23 10:45 PM   Specimen: BLOOD  Result Value Ref Range Status   Specimen Description   Final    BLOOD SITE NOT SPECIFIED Performed at [facility], [address] [place], [place] [address]    Special Requests   Final    BOTTLES DRAWN AEROBIC AND ANAEROBIC Blood Culture results may not be optimal due to an inadequate volume of blood received in culture bottles Performed at [facility], [address] [place], [place] [address]    Culture   Final    NO GROWTH 2 DAYS Performed at [facility], [address] [place], [place] [address]    Report Status PENDING  Incomplete     Labs: BNP (last 3 results) Recent Labs    02/28/23 0448 03/01/23 0328 09/10/23 1901  BNP 63.4 43.2 93.1   Basic Metabolic Panel: Recent Labs  Lab 09/10/23 1901 09/11/23 0405 09/13/23 0338  NA 138 136 135  K 4.2 4.4 4.8  CL 96* 94* 97*  CO2 30 30 29   GLUCOSE 88 209* 143*  BUN 20 21 42*  CREATININE 1.07* 1.14* 1.41*  CALCIUM 9.0 9.1 9.2  MG  --  1.8  --   PHOS  --  3.4  --    Liver Function Tests: Recent Labs  Lab 09/10/23 1901 09/13/23 0338  AST 11* 26  ALT 12 26  ALKPHOS 76 71  BILITOT 0.7 0.5  PROT 6.8 6.3*  ALBUMIN 3.1* 2.8*   No results for input(s): LIPASE, AMYLASE in the last 168 hours. Recent Labs  Lab 09/11/23 1345  AMMONIA 23   CBC: Recent Labs  Lab 09/10/23 1901 09/11/23 0345 09/13/23 0338  WBC 15.2* 15.2* 13.1*  NEUTROABS 10.2*  --   --   HGB 11.3* 11.9* 10.7*  HCT 37.3 39.1 36.2  MCV 95.9 95.4 98.6  PLT 323 304 380   Cardiac Enzymes: No  results for input(s): CKTOTAL, CKMB, CKMBINDEX, TROPONINI in the last 168 hours. BNP: Invalid input(s): POCBNP CBG: Recent Labs  Lab 09/12/23 1140 09/12/23 1646 09/12/23 2218  09/13/23 0718 09/13/23 1111  GLUCAP 111* 148* 184* 132* 116*   D-Dimer No results for input(s): DDIMER in the last 72 hours. Hgb A1c No results for input(s): HGBA1C in the last 72 hours. Lipid Profile No results for input(s): CHOL, HDL, LDLCALC, TRIG, CHOLHDL, LDLDIRECT in the last 72 hours. Thyroid function studies No results for input(s): TSH, T4TOTAL, T3FREE, THYROIDAB in the last 72 hours.  Invalid input(s): FREET3 Anemia work up No results for input(s): VITAMINB12, FOLATE, FERRITIN, TIBC, IRON, RETICCTPCT in the last 72 hours. Urinalysis    Component Value Date/Time   COLORURINE YELLOW 01/20/2022 1445   APPEARANCEUR CLEAR 01/20/2022 1445   LABSPEC 1.015 01/20/2022 1445   PHURINE 5.0 01/20/2022 1445   GLUCOSEU NEGATIVE 01/20/2022 1445   HGBUR MODERATE (A) 01/20/2022 1445   BILIRUBINUR NEGATIVE 01/20/2022 1445   KETONESUR NEGATIVE 01/20/2022 1445   PROTEINUR NEGATIVE 01/20/2022 1445   NITRITE NEGATIVE 01/20/2022 1445   LEUKOCYTESUR NEGATIVE 01/20/2022 1445   Sepsis Labs Recent Labs  Lab 09/10/23 1901 09/11/23 0345 09/13/23 0338  WBC 15.2* 15.2* 13.1*   Microbiology Recent Results (from the past 240 hours)  Resp panel by RT-PCR (RSV, Flu A&B, Covid) Anterior Nasal Swab     Status: None   Collection Time: 09/10/23  4:56 PM   Specimen: Anterior Nasal Swab  Result Value Ref Range Status   SARS Coronavirus 2 by RT PCR NEGATIVE NEGATIVE Final    Comment: (NOTE) SARS-CoV-2 target nucleic acids are NOT DETECTED.  The SARS-CoV-2 RNA is generally detectable in upper respiratory specimens during the acute phase of infection. The lowest concentration of SARS-CoV-2 viral copies this assay can detect is 138 copies/mL. A negative result does not preclude SARS-Cov-2 infection and should not be used as the sole basis for treatment or other patient management decisions. A negative result may occur with  improper specimen  collection/handling, submission of specimen other than nasopharyngeal swab, presence of viral mutation(s) within the areas targeted by this assay, and inadequate number of viral copies(<138 copies/mL). A negative result must be combined with clinical observations, patient history, and epidemiological information. The expected result is Negative.  Fact Sheet for Patients:  BloggerCourse.com  Fact Sheet for Healthcare Providers:  SeriousBroker.it  This test is no t yet approved or cleared by the [place]  FDA and  has been authorized for detection and/or diagnosis of SARS-CoV-2 by FDA under an Emergency Use Authorization (EUA). This EUA will remain  in effect (meaning this test can be used) for the duration of the COVID-19 declaration under Section 564(b)(1) of the Act, 21 U.S.C.section 360bbb-3(b)(1), unless the authorization is terminated  or revoked sooner.       Influenza A by PCR NEGATIVE NEGATIVE Final   Influenza B by PCR NEGATIVE NEGATIVE Final    Comment: (NOTE) The Xpert Xpress SARS-CoV-2/FLU/RSV plus assay is intended as an aid in the diagnosis of influenza from Nasopharyngeal swab specimens and should not be used as a sole basis for treatment. Nasal washings and aspirates are unacceptable for Xpert Xpress SARS-CoV-2/FLU/RSV testing.  Fact Sheet for Patients: BloggerCourse.com  Fact Sheet for Healthcare Providers: SeriousBroker.it  This test is not yet approved or cleared by the [place]  FDA and has been authorized for detection and/or diagnosis of SARS-CoV-2 by FDA under an Emergency Use Authorization (EUA). This EUA will remain in effect (  meaning this test can be used) for the duration of the COVID-19 declaration under Section 564(b)(1) of the Act, 21 U.S.C. section 360bbb-3(b)(1), unless the authorization is terminated or revoked.     Resp Syncytial  Virus by PCR NEGATIVE NEGATIVE Final    Comment: (NOTE) Fact Sheet for Patients: BloggerCourse.com  Fact Sheet for Healthcare Providers: SeriousBroker.it  This test is not yet approved or cleared by the [place]  FDA and has been authorized for detection and/or diagnosis of SARS-CoV-2 by FDA under an Emergency Use Authorization (EUA). This EUA will remain in effect (meaning this test can be used) for the duration of the COVID-19 declaration under Section 564(b)(1) of the Act, 21 U.S.C. section 360bbb-3(b)(1), unless the authorization is terminated or revoked.  Performed at [facility], [address], [place], [place] [address]   Respiratory (~20 pathogens) panel by PCR     Status: None   Collection Time: 09/10/23  4:56 PM   Specimen: Nasopharyngeal Swab; Respiratory  Result Value Ref Range Status   Adenovirus NOT DETECTED NOT DETECTED Final   Coronavirus 229E NOT DETECTED NOT DETECTED Final    Comment: (NOTE) The Coronavirus on the Respiratory Panel, DOES NOT test for the novel  Coronavirus (2019 nCoV)    Coronavirus HKU1 NOT DETECTED NOT DETECTED Final   Coronavirus NL63 NOT DETECTED NOT DETECTED Final   Coronavirus OC43 NOT DETECTED NOT DETECTED Final   Metapneumovirus NOT DETECTED NOT DETECTED Final   Rhinovirus / Enterovirus NOT DETECTED NOT DETECTED Final   Influenza A NOT DETECTED NOT DETECTED Final   Influenza B NOT DETECTED NOT DETECTED Final   Parainfluenza Virus 1 NOT DETECTED NOT DETECTED Final   Parainfluenza Virus 2 NOT DETECTED NOT DETECTED Final   Parainfluenza Virus 3 NOT DETECTED NOT DETECTED Final   Parainfluenza Virus 4 NOT DETECTED NOT DETECTED Final   Respiratory Syncytial Virus NOT DETECTED NOT DETECTED Final   Bordetella pertussis NOT DETECTED NOT DETECTED Final   Bordetella Parapertussis NOT DETECTED NOT DETECTED Final   Chlamydophila pneumoniae NOT DETECTED NOT DETECTED Final    Mycoplasma pneumoniae NOT DETECTED NOT DETECTED Final    Comment: Performed at [facility], [address] [place], [place] [address]  Blood culture (routine x 2)     Status: None (Preliminary result)   Collection Time: 09/10/23 10:35 PM   Specimen: BLOOD  Result Value Ref Range Status   Specimen Description   Final    BLOOD SITE NOT SPECIFIED Performed at [facility], [address], [place], [place] [address]    Special Requests   Final    BOTTLES DRAWN AEROBIC AND ANAEROBIC Blood Culture results may not be optimal due to an inadequate volume of blood received in culture bottles Performed at [facility], [address], [place], [place] [address]    Culture   Final    NO GROWTH 2 DAYS Performed at [facility], [address], [place], [place] [address]    Report Status PENDING  Incomplete  Blood culture (routine x 2)     Status: None (Preliminary result)   Collection Time: 09/10/23 10:45 PM   Specimen: BLOOD  Result Value Ref Range Status   Specimen Description   Final    BLOOD SITE NOT SPECIFIED Performed at [facility], [address] [place], [place] [address]    Special Requests   Final    BOTTLES DRAWN AEROBIC AND ANAEROBIC Blood Culture results may  not be optimal due to an inadequate volume of blood received in culture bottles Performed at [facility], [address] [place], [place] [address]    Culture   Final    NO GROWTH 2 DAYS Performed at [facility], [address] [place], [place] [address]    Report Status PENDING  Incomplete     Time coordinating discharge: 39 minutes  SIGNED:   Barbee Lew, MD  [facility] 09/13/2023, 2:11 PM

## 2023-09-13 NOTE — TOC Transition Note (Addendum)
 Transition of Care [facility]) - Discharge Note   Patient Details  Name: Janice Ryan MRN: [id number] Date of Birth: [birth year]  Transition of Care [facility] CM/SW Contact:  Bari Leys, RN Phone Number: 09/13/2023, 9:49 AM   Clinical Narrative:  [facility] consult for SNF placement, teams chat sent to attending, PT recommended [facility] PT/OT, per bedside nurse patient states she plans to dc home with [facility] .   DC to Home order, HH PT/OT arranged with [facility], added to AVS.Confirmed with [facility] pt on service for Home 02..   -1:18pm Teams chat from bedside nurse that patient refused PTAR upon arrival because PTAR unable to transport her wheelchair, States patient will call [facility] for transport.    Final next level of care: Home w Home Health Services Barriers to Discharge: Continued Medical Work up   Patient Goals and CMS Choice Patient states their goals for this hospitalization and ongoing recovery are:: return home CMS Medicare.gov Compare Post Acute Care list provided to:: Patient Choice offered to / list presented to : Patient  ownership interest in [facility]provided to:: Patient    Discharge Placement                       Discharge Plan and Services Additional resources added to the After Visit Summary for                            [facility] Arranged: PT, OT HH Agency: [facility]        Social Drivers of Health (SDOH) Interventions SDOH Screenings   Food Insecurity: No Food Insecurity (09/11/2023)  Housing: Low Risk  (09/11/2023)  Transportation Needs: No Transportation Needs (09/11/2023)  Utilities: Not At Risk (09/11/2023)  Depression (PHQ2-9): Low Risk  (07/06/2021)  Social Connections: Socially Integrated (09/11/2023)  Tobacco Use: Low Risk  (09/10/2023)     Readmission Risk Interventions    03/06/2023   11:10 AM 02/08/2023    4:56 PM  Readmission Risk Prevention Plan  Post Dischage Appt  Complete  Medication  Screening  Complete  Transportation Screening Complete Complete  PCP or Specialist Appt within 5-7 Days Complete   Home Care Screening Complete   Medication Review (RN CM) Complete

## 2023-09-14 ENCOUNTER — Ambulatory Visit: Payer: Self-pay | Admitting: Family

## 2023-09-14 ENCOUNTER — Telehealth: Payer: Self-pay

## 2023-09-14 NOTE — Transitions of Care (Post Inpatient/ED Visit) (Unsigned)
   09/14/2023  Name: Janice Ryan MRN: [id number] DOB: 06/14/1955  Today's TOC FU Call Status: Today's TOC FU Call Status:: Unsuccessful Call (1st Attempt) Unsuccessful Call (1st Attempt) Date: 09/14/23  Attempted to reach the patient regarding the most recent Inpatient/ED visit.  Follow Up Plan: Additional outreach attempts will be made to reach the patient to complete the Transitions of Care (Post Inpatient/ED visit) call.   Signature Darrall Ellison, LPN [facility] Nurse Health Advisor Direct Dial [phone]

## 2023-09-16 LAB — CULTURE, BLOOD (ROUTINE X 2)
Culture: NO GROWTH
Culture: NO GROWTH

## 2023-09-17 ENCOUNTER — Telehealth: Payer: Self-pay | Admitting: Family

## 2023-09-17 ENCOUNTER — Encounter: Payer: Self-pay | Admitting: *Deleted

## 2023-09-17 NOTE — Telephone Encounter (Signed)
 I have attempted to contact this patient by phone with the following results: no answer.

## 2023-09-17 NOTE — Transitions of Care (Post Inpatient/ED Visit) (Unsigned)
   09/17/2023  Name: Janice Ryan MRN: [id number] DOB: 23-Nov-1955  Today's TOC FU Call Status: Today's TOC FU Call Status:: Unsuccessful Call (2nd Attempt) Unsuccessful Call (1st Attempt) Date: 09/14/23 Unsuccessful Call (2nd Attempt) Date: 09/17/23  Attempted to reach the patient regarding the most recent Inpatient/ED visit.  Follow Up Plan: Additional outreach attempts will be made to reach the patient to complete the Transitions of Care (Post Inpatient/ED visit) call.   Signature Darrall Ellison, LPN [facility] Nurse Health Advisor Direct Dial [phone]

## 2023-09-18 NOTE — Transitions of Care (Post Inpatient/ED Visit) (Signed)
   09/18/2023  Name: Hailee Hollick MRN: [id number] DOB: Sep 19, 1955  Today's TOC FU Call Status: Today's TOC FU Call Status:: Unsuccessful Call (3rd Attempt) Unsuccessful Call (1st Attempt) Date: 09/14/23 Unsuccessful Call (2nd Attempt) Date: 09/17/23 Unsuccessful Call (3rd Attempt) Date: 09/18/23  Attempted to reach the patient regarding the most recent Inpatient/ED visit.  Follow Up Plan: No further outreach attempts will be made at this time. We have been unable to contact the patient.  Signature Darrall Ellison, LPN [facility] Nurse Health Advisor Direct Dial [phone]

## 2023-09-20 ENCOUNTER — Ambulatory Visit: Payer: Self-pay

## 2023-09-20 NOTE — Telephone Encounter (Signed)
 FYI Only or Action Required?: Action required by provider: medication refill request.  Patient was last seen in primary care on 06/25/2023 by Senaida Dama, NP. Called Nurse Triage reporting Hip Pain. Symptoms began Ongoing. Interventions attempted: Prescription medications: Tramadol  andTylenol . Symptoms are: gradually worsening.  Triage Disposition: Call PCP Within 24 Hours  Patient/caregiver understands and will follow disposition?: No, wishes to speak with PCP  **Patient wants Tramadol  refilled**              Copied from CRM [id number]. Topic: Clinical - Red Word Triage >> Sep 20, 2023  4:28 PM Ivette P wrote: Red Word that prompted transfer to Nurse Triage:  Extreme pain in the hip. [facility] on monday. really need the tramadol . for pain.    diclofenac  Sodium (VOLTAREN ) 1 % topical gel 2 g   traZODone  (DESYREL ) 50 MG tablet  traMADol  (ULTRAM ) tablet 50 mg Reason for Disposition  [1] Caller requests to speak ONLY to PCP AND [2] NON-URGENT question  Answer Assessment - Initial Assessment Questions 1. REASON FOR CALL or QUESTION: What is your reason for calling today? or How can I best help you? or What question do you have that I can help answer?   Patient wants Tramadol  prescription refilled. She states she needs a knee replacement but must loose weight first. She has an Ortho appt. On Monday 6/23. She only has Tylenol , but states that is not helping the pain. She has an appt. With PCP this upcomming Wednesday but wants the Tramadol  refilled now.  Protocols used: PCP Call - No Triage-A-AH

## 2023-09-21 ENCOUNTER — Ambulatory Visit: Payer: Self-pay | Admitting: Family

## 2023-09-21 ENCOUNTER — Other Ambulatory Visit: Payer: Self-pay | Admitting: Family

## 2023-09-21 NOTE — Telephone Encounter (Signed)
 I have attempted to contact this patient by phone with the following results: no answer.

## 2023-09-21 NOTE — Telephone Encounter (Signed)
 FYI Only or Action Required?: Action required by provider: medication refill request.  Patient was last seen in primary care on 06/25/2023 by Senaida Dama, NP. Called Nurse Triage reporting Pain.  Triage Disposition: Call PCP Now  Patient/caregiver understands and will follow disposition?: Yes              Copied from CRM [id number]. Topic: Clinical - Red Word Triage >> Sep 21, 2023  1:23 PM Leory Rands wrote: Red Word that prompted transfer to Nurse Triage: Patient is on the phone crying requesting a refill script on Rx #: [id number]  traMADol  (ULTRAM ) 50 MG tablet [[id number]]  DISCONTINUED Prescribed in last in the hospital on 09/11/23. Hospital follow up with [name] 09/26/23. Appointment with Pain Management is not scheduled. Patient states that she is able to function without oxygen at 95% for not a long period of time. [name] does not want to give her the medication because she is on oxygen. Patient reporting that she is unable to work with pain. Patient stating she has got to have some relieve (8-9/10)  Patient states that the hospital prescribes  amitriptyline  (ELAVIL ) tablet 25 mg   [[id number]] with pain and sleep. Can this be prescribed. When she is on this medication pain 2/10 Reason for Disposition  [1] Prescription refill request for ESSENTIAL medicine (i.e., likelihood of harm to patient if not taken) AND [2] triager unable to refill per department policy  Answer Assessment - Initial Assessment Questions Patient call back number is [phone]    Patient states she has hip, knee, and back pain; pt states she has fibromyalgia; 9/10 pain level goes up and down Pt states she was in hospital last week for pneumonia; pt has three more pills of her antibiotic Pt complains of fatigue and excessive depression; pt was on depression medications and stopped taking them Pt states she needs a refill of tramadol , elavil , and trazodone  sent to [facility] Pt has a hospital  follow up appointment with PCP on Wednesday, 6/25 Pt states if her symptoms get worse she is going to go to hospital  Protocols used: Medication Refill and Renewal Call-A-AH

## 2023-09-24 NOTE — Telephone Encounter (Signed)
 Trazodone  appears as historical medication.

## 2023-09-25 ENCOUNTER — Ambulatory Visit: Payer: Self-pay

## 2023-09-25 NOTE — Telephone Encounter (Signed)
 FYI Only or Action Required?: FYI only for provider.  Patient was last seen in primary care on 06/25/2023 by Janice Greig PARAS, NP. Called Nurse Triage reporting Advice Only. Symptoms began today. Interventions attempted: Other: pt calling 911 d/t fall in bathtub. Symptoms are: worse d/t pt not having pain medication per pt. .  Triage Disposition: Call PCP Now (overriding Information or Advice Only Call)  Patient/caregiver understands and will follow disposition?: Yes  Copied from CRM [id number]. Topic: Clinical - Red Word Triage >> Sep 25, 2023  3:56 PM Janice Ryan wrote: [facility] that prompted transfer to Nurse Triage: fell in bath tub intense pain Reason for Disposition  [1] Follow-up call to recent contact AND [2] information only call, no triage required  Answer Assessment - Initial Assessment Questions 1. REASON FOR CALL or QUESTION: What is your reason for calling today? or How can I best help you? or What question do you have that I can help answer?     Pt calling to report that she has fallen in bathtub and is going to ED. Pt states that she is upset at PCP d/t not refilling Tramadol  and she severely needed the medication d/t having back pain and numbness in her feet.  Protocols used: Information Only Call - No Triage-A-AH

## 2023-09-25 NOTE — Telephone Encounter (Signed)
 Noted

## 2023-09-25 NOTE — Telephone Encounter (Signed)
 Trazodone  appears as historical medication.

## 2023-09-26 ENCOUNTER — Inpatient Hospital Stay (HOSPITAL_COMMUNITY)

## 2023-09-26 ENCOUNTER — Encounter: Admitting: Family

## 2023-09-26 ENCOUNTER — Emergency Department (HOSPITAL_COMMUNITY)

## 2023-09-26 ENCOUNTER — Encounter (HOSPITAL_COMMUNITY): Payer: Self-pay

## 2023-09-26 ENCOUNTER — Inpatient Hospital Stay (HOSPITAL_COMMUNITY)
Admission: EM | Admit: 2023-09-26 | Discharge: 2023-10-09 | DRG: 189 | Disposition: A | Discharge status: Skilled Nursing Facility | Attending: Family Medicine | Admitting: Family Medicine

## 2023-09-26 ENCOUNTER — Other Ambulatory Visit: Payer: Self-pay

## 2023-09-26 ENCOUNTER — Ambulatory Visit: Payer: Self-pay

## 2023-09-26 DIAGNOSIS — W010XXA Fall on same level from slipping, tripping and stumbling without subsequent striking against object, initial encounter: Secondary | ICD-10-CM | POA: Diagnosis present

## 2023-09-26 DIAGNOSIS — R531 Weakness: Secondary | ICD-10-CM | POA: Diagnosis present

## 2023-09-26 DIAGNOSIS — M48061 Spinal stenosis, lumbar region without neurogenic claudication: Secondary | ICD-10-CM | POA: Diagnosis present

## 2023-09-26 DIAGNOSIS — Z9981 Dependence on supplemental oxygen: Secondary | ICD-10-CM | POA: Diagnosis not present

## 2023-09-26 DIAGNOSIS — Z888 Allergy status to other drugs, medicaments and biological substances status: Secondary | ICD-10-CM

## 2023-09-26 DIAGNOSIS — F411 Generalized anxiety disorder: Secondary | ICD-10-CM | POA: Diagnosis present

## 2023-09-26 DIAGNOSIS — F32A Depression, unspecified: Secondary | ICD-10-CM | POA: Diagnosis present

## 2023-09-26 DIAGNOSIS — J301 Allergic rhinitis due to pollen: Secondary | ICD-10-CM | POA: Diagnosis not present

## 2023-09-26 DIAGNOSIS — Z791 Long term (current) use of non-steroidal anti-inflammatories (NSAID): Secondary | ICD-10-CM

## 2023-09-26 DIAGNOSIS — E876 Hypokalemia: Secondary | ICD-10-CM | POA: Diagnosis not present

## 2023-09-26 DIAGNOSIS — Z8249 Family history of ischemic heart disease and other diseases of the circulatory system: Secondary | ICD-10-CM

## 2023-09-26 DIAGNOSIS — W19XXXA Unspecified fall, initial encounter: Principal | ICD-10-CM

## 2023-09-26 DIAGNOSIS — T402X5A Adverse effect of other opioids, initial encounter: Secondary | ICD-10-CM | POA: Diagnosis present

## 2023-09-26 DIAGNOSIS — M25551 Pain in right hip: Secondary | ICD-10-CM | POA: Diagnosis present

## 2023-09-26 DIAGNOSIS — J9601 Acute respiratory failure with hypoxia: Secondary | ICD-10-CM | POA: Diagnosis present

## 2023-09-26 DIAGNOSIS — Z6841 Body Mass Index (BMI) 40.0 and over, adult: Secondary | ICD-10-CM

## 2023-09-26 DIAGNOSIS — G928 Other toxic encephalopathy: Secondary | ICD-10-CM | POA: Diagnosis present

## 2023-09-26 DIAGNOSIS — Z9884 Bariatric surgery status: Secondary | ICD-10-CM | POA: Diagnosis not present

## 2023-09-26 DIAGNOSIS — I5033 Acute on chronic diastolic (congestive) heart failure: Secondary | ICD-10-CM | POA: Diagnosis not present

## 2023-09-26 DIAGNOSIS — I10 Essential (primary) hypertension: Secondary | ICD-10-CM | POA: Diagnosis present

## 2023-09-26 DIAGNOSIS — I959 Hypotension, unspecified: Secondary | ICD-10-CM | POA: Diagnosis not present

## 2023-09-26 DIAGNOSIS — Y92008 Other place in unspecified non-institutional (private) residence as the place of occurrence of the external cause: Secondary | ICD-10-CM | POA: Diagnosis not present

## 2023-09-26 DIAGNOSIS — Z79899 Other long term (current) drug therapy: Secondary | ICD-10-CM | POA: Diagnosis not present

## 2023-09-26 DIAGNOSIS — Z882 Allergy status to sulfonamides status: Secondary | ICD-10-CM

## 2023-09-26 DIAGNOSIS — N179 Acute kidney failure, unspecified: Secondary | ICD-10-CM | POA: Diagnosis present

## 2023-09-26 DIAGNOSIS — Z7951 Long term (current) use of inhaled steroids: Secondary | ICD-10-CM | POA: Diagnosis not present

## 2023-09-26 DIAGNOSIS — J9621 Acute and chronic respiratory failure with hypoxia: Secondary | ICD-10-CM | POA: Diagnosis present

## 2023-09-26 DIAGNOSIS — E871 Hypo-osmolality and hyponatremia: Secondary | ICD-10-CM | POA: Diagnosis not present

## 2023-09-26 DIAGNOSIS — E875 Hyperkalemia: Secondary | ICD-10-CM | POA: Diagnosis not present

## 2023-09-26 DIAGNOSIS — E662 Morbid (severe) obesity with alveolar hypoventilation: Secondary | ICD-10-CM | POA: Diagnosis present

## 2023-09-26 DIAGNOSIS — J309 Allergic rhinitis, unspecified: Secondary | ICD-10-CM | POA: Diagnosis present

## 2023-09-26 DIAGNOSIS — I110 Hypertensive heart disease with heart failure: Secondary | ICD-10-CM | POA: Diagnosis present

## 2023-09-26 DIAGNOSIS — T424X5A Adverse effect of benzodiazepines, initial encounter: Secondary | ICD-10-CM | POA: Diagnosis present

## 2023-09-26 DIAGNOSIS — J4540 Moderate persistent asthma, uncomplicated: Secondary | ICD-10-CM | POA: Diagnosis present

## 2023-09-26 DIAGNOSIS — G9340 Encephalopathy, unspecified: Secondary | ICD-10-CM | POA: Diagnosis present

## 2023-09-26 DIAGNOSIS — E874 Mixed disorder of acid-base balance: Secondary | ICD-10-CM | POA: Diagnosis not present

## 2023-09-26 DIAGNOSIS — G8929 Other chronic pain: Secondary | ICD-10-CM | POA: Diagnosis present

## 2023-09-26 DIAGNOSIS — J9622 Acute and chronic respiratory failure with hypercapnia: Secondary | ICD-10-CM | POA: Diagnosis present

## 2023-09-26 DIAGNOSIS — D649 Anemia, unspecified: Secondary | ICD-10-CM | POA: Diagnosis present

## 2023-09-26 DIAGNOSIS — M545 Low back pain, unspecified: Secondary | ICD-10-CM | POA: Diagnosis present

## 2023-09-26 HISTORY — DX: Chronic respiratory failure with hypercapnia: J96.12

## 2023-09-26 LAB — URINALYSIS, ROUTINE W REFLEX MICROSCOPIC
Bacteria, UA: NONE SEEN
Bilirubin Urine: NEGATIVE
Glucose, UA: NEGATIVE mg/dL
Hgb urine dipstick: NEGATIVE
Ketones, ur: NEGATIVE mg/dL
Leukocytes,Ua: NEGATIVE
Nitrite: NEGATIVE
Protein, ur: NEGATIVE mg/dL
Specific Gravity, Urine: 1.019 (ref 1.005–1.030)
pH: 5 (ref 5.0–8.0)

## 2023-09-26 LAB — CBC WITH DIFFERENTIAL/PLATELET
Abs Immature Granulocytes: 0.06 10*3/uL (ref 0.00–0.07)
Basophils Absolute: 0 10*3/uL (ref 0.0–0.1)
Basophils Relative: 0 %
Eosinophils Absolute: 0.1 10*3/uL (ref 0.0–0.5)
Eosinophils Relative: 1 %
HCT: 37.3 % (ref 36.0–46.0)
Hemoglobin: 11 g/dL — ABNORMAL LOW (ref 12.0–15.0)
Immature Granulocytes: 1 %
Lymphocytes Relative: 23 %
Lymphs Abs: 2.5 10*3/uL (ref 0.7–4.0)
MCH: 29.1 pg (ref 26.0–34.0)
MCHC: 29.5 g/dL — ABNORMAL LOW (ref 30.0–36.0)
MCV: 98.7 fL (ref 80.0–100.0)
Monocytes Absolute: 0.8 10*3/uL (ref 0.1–1.0)
Monocytes Relative: 7 %
Neutro Abs: 7.2 10*3/uL (ref 1.7–7.7)
Neutrophils Relative %: 68 %
Platelets: 277 10*3/uL (ref 150–400)
RBC: 3.78 MIL/uL — ABNORMAL LOW (ref 3.87–5.11)
RDW: 11.9 % (ref 11.5–15.5)
WBC: 10.7 10*3/uL — ABNORMAL HIGH (ref 4.0–10.5)
nRBC: 0 % (ref 0.0–0.2)

## 2023-09-26 LAB — COMPREHENSIVE METABOLIC PANEL WITH GFR
ALT: 15 U/L (ref 0–44)
AST: 12 U/L — ABNORMAL LOW (ref 15–41)
Albumin: 3.5 g/dL (ref 3.5–5.0)
Alkaline Phosphatase: 82 U/L (ref 38–126)
Anion gap: 9 (ref 5–15)
BUN: 12 mg/dL (ref 8–23)
CO2: 36 mmol/L — ABNORMAL HIGH (ref 22–32)
Calcium: 9.9 mg/dL (ref 8.9–10.3)
Chloride: 97 mmol/L — ABNORMAL LOW (ref 98–111)
Creatinine, Ser: 0.97 mg/dL (ref 0.44–1.00)
GFR, Estimated: >60 mL/min (ref >60)
Glucose, Bld: 105 mg/dL — ABNORMAL HIGH (ref 70–99)
Potassium: 4.9 mmol/L (ref 3.5–5.1)
Sodium: 142 mmol/L (ref 135–145)
Total Bilirubin: 0.7 mg/dL (ref 0.0–1.2)
Total Protein: 6.9 g/dL (ref 6.5–8.1)

## 2023-09-26 LAB — BLOOD GAS, VENOUS
Acid-Base Excess: 12 mmol/L — ABNORMAL HIGH (ref 0.0–2.0)
Acid-Base Excess: 12.2 mmol/L — ABNORMAL HIGH (ref 0.0–2.0)
Bicarbonate: 42.3 mmol/L — ABNORMAL HIGH (ref 20.0–28.0)
Bicarbonate: 42.3 mmol/L — ABNORMAL HIGH (ref 20.0–28.0)
O2 Saturation: 92.9 %
O2 Saturation: 96.6 %
Patient temperature: 37
Patient temperature: 37
pCO2, Ven: 86 mmHg (ref 44–60)
pCO2, Ven: 84 mmHg (ref 44–60)
pH, Ven: 7.3 (ref 7.25–7.43)
pH, Ven: 7.31 (ref 7.25–7.43)
pO2, Ven: 61 mmHg — ABNORMAL HIGH (ref 32–45)
pO2, Ven: 76 mmHg — ABNORMAL HIGH (ref 32–45)

## 2023-09-26 LAB — BRAIN NATRIURETIC PEPTIDE: B Natriuretic Peptide: 101.1 pg/mL — ABNORMAL HIGH (ref 0.0–100.0)

## 2023-09-26 MED ORDER — NALOXONE HCL 0.4 MG/ML IJ SOLN: 0.4000 mg | INTRAMUSCULAR | Status: DC | PRN

## 2023-09-26 MED ORDER — LORAZEPAM 2 MG/ML IJ SOLN: 1.0000 mg | Freq: Once | INTRAMUSCULAR | Status: DC

## 2023-09-26 MED ORDER — HYDROMORPHONE HCL 1 MG/ML IJ SOLN
0.5000 mg | Freq: Once | INTRAMUSCULAR | Status: AC
  Administered 2023-09-26: 0.5 mg via INTRAVENOUS
  Filled 2023-09-26: qty 1

## 2023-09-26 MED ORDER — ORAL CARE MOUTH RINSE
15.0000 mL | OROMUCOSAL | Status: DC | PRN
Start: 2023-09-26 — End: 2023-10-04

## 2023-09-26 MED ORDER — LORAZEPAM 2 MG/ML IJ SOLN
0.5000 mg | Freq: Once | INTRAMUSCULAR | Status: AC
  Administered 2023-09-26: 0.5 mg via INTRAVENOUS
  Filled 2023-09-26: qty 1

## 2023-09-26 MED ORDER — CHLORHEXIDINE GLUCONATE CLOTH 2 % EX PADS
6.0000 | MEDICATED_PAD | Freq: Every day | CUTANEOUS | Status: DC
  Administered 2023-09-28 – 2023-10-09 (×12): 6 via TOPICAL

## 2023-09-26 MED ORDER — ACETAMINOPHEN 650 MG RE SUPP: 650.0000 mg | Freq: Four times a day (QID) | RECTAL | Status: DC | PRN

## 2023-09-26 MED ORDER — MELATONIN 3 MG PO TABS
3.0000 mg | ORAL_TABLET | Freq: Every evening | ORAL | Status: DC | PRN
  Administered 2023-09-29 – 2023-10-07 (×8): 3 mg via ORAL
  Filled 2023-09-26 (×9): qty 1

## 2023-09-26 MED ORDER — PREDNISONE 10 MG PO TABS
20.0000 mg | ORAL_TABLET | Freq: Every day | ORAL | 0 refills | Status: DC
Start: 2023-09-26 — End: 2023-10-09

## 2023-09-26 MED ORDER — OXYCODONE-ACETAMINOPHEN 5-325 MG PO TABS: 2.0000 | ORAL_TABLET | ORAL | 0 refills | Status: DC | PRN

## 2023-09-26 MED ORDER — IPRATROPIUM-ALBUTEROL 0.5-2.5 (3) MG/3ML IN SOLN
3.0000 mL | Freq: Four times a day (QID) | RESPIRATORY_TRACT | Status: DC
  Administered 2023-09-27 (×4): 3 mL via RESPIRATORY_TRACT
  Filled 2023-09-26 (×4): qty 3

## 2023-09-26 MED ORDER — ONDANSETRON HCL 4 MG/2ML IJ SOLN: 4.0000 mg | Freq: Four times a day (QID) | INTRAMUSCULAR | Status: DC | PRN

## 2023-09-26 MED ORDER — OXYCODONE-ACETAMINOPHEN 5-325 MG PO TABS: ORAL_TABLET | ORAL | 0 refills | Status: DC

## 2023-09-26 MED ORDER — ACETAMINOPHEN 325 MG PO TABS
650.0000 mg | ORAL_TABLET | Freq: Four times a day (QID) | ORAL | Status: DC | PRN
  Administered 2023-09-27 – 2023-10-09 (×16): 650 mg via ORAL
  Filled 2023-09-26 (×17): qty 2

## 2023-09-26 MED ORDER — ALBUTEROL SULFATE (2.5 MG/3ML) 0.083% IN NEBU: 2.5000 mg | INHALATION_SOLUTION | RESPIRATORY_TRACT | Status: DC | PRN

## 2023-09-26 NOTE — Progress Notes (Signed)
   09/26/23 2118  BiPAP/CPAP/SIPAP  BiPAP/CPAP/SIPAP Pt Type Adult  BiPAP/CPAP/SIPAP SERVO  Mask Type Full face mask  Dentures removed? Not applicable  Mask Size Medium  Set Rate 16 breaths/min  Respiratory Rate 22 breaths/min  IPAP 12 cmH20  EPAP 6 cmH2O  Pressure Support 6 cmH20  PEEP 6 cmH20  FiO2 (%) 40 %  Flow Rate 0 lpm  Minute Ventilation 8.1  Leak 65  Peak Inspiratory Pressure (PIP) 13  Tidal Volume (Vt) 358  Patient Home Machine No  Patient Home Mask No  Auto Titrate No  Press High Alarm 30 cmH2O  Press Low Alarm 5 cmH2O  Device Plugged into RED Power Outlet Yes  Oxygen Percent 40 %

## 2023-09-26 NOTE — Telephone Encounter (Signed)
 Report to Emergency Department/Urgent Care/call 911 for immediate medical evaluation.

## 2023-09-26 NOTE — ED Notes (Signed)
 Critical value called from lab. pCO2 86. Dr. Melvenia notified. JRPRN

## 2023-09-26 NOTE — ED Notes (Signed)
 Respiratory called to inform of Bipap order. JRPRN

## 2023-09-26 NOTE — H&P (Signed)
 History and Physical      Janice Ryan FMW:[id number] DOB: [birth year] DOA: 09/26/2023; DOS: 09/26/2023  PCP: Lorren Greig PARAS, NP *** Patient coming from: home ***  I have personally briefly reviewed patient's old medical records in [facility]  Chief Complaint: ***  HPI: Janice Ryan is a 68 y.o. female with medical history significant for *** who is admitted to [facility] on 09/26/2023 with *** after presenting from home*** to [facility] ED complaining of ***.   ***        ***  ED Course:  Vital signs in the ED were notable for the following: ***  Labs were notable for the following: ***  Per my interpretation, EKG in ED demonstrated the following:  ***  Imaging in the ED, per corresponding formal radiology read, was notable for the following: ***  While in the ED, the following were administered: ***  Subsequently, the patient was admitted  ***  ***red   Review of Systems: As per HPI otherwise 10 point review of systems negative.   Past Medical History:  Diagnosis Date   Acute respiratory failure with hypoxia (HCC)    Asthma    Hypertension    Morbid obesity (HCC)    Obesity with alveolar hypoventilation and body mass index (BMI) of 40 or greater (HCC)    OSA (obstructive sleep apnea)     Past Surgical History:  Procedure Laterality Date   HERNIA REPAIR     JOINT REPLACEMENT Left    knee    Social History:  reports that she has never smoked. She has never used smokeless tobacco. She reports that she does not drink alcohol  and does not use drugs.   Allergies  Allergen Reactions   Neurontin  [Gabapentin ] Other (See Comments)    Has un-controlled movement of her body, excessive dribble of saliva at night   Sulfa Antibiotics Nausea And Vomiting and Other (See Comments)    Stomach pain    Family History  Problem Relation Age of Onset   Atrial fibrillation Mother    Congestive Heart Failure Father     Family history  reviewed and not pertinent ***   Prior to Admission medications   Medication Sig Start Date End Date Taking? Authorizing Provider  predniSONE  (DELTASONE ) 10 MG tablet Take 2 tablets (20 mg total) by mouth daily. 09/26/23  Yes Suzette Pac, MD  acetaminophen  (TYLENOL ) 500 MG tablet Take 2 tablets (1,000 mg total) by mouth every 6 (six) hours as needed for mild pain (pain score 1-3). 09/13/23   Will Almarie MATSU, MD  albuterol  (VENTOLIN  HFA) 108 [phone] Base) MCG/ACT inhaler Inhale 2 puffs into the lungs every 6 (six) hours as needed for wheezing or shortness of breath. 09/13/23   Will Almarie MATSU, MD  amoxicillin -clavulanate (AUGMENTIN ) 875-125 MG tablet Take 1 tablet by mouth 2 (two) times daily. 09/13/23   Will Almarie MATSU, MD  amphetamine-dextroamphetamine (ADDERALL XR) 20 MG 24 hr capsule Take 20 mg by mouth daily.    [provider]  busPIRone  (BUSPAR ) 15 MG tablet Take 15 mg by mouth 3 (three) times daily.    [provider]  cetirizine  (ZYRTEC  ALLERGY) 10 MG tablet Take 1 tablet (10 mg total) by mouth daily. 01/03/23   Lorren Greig PARAS, NP  cholecalciferol  (VITAMIN D3) 25 MCG (1000 UNIT) tablet Take 1,000 Units by mouth daily.    [provider]  doxepin  (SINEQUAN ) 50 MG capsule Take 1 capsule (50 mg total) by mouth in the morning.  07/26/23   Odell Balls, PA-C  DULoxetine  (CYMBALTA ) 60 MG capsule Take 2 capsules (120 mg total) by mouth daily. Patient taking differently: Take 60 mg by mouth 2 (two) times daily. 02/15/23   Regalado, Belkys A, MD  fluticasone  (FLONASE ) 50 MCG/ACT nasal spray Place 2 sprays into both nostrils daily. 02/20/22   Donzetta Rollene BRAVO, MD  fluticasone -salmeterol (ADVAIR) 500-50 MCG/ACT AEPB Inhale 1 puff into the lungs in the morning and at bedtime. 08/01/22   Mayers, Cari S, PA-C  gabapentin  (NEURONTIN ) 300 MG capsule Take 1 capsule (300 mg total) by mouth at bedtime. 08/20/23 11/18/23  Lorren Greig PARAS, NP  lisinopril  (ZESTRIL ) 20 MG tablet  Take 1 tablet (20 mg total) by mouth daily. 09/13/23   Will Almarie MATSU, MD  meloxicam  (MOBIC ) 15 MG tablet Take 15 mg by mouth in the morning and at bedtime.    [provider]  mirtazapine  (REMERON ) 15 MG tablet Take 1 tablet (15 mg total) by mouth at bedtime. 07/26/23   Odell Balls, PA-C  montelukast  (SINGULAIR ) 10 MG tablet Take 1 tablet (10 mg total) by mouth at bedtime. 01/16/23   Lorren Greig PARAS, NP  Multiple Vitamin (MULTIVITAMIN WITH MINERALS) TABS tablet Take 1 tablet by mouth daily.    [provider]  Omega-3 Fatty Acids (OMEGA-3 PO) Take 1 capsule by mouth daily.    [provider]  oxyCODONE  (OXY IR/ROXICODONE ) 5 MG immediate release tablet Take 1 tablet (5 mg total) by mouth every 6 (six) hours as needed for severe pain (pain score 7-10). 09/13/23   Will Almarie MATSU, MD  oxyCODONE -acetaminophen  (PERCOCET) 5-325 MG tablet Take 1 every 6 hours for pain not relieved by Tylenol  or Motrin  along 09/26/23   Zammit, Joseph, MD  OXYGEN Inhale 5 L into the lungs continuous.    [provider]  pantoprazole  (PROTONIX ) 40 MG tablet Take 1 tablet (40 mg total) by mouth daily. 03/07/23   Elgergawy, Brayton RAMAN, MD  traZODone  (DESYREL ) 50 MG tablet Take 50 mg by mouth at bedtime as needed for sleep.    [provider]     Objective    Physical Exam: Vitals:   09/26/23 1545 09/26/23 1600 09/26/23 1900 09/26/23 1920  BP:   (!) 156/88   Pulse: 98 95 85 88  Resp:   18   Temp:    98.9 F (37.2 C)  TempSrc:    Axillary  SpO2: 93% 93% 96% 95%  Weight:      Height:        General: appears to be stated age; alert, oriented Skin: warm, dry, no rash Head:  AT/ Mouth:  Oral mucosa membranes appear moist, normal dentition Neck: supple; trachea midline Heart:  RRR; did not appreciate any M/R/G Lungs: CTAB, did not appreciate any wheezes, rales, or rhonchi Abdomen: + BS; soft, ND, NT Vascular: 2+ pedal pulses b/l; 2+ radial pulses  b/l Extremities: no peripheral edema, no muscle wasting Neuro: strength and sensation intact in upper and lower extremities b/l    *** Neuro: 5/5 strength of the proximal and distal flexors and extensors of the upper and lower extremities bilaterally; sensation intact in upper and lower extremities b/l; cranial nerves II through XII grossly intact; no pronator drift; no evidence suggestive of slurred speech, dysarthria, or facial droop; Normal muscle tone. No tremors. *** Neuro: In the setting of the patient's current mental status and associated inability to follow instructions, unable to perform full neurologic exam at this time.  As  such, assessment of strength, sensation, and cranial nerves is limited at this time. Patient noted to spontaneously move all 4 extremities. No tremors.  ***    Labs on Admission: I have personally reviewed following labs and imaging studies  CBC: Recent Labs  Lab 09/26/23 1117  WBC 10.7*  NEUTROABS 7.2  HGB 11.0*  HCT 37.3  MCV 98.7  PLT 277   Basic Metabolic Panel: Recent Labs  Lab 09/26/23 1117  NA 142  K 4.9  CL 97*  CO2 36*  GLUCOSE 105*  BUN 12  CREATININE 0.97  CALCIUM 9.9   GFR: Estimated Creatinine Clearance: 99.8 mL/min (by C-G formula based on SCr of 0.97 mg/dL). Liver Function Tests: Recent Labs  Lab 09/26/23 1117  AST 12*  ALT 15  ALKPHOS 82  BILITOT 0.7  PROT 6.9  ALBUMIN 3.5   No results for input(s): LIPASE, AMYLASE in the last 168 hours. No results for input(s): AMMONIA in the last 168 hours. Coagulation Profile: No results for input(s): INR, PROTIME in the last 168 hours. Cardiac Enzymes: No results for input(s): CKTOTAL, CKMB, CKMBINDEX, TROPONINI in the last 168 hours. BNP (last 3 results) No results for input(s): PROBNP in the last 8760 hours. HbA1C: No results for input(s): HGBA1C in the last 72 hours. CBG: No results for input(s): GLUCAP in the last 168 hours. Lipid  Profile: No results for input(s): CHOL, HDL, LDLCALC, TRIG, CHOLHDL, LDLDIRECT in the last 72 hours. Thyroid Function Tests: No results for input(s): TSH, T4TOTAL, FREET4, T3FREE, THYROIDAB in the last 72 hours. Anemia Panel: No results for input(s): VITAMINB12, FOLATE, FERRITIN, TIBC, IRON, RETICCTPCT in the last 72 hours. Urine analysis:    Component Value Date/Time   COLORURINE YELLOW 09/26/2023 1132   APPEARANCEUR CLEAR 09/26/2023 1132   LABSPEC 1.019 09/26/2023 1132   PHURINE 5.0 09/26/2023 1132   GLUCOSEU NEGATIVE 09/26/2023 1132   HGBUR NEGATIVE 09/26/2023 1132   BILIRUBINUR NEGATIVE 09/26/2023 1132   KETONESUR NEGATIVE 09/26/2023 1132   PROTEINUR NEGATIVE 09/26/2023 1132   NITRITE NEGATIVE 09/26/2023 1132   LEUKOCYTESUR NEGATIVE 09/26/2023 1132    Radiological Exams on Admission: CT Hip Right Wo Contrast Result Date: 09/26/2023 CLINICAL DATA:  Right hip pain after fall. EXAM: CT OF THE RIGHT HIP WITHOUT CONTRAST TECHNIQUE: Multidetector CT imaging of the right hip was performed according to the standard protocol. Multiplanar CT image reconstructions were also generated. RADIATION DOSE REDUCTION: This exam was performed according to the departmental dose-optimization program which includes automated exposure control, adjustment of the mA and/or kV according to patient size and/or use of iterative reconstruction technique. COMPARISON:  CT pelvis dated 09/10/2023. FINDINGS: Bones/Joint/Cartilage No acute fracture or dislocation. The right femoral head is seated within the acetabulum. The right sacroiliac joint and pubic symphysis are intact with mild degenerative changes. Mild osteoarthritis of the right hip. Degenerative changes of the visualized lower lumbar spine. Ligaments Ligaments are suboptimally evaluated by CT. Muscles and Tendons Muscles are within normal limits. No intramuscular fluid collection or hematoma. Soft tissue No fluid collection or  hematoma.  Uterine fibroids again noted. IMPRESSION: 1. No acute osseous abnormality. 2. Mild osteoarthritis of the right hip. Electronically Signed   By: Harrietta Sherry M.D.   On: 09/26/2023 17:04   CT Head Wo Contrast Result Date: 09/26/2023 CLINICAL DATA:  Head trauma, intracranial arterial injury suspected EXAM: CT HEAD WITHOUT CONTRAST TECHNIQUE: Contiguous axial images were obtained from the base of the skull through the vertex without intravenous contrast. RADIATION DOSE REDUCTION: This  exam was performed according to the departmental dose-optimization program which includes automated exposure control, adjustment of the mA and/or kV according to patient size and/or use of iterative reconstruction technique. COMPARISON:  CT head September 10, 2023 FINDINGS: Brain: No evidence of acute infarction, hemorrhage, hydrocephalus, extra-axial collection or mass lesion/mass effect. Patchy white matter hypodensities, compatible with chronic microvascular disease. Similar scattered small intraparenchymal calcifications. Vascular: No hyperdense vessel identified. Calcific atherosclerosis. Skull: No acute fracture. Sinuses/Orbits: Clear sinuses.  No acute orbital findings. Other: No mastoid effusions. IMPRESSION: No evidence of acute intracranial abnormality. Electronically Signed   By: Gilmore GORMAN Molt M.D.   On: 09/26/2023 16:48   MR LUMBAR SPINE WO CONTRAST Result Date: 09/26/2023 CLINICAL DATA:  Myelopathy, acute, lumbar spine. Fall yesterday. Pain and numbness in the low back and legs. Right hip pain. EXAM: MRI LUMBAR SPINE WITHOUT CONTRAST TECHNIQUE: Multiplanar, multisequence MR imaging of the lumbar spine was performed. No intravenous contrast was administered. COMPARISON:  CT lumbar spine 09/10/2023 FINDINGS: The study is motion degraded, including severe motion on the axial T2 sequence. Segmentation: 4 lumbar vertebrae with completely sacralized L5. Alignment:  Exaggerated lumbar lordosis.  No listhesis.  Vertebrae: No fracture or suspicious marrow lesion. Mild left facet edema at L3-4, degenerative in appearance. Large hemangioma involving nearly the entirety of the L3 vertebral body. Small hemangioma at T11. Conus medullaris and cauda equina: Conus extends to the T12-L1 level. Conus and cauda equina appear normal. Paraspinal and other soft tissues: Unremarkable. Disc levels: T12-L1 and L1-2: Negative. L2-3: Disc desiccation and moderate disc space narrowing. Disc bulging and severe facet and ligamentum flavum hypertrophy result in mild-to-moderate spinal stenosis and mild bilateral neural foraminal stenosis. L3-4: Disc desiccation and mild disc space narrowing. Left eccentric disc bulging and severe left greater than right facet and ligamentum flavum hypertrophy result in mild-to-moderate spinal stenosis and mild-to-moderate left neural foraminal stenosis. L4-5: Disc desiccation and moderate disc space narrowing. Disc bulging and severe facet and ligamentum flavum hypertrophy result in mild-to-moderate spinal stenosis and severe right and moderate to severe left neural foraminal stenosis. L5-S1: Transitional anatomy with rudimentary disc.  No stenosis. IMPRESSION: 1. Transitional lumbosacral anatomy. 2. No acute osseous abnormality. 3. Severe lower lumbar facet arthrosis. 4. 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 5. Severe neural foraminal stenosis at L4-5. Electronically Signed   By: Dasie Hamburg M.D.   On: 09/26/2023 14:07   MR Cervical Spine Wo Contrast Result Date: 09/26/2023 CLINICAL DATA:  Ataxia, cervical trauma. Fall yesterday. Pain and numbness in the low back and legs. EXAM: MRI CERVICAL SPINE WITHOUT CONTRAST TECHNIQUE: Multiplanar, multisequence MR imaging of the cervical spine was performed. No intravenous contrast was administered. COMPARISON:  CT cervical spine 02/07/2023 FINDINGS: The study is motion degraded, including severe motion on 2 attempted sagittal STIR sequences and  on both axial sequences with the patient unable to tolerate any additional attempts at repeat imaging. Alignment: Cervical spine straightening.  No listhesis. Vertebrae: No fracture, suspicious marrow lesion, or significant marrow edema. Hemangiomas in the T1 and T3 vertebral bodies. Chronic degenerative endplate changes from C3-C7. Severe disc space narrowing at C4-5 and C5-6. Cord: Normal cord signal and morphology on the standard sagittal T2 sequence which is of good quality. Posterior Fossa, vertebral arteries, paraspinal tissues: Unremarkable included posterior fossa. Preserved vertebral artery flow voids. No prevertebral fluid or significant paraspinal soft tissue edema. Disc levels: Detailed characterization of degenerative changes is limited due to the degree of motion artifact on axial imaging. Mild spinal stenosis  at C4-5 and C5-6 due to broad-based posterior disc osteophyte complexes without cord compression. Suspected moderate multilevel neural foraminal stenosis which is poorly evaluated. IMPRESSION: 1. Severely motion degraded examination. No definite evidence of acute cervical spine injury. Normal appearance of the cervical spinal cord. 2. 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 Electronically Signed   By: Dasie Hamburg M.D.   On: 09/26/2023 13:56      Assessment/Plan    Principal Problem:   Acute hypoxic respiratory failure (HCC)  ***        ***                  ***                  ***                  ***                  ***                 ***                  ***                  ***                  ***                  ***                  ***                  ***                 ***     DVT prophylaxis: SCD's ***  Code Status:  Full code*** Family Communication: none*** Disposition Plan: Per Rounding Team Consults called: none***;  Admission status: ***    I SPENT GREATER THAN 75 *** MINUTES IN CLINICAL CARE TIME/MEDICAL DECISION-MAKING IN COMPLETING THIS ADMISSION.     Eva NOVAK Itzamara Casas DO [facility] From 7PM - 7AM   09/26/2023, 8:41 PM   ***

## 2023-09-26 NOTE — ED Notes (Signed)
 Resp Tech in to put patient on BIPAP.

## 2023-09-26 NOTE — ED Provider Notes (Signed)
 Care of patient assumed from Dr. Zammit.  This patient presents after a fall that occurred yesterday morning.  She has since had some paresthesias in her buttocks and pain radiating down right leg.  She underwent MRI of lumbar and cervical spine.  Results showed multiple areas of stenoses, particularly in the lower back.  She is awaiting CT imaging of head and hip. Physical Exam  BP (!) 144/94   Pulse (!) 102   Temp 98.9 F (37.2 C) (Oral)   Resp 20   Ht 5' 7 (1.702 m)   Wt (!) 184.6 kg   SpO2 90%   BMI 63.75 kg/m   Physical Exam Vitals and nursing note reviewed.  Constitutional:      General: She is not in acute distress.    Appearance: Normal appearance. She is well-developed. She is not toxic-appearing or diaphoretic.  HENT:     Head: Normocephalic and atraumatic.     Right Ear: External ear normal.     Left Ear: External ear normal.     Nose: Nose normal.     Mouth/Throat:     Mouth: Mucous membranes are moist.   Eyes:     Conjunctiva/sclera: Conjunctivae normal.    Cardiovascular:     Rate and Rhythm: Normal rate and regular rhythm.     Heart sounds: No murmur heard. Pulmonary:     Effort: No respiratory distress.     Breath sounds: Decreased breath sounds present. No wheezing or rales.  Abdominal:     General: There is no distension.     Palpations: Abdomen is soft.     Tenderness: There is no abdominal tenderness.   Musculoskeletal:        General: No swelling.     Cervical back: Neck supple.   Skin:    General: Skin is warm and dry.     Coloration: Skin is not jaundiced or pale.   Neurological:     Mental Status: She is disoriented.   Psychiatric:        Mood and Affect: Mood normal.        Behavior: Behavior normal.     Procedures  Procedures  ED Course / MDM    Medical Decision Making Amount and/or Complexity of Data Reviewed Labs: ordered. Radiology: ordered.  Risk Prescription drug management. Decision regarding  hospitalization.   CT imaging was unremarkable.  On assessment, patient is sleeping and difficult to arouse.  When she does awaken, she speaks nonsensically.  She falls asleep during conversation.  Blood gas was ordered and patient was found to be hypercarbic.  She was started on BiPAP.  I suspect the causes pickwickian syndrome in addition to sedating medications in the ED.  On repeat blood gas, patient showing only mild improvement in hypercarbia.  Will admit for continued BiPAP and observation.       Melvenia Motto, MD 09/26/23 [phone]

## 2023-09-26 NOTE — Progress Notes (Signed)
 Erroneous encounter-disregard

## 2023-09-26 NOTE — Telephone Encounter (Signed)
 FYI Only or Action Required?: FYI only for provider.  Patient was last seen in primary care on 06/25/2023 by Lorren Greig PARAS, NP. Called Nurse Triage reporting Fall, calling from ER. Symptoms began yesterday. Interventions attempted: Other: ER evaluation. Symptoms are: unchanged.  Triage Disposition: Call PCP Now  Patient/caregiver understands and will follow disposition?: Yes  Copied from CRM [id number]. Topic: Clinical - Red Word Triage >> Sep 26, 2023 11:46 AM Tobias CROME wrote: Red Word that prompted transfer to Nurse Triage:  Pt cancelled appt 09/26/23, pt states she fell last night and is at ER. Requesting to speak to triage Reason for Disposition  Call about patient who is currently hospitalized  Answer Assessment - Initial Assessment Questions 1. REASON FOR CALL or QUESTION: What is your reason for calling today? or How can I best help you? or What question do you have that I can help answer?     Inform Dr. Etta I am in the ER for a fall last night and please fill medications they prescribe. She states she requested refill of Tramadol  two weeks ago but that was never refilled.    Additional info: two weeks ago pneumonia dx in hospital. At ER now. Requesting Tramadol  qid refill and all other meds rx by ER. Patient will call back for follow up needs once discharged from the hospital.  Protocols used: PCP Call - No Triage-A-AH

## 2023-09-26 NOTE — ED Triage Notes (Signed)
 Pt BIB EMS from home with reports of a fall yesterday at 0500. Pt reports falling coming back from the bathroom, she reports falling backwards and hit her head. Pt has some pain and numbness in her lower back and lower legs. Pt complains of right hip pain. Pt started Trazodone  recently.

## 2023-09-26 NOTE — ED Provider Notes (Signed)
 [place] [facility]MENT AT [facility] Provider Note   CSN: [id number] Arrival date & time: 09/26/23  1020     Patient presents with: Fall   Janice Ryan is a 68 y.o. female.  {Add pertinent medical, surgical, social history, OB history to HPI:32947} Close has a history of hypertension chronic hip pain right knee pain.  Patient states she fell on the floor yesterday and is complaining of numbness in her back radiating down her right leg.  Patient also states that she has the back of her head but no loss of consciousness  The history is provided by the patient and medical records. No language interpreter was used.  Fall This is a new problem. The current episode started 12 to 24 hours ago. The problem occurs rarely. The problem has not changed since onset.Pertinent negatives include no chest pain, no abdominal pain and no headaches. Nothing aggravates the symptoms. Nothing relieves the symptoms. She has tried nothing for the symptoms.       Prior to Admission medications   Medication Sig Start Date End Date Taking? Authorizing Provider  acetaminophen  (TYLENOL ) 500 MG tablet Take 2 tablets (1,000 mg total) by mouth every 6 (six) hours as needed for mild pain (pain score 1-3). 09/13/23   Will Almarie MATSU, MD  albuterol  (VENTOLIN  HFA) 108 [phone] Base) MCG/ACT inhaler Inhale 2 puffs into the lungs every 6 (six) hours as needed for wheezing or shortness of breath. 09/13/23   Will Almarie MATSU, MD  amoxicillin -clavulanate (AUGMENTIN ) 875-125 MG tablet Take 1 tablet by mouth 2 (two) times daily. 09/13/23   Will Almarie MATSU, MD  amphetamine-dextroamphetamine (ADDERALL XR) 20 MG 24 hr capsule Take 20 mg by mouth daily.    [provider]  busPIRone  (BUSPAR ) 15 MG tablet Take 15 mg by mouth 3 (three) times daily.    [provider]  cetirizine  (ZYRTEC  ALLERGY) 10 MG tablet Take 1 tablet (10 mg total) by mouth daily. 01/03/23   Lorren Greig PARAS, NP   cholecalciferol  (VITAMIN D3) 25 MCG (1000 UNIT) tablet Take 1,000 Units by mouth daily.    [provider]  doxepin  (SINEQUAN ) 50 MG capsule Take 1 capsule (50 mg total) by mouth in the morning. 07/26/23   Odell Balls, PA-C  DULoxetine  (CYMBALTA ) 60 MG capsule Take 2 capsules (120 mg total) by mouth daily. Patient taking differently: Take 60 mg by mouth 2 (two) times daily. 02/15/23   Regalado, Belkys A, MD  fluticasone  (FLONASE ) 50 MCG/ACT nasal spray Place 2 sprays into both nostrils daily. 02/20/22   Donzetta Rollene BRAVO, MD  fluticasone -salmeterol (ADVAIR) 500-50 MCG/ACT AEPB Inhale 1 puff into the lungs in the morning and at bedtime. 08/01/22   Mayers, Cari S, PA-C  gabapentin  (NEURONTIN ) 300 MG capsule Take 1 capsule (300 mg total) by mouth at bedtime. 08/20/23 11/18/23  Lorren Greig PARAS, NP  lisinopril  (ZESTRIL ) 20 MG tablet Take 1 tablet (20 mg total) by mouth daily. 09/13/23   Will Almarie MATSU, MD  meloxicam  (MOBIC ) 15 MG tablet Take 15 mg by mouth in the morning and at bedtime.    [provider]  mirtazapine  (REMERON ) 15 MG tablet Take 1 tablet (15 mg total) by mouth at bedtime. 07/26/23   Odell Balls, PA-C  montelukast  (SINGULAIR ) 10 MG tablet Take 1 tablet (10 mg total) by mouth at bedtime. 01/16/23   Lorren Greig PARAS, NP  Multiple Vitamin (MULTIVITAMIN WITH MINERALS) TABS tablet Take 1 tablet by mouth daily.    [provider]  Omega-3 Fatty Acids (OMEGA-3 PO) Take 1 capsule by mouth daily.    [provider]  oxyCODONE  (OXY IR/ROXICODONE ) 5 MG immediate release tablet Take 1 tablet (5 mg total) by mouth every 6 (six) hours as needed for severe pain (pain score 7-10). 09/13/23   Will Almarie MATSU, MD  OXYGEN Inhale 5 L into the lungs continuous.    [provider]  pantoprazole  (PROTONIX ) 40 MG tablet Take 1 tablet (40 mg total) by mouth daily. 03/07/23   Elgergawy, Brayton RAMAN, MD  traZODone  (DESYREL ) 50 MG tablet Take 50 mg by mouth at  bedtime as needed for sleep.    [provider]    Allergies: Neurontin  [gabapentin ] and Sulfa antibiotics    Review of Systems  Constitutional:  Negative for appetite change and fatigue.  HENT:  Negative for congestion, ear discharge and sinus pressure.   Eyes:  Negative for discharge.  Respiratory:  Negative for cough.   Cardiovascular:  Negative for chest pain.  Gastrointestinal:  Negative for abdominal pain and diarrhea.  Genitourinary:  Negative for frequency and hematuria.  Musculoskeletal:  Negative for back pain.       Tender lumbar spine  Skin:  Negative for rash.  Neurological:  Negative for seizures and headaches.       Numbness in her buttocks and spine  Psychiatric/Behavioral:  Negative for hallucinations.     Updated Vital Signs BP (!) 121/102 (BP Location: Left Arm)   Pulse 88   Temp 98.5 F (36.9 C) (Oral)   Resp 20   Ht 5' 7 (1.702 m)   Wt (!) 184.6 kg   SpO2 92%   BMI 63.75 kg/m   Physical Exam Vitals and nursing note reviewed.  Constitutional:      Appearance: She is well-developed.  HENT:     Head: Normocephalic.     Nose: Nose normal.   Eyes:     General: No scleral icterus.    Conjunctiva/sclera: Conjunctivae normal.   Neck:     Thyroid: No thyromegaly.   Cardiovascular:     Rate and Rhythm: Normal rate and regular rhythm.     Heart sounds: No murmur heard.    No friction rub. No gallop.  Pulmonary:     Breath sounds: No stridor. No wheezing or rales.  Chest:     Chest wall: No tenderness.  Abdominal:     General: There is no distension.     Tenderness: There is no abdominal tenderness. There is no rebound.   Musculoskeletal:     Cervical back: Neck supple.     Comments: Mild tenderness to right hip with straight leg raise tenderness to lumbar area  Lymphadenopathy:     Cervical: No cervical adenopathy.   Skin:    Findings: No erythema or rash.   Neurological:     Mental Status: She is alert and oriented to person,  place, and time.     Motor: No abnormal muscle tone.     Coordination: Coordination normal.   Psychiatric:        Behavior: Behavior normal.     (all labs ordered are listed, but only abnormal results are displayed) Labs Reviewed  CBC WITH DIFFERENTIAL/PLATELET - Abnormal; Notable for the following components:      Result Value   WBC 10.7 (*)    RBC 3.78 (*)    Hemoglobin 11.0 (*)    MCHC 29.5 (*)    All other components within normal limits  COMPREHENSIVE METABOLIC PANEL WITH GFR - Abnormal; Notable for the following components:   Chloride 97 (*)    CO2 36 (*)    Glucose, Bld 105 (*)    AST 12 (*)    All other components within normal limits  URINALYSIS, ROUTINE W REFLEX MICROSCOPIC    EKG: None  Radiology: MR LUMBAR SPINE WO CONTRAST Result Date: 09/26/2023 CLINICAL DATA:  Myelopathy, acute, lumbar spine. Fall yesterday. Pain and numbness in the low back and legs. Right hip pain. EXAM: MRI LUMBAR SPINE WITHOUT CONTRAST TECHNIQUE: Multiplanar, multisequence MR imaging of the lumbar spine was performed. No intravenous contrast was administered. COMPARISON:  CT lumbar spine 09/10/2023 FINDINGS: The study is motion degraded, including severe motion on the axial T2 sequence. Segmentation: 4 lumbar vertebrae with completely sacralized L5. Alignment:  Exaggerated lumbar lordosis.  No listhesis. Vertebrae: No fracture or suspicious marrow lesion. Mild left facet edema at L3-4, degenerative in appearance. Large hemangioma involving nearly the entirety of the L3 vertebral body. Small hemangioma at T11. Conus medullaris and cauda equina: Conus extends to the T12-L1 level. Conus and cauda equina appear normal. Paraspinal and other soft tissues: Unremarkable. Disc levels: T12-L1 and L1-2: Negative. L2-3: Disc desiccation and moderate disc space narrowing. Disc bulging and severe facet and ligamentum flavum hypertrophy result in mild-to-moderate spinal stenosis and mild bilateral neural foraminal  stenosis. L3-4: Disc desiccation and mild disc space narrowing. Left eccentric disc bulging and severe left greater than right facet and ligamentum flavum hypertrophy result in mild-to-moderate spinal stenosis and mild-to-moderate left neural foraminal stenosis. L4-5: Disc desiccation and moderate disc space narrowing. Disc bulging and severe facet and ligamentum flavum hypertrophy result in mild-to-moderate spinal stenosis and severe right and moderate to severe left neural foraminal stenosis. L5-S1: Transitional anatomy with rudimentary disc.  No stenosis. IMPRESSION: 1. Transitional lumbosacral anatomy. 2. No acute osseous abnormality. 3. Severe lower lumbar facet arthrosis. 4. 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 5. Severe neural foraminal stenosis at L4-5. Electronically Signed   By: Dasie Hamburg M.D.   On: 09/26/2023 14:07   MR Cervical Spine Wo Contrast Result Date: 09/26/2023 CLINICAL DATA:  Ataxia, cervical trauma. Fall yesterday. Pain and numbness in the low back and legs. EXAM: MRI CERVICAL SPINE WITHOUT CONTRAST TECHNIQUE: Multiplanar, multisequence MR imaging of the cervical spine was performed. No intravenous contrast was administered. COMPARISON:  CT cervical spine 02/07/2023 FINDINGS: The study is motion degraded, including severe motion on 2 attempted sagittal STIR sequences and on both axial sequences with the patient unable to tolerate any additional attempts at repeat imaging. Alignment: Cervical spine straightening.  No listhesis. Vertebrae: No fracture, suspicious marrow lesion, or significant marrow edema. Hemangiomas in the T1 and T3 vertebral bodies. Chronic degenerative endplate changes from C3-C7. Severe disc space narrowing at C4-5 and C5-6. Cord: Normal cord signal and morphology on the standard sagittal T2 sequence which is of good quality. Posterior Fossa, vertebral arteries, paraspinal tissues: Unremarkable included posterior fossa. Preserved vertebral artery  flow voids. No prevertebral fluid or significant paraspinal soft tissue edema. Disc levels: Detailed characterization of degenerative changes is limited due to the degree of motion artifact on axial imaging. Mild spinal stenosis at C4-5 and C5-6 due to broad-based posterior disc osteophyte complexes without cord compression. Suspected moderate multilevel neural foraminal stenosis which is poorly evaluated. IMPRESSION: 1. Severely motion degraded examination. No definite evidence of acute cervical spine injury. Normal appearance of the cervical spinal cord. 2. 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 Electronically Signed   By: Dasie Hamburg M.D.   On: 09/26/2023 13:56    {Document cardiac monitor, telemetry assessment procedure when appropriate:32947} Procedures   Medications Ordered in the ED  LORazepam  (ATIVAN ) injection 0.5 mg (has no administration in time range)  HYDROmorphone (DILAUDID) injection 0.5 mg (0.5 mg Intravenous Given 09/26/23 1115)  LORazepam  (ATIVAN ) injection 0.5 mg (0.5 mg Intravenous Given 09/26/23 1243)     MRI of the lumbar spine and cervical spine negative. {Click here for ABCD2, HEART and other calculators REFRESH Note before signing:1}                              Medical Decision Making Amount and/or Complexity of Data Reviewed Labs: ordered. Radiology: ordered.  Risk Prescription drug management.   Patient with a fall and chronic pain in her right hip with paresthesias in the lumbar area.  {Document critical care time when appropriate  Document review of labs and clinical decision tools ie CHADS2VASC2, etc  Document your independent review of radiology images and any outside records  Document your discussion with family members, caretakers and with consultants  Document social determinants of health affecting pt's care  Document your decision making why or why not admission, treatments were needed:32947:::1}   Final diagnoses:   None    ED Discharge Orders     None

## 2023-09-27 ENCOUNTER — Encounter (HOSPITAL_COMMUNITY): Payer: Self-pay | Admitting: Internal Medicine

## 2023-09-27 DIAGNOSIS — M545 Low back pain, unspecified: Secondary | ICD-10-CM | POA: Diagnosis present

## 2023-09-27 DIAGNOSIS — J309 Allergic rhinitis, unspecified: Secondary | ICD-10-CM | POA: Diagnosis present

## 2023-09-27 DIAGNOSIS — G9340 Encephalopathy, unspecified: Secondary | ICD-10-CM | POA: Diagnosis present

## 2023-09-27 DIAGNOSIS — J9622 Acute and chronic respiratory failure with hypercapnia: Secondary | ICD-10-CM | POA: Diagnosis not present

## 2023-09-27 LAB — BLOOD GAS, VENOUS
Acid-Base Excess: 15.8 mmol/L — ABNORMAL HIGH (ref 0.0–2.0)
Acid-Base Excess: 17.9 mmol/L — ABNORMAL HIGH (ref 0.0–2.0)
Bicarbonate: 45.9 mmol/L — ABNORMAL HIGH (ref 20.0–28.0)
Bicarbonate: 43.7 mmol/L — ABNORMAL HIGH (ref 20.0–28.0)
O2 Saturation: 87.7 %
O2 Saturation: 98.8 %
Patient temperature: 37.1
Patient temperature: 37
pCO2, Ven: 85 mmHg (ref 44–60)
pCO2, Ven: 56 mmHg (ref 44–60)
pH, Ven: 7.34 (ref 7.25–7.43)
pH, Ven: 7.5 — ABNORMAL HIGH (ref 7.25–7.43)
pO2, Ven: 57 mmHg — ABNORMAL HIGH (ref 32–45)
pO2, Ven: 136 mmHg — ABNORMAL HIGH (ref 32–45)

## 2023-09-27 LAB — COMPREHENSIVE METABOLIC PANEL WITH GFR
ALT: 15 U/L (ref 0–44)
AST: 10 U/L — ABNORMAL LOW (ref 15–41)
Albumin: 3.3 g/dL — ABNORMAL LOW (ref 3.5–5.0)
Alkaline Phosphatase: 79 U/L (ref 38–126)
Anion gap: 9 (ref 5–15)
BUN: 16 mg/dL (ref 8–23)
CO2: 36 mmol/L — ABNORMAL HIGH (ref 22–32)
Calcium: 9.6 mg/dL (ref 8.9–10.3)
Chloride: 94 mmol/L — ABNORMAL LOW (ref 98–111)
Creatinine, Ser: 1.26 mg/dL — ABNORMAL HIGH (ref 0.44–1.00)
GFR, Estimated: 47 mL/min — ABNORMAL LOW (ref >60)
Glucose, Bld: 116 mg/dL — ABNORMAL HIGH (ref 70–99)
Potassium: 4.7 mmol/L (ref 3.5–5.1)
Sodium: 139 mmol/L (ref 135–145)
Total Bilirubin: 0.6 mg/dL (ref 0.0–1.2)
Total Protein: 6.7 g/dL (ref 6.5–8.1)

## 2023-09-27 LAB — CBC WITH DIFFERENTIAL/PLATELET
Abs Immature Granulocytes: 0.06 10*3/uL (ref 0.00–0.07)
Basophils Absolute: 0 10*3/uL (ref 0.0–0.1)
Basophils Relative: 0 %
Eosinophils Absolute: 0.2 10*3/uL (ref 0.0–0.5)
Eosinophils Relative: 2 %
HCT: 36.3 % (ref 36.0–46.0)
Hemoglobin: 10.4 g/dL — ABNORMAL LOW (ref 12.0–15.0)
Immature Granulocytes: 1 %
Lymphocytes Relative: 24 %
Lymphs Abs: 2.5 10*3/uL (ref 0.7–4.0)
MCH: 28.5 pg (ref 26.0–34.0)
MCHC: 28.7 g/dL — ABNORMAL LOW (ref 30.0–36.0)
MCV: 99.5 fL (ref 80.0–100.0)
Monocytes Absolute: 1.1 10*3/uL — ABNORMAL HIGH (ref 0.1–1.0)
Monocytes Relative: 10 %
Neutro Abs: 6.7 10*3/uL (ref 1.7–7.7)
Neutrophils Relative %: 63 %
Platelets: 273 10*3/uL (ref 150–400)
RBC: 3.65 MIL/uL — ABNORMAL LOW (ref 3.87–5.11)
RDW: 12.1 % (ref 11.5–15.5)
WBC: 10.5 10*3/uL (ref 4.0–10.5)
nRBC: 0 % (ref 0.0–0.2)

## 2023-09-27 LAB — PHOSPHORUS: Phosphorus: 4 mg/dL (ref 2.5–4.6)

## 2023-09-27 LAB — MAGNESIUM: Magnesium: 2.2 mg/dL (ref 1.7–2.4)

## 2023-09-27 LAB — PROCALCITONIN: Procalcitonin: <0.1 ng/mL

## 2023-09-27 LAB — TSH: TSH: 0.225 u[IU]/mL — ABNORMAL LOW (ref 0.350–4.500)

## 2023-09-27 MED ORDER — ENOXAPARIN SODIUM 40 MG/0.4ML IJ SOSY
40.0000 mg | PREFILLED_SYRINGE | INTRAMUSCULAR | Status: DC
  Administered 2023-09-27: 40 mg via SUBCUTANEOUS
  Filled 2023-09-27: qty 0.4

## 2023-09-27 MED ORDER — GABAPENTIN 300 MG PO CAPS
300.0000 mg | ORAL_CAPSULE | Freq: Every day | ORAL | Status: DC
  Administered 2023-09-27 – 2023-10-08 (×12): 300 mg via ORAL
  Filled 2023-09-27 (×12): qty 1

## 2023-09-27 MED ORDER — OXYCODONE HCL 5 MG PO TABS
5.0000 mg | ORAL_TABLET | Freq: Four times a day (QID) | ORAL | Status: DC | PRN
  Administered 2023-09-27 – 2023-10-02 (×11): 5 mg via ORAL
  Filled 2023-09-27 (×11): qty 1

## 2023-09-27 MED ORDER — MORPHINE SULFATE (PF) 2 MG/ML IV SOLN
2.0000 mg | Freq: Once | INTRAVENOUS | Status: AC
  Administered 2023-09-27: 2 mg via INTRAVENOUS
  Filled 2023-09-27: qty 1

## 2023-09-27 MED ORDER — LIDOCAINE 5 % EX PTCH
1.0000 | MEDICATED_PATCH | CUTANEOUS | Status: DC
  Administered 2023-09-27 – 2023-10-09 (×13): 1 via TRANSDERMAL
  Filled 2023-09-27 (×15): qty 1

## 2023-09-27 MED ORDER — FLUTICASONE PROPIONATE 50 MCG/ACT NA SUSP
2.0000 | Freq: Every day | NASAL | Status: DC
  Administered 2023-09-27 – 2023-10-09 (×8): 2 via NASAL
  Filled 2023-09-27 (×3): qty 16

## 2023-09-27 MED ORDER — PANTOPRAZOLE SODIUM 40 MG PO TBEC
40.0000 mg | DELAYED_RELEASE_TABLET | Freq: Every day | ORAL | Status: DC
  Administered 2023-09-27 – 2023-10-09 (×11): 40 mg via ORAL
  Filled 2023-09-27 (×12): qty 1

## 2023-09-27 MED ORDER — LISINOPRIL 10 MG PO TABS
20.0000 mg | ORAL_TABLET | Freq: Every day | ORAL | Status: DC
  Administered 2023-09-27: 20 mg via ORAL
  Filled 2023-09-27: qty 2

## 2023-09-27 MED ORDER — DULOXETINE HCL 30 MG PO CPEP
60.0000 mg | ORAL_CAPSULE | Freq: Two times a day (BID) | ORAL | Status: DC
  Administered 2023-09-27 – 2023-09-29 (×5): 60 mg via ORAL
  Filled 2023-09-27 (×5): qty 2

## 2023-09-27 MED ORDER — FENTANYL CITRATE PF 50 MCG/ML IJ SOSY
12.5000 ug | PREFILLED_SYRINGE | INTRAMUSCULAR | Status: DC | PRN
  Administered 2023-09-27: 12.5 ug via INTRAVENOUS
  Filled 2023-09-27: qty 1

## 2023-09-27 MED ORDER — FLUTICASONE FUROATE-VILANTEROL 200-25 MCG/ACT IN AEPB
1.0000 | INHALATION_SPRAY | Freq: Every day | RESPIRATORY_TRACT | Status: DC
  Administered 2023-09-27 – 2023-10-09 (×12): 1 via RESPIRATORY_TRACT
  Filled 2023-09-27 (×2): qty 28

## 2023-09-27 MED ORDER — SODIUM CHLORIDE 0.9 % IV BOLUS
500.0000 mL | Freq: Once | INTRAVENOUS | Status: AC
  Administered 2023-09-27: 500 mL via INTRAVENOUS

## 2023-09-27 MED ORDER — MONTELUKAST SODIUM 10 MG PO TABS
10.0000 mg | ORAL_TABLET | Freq: Every day | ORAL | Status: DC
  Administered 2023-09-27 – 2023-10-08 (×12): 10 mg via ORAL
  Filled 2023-09-27 (×12): qty 1

## 2023-09-27 NOTE — Progress Notes (Signed)
 Orthopedic Tech Progress Note Patient Details:  Janice Ryan 30-Mar-1956 [id number]  Patient ID: Janice Ryan, female   DOB: 1955/09/10, 68 y.o.   MRN: [id number]  Janice Ryan Loving 09/27/2023, 11:42 AM Knee immobilizer placed in room for OOB use when with PT

## 2023-09-27 NOTE — Evaluation (Signed)
 Physical Therapy Evaluation Patient Details Name: Janice Ryan MRN: [id number] DOB: 10-23-1955 Today's Date: 09/27/2023  History of Present Illness  Janice Ryan is a 68 y.o. female with medical history significant for chronic hypoxic hypercapnic respiratory failure, obstructive sleep apnea, moderate persistent asthma, essential hypertension, generalized anxiety disorder, allergic rhinitis, who is admitted to [facility] on 09/26/2023 with acute on chronic hypoxic hypercapnic respiratory failure after presenting from home to [facility] ED complaining of right hip pain.  Clinical Impression  Pt admitted with above diagnosis.  Pt currently with functional limitations due to the deficits listed below (see PT Problem List). Pt will benefit from acute skilled PT to increase their independence and safety with mobility to allow discharge.     The patient is motivated to mobilize OOB. Patient assisted to sitting with min assistance, Patient stood x ` 5  at [facility]  for hygiene by nursing. Patient able to pivot to recliner  x ~ 5 steps using RW. The patient  was ambulatory with rollator at baseline using KI since previous admit 2 weeks ago. SPO2 on 10 L HFNC  88-90%, RR 33  HR 90's. Patient will benefit from continued inpatient follow up therapy, <3 hours/day       If plan is discharge home, recommend the following: Two people to help with walking and/or transfers;A lot of help with bathing/dressing/bathroom;Assistance with cooking/housework;Assist for transportation;Help with stairs or ramp for entrance   Can travel by private vehicle        Equipment Recommendations Rolling walker (2 wheels) (bari RW)  Recommendations for Other Services       Functional Status Assessment Patient has had a recent decline in their functional status and demonstrates the ability to make significant improvements in function in a reasonable and predictable amount of time.     Precautions / Restrictions  Precautions Precautions: Fall, On HFNC, BiPAP when resting Required Braces or Orthoses: Knee Immobilizer - Right Knee Immobilizer - Right: On when out of bed or walking Restrictions Weight Bearing Restrictions Per Provider Order: No      Mobility  Bed Mobility   Bed Mobility: Supine to Sit     Supine to sit: HOB elevated, Used rails, Min assist     General bed mobility comments: min A for both legs, momentum to power up to sitting using rail  with KI donned    Transfers Overall transfer level: Needs assistance Equipment used: Rolling walker (2 wheels) Transfers: Sit to/from Stand Sit to Stand: +2 safety/equipment, +2 physical assistance, Min assist           General transfer comment: min assist to power up to stand at RW, min +2 safety to step  pivot to recliner    Ambulation/Gait                  Stairs            Wheelchair Mobility     Tilt Bed    Modified Rankin (Stroke Patients Only)       Balance Overall balance assessment: History of Falls, Needs assistance Sitting-balance support: Feet supported Sitting balance-Leahy Scale: Good     Standing balance support: Bilateral upper extremity supported, During functional activity, Reliant on assistive device for balance Standing balance-Leahy Scale: Poor Standing balance comment: fatigues easily and R knee instability                             Pertinent Vitals/Pain  Pain Assessment Pain Score: 8  Pain Location: R knee Pain Descriptors / Indicators: Aching, Constant, Contraction, Discomfort, Grimacing, Moaning Pain Intervention(s): Monitored during session, Premedicated before session, Repositioned    Home Living Family/patient expects to be discharged to:: Private residence Living Arrangements: Spouse/significant other;Children Available Help at Discharge: Family;Available PRN/intermittently;Personal care attendant Type of Home: Apartment Home Access: Level entry        Home Layout: One level Home Equipment: Rollator (4 wheels);Wheelchair - manual;Shower seat;BSC/3in1 Additional Comments: patient reported she has a lady that comes to help 6 hours a week to help and son helps some.    Prior Function Prior Level of Function : Needs assist       Physical Assist : ADLs (physical);Mobility (physical) Mobility (physical): Bed mobility;Transfers;Gait ADLs (physical): Dressing;IADLs;Bathing Mobility Comments: discharged from snf and home therapy was just starting ADLs Comments: patient reported walking with rollator at home to get around and family and pca assist     Extremity/Trunk Assessment   Upper Extremity Assessment Upper Extremity Assessment: Overall WFL for tasks assessed    Lower Extremity Assessment Lower Extremity Assessment: RLE deficits/detail RLE Deficits / Details: KI in place    Cervical / Trunk Assessment Cervical / Trunk Assessment: Normal;Other exceptions Cervical / Trunk Exceptions: body habitus  Communication   Communication Communication: No apparent difficulties    Cognition Arousal: Alert     PT - Cognitive impairments: No apparent impairments                         Following commands: Intact       Cueing Cueing Techniques: Verbal cues     General Comments General comments (skin integrity, edema, etc.): on 10 ltrs HFO2  via [place] after Bi-PAP removed, with O2 sats 94-96% throughout session, HR remained 90-94 bpm during session    Exercises     Assessment/Plan    PT Assessment Patient needs continued PT services  PT Problem List Decreased strength;Decreased range of motion;Decreased activity tolerance;Decreased balance;Decreased mobility;Pain       PT Treatment Interventions DME instruction;Gait training;Functional mobility training;Therapeutic activities;Therapeutic exercise;Balance training;Neuromuscular re-education;Patient/family education    PT Goals (Current goals can be found in the Care  Plan section)  Acute Rehab PT Goals Patient Stated Goal: to be able to get stronger, loose weight and have R knee surgery PT Goal Formulation: With patient Time For Goal Achievement: 10/11/23 Potential to Achieve Goals: Good    Frequency Min 2X/week     Co-evaluation PT/OT/SLP Co-Evaluation/Treatment: Yes Reason for Co-Treatment: Complexity of the patient's impairments (multi-system involvement);Necessary to address cognition/behavior during functional activity;For patient/therapist safety PT goals addressed during session: Mobility/safety with mobility;Balance;Proper use of DME OT goals addressed during session: ADL's and self-care;Proper use of Adaptive equipment and DME       AM-PAC PT 6 Clicks Mobility  Outcome Measure Help needed turning from your back to your side while in a flat bed without using bedrails?: A Little Help needed moving from lying on your back to sitting on the side of a flat bed without using bedrails?: A Little Help needed moving to and from a bed to a chair (including a wheelchair)?: A Lot Help needed standing up from a chair using your arms (e.g., wheelchair or bedside chair)?: A Lot Help needed to walk in hospital room?: Total Help needed climbing 3-5 steps with a railing? : Total 6 Click Score: 12    End of Session Equipment Utilized During Treatment: Oxygen;Gait belt;Right knee immobilizer  Activity Tolerance: Patient tolerated treatment well Patient left: in chair;with chair alarm set;with call bell/phone within reach Nurse Communication: Mobility status;Other (comment) PT Visit Diagnosis: Unsteadiness on feet (R26.81);Other abnormalities of gait and mobility (R26.89);Repeated falls (R29.6);Muscle weakness (generalized) (M62.81);Difficulty in walking, not elsewhere classified (R26.2);Pain Pain - Right/Left: Right Pain - part of body: Knee;Leg    Time: 1433-1501 PT Time Calculation (min) (ACUTE ONLY): 28 min   Charges:   PT Evaluation $PT Eval  Low Complexity: 1 Low   PT General Charges $$ ACUTE PT VISIT: 1 Visit         Darice Potters PT Acute Rehabilitation Services Office [phone]   Potters Darice Norris 09/27/2023, 3:43 PM

## 2023-09-27 NOTE — Progress Notes (Signed)
 PROGRESS NOTE    Janice Ryan  FMW:[id number] DOB: Nov 25, 1955 DOA: 09/26/2023 PCP: Lorren Greig PARAS, NP    Brief Narrative:  Janice Ryan is a 68 y.o. female with medical history significant for chronic hypoxic hypercapnic respiratory failure-wears 5 L at home, obstructive sleep apnea, moderate persistent asthma, essential hypertension, generalized anxiety disorder, allergic rhinitis, who is admitted to [facility] on 09/26/2023 with acute on chronic hypoxic hypercapnic respiratory failure after presenting from home to [facility] ED complaining of right hip pain.    The patient reports that she experienced a ground-level fall after tripping while attempting to ambulate at home.  She notes that this fall occurred on 09/25/2023, and resulted in a ground-level fall in which the right hip with a pencil point of contact with the floor below.  Subsequent to this fall, she notes right hip discomfort, sharp, with radiation into the right groin, worse with attempts to bear weight on the right lower extremity.  She also notes exacerbation of her chronic low back discomfort in the absence of any new acute focal weakness involving the lower extremities or any acute focal numbness, paresthesias.    Assessment and Plan:  Acute on chronic hypoxic hypercapnic respiratory failure: In the setting of a documented history of chronic hypoxic respiratory failure on 5 L continuous nasal cannula as an outpatient, patient of chronic hypercapnic respiratory failure, for which she is intended to use BiPAP on a nocturnal basis, patient noted to become hypoxic oxygen saturations into the mid 60s on her baseline 5 L nasal cannula, with concern regarding potential increasing CO2 retention contributing to her somnolence.  In terms of baseline physiology contributing to her chronic hypoxic hypercapnic respiratory failure she likely has some pickwickian physiology in the setting of morbid obesity, along with obstructive  sleep apnea with suboptimal compliance on home BiPAP.   - Wean O2 back to 5 L nasal cannula with goal of 88-92       Acute encephalopathy: Somnolence after doses of Dilaudid and multiple doses of Ativan  in the ED suggestive of a toxic contribution from the central acting medications, leading to an additional hypoxic hypercapnic respiratory failure, as above.   - Resolved and patient appears to be at baseline      Acute right hip pain: Resulting from mechanical ground-level fall on 09/25/2023, with presenting CT of the right hip showed no evidence of acute osseous abnormality, including no evidence of acute fracture or dislocation.   -As needed oral medications     Acute on chronic low back pain: Relative to her chronic low back acute exacerbation over the last 1 to 2 days after ground-level mechanical fall on 09/25/2023.  MRI of the lumbar spine shows no evidence of acute osseous abnormality will showing multilevel mild to moderate spinal stenosis as well as severe neuroforaminal stenosis at L4-L5, potential symptomatic exacerbation of the latter in the setting of recent ground-level fall.  No evidence of overt red flag symptoms. - Lidoderm patch ordered -As needed oral pain medicine, will not be using IV pain medicine     Morbid obesity:  Estimated body mass index is 62.15 kg/m as calculated from the following:   Height as of this encounter: 5' 7.5 (1.715 m).   Weight as of this encounter: 182.7 kg.       Essential Hypertension:  -Resume lisinopril       Generalized anxiety disorder: documented h/o such. On scheduled Cymbalta  as well as scheduled BuSpar  as outpatient.   - These meds were  initially held due to somnolence but patient is no longer somnolent so we will restart medicines in a stepwise fashion     Allergic Rhinitis: - Resume home meds as able       During last hospitalization patient refused to go to skilled nursing facility as recommended by PT but currently patient is  agreeable to skilled nursing facility as long as is not [facility].  TOC consulted and patient has a qualifying stay from 2 weeks ago.  Patient medically stable to go       DVT prophylaxis: SCDs Start: 09/26/23 2031    Code Status: Full Code Family Communication:   Disposition Plan:  Level of care: Stepdown Status is: Inpatient     Consultants:  TOC  Subjective: Agreeable to go to rehab as long as it is not [facility]  Objective: Vitals:   09/27/23 0746 09/27/23 0754 09/27/23 0756 09/27/23 0800  BP:    (!) 168/71  Pulse:   94 94  Resp:   19 (!) 28  Temp:   98.4 F (36.9 C)   TempSrc:   Oral   SpO2: 93% 92% (!) 88% 91%  Weight:      Height:       No intake or output data in the 24 hours ending 09/27/23 0926 Filed Weights   09/26/23 1030 09/26/23 2300  Weight: (!) 184.6 kg (!) 182.7 kg    Examination:   General: Appearance:    Severely obese female in no acute distress     Lungs:     respirations unlabored, diminished  Heart:    Normal heart rate.    MS:   All extremities are intact.    Neurologic:   Awake, alert, oriented x 3. No apparent focal neurological           defect.        Data Reviewed: I have personally reviewed following labs and imaging studies  CBC: Recent Labs  Lab 09/26/23 1117 09/27/23 0102  WBC 10.7* 10.5  NEUTROABS 7.2 6.7  HGB 11.0* 10.4*  HCT 37.3 36.3  MCV 98.7 99.5  PLT 277 273   Basic Metabolic Panel: Recent Labs  Lab 09/26/23 1117 09/27/23 0102  NA 142 139  K 4.9 4.7  CL 97* 94*  CO2 36* 36*  GLUCOSE 105* 116*  BUN 12 16  CREATININE 0.97 1.26*  CALCIUM 9.9 9.6  MG  --  2.2  PHOS  --  4.0   GFR: Estimated Creatinine Clearance: 76.8 mL/min (A) (by C-G formula based on SCr of 1.26 mg/dL (H)). Liver Function Tests: Recent Labs  Lab 09/26/23 1117 09/27/23 0102  AST 12* 10*  ALT 15 15  ALKPHOS 82 79  BILITOT 0.7 0.6  PROT 6.9 6.7  ALBUMIN 3.5 3.3*   No results for input(s): LIPASE,  AMYLASE in the last 168 hours. No results for input(s): AMMONIA in the last 168 hours. Coagulation Profile: No results for input(s): INR, PROTIME in the last 168 hours. Cardiac Enzymes: No results for input(s): CKTOTAL, CKMB, CKMBINDEX, TROPONINI in the last 168 hours. BNP (last 3 results) No results for input(s): PROBNP in the last 8760 hours. HbA1C: No results for input(s): HGBA1C in the last 72 hours. CBG: No results for input(s): GLUCAP in the last 168 hours. Lipid Profile: No results for input(s): CHOL, HDL, LDLCALC, TRIG, CHOLHDL, LDLDIRECT in the last 72 hours. Thyroid Function Tests: Recent Labs    09/27/23 0655  TSH 0.225*   Anemia Panel:  No results for input(s): VITAMINB12, FOLATE, FERRITIN, TIBC, IRON, RETICCTPCT in the last 72 hours. Sepsis Labs: Recent Labs  Lab 09/27/23 0104  PROCALCITON <0.10    No results found for this or any previous visit (from the past 240 hours).       Radiology Studies: DG Chest Port 1 View Result Date: 09/26/2023 CLINICAL DATA:  Hypoxia EXAM: PORTABLE CHEST 1 VIEW COMPARISON:  09/12/2023, CT from 02/25/2023 FINDINGS: Cardiac shadow is enlarged but stable. Fullness in the hilar region on the right is noted which may be entirely related to vasculature although more prominent than that seen on the prior exam. No significant edema is noted. No focal confluent infiltrate is seen. No bony abnormality is noted. IMPRESSION: Fullness in the hilar regions which is likely vascular in nature when compared with prior CT from 02/25/2023 No acute abnormality noted. Electronically Signed   By: Oneil Devonshire M.D.   On: 09/26/2023 21:00   CT Hip Right Wo Contrast Result Date: 09/26/2023 CLINICAL DATA:  Right hip pain after fall. EXAM: CT OF THE RIGHT HIP WITHOUT CONTRAST TECHNIQUE: Multidetector CT imaging of the right hip was performed according to the standard protocol. Multiplanar CT image reconstructions  were also generated. RADIATION DOSE REDUCTION: This exam was performed according to the departmental dose-optimization program which includes automated exposure control, adjustment of the mA and/or kV according to patient size and/or use of iterative reconstruction technique. COMPARISON:  CT pelvis dated 09/10/2023. FINDINGS: Bones/Joint/Cartilage No acute fracture or dislocation. The right femoral head is seated within the acetabulum. The right sacroiliac joint and pubic symphysis are intact with mild degenerative changes. Mild osteoarthritis of the right hip. Degenerative changes of the visualized lower lumbar spine. Ligaments Ligaments are suboptimally evaluated by CT. Muscles and Tendons Muscles are within normal limits. No intramuscular fluid collection or hematoma. Soft tissue No fluid collection or hematoma.  Uterine fibroids again noted. IMPRESSION: 1. No acute osseous abnormality. 2. Mild osteoarthritis of the right hip. Electronically Signed   By: Harrietta Sherry M.D.   On: 09/26/2023 17:04   CT Head Wo Contrast Result Date: 09/26/2023 CLINICAL DATA:  Head trauma, intracranial arterial injury suspected EXAM: CT HEAD WITHOUT CONTRAST TECHNIQUE: Contiguous axial images were obtained from the base of the skull through the vertex without intravenous contrast. RADIATION DOSE REDUCTION: This exam was performed according to the departmental dose-optimization program which includes automated exposure control, adjustment of the mA and/or kV according to patient size and/or use of iterative reconstruction technique. COMPARISON:  CT head September 10, 2023 FINDINGS: Brain: No evidence of acute infarction, hemorrhage, hydrocephalus, extra-axial collection or mass lesion/mass effect. Patchy white matter hypodensities, compatible with chronic microvascular disease. Similar scattered small intraparenchymal calcifications. Vascular: No hyperdense vessel identified. Calcific atherosclerosis. Skull: No acute fracture.  Sinuses/Orbits: Clear sinuses.  No acute orbital findings. Other: No mastoid effusions. IMPRESSION: No evidence of acute intracranial abnormality. Electronically Signed   By: Gilmore GORMAN Molt M.D.   On: 09/26/2023 16:48   MR LUMBAR SPINE WO CONTRAST Result Date: 09/26/2023 CLINICAL DATA:  Myelopathy, acute, lumbar spine. Fall yesterday. Pain and numbness in the low back and legs. Right hip pain. EXAM: MRI LUMBAR SPINE WITHOUT CONTRAST TECHNIQUE: Multiplanar, multisequence MR imaging of the lumbar spine was performed. No intravenous contrast was administered. COMPARISON:  CT lumbar spine 09/10/2023 FINDINGS: The study is motion degraded, including severe motion on the axial T2 sequence. Segmentation: 4 lumbar vertebrae with completely sacralized L5. Alignment:  Exaggerated lumbar lordosis.  No listhesis. Vertebrae: No  fracture or suspicious marrow lesion. Mild left facet edema at L3-4, degenerative in appearance. Large hemangioma involving nearly the entirety of the L3 vertebral body. Small hemangioma at T11. Conus medullaris and cauda equina: Conus extends to the T12-L1 level. Conus and cauda equina appear normal. Paraspinal and other soft tissues: Unremarkable. Disc levels: T12-L1 and L1-2: Negative. L2-3: Disc desiccation and moderate disc space narrowing. Disc bulging and severe facet and ligamentum flavum hypertrophy result in mild-to-moderate spinal stenosis and mild bilateral neural foraminal stenosis. L3-4: Disc desiccation and mild disc space narrowing. Left eccentric disc bulging and severe left greater than right facet and ligamentum flavum hypertrophy result in mild-to-moderate spinal stenosis and mild-to-moderate left neural foraminal stenosis. L4-5: Disc desiccation and moderate disc space narrowing. Disc bulging and severe facet and ligamentum flavum hypertrophy result in mild-to-moderate spinal stenosis and severe right and moderate to severe left neural foraminal stenosis. L5-S1: Transitional  anatomy with rudimentary disc.  No stenosis. IMPRESSION: 1. Transitional lumbosacral anatomy. 2. No acute osseous abnormality. 3. Severe lower lumbar facet arthrosis. 4. 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 5. Severe neural foraminal stenosis at L4-5. Electronically Signed   By: Dasie Hamburg M.D.   On: 09/26/2023 14:07   MR Cervical Spine Wo Contrast Result Date: 09/26/2023 CLINICAL DATA:  Ataxia, cervical trauma. Fall yesterday. Pain and numbness in the low back and legs. EXAM: MRI CERVICAL SPINE WITHOUT CONTRAST TECHNIQUE: Multiplanar, multisequence MR imaging of the cervical spine was performed. No intravenous contrast was administered. COMPARISON:  CT cervical spine 02/07/2023 FINDINGS: The study is motion degraded, including severe motion on 2 attempted sagittal STIR sequences and on both axial sequences with the patient unable to tolerate any additional attempts at repeat imaging. Alignment: Cervical spine straightening.  No listhesis. Vertebrae: No fracture, suspicious marrow lesion, or significant marrow edema. Hemangiomas in the T1 and T3 vertebral bodies. Chronic degenerative endplate changes from C3-C7. Severe disc space narrowing at C4-5 and C5-6. Cord: Normal cord signal and morphology on the standard sagittal T2 sequence which is of good quality. Posterior Fossa, vertebral arteries, paraspinal tissues: Unremarkable included posterior fossa. Preserved vertebral artery flow voids. No prevertebral fluid or significant paraspinal soft tissue edema. Disc levels: Detailed characterization of degenerative changes is limited due to the degree of motion artifact on axial imaging. Mild spinal stenosis at C4-5 and C5-6 due to broad-based posterior disc osteophyte complexes without cord compression. Suspected moderate multilevel neural foraminal stenosis which is poorly evaluated. IMPRESSION: 1. Severely motion degraded examination. No definite evidence of acute cervical spine injury.  Normal appearance of the cervical spinal cord. 2. 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 Electronically Signed   By: Dasie Hamburg M.D.   On: 09/26/2023 13:56        Scheduled Meds:  Chlorhexidine  Gluconate Cloth  6 each Topical Daily   DULoxetine   60 mg Oral BID   fluticasone   2 spray Each Nare Daily   ipratropium-albuterol   3 mL Nebulization Q6H   lidocaine  1 patch Transdermal Q24H   Continuous Infusions:   LOS: 1 day    Time spent: 45 minutes spent on chart review, discussion with nursing staff, consultants, updating family and interview/physical exam; more than 50% of that time was spent in counseling and/or coordination of care.    Harlene RAYMOND Bowl, DO [facility] Available via Epic secure chat 7am-7pm After these hours, please refer to coverage provider listed on [email or web address] 09/27/2023, 9:26 AM

## 2023-09-27 NOTE — Telephone Encounter (Signed)
 Patient already at ED medication was refilled

## 2023-09-27 NOTE — TOC Initial Note (Signed)
 Transition of Care [facility]) - Initial/Assessment Note    Patient Details  Name: Janice Ryan MRN: [id number] Date of Birth: [birth year]  [facility] CM/SW Contact:    Jon ONEIDA Anon, RN Phone Number: 09/27/2023, 2:42 PM  Clinical Narrative:                 Pt is from home with spouse, but has son nearby for assistance as needed. NCM met with pt at bedside and she states she uses 5L O2 via [employer] continuously at baseline. Oxygen is supplied through [employer] Pt has bipap in the home and states she is not as compliant as needed. In most recent admission HHPT/OT was set up through [facility]. Pt has rollator and shower chair in the home. Pt denies any SDOH needs. PT/OT consulted, awaiting recommendations. TOC is following.   Expected Discharge Plan: Home w Home Health Services Barriers to Discharge: Continued Medical Work up   Patient Goals and CMS Choice Patient states their goals for this hospitalization and ongoing recovery are:: Home with home health CMS Medicare.gov Compare Post Acute Care list provided to:: Patient Choice offered to / list presented to : Patient [place] ownership interest in [facility].provided to:: Patient    Expected Discharge Plan and Services In-house Referral: Clinical Social Work Discharge Planning Services: CM Consult Post Acute Care Choice: Durable Medical Equipment, Home Health Living arrangements for the past 2 months: Apartment                 DME Arranged: N/A DME Agency: [employer] Date DME Agency Contacted: 09/27/23 Time DME Agency Contacted: [phone] Representative spoke with at DME Agency: Mitch with [employer]            Prior Living Arrangements/Services Living arrangements for the past 2 months: Apartment Lives with:: Spouse Patient language and need for interpreter reviewed:: Yes Do you feel safe going back to the place where you live?: Yes      Need for Family Participation in Patient Care: Yes  (Comment) Care giver support system in place?: Yes (comment) Current home services: DME (Rollator, Shower chair) Criminal Activity/Legal Involvement Pertinent to Current Situation/Hospitalization: No - Comment as needed  Activities of Daily Living   ADL Screening (condition at time of admission) Independently performs ADLs?: No Does the patient have a NEW difficulty with bathing/dressing/toileting/self-feeding that is expected to last >3 days?: Yes (Initiates electronic notice to provider for possible OT consult) Does the patient have a NEW difficulty with getting in/out of bed, walking, or climbing stairs that is expected to last >3 days?: Yes (Initiates electronic notice to provider for possible PT consult) Does the patient have a NEW difficulty with communication that is expected to last >3 days?: No Is the patient deaf or have difficulty hearing?: No Does the patient have difficulty seeing, even when wearing glasses/contacts?: No Does the patient have difficulty concentrating, remembering, or making decisions?: No  Permission Sought/Granted Permission sought to share information with : Family Supports Permission granted to share information with : Yes, Verbal Permission Granted  Share Information with NAME: Humphrey Hussar (Son)  [phone]           Emotional Assessment Appearance:: Appears stated age Attitude/Demeanor/Rapport: Engaged Affect (typically observed): Accepting, Appropriate Orientation: : Oriented to Self, Oriented to Place, Oriented to  Time, Oriented to Situation Alcohol  / Substance Use: Not Applicable Psych Involvement: No (comment)  Admission diagnosis:  Fall, initial encounter [W19.XXXA] Acute on chronic respiratory failure with hypercapnia (HCC) [  J96.22] Acute hypoxic respiratory failure (HCC) [J96.01] Patient Active Problem List   Diagnosis Date Noted   Low back pain 09/27/2023   Acute encephalopathy 09/27/2023   Allergic rhinitis 09/27/2023   Acute  on chronic respiratory failure with hypercapnia (HCC) 09/26/2023   Mild intermittent asthma 09/13/2023   CAP (community acquired pneumonia) 09/11/2023   Ground-level fall 09/11/2023   Acute right hip pain 09/11/2023   Mood disorder (HCC) 09/11/2023   Hypokalemia 02/25/2023   Morbid obesity (HCC) 02/25/2023   OSA (obstructive sleep apnea) 02/07/2023   Obesity hypoventilation syndrome (HCC) 02/07/2023   Asthma, chronic, unspecified asthma severity, with acute exacerbation 02/07/2023   Suspected novel influenza A virus infection 02/01/2022   AKI (acute kidney injury) (HCC) 01/25/2022   Hypercalcemia 01/24/2022   Pneumonia due to infectious organism 01/23/2022   Acute on chronic respiratory failure with hypoxia and hypercapnia (HCC) 01/21/2022   Fall at home, initial encounter 01/21/2022   Poor social situation 01/21/2022   Primary osteoarthritis of right knee 10/21/2019   Depression 03/31/2019   Major depressive disorder, recurrent episode, 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 [place] [address] Phone: [phone] Fax: [phone]  [facility] [address], [place] - [address] [place] [address] Phone: [phone] Fax: [phone]     Social Drivers of Health (SDOH) Social History: SDOH Screenings   Food Insecurity: No Food Insecurity (09/27/2023)  Housing: Unknown (09/27/2023)  Transportation  Needs: No Transportation Needs (09/27/2023)  Utilities: Not At Risk (09/27/2023)  Depression (PHQ2-9): Low Risk  (07/06/2021)  Social Connections: Socially Integrated (09/27/2023)  Tobacco Use: Low Risk  (09/27/2023)   SDOH Interventions:     Readmission Risk Interventions    09/27/2023    2:36 PM 03/06/2023   11:10 AM 02/08/2023    4:56 PM  Readmission Risk Prevention Plan  Post Dischage Appt   Complete  Medication Screening   Complete  Transportation Screening Complete Complete Complete  PCP or Specialist Appt within 5-7 Days Complete Complete   Home Care Screening Complete Complete   Medication Review (RN CM) Complete Complete

## 2023-09-27 NOTE — Evaluation (Signed)
 Occupational Therapy Evaluation Patient Details Name: Janice Ryan MRN: [id number] DOB: [birth year] Today's Date: 09/27/2023   History of Present Illness   Janice Ryan is a 68 y.o. female with medical history significant for chronic hypoxic hypercapnic respiratory failure, obstructive sleep apnea, moderate persistent asthma, essential hypertension, generalized anxiety disorder, allergic rhinitis, who is admitted to [facility] on 09/26/2023 with acute on chronic hypoxic hypercapnic respiratory failure after presenting from home to [facility] ED complaining of right hip pain.     Clinical Impressions PTA, patient had just been discharged from rehab and was beginning [facility] therapies in home with family and pca assist, using rollator short distances with ongoing R knee pain and use of KI and requiring assist for ADL's and IADL's. Currently, patient O2 dep with decreased activity tolerance, generalized muscle weakness and pain, balance, functional reach and mild higher level cog deficits (problem solving and organization) limiting ADL's and functional mobility. See below for VSR as patient able to mobilize this session with new KI applied to R LE and HDRW for light self care and to recliner with assist. Patient requires ongoing skilled OT while in Acute hospital setting to progress function. Patient will benefit from continued inpatient follow up therapy, <3 hours/day.       If plan is discharge home, recommend the following:   A little help with bathing/dressing/bathroom;Two people to help with walking and/or transfers;Assistance with cooking/housework;Assist for transportation;Help with stairs or ramp for entrance     Functional Status Assessment   Patient has had a recent decline in their functional status and demonstrates the ability to make significant improvements in function in a reasonable and predictable amount of time.     Equipment Recommendations   Tub/shower  bench;Other (comment) (Heavy Duty RW) if home vs rehab       Precautions/Restrictions   Precautions Precautions: Fall Required Braces or Orthoses: Knee Immobilizer - Right Knee Immobilizer - Right: On when out of bed or walking Restrictions Weight Bearing Restrictions Per Provider Order: No     Mobility Bed Mobility Overal bed mobility: Needs Assistance Bed Mobility: Supine to Sit     Supine to sit: HOB elevated, Min assist, Used rails     General bed mobility comments: assist for LE management off bed with KI in place    Transfers Overall transfer level: Needs assistance Equipment used: Rolling walker (2 wheels) Transfers: Sit to/from Stand, Bed to chair/wheelchair/BSC Sit to Stand: Mod assist, +2 physical assistance, +2 safety/equipment, From elevated surface     Step pivot transfers: Mod assist, +2 physical assistance, +2 safety/equipment, From elevated surface     General transfer comment: HDRW with +2 for safety and lines management, stood for buttocks hygiene and sacral pad placement for 2 minutes max      Balance Overall balance assessment: History of Falls, Needs assistance Sitting-balance support: Feet supported Sitting balance-Leahy Scale: Good     Standing balance support: Bilateral upper extremity supported, During functional activity, Reliant on assistive device for balance Standing balance-Leahy Scale: Poor Standing balance comment: fatigues easily and R knee instability                           ADL either performed or assessed with clinical judgement   ADL Overall ADL's : Needs assistance/impaired Eating/Feeding: Modified independent   Grooming: Set up;Sitting   Upper Body Bathing: Minimal assistance;Sitting   Lower Body Bathing: Maximal assistance;Sitting/lateral leans   Upper Body Dressing : Minimal assistance;Sitting  Lower Body Dressing: Maximal assistance;Sit to/from stand;+2 for physical assistance;+2 for  safety/equipment   Toilet Transfer: Moderate assistance;+2 for physical assistance;+2 for safety/equipment;Rolling walker (2 wheels)   Toileting- Clothing Manipulation and Hygiene: Moderate assistance;Sitting/lateral lean Toileting - Clothing Manipulation Details (indicate cue type and reason): d/t decreased functional reach and body habitus, has tongs at home     Functional mobility during ADLs: Moderate assistance;+2 for physical assistance;+2 for safety/equipment;Rolling walker (2 wheels) (HDRW) General ADL Comments: cues for pacing and breathing, decreased activity tolerance and functional reach     Vision Baseline Vision/History: 0 No visual deficits;1 Wears glasses Ability to See in Adequate Light: 0 Adequate Patient Visual Report: No change from baseline       Perception         Praxis         Pertinent Vitals/Pain Pain Assessment Pain Assessment: 0-10 Pain Score: 8  Pain Location: R knee Pain Descriptors / Indicators: Aching, Constant, Contraction, Discomfort, Grimacing, Moaning Pain Intervention(s): Monitored during session, Premedicated before session, Repositioned, Patient requesting pain meds-RN notified     Extremity/Trunk Assessment Upper Extremity Assessment Upper Extremity Assessment: Overall WFL for tasks assessed   Lower Extremity Assessment Lower Extremity Assessment: RLE deficits/detail RLE Deficits / Details: KI in place   Cervical / Trunk Assessment Cervical / Trunk Assessment: Normal;Other exceptions Cervical / Trunk Exceptions: body habitus   Communication Communication Communication: No apparent difficulties   Cognition Arousal: Alert Behavior During Therapy: WFL for tasks assessed/performed Cognition: Cognition impaired           Executive functioning impairment (select all impairments): Problem solving (higher level when in distracting setting) OT - Cognition Comments: mild processing delay                 Following  commands: Intact       Cueing  General Comments   Cueing Techniques: Verbal cues  on 10 ltrs HFO2  via [place] after Bi-PAP removed, with O2 sats 94-96% throughout session, HR remained 90-94 bpm during session           Home Living Family/patient expects to be discharged to:: Private residence Living Arrangements: Spouse/significant other;Children Available Help at Discharge: Family;Available PRN/intermittently;Personal care attendant Type of Home: Apartment Home Access: Level entry     Home Layout: One level     Bathroom Shower/Tub: Tub/shower unit;Curtain   Bathroom Toilet: Handicapped height Bathroom Accessibility: Yes How Accessible: Accessible via walker Home Equipment: Rollator (4 wheels);Wheelchair - manual;Shower seat;BSC/3in1   Additional Comments: patient reported she has a lady that comes to help 6 hours a week to help and son helps some.      Prior Functioning/Environment Prior Level of Function : Needs assist       Physical Assist : ADLs (physical);Mobility (physical) Mobility (physical): Bed mobility;Transfers;Gait ADLs (physical): Dressing;IADLs;Bathing Mobility Comments: discharged from snf and home therapy was just starting ADLs Comments: patient reported walking with rollator at home to get around and family and pca assist    OT Problem List: Decreased knowledge of precautions;Impaired balance (sitting and/or standing);Decreased safety awareness;Decreased activity tolerance;Obesity;Pain;Cardiopulmonary status limiting activity;Decreased strength   OT Treatment/Interventions: Self-care/ADL training;DME and/or AE instruction;Therapeutic activities;Balance training;Energy conservation;Patient/family education      OT Goals(Current goals can be found in the care plan section)   Acute Rehab OT Goals Patient Stated Goal: to get stronger OT Goal Formulation: With patient Time For Goal Achievement: 10/11/23 Potential to Achieve Goals: Fair ADL  Goals Pt Will Perform Lower Body Dressing: with min assist;with  adaptive equipment;sit to/from stand Pt Will Transfer to Toilet: with min assist;with +2 assist;bedside commode;ambulating Pt Will Perform Toileting - Clothing Manipulation and hygiene: with adaptive equipment;with mod assist;sit to/from stand Pt/caregiver will Perform Home Exercise Program: Both right and left upper extremity;Increased strength;Independently;With written HEP provided Additional ADL Goal #1: Patient will demonstrate teach back of ECT and breathing strategies dueing ADL' and mobility indepednently   OT Frequency:  Min 2X/week    Co-evaluation   Reason for Co-Treatment: Complexity of the patient's impairments (multi-system involvement);Necessary to address cognition/behavior during functional activity;For patient/therapist safety PT goals addressed during session: Mobility/safety with mobility;Balance;Proper use of DME OT goals addressed during session: ADL's and self-care;Proper use of Adaptive equipment and DME      AM-PAC OT 6 Clicks Daily Activity     Outcome Measure Help from another person eating meals?: None Help from another person taking care of personal grooming?: None Help from another person toileting, which includes using toliet, bedpan, or urinal?: A Lot Help from another person bathing (including washing, rinsing, drying)?: A Lot Help from another person to put on and taking off regular upper body clothing?: A Little Help from another person to put on and taking off regular lower body clothing?: A Lot 6 Click Score: 17   End of Session Equipment Utilized During Treatment: Oxygen;Right knee immobilizer;Rolling walker (2 wheels);Gait belt Nurse Communication: Mobility status;Need for lift equipment;Precautions;Weight bearing status;Other (comment) (nsg present)  Activity Tolerance: Patient tolerated treatment well Patient left: in chair;with call bell/phone within reach;with chair alarm  set;with nursing/sitter in room  OT Visit Diagnosis: Unsteadiness on feet (R26.81);Other abnormalities of gait and mobility (R26.89);Repeated falls (R29.6);Muscle weakness (generalized) (M62.81);History of falling (Z91.81);Pain Pain - Right/Left: Right Pain - part of body: Knee                Time: 8594-8548 OT Time Calculation (min): 46 min Charges:  OT General Charges $OT Visit: 1 Visit OT Evaluation $OT Eval Low Complexity: 1 Low OT Treatments $Therapeutic Activity: 23-37 mins  Janice Ryan OT/L Acute Rehabilitation Department  [phone]  09/27/2023, 3:43 PM

## 2023-09-27 NOTE — Progress Notes (Signed)
   09/27/23 2146  BiPAP/CPAP/SIPAP  BiPAP/CPAP/SIPAP Pt Type Adult  BiPAP/CPAP/SIPAP SERVO  Mask Type Full face mask  Dentures removed? Not applicable  Mask Size Medium  Set Rate 16 breaths/min  Respiratory Rate 20 breaths/min  IPAP 12 cmH20  EPAP 6 cmH2O  Pressure Support 6 cmH20  PEEP 6 cmH20  FiO2 (%) 40 %  Minute Ventilation 11.6  Leak 12  Peak Inspiratory Pressure (PIP) 18  Tidal Volume (Vt) 580  Patient Home Machine No  Patient Home Mask No  Patient Home Tubing No  Auto Titrate No  Press High Alarm 25 cmH2O  Press Low Alarm 4 cmH2O  CPAP/SIPAP surface wiped down Yes  Device Plugged into RED Power Outlet Yes  Oxygen Percent 40 %  BiPAP/CPAP /SiPAP Vitals  Bilateral Breath Sounds Diminished

## 2023-09-28 DIAGNOSIS — J9622 Acute and chronic respiratory failure with hypercapnia: Secondary | ICD-10-CM | POA: Diagnosis not present

## 2023-09-28 MED ORDER — IPRATROPIUM-ALBUTEROL 0.5-2.5 (3) MG/3ML IN SOLN
3.0000 mL | Freq: Three times a day (TID) | RESPIRATORY_TRACT | Status: DC
  Administered 2023-09-28 – 2023-10-09 (×34): 3 mL via RESPIRATORY_TRACT
  Filled 2023-09-28 (×33): qty 3

## 2023-09-28 MED ORDER — ENOXAPARIN SODIUM 100 MG/ML IJ SOSY
90.0000 mg | PREFILLED_SYRINGE | INTRAMUSCULAR | Status: DC
  Administered 2023-09-28: 90 mg via SUBCUTANEOUS
  Filled 2023-09-28: qty 1

## 2023-09-28 NOTE — Progress Notes (Signed)
 PROGRESS NOTE    Janice Ryan  FMW:[id number] DOB: 1955/11/18 DOA: 09/26/2023 PCP: Lorren Greig PARAS, NP    Brief Narrative:  Janice Ryan is a 68 y.o. female with medical history significant for chronic hypoxic hypercapnic respiratory failure-wears 5 L at home, obstructive sleep apnea, moderate persistent asthma, essential hypertension, generalized anxiety disorder, allergic rhinitis, who is admitted to [facility] on 09/26/2023 with acute on chronic hypoxic hypercapnic respiratory failure after presenting from home to [facility] ED complaining of right hip pain.    The patient reports that she experienced a ground-level fall after tripping while attempting to ambulate at home.  She notes that this fall occurred on 09/25/2023, and resulted in a ground-level fall in which the right hip with a pencil point of contact with the floor below.  Subsequent to this fall, she notes right hip discomfort, sharp, with radiation into the right groin, worse with attempts to bear weight on the right lower extremity.  She also notes exacerbation of her chronic low back discomfort in the absence of any new acute focal weakness involving the lower extremities or any acute focal numbness, paresthesias.    Assessment and Plan:  Acute on chronic hypoxic hypercapnic respiratory failure: In the setting of a documented history of chronic hypoxic respiratory failure on 5 L continuous nasal cannula as an outpatient, patient of chronic hypercapnic respiratory failure, for which she is intended to use BiPAP on a nocturnal basis, patient noted to become hypoxic oxygen saturations into the mid 60s on her baseline 5 L nasal cannula, with concern regarding potential increasing CO2 retention contributing to her somnolence.  I -she likely has some pickwickian physiology in the setting of morbid obesity, along with obstructive sleep apnea with suboptimal compliance on home BiPAP.   - Wean O2 back to 5 L nasal cannula  with goal of 88-92 -if unable to wean O2, may need CT scan of chest to rule out other etiologies      Acute encephalopathy: Somnolence after doses of Dilaudid  and multiple doses of Ativan  in the ED suggestive of a toxic contribution from the central acting medications, leading to an additional hypoxic hypercapnic respiratory failure, as above.   - Resolved and patient appears to be at baseline      Acute right hip pain: Resulting from mechanical ground-level fall on 09/25/2023, with presenting CT of the right hip showed no evidence of acute osseous abnormality, including no evidence of acute fracture or dislocation.   -As needed oral medications --would avoid IV narcotics    Acute on chronic low back pain: Relative to her chronic low back acute exacerbation over the last 1 to 2 days after ground-level mechanical fall on 09/25/2023.  MRI of the lumbar spine shows no evidence of acute osseous abnormality will showing multilevel mild to moderate spinal stenosis as well as severe neuroforaminal stenosis at L4-L5, potential symptomatic exacerbation of the latter in the setting of recent ground-level fall.  No evidence of overt red flag symptoms. - Lidoderm  patch ordered -As needed oral pain medicine, will not be using IV pain medicine     Morbid obesity:  Estimated body mass index is 61.85 kg/m as calculated from the following:   Height as of this encounter: 5' 8 (1.727 m).   Weight as of this encounter: 184.5 kg.       Essential Hypertension:  - Hold lisinopril  for low blood pressure      Generalized anxiety disorder: documented h/o such. On scheduled Cymbalta  as well  as scheduled BuSpar  as outpatient.   - These meds were initially held due to somnolence but patient is no longer somnolent so we will restart medicines in a stepwise fashion     Allergic Rhinitis: - Resume home meds as able   Low TSH - Check T4   During last hospitalization patient refused to go to skilled nursing  facility as recommended by PT but currently patient is agreeable to skilled nursing facility as long as is not [facility].  TOC consulted and patient has a qualifying stay from 2 weeks ago.  Patient medically stable to go       DVT prophylaxis: SCDs Start: 09/26/23 2031    Code Status: Full Code   Disposition Plan:  Level of care: Stepdown Status is: Inpatient     Consultants:  TOC  Subjective: Continues to be agreeable for rehab, does still have some hip discomfort  Objective: Vitals:   09/28/23 0900 09/28/23 0914 09/28/23 0947 09/28/23 1000  BP:   (!) 105/49 133/87  Pulse: 81 81 80 78  Resp: 17 18 19  (!) 25  Temp:      TempSrc:      SpO2: 97% 90% (!) 86% 95%  Weight:      Height:        Intake/Output Summary (Last 24 hours) at 09/28/2023 1146 Last data filed at 09/28/2023 0957 Gross per 24 hour  Intake 1460 ml  Output 200 ml  Net 1260 ml   Filed Weights   09/26/23 1030 09/26/23 2300 09/28/23 0600  Weight: (!) 184.6 kg (!) 182.7 kg (!) 184.5 kg    Examination:   General: Appearance:    Severely obese female in no acute distress-wearing BiPAP     Lungs:     respirations unlabored, diminished  Heart:    Normal heart rate.    MS:   All extremities are intact.    Neurologic:   Awake, alert, oriented x 3. No apparent focal neurological           defect.        Data Reviewed: I have personally reviewed following labs and imaging studies  CBC: Recent Labs  Lab 09/26/23 1117 09/27/23 0102  WBC 10.7* 10.5  NEUTROABS 7.2 6.7  HGB 11.0* 10.4*  HCT 37.3 36.3  MCV 98.7 99.5  PLT 277 273   Basic Metabolic Panel: Recent Labs  Lab 09/26/23 1117 09/27/23 0102  NA 142 139  K 4.9 4.7  CL 97* 94*  CO2 36* 36*  GLUCOSE 105* 116*  BUN 12 16  CREATININE 0.97 1.26*  CALCIUM 9.9 9.6  MG  --  2.2  PHOS  --  4.0   GFR: Estimated Creatinine Clearance: 77.7 mL/min (A) (by C-G formula based on SCr of 1.26 mg/dL (H)). Liver Function Tests: Recent  Labs  Lab 09/26/23 1117 09/27/23 0102  AST 12* 10*  ALT 15 15  ALKPHOS 82 79  BILITOT 0.7 0.6  PROT 6.9 6.7  ALBUMIN 3.5 3.3*   No results for input(s): LIPASE, AMYLASE in the last 168 hours. No results for input(s): AMMONIA in the last 168 hours. Coagulation Profile: No results for input(s): INR, PROTIME in the last 168 hours. Cardiac Enzymes: No results for input(s): CKTOTAL, CKMB, CKMBINDEX, TROPONINI in the last 168 hours. BNP (last 3 results) No results for input(s): PROBNP in the last 8760 hours. HbA1C: No results for input(s): HGBA1C in the last 72 hours. CBG: No results for input(s): GLUCAP in the last 168  hours. Lipid Profile: No results for input(s): CHOL, HDL, LDLCALC, TRIG, CHOLHDL, LDLDIRECT in the last 72 hours. Thyroid Function Tests: Recent Labs    09/27/23 0655  TSH 0.225*   Anemia Panel: No results for input(s): VITAMINB12, FOLATE, FERRITIN, TIBC, IRON, RETICCTPCT in the last 72 hours. Sepsis Labs: Recent Labs  Lab 09/27/23 0104  PROCALCITON <0.10    No results found for this or any previous visit (from the past 240 hours).       Radiology Studies: DG Chest Port 1 View Result Date: 09/26/2023 CLINICAL DATA:  Hypoxia EXAM: PORTABLE CHEST 1 VIEW COMPARISON:  09/12/2023, CT from 02/25/2023 FINDINGS: Cardiac shadow is enlarged but stable. Fullness in the hilar region on the right is noted which may be entirely related to vasculature although more prominent than that seen on the prior exam. No significant edema is noted. No focal confluent infiltrate is seen. No bony abnormality is noted. IMPRESSION: Fullness in the hilar regions which is likely vascular in nature when compared with prior CT from 02/25/2023 No acute abnormality noted. Electronically Signed   By: Oneil Devonshire M.D.   On: 09/26/2023 21:00   CT Hip Right Wo Contrast Result Date: 09/26/2023 CLINICAL DATA:  Right hip pain after fall. EXAM: CT OF  THE RIGHT HIP WITHOUT CONTRAST TECHNIQUE: Multidetector CT imaging of the right hip was performed according to the standard protocol. Multiplanar CT image reconstructions were also generated. RADIATION DOSE REDUCTION: This exam was performed according to the departmental dose-optimization program which includes automated exposure control, adjustment of the mA and/or kV according to patient size and/or use of iterative reconstruction technique. COMPARISON:  CT pelvis dated 09/10/2023. FINDINGS: Bones/Joint/Cartilage No acute fracture or dislocation. The right femoral head is seated within the acetabulum. The right sacroiliac joint and pubic symphysis are intact with mild degenerative changes. Mild osteoarthritis of the right hip. Degenerative changes of the visualized lower lumbar spine. Ligaments Ligaments are suboptimally evaluated by CT. Muscles and Tendons Muscles are within normal limits. No intramuscular fluid collection or hematoma. Soft tissue No fluid collection or hematoma.  Uterine fibroids again noted. IMPRESSION: 1. No acute osseous abnormality. 2. Mild osteoarthritis of the right hip. Electronically Signed   By: Harrietta Sherry M.D.   On: 09/26/2023 17:04   CT Head Wo Contrast Result Date: 09/26/2023 CLINICAL DATA:  Head trauma, intracranial arterial injury suspected EXAM: CT HEAD WITHOUT CONTRAST TECHNIQUE: Contiguous axial images were obtained from the base of the skull through the vertex without intravenous contrast. RADIATION DOSE REDUCTION: This exam was performed according to the departmental dose-optimization program which includes automated exposure control, adjustment of the mA and/or kV according to patient size and/or use of iterative reconstruction technique. COMPARISON:  CT head September 10, 2023 FINDINGS: Brain: No evidence of acute infarction, hemorrhage, hydrocephalus, extra-axial collection or mass lesion/mass effect. Patchy white matter hypodensities, compatible with chronic  microvascular disease. Similar scattered small intraparenchymal calcifications. Vascular: No hyperdense vessel identified. Calcific atherosclerosis. Skull: No acute fracture. Sinuses/Orbits: Clear sinuses.  No acute orbital findings. Other: No mastoid effusions. IMPRESSION: No evidence of acute intracranial abnormality. Electronically Signed   By: Gilmore GORMAN Molt M.D.   On: 09/26/2023 16:48   MR LUMBAR SPINE WO CONTRAST Result Date: 09/26/2023 CLINICAL DATA:  Myelopathy, acute, lumbar spine. Fall yesterday. Pain and numbness in the low back and legs. Right hip pain. EXAM: MRI LUMBAR SPINE WITHOUT CONTRAST TECHNIQUE: Multiplanar, multisequence MR imaging of the lumbar spine was performed. No intravenous contrast was administered. COMPARISON:  CT  lumbar spine 09/10/2023 FINDINGS: The study is motion degraded, including severe motion on the axial T2 sequence. Segmentation: 4 lumbar vertebrae with completely sacralized L5. Alignment:  Exaggerated lumbar lordosis.  No listhesis. Vertebrae: No fracture or suspicious marrow lesion. Mild left facet edema at L3-4, degenerative in appearance. Large hemangioma involving nearly the entirety of the L3 vertebral body. Small hemangioma at T11. Conus medullaris and cauda equina: Conus extends to the T12-L1 level. Conus and cauda equina appear normal. Paraspinal and other soft tissues: Unremarkable. Disc levels: T12-L1 and L1-2: Negative. L2-3: Disc desiccation and moderate disc space narrowing. Disc bulging and severe facet and ligamentum flavum hypertrophy result in mild-to-moderate spinal stenosis and mild bilateral neural foraminal stenosis. L3-4: Disc desiccation and mild disc space narrowing. Left eccentric disc bulging and severe left greater than right facet and ligamentum flavum hypertrophy result in mild-to-moderate spinal stenosis and mild-to-moderate left neural foraminal stenosis. L4-5: Disc desiccation and moderate disc space narrowing. Disc bulging and severe  facet and ligamentum flavum hypertrophy result in mild-to-moderate spinal stenosis and severe right and moderate to severe left neural foraminal stenosis. L5-S1: Transitional anatomy with rudimentary disc.  No stenosis. IMPRESSION: 1. Transitional lumbosacral anatomy. 2. No acute osseous abnormality. 3. Severe lower lumbar facet arthrosis. 4. 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 5. Severe neural foraminal stenosis at L4-5. Electronically Signed   By: Dasie Hamburg M.D.   On: 09/26/2023 14:07   MR Cervical Spine Wo Contrast Result Date: 09/26/2023 CLINICAL DATA:  Ataxia, cervical trauma. Fall yesterday. Pain and numbness in the low back and legs. EXAM: MRI CERVICAL SPINE WITHOUT CONTRAST TECHNIQUE: Multiplanar, multisequence MR imaging of the cervical spine was performed. No intravenous contrast was administered. COMPARISON:  CT cervical spine 02/07/2023 FINDINGS: The study is motion degraded, including severe motion on 2 attempted sagittal STIR sequences and on both axial sequences with the patient unable to tolerate any additional attempts at repeat imaging. Alignment: Cervical spine straightening.  No listhesis. Vertebrae: No fracture, suspicious marrow lesion, or significant marrow edema. Hemangiomas in the T1 and T3 vertebral bodies. Chronic degenerative endplate changes from C3-C7. Severe disc space narrowing at C4-5 and C5-6. Cord: Normal cord signal and morphology on the standard sagittal T2 sequence which is of good quality. Posterior Fossa, vertebral arteries, paraspinal tissues: Unremarkable included posterior fossa. Preserved vertebral artery flow voids. No prevertebral fluid or significant paraspinal soft tissue edema. Disc levels: Detailed characterization of degenerative changes is limited due to the degree of motion artifact on axial imaging. Mild spinal stenosis at C4-5 and C5-6 due to broad-based posterior disc osteophyte complexes without cord compression. Suspected moderate  multilevel neural foraminal stenosis which is poorly evaluated. IMPRESSION: 1. Severely motion degraded examination. No definite evidence of acute cervical spine injury. Normal appearance of the cervical spinal cord. 2. 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 Electronically Signed   By: Dasie Hamburg M.D.   On: 09/26/2023 13:56        Scheduled Meds:  Chlorhexidine  Gluconate Cloth  6 each Topical Daily   DULoxetine   60 mg Oral BID   enoxaparin  (LOVENOX ) injection  90 mg Subcutaneous Q24H   fluticasone   2 spray Each Nare Daily   fluticasone  furoate-vilanterol  1 puff Inhalation Daily   gabapentin   300 mg Oral QHS   ipratropium-albuterol   3 mL Nebulization TID   lidocaine   1 patch Transdermal Q24H   montelukast   10 mg Oral QHS   pantoprazole   40 mg Oral Daily  Continuous Infusions:   LOS: 2 days    Time spent: 45 minutes spent on chart review, discussion with nursing staff, consultants, updating family and interview/physical exam; more than 50% of that time was spent in counseling and/or coordination of care.    Harlene RAYMOND Bowl, DO [facility] Available via Epic secure chat 7am-7pm After these hours, please refer to coverage provider listed on [email or web address] 09/28/2023, 11:46 AM

## 2023-09-28 NOTE — TOC Progression Note (Signed)
 Transition of Care [facility]) - Progression Note    Patient Details  Name: Janice Ryan MRN: [id number] Date of Birth: 1955-12-23  Transition of [facility] CM/SW Contact  Jon ONEIDA Anon, RN Phone Number: 09/28/2023, 11:29 AM  Clinical Narrative:    [facility] acknowledges PT/OT recommendation for STR at SNF. Will start SNF workup as pt gets closer to medical readiness, pt is currently requiring 10-15L HFNC. TOC is following.   Expected Discharge Plan: Home w Home Health Services Barriers to Discharge: Continued Medical Work up  Expected Discharge Plan and Services In-house Referral: Clinical Social Work Discharge Planning Services: CM Consult Post Acute Care Choice: Durable Medical Equipment, Home Health Living arrangements for the past 2 months: Apartment                 DME Arranged: N/A DME Agency: [employer] Date DME Agency Contacted: 09/27/23 Time DME Agency Contacted: 1440 Representative spoke with at DME Agency: Mitch with [employer]             Social Determinants of Health (SDOH) Interventions SDOH Screenings   Food Insecurity: No Food Insecurity (09/27/2023)  Housing: Low Risk  (09/27/2023)  Transportation Needs: No Transportation Needs (09/27/2023)  Utilities: Not At Risk (09/27/2023)  Depression (PHQ2-9): Low Risk  (07/06/2021)  Social Connections: Socially Integrated (09/27/2023)  Tobacco Use: Low Risk  (09/27/2023)    Readmission Risk Interventions    09/27/2023    2:36 PM 03/06/2023   11:10 AM 02/08/2023    4:56 PM  Readmission Risk Prevention Plan  Post Dischage Appt   Complete  Medication Screening   Complete  Transportation Screening Complete Complete Complete  PCP or Specialist Appt within 5-7 Days Complete Complete   Home Care Screening Complete Complete   Medication Review (RN CM) Complete Complete

## 2023-09-29 ENCOUNTER — Inpatient Hospital Stay (HOSPITAL_COMMUNITY)

## 2023-09-29 DIAGNOSIS — J9622 Acute and chronic respiratory failure with hypercapnia: Secondary | ICD-10-CM | POA: Diagnosis not present

## 2023-09-29 LAB — CBC
HCT: 34.6 % — ABNORMAL LOW (ref 36.0–46.0)
Hemoglobin: 10.1 g/dL — ABNORMAL LOW (ref 12.0–15.0)
MCH: 28.8 pg (ref 26.0–34.0)
MCHC: 29.2 g/dL — ABNORMAL LOW (ref 30.0–36.0)
MCV: 98.6 fL (ref 80.0–100.0)
Platelets: 255 10*3/uL (ref 150–400)
RBC: 3.51 MIL/uL — ABNORMAL LOW (ref 3.87–5.11)
RDW: 12.3 % (ref 11.5–15.5)
WBC: 9.6 10*3/uL (ref 4.0–10.5)
nRBC: 0 % (ref 0.0–0.2)

## 2023-09-29 LAB — BASIC METABOLIC PANEL WITH GFR
Anion gap: 12 (ref 5–15)
Anion gap: 11 (ref 5–15)
BUN: 47 mg/dL — ABNORMAL HIGH (ref 8–23)
BUN: 54 mg/dL — ABNORMAL HIGH (ref 8–23)
CO2: 31 mmol/L (ref 22–32)
CO2: 29 mmol/L (ref 22–32)
Calcium: 9.2 mg/dL (ref 8.9–10.3)
Calcium: 8.8 mg/dL — ABNORMAL LOW (ref 8.9–10.3)
Chloride: 91 mmol/L — ABNORMAL LOW (ref 98–111)
Chloride: 90 mmol/L — ABNORMAL LOW (ref 98–111)
Creatinine, Ser: 3.44 mg/dL — ABNORMAL HIGH (ref 0.44–1.00)
Creatinine, Ser: 3.41 mg/dL — ABNORMAL HIGH (ref 0.44–1.00)
GFR, Estimated: 14 mL/min — ABNORMAL LOW (ref >60)
GFR, Estimated: 14 mL/min — ABNORMAL LOW (ref >60)
Glucose, Bld: 120 mg/dL — ABNORMAL HIGH (ref 70–99)
Glucose, Bld: 115 mg/dL — ABNORMAL HIGH (ref 70–99)
Potassium: 5 mmol/L (ref 3.5–5.1)
Potassium: 5 mmol/L (ref 3.5–5.1)
Sodium: 134 mmol/L — ABNORMAL LOW (ref 135–145)
Sodium: 130 mmol/L — ABNORMAL LOW (ref 135–145)

## 2023-09-29 LAB — T4, FREE: Free T4: 0.71 ng/dL (ref 0.61–1.12)

## 2023-09-29 MED ORDER — ENOXAPARIN SODIUM 30 MG/0.3ML IJ SOSY
30.0000 mg | PREFILLED_SYRINGE | INTRAMUSCULAR | Status: DC
  Administered 2023-09-29: 30 mg via SUBCUTANEOUS
  Filled 2023-09-29: qty 0.3

## 2023-09-29 MED ORDER — SODIUM CHLORIDE 0.9 % IV SOLN: INTRAVENOUS | Status: DC

## 2023-09-29 NOTE — Progress Notes (Signed)
   09/28/23 2342  BiPAP/CPAP/SIPAP  BiPAP/CPAP/SIPAP Pt Type Adult  BiPAP/CPAP/SIPAP SERVO (air)  Mask Type Full face mask  Dentures removed? Not applicable  Mask Size Medium  Set Rate 16 breaths/min  Respiratory Rate 18 breaths/min  IPAP 12 cmH20  EPAP 6 cmH2O  Pressure Support 6 cmH20  PEEP 6 cmH20  FiO2 (%) 30 %  Flow Rate 0 lpm  Minute Ventilation 12.2  Leak 12  Peak Inspiratory Pressure (PIP) 13  Tidal Volume (Vt) 621  Patient Home Machine No  Patient Home Mask No  Patient Home Tubing No  Auto Titrate No  Press High Alarm 30 cmH2O  Press Low Alarm 4 cmH2O  CPAP/SIPAP surface wiped down Yes  Device Plugged into RED Power Outlet Yes  Oxygen Percent 30 %  BiPAP/CPAP /SiPAP Vitals  Pulse Rate 79  Resp 16  SpO2 91 %  MEWS Score/Color  MEWS Score 0  MEWS Score Color Green

## 2023-09-29 NOTE — Progress Notes (Signed)
   09/29/23 2320  BiPAP/CPAP/SIPAP  BiPAP/CPAP/SIPAP Pt Type Adult  BiPAP/CPAP/SIPAP SERVO (air)  Mask Type Full face mask  Dentures removed? Not applicable  Mask Size Medium  Set Rate 16 breaths/min  Respiratory Rate 22 breaths/min  IPAP 12 cmH20  EPAP 6 cmH2O  Pressure Support 6 cmH20  PEEP 6 cmH20  FiO2 (%) 30 %  Flow Rate 0 lpm  Minute Ventilation 9.3  Leak 52  Peak Inspiratory Pressure (PIP) 16  Tidal Volume (Vt) 488  Patient Home Machine No  Patient Home Mask No  Patient Home Tubing No  Auto Titrate No  Device Plugged into RED Power Outlet Yes  Oxygen Percent 30 %  BiPAP/CPAP /SiPAP Vitals  Pulse Rate 71  Resp 15  SpO2 (!) 89 %  MEWS Score/Color  MEWS Score 0  MEWS Score Color Green

## 2023-09-29 NOTE — Progress Notes (Signed)
   09/29/23 2110  BiPAP/CPAP/SIPAP  $ Non-Invasive Ventilator  Non-Invasive Vent Subsequent  BiPAP/CPAP/SIPAP Pt Type Adult  BiPAP/CPAP/SIPAP SERVO  Mask Type Full face mask  Dentures removed? Not applicable  Mask Size Medium  Set Rate 16 breaths/min  Respiratory Rate 24 breaths/min  IPAP 12 cmH20  EPAP 6 cmH2O  Pressure Support 6 cmH20  PEEP 6 cmH20  FiO2 (%) 30 %  Flow Rate 0 lpm  Minute Ventilation 10.4  Leak 28  Peak Inspiratory Pressure (PIP) 17  Tidal Volume (Vt) 623  Patient Home Machine No  Patient Home Mask No  Patient Home Tubing No  Auto Titrate No  CPAP/SIPAP surface wiped down Yes  Device Plugged into RED Power Outlet Yes  Oxygen Percent 30 %  BiPAP/CPAP /SiPAP Vitals  Pulse Rate 71  Resp 13  SpO2 98 %  MEWS Score/Color  MEWS Score 1  MEWS Score Color Green

## 2023-09-29 NOTE — Progress Notes (Signed)
 PROGRESS NOTE    Janice Ryan  FMW:[id number] DOB: 1955-08-25 DOA: 09/26/2023 PCP: Lorren Greig PARAS, NP  Brief Narrative:  68 y.o. female with medical history significant for chronic hypoxic hypercapnic respiratory failure-wears 5 L at home, obstructive sleep apnea, moderate persistent asthma, essential hypertension, generalized anxiety disorder, allergic rhinitis, who is admitted to [facility] on 09/26/2023 with acute on chronic hypoxic hypercapnic respiratory failure after presenting from home to [facility] ED complaining of right hip pain.    The patient reports that she experienced a ground-level fall after tripping while attempting to ambulate at home.  She notes that this fall occurred on 09/25/2023, and resulted in a ground-level fall in which the right hip with a pencil point of contact with the floor below.  Subsequent to this fall, she notes right hip discomfort, sharp, with radiation into the right groin, worse with attempts to bear weight on the right lower extremity.  She also notes exacerbation of her chronic low back discomfort in the absence of any new acute focal weakness involving the lower extremities or any acute focal numbness, paresthesias.  Assessment & Plan:   Principal Problem:   Acute on chronic respiratory failure with hypercapnia (HCC) Active Problems:   Essential hypertension   Moderate persistent asthma   GAD (generalized anxiety disorder)   Morbid obesity (HCC)   Acute right hip pain   Low back pain   Acute encephalopathy   Allergic rhinitis   Acute on chronic hypoxic hypercapnic respiratory failure: Resolved.  In the setting of a documented history of chronic hypoxic respiratory failure on 5 L continuous nasal cannula as an outpatient-patient was noted to be hypoxic in the mid 60s on her baseline 5 L nasal cannula with concern for hypercapnia contributing to her ongoing somnolence.  She is not very compliant with use of BiPAP every night at home.  She is  now back to her baseline requirement of oxygen at 5 L saturating 93%.  Last VBG from 09/27/2023 7.5/56/136 Echocardiogram from 09/21/2023 with normal ejection fraction     AKI creatinine 3.44 from 1.26 on 09/27/2023 -Likely related to hypotension, will start her on normal saline and follow-up labs in a.m., bladder scan and renal ultrasound ordered.  Not on any nephrotoxins currently.  Did not get contrast with the CT scans.   Acute encephalopathy: Somnolence after doses of Dilaudid  and multiple doses of Ativan  in the ED suggestive of a toxic contribution from the central acting medications, leading to an additional hypoxic hypercapnic respiratory failure, as above.   - Resolved and patient appears to be at baseline      Acute right hip pain: Resulting from mechanical ground-level fall on 09/25/2023, with presenting CT of the right hip showed no evidence of acute osseous abnormality, including no evidence of acute fracture or dislocation.   -As needed oral medications --would avoid IV narcotics    Acute on chronic low back pain: Relative to her chronic low back acute exacerbation over the last 1 to 2 days after ground-level mechanical fall on 09/25/2023.  MRI of the lumbar spine shows no evidence of acute osseous abnormality showing multilevel mild to moderate spinal stenosis as well as severe neuroforaminal stenosis at L4-L5, potential symptomatic exacerbation of the latter in the setting of recent ground-level fall.  No evidence of overt red flag symptoms. - Lidoderm  patch ordered -As needed oral pain medicine, will not be using IV pain medicine     Morbid obesity: Complicating overall prognosis and outcome.  She  is very much interested in getting a gastric bypass however her surgeon at [facility] wants her to be losing more weight before they can attempt a gastric bypass.  She also reported that she follows up with a Technical sales engineer who would be willing to do a gastric bypass on her without losing weight.   She is looking into that. Estimated body mass index is 61.85 kg/m as calculated from the following:   Height as of this encounter: 5' 8 (1.727 m).   Weight as of this encounter: 184.5 kg.       Essential Hypertension:  - Hold lisinopril  for low blood pressure -Blood pressure still soft 105/52    Generalized anxiety disorder: Restarted Cymbalta  and BuSpar  once somnolence improved.   Allergic Rhinitis: - Resume home meds as able   Low TSH - Check T4 normal   Deconditioning -during last hospitalization patient refused to go to skilled nursing facility as recommended by PT but currently patient is agreeable to skilled nursing facility as long as is not [facility].  TOC consulted and patient has a qualifying stay from 2 weeks ago.  Patient medically stable to go    Estimated body mass index is 62.35 kg/m as calculated from the following:   Height as of this encounter: 5' 8 (1.727 m).   Weight as of this encounter: 186 kg.  DVT prophylaxis: Lovenox  Code Status: Full code  family Communication: discussed with patient Disposition Plan:  Status is: Inpatient Remains inpatient appropriate because: Acute illness AKI hypotension   Consultants:  None  Procedures: None  Antimicrobials: None  Subjective: Talking on the phone with family member reports feeling better than yesterday slept okay very anxious to have gastric bypass done feels her breathing is back to her baseline on 5 L of oxygen currently  Objective: Vitals:   09/29/23 0400 09/29/23 0435 09/29/23 0500 09/29/23 0600  BP: (!) 164/53  (!) 105/52   Pulse: 79  81   Resp: 16  13   Temp:  97.8 F (36.6 C)    TempSrc:  Oral    SpO2: 93%  93%   Weight:    (!) 186 kg  Height:        Intake/Output Summary (Last 24 hours) at 09/29/2023 0801 Last data filed at 09/29/2023 0600 Gross per 24 hour  Intake 480 ml  Output 650 ml  Net -170 ml   Filed Weights   09/26/23 2300 09/28/23 0600 09/29/23 0600  Weight: (!) 182.7  kg (!) 184.5 kg (!) 186 kg    Examination:  General exam: Appears in no active distress Respiratory system: Clear to auscultation. Respiratory effort normal. Cardiovascular system: Regular rate lower rate and rhythm Gastrointestinal system: Abdomen is nondistended, soft and nontender. No organomegaly or masses felt. Normal bowel sounds heard. Central nervous system: Alert and oriented.  Extremities: No edema    Data Reviewed: I have personally reviewed following labs and imaging studies  CBC: Recent Labs  Lab 09/26/23 1117 09/27/23 0102 09/29/23 0314  WBC 10.7* 10.5 9.6  NEUTROABS 7.2 6.7  --   HGB 11.0* 10.4* 10.1*  HCT 37.3 36.3 34.6*  MCV 98.7 99.5 98.6  PLT 277 273 255   Basic Metabolic Panel: Recent Labs  Lab 09/26/23 1117 09/27/23 0102 09/29/23 0314  NA 142 139 134*  K 4.9 4.7 5.0  CL 97* 94* 91*  CO2 36* 36* 31  GLUCOSE 105* 116* 120*  BUN 12 16 47*  CREATININE 0.97 1.26* 3.44*  CALCIUM 9.9  9.6 9.2  MG  --  2.2  --   PHOS  --  4.0  --    GFR: Estimated Creatinine Clearance: 28.6 mL/min (A) (by C-G formula based on SCr of 3.44 mg/dL (H)). Liver Function Tests: Recent Labs  Lab 09/26/23 1117 09/27/23 0102  AST 12* 10*  ALT 15 15  ALKPHOS 82 79  BILITOT 0.7 0.6  PROT 6.9 6.7  ALBUMIN 3.5 3.3*   No results for input(s): LIPASE, AMYLASE in the last 168 hours. No results for input(s): AMMONIA in the last 168 hours. Coagulation Profile: No results for input(s): INR, PROTIME in the last 168 hours. Cardiac Enzymes: No results for input(s): CKTOTAL, CKMB, CKMBINDEX, TROPONINI in the last 168 hours. BNP (last 3 results) No results for input(s): PROBNP in the last 8760 hours. HbA1C: No results for input(s): HGBA1C in the last 72 hours. CBG: No results for input(s): GLUCAP in the last 168 hours. Lipid Profile: No results for input(s): CHOL, HDL, LDLCALC, TRIG, CHOLHDL, LDLDIRECT in the last 72 hours. Thyroid  Function Tests: Recent Labs    09/27/23 0655 09/29/23 0314  TSH 0.225*  --   FREET4  --  0.71   Anemia Panel: No results for input(s): VITAMINB12, FOLATE, FERRITIN, TIBC, IRON, RETICCTPCT in the last 72 hours. Sepsis Labs: Recent Labs  Lab 09/27/23 0104  PROCALCITON <0.10    No results found for this or any previous visit (from the past 240 hours).    Radiology Studies: No results found.   Scheduled Meds:  Chlorhexidine  Gluconate Cloth  6 each Topical Daily   DULoxetine   60 mg Oral BID   enoxaparin  (LOVENOX ) injection  90 mg Subcutaneous Q24H   fluticasone   2 spray Each Nare Daily   fluticasone  furoate-vilanterol  1 puff Inhalation Daily   gabapentin   300 mg Oral QHS   ipratropium-albuterol   3 mL Nebulization TID   lidocaine   1 patch Transdermal Q24H   montelukast   10 mg Oral QHS   pantoprazole   40 mg Oral Daily   Continuous Infusions:   LOS: 3 days    Time spent: 39 min  Almarie KANDICE Hoots, MD 09/29/2023, 8:01 AM

## 2023-09-29 NOTE — Plan of Care (Signed)

## 2023-09-30 ENCOUNTER — Inpatient Hospital Stay (HOSPITAL_COMMUNITY)

## 2023-09-30 DIAGNOSIS — J9622 Acute and chronic respiratory failure with hypercapnia: Secondary | ICD-10-CM | POA: Diagnosis not present

## 2023-09-30 LAB — BLOOD GAS, ARTERIAL
Acid-Base Excess: 5.5 mmol/L — ABNORMAL HIGH (ref 0.0–2.0)
Acid-Base Excess: 5.6 mmol/L — ABNORMAL HIGH (ref 0.0–2.0)
Bicarbonate: 34 mmol/L — ABNORMAL HIGH (ref 20.0–28.0)
Bicarbonate: 33.7 mmol/L — ABNORMAL HIGH (ref 20.0–28.0)
Delivery systems: POSITIVE
Delivery systems: POSITIVE
Drawn by: 25770
Drawn by: 25770
FIO2: 30 %
FIO2: 40 %
O2 Saturation: 98.2 %
O2 Saturation: 98 %
PEEP: 6 cmH2O
PEEP: 6 cmH2O
Patient temperature: 36.4
Patient temperature: 36.4
Pressure control: 6 cmH2O
Pressure control: 16 cmH2O
RATE: 16 {breaths}/min
RATE: 16 {breaths}/min
pCO2 arterial: 72 mmHg (ref 32–48)
pCO2 arterial: 68 mmHg (ref 32–48)
pH, Arterial: 7.28 — ABNORMAL LOW (ref 7.35–7.45)
pH, Arterial: 7.3 — ABNORMAL LOW (ref 7.35–7.45)
pO2, Arterial: 86 mmHg (ref 83–108)
pO2, Arterial: 80 mmHg — ABNORMAL LOW (ref 83–108)

## 2023-09-30 LAB — URINALYSIS, ROUTINE W REFLEX MICROSCOPIC
Bilirubin Urine: NEGATIVE
Glucose, UA: NEGATIVE mg/dL
Ketones, ur: NEGATIVE mg/dL
Nitrite: NEGATIVE
Protein, ur: NEGATIVE mg/dL
Specific Gravity, Urine: 1.012 (ref 1.005–1.030)
pH: 5 (ref 5.0–8.0)

## 2023-09-30 LAB — COMPREHENSIVE METABOLIC PANEL WITH GFR
ALT: 12 U/L (ref 0–44)
AST: 9 U/L — ABNORMAL LOW (ref 15–41)
Albumin: 3.2 g/dL — ABNORMAL LOW (ref 3.5–5.0)
Alkaline Phosphatase: 79 U/L (ref 38–126)
Anion gap: 9 (ref 5–15)
BUN: 58 mg/dL — ABNORMAL HIGH (ref 8–23)
CO2: 30 mmol/L (ref 22–32)
Calcium: 8.7 mg/dL — ABNORMAL LOW (ref 8.9–10.3)
Chloride: 89 mmol/L — ABNORMAL LOW (ref 98–111)
Creatinine, Ser: 4.1 mg/dL — ABNORMAL HIGH (ref 0.44–1.00)
GFR, Estimated: 11 mL/min — ABNORMAL LOW (ref >60)
Glucose, Bld: 105 mg/dL — ABNORMAL HIGH (ref 70–99)
Potassium: 5.8 mmol/L — ABNORMAL HIGH (ref 3.5–5.1)
Sodium: 128 mmol/L — ABNORMAL LOW (ref 135–145)
Total Bilirubin: 0.7 mg/dL (ref 0.0–1.2)
Total Protein: 6.8 g/dL (ref 6.5–8.1)

## 2023-09-30 LAB — SODIUM, URINE, RANDOM: Sodium, Ur: <10 mmol/L

## 2023-09-30 LAB — CREATININE, URINE, RANDOM: Creatinine, Urine: 153 mg/dL

## 2023-09-30 MED ORDER — FUROSEMIDE 10 MG/ML IJ SOLN
80.0000 mg | Freq: Two times a day (BID) | INTRAMUSCULAR | Status: DC
  Administered 2023-09-30: 80 mg via INTRAVENOUS
  Filled 2023-09-30: qty 8

## 2023-09-30 MED ORDER — HEPARIN SODIUM (PORCINE) 5000 UNIT/ML IJ SOLN
5000.0000 [IU] | Freq: Three times a day (TID) | INTRAMUSCULAR | Status: DC
  Administered 2023-09-30 – 2023-10-09 (×26): 5000 [IU] via SUBCUTANEOUS
  Filled 2023-09-30 (×25): qty 1

## 2023-09-30 MED ORDER — ENSURE PLUS HIGH PROTEIN PO LIQD
237.0000 mL | Freq: Two times a day (BID) | ORAL | Status: DC
  Administered 2023-10-02 – 2023-10-05 (×5): 237 mL via ORAL

## 2023-09-30 MED ORDER — SODIUM ZIRCONIUM CYCLOSILICATE 10 G PO PACK
10.0000 g | PACK | Freq: Once | ORAL | Status: AC
  Administered 2023-09-30: 10 g via ORAL
  Filled 2023-09-30: qty 1

## 2023-09-30 MED ORDER — SODIUM CHLORIDE 0.9 % IV BOLUS
250.0000 mL | Freq: Once | INTRAVENOUS | Status: AC
  Administered 2023-09-30: 250 mL via INTRAVENOUS

## 2023-09-30 MED ORDER — FUROSEMIDE 10 MG/ML IJ SOLN
120.0000 mg | Freq: Two times a day (BID) | INTRAVENOUS | Status: DC
  Administered 2023-10-01: 120 mg via INTRAVENOUS
  Filled 2023-09-30: qty 2
  Filled 2023-09-30 (×2): qty 12

## 2023-09-30 NOTE — Progress Notes (Signed)
 BiPap on @ approximatley 1020. Patient reports that she is feeling more SHOB, more tired today, states that she slept well last. Patient also having uncontrollable jerking motions pericolically.  Dr. Trixie informed.

## 2023-09-30 NOTE — Progress Notes (Signed)
Notified Lab that ABG being sent for analysis. 

## 2023-09-30 NOTE — Progress Notes (Addendum)
 PROGRESS NOTE  Janice Ryan FMW:[id number] DOB: [birth year] DOA: 09/26/2023 PCP: Lorren Greig PARAS, NP   LOS: 4 days   Brief Narrative / Interim history: 68 y.o. female with medical history significant for chronic hypoxic hypercapnic respiratory failure-wears 5 L at home, obstructive sleep apnea, moderate persistent asthma, essential hypertension, generalized anxiety disorder, allergic rhinitis, who is admitted to [facility] on 09/26/2023 with acute on chronic hypoxic hypercapnic respiratory failure after presenting from home to [facility] ED complaining of right hip pain.  This happened after tripping and falling at home on 6/24.  Following the fall she had right hip discomfort radiating into the right groin every time she attempted to bear weight.  Subjective / 24h Interval events: She is doing well this morning, states that her breathing is improved.  Assesement and Plan: Principal Problem:   Acute on chronic respiratory failure with hypercapnia (HCC) Active Problems:   Essential hypertension   Moderate persistent asthma   GAD (generalized anxiety disorder)   Morbid obesity (HCC)   Acute right hip pain   Low back pain   Acute encephalopathy   Allergic rhinitis  Principal problem Acute on chronic hypoxic and hypercarbic respiratory failure-improved, respiratory status appears back to baseline and she is on 5 L this morning.  She is supposed to be on BiPAP nightly, but not very compliant.  Echocardiogram 6/20 showed normal LVEF - Appears back to baseline, continue supportive care  Active problems Acute kidney injury -patient's creatinine on presentation was 1.2.  She has some intermittent low blood pressure reads but she is normotensive now for most part.  Yesterday her creatinine was 3.4, was started on IV fluids, but creatinine is getting worse today at 4.1.  Renal ultrasound unremarkable, she is not retaining.  Urine output only 400 cc overnight - Nephrology consulted,  discussed with Dr. Geralynn, appreciate input  Hyperkalemia-due to AKI, Lokelma  x 1 today  Acute toxic/metabolic encephalopathy-became somnolent after multiple doses of Dilaudid  and Ativan  in the ED.  Now resolved  Right hip pain-imaging with CT did not show any evidence of fractures.  Pain control, physical therapy  Chronic low back pain -slightly worse now after the fall.  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  Continue supportive care  Obesity, morbid-BMI 63.  She would benefit from weight loss  Scheduled Meds:  Chlorhexidine  Gluconate Cloth  6 each Topical Daily   enoxaparin  (LOVENOX ) injection  30 mg Subcutaneous Q24H   feeding supplement  237 mL Oral BID BM   fluticasone   2 spray Each Nare Daily   fluticasone  furoate-vilanterol  1 puff Inhalation Daily   gabapentin   300 mg Oral QHS   ipratropium-albuterol   3 mL Nebulization TID   lidocaine   1 patch Transdermal Q24H   montelukast   10 mg Oral QHS   pantoprazole   40 mg Oral Daily   Continuous Infusions:  sodium chloride  100 mL/hr at 09/30/23 0811   PRN Meds:.acetaminophen  **OR** acetaminophen , albuterol , melatonin, naLOXone  (NARCAN )  injection, ondansetron  (ZOFRAN ) IV, mouth rinse, oxyCODONE   Current Outpatient Medications  Medication Instructions   Acetaminophen  Extra Strength 1,000 mg, Oral, Every 6 hours PRN   albuterol  (VENTOLIN  HFA) 108 (90 Base) MCG/ACT inhaler 2 puffs, Inhalation, Every 6 hours PRN   amoxicillin -clavulanate (AUGMENTIN ) 875-125 MG tablet 1 tablet, Oral, 2 times daily   amphetamine-dextroamphetamine (ADDERALL XR) 20 MG 24 hr capsule 20 mg, Oral, Daily   busPIRone  (BUSPAR ) 15 mg, Oral,  3 times daily   cetirizine  (ZYRTEC  ALLERGY) 10 mg, Oral, Daily   cholecalciferol  (VITAMIN D3) 1,000 Units, Oral, Daily   doxepin  (SINEQUAN ) 50 mg, Oral, Every morning   DULoxetine  (CYMBALTA ) 120 mg, Oral, Daily   fluticasone   (FLONASE ) 50 MCG/ACT nasal spray 2 sprays, Each Nare, Daily   fluticasone -salmeterol (ADVAIR) 500-50 MCG/ACT AEPB 1 puff, Inhalation, 2 times daily   gabapentin  (NEURONTIN ) 300 mg, Oral, Daily at bedtime   lisinopril  (ZESTRIL ) 20 mg, Oral, Daily   meloxicam  (MOBIC ) 15 mg, Oral, 2 times daily   mirtazapine  (REMERON ) 15 mg, Oral, Daily at bedtime   montelukast  (SINGULAIR ) 10 mg, Oral, Daily at bedtime   Multiple Vitamin (MULTIVITAMIN WITH MINERALS) TABS tablet 1 tablet, Oral, Daily   Omega-3 Fatty Acids (OMEGA-3 PO) 1 capsule, Oral, Daily   oxyCODONE  (OXY IR/ROXICODONE ) 5 mg, Oral, Every 6 hours PRN   oxyCODONE -acetaminophen  (PERCOCET) 5-325 MG tablet Take 1 every 6 hours for pain not relieved by Tylenol  or Motrin  along   OXYGEN 5 L, Inhalation, Continuous   pantoprazole  (PROTONIX ) 40 mg, Oral, Daily   predniSONE  (DELTASONE ) 20 mg, Oral, Daily   traZODone  (DESYREL ) 50 mg, Oral, At bedtime PRN    Diet Orders (From admission, onward)     Start     Ordered   09/27/23 0810  Diet Heart Fluid consistency: Thin  Diet effective now       Question:  Fluid consistency:  Answer:  Thin   09/27/23 0809            DVT prophylaxis: enoxaparin  (LOVENOX ) injection 30 mg Start: 09/29/23 2200 SCDs Start: 09/26/23 2031   Lab Results  Component Value Date   PLT 255 09/29/2023      Code Status: Full Code  Family Communication: No family at bedside  Status is: Inpatient Remains inpatient appropriate because: Severity of illness   Level of care: Stepdown  Consultants:  Nephrology  Objective: Vitals:   09/30/23 0700 09/30/23 0730 09/30/23 0800 09/30/23 0830  BP: (!) 130/54  (!) 117/46   Pulse: 72 76 72 71  Resp: 16 18 12 15   Temp:   97.7 F (36.5 C)   TempSrc:   Oral   SpO2: 90% 91% 91% 92%  Weight:      Height:        Intake/Output Summary (Last 24 hours) at 09/30/2023 1009 Last data filed at 09/30/2023 9188 Gross per 24 hour  Intake 1395.21 ml  Output 400 ml  Net 995.21 ml    Wt Readings from Last 3 Encounters:  09/30/23 (!) 190.1 kg  09/11/23 (!) 183.7 kg  06/25/23 (!) 167.8 kg    Examination:  Constitutional: NAD Eyes: no scleral icterus ENMT: Mucous membranes are moist.  Neck: normal, supple Respiratory: clear to auscultation bilaterally, no wheezing, no crackles. Normal respiratory effort. Cardiovascular: Regular rate and rhythm, no murmurs / rubs / gallops. No LE edema.  Abdomen: non distended, no tenderness. Bowel sounds positive.  Musculoskeletal: no clubbing / cyanosis.   Data Reviewed: I have independently reviewed following labs and imaging studies   CBC Recent Labs  Lab 09/26/23 1117 09/27/23 0102 09/29/23 0314  WBC 10.7* 10.5 9.6  HGB 11.0* 10.4* 10.1*  HCT 37.3 36.3 34.6*  PLT 277 273 255  MCV 98.7 99.5 98.6  MCH 29.1 28.5 28.8  MCHC 29.5* 28.7* 29.2*  RDW 11.9 12.1 12.3  LYMPHSABS 2.5 2.5  --   MONOABS 0.8 1.1*  --   EOSABS 0.1 0.2  --  BASOSABS 0.0 0.0  --     Recent Labs  Lab 09/26/23 1117 09/26/23 1948 09/27/23 0102 09/27/23 0104 09/27/23 0655 09/29/23 0314 09/29/23 1828 09/30/23 0315  NA 142  --  139  --   --  134* 130* 128*  K 4.9  --  4.7  --   --  5.0 5.0 5.8*  CL 97*  --  94*  --   --  91* 90* 89*  CO2 36*  --  36*  --   --  31 29 30   GLUCOSE 105*  --  116*  --   --  120* 115* 105*  BUN 12  --  16  --   --  47* 54* 58*  CREATININE 0.97  --  1.26*  --   --  3.44* 3.41* 4.10*  CALCIUM 9.9  --  9.6  --   --  9.2 8.8* 8.7*  AST 12*  --  10*  --   --   --   --  9*  ALT 15  --  15  --   --   --   --  12  ALKPHOS 82  --  79  --   --   --   --  79  BILITOT 0.7  --  0.6  --   --   --   --  0.7  ALBUMIN 3.5  --  3.3*  --   --   --   --  3.2*  MG  --   --  2.2  --   --   --   --   --   PROCALCITON  --   --   --  <0.10  --   --   --   --   TSH  --   --   --   --  0.225*  --   --   --   BNP  --  101.1*  --   --   --   --   --   --      ------------------------------------------------------------------------------------------------------------------ No results for input(s): CHOL, HDL, LDLCALC, TRIG, CHOLHDL, LDLDIRECT in the last 72 hours.  Lab Results  Component Value Date   HGBA1C 5.1 06/25/2023   ------------------------------------------------------------------------------------------------------------------ No results for input(s): TSH, T4TOTAL, T3FREE, THYROIDAB in the last 72 hours.  Invalid input(s): FREET3  Cardiac Enzymes No results for input(s): CKMB, TROPONINI, MYOGLOBIN in the last 168 hours.  Invalid input(s): CK ------------------------------------------------------------------------------------------------------------------    Component Value Date/Time   BNP 101.1 (H) 09/26/2023 1948    CBG: No results for input(s): GLUCAP in the last 168 hours.  No results found for this or any previous visit (from the past 240 hours).   Radiology Studies: No results found.   Nilda Fendt, MD, PhD [facility]  Between 7 am - 7 pm I am available, please contact me via Amion (for emergencies) or Securechat (non urgent messages)  Between 7 pm - 7 am I am not available, please contact night coverage MD/APP via Amion

## 2023-09-30 NOTE — Progress Notes (Signed)
   09/30/23 0412  BiPAP/CPAP/SIPAP  $ Non-Invasive Ventilator  Non-Invasive Vent Subsequent  BiPAP/CPAP/SIPAP Pt Type Adult  BiPAP/CPAP/SIPAP SERVO (air)  Mask Type Full face mask  Dentures removed? Not applicable  Mask Size Medium  Set Rate 16 breaths/min  Respiratory Rate 25 breaths/min  IPAP 12 cmH20  EPAP 6 cmH2O  Pressure Support 6 cmH20  PEEP 6 cmH20  FiO2 (%) 30 %  Flow Rate 0 lpm  Minute Ventilation 8.3  Leak 26  Peak Inspiratory Pressure (PIP) 17  Tidal Volume (Vt) 588  Patient Home Machine No  Patient Home Mask No  Patient Home Tubing No  Press High Alarm 25 cmH2O  Device Plugged into RED Power Outlet Yes  Oxygen Percent 30 %  BiPAP/CPAP /SiPAP Vitals  Pulse Rate 78  Resp 14  SpO2 94 %  MEWS Score/Color  MEWS Score 0  MEWS Score Color Green

## 2023-09-30 NOTE — Consult Note (Addendum)
 Renal Service Consult Note [facility]  Janice Ryan 09/30/2023 Janice JONETTA Fret, MD Requesting Physician: Dr. Trixie  Reason for Consult: Renal failure HPI: The patient is a 68 y.o. year-old w/ PMH as below who presented to ED after a fall at home on 09/26/2023.  She also had right hip pain.  Patient has a history of chronic hypoxic respiratory failure on 5 L nasal cannula at home also with hypercapnia, OSA.  Is on BiPAP at home overnight.  History of asthma also with anxiety, hypertension.  In the ED heart rate 80s, blood pressure 130/80, respiratory rate 20, 93% sat on 5 L nasal cannula.  Patient received IV pain meds and Ativan  with decline in her oxygen saturation and subsequently required BiPAP support.  Creatinine was 0.8, bicarb 36 WBC 11.7, UA negative.  Chest x-ray showed no acute process.  MRI of the spine was negative for fracture she did have multilevel mild to moderate spinal stenosis.  CT right hip was negative.  Patient was confused and admitted to the hospital.  Next day creatinine was up to 1.2 then 3.4 on 6/28, and 4.1 today. We are asked to see for renal failure    Pt seen in ICU. Pt is lethargic, no hx obtained. She was on bipap for 24 hrs then HFNC, then [place] yesterday and now is back on bipap.  UOP was 150 - 650 cc /day here Renal US  showed 9.2 cm / 9.2 cm kidneys w/o hydro She had 2 CT scan and 2 MRI's on 6/25, no IV contrast on any of then IP meds of interest:  PPI 6/26 - current, po Gabapentin  300 hs started 6/26 No acei, arb, no bp lowering meds, no contrast Admit labs on 6/25 --> Na 139  K+ 4.7  CO2 36  BUN 16  creat 1.2 Ca 9.6, alb 3.3, WBC 10K, Hb 10.4  ROS - n/a   Past Medical History  Past Medical History:  Diagnosis Date   Acute respiratory failure with hypoxia (HCC)    Asthma    Chronic hypercapnic respiratory failure (HCC)    Hypertension    Morbid obesity (HCC)    Obesity with alveolar hypoventilation and body mass index  (BMI) of 40 or greater (HCC)    OSA (obstructive sleep apnea)    Past Surgical History  Past Surgical History:  Procedure Laterality Date   HERNIA REPAIR     JOINT REPLACEMENT Left    knee   Family History  Family History  Problem Relation Age of Onset   Atrial fibrillation Mother    Congestive Heart Failure Father    Social History  reports that she has never smoked. She has never used smokeless tobacco. She reports that she does not drink alcohol  and does not use drugs. Allergies  Allergies  Allergen Reactions   Sulfa Antibiotics Nausea And Vomiting and Other (See Comments)    Stomach pain   Home medications Prior to Admission medications   Medication Sig Start Date End Date Taking? Authorizing Provider  acetaminophen  (TYLENOL ) 500 MG tablet Take 2 tablets (1,000 mg total) by mouth every 6 (six) hours as needed for mild pain (pain score 1-3). 09/13/23  Yes Will Almarie MATSU, MD  albuterol  (VENTOLIN  HFA) 108 [phone] Base) MCG/ACT inhaler Inhale 2 puffs into the lungs every 6 (six) hours as needed for wheezing or shortness of breath. 09/13/23  Yes Will Almarie MATSU, MD  amphetamine-dextroamphetamine (ADDERALL XR) 20 MG 24 hr capsule Take 20 mg by mouth  daily.   Yes [provider]  busPIRone  (BUSPAR ) 15 MG tablet Take 15 mg by mouth 3 (three) times daily.   Yes [provider]  cetirizine  (ZYRTEC  ALLERGY) 10 MG tablet Take 1 tablet (10 mg total) by mouth daily. 01/03/23  Yes Lorren, [name] J, NP  cholecalciferol  (VITAMIN D3) 25 MCG (1000 UNIT) tablet Take 1,000 Units by mouth daily.   Yes [provider]  doxepin  (SINEQUAN ) 50 MG capsule Take 1 capsule (50 mg total) by mouth in the morning. 07/26/23  Yes Upstill, Margit, PA-C  DULoxetine  (CYMBALTA ) 60 MG capsule Take 2 capsules (120 mg total) by mouth daily. 02/15/23  Yes Regalado, Belkys A, MD  fluticasone  (FLONASE ) 50 MCG/ACT nasal spray Place 2 sprays into both nostrils daily. 02/20/22  Yes Pray, Rollene BRAVO, MD  fluticasone -salmeterol (ADVAIR) 500-50 MCG/ACT AEPB Inhale 1 puff into the lungs in the morning and at bedtime. 08/01/22  Yes Mayers, Cari S, PA-C  gabapentin  (NEURONTIN ) 300 MG capsule Take 1 capsule (300 mg total) by mouth at bedtime. 08/20/23 11/18/23 Yes Lorren, [name] J, NP  lisinopril  (ZESTRIL ) 20 MG tablet Take 1 tablet (20 mg total) by mouth daily. 09/13/23  Yes Will Almarie MATSU, MD  meloxicam  (MOBIC ) 15 MG tablet Take 15 mg by mouth in the morning and at bedtime.   Yes [provider]  mirtazapine  (REMERON ) 15 MG tablet Take 1 tablet (15 mg total) by mouth at bedtime. 07/26/23  Yes Odell Margit, PA-C  Multiple Vitamin (MULTIVITAMIN WITH MINERALS) TABS tablet Take 1 tablet by mouth daily.   Yes [provider]  Omega-3 Fatty Acids (OMEGA-3 PO) Take 1 capsule by mouth daily.   Yes [provider]  oxyCODONE  (OXY IR/ROXICODONE ) 5 MG immediate release tablet Take 1 tablet (5 mg total) by mouth every 6 (six) hours as needed for severe pain (pain score 7-10). 09/13/23  Yes Will Almarie MATSU, MD  OXYGEN Inhale 5 L into the lungs continuous.   Yes [provider]  pantoprazole  (PROTONIX ) 40 MG tablet Take 1 tablet (40 mg total) by mouth daily. 03/07/23  Yes Elgergawy, Brayton RAMAN, MD  predniSONE  (DELTASONE ) 10 MG tablet Take 2 tablets (20 mg total) by mouth daily. 09/26/23  Yes Suzette Pac, MD  traZODone  (DESYREL ) 50 MG tablet Take 50 mg by mouth at bedtime as needed for sleep.   Yes [provider]  amoxicillin -clavulanate (AUGMENTIN ) 875-125 MG tablet Take 1 tablet by mouth 2 (two) times daily. Patient not taking: Reported on 09/28/2023 09/13/23   Will Almarie MATSU, MD  montelukast  (SINGULAIR ) 10 MG tablet Take 1 tablet (10 mg total) by mouth at bedtime. Patient not taking: Reported on 09/28/2023 01/16/23   Lorren Greig PARAS, NP  oxyCODONE -acetaminophen  (PERCOCET) 5-325 MG tablet Take 1 every 6 hours for pain not relieved by Tylenol  or Motrin   along 09/26/23   Suzette Pac, MD     Vitals:   09/30/23 1000 09/30/23 1152 09/30/23 1222 09/30/23 1258  BP:      Pulse: 69  72   Resp: 18  20 17   Temp:  98.9 F (37.2 C)    TempSrc:  Axillary    SpO2: 92%  94% 94%  Weight:      Height:       Exam Gen on bipap, lethargic, morbidly obese No rash, cyanosis or gangrene Sclera anicteric, throat clear  No jvd or bruits Chest clear bilat to bases, no rales/ wheezing RRR no MRG Abd soft ntnd no mass or ascites +  bs GU no foley yet MS no joint effusions or deformity Ext no LE or UE edema, no other edema Neuro is lethargic, no jerking of the extremities   CXR 6/25 -IMPRESSION:Fullness in the hilar regions which is likely vascular in naturewhen compared with prior CT from 02/25/2023. No acute abnormality noted. CXR 6/29 - IMPRESSION: Moderate cardiomegaly. Increased bilateral perihilar interstitial opacities, worrisome for interstitial edema.  UA 6/25 - neg prot, 0-5 wbc/ rbc/ epi UNa, UCr pending   Assessment/ Plan: AKI, severe: b/l creat on admission was 0.8, eGFR > 60 ml/min, on 09/26/23. She was admitted for hip pain after a fall. She had respiratory decompensation (+hx chronic hypoxic/ hypercarbic resp failure) then and the 2nd and 3rd day creat started to climb and creat today is up to 4.10. UA is negative, UNa/ UCr are pending. Renal US  showed no obstruction. Unclear cause of AKI, ATN vs AIN vs retention vs other. Needs a foley catheter. Echo here EF is 50-55%. Resp status has worsened and pt was put back on bipap and sent to ICU today. IV lasix  was started for pulm edema on CXR 6/29. Will follow.  Hyperkalemia: change to renal diet, give lokelma  10gm tid until corrected.  Acute on chronic hypoxic/ hypercapneic resp failure: started IV lasix , have ^'d to 120mg  bid due to her size and AKI.   Metabolic alkalosis: due to chronic hypercarbia/ CO2 retention which cause resp acidosis buffered by kidneys reclaiming more bicarb. Morbid  obesity       Myer Fret  MD [facility] 09/30/2023, 2:12 PM  Recent Labs  Lab 09/27/23 0102 09/29/23 0314 09/29/23 1828 09/30/23 0315  HGB 10.4* 10.1*  --   --   ALBUMIN 3.3*  --   --  3.2*  CALCIUM 9.6 9.2 8.8* 8.7*  PHOS 4.0  --   --   --   CREATININE 1.26* 3.44* 3.41* 4.10*  K 4.7 5.0 5.0 5.8*   Inpatient medications:  Chlorhexidine  Gluconate Cloth  6 each Topical Daily   feeding supplement  237 mL Oral BID BM   fluticasone   2 spray Each Nare Daily   fluticasone  furoate-vilanterol  1 puff Inhalation Daily   gabapentin   300 mg Oral QHS   heparin injection (subcutaneous)  5,000 Units Subcutaneous Q8H   ipratropium-albuterol   3 mL Nebulization TID   lidocaine   1 patch Transdermal Q24H   montelukast   10 mg Oral QHS   pantoprazole   40 mg Oral Daily    furosemide      acetaminophen  **OR** acetaminophen , albuterol , melatonin, naLOXone  (NARCAN )  injection, ondansetron  (ZOFRAN ) IV, mouth rinse, oxyCODONE 

## 2023-09-30 NOTE — Plan of Care (Signed)
   Problem: Education: Goal: Knowledge of General Education information will improve Description: Including pain rating scale, medication(s)/side effects and non-pharmacologic comfort measures Outcome: Progressing   Problem: Clinical Measurements: Goal: Ability to maintain clinical measurements within normal limits will improve Outcome: Progressing

## 2023-09-30 NOTE — Progress Notes (Signed)
 Removed PT from BiPAP at approximately 0812. PT is not in respiratory distress at this time- alert and orientated to place, time, self. RN aware.

## 2023-09-30 NOTE — Progress Notes (Signed)
   09/30/23 2247  BiPAP/CPAP/SIPAP  BiPAP/CPAP/SIPAP Pt Type Adult  BiPAP/CPAP/SIPAP SERVO  Mask Type Full face mask  Dentures removed? Not applicable  Mask Size Medium  Set Rate 20 breaths/min  Respiratory Rate 22 breaths/min  IPAP 16 cmH20  EPAP 6 cmH2O  PEEP 6 cmH20  FiO2 (%) 40 %  Minute Ventilation 14.9  Leak 13  Peak Inspiratory Pressure (PIP) 22  Tidal Volume (Vt) 683  Patient Home Machine No  Patient Home Mask No  Patient Home Tubing No  Auto Titrate No  Press High Alarm 30 cmH2O  Press Low Alarm 4 cmH2O  Device Plugged into RED Power Outlet Yes  Oxygen Percent 40 %  BiPAP/CPAP /SiPAP Vitals  Pulse Rate 76  Resp 20  SpO2 94 %  MEWS Score/Color  MEWS Score 0  MEWS Score Color Green

## 2023-10-01 DIAGNOSIS — J9622 Acute and chronic respiratory failure with hypercapnia: Secondary | ICD-10-CM | POA: Diagnosis not present

## 2023-10-01 LAB — CBC
HCT: 29.6 % — ABNORMAL LOW (ref 36.0–46.0)
Hemoglobin: 8.8 g/dL — ABNORMAL LOW (ref 12.0–15.0)
MCH: 28.5 pg (ref 26.0–34.0)
MCHC: 29.7 g/dL — ABNORMAL LOW (ref 30.0–36.0)
MCV: 95.8 fL (ref 80.0–100.0)
Platelets: 223 10*3/uL (ref 150–400)
RBC: 3.09 MIL/uL — ABNORMAL LOW (ref 3.87–5.11)
RDW: 12.4 % (ref 11.5–15.5)
WBC: 9.3 10*3/uL (ref 4.0–10.5)
nRBC: 0 % (ref 0.0–0.2)

## 2023-10-01 LAB — COMPREHENSIVE METABOLIC PANEL WITH GFR
ALT: 10 U/L (ref 0–44)
AST: 10 U/L — ABNORMAL LOW (ref 15–41)
Albumin: 3.1 g/dL — ABNORMAL LOW (ref 3.5–5.0)
Alkaline Phosphatase: 81 U/L (ref 38–126)
Anion gap: 12 (ref 5–15)
BUN: 72 mg/dL — ABNORMAL HIGH (ref 8–23)
CO2: 25 mmol/L (ref 22–32)
Calcium: 8.3 mg/dL — ABNORMAL LOW (ref 8.9–10.3)
Chloride: 91 mmol/L — ABNORMAL LOW (ref 98–111)
Creatinine, Ser: 4.2 mg/dL — ABNORMAL HIGH (ref 0.44–1.00)
GFR, Estimated: 11 mL/min — ABNORMAL LOW (ref >60)
Glucose, Bld: 109 mg/dL — ABNORMAL HIGH (ref 70–99)
Potassium: 5.5 mmol/L — ABNORMAL HIGH (ref 3.5–5.1)
Sodium: 128 mmol/L — ABNORMAL LOW (ref 135–145)
Total Bilirubin: 0.6 mg/dL (ref 0.0–1.2)
Total Protein: 6.4 g/dL — ABNORMAL LOW (ref 6.5–8.1)

## 2023-10-01 LAB — MAGNESIUM: Magnesium: 2.7 mg/dL — ABNORMAL HIGH (ref 1.7–2.4)

## 2023-10-01 MED ORDER — SODIUM ZIRCONIUM CYCLOSILICATE 10 G PO PACK
10.0000 g | PACK | Freq: Three times a day (TID) | ORAL | Status: DC
  Administered 2023-10-01 – 2023-10-02 (×4): 10 g via ORAL
  Filled 2023-10-01 (×4): qty 1

## 2023-10-01 MED ORDER — FUROSEMIDE 10 MG/ML IJ SOLN
120.0000 mg | Freq: Three times a day (TID) | INTRAVENOUS | Status: DC
  Administered 2023-10-01 – 2023-10-03 (×7): 120 mg via INTRAVENOUS
  Filled 2023-10-01 (×2): qty 12
  Filled 2023-10-01: qty 10
  Filled 2023-10-01: qty 12
  Filled 2023-10-01 (×3): qty 10
  Filled 2023-10-01: qty 12
  Filled 2023-10-01: qty 10

## 2023-10-01 NOTE — Progress Notes (Signed)
 Nephrology Follow-Up Consult note   Assessment/Recommendations: Janice Ryan is a/an 68 y.o. female with a past medical history significant for chronic hypoxic hypercapnic respiratory failure wears 5 L at home, OSA, asthma, HTN, severe obesity, admitted for right hip pain complicated by AKI and acute worsening respiratory failure.       Severe non-oliguric AKI: Baseline creatinine normal.  Urine sodium less than 10.  May have had some acute tubular injury but given low urine sodium other etiologies more likely.  Given volume overload prerenal azotemia less likely.  More likely some cardiorenal physiology despite normal ejection fraction recently.  Hepatorenal physiology could present in a similar fashion but given the overall clinical picture seems less likely.  Creatinine fairly stable today.  Decent urine output.  Agree with ongoing diuresis.   Hyperkalemia: Continue with Lasix  and Lokelma  as needed Acute on chronic hypoxic/ hypercapneic resp failure: Multifactorial.  Agree with IV Lasix  for now.  Further management per primary team Morbid obesity  Hyponatremia: Likely related to volume excess.  Diuresis as above.   Recommendations conveyed to primary service.    Jayson JINNY Player [facility] 10/01/2023 2:01 PM  ___________________________________________________________  CC: Hip pain  Interval History/Subjective: Patient tired.  Breathing about the same.  Decent urine output.   Medications:  Current Facility-Administered Medications  Medication Dose Route Frequency Provider Last Rate Last Admin   acetaminophen  (TYLENOL ) tablet 650 mg  650 mg Oral Q6H PRN Howerter, Justin B, DO   650 mg at 09/30/23 1759   Or   acetaminophen  (TYLENOL ) suppository 650 mg  650 mg Rectal Q6H PRN Howerter, Justin B, DO       albuterol  (PROVENTIL ) (2.5 MG/3ML) 0.083% nebulizer solution 2.5 mg  2.5 mg Nebulization Q4H PRN Howerter, Justin B, DO       Chlorhexidine  Gluconate Cloth  2 % PADS 6 each  6 each Topical Daily Howerter, Justin B, DO   6 each at 10/01/23 0605   feeding supplement (ENSURE PLUS HIGH PROTEIN) liquid 237 mL  237 mL Oral BID BM Gherghe, Costin M, MD       fluticasone  (FLONASE ) 50 MCG/ACT nasal spray 2 spray  2 spray Each Nare Daily Howerter, Justin B, DO   2 spray at 10/01/23 1028   fluticasone  furoate-vilanterol (BREO ELLIPTA ) 200-25 MCG/ACT 1 puff  1 puff Inhalation Daily Vann, Jessica U, DO   1 puff at 09/30/23 0811   furosemide  (LASIX ) 120 mg in dextrose 5 % 50 mL IVPB  120 mg Intravenous Q12H Geralynn Charleston, MD 62 mL/hr at 10/01/23 1100 Infusion Verify at 10/01/23 1100   gabapentin  (NEURONTIN ) capsule 300 mg  300 mg Oral QHS Vann, Jessica U, DO   300 mg at 09/30/23 2100   heparin injection 5,000 Units  5,000 Units Subcutaneous Q8H Gherghe, Costin M, MD   5,000 Units at 10/01/23 9394   ipratropium-albuterol  (DUONEB) 0.5-2.5 (3) MG/3ML nebulizer solution 3 mL  3 mL Nebulization TID Juvenal Raisin U, DO   3 mL at 10/01/23 0740   lidocaine  (LIDODERM ) 5 % 1 patch  1 patch Transdermal Q24H Howerter, Justin B, DO   1 patch at 10/01/23 1024   melatonin tablet 3 mg  3 mg Oral QHS PRN Howerter, Justin B, DO   3 mg at 09/29/23 2100   montelukast  (SINGULAIR ) tablet 10 mg  10 mg Oral QHS Vann, Jessica U, DO   10 mg at 09/30/23 2100   naloxone  (NARCAN ) injection 0.4 mg  0.4 mg Intravenous PRN Howerter, Justin  B, DO       ondansetron  (ZOFRAN ) injection 4 mg  4 mg Intravenous Q6H PRN Howerter, Justin B, DO       Oral care mouth rinse  15 mL Mouth Rinse PRN Howerter, Justin B, DO       oxyCODONE  (Oxy IR/ROXICODONE ) immediate release tablet 5 mg  5 mg Oral Q6H PRN Juvenal Raisin U, DO   5 mg at 09/30/23 1759   pantoprazole  (PROTONIX ) EC tablet 40 mg  40 mg Oral Daily Vann, Jessica U, DO   40 mg at 09/29/23 0900   sodium zirconium cyclosilicate  (LOKELMA ) packet 10 g  10 g Oral TID Vann, Jessica U, DO   10 g at 10/01/23 1152      Review of Systems: 10 systems reviewed  and negative except per interval history/subjective  Physical Exam: Vitals:   10/01/23 1200 10/01/23 1300  BP:  (!) 68/30  Pulse:  77  Resp:  14  Temp: 98.9 F (37.2 C)   SpO2:  100%   Total I/O In: 32.7 [IV Piggyback:32.7] Out: -   Intake/Output Summary (Last 24 hours) at 10/01/2023 1401 Last data filed at 10/01/2023 1100 Gross per 24 hour  Intake 679.44 ml  Output 1120 ml  Net -440.56 ml   Constitutional: Severe obesity, lying in bed, no distress ENMT: ears and nose without scars or lesions, MMM CV: normal rate, nonpitting edema in the lower extremities Respiratory: clear to auscultation anteriorly, normal work of breathing Gastrointestinal: soft, non-tender, no palpable masses or hernias Skin: no visible lesions or rashes Psych: alert, judgement/insight appropriate, appropriate mood and affect   Test Results I personally reviewed new and old clinical labs and radiology tests Lab Results  Component Value Date   NA 128 (L) 10/01/2023   K 5.5 (H) 10/01/2023   CL 91 (L) 10/01/2023   CO2 25 10/01/2023   BUN 72 (H) 10/01/2023   CREATININE 4.20 (H) 10/01/2023   CALCIUM 8.3 (L) 10/01/2023   ALBUMIN 3.1 (L) 10/01/2023   PHOS 4.0 09/27/2023    CBC Recent Labs  Lab 09/26/23 1117 09/27/23 0102 09/29/23 0314 10/01/23 0529  WBC 10.7* 10.5 9.6 9.3  NEUTROABS 7.2 6.7  --   --   HGB 11.0* 10.4* 10.1* 8.8*  HCT 37.3 36.3 34.6* 29.6*  MCV 98.7 99.5 98.6 95.8  PLT 277 273 255 223

## 2023-10-01 NOTE — Progress Notes (Signed)
 PROGRESS NOTE  Janice Ryan FMW:[id number] DOB: [birth year] DOA: 09/26/2023 PCP: Lorren Greig PARAS, NP   LOS: 5 days   Brief Narrative / Interim history: 68 y.o. female with medical history significant for chronic hypoxic hypercapnic respiratory failure-wears 5 L at home, obstructive sleep apnea, moderate persistent asthma, essential hypertension, generalized anxiety disorder, allergic rhinitis, who is admitted to [facility] on 09/26/2023 with acute on chronic hypoxic hypercapnic respiratory failure after presenting from home to [facility] ED complaining of right hip pain.  This happened after tripping and falling at home on 6/24.  Following the fall she had right hip discomfort radiating into the right groin every time she attempted to bear weight.  Hospital stay complicated by worsening kidney function  Subjective / 24h Interval events: Says she is uncomfortable in bed  Assesement and Plan: Principal Problem:   Acute on chronic respiratory failure with hypercapnia (HCC) Active Problems:   Essential hypertension   Moderate persistent asthma   GAD (generalized anxiety disorder)   Morbid obesity (HCC)   Acute right hip pain   Low back pain   Acute encephalopathy   Allergic rhinitis   Acute on chronic hypoxic and hypercarbic respiratory failure-improved, respiratory status appears back to baseline and she is on 5 L this morning.  She is supposed to be on BiPAP nightly, but not very compliant.  Echocardiogram 6/20 showed normal LVEF  Acute kidney injury -patient's creatinine on presentation was 1.2.  She has some intermittent low blood pressure reads but she is normotensive now for most part.  Yesterday her creatinine was 3.4, was started on IV fluids, but creatinine is getting worse today at 4.1.  Renal ultrasound unremarkable, she is not retaining.  Urine output only 400 cc overnight - Nephrology consulted,-- trending BMP  Hyperkalemia-due to AKI, Lokelma  again  Acute  toxic/metabolic encephalopathy-became somnolent after multiple doses of Dilaudid  and Ativan  in the ED.  Now resolved -avoid IV narcotics  Right hip pain-imaging with CT did not show any evidence of fractures.  Pain control, physical therapy  Chronic low back pain -slightly worse now after the fall.  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  Continue supportive care  Obesity, morbid-BMI 63.  She would benefit from weight loss     Scheduled Meds:  Chlorhexidine  Gluconate Cloth  6 each Topical Daily   feeding supplement  237 mL Oral BID BM   fluticasone   2 spray Each Nare Daily   fluticasone  furoate-vilanterol  1 puff Inhalation Daily   gabapentin   300 mg Oral QHS   heparin injection (subcutaneous)  5,000 Units Subcutaneous Q8H   ipratropium-albuterol   3 mL Nebulization TID   lidocaine   1 patch Transdermal Q24H   montelukast   10 mg Oral QHS   pantoprazole   40 mg Oral Daily   sodium zirconium cyclosilicate   10 g Oral TID   Continuous Infusions:  furosemide  62 mL/hr at 10/01/23 1100   PRN Meds:.acetaminophen  **OR** acetaminophen , albuterol , melatonin, naLOXone  (NARCAN )  injection, ondansetron  (ZOFRAN ) IV, mouth rinse, oxyCODONE   Current Outpatient Medications  Medication Instructions   Acetaminophen  Extra Strength 1,000 mg, Oral, Every 6 hours PRN   albuterol  (VENTOLIN  HFA) 108 (90 Base) MCG/ACT inhaler 2 puffs, Inhalation, Every 6 hours PRN   amoxicillin -clavulanate (AUGMENTIN ) 875-125 MG tablet 1 tablet, Oral, 2 times daily   amphetamine-dextroamphetamine (ADDERALL XR) 20 MG 24 hr capsule 20 mg, Oral, Daily   busPIRone  (BUSPAR ) 15 mg, Oral,  3 times daily   cetirizine  (ZYRTEC  ALLERGY) 10 mg, Oral, Daily   cholecalciferol  (VITAMIN D3) 1,000 Units, Oral, Daily   doxepin  (SINEQUAN ) 50 mg, Oral, Every morning   DULoxetine  (CYMBALTA ) 120 mg, Oral, Daily   fluticasone  (FLONASE ) 50 MCG/ACT  nasal spray 2 sprays, Each Nare, Daily   fluticasone -salmeterol (ADVAIR) 500-50 MCG/ACT AEPB 1 puff, Inhalation, 2 times daily   gabapentin  (NEURONTIN ) 300 mg, Oral, Daily at bedtime   lisinopril  (ZESTRIL ) 20 mg, Oral, Daily   meloxicam  (MOBIC ) 15 mg, Oral, 2 times daily   mirtazapine  (REMERON ) 15 mg, Oral, Daily at bedtime   montelukast  (SINGULAIR ) 10 mg, Oral, Daily at bedtime   Multiple Vitamin (MULTIVITAMIN WITH MINERALS) TABS tablet 1 tablet, Oral, Daily   Omega-3 Fatty Acids (OMEGA-3 PO) 1 capsule, Oral, Daily   oxyCODONE  (OXY IR/ROXICODONE ) 5 mg, Oral, Every 6 hours PRN   oxyCODONE -acetaminophen  (PERCOCET) 5-325 MG tablet Take 1 every 6 hours for pain not relieved by Tylenol  or Motrin  along   OXYGEN 5 L, Inhalation, Continuous   pantoprazole  (PROTONIX ) 40 mg, Oral, Daily   predniSONE  (DELTASONE ) 20 mg, Oral, Daily   traZODone  (DESYREL ) 50 mg, Oral, At bedtime PRN    Diet Orders (From admission, onward)     Start     Ordered   09/30/23 1304  Diet renal with fluid restriction Fluid restriction: 1200 mL Fluid; Room service appropriate? Yes; Fluid consistency: Thin  Diet effective now       Question Answer Comment  Fluid restriction: 1200 mL Fluid   Room service appropriate? Yes   Fluid consistency: Thin      09/30/23 1303            DVT prophylaxis: heparin injection 5,000 Units Start: 09/30/23 2200 SCDs Start: 09/26/23 2031   Lab Results  Component Value Date   PLT 223 10/01/2023      Code Status: Full Code  Family Communication: No family at bedside  Status is: Inpatient Remains inpatient appropriate because: Severity of illness   Level of care: Stepdown  Consultants:  Nephrology  Objective: Vitals:   10/01/23 0902 10/01/23 1000 10/01/23 1100 10/01/23 1200  BP: (!) 110/46  120/79   Pulse: 82 77 89   Resp: 19 17 (!) 23   Temp:    98.9 F (37.2 C)  TempSrc:    Oral  SpO2: 90% 95% (!) 75%   Weight:      Height:        Intake/Output Summary  (Last 24 hours) at 10/01/2023 1303 Last data filed at 10/01/2023 1100 Gross per 24 hour  Intake 679.44 ml  Output 1120 ml  Net -440.56 ml   Wt Readings from Last 3 Encounters:  10/01/23 (!) 190.1 kg  09/11/23 (!) 183.7 kg  06/25/23 (!) 167.8 kg    Examination:   General: Appearance:    Severely obese female in no acute distress     Lungs:     respirations unlabored  Heart:    Normal heart rate.   MS:   All extremities are intact.   Neurologic:   Awake, alert     Data Reviewed: I have independently reviewed following labs and imaging studies   CBC Recent Labs  Lab 09/26/23 1117 09/27/23 0102 09/29/23 0314 10/01/23 0529  WBC 10.7* 10.5 9.6 9.3  HGB 11.0* 10.4* 10.1* 8.8*  HCT 37.3 36.3 34.6* 29.6*  PLT 277 273 255 223  MCV 98.7 99.5 98.6 95.8  MCH 29.1 28.5 28.8 28.5  MCHC 29.5*  28.7* 29.2* 29.7*  RDW 11.9 12.1 12.3 12.4  LYMPHSABS 2.5 2.5  --   --   MONOABS 0.8 1.1*  --   --   EOSABS 0.1 0.2  --   --   BASOSABS 0.0 0.0  --   --     Recent Labs  Lab 09/26/23 1117 09/26/23 1948 09/27/23 0102 09/27/23 0104 09/27/23 0655 09/29/23 0314 09/29/23 1828 09/30/23 0315 10/01/23 0336  NA 142  --  139  --   --  134* 130* 128* 128*  K 4.9  --  4.7  --   --  5.0 5.0 5.8* 5.5*  CL 97*  --  94*  --   --  91* 90* 89* 91*  CO2 36*  --  36*  --   --  31 29 30 25   GLUCOSE 105*  --  116*  --   --  120* 115* 105* 109*  BUN 12  --  16  --   --  47* 54* 58* 72*  CREATININE 0.97  --  1.26*  --   --  3.44* 3.41* 4.10* 4.20*  CALCIUM 9.9  --  9.6  --   --  9.2 8.8* 8.7* 8.3*  AST 12*  --  10*  --   --   --   --  9* 10*  ALT 15  --  15  --   --   --   --  12 10  ALKPHOS 82  --  79  --   --   --   --  79 81  BILITOT 0.7  --  0.6  --   --   --   --  0.7 0.6  ALBUMIN 3.5  --  3.3*  --   --   --   --  3.2* 3.1*  MG  --   --  2.2  --   --   --   --   --  2.7*  PROCALCITON  --   --   --  <0.10  --   --   --   --   --   TSH  --   --   --   --  0.225*  --   --   --   --   BNP  --   101.1*  --   --   --   --   --   --   --     ------------------------------------------------------------------------------------------------------------------ No results for input(s): CHOL, HDL, LDLCALC, TRIG, CHOLHDL, LDLDIRECT in the last 72 hours.  Lab Results  Component Value Date   HGBA1C 5.1 06/25/2023   ------------------------------------------------------------------------------------------------------------------ No results for input(s): TSH, T4TOTAL, T3FREE, THYROIDAB in the last 72 hours.  Invalid input(s): FREET3  Cardiac Enzymes No results for input(s): CKMB, TROPONINI, MYOGLOBIN in the last 168 hours.  Invalid input(s): CK ------------------------------------------------------------------------------------------------------------------    Component Value Date/Time   BNP 101.1 (H) 09/26/2023 1948    CBG: No results for input(s): GLUCAP in the last 168 hours.  No results found for this or any previous visit (from the past 240 hours).   Radiology Studies: No results found.   Harlene Bowl DO [facility]  Between 7 am - 7 pm I am available, please contact me via Amion (for emergencies) or Securechat (non urgent messages)  Between 7 pm - 7 am I am not available, please contact night coverage MD/APP via Amion

## 2023-10-01 NOTE — Plan of Care (Signed)

## 2023-10-01 NOTE — Progress Notes (Signed)
 Physical Therapy Treatment Patient Details Name: Janice Ryan MRN: [id number] DOB: 23-Dec-1955 Today's Date: 10/01/2023   History of Present Illness Janice Ryan is a 68 y.o. female with medical history significant for chronic hypoxic hypercapnic respiratory failure, obstructive sleep apnea, moderate persistent asthma, essential hypertension, generalized anxiety disorder, allergic rhinitis, who is admitted to [facility] on 09/26/2023 with acute on chronic hypoxic hypercapnic respiratory failure after presenting from home to [facility] ED complaining of right hip pain.    PT Comments   Pt admitted with above diagnosis.  Pt currently with functional limitations due to the deficits listed below (see PT Problem List). Pt in bed when PT arrived. Pt exhibits a change in medical condition and abn labs impacting pt motor control and coordination. Pt O2 saturation varied on 10 L/min throughout intervention and 72-100% unclear as to reliability of O2 saturation due to involuntary B UE jerking like mm movements. Pt required mod A for supine to sit with A for B LE and trunk, min A for sit to supine, CGA to min A for sitting balance EOB and constant coaching for pursed lip breathing. Pt left in bed and all needs in place, nursing staff aware of fluctuation in O2 readings. Patient will benefit from continued inpatient follow up therapy, <3 hours/day.  Pt will benefit from acute skilled PT to increase their independence and safety with mobility to allow discharge.      If plan is discharge home, recommend the following: Two people to help with walking and/or transfers;A lot of help with bathing/dressing/bathroom;Assistance with cooking/housework;Assist for transportation;Help with stairs or ramp for entrance   Can travel by private vehicle     No  Equipment Recommendations  Rolling walker (2 wheels) (bari RW)    Recommendations for Other Services       Precautions / Restrictions  Precautions Precautions: Fall Required Braces or Orthoses: Knee Immobilizer - Right Knee Immobilizer - Right: On when out of bed or walking Restrictions Weight Bearing Restrictions Per Provider Order: No     Mobility  Bed Mobility Overal bed mobility: Needs Assistance Bed Mobility: Supine to Sit, Sit to Supine     Supine to sit: HOB elevated, Used rails, Mod assist Sit to supine: Min assist, HOB elevated, Used rails   General bed mobility comments: pt requiring increased physical assist with mobiltiy tasks today and change in medical presentation over the weekend    Transfers                   General transfer comment: NT due to change in medical condition    Ambulation/Gait                   Stairs             Wheelchair Mobility     Tilt Bed    Modified Rankin (Stroke Patients Only)       Balance Overall balance assessment: History of Falls, Needs assistance Sitting-balance support: Feet supported Sitting balance-Leahy Scale: Fair                                      Hotel manager: No apparent difficulties  Cognition Arousal: Alert Behavior During Therapy: WFL for tasks assessed/performed   PT - Cognitive impairments: No apparent impairments  Following commands: Intact      Cueing Cueing Techniques: Verbal cues  Exercises      General Comments        Pertinent Vitals/Pain Pain Assessment Pain Assessment: 0-10 Pain Score: 9  Pain Location: R knee Pain Descriptors / Indicators: Aching, Constant, Contraction, Discomfort, Grimacing, Moaning Pain Intervention(s): Limited activity within patient's tolerance, Monitored during session, Repositioned    Home Living                          Prior Function            PT Goals (current goals can now be found in the care plan section) Acute Rehab PT Goals Patient Stated Goal: to be able to  get stronger, loose weight and have R knee surgery PT Goal Formulation: With patient Time For Goal Achievement: 10/11/23 Potential to Achieve Goals: Good Progress towards PT goals: Progressing toward goals (slowly  secondary to change in medical status)    Frequency    Min 2X/week      PT Plan      Co-evaluation              AM-PAC PT 6 Clicks Mobility   Outcome Measure  Help needed turning from your back to your side while in a flat bed without using bedrails?: A Lot Help needed moving from lying on your back to sitting on the side of a flat bed without using bedrails?: A Lot Help needed moving to and from a bed to a chair (including a wheelchair)?: Total Help needed standing up from a chair using your arms (e.g., wheelchair or bedside chair)?: Total Help needed to walk in hospital room?: Total Help needed climbing 3-5 steps with a railing? : Total 6 Click Score: 8    End of Session Equipment Utilized During Treatment: Oxygen Activity Tolerance: Patient limited by fatigue;Other (comment) (SOB) Patient left: in bed;with call bell/phone within reach Nurse Communication: Mobility status;Other (comment) PT Visit Diagnosis: Unsteadiness on feet (R26.81);Other abnormalities of gait and mobility (R26.89);Repeated falls (R29.6);Muscle weakness (generalized) (M62.81);Difficulty in walking, not elsewhere classified (R26.2);Pain Pain - Right/Left: Right Pain - part of body: Knee;Leg     Time: 8955-8889 PT Time Calculation (min) (ACUTE ONLY): 26 min  Charges:    $Therapeutic Activity: 23-37 mins PT General Charges $$ ACUTE PT VISIT: 1 Visit                     [place], PT Acute Rehab    [place] VEAR Drone 10/01/2023, 12:37 PM

## 2023-10-02 DIAGNOSIS — J9622 Acute and chronic respiratory failure with hypercapnia: Secondary | ICD-10-CM | POA: Diagnosis not present

## 2023-10-02 LAB — BASIC METABOLIC PANEL WITH GFR
Anion gap: 15 (ref 5–15)
BUN: 83 mg/dL — ABNORMAL HIGH (ref 8–23)
CO2: 28 mmol/L (ref 22–32)
Calcium: 9.2 mg/dL (ref 8.9–10.3)
Chloride: 91 mmol/L — ABNORMAL LOW (ref 98–111)
Creatinine, Ser: 2.98 mg/dL — ABNORMAL HIGH (ref 0.44–1.00)
GFR, Estimated: 17 mL/min — ABNORMAL LOW (ref >60)
Glucose, Bld: 101 mg/dL — ABNORMAL HIGH (ref 70–99)
Potassium: 4.7 mmol/L (ref 3.5–5.1)
Sodium: 134 mmol/L — ABNORMAL LOW (ref 135–145)

## 2023-10-02 LAB — CBC
HCT: 29.8 % — ABNORMAL LOW (ref 36.0–46.0)
Hemoglobin: 9.1 g/dL — ABNORMAL LOW (ref 12.0–15.0)
MCH: 28.8 pg (ref 26.0–34.0)
MCHC: 30.5 g/dL (ref 30.0–36.0)
MCV: 94.3 fL (ref 80.0–100.0)
Platelets: 219 10*3/uL (ref 150–400)
RBC: 3.16 MIL/uL — ABNORMAL LOW (ref 3.87–5.11)
RDW: 12.5 % (ref 11.5–15.5)
WBC: 8.3 10*3/uL (ref 4.0–10.5)
nRBC: 0 % (ref 0.0–0.2)

## 2023-10-02 MED ORDER — DOCUSATE SODIUM 50 MG PO CAPS
50.0000 mg | ORAL_CAPSULE | Freq: Once | ORAL | Status: DC
  Filled 2023-10-02: qty 1

## 2023-10-02 MED ORDER — DOCUSATE SODIUM 100 MG PO CAPS
100.0000 mg | ORAL_CAPSULE | Freq: Once | ORAL | Status: AC
  Administered 2023-10-02: 100 mg via ORAL
  Filled 2023-10-02: qty 1

## 2023-10-02 MED ORDER — SIMETHICONE 80 MG PO CHEW
80.0000 mg | CHEWABLE_TABLET | Freq: Once | ORAL | Status: AC
  Administered 2023-10-02: 80 mg via ORAL
  Filled 2023-10-02: qty 1

## 2023-10-02 MED ORDER — OXYCODONE HCL 5 MG PO TABS
2.5000 mg | ORAL_TABLET | Freq: Four times a day (QID) | ORAL | Status: DC | PRN
  Administered 2023-10-03: 2.5 mg via ORAL
  Administered 2023-10-04 – 2023-10-09 (×12): 5 mg via ORAL
  Filled 2023-10-02 (×14): qty 1

## 2023-10-02 NOTE — Progress Notes (Addendum)
 PROGRESS NOTE  Janice Ryan FMW:[id number] DOB: January 10, 1956 DOA: 09/26/2023 PCP: Lorren Greig PARAS, NP   LOS: 6 days   Brief Narrative / Interim history: 68 y.o. female with medical history significant for chronic hypoxic hypercapnic respiratory failure-wears 5 L at home, obstructive sleep apnea, moderate persistent asthma, essential hypertension, generalized anxiety disorder, allergic rhinitis, who is admitted to [facility] on 09/26/2023 with acute on chronic hypoxic hypercapnic respiratory failure after presenting from home to [facility] ED complaining of right hip pain.  This happened after tripping and falling at home on 6/24.  Following the fall she had right hip discomfort radiating into the right groin every time she attempted to bear weight.  Hospital stay complicated by worsening kidney function due to volume overload  Subjective / 24h Interval events: Just got a dose of pain medicine so was sleepy this morning  Assesement and Plan: Principal Problem:   Acute on chronic respiratory failure with hypercapnia (HCC) Active Problems:   Essential hypertension   Moderate persistent asthma   GAD (generalized anxiety disorder)   Morbid obesity (HCC)   Acute right hip pain   Low back pain   Acute encephalopathy   Allergic rhinitis   Acute on chronic hypoxic and hypercarbic respiratory failure-improved, respiratory status appears back to baseline and she is on 5 L this morning.  She is supposed to be on BiPAP nightly, but not very compliant.  Echocardiogram 6/20 showed normal LVEF - IV Lasix  per nephrology  Acute kidney injury -patient's creatinine on presentation was 1.2.  She has some intermittent low blood pressure reads but she is normotensive now for most part.  Yesterday her creatinine was 3.4, was started on IV fluids, but creatinine worsened at 4.1.  Renal ultrasound unremarkable, she is not retaining.  Urine output only 400 cc overnight - Nephrology consulted: Started on  Lasix  drip with improvement in creatinine -Foley placed for strict I's and O's so we will plan to remove once no longer needed  Hyperkalemia-due to AKI - Status post Lokelma   Acute toxic/metabolic encephalopathy-became somnolent after multiple doses of Dilaudid  and Ativan  in the ED.  Now resolved -avoid IV narcotics  Right hip pain-imaging with CT did not show any evidence of fractures.  Pain control, physical therapy  Chronic low back pain -slightly worse now after the fall.  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  Continue supportive care  Obesity, morbid-BMI 63.  She would benefit from weight loss   Eventual plan for SNF once she has been weaned to nasal cannula and creatinine has recovered   Scheduled Meds:  Chlorhexidine  Gluconate Cloth  6 each Topical Daily   feeding supplement  237 mL Oral BID BM   fluticasone   2 spray Each Nare Daily   fluticasone  furoate-vilanterol  1 puff Inhalation Daily   gabapentin   300 mg Oral QHS   heparin injection (subcutaneous)  5,000 Units Subcutaneous Q8H   ipratropium-albuterol   3 mL Nebulization TID   lidocaine   1 patch Transdermal Q24H   montelukast   10 mg Oral QHS   pantoprazole   40 mg Oral Daily   Continuous Infusions:  furosemide  120 mg (10/02/23 0918)   PRN Meds:.acetaminophen  **OR** acetaminophen , albuterol , melatonin, naLOXone  (NARCAN )  injection, ondansetron  (ZOFRAN ) IV, mouth rinse, oxyCODONE   Current Outpatient Medications  Medication Instructions   Acetaminophen  Extra Strength 1,000 mg, Oral, Every 6 hours PRN   albuterol  (VENTOLIN  HFA) 108 (90 Base) MCG/ACT  inhaler 2 puffs, Inhalation, Every 6 hours PRN   amoxicillin -clavulanate (AUGMENTIN ) 875-125 MG tablet 1 tablet, Oral, 2 times daily   amphetamine-dextroamphetamine (ADDERALL XR) 20 MG 24 hr capsule 20 mg, Oral, Daily   busPIRone  (BUSPAR ) 15 mg, Oral, 3 times daily    cetirizine  (ZYRTEC  ALLERGY) 10 mg, Oral, Daily   cholecalciferol  (VITAMIN D3) 1,000 Units, Oral, Daily   doxepin  (SINEQUAN ) 50 mg, Oral, Every morning   DULoxetine  (CYMBALTA ) 120 mg, Oral, Daily   fluticasone  (FLONASE ) 50 MCG/ACT nasal spray 2 sprays, Each Nare, Daily   fluticasone -salmeterol (ADVAIR) 500-50 MCG/ACT AEPB 1 puff, Inhalation, 2 times daily   gabapentin  (NEURONTIN ) 300 mg, Oral, Daily at bedtime   lisinopril  (ZESTRIL ) 20 mg, Oral, Daily   meloxicam  (MOBIC ) 15 mg, Oral, 2 times daily   mirtazapine  (REMERON ) 15 mg, Oral, Daily at bedtime   montelukast  (SINGULAIR ) 10 mg, Oral, Daily at bedtime   Multiple Vitamin (MULTIVITAMIN WITH MINERALS) TABS tablet 1 tablet, Oral, Daily   Omega-3 Fatty Acids (OMEGA-3 PO) 1 capsule, Oral, Daily   oxyCODONE  (OXY IR/ROXICODONE ) 5 mg, Oral, Every 6 hours PRN   oxyCODONE -acetaminophen  (PERCOCET) 5-325 MG tablet Take 1 every 6 hours for pain not relieved by Tylenol  or Motrin  along   OXYGEN 5 L, Inhalation, Continuous   pantoprazole  (PROTONIX ) 40 mg, Oral, Daily   predniSONE  (DELTASONE ) 20 mg, Oral, Daily   traZODone  (DESYREL ) 50 mg, Oral, At bedtime PRN    Diet Orders (From admission, onward)     Start     Ordered   09/30/23 1304  Diet renal with fluid restriction Fluid restriction: 1200 mL Fluid; Room service appropriate? Yes; Fluid consistency: Thin  Diet effective now       Question Answer Comment  Fluid restriction: 1200 mL Fluid   Room service appropriate? Yes   Fluid consistency: Thin      09/30/23 1303            DVT prophylaxis: heparin injection 5,000 Units Start: 09/30/23 2200 SCDs Start: 09/26/23 2031   Lab Results  Component Value Date   PLT 219 10/02/2023      Code Status: Full Code  Family Communication: No family at bedside  Status is: Inpatient Remains inpatient appropriate because: Severity of illness   Level of care: Stepdown  Consultants:  Nephrology  Objective: Vitals:   10/02/23 0411 10/02/23  0414 10/02/23 0500 10/02/23 0800  BP:   128/73   Pulse: 79  76   Resp: 17  19   Temp:  98.7 F (37.1 C)  98.5 F (36.9 C)  TempSrc:  Axillary  Axillary  SpO2: 94%  97%   Weight:      Height:        Intake/Output Summary (Last 24 hours) at 10/02/2023 1036 Last data filed at 10/02/2023 0900 Gross per 24 hour  Intake 1265.57 ml  Output 5300 ml  Net -4034.43 ml   Wt Readings from Last 3 Encounters:  10/01/23 (!) 190.1 kg  09/11/23 (!) 183.7 kg  06/25/23 (!) 167.8 kg    Examination:   General: Appearance:    Severely obese female in no acute distress     Lungs:     respirations unlabored  Heart:    Normal heart rate.   MS:   All extremities are intact.   Neurologic: Sleepy this a.m.     Data Reviewed: I have independently reviewed following labs and imaging studies   CBC Recent Labs  Lab 09/26/23 1117 09/27/23 0102 09/29/23 9685  10/01/23 0529 10/02/23 0603  WBC 10.7* 10.5 9.6 9.3 8.3  HGB 11.0* 10.4* 10.1* 8.8* 9.1*  HCT 37.3 36.3 34.6* 29.6* 29.8*  PLT 277 273 255 223 219  MCV 98.7 99.5 98.6 95.8 94.3  MCH 29.1 28.5 28.8 28.5 28.8  MCHC 29.5* 28.7* 29.2* 29.7* 30.5  RDW 11.9 12.1 12.3 12.4 12.5  LYMPHSABS 2.5 2.5  --   --   --   MONOABS 0.8 1.1*  --   --   --   EOSABS 0.1 0.2  --   --   --   BASOSABS 0.0 0.0  --   --   --     Recent Labs  Lab 09/26/23 1117 09/26/23 1948 09/27/23 0102 09/27/23 0104 09/27/23 9344 09/29/23 0314 09/29/23 1828 09/30/23 0315 10/01/23 0336 10/02/23 0603  NA 142  --  139  --   --  134* 130* 128* 128* 134*  K 4.9  --  4.7  --   --  5.0 5.0 5.8* 5.5* 4.7  CL 97*  --  94*  --   --  91* 90* 89* 91* 91*  CO2 36*  --  36*  --   --  31 29 30 25 28   GLUCOSE 105*  --  116*  --   --  120* 115* 105* 109* 101*  BUN 12  --  16  --   --  47* 54* 58* 72* 83*  CREATININE 0.97  --  1.26*  --   --  3.44* 3.41* 4.10* 4.20* 2.98*  CALCIUM 9.9  --  9.6  --   --  9.2 8.8* 8.7* 8.3* 9.2  AST 12*  --  10*  --   --   --   --  9* 10*  --   ALT  15  --  15  --   --   --   --  12 10  --   ALKPHOS 82  --  79  --   --   --   --  79 81  --   BILITOT 0.7  --  0.6  --   --   --   --  0.7 0.6  --   ALBUMIN 3.5  --  3.3*  --   --   --   --  3.2* 3.1*  --   MG  --   --  2.2  --   --   --   --   --  2.7*  --   PROCALCITON  --   --   --  <0.10  --   --   --   --   --   --   TSH  --   --   --   --  0.225*  --   --   --   --   --   BNP  --  101.1*  --   --   --   --   --   --   --   --     ------------------------------------------------------------------------------------------------------------------ No results for input(s): CHOL, HDL, LDLCALC, TRIG, CHOLHDL, LDLDIRECT in the last 72 hours.  Lab Results  Component Value Date   HGBA1C 5.1 06/25/2023   ------------------------------------------------------------------------------------------------------------------ No results for input(s): TSH, T4TOTAL, T3FREE, THYROIDAB in the last 72 hours.  Invalid input(s): FREET3  Cardiac Enzymes No results for input(s): CKMB, TROPONINI, MYOGLOBIN in the last 168 hours.  Invalid input(s): CK ------------------------------------------------------------------------------------------------------------------    Component Value Date/Time  BNP 101.1 (H) 09/26/2023 1948    CBG: No results for input(s): GLUCAP in the last 168 hours.  No results found for this or any previous visit (from the past 240 hours).   Radiology Studies: No results found.   Harlene Bowl DO [facility]  Between 7 am - 7 pm I am available, please contact me via Amion (for emergencies) or Securechat (non urgent messages)  Between 7 pm - 7 am I am not available, please contact night coverage MD/APP via Amion

## 2023-10-02 NOTE — Progress Notes (Signed)
 Occupational Therapy Treatment Patient Details Name: Janice Ryan MRN: [id number] DOB: Oct 12, 1955 Today's Date: 10/02/2023   History of present illness Brady Plant is a 68 y.o. female with medical history significant for chronic hypoxic hypercapnic respiratory failure, obstructive sleep apnea, moderate persistent asthma, essential hypertension, generalized anxiety disorder, allergic rhinitis, who is admitted to [facility] on 09/26/2023 with acute on chronic hypoxic hypercapnic respiratory failure after presenting from home to [facility] ED complaining of right hip pain.   OT comments  Patient seen for skilled OT session this am. Drowsy initially but able to participate fully with brother visiting bedside. Once up to EOB, patient SOB with jerky movements and desat to 80% with recovery >92%  once back in chair position of ICU bed. Cues for breathing and pacing activity especially with mobility. Patient did have kidney issues the past 2 days with decline in function but improving as per medical team. OT will continue to follow acutely to progress function. Patient will benefit from continued inpatient follow up therapy, <3 hours/day.        If plan is discharge home, recommend the following:  A lot of help with bathing/dressing/bathroom;Two people to help with walking and/or transfers;Direct supervision/assist for medications management;Direct supervision/assist for financial management;Assistance with cooking/housework;Assist for transportation;Help with stairs or ramp for entrance   Equipment Recommendations  Tub/shower bench;Other (comment) (HD RW)        Precautions / Restrictions Precautions Precautions: Fall Required Braces or Orthoses: Knee Immobilizer - Right Knee Immobilizer - Right: On when out of bed or walking Restrictions Weight Bearing Restrictions Per Provider Order: No       Mobility Bed Mobility Overal bed mobility: Needs Assistance Bed Mobility:  Supine to Sit, Sit to Supine     Supine to sit: HOB elevated, Used rails, Mod assist, +2 for physical assistance, +2 for safety/equipment Sit to supine: HOB elevated, Used rails, +2 for physical assistance, +2 for safety/equipment, Max assist   General bed mobility comments: pt continues to require increased physical assist A x 2 for safety, B UE and LE involuntary mm jerking movements when seated EOB impacting safety and progression toward sit to stand, total A to don/doff R KI in supine    Transfers                   General transfer comment: NT due to change in medical condition     Balance Overall balance assessment: History of Falls, Needs assistance Sitting-balance support: Feet supported, Bilateral upper extremity supported Sitting balance-Leahy Scale: Poor                                     ADL either performed or assessed with clinical judgement   ADL Overall ADL's : Needs assistance/impaired Eating/Feeding: Set up   Grooming: Wash/dry hands;Wash/dry face;Minimal assistance;Sitting   Upper Body Bathing: Moderate assistance;Sitting   Lower Body Bathing: Total assistance;Bed level   Upper Body Dressing : Moderate assistance;Sitting   Lower Body Dressing: Total assistance;Bed level     Toilet Transfer Details (indicate cue type and reason): EOB patient became SOB and needed to rest         Functional mobility during ADLs:  (unable to perform STS this session due to SOB EOB) General ADL Comments: max cues for breathing integration during session    Extremity/Trunk Assessment Upper Extremity Assessment Upper Extremity Assessment: Overall WFL for tasks assessed;Right hand dominant  Lower Extremity Assessment Lower Extremity Assessment: Defer to PT evaluation        Vision   Vision Assessment?: No apparent visual deficits         Communication Communication Communication: No apparent difficulties   Cognition Arousal:  Alert Behavior During Therapy: WFL for tasks assessed/performed Cognition: Cognition impaired     Awareness: Online awareness impaired, Intellectual awareness impaired Memory impairment (select all impairments): Short-term memory (did not recall PT from day before)   Executive functioning impairment (select all impairments): Problem solving OT - Cognition Comments: mild processing delay                 Following commands: Intact        Cueing   Cueing Techniques: Verbal cues        General Comments 8L HFO2 via [place], cues for breathing integration especially during mobility    Pertinent Vitals/ Pain       Pain Assessment Pain Assessment: No/denies pain   Frequency  Min 2X/week        Progress Toward Goals  OT Goals(current goals can now be found in the care plan section)  Progress towards OT goals: Not progressing toward goals - comment (had medical change 2 days ago but is stabilizing)  Acute Rehab OT Goals Patient Stated Goal: to get to rehab OT Goal Formulation: With patient/family Time For Goal Achievement: 10/11/23 Potential to Achieve Goals: Fair ADL Goals Pt Will Perform Lower Body Dressing: with min assist;with adaptive equipment;sit to/from stand Pt Will Transfer to Toilet: with min assist;with +2 assist;bedside commode;ambulating Pt Will Perform Toileting - Clothing Manipulation and hygiene: with adaptive equipment;with mod assist;sit to/from stand Pt/caregiver will Perform Home Exercise Program: Both right and left upper extremity;Increased strength;Independently;With written HEP provided Additional ADL Goal #1: Patient will demonstrate teach back of ECT and breathing strategies dueing ADL' and mobility indepednently  Plan      Co-evaluation    PT/OT/SLP Co-Evaluation/Treatment: Yes Reason for Co-Treatment: Complexity of the patient's impairments (multi-system involvement);Necessary to address cognition/behavior during functional activity;For  patient/therapist safety PT goals addressed during session: Mobility/safety with mobility;Balance;Proper use of DME OT goals addressed during session: ADL's and self-care;Proper use of Adaptive equipment and DME      AM-PAC OT 6 Clicks Daily Activity     Outcome Measure   Help from another person eating meals?: A Little Help from another person taking care of personal grooming?: A Little Help from another person toileting, which includes using toliet, bedpan, or urinal?: A Lot Help from another person bathing (including washing, rinsing, drying)?: A Lot Help from another person to put on and taking off regular upper body clothing?: A Lot Help from another person to put on and taking off regular lower body clothing?: A Lot 6 Click Score: 14    End of Session Equipment Utilized During Treatment: Oxygen;Right knee immobilizer;Rolling walker (2 wheels);Gait belt  OT Visit Diagnosis: Unsteadiness on feet (R26.81);Other abnormalities of gait and mobility (R26.89);Repeated falls (R29.6);Muscle weakness (generalized) (M62.81);History of falling (Z91.81);Pain   Activity Tolerance Patient limited by fatigue   Patient Left with call bell/phone within reach;with bed alarm set;in bed;Other (comment) (chair position)   Nurse Communication Mobility status;Other (comment) (became SOB)        Time: 8945-8884 OT Time Calculation (min): 21 min  Charges: OT General Charges $OT Visit: 1 Visit  Carrolyn Hilmes OT/L Acute Rehabilitation Department  [phone]  10/02/2023, 11:59 AM

## 2023-10-02 NOTE — Progress Notes (Signed)
 Nephrology Follow-Up Consult note   Assessment/Recommendations: Janice Ryan is a/an 68 y.o. female with a past medical history significant for chronic hypoxic hypercapnic respiratory failure wears 5 L at home, OSA, asthma, HTN, severe obesity, admitted for right hip pain complicated by AKI and acute worsening respiratory failure.       Severe non-oliguric AKI: Baseline creatinine normal.  Urine sodium less than 10.  May have had some acute tubular injury but given low urine sodium other etiologies more likely.  Given volume overload prerenal azotemia less likely.  More likely some cardiorenal physiology despite normal ejection fraction recently.  Hepatorenal physiology could present in a similar fashion but given the overall clinical picture seems less likely.  Creatinine now improving with great urine output.  Continue with current management.  Likely will sign off tomorrow if creatinine is improving further Hyperkalemia: Now improved Acute on chronic hypoxic/ hypercapneic resp failure: Multifactorial.  Continue with IV Lasix .  Seems to be improving.  Likely has a lot of volume overload.  Hard to assess given body habitus Morbid obesity  Hyponatremia: Likely related to volume excess.  Diuresis as above.  Much improved   Recommendations conveyed to primary service.    Jayson JINNY Player [facility] 10/02/2023 2:40 PM  ___________________________________________________________  CC: Hip pain  Interval History/Subjective: Patient feels well today.  Urine output robust.  Creatinine downtrending.  Breathing improving.   Medications:  Current Facility-Administered Medications  Medication Dose Route Frequency Provider Last Rate Last Admin   acetaminophen  (TYLENOL ) tablet 650 mg  650 mg Oral Q6H PRN Howerter, Justin B, DO   650 mg at 09/30/23 1759   Or   acetaminophen  (TYLENOL ) suppository 650 mg  650 mg Rectal Q6H PRN Howerter, Justin B, DO       albuterol   (PROVENTIL ) (2.5 MG/3ML) 0.083% nebulizer solution 2.5 mg  2.5 mg Nebulization Q4H PRN Howerter, Justin B, DO       Chlorhexidine  Gluconate Cloth 2 % PADS 6 each  6 each Topical Daily Howerter, Justin B, DO   6 each at 10/02/23 0920   feeding supplement (ENSURE PLUS HIGH PROTEIN) liquid 237 mL  237 mL Oral BID BM Gherghe, Costin M, MD   237 mL at 10/02/23 0920   fluticasone  (FLONASE ) 50 MCG/ACT nasal spray 2 spray  2 spray Each Nare Daily Howerter, Justin B, DO   2 spray at 10/02/23 0920   fluticasone  furoate-vilanterol (BREO ELLIPTA ) 200-25 MCG/ACT 1 puff  1 puff Inhalation Daily Vann, Jessica U, DO   1 puff at 10/02/23 0804   furosemide  (LASIX ) 120 mg in dextrose 5 % 50 mL IVPB  120 mg Intravenous TID Doil Kamara J, MD   Stopped at 10/02/23 1120   gabapentin  (NEURONTIN ) capsule 300 mg  300 mg Oral QHS Vann, Jessica U, DO   300 mg at 10/01/23 2039   heparin injection 5,000 Units  5,000 Units Subcutaneous Q8H Gherghe, Costin M, MD   5,000 Units at 10/02/23 9366   ipratropium-albuterol  (DUONEB) 0.5-2.5 (3) MG/3ML nebulizer solution 3 mL  3 mL Nebulization TID Juvenal Raisin U, DO   3 mL at 10/02/23 1408   lidocaine  (LIDODERM ) 5 % 1 patch  1 patch Transdermal Q24H Howerter, Justin B, DO   1 patch at 10/02/23 9367   melatonin tablet 3 mg  3 mg Oral QHS PRN Howerter, Justin B, DO   3 mg at 10/01/23 2216   montelukast  (SINGULAIR ) tablet 10 mg  10 mg Oral QHS Vann, Jessica U, DO  10 mg at 10/01/23 2039   naloxone  (NARCAN ) injection 0.4 mg  0.4 mg Intravenous PRN Howerter, Justin B, DO       ondansetron  (ZOFRAN ) injection 4 mg  4 mg Intravenous Q6H PRN Howerter, Justin B, DO       Oral care mouth rinse  15 mL Mouth Rinse PRN Howerter, Justin B, DO       oxyCODONE  (Oxy IR/ROXICODONE ) immediate release tablet 2.5-5 mg  2.5-5 mg Oral Q6H PRN Vann, Jessica U, DO       pantoprazole  (PROTONIX ) EC tablet 40 mg  40 mg Oral Daily Vann, Jessica U, DO   40 mg at 10/02/23 9197      Review of Systems: 10  systems reviewed and negative except per interval history/subjective  Physical Exam: Vitals:   10/02/23 0800 10/02/23 1200  BP:    Pulse:    Resp:    Temp: 98.5 F (36.9 C) 98.4 F (36.9 C)  SpO2:     Total I/O In: 480 [P.O.:480] Out: 1200 [Urine:1200]  Intake/Output Summary (Last 24 hours) at 10/02/2023 1440 Last data filed at 10/02/2023 1200 Gross per 24 hour  Intake 1112.9 ml  Output 5300 ml  Net -4187.1 ml   Constitutional: Severe obesity, lying in bed, no distress ENMT: ears and nose without scars or lesions, MMM CV: normal rate, nonpitting edema in the lower extremities Respiratory: clear to auscultation anteriorly, normal work of breathing Gastrointestinal: soft, non-tender, no palpable masses or hernias Skin: no visible lesions or rashes Psych: alert, judgement/insight appropriate, appropriate mood and affect   Test Results I personally reviewed new and old clinical labs and radiology tests Lab Results  Component Value Date   NA 134 (L) 10/02/2023   K 4.7 10/02/2023   CL 91 (L) 10/02/2023   CO2 28 10/02/2023   BUN 83 (H) 10/02/2023   CREATININE 2.98 (H) 10/02/2023   CALCIUM 9.2 10/02/2023   ALBUMIN 3.1 (L) 10/01/2023   PHOS 4.0 09/27/2023    CBC Recent Labs  Lab 09/26/23 1117 09/27/23 0102 09/29/23 0314 10/01/23 0529 10/02/23 0603  WBC 10.7* 10.5 9.6 9.3 8.3  NEUTROABS 7.2 6.7  --   --   --   HGB 11.0* 10.4* 10.1* 8.8* 9.1*  HCT 37.3 36.3 34.6* 29.6* 29.8*  MCV 98.7 99.5 98.6 95.8 94.3  PLT 277 273 255 223 219

## 2023-10-02 NOTE — Progress Notes (Signed)
   10/02/23 2153  BiPAP/CPAP/SIPAP  BiPAP/CPAP/SIPAP Pt Type Adult  BiPAP/CPAP/SIPAP SERVO (servoair)  Mask Type Full face mask  Set Rate 20 breaths/min  Respiratory Rate 22 breaths/min  IPAP 22 cmH20 (PC of 16)  PEEP 6 cmH20  FiO2 (%) 60 %  Flow Rate 0 lpm  Leak 23  Peak Inspiratory Pressure (PIP) 24  Tidal Volume (Vt) 684  Patient Home Machine No  Patient Home Mask No  Patient Home Tubing No  Auto Titrate No  Press High Alarm 25 cmH2O  Press Low Alarm 3 cmH2O  Device Plugged into RED Power Outlet Yes  Oxygen Percent 60 %  BiPAP/CPAP /SiPAP Vitals  Pulse Rate 77  Resp 18  SpO2 98 %  MEWS Score/Color  MEWS Score 0  MEWS Score Color Green   Patient was placed on bipap for at bedtime. No issues and resting with Corazon on standby

## 2023-10-02 NOTE — TOC Progression Note (Signed)
 Transition of Care [facility]) - Progression Note    Patient Details  Name: Janice Ryan MRN: [id number] Date of Birth: 12-05-1955  Transition of [facility] CM/SW Contact  Jon ONEIDA Anon, RN Phone Number: 10/02/2023, 3:01 PM  Clinical Narrative:    NCM met with pt at bedside to discuss pt going to [facility] once medically stable. Pt is in agreement with going to [facility] at discharge. NCM spoke with Ciera at [facility] and she has screened  pt and states she will start insurance auth. Awaiting approval. TOC is continuing to follow.    Expected Discharge Plan: Home w Home Health Services Barriers to Discharge: Continued Medical Work up  Expected Discharge Plan and Services In-house Referral: Clinical Social Work Discharge Planning Services: CM Consult Post Acute Care Choice: Durable Medical Equipment, Home Health Living arrangements for the past 2 months: Apartment                 DME Arranged: N/A DME Agency: [employer] Date DME Agency Contacted: 09/27/23 Time DME Agency Contacted: 1440 Representative spoke with at DME Agency: Mitch with [employer]             Social Determinants of Health (SDOH) Interventions SDOH Screenings   Food Insecurity: No Food Insecurity (09/27/2023)  Housing: Low Risk  (09/27/2023)  Transportation Needs: No Transportation Needs (09/27/2023)  Utilities: Not At Risk (09/27/2023)  Depression (PHQ2-9): Low Risk  (07/06/2021)  Social Connections: Socially Integrated (09/27/2023)  Tobacco Use: Low Risk  (09/27/2023)    Readmission Risk Interventions    09/27/2023    2:36 PM 03/06/2023   11:10 AM 02/08/2023    4:56 PM  Readmission Risk Prevention Plan  Post Dischage Appt   Complete  Medication Screening   Complete  Transportation Screening Complete Complete Complete  PCP or Specialist Appt within 5-7 Days Complete Complete   Home Care Screening Complete Complete   Medication Review (RN CM) Complete Complete

## 2023-10-02 NOTE — Plan of Care (Signed)
  Problem: Coping: Goal: Level of anxiety will decrease Outcome: Progressing   Problem: Nutrition: Goal: Adequate nutrition will be maintained Outcome: Progressing   Problem: Elimination: Goal: Will not experience complications related to urinary retention Outcome: Progressing   Problem: Health Behavior/Discharge Planning: Goal: Ability to manage health-related needs will improve Outcome: Progressing

## 2023-10-02 NOTE — Progress Notes (Addendum)
 Physical Therapy Treatment Patient Details Name: Janice Ryan MRN: [id number] DOB: May 29, 1955 Today's Date: 10/02/2023   History of Present Illness Janice Ryan is a 68 y.o. female with medical history significant for chronic hypoxic hypercapnic respiratory failure, obstructive sleep apnea, moderate persistent asthma, essential hypertension, generalized anxiety disorder, allergic rhinitis, who is admitted to [facility] on 09/26/2023 with acute on chronic hypoxic hypercapnic respiratory failure after presenting from home to [facility] ED complaining of right hip pain.    PT Comments   Pt admitted with above diagnosis.  Pt currently with functional limitations due to the deficits listed below (see PT Problem List). Pt in bed when therapist arrived. Pt lethargic and required cues throughout intervention for attention and participation. Brother present. Pt required increased physical assist for supine <> sit, sitting balance EOB mod to max A with anterior pelvic tilt and B UE and LE involuntary mm jerking movements once EOB. PT and OT deemed unsafe to progress with transfers or sit to stand  at this time with pt set up with R KI donned. Pt o2 saturation fluctuated during therapy session from 80-97% accuracy may be impacted with pt involuntary mm movements and tight grasp on bed rail. Pt Bp at rest 113/42 and s/p 103/52. Pt left in bed, all needs in place and brother present. Pt is aware of therapy recommendation for short term rehab following hospital d/c and pt in agreement. Pt will benefit from acute skilled PT to increase their independence and safety with mobility to allow discharge.    If plan is discharge home, recommend the following: Two people to help with walking and/or transfers;A lot of help with bathing/dressing/bathroom;Assistance with cooking/housework;Assist for transportation;Help with stairs or ramp for entrance   Can travel by private vehicle     No  Equipment  Recommendations  Rolling walker (2 wheels) (bari RW)    Recommendations for Other Services       Precautions / Restrictions Precautions Precautions: Fall Required Braces or Orthoses: Knee Immobilizer - Right Knee Immobilizer - Right: On when out of bed or walking Restrictions Weight Bearing Restrictions Per Provider Order: No     Mobility  Bed Mobility Overal bed mobility: Needs Assistance Bed Mobility: Supine to Sit, Sit to Supine     Supine to sit: HOB elevated, Used rails, Mod assist, +2 for physical assistance, +2 for safety/equipment Sit to supine: HOB elevated, Used rails, +2 for physical assistance, +2 for safety/equipment, Max assist   General bed mobility comments: pt continues to require increased physical assist A x 2 for safety, B UE and LE involuntary mm jerking movements when seated EOB impacting safety and progression toward sit to stand, total A to don/doff R KI in supine    Transfers                   General transfer comment: NT due to change in medical condition    Ambulation/Gait                   Stairs             Wheelchair Mobility     Tilt Bed    Modified Rankin (Stroke Patients Only)       Balance Overall balance assessment: History of Falls, Needs assistance Sitting-balance support: Feet supported, Bilateral upper extremity supported Sitting balance-Leahy Scale: Poor  Communication Communication Communication: No apparent difficulties  Cognition Arousal: Alert Behavior During Therapy: WFL for tasks assessed/performed   PT - Cognitive impairments: No apparent impairments                         Following commands: Intact      Cueing Cueing Techniques: Verbal cues  Exercises      General Comments General comments (skin integrity, edema, etc.): 8L HFO2 via [place], cues for breathing integration especially during mobility      Pertinent  Vitals/Pain Pain Assessment Pain Assessment: No/denies pain Faces Pain Scale: Hurts little more Pain Location: R knee Pain Descriptors / Indicators: Aching, Constant, Contraction, Discomfort, Grimacing, Moaning Pain Intervention(s): Limited activity within patient's tolerance, Monitored during session, Premedicated before session, Repositioned    Home Living                          Prior Function            PT Goals (current goals can now be found in the care plan section) Acute Rehab PT Goals Patient Stated Goal: to be able to get stronger, loose weight and have R knee surgery PT Goal Formulation: With patient Time For Goal Achievement: 10/11/23 Potential to Achieve Goals: Good Progress towards PT goals: Not progressing toward goals - comment    Frequency    Min 2X/week      PT Plan      Co-evaluation PT/OT/SLP Co-Evaluation/Treatment: Yes Reason for Co-Treatment: Complexity of the patient's impairments (multi-system involvement);Necessary to address cognition/behavior during functional activity;For patient/therapist safety PT goals addressed during session: Mobility/safety with mobility;Balance;Proper use of DME OT goals addressed during session: ADL's and self-care;Proper use of Adaptive equipment and DME      AM-PAC PT 6 Clicks Mobility   Outcome Measure  Help needed turning from your back to your side while in a flat bed without using bedrails?: A Lot Help needed moving from lying on your back to sitting on the side of a flat bed without using bedrails?: A Lot Help needed moving to and from a bed to a chair (including a wheelchair)?: Total Help needed standing up from a chair using your arms (e.g., wheelchair or bedside chair)?: Total Help needed to walk in hospital room?: Total Help needed climbing 3-5 steps with a railing? : Total 6 Click Score: 8    End of Session Equipment Utilized During Treatment: Oxygen Activity Tolerance: Patient limited  by fatigue;Other (comment) (SOB) Patient left: in bed;with call bell/phone within reach;with family/visitor present Nurse Communication: Mobility status;Other (comment) (O2 and change in medical and slight change in cogniative/lethargy presentation since intervention 6/30 impacitng pt progression) PT Visit Diagnosis: Unsteadiness on feet (R26.81);Other abnormalities of gait and mobility (R26.89);Repeated falls (R29.6);Muscle weakness (generalized) (M62.81);Difficulty in walking, not elsewhere classified (R26.2);Pain Pain - Right/Left: Right Pain - part of body: Knee;Leg     Time: 8942-8883 PT Time Calculation (min) (ACUTE ONLY): 19 min  Charges:    $Therapeutic Activity: 8-22 mins PT General Charges $$ ACUTE PT VISIT: 1 Visit                     [place], PT Acute Rehab    [place] VEAR Drone 10/02/2023, 12:03 PM

## 2023-10-03 DIAGNOSIS — J9622 Acute and chronic respiratory failure with hypercapnia: Secondary | ICD-10-CM | POA: Diagnosis not present

## 2023-10-03 LAB — BASIC METABOLIC PANEL WITH GFR
Anion gap: 12 (ref 5–15)
BUN: 89 mg/dL — ABNORMAL HIGH (ref 8–23)
CO2: 30 mmol/L (ref 22–32)
Calcium: 9 mg/dL (ref 8.9–10.3)
Chloride: 92 mmol/L — ABNORMAL LOW (ref 98–111)
Creatinine, Ser: 2.41 mg/dL — ABNORMAL HIGH (ref 0.44–1.00)
GFR, Estimated: 22 mL/min — ABNORMAL LOW (ref >60)
Glucose, Bld: 112 mg/dL — ABNORMAL HIGH (ref 70–99)
Potassium: 4.6 mmol/L (ref 3.5–5.1)
Sodium: 134 mmol/L — ABNORMAL LOW (ref 135–145)

## 2023-10-03 LAB — CBC
HCT: 28.9 % — ABNORMAL LOW (ref 36.0–46.0)
Hemoglobin: 9 g/dL — ABNORMAL LOW (ref 12.0–15.0)
MCH: 28.8 pg (ref 26.0–34.0)
MCHC: 31.1 g/dL (ref 30.0–36.0)
MCV: 92.6 fL (ref 80.0–100.0)
Platelets: 226 10*3/uL (ref 150–400)
RBC: 3.12 MIL/uL — ABNORMAL LOW (ref 3.87–5.11)
RDW: 12.7 % (ref 11.5–15.5)
WBC: 8.5 10*3/uL (ref 4.0–10.5)
nRBC: 0 % (ref 0.0–0.2)

## 2023-10-03 MED ORDER — TRAZODONE HCL 50 MG PO TABS
25.0000 mg | ORAL_TABLET | Freq: Once | ORAL | Status: AC
  Administered 2023-10-03: 25 mg via ORAL
  Filled 2023-10-03: qty 1

## 2023-10-03 MED ORDER — DICLOFENAC SODIUM 1 % EX GEL
2.0000 g | Freq: Four times a day (QID) | CUTANEOUS | Status: DC
  Administered 2023-10-03 – 2023-10-09 (×16): 2 g via TOPICAL
  Filled 2023-10-03 (×2): qty 100

## 2023-10-03 NOTE — Progress Notes (Signed)
 Nephrology Follow-Up Consult note   Assessment/Recommendations: Janice Ryan is a/an 68 y.o. female with a past medical history significant for chronic hypoxic hypercapnic respiratory failure wears 5 L at home, OSA, asthma, HTN, severe obesity, admitted for right hip pain complicated by AKI and acute worsening respiratory failure.       Severe non-oliguric AKI: Baseline creatinine normal.  Urine sodium less than 10.  May have had some acute tubular injury but given low urine sodium other etiologies more likely.  Given volume overload prerenal azotemia less likely.  More likely some cardiorenal physiology despite normal ejection fraction recently.  Hepatorenal physiology could present in a similar fashion but given the overall clinical picture seems less likely.  Creatinine now improving with great urine output.  Continue with current management.  Given improving creatinine and good urine output we will sign off at this time.  BUN is risen with volume optimization.  May need to hold diuretics a day to allow for equilibration of her volume status given aggressive diuresis.  However, no uremia at this time Hyperkalemia: Now improved Acute on chronic hypoxic/ hypercapneic resp failure: Multifactorial.  Continue with IV Lasix .  Seems to be improving.  Likely has a lot of volume overload.  Hard to assess given body habitus Morbid obesity  Hyponatremia: Likely related to volume excess.  Has improved with volume optimization.   Recommendations conveyed to primary service.    Jayson JINNY Player [facility] 10/03/2023 3:10 PM  ___________________________________________________________  CC: Hip pain  Interval History/Subjective: Patient feels well today with no complaints.  Continues to have very good urine output.  Creatinine continues to downtrend.   Medications:  Current Facility-Administered Medications  Medication Dose Route Frequency Provider Last Rate Last Admin    acetaminophen  (TYLENOL ) tablet 650 mg  650 mg Oral Q6H PRN Howerter, Justin B, DO   650 mg at 10/03/23 9182   Or   acetaminophen  (TYLENOL ) suppository 650 mg  650 mg Rectal Q6H PRN Howerter, Justin B, DO       albuterol  (PROVENTIL ) (2.5 MG/3ML) 0.083% nebulizer solution 2.5 mg  2.5 mg Nebulization Q4H PRN Howerter, Justin B, DO       Chlorhexidine  Gluconate Cloth 2 % PADS 6 each  6 each Topical Daily Howerter, Justin B, DO   6 each at 10/02/23 0920   feeding supplement (ENSURE PLUS HIGH PROTEIN) liquid 237 mL  237 mL Oral BID BM Gherghe, Costin M, MD   237 mL at 10/02/23 1432   fluticasone  (FLONASE ) 50 MCG/ACT nasal spray 2 spray  2 spray Each Nare Daily Howerter, Justin B, DO   2 spray at 10/02/23 0920   fluticasone  furoate-vilanterol (BREO ELLIPTA ) 200-25 MCG/ACT 1 puff  1 puff Inhalation Daily Vann, Jessica U, DO   1 puff at 10/03/23 0749   furosemide  (LASIX ) 120 mg in dextrose 5 % 50 mL IVPB  120 mg Intravenous TID Tandre Conly J, MD   Stopped at 10/03/23 1055   gabapentin  (NEURONTIN ) capsule 300 mg  300 mg Oral QHS Vann, Jessica U, DO   300 mg at 10/02/23 2032   heparin injection 5,000 Units  5,000 Units Subcutaneous Q8H Gherghe, Costin M, MD   5,000 Units at 10/03/23 1357   ipratropium-albuterol  (DUONEB) 0.5-2.5 (3) MG/3ML nebulizer solution 3 mL  3 mL Nebulization TID Vann, Jessica U, DO   3 mL at 10/03/23 1418   lidocaine  (LIDODERM ) 5 % 1 patch  1 patch Transdermal Q24H Howerter, Justin B, DO   1 patch  at 10/03/23 9364   melatonin tablet 3 mg  3 mg Oral QHS PRN Howerter, Justin B, DO   3 mg at 10/03/23 0023   montelukast  (SINGULAIR ) tablet 10 mg  10 mg Oral QHS Vann, Jessica U, DO   10 mg at 10/02/23 2032   naloxone  (NARCAN ) injection 0.4 mg  0.4 mg Intravenous PRN Howerter, Justin B, DO       ondansetron  (ZOFRAN ) injection 4 mg  4 mg Intravenous Q6H PRN Howerter, Justin B, DO       Oral care mouth rinse  15 mL Mouth Rinse PRN Howerter, Justin B, DO       oxyCODONE  (Oxy IR/ROXICODONE )  immediate release tablet 2.5-5 mg  2.5-5 mg Oral Q6H PRN Vann, Jessica U, DO       pantoprazole  (PROTONIX ) EC tablet 40 mg  40 mg Oral Daily Vann, Jessica U, DO   40 mg at 10/03/23 0813      Review of Systems: 10 systems reviewed and negative except per interval history/subjective  Physical Exam: Vitals:   10/03/23 1200 10/03/23 1313  BP: (!) 118/42   Pulse: 72 74  Resp: 20 19  Temp: 98.8 F (37.1 C)   SpO2: 91% 92%   Total I/O In: 1001.6 [P.O.:840; IV Piggyback:161.6] Out: 600 [Urine:600]  Intake/Output Summary (Last 24 hours) at 10/03/2023 1510 Last data filed at 10/03/2023 1444 Gross per 24 hour  Intake 1851.55 ml  Output 4150 ml  Net -2298.45 ml   Constitutional: Severe obesity, lying in bed, no distress ENMT: ears and nose without scars or lesions, MMM CV: normal rate, nonpitting edema in the lower extremities Respiratory: Bilateral chest rise, normal work of breathing Gastrointestinal: soft, non-tender, no palpable masses or hernias Skin: no visible lesions or rashes Psych: alert, judgement/insight appropriate, appropriate mood and affect   Test Results I personally reviewed new and old clinical labs and radiology tests Lab Results  Component Value Date   NA 134 (L) 10/03/2023   K 4.6 10/03/2023   CL 92 (L) 10/03/2023   CO2 30 10/03/2023   BUN 89 (H) 10/03/2023   CREATININE 2.41 (H) 10/03/2023   CALCIUM 9.0 10/03/2023   ALBUMIN 3.1 (L) 10/01/2023   PHOS 4.0 09/27/2023    CBC Recent Labs  Lab 09/27/23 0102 09/29/23 0314 10/01/23 0529 10/02/23 0603 10/03/23 0258  WBC 10.5   < > 9.3 8.3 8.5  NEUTROABS 6.7  --   --   --   --   HGB 10.4*   < > 8.8* 9.1* 9.0*  HCT 36.3   < > 29.6* 29.8* 28.9*  MCV 99.5   < > 95.8 94.3 92.6  PLT 273   < > 223 219 226   < > = values in this interval not displayed.

## 2023-10-03 NOTE — Progress Notes (Signed)
 Visit made to patients room to assist with BIPAP.  Pt states she is not ready at this time.

## 2023-10-03 NOTE — Progress Notes (Addendum)
 PROGRESS NOTE    Janice Ryan  FMW:[id number] DOB: 10-06-1955 DOA: 09/26/2023 PCP: Lorren Greig PARAS, NP   Brief Narrative:  68 y.o. female with medical history significant for chronic hypoxic hypercapnic respiratory failure-wears 5 L at home, obstructive sleep apnea, moderate persistent asthma, essential hypertension, generalized anxiety disorder, allergic rhinitis, who is admitted to [facility] on 09/26/2023 with acute on chronic hypoxic hypercapnic respiratory failure after presenting from home to [facility] ED complaining of right hip pain.  This happened after tripping and falling at home on 6/24.  Following the fall she had right hip discomfort radiating into the right groin every time she attempted to bear weight.  Hospital stay complicated by worsening kidney function due to volume overload   Assessment & Plan:   Principal Problem:   Acute on chronic respiratory failure with hypercapnia (HCC) Active Problems:   Essential hypertension   Moderate persistent asthma   GAD (generalized anxiety disorder)   Morbid obesity (HCC)   Acute right hip pain   Low back pain   Acute encephalopathy   Allergic rhinitis  Acute on chronic hypoxic and hypercarbic respiratory failure due to combination of obesity hypoventilation syndrome and acute on chronic congestive heart failure with preserved ejection fraction-initial chest x-ray completely normal however due to AKI, she was given fluids which caused volume overload/acute on chronic congestive heart failure with preserved ejection fraction as echo showed normal EF.  She was then started on diuresis.  Appears to be using 5 L of oxygen at baseline at home.  Currently on 6 L of high flow oxygen.  Has had 4.5 L of diuresis over the last 24 hours alone.  Will continue on high dose of IV Lasix  3 times daily as she is right now.  Nephrology is following.   Acute kidney injury -patient's creatinine on presentation was 1.2.  She has some intermittent  low blood pressure reads but she is normotensive now for most part.  Patient's creatinine started to climb and peaked at 4.2 on 10/01/2023.  Nephrology consulted, started on diuresis, creatinine improving and down to 2.41 today.  Brisk urine output in last 24 hours.   Hyperkalemia-resolved after Lokelma .  Was secondary to AKI.  Mild hyponatremia: Improving, 134 today.   Acute toxic/metabolic encephalopathy-became somnolent after multiple doses of Dilaudid  and Ativan  in the ED.  Now resolved -avoid IV narcotics   Right hip pain-imaging with CT did not show any evidence of fractures.  Pain control, physical therapy  Chronic normocytic anemia: Hemoglobin stable.   Chronic low back pain -slightly worse now after the fall.  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  Continue supportive care   Morbid obesity class III-BMI 63.  She would benefit from weight loss and dietary modification which is counseled.  DVT prophylaxis: heparin injection 5,000 Units Start: 09/30/23 2200 SCDs Start: 09/26/23 2031   Code Status: Full Code  Family Communication:  None present at bedside.  Plan of care discussed with patient in length and he/she verbalized understanding and agreed with it.  Status is: Inpatient Remains inpatient appropriate because: Still on higher oxygen and creatinine higher than baseline.   Estimated body mass index is 63.72 kg/m as calculated from the following:   Height as of this encounter: 5' 8 (1.727 m).   Weight as of this encounter: 190.1 kg.    Nutritional Assessment: Body mass index is 63.72 kg/m.[name] Seen by dietician.  I agree with the assessment and plan as outlined below: Nutrition Status:        . Skin Assessment: I have examined the patient's skin and I agree with the wound assessment as performed by the wound care RN as outlined below:    Consultants:   Nephrology  Procedures:  As above  Antimicrobials:  Anti-infectives (From admission, onward)    None         Subjective: Patient seen and examined, she feels better.  Denies any shortness of breath or ankle complaint.  Objective: Vitals:   10/03/23 0500 10/03/23 0600 10/03/23 0713 10/03/23 0811  BP: (!) 126/55 (!) 145/45    Pulse: 77 77 79   Resp: 14 15 20    Temp:    98.9 F (37.2 C)  TempSrc:    Oral  SpO2: 92% 92% (!) 88%   Weight:      Height:        Intake/Output Summary (Last 24 hours) at 10/03/2023 0815 Last data filed at 10/03/2023 0813 Gross per 24 hour  Intake 1669.55 ml  Output 3550 ml  Net -1880.45 ml   Filed Weights   09/29/23 0600 09/30/23 0500 10/01/23 0500  Weight: (!) 186 kg (!) 190.1 kg (!) 190.1 kg    Examination:  General exam: Appears calm and comfortable, morbidly obese Respiratory system: Bibasilar crackles. Respiratory effort normal. Cardiovascular system: S1 & S2 heard, RRR. No JVD, murmurs, rubs, gallops or clicks.  +3 pitting edema bilateral lower extremity Gastrointestinal system: Abdomen is nondistended, soft and nontender. No organomegaly or masses felt. Normal bowel sounds heard. Central nervous system: Alert and oriented. No focal neurological deficits. Extremities: Symmetric 5 x 5 power. Skin: No rashes, lesions or ulcers Psychiatry: Judgement and insight appear normal. Mood & affect appropriate.    Data Reviewed: I have personally reviewed following labs and imaging studies  CBC: Recent Labs  Lab 09/26/23 1117 09/27/23 0102 09/29/23 0314 10/01/23 0529 10/02/23 0603 10/03/23 0258  WBC 10.7* 10.5 9.6 9.3 8.3 8.5  NEUTROABS 7.2 6.7  --   --   --   --   HGB 11.0* 10.4* 10.1* 8.8* 9.1* 9.0*  HCT 37.3 36.3 34.6* 29.6* 29.8* 28.9*  MCV 98.7 99.5 98.6 95.8 94.3 92.6  PLT 277 273 255 223 219 226   Basic Metabolic Panel: Recent Labs  Lab 09/27/23 0102 09/29/23 0314 09/29/23 1828 09/30/23 0315 10/01/23 0336  10/02/23 0603 10/03/23 0258  NA 139   < > 130* 128* 128* 134* 134*  K 4.7   < > 5.0 5.8* 5.5* 4.7 4.6  CL 94*   < > 90* 89* 91* 91* 92*  CO2 36*   < > 29 30 25 28 30   GLUCOSE 116*   < > 115* 105* 109* 101* 112*  BUN 16   < > 54* 58* 72* 83* 89*  CREATININE 1.26*   < > 3.41* 4.10* 4.20* 2.98* 2.41*  CALCIUM 9.6   < > 8.8* 8.7* 8.3* 9.2 9.0  MG 2.2  --   --   --  2.7*  --   --   PHOS 4.0  --   --   --   --   --   --    < > = values in this interval not displayed.   GFR: Estimated Creatinine Clearance: 41.5 mL/min (A) (by C-G formula based on SCr of 2.41 mg/dL (H)). Liver Function Tests: Recent Labs  Lab 09/26/23 1117 09/27/23 0102 09/30/23 0315 10/01/23 9663  AST 12* 10* 9* 10*  ALT 15 15 12 10   ALKPHOS 82 79 79 81  BILITOT 0.7 0.6 0.7 0.6  PROT 6.9 6.7 6.8 6.4*  ALBUMIN 3.5 3.3* 3.2* 3.1*   No results for input(s): LIPASE, AMYLASE in the last 168 hours. No results for input(s): AMMONIA in the last 168 hours. Coagulation Profile: No results for input(s): INR, PROTIME in the last 168 hours. Cardiac Enzymes: No results for input(s): CKTOTAL, CKMB, CKMBINDEX, TROPONINI in the last 168 hours. BNP (last 3 results) No results for input(s): PROBNP in the last 8760 hours. HbA1C: No results for input(s): HGBA1C in the last 72 hours. CBG: No results for input(s): GLUCAP in the last 168 hours. Lipid Profile: No results for input(s): CHOL, HDL, LDLCALC, TRIG, CHOLHDL, LDLDIRECT in the last 72 hours. Thyroid Function Tests: No results for input(s): TSH, T4TOTAL, FREET4, T3FREE, THYROIDAB in the last 72 hours. Anemia Panel: No results for input(s): VITAMINB12, FOLATE, FERRITIN, TIBC, IRON, RETICCTPCT in the last 72 hours. Sepsis Labs: Recent Labs  Lab 09/27/23 0104  PROCALCITON <0.10    No results found for this or any previous visit (from the past 240 hours).   Radiology Studies: No results found.  Scheduled  Meds:  Chlorhexidine  Gluconate Cloth  6 each Topical Daily   feeding supplement  237 mL Oral BID BM   fluticasone   2 spray Each Nare Daily   fluticasone  furoate-vilanterol  1 puff Inhalation Daily   gabapentin   300 mg Oral QHS   heparin injection (subcutaneous)  5,000 Units Subcutaneous Q8H   ipratropium-albuterol   3 mL Nebulization TID   lidocaine   1 patch Transdermal Q24H   montelukast   10 mg Oral QHS   pantoprazole   40 mg Oral Daily   Continuous Infusions:  furosemide  Stopped (10/02/23 1730)     LOS: 7 days   Fredia Skeeter, MD [facility]  10/03/2023, 8:15 AM   *Please note that this is a verbal dictation therefore any spelling or grammatical errors are due to the Dragon Medical One system interpretation.  Please page via Amion and do not message via secure chat for urgent patient care matters. Secure chat can be used for non urgent patient care matters.  How to contact the [facility] Attending or Consulting provider 7A - 7P or covering provider during after hours 7P -7A, for this patient?  Check the care team in [facility] and look for a) attending/consulting [facility] provider listed and b) the [facility] team listed. Page or secure chat 7A-7P. Log into [email or web address] and use [place]'s universal password to access. If you do not have the password, please contact the hospital operator. Locate the [facility] provider you are looking for under [facility] and page to a number that you can be directly reached. If you still have difficulty reaching the provider, please page the [facility] (Director on Call) for the Hospitalists listed on amion for assistance.

## 2023-10-04 DIAGNOSIS — J9622 Acute and chronic respiratory failure with hypercapnia: Secondary | ICD-10-CM | POA: Diagnosis not present

## 2023-10-04 LAB — BASIC METABOLIC PANEL WITH GFR
Anion gap: 14 (ref 5–15)
BUN: 93 mg/dL — ABNORMAL HIGH (ref 8–23)
CO2: 29 mmol/L (ref 22–32)
Calcium: 9 mg/dL (ref 8.9–10.3)
Chloride: 90 mmol/L — ABNORMAL LOW (ref 98–111)
Creatinine, Ser: 2.6 mg/dL — ABNORMAL HIGH (ref 0.44–1.00)
GFR, Estimated: 20 mL/min — ABNORMAL LOW (ref >60)
Glucose, Bld: 109 mg/dL — ABNORMAL HIGH (ref 70–99)
Potassium: 4.1 mmol/L (ref 3.5–5.1)
Sodium: 133 mmol/L — ABNORMAL LOW (ref 135–145)

## 2023-10-04 LAB — CBC WITH DIFFERENTIAL/PLATELET
Abs Immature Granulocytes: 0.06 10*3/uL (ref 0.00–0.07)
Basophils Absolute: 0 10*3/uL (ref 0.0–0.1)
Basophils Relative: 0 %
Eosinophils Absolute: 0.2 10*3/uL (ref 0.0–0.5)
Eosinophils Relative: 2 %
HCT: 29.3 % — ABNORMAL LOW (ref 36.0–46.0)
Hemoglobin: 9 g/dL — ABNORMAL LOW (ref 12.0–15.0)
Immature Granulocytes: 1 %
Lymphocytes Relative: 25 %
Lymphs Abs: 1.9 10*3/uL (ref 0.7–4.0)
MCH: 28.4 pg (ref 26.0–34.0)
MCHC: 30.7 g/dL (ref 30.0–36.0)
MCV: 92.4 fL (ref 80.0–100.0)
Monocytes Absolute: 0.8 10*3/uL (ref 0.1–1.0)
Monocytes Relative: 10 %
Neutro Abs: 4.8 10*3/uL (ref 1.7–7.7)
Neutrophils Relative %: 62 %
Platelets: 227 10*3/uL (ref 150–400)
RBC: 3.17 MIL/uL — ABNORMAL LOW (ref 3.87–5.11)
RDW: 12.6 % (ref 11.5–15.5)
WBC: 7.8 10*3/uL (ref 4.0–10.5)
nRBC: 0 % (ref 0.0–0.2)

## 2023-10-04 MED ORDER — BUSPIRONE HCL 5 MG PO TABS
15.0000 mg | ORAL_TABLET | Freq: Three times a day (TID) | ORAL | Status: DC
  Administered 2023-10-04 – 2023-10-09 (×16): 15 mg via ORAL
  Filled 2023-10-04 (×16): qty 1

## 2023-10-04 MED ORDER — MIRTAZAPINE 15 MG PO TABS
15.0000 mg | ORAL_TABLET | Freq: Every day | ORAL | Status: DC
  Administered 2023-10-04 – 2023-10-08 (×5): 15 mg via ORAL
  Filled 2023-10-04 (×5): qty 1

## 2023-10-04 MED ORDER — ORAL CARE MOUTH RINSE: 15.0000 mL | OROMUCOSAL | Status: DC | PRN

## 2023-10-04 MED ORDER — DOXEPIN HCL 25 MG PO CAPS
50.0000 mg | ORAL_CAPSULE | Freq: Every day | ORAL | Status: DC
  Administered 2023-10-04 – 2023-10-09 (×6): 50 mg via ORAL
  Filled 2023-10-04: qty 1
  Filled 2023-10-04 (×4): qty 2
  Filled 2023-10-04: qty 1
  Filled 2023-10-04 (×2): qty 2

## 2023-10-04 MED ORDER — TRAZODONE HCL 50 MG PO TABS
25.0000 mg | ORAL_TABLET | Freq: Every evening | ORAL | Status: AC | PRN
  Administered 2023-10-04 – 2023-10-06 (×3): 25 mg via ORAL
  Filled 2023-10-04 (×3): qty 1

## 2023-10-04 MED ORDER — DOCUSATE SODIUM 100 MG PO CAPS: 100.0000 mg | ORAL_CAPSULE | Freq: Every day | ORAL | Status: DC | PRN

## 2023-10-04 NOTE — Plan of Care (Signed)
  Problem: Clinical Measurements: Goal: Cardiovascular complication will be avoided Outcome: Progressing   Problem: Elimination: Goal: Will not experience complications related to bowel motility Outcome: Progressing Goal: Will not experience complications related to urinary retention Outcome: Progressing   Problem: Pain Managment: Goal: General experience of comfort will improve and/or be controlled Outcome: Progressing   Problem: Safety: Goal: Ability to remain free from injury will improve Outcome: Progressing

## 2023-10-04 NOTE — TOC Progression Note (Signed)
 Transition of Care [facility]) - Progression Note    Patient Details  Name: Janice Ryan MRN: [id number] Date of Birth: December 26, 1955  [facility] CM/SW Contact  Jon ONEIDA Anon, RN Phone Number: 10/04/2023, 4:10 PM  Clinical Narrative:    [facility] started insurance auth, went Peer to Peer and was denied. Ciera with [facility] has started an appeal, we will await a decision. May need to send out referrals for SNF placement. Pt is agreeable to STR at SNF. TOC is following.   Expected Discharge Plan: Home w Home Health Services Barriers to Discharge: Continued Medical Work up  Expected Discharge Plan and Services In-house Referral: Clinical Social Work Discharge Planning Services: CM Consult Post Acute Care Choice: Durable Medical Equipment, Home Health Living arrangements for the past 2 months: Apartment                 DME Arranged: N/A DME Agency: [employer] Date DME Agency Contacted: 09/27/23 Time DME Agency Contacted: 1440 Representative spoke with at DME Agency: Mitch with [employer]             Social Determinants of Health (SDOH) Interventions SDOH Screenings   Food Insecurity: No Food Insecurity (09/27/2023)  Housing: Low Risk  (09/27/2023)  Transportation Needs: No Transportation Needs (09/27/2023)  Utilities: Not At Risk (09/27/2023)  Depression (PHQ2-9): Low Risk  (07/06/2021)  Social Connections: Socially Integrated (09/27/2023)  Tobacco Use: Low Risk  (09/27/2023)    Readmission Risk Interventions    09/27/2023    2:36 PM 03/06/2023   11:10 AM 02/08/2023    4:56 PM  Readmission Risk Prevention Plan  Post Dischage Appt   Complete  Medication Screening   Complete  Transportation Screening Complete Complete Complete  PCP or Specialist Appt within 5-7 Days Complete Complete   Home Care Screening Complete Complete   Medication Review (RN CM) Complete Complete

## 2023-10-04 NOTE — Plan of Care (Signed)

## 2023-10-04 NOTE — Progress Notes (Signed)
 OT Cancellation Note  Patient Details Name: Janice Ryan MRN: [id number] DOB: October 23, 1955   Cancelled Treatment:    Reason Eval/Treat Not Completed: Fatigue/lethargy limiting ability to participate OT attempted session, patient asleep requesting later visit. OT will re-attempt as schedule allows.   Stevee Valenta OT/L Acute Rehabilitation Department  [phone]    10/04/2023, 12:26 PM

## 2023-10-04 NOTE — Progress Notes (Signed)
 PROGRESS NOTE    Janice Ryan  FMW:[id number] DOB: 1955-09-20 DOA: 09/26/2023 PCP: Lorren Greig PARAS, NP   Brief Narrative:  68 y.o. female with medical history significant for chronic hypoxic hypercapnic respiratory failure-wears 5 L at home, obstructive sleep apnea, moderate persistent asthma, essential hypertension, generalized anxiety disorder, allergic rhinitis, who is admitted to [facility] on 09/26/2023 with acute on chronic hypoxic hypercapnic respiratory failure after presenting from home to [facility] ED complaining of right hip pain.  This happened after tripping and falling at home on 6/24.  Following the fall she had right hip discomfort radiating into the right groin every time she attempted to bear weight.  Hospital stay complicated by worsening kidney function due to volume overload   Assessment & Plan:   Principal Problem:   Acute on chronic respiratory failure with hypercapnia (HCC) Active Problems:   Essential hypertension   Moderate persistent asthma   GAD (generalized anxiety disorder)   Morbid obesity (HCC)   Acute right hip pain   Low back pain   Acute encephalopathy   Allergic rhinitis  Acute on chronic hypoxic and hypercarbic respiratory failure due to combination of obesity hypoventilation syndrome and acute on chronic congestive heart failure with preserved ejection fraction-initial chest x-ray completely normal however due to AKI, she was given fluids which caused volume overload/acute on chronic congestive heart failure with preserved ejection fraction as echo showed normal EF.  She was then started on diuresis.  Appears to be using 5 L of oxygen at baseline at home.  Currently on 6 L of high flow oxygen.  Has had another 3.2 L of diuresis in last 24 hours.  BUN as well as creatinine both slightly up today.  Nephrology saw yesterday and signed off.  They suggested holding off to diuresis for 1 day due to rising BUN.  Now that creatinine is also rising so we  will discontinue Lasix  today.  Repeat labs in the morning and manage accordingly.   Acute kidney injury -patient's creatinine on presentation was 1.2.  She has some intermittent low blood pressure reads but she is normotensive now for most part.  Patient's creatinine started to climb and peaked at 4.2 on 10/01/2023.  Nephrology consulted, started on diuresis, creatinine improving continue to improve and down to 2.4 yesterday but up today to 2.6.  As mentioned above, holding diuretics today.  Reassess tomorrow.   Hyperkalemia-resolved after Lokelma .  Was secondary to AKI.  Mild hyponatremia: Improving, 134 today.   Acute toxic/metabolic encephalopathy-became somnolent after multiple doses of Dilaudid  and Ativan  in the ED.  Now resolved -avoid IV narcotics   Right hip pain-imaging with CT did not show any evidence of fractures.  Pain control, physical therapy  Chronic normocytic anemia: Hemoglobin stable.   Chronic low back pain -slightly worse now after the fall.  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  Continue supportive care   Morbid obesity class III-BMI 63.  She would benefit from weight loss and dietary modification which is counseled.  Depression/anxiety: Patient upset about not getting her medications for these for last 10 days.  She thinks that she is now having withdrawal although appears to be very calm and composed.  I have resumed all her home medications.  Disposition: PT OT recommended LTAC.  I did peer to peer with the insurance company on 10/03/2023.  LTAC was denied.  LTAC will appeal.  DVT prophylaxis: heparin  injection 5,000 Units Start: 09/30/23 2200 SCDs Start: 09/26/23 2031   Code Status: Full Code  Family Communication:  None present at bedside.  Plan of care discussed with patient in length and he/she verbalized understanding and agreed with it.  Status is:  Inpatient Remains inpatient appropriate because: Still on higher oxygen and creatinine higher than baseline.   Estimated body mass index is 65.94 kg/m as calculated from the following:   Height as of this encounter: 5' 8 (1.727 m).   Weight as of this encounter: 196.7 kg.    Nutritional Assessment: Body mass index is 65.94 kg/m.[name] Seen by dietician.  I agree with the assessment and plan as outlined below: Nutrition Status:        . Skin Assessment: I have examined the patient's skin and I agree with the wound assessment as performed by the wound care RN as outlined below:    Consultants:  Nephrology  Procedures:  As above  Antimicrobials:  Anti-infectives (From admission, onward)    None         Subjective: And examined.  She has no complaints per se but she says  I am not happy because you guys have not been giving any my anxiety and depression medications for 10 days and now I am withdrawing from that.  Unfortunately, I was not her physician so I am not sure what the reason was to hold those medications but I reassured her that I have already resumed her home medications.  She was happy about that.  She had no other complaint such as shortness of breath or chest pain.  She once again asked if we can liberate her oral fluid intake because she believes that Lasix  is making her thirsty.  I informed her that the best treatment would be to continue to restated fluid restriction but also informed her that we are going to give her a break from Lasix  today so she will likely not feel as thirsty as she felt yesterday.  She was happy about that.  Objective: Vitals:   10/04/23 0500 10/04/23 0508 10/04/23 0600 10/04/23 0700  BP:  (!) 127/56 (!) 114/48 (!) 153/67  Pulse:  71 70 67  Resp:  14 14 13   Temp:      TempSrc:      SpO2:  97% 97% 97%  Weight: (!) 196.7 kg     Height:        Intake/Output Summary (Last 24 hours) at 10/04/2023 0741 Last data filed at 10/04/2023  9353 Gross per 24 hour  Intake 1866 ml  Output 3250 ml  Net -1384 ml   Filed Weights   09/30/23 0500 10/01/23 0500 10/04/23 0500  Weight: (!) 190.1 kg (!) 190.1 kg (!) 196.7 kg    Examination:  General exam: Appears calm and comfortable, morbidly obese Respiratory system: Bibasilar crackles, faint, better than yesterday.[name] Respiratory effort normal. Cardiovascular system: S1 & S2 heard, RRR. No JVD, murmurs, rubs, gallops or clicks.  +2 pitting edema bilateral lower extremity. Gastrointestinal system: Abdomen is nondistended, soft and nontender. No organomegaly or masses felt. Normal bowel sounds heard. Central nervous system: Alert and oriented. No focal neurological deficits. Extremities: Symmetric 5 x 5 power. Skin: No rashes, lesions or ulcers.  Psychiatry: Judgement and insight appear normal. Mood & affect appropriate.   Data Reviewed: I have personally reviewed following labs and imaging studies  CBC: Recent Labs  Lab 09/29/23 0314 10/01/23 0529 10/02/23 0603 10/03/23 0258 10/04/23 0250  WBC 9.6 9.3 8.3  8.5 7.8  NEUTROABS  --   --   --   --  4.8  HGB 10.1* 8.8* 9.1* 9.0* 9.0*  HCT 34.6* 29.6* 29.8* 28.9* 29.3*  MCV 98.6 95.8 94.3 92.6 92.4  PLT 255 223 219 226 227   Basic Metabolic Panel: Recent Labs  Lab 09/30/23 0315 10/01/23 0336 10/02/23 0603 10/03/23 0258 10/04/23 0250  NA 128* 128* 134* 134* 133*  K 5.8* 5.5* 4.7 4.6 4.1  CL 89* 91* 91* 92* 90*  CO2 30 25 28 30 29   GLUCOSE 105* 109* 101* 112* 109*  BUN 58* 72* 83* 89* 93*  CREATININE 4.10* 4.20* 2.98* 2.41* 2.60*  CALCIUM 8.7* 8.3* 9.2 9.0 9.0  MG  --  2.7*  --   --   --    GFR: Estimated Creatinine Clearance: 39.3 mL/min (A) (by C-G formula based on SCr of 2.6 mg/dL (H)). Liver Function Tests: Recent Labs  Lab 09/30/23 0315 10/01/23 0336  AST 9* 10*  ALT 12 10  ALKPHOS 79 81  BILITOT 0.7 0.6  PROT 6.8 6.4*  ALBUMIN 3.2* 3.1*   No results for input(s): LIPASE, AMYLASE in the last  168 hours. No results for input(s): AMMONIA in the last 168 hours. Coagulation Profile: No results for input(s): INR, PROTIME in the last 168 hours. Cardiac Enzymes: No results for input(s): CKTOTAL, CKMB, CKMBINDEX, TROPONINI in the last 168 hours. BNP (last 3 results) No results for input(s): PROBNP in the last 8760 hours. HbA1C: No results for input(s): HGBA1C in the last 72 hours. CBG: No results for input(s): GLUCAP in the last 168 hours. Lipid Profile: No results for input(s): CHOL, HDL, LDLCALC, TRIG, CHOLHDL, LDLDIRECT in the last 72 hours. Thyroid Function Tests: No results for input(s): TSH, T4TOTAL, FREET4, T3FREE, THYROIDAB in the last 72 hours. Anemia Panel: No results for input(s): VITAMINB12, FOLATE, FERRITIN, TIBC, IRON, RETICCTPCT in the last 72 hours. Sepsis Labs: No results for input(s): PROCALCITON, LATICACIDVEN in the last 168 hours.   No results found for this or any previous visit (from the past 240 hours).   Radiology Studies: No results found.  Scheduled Meds:  Chlorhexidine  Gluconate Cloth  6 each Topical Daily   diclofenac  Sodium  2 g Topical QID   feeding supplement  237 mL Oral BID BM   fluticasone   2 spray Each Nare Daily   fluticasone  furoate-vilanterol  1 puff Inhalation Daily   gabapentin   300 mg Oral QHS   heparin injection (subcutaneous)  5,000 Units Subcutaneous Q8H   ipratropium-albuterol   3 mL Nebulization TID   lidocaine   1 patch Transdermal Q24H   montelukast   10 mg Oral QHS   pantoprazole   40 mg Oral Daily   Continuous Infusions:     LOS: 8 days   Fredia Skeeter, MD [facility]  10/04/2023, 7:41 AM   *Please note that this is a verbal dictation therefore any spelling or grammatical errors are due to the Dragon Medical One system interpretation.  Please page via Amion and do not message via secure chat for urgent patient care matters. Secure chat can be used for  non urgent patient care matters.  How to contact the TRH Attending or Consulting provider 7A - 7P or covering provider during after hours 7P -7A, for this patient?  Check the care team in [facility] and look for a) attending/consulting TRH provider listed and b) the TRH team listed. Page or secure chat 7A-7P. Log into [email or web address] and use [facility]'s universal password to access.  If you do not have the password, please contact the hospital operator. Locate the TR[facility] provider you are looking for under [facility] and page to a number that you can be directly reached. If you still have difficulty reaching the provider, please page the [facility] (Director on Call) for the Hospitalists listed on amion for assistance.

## 2023-10-05 DIAGNOSIS — J9622 Acute and chronic respiratory failure with hypercapnia: Secondary | ICD-10-CM | POA: Diagnosis not present

## 2023-10-05 LAB — CBC WITH DIFFERENTIAL/PLATELET
Abs Immature Granulocytes: 0.04 K/uL (ref 0.00–0.07)
Basophils Absolute: 0 K/uL (ref 0.0–0.1)
Basophils Relative: 0 %
Eosinophils Absolute: 0.1 K/uL (ref 0.0–0.5)
Eosinophils Relative: 1 %
HCT: 27.9 % — ABNORMAL LOW (ref 36.0–46.0)
Hemoglobin: 8.7 g/dL — ABNORMAL LOW (ref 12.0–15.0)
Immature Granulocytes: 1 %
Lymphocytes Relative: 21 %
Lymphs Abs: 1.7 K/uL (ref 0.7–4.0)
MCH: 28.7 pg (ref 26.0–34.0)
MCHC: 31.2 g/dL (ref 30.0–36.0)
MCV: 92.1 fL (ref 80.0–100.0)
Monocytes Absolute: 0.8 K/uL (ref 0.1–1.0)
Monocytes Relative: 10 %
Neutro Abs: 5.5 K/uL (ref 1.7–7.7)
Neutrophils Relative %: 67 %
Platelets: 239 K/uL (ref 150–400)
RBC: 3.03 MIL/uL — ABNORMAL LOW (ref 3.87–5.11)
RDW: 12.5 % (ref 11.5–15.5)
WBC: 8.2 K/uL (ref 4.0–10.5)
nRBC: 0 % (ref 0.0–0.2)

## 2023-10-05 LAB — BASIC METABOLIC PANEL WITH GFR
Anion gap: 14 (ref 5–15)
BUN: 87 mg/dL — ABNORMAL HIGH (ref 8–23)
CO2: 32 mmol/L (ref 22–32)
Calcium: 9.7 mg/dL (ref 8.9–10.3)
Chloride: 90 mmol/L — ABNORMAL LOW (ref 98–111)
Creatinine, Ser: 2.05 mg/dL — ABNORMAL HIGH (ref 0.44–1.00)
GFR, Estimated: 26 mL/min — ABNORMAL LOW (ref >60)
Glucose, Bld: 114 mg/dL — ABNORMAL HIGH (ref 70–99)
Potassium: 3.9 mmol/L (ref 3.5–5.1)
Sodium: 136 mmol/L (ref 135–145)

## 2023-10-05 MED ORDER — FUROSEMIDE 10 MG/ML IJ SOLN
80.0000 mg | Freq: Three times a day (TID) | INTRAMUSCULAR | Status: DC
  Administered 2023-10-05 – 2023-10-06 (×6): 80 mg via INTRAVENOUS
  Filled 2023-10-05 (×6): qty 8

## 2023-10-05 MED ORDER — MUSCLE RUB 10-15 % EX CREA
1.0000 | TOPICAL_CREAM | CUTANEOUS | Status: DC | PRN
  Filled 2023-10-05: qty 85

## 2023-10-05 NOTE — Plan of Care (Signed)

## 2023-10-05 NOTE — Plan of Care (Signed)

## 2023-10-05 NOTE — Progress Notes (Signed)
 PROGRESS NOTE    Janice Ryan  FMW:[id number] DOB: [birth year] DOA: 09/26/2023 PCP: Lorren Greig PARAS, NP   Brief Narrative:  68 y.o. female with medical history significant for chronic hypoxic hypercapnic respiratory failure-wears 5 L at home, obstructive sleep apnea, moderate persistent asthma, essential hypertension, generalized anxiety disorder, allergic rhinitis, who is admitted to [facility] on 09/26/2023 with acute on chronic hypoxic hypercapnic respiratory failure after presenting from home to [facility] ED complaining of right hip pain.  This happened after tripping and falling at home on 6/24.  Following the fall she had right hip discomfort radiating into the right groin every time she attempted to bear weight.  Hospital stay complicated by worsening kidney function due to volume overload   Assessment & Plan:   Principal Problem:   Acute on chronic respiratory failure with hypercapnia (HCC) Active Problems:   Essential hypertension   Moderate persistent asthma   GAD (generalized anxiety disorder)   Morbid obesity (HCC)   Acute right hip pain   Low back pain   Acute encephalopathy   Allergic rhinitis  Acute on chronic hypoxic and hypercarbic respiratory failure due to combination of obesity hypoventilation syndrome and acute on chronic congestive heart failure with preserved ejection fraction-initial chest x-ray completely normal however due to AKI, she was given fluids which caused volume overload/acute on chronic congestive heart failure with preserved ejection fraction as echo showed normal EF.  She was then started on diuresis.  Appears to be using 5 L of oxygen at baseline at home.  Currently on 6 L of high flow oxygen.  Has had another 3.5 L of diuresis in last 24 hours.  BUN/creatinine improved, creatinine down to 2.06.  Her bilateral lower extremity edema has improved significantly as well.  Will resume Lasix  at 80 mg IV 3 times daily.   Acute kidney injury  -patient's creatinine on presentation was 1.2.  She has some intermittent low blood pressure reads but she is normotensive now for most part.  Patient's creatinine started to climb and peaked at 4.2 on 10/01/2023.  Nephrology consulted, started on diuresis, creatinine improved to 2.4 but jumped to 2.6 on 10/04/2023, held diuretics same day, creatinine improved to 2.06 today.  Resuming diuretics today.     Hyperkalemia-resolved after Lokelma .  Was secondary to AKI.  Mild hyponatremia: Improving, 134 today.   Acute toxic/metabolic encephalopathy-became somnolent after multiple doses of Dilaudid  and Ativan  in the ED.  Now resolved -avoid IV narcotics   Right hip pain-imaging with CT did not show any evidence of fractures.  Pain control, physical therapy  Chronic normocytic anemia: Hemoglobin stable.   Chronic low back pain -slightly worse now after the fall.  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  Continue supportive care   Morbid obesity class III-BMI 63.  She would benefit from weight loss and dietary modification which is counseled.  Depression/anxiety: Patient upset about not getting her medications for these for last 10 days.  She thinks that she is now having withdrawal although appears to be very calm and composed.  I have resumed all her home medications.  Disposition: PT OT recommended LTAC.  I did peer to peer with the insurance company on 10/03/2023.  LTAC was denied.  LTAC appealing.  However patient tells me today that she is not interested in going to LTAC and she would like to go to SNF instead.  I have informed TOC  so they can start the process.  DVT prophylaxis: heparin  injection 5,000 Units Start: 09/30/23 2200 SCDs Start: 09/26/23 2031   Code Status: Full Code  Family Communication:  None present at bedside.  Plan of care discussed with patient in length and he/she verbalized  understanding and agreed with it.  Status is: Inpatient Remains inpatient appropriate because: Still on higher oxygen and creatinine higher than baseline.   Estimated body mass index is 66.1 kg/m as calculated from the following:   Height as of this encounter: 5' 8 (1.727 m).   Weight as of this encounter: 197.2 kg.    Nutritional Assessment: Body mass index is 66.1 kg/m.[name] Seen by dietician.  I agree with the assessment and plan as outlined below: Nutrition Status:        . Skin Assessment: I have examined the patient's skin and I agree with the wound assessment as performed by the wound care RN as outlined below:    Consultants:  Nephrology  Procedures:  As above  Antimicrobials:  Anti-infectives (From admission, onward)    None         Subjective: Seen and examined, sitting in the recliner.  Has no new complaint.  Continues to ask to liberate her oral fluid intake.  I have been counseling her every day about the importance of restricting fluid intake.  Objective: Vitals:   10/05/23 0829 10/05/23 0839 10/05/23 0841 10/05/23 0845  BP:      Pulse: 74 77    Resp: 20 20    Temp:      TempSrc:      SpO2: 96% 95% 94% 100%  Weight:      Height:        Intake/Output Summary (Last 24 hours) at 10/05/2023 1055 Last data filed at 10/05/2023 0900 Gross per 24 hour  Intake 915 ml  Output 2800 ml  Net -1885 ml   Filed Weights   10/01/23 0500 10/04/23 0500 10/05/23 0500  Weight: (!) 190.1 kg (!) 196.7 kg (!) 197.2 kg    Examination:  General exam: Appears calm and comfortable, morbidly obese Respiratory system: Clear to auscultation. Respiratory effort normal. Cardiovascular system: S1 & S2 heard, RRR. No JVD, murmurs, rubs, gallops or clicks.  +1-2 pitting edema bilateral lower extremity. Gastrointestinal system: Abdomen is nondistended, soft and nontender. No organomegaly or masses felt. Normal bowel sounds heard. Central nervous system: Alert and oriented.  No focal neurological deficits. Extremities: Symmetric 5 x 5 power. Skin: No rashes, lesions or ulcers.  Psychiatry: Judgement and insight appear normal. Mood & affect appropriate.    Data Reviewed: I have personally reviewed following labs and imaging studies  CBC: Recent Labs  Lab 10/01/23 0529 10/02/23 0603 10/03/23 0258 10/04/23 0250 10/05/23 0303  WBC 9.3 8.3 8.5 7.8 8.2  NEUTROABS  --   --   --  4.8 5.5  HGB 8.8* 9.1* 9.0* 9.0* 8.7*  HCT 29.6* 29.8* 28.9* 29.3* 27.9*  MCV 95.8 94.3 92.6 92.4 92.1  PLT 223 219 226 227 239   Basic Metabolic Panel: Recent Labs  Lab 10/01/23 0336 10/02/23 0603 10/03/23 0258 10/04/23 0250 10/05/23 0303  NA 128* 134* 134* 133* 136  K 5.5* 4.7 4.6 4.1 3.9  CL 91* 91* 92* 90* 90*  CO2 25 28 30 29  32  GLUCOSE 109* 101* 112* 109* 114*  BUN 72* 83* 89* 93* 87*  CREATININE 4.20* 2.98* 2.41* 2.60* 2.05*  CALCIUM 8.3* 9.2 9.0 9.0 9.7  MG 2.7*  --   --   --   --  GFR: Estimated Creatinine Clearance: 49.9 mL/min (A) (by C-G formula based on SCr of 2.05 mg/dL (H)). Liver Function Tests: Recent Labs  Lab 09/30/23 0315 10/01/23 0336  AST 9* 10*  ALT 12 10  ALKPHOS 79 81  BILITOT 0.7 0.6  PROT 6.8 6.4*  ALBUMIN 3.2* 3.1*   No results for input(s): LIPASE, AMYLASE in the last 168 hours. No results for input(s): AMMONIA in the last 168 hours. Coagulation Profile: No results for input(s): INR, PROTIME in the last 168 hours. Cardiac Enzymes: No results for input(s): CKTOTAL, CKMB, CKMBINDEX, TROPONINI in the last 168 hours. BNP (last 3 results) No results for input(s): PROBNP in the last 8760 hours. HbA1C: No results for input(s): HGBA1C in the last 72 hours. CBG: No results for input(s): GLUCAP in the last 168 hours. Lipid Profile: No results for input(s): CHOL, HDL, LDLCALC, TRIG, CHOLHDL, LDLDIRECT in the last 72 hours. Thyroid Function Tests: No results for input(s): TSH, T4TOTAL,  FREET4, T3FREE, THYROIDAB in the last 72 hours. Anemia Panel: No results for input(s): VITAMINB12, FOLATE, FERRITIN, TIBC, IRON, RETICCTPCT in the last 72 hours. Sepsis Labs: No results for input(s): PROCALCITON, LATICACIDVEN in the last 168 hours.   No results found for this or any previous visit (from the past 240 hours).   Radiology Studies: No results found.  Scheduled Meds:  busPIRone   15 mg Oral TID   Chlorhexidine  Gluconate Cloth  6 each Topical Daily   diclofenac  Sodium  2 g Topical QID   doxepin   50 mg Oral Daily   feeding supplement  237 mL Oral BID BM   fluticasone   2 spray Each Nare Daily   fluticasone  furoate-vilanterol  1 puff Inhalation Daily   furosemide   80 mg Intravenous TID   gabapentin   300 mg Oral QHS   heparin  injection (subcutaneous)  5,000 Units Subcutaneous Q8H   ipratropium-albuterol   3 mL Nebulization TID   lidocaine   1 patch Transdermal Q24H   mirtazapine   15 mg Oral QHS   montelukast   10 mg Oral QHS   pantoprazole   40 mg Oral Daily   Continuous Infusions:     LOS: 9 days   Fredia Skeeter, MD [facility]  10/05/2023, 10:55 AM   *Please note that this is a verbal dictation therefore any spelling or grammatical errors are due to the Dragon Medical One system interpretation.  Please page via Amion and do not message via secure chat for urgent patient care matters. Secure chat can be used for non urgent patient care matters.  How to contact the TR[facility] Attending or Consulting provider 7A - 7P or covering provider during after hours 7P -7A, for this patient?  Check the care team in [facility] and look for a) attending/consulting TRH provider listed and b) the TRH team listed. Page or secure chat 7A-7P. Log into [email or web address] and use [facility]'s universal password to access. If you do not have the password, please contact the hospital operator. Locate the [facility] provider you are looking for under [facility] and page to a  number that you can be directly reached. If you still have difficulty reaching the provider, please page the [facility] (Director on Call) for the Hospitalists listed on amion for assistance.

## 2023-10-05 NOTE — Progress Notes (Signed)
 Arrived to [facility] (Janice Ryan) 5E 1518 from ICU/Stepdown as a transfer.  Vitals obtained and placed on telemetry monitor.  Oriented to unit routine and call bell usage.     10/05/23 1749 10/05/23 1755  Vitals  Temp 99.1 F (37.3 C)  --   BP 96/75  --   MAP (mmHg) 83  --   BP Location Left Arm  --   BP Method Automatic  --   Patient Position (if appropriate) Sitting  --   Pulse Rate 86  --   Pulse Rate Source Monitor  --   Resp 19  --   MEWS COLOR  MEWS Score Color Green  --   Oxygen Therapy  SpO2 98 %  --   O2 Device Nasal Cannula  --   Pain Assessment  Pain Scale 0-10  --   Pain Score 4  --   ECG Monitoring  Telemetry Box Number  --  [id number]  Tele Box Verification Completed by Second Verifier  --  Completed (Hilary NT/Ava NT)  MEWS Score  MEWS Temp 0  --   MEWS Systolic 1  --   MEWS Pulse 0  --   MEWS RR 0  --   MEWS LOC 0  --   MEWS Score 1  --

## 2023-10-05 NOTE — Progress Notes (Signed)
   10/05/23 2232  BiPAP/CPAP/SIPAP  BiPAP/CPAP/SIPAP Pt Type Adult  BiPAP/CPAP/SIPAP DREAMSTATIOND  Mask Type Full face mask  Dentures removed? Not applicable  Mask Size Large  Respiratory Rate 16 breaths/min  IPAP 22 cmH20  EPAP 6 cmH2O  Flow Rate 6 lpm  Patient Home Machine No  Patient Home Mask No  Patient Home Tubing No  Auto Titrate No  CPAP/SIPAP surface wiped down Yes  Device Plugged into RED Power Outlet Yes  Oxygen Percent 44 %  BiPAP/CPAP /SiPAP Vitals  Pulse Rate 71  Resp 16  SpO2 95 %  Bilateral Breath Sounds Diminished  MEWS Score/Color  MEWS Score 1  MEWS Score Color Green

## 2023-10-05 NOTE — [place] FL2 (Signed)
 [place]  MEDICAID FL2 LEVEL OF CARE FORM     IDENTIFICATION  Patient Name: Janice Ryan Birthdate: 15-Oct-1955 Sex: female Admission Date (Current Location): 09/26/2023  [facility] and [place] Number:  Producer, television/film/video and Address:  [facility],  [address] [place] [address]      Provider Number: [id number]  Attending Physician Name and Address:  Vernon Ranks, MD  Relative Name and Phone Number:       Current Level of Care: Hospital Recommended Level of Care: Skilled Nursing Facility Prior Approval Number:    Date Approved/Denied:   PASRR Number: [id number]  Discharge Plan: SNF    Current Diagnoses: Patient Active Problem List   Diagnosis Date Noted   Low back pain 09/27/2023   Acute encephalopathy 09/27/2023   Allergic rhinitis 09/27/2023   Acute on chronic respiratory failure with hypercapnia (HCC) 09/26/2023   Mild intermittent asthma 09/13/2023   CAP (community acquired pneumonia) 09/11/2023   Ground-level fall 09/11/2023   Acute right hip pain 09/11/2023   Mood disorder (HCC) 09/11/2023   Hypokalemia 02/25/2023   Morbid obesity (HCC) 02/25/2023   OSA (obstructive sleep apnea) 02/07/2023   Obesity hypoventilation syndrome (HCC) 02/07/2023   Asthma, chronic, unspecified asthma severity, with acute exacerbation 02/07/2023   Suspected novel influenza A virus infection 02/01/2022   AKI (acute kidney injury) (HCC) 01/25/2022   Hypercalcemia 01/24/2022   Pneumonia due to infectious organism 01/23/2022   Acute on chronic respiratory failure with hypoxia and hypercapnia (HCC) 01/21/2022   Fall at home, initial encounter 01/21/2022   Poor social situation 01/21/2022   Primary osteoarthritis of right knee 10/21/2019   Depression 03/31/2019   Major depressive disorder, recurrent episode, 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   Chronic respiratory failure with hypoxia (HCC) 12/10/2018   Essential hypertension 12/10/2018   GAD (generalized anxiety  disorder) 12/10/2018   Chronic pain of right knee    Moderate persistent asthma 07/28/2018   Contusion of left knee 09/20/2017   Sprain of anterior talofibular ligament of left ankle 09/20/2017    Orientation RESPIRATION BLADDER Height & Weight     Self, Time, Situation, Place  O2 (6L HFNC) Continent, External catheter Weight: (!) 434 lb 11.2 oz (197.2 kg) Height:  5' 8 (172.7 cm)  BEHAVIORAL SYMPTOMS/MOOD NEUROLOGICAL BOWEL NUTRITION STATUS      Continent Diet (renal)  AMBULATORY STATUS COMMUNICATION OF NEEDS Skin   Extensive Assist Verbally Normal                       Personal Care Assistance Level of Assistance  Bathing, Feeding, Dressing Bathing Assistance: Limited assistance Feeding assistance: Independent Dressing Assistance: Limited assistance     Functional Limitations Info  Sight, Hearing, Speech Sight Info: Adequate Hearing Info: Adequate Speech Info: Adequate    SPECIAL CARE FACTORS FREQUENCY  PT (By licensed PT), OT (By licensed OT)     PT Frequency: 5 x a week OT Frequency: 5 x a week            Contractures Contractures Info: Not present    Additional Factors Info  Code Status, Allergies Code Status Info: full Allergies Info: Sulfa Antibiotics           Current Medications (10/05/2023):  This is the current hospital active medication list Current Facility-Administered Medications  Medication Dose Route Frequency Provider Last Rate Last Admin   acetaminophen  (TYLENOL ) tablet 650 mg  650 mg Oral Q6H PRN Howerter, Justin B, DO   650 mg at  10/04/23 1505   Or   acetaminophen  (TYLENOL ) suppository 650 mg  650 mg Rectal Q6H PRN Howerter, Justin B, DO       albuterol  (PROVENTIL ) (2.5 MG/3ML) 0.083% nebulizer solution 2.5 mg  2.5 mg Nebulization Q4H PRN Howerter, Justin B, DO       busPIRone  (BUSPAR ) tablet 15 mg  15 mg Oral TID Pahwani, Ravi, MD   15 mg at 10/05/23 9063   Chlorhexidine  Gluconate Cloth 2 % PADS 6 each  6 each Topical Daily  Howerter, Justin B, DO   6 each at 10/04/23 2200   diclofenac  Sodium (VOLTAREN ) 1 % topical gel 2 g  2 g Topical QID Chavez, Abigail, NP   2 g at 10/05/23 1014   docusate sodium  (COLACE) capsule 100 mg  100 mg Oral Daily PRN Chavez, Abigail, NP       doxepin  (SINEQUAN ) capsule 50 mg  50 mg Oral Daily Pahwani, Ravi, MD   50 mg at 10/05/23 0936   feeding supplement (ENSURE PLUS HIGH PROTEIN) liquid 237 mL  237 mL Oral BID BM Gherghe, Costin M, MD   237 mL at 10/05/23 1014   fluticasone  (FLONASE ) 50 MCG/ACT nasal spray 2 spray  2 spray Each Nare Daily Howerter, Justin B, DO   2 spray at 10/04/23 0913   fluticasone  furoate-vilanterol (BREO ELLIPTA ) 200-25 MCG/ACT 1 puff  1 puff Inhalation Daily Vann, Jessica U, DO   1 puff at 10/05/23 0841   furosemide  (LASIX ) injection 80 mg  80 mg Intravenous TID Pahwani, Ravi, MD   80 mg at 10/05/23 9062   gabapentin  (NEURONTIN ) capsule 300 mg  300 mg Oral QHS Vann, Jessica U, DO   300 mg at 10/04/23 2033   heparin  injection 5,000 Units  5,000 Units Subcutaneous Q8H Gherghe, Costin M, MD   5,000 Units at 10/05/23 0554   ipratropium-albuterol  (DUONEB) 0.5-2.5 (3) MG/3ML nebulizer solution 3 mL  3 mL Nebulization TID Juvenal Raisin U, DO   3 mL at 10/05/23 1356   lidocaine  (LIDODERM ) 5 % 1 patch  1 patch Transdermal Q24H Howerter, Justin B, DO   1 patch at 10/05/23 0757   melatonin tablet 3 mg  3 mg Oral QHS PRN Howerter, Justin B, DO   3 mg at 10/04/23 2205   mirtazapine  (REMERON ) tablet 15 mg  15 mg Oral QHS Pahwani, Ravi, MD   15 mg at 10/04/23 2205   montelukast  (SINGULAIR ) tablet 10 mg  10 mg Oral QHS Vann, Jessica U, DO   10 mg at 10/04/23 2033   naloxone  (NARCAN ) injection 0.4 mg  0.4 mg Intravenous PRN Howerter, Justin B, DO       ondansetron  (ZOFRAN ) injection 4 mg  4 mg Intravenous Q6H PRN Howerter, Justin B, DO       Oral care mouth rinse  15 mL Mouth Rinse PRN Vernon Ranks, MD       oxyCODONE  (Oxy IR/ROXICODONE ) immediate release tablet 2.5-5 mg  2.5-5 mg  Oral Q6H PRN Vann, Jessica U, DO   5 mg at 10/05/23 0754   pantoprazole  (PROTONIX ) EC tablet 40 mg  40 mg Oral Daily Vann, Jessica U, DO   40 mg at 10/05/23 0754   traZODone  (DESYREL ) tablet 25 mg  25 mg Oral QHS PRN Chavez, Abigail, NP   25 mg at 10/04/23 2205     Discharge Medications: Please see discharge summary for a list of discharge medications.  Relevant Imaging Results:  Relevant Lab Results:   Additional Information SSN:[id number]  Weight: 434lbs  Ariann Khaimov M Miken Stecher, LCSW

## 2023-10-05 NOTE — Progress Notes (Signed)
 Occupational Therapy Treatment Patient Details Name: Janice Ryan MRN: [id number] DOB: 11/07/1955 Today's Date: 10/05/2023   History of present illness Janice Ryan is a 68 yr old female with medical history significant for chronic hypoxic hypercapnic respiratory failure, obstructive sleep apnea, moderate persistent asthma, essential hypertension, generalized anxiety disorder, allergic rhinitis, who is admitted to [facility] on 09/26/2023 with acute on chronic hypoxic hypercapnic respiratory failure after presenting from home to [facility] ED complaining of right hip pain.   OT comments  The pt was seen for general functional strengthening and progression for functional activity. She required +2 mod assist to stand from the EOB using a RW. She demonstrated a standing tolerance of ~4.5 minutes, before performing a step-pivot transfer to the bedside chair. She required cues for pursed lip breathing, as her O2 decreased to 86% with activity. Once seated in the chair, she performed upper body grooming with set-up assist. She reported discomfort of her R LE immobilizer. Continue OT plan of care. Patient will benefit from continued inpatient follow up therapy, <3 hours/day.        If plan is discharge home, recommend the following:  A lot of help with bathing/dressing/bathroom;Direct supervision/assist for medications management;Assistance with cooking/housework;Assist for transportation;Help with stairs or ramp for entrance;A lot of help with walking and/or transfers   Equipment Recommendations  Tub/shower bench    Recommendations for Other Services      Precautions / Restrictions Precautions Precautions: Fall Required Braces or Orthoses: Knee Immobilizer - Right Knee Immobilizer - Right: On when out of bed or walking Restrictions Weight Bearing Restrictions Per Provider Order: No       Mobility Bed Mobility Overal bed mobility: Needs Assistance Bed Mobility: Supine to  Sit     Supine to sit: HOB elevated, Used rails, +2 for physical assistance, +2 for safety/equipment, Min assist     Transfers Overall transfer level: Needs assistance Equipment used: Rolling walker (2 wheels) Transfers: Sit to/from Stand, Bed to chair/wheelchair/BSC Sit to Stand: +2 safety/equipment, +2 physical assistance, Mod assist     Step pivot transfers: +2 physical assistance, +2 safety/equipment, Contact guard assist     General transfer comment: pt required 2 attempts and use of momentum with rocking motion for sit to stand from EOB with cues and A to power up, once in standing pt required B UE support and progress to static standing no UE support and tolerated ~ 4.5 min standing with cues for pursed lip breathing with pt desaturating to 86% on supplemental O2, pt required cues for safety and CGA x 2 to complete step pivot to recliner     Balance     Sitting balance-Leahy Scale: Fair       Standing balance-Leahy Scale: Fair         ADL either performed or assessed with clinical judgement   ADL Overall ADL's : Needs assistance/impaired     Grooming: Set up;Supervision/safety;Sitting Grooming Details (indicate cue type and reason): She performed hand washing, face washing, and teeth brushing in sitting at chair level.             Lower Body Dressing: Total assistance;Bed level Lower Body Dressing Details (indicate cue type and reason): to donn socks                     Communication Communication Communication: No apparent difficulties   Cognition Arousal: Alert Behavior During Therapy: [facility] for tasks assessed/performed        Following commands: Intact  Pertinent Vitals/ Pain       Pain Assessment Pain Assessment: Faces Pain Score: 3  Pain Location: R knee Pain Intervention(s): Limited activity within patient's tolerance, Monitored during session   Frequency  Min 2X/week        Progress Toward  Goals  OT Goals(current goals can now be found in the care plan section)     Acute Rehab OT Goals OT Goal Formulation: With patient Time For Goal Achievement: 10/11/23 Potential to Achieve Goals: Good  Plan      Co-evaluation    PT/OT/SLP Co-Evaluation/Treatment: Yes Reason for Co-Treatment: Complexity of the patient's impairments (multi-system involvement);Necessary to address cognition/behavior during functional activity;For patient/therapist safety PT goals addressed during session: Mobility/safety with mobility OT goals addressed during session: ADL's and self-care;Proper use of Adaptive equipment and DME      AM-PAC OT 6 Clicks Daily Activity     Outcome Measure   Help from another person eating meals?: None Help from another person taking care of personal grooming?: A Little Help from another person toileting, which includes using toliet, bedpan, or urinal?: A Lot Help from another person bathing (including washing, rinsing, drying)?: A Lot Help from another person to put on and taking off regular upper body clothing?: A Little Help from another person to put on and taking off regular lower body clothing?: A Lot 6 Click Score: 16    End of Session Equipment Utilized During Treatment: Rolling walker (2 wheels);Oxygen;Gait belt;Right knee immobilizer  OT Visit Diagnosis: Unsteadiness on feet (R26.81);Other abnormalities of gait and mobility (R26.89);Muscle weakness (generalized) (M62.81);History of falling (Z91.81);Pain Pain - Right/Left: Right Pain - part of body: Knee   Activity Tolerance Other (comment) (Fair+ tolerance)   Patient Left in chair;with chair alarm set;with call bell/phone within reach   Nurse Communication Mobility status        Time: 9049-8986 OT Time Calculation (min): 23 min  Charges: OT General Charges $OT Visit: 1 Visit OT Treatments $Self Care/Home Management : 8-22 mins     Delanna LITTIE Molt, OTR/L 10/05/2023, 12:02 PM

## 2023-10-05 NOTE — Progress Notes (Signed)
 Physical Therapy Treatment Patient Details Name: Janice Ryan MRN: [id number] DOB: 09/12/1955 Today's Date: 10/05/2023   History of Present Illness Janice Ryan is a 68 y.o. female with medical history significant for chronic hypoxic hypercapnic respiratory failure, obstructive sleep apnea, moderate persistent asthma, essential hypertension, generalized anxiety disorder, allergic rhinitis, who is admitted to [facility] on 09/26/2023 with acute on chronic hypoxic hypercapnic respiratory failure after presenting from home to [facility] ED complaining of right hip pain.    PT Comments   Pt admitted with above diagnosis.  Pt currently with functional limitations due to the deficits listed below (see PT Problem List). Pt seated in recliner when PT returned with nursing staff requesting A for transfer and donning R KI. Pt was able to sit up for lunch and had visitors in the room. Pt required total A to don KI while seated in recliner. Pt required max A x 1 for push to stand from recliner with rocking motion for momentum and 2 attempts, once in standing pt required min A x 2 for safety to complete SPT back to bed, pt required max A x 2 for sit to supine. Pt left in bed, all needs in place, visitors and nursing staff present. Pt on 6 L/min HFNC. No change in d/c recommendations at this time. Pt will benefit from acute skilled PT to increase their independence and safety with mobility to allow discharge.      If plan is discharge home, recommend the following: Two people to help with walking and/or transfers;A lot of help with bathing/dressing/bathroom;Assistance with cooking/housework;Assist for transportation;Help with stairs or ramp for entrance   Can travel by private vehicle     No  Equipment Recommendations  Rolling walker (2 wheels) (bari RW)    Recommendations for Other Services       Precautions / Restrictions Precautions Precautions: Fall Required Braces or Orthoses: Knee  Immobilizer - Right Knee Immobilizer - Right: On when out of bed or walking Restrictions Weight Bearing Restrictions Per Provider Order: No     Mobility  Bed Mobility Overal bed mobility: Needs Assistance Bed Mobility: Sit to Supine     Supine to sit: HOB elevated, Used rails, +2 for physical assistance, +2 for safety/equipment, Min assist Sit to supine: Max assist, Used rails, +2 for physical assistance, +2 for safety/equipment   General bed mobility comments: max A x 2 for sit to supine, pt required A for B LE and for trunk pt is able to participate with repositioning in bed with use of bed rails    Transfers Overall transfer level: Needs assistance Equipment used: Rolling walker (2 wheels) Transfers: Sit to/from Stand, Bed to chair/wheelchair/BSC Sit to Stand: Max assist   Step pivot transfers: +2 physical assistance, +2 safety/equipment, Min assist       General transfer comment: pt seated in recliner when PT entered room. visitors present and nursing staff present and able to assist with transfers and bed mobiltiy. PT donned R KI while seated in recliner and provided staff instruction provided on donning KI and wear schedule. PT had previously communicated with nursing staff per doffing KI while in recliner due to pt discomfort    Ambulation/Gait               General Gait Details: NT   Stairs             Wheelchair Mobility     Tilt Bed    Modified Rankin (Stroke Patients Only)  Balance Overall balance assessment: History of Falls, Needs assistance Sitting-balance support: Feet supported, Bilateral upper extremity supported Sitting balance-Leahy Scale: Fair     Standing balance support: Bilateral upper extremity supported, During functional activity, Reliant on assistive device for balance Standing balance-Leahy Scale: Fair Standing balance comment: R KI donned with total A prior to getting up from recliner pt demonstrating increased  activity tolerance and F static standing balance                            Communication Communication Communication: No apparent difficulties  Cognition Arousal: Alert Behavior During Therapy: WFL for tasks assessed/performed   PT - Cognitive impairments: No apparent impairments                         Following commands: Intact      Cueing Cueing Techniques: Verbal cues  Exercises      General Comments        Pertinent Vitals/Pain Pain Assessment Pain Assessment: No/denies pain    Home Living                          Prior Function            PT Goals (current goals can now be found in the care plan section) Acute Rehab PT Goals Patient Stated Goal: to be able to get stronger, loose weight and have R knee surgery PT Goal Formulation: With patient Time For Goal Achievement: 10/11/23 Potential to Achieve Goals: Good Progress towards PT goals: Progressing toward goals    Frequency    Min 2X/week      PT Plan      Co-evaluation   Reason for Co-Treatment: Complexity of the patient's impairments (multi-system involvement);Necessary to address cognition/behavior during functional activity;For patient/therapist safety PT goals addressed during session: Mobility/safety with mobility OT goals addressed during session: ADL's and self-care;Proper use of Adaptive equipment and DME      AM-PAC PT 6 Clicks Mobility   Outcome Measure  Help needed turning from your back to your side while in a flat bed without using bedrails?: A Little Help needed moving from lying on your back to sitting on the side of a flat bed without using bedrails?: A Little Help needed moving to and from a bed to a chair (including a wheelchair)?: A Little Help needed standing up from a chair using your arms (e.g., wheelchair or bedside chair)?: A Lot Help needed to walk in hospital room?: Total Help needed climbing 3-5 steps with a railing? : Total 6  Click Score: 13    End of Session Equipment Utilized During Treatment: Oxygen;Gait belt;Right knee immobilizer Activity Tolerance: Patient limited by fatigue Patient left: with chair alarm set;with call bell/phone within reach;in bed;with nursing/sitter in room;with family/visitor present (OT present at end of tx session) Nurse Communication: Mobility status;Other (comment) (R KI donned when WB) PT Visit Diagnosis: Unsteadiness on feet (R26.81);Other abnormalities of gait and mobility (R26.89);Repeated falls (R29.6);Muscle weakness (generalized) (M62.81);Difficulty in walking, not elsewhere classified (R26.2);Pain Pain - Right/Left: Right Pain - part of body: Knee;Leg     Time: 8680-8667 PT Time Calculation (min) (ACUTE ONLY): 13 min  Charges:    $Therapeutic Activity: 8-22 mins PT General Charges $$ ACUTE PT VISIT: 1 Visit                     [place], PT  Acute Rehab    [place] VEAR Drone 10/05/2023, 2:02 PM

## 2023-10-05 NOTE — Progress Notes (Signed)
 Physical Therapy Treatment Patient Details Name: Janice Ryan MRN: [id number] DOB: 1955-06-11 Today's Date: 10/05/2023   History of Present Illness Janice Ryan is a 68 y.o. female with medical history significant for chronic hypoxic hypercapnic respiratory failure, obstructive sleep apnea, moderate persistent asthma, essential hypertension, generalized anxiety disorder, allergic rhinitis, who is admitted to [facility] on 09/26/2023 with acute on chronic hypoxic hypercapnic respiratory failure after presenting from home to [facility] ED complaining of right hip pain.    PT Comments   Pt admitted with above diagnosis.  Pt currently with functional limitations due to the deficits listed below (see PT Problem List). Pt in bed when PT arrived. Pt agreeable to therapy intervention and much more alert and communicative then earlier this week. Pt exhibited increased IND with mobility tasks and activity tolerance as demonstrated by standing at RW for ~ 4.5 mins. Bed mobility min A x 2, STS from EOB to Rw R KI donned mod A x 2, static standing CGA x 2 and CGA x 2 for SPT to recliner. Pt on 2 L/min supplemental O2 and desaturated with exertion to 86% with pt recovering quickly with cues for pursed lip breathing. Pt left in bed and all needs in place. Patient will benefit from continued inpatient follow up therapy, <3 hours/day.  Pt will benefit from acute skilled PT to increase their independence and safety with mobility to allow discharge.      If plan is discharge home, recommend the following: Two people to help with walking and/or transfers;A lot of help with bathing/dressing/bathroom;Assistance with cooking/housework;Assist for transportation;Help with stairs or ramp for entrance   Can travel by private vehicle     No  Equipment Recommendations  Rolling walker (2 wheels) (bari RW)    Recommendations for Other Services       Precautions / Restrictions Precautions Precautions:  Fall Required Braces or Orthoses: Knee Immobilizer - Right Knee Immobilizer - Right: On when out of bed or walking Restrictions Weight Bearing Restrictions Per Provider Order: No     Mobility  Bed Mobility Overal bed mobility: Needs Assistance Bed Mobility: Supine to Sit     Supine to sit: HOB elevated, Used rails, +2 for physical assistance, +2 for safety/equipment, Min assist     General bed mobility comments: pt is more alert today and able to engage with therapy and increased IND with bed mobiltiy tasks    Transfers Overall transfer level: Needs assistance Equipment used: Rolling walker (2 wheels) Transfers: Sit to/from Stand, Bed to chair/wheelchair/BSC Sit to Stand: +2 safety/equipment, +2 physical assistance, Mod assist   Step pivot transfers: +2 physical assistance, +2 safety/equipment, Contact guard assist       General transfer comment: pt required 2 attempts and use of momentum with rocking motion for sit to stand from EOB with cues and A to power up, once in standing pt required B UE support and progress to static standing no UE support and tolerated ~ 4.5 min standing with cues for pursed lip breathing with pt desaturating to 86% on supplemental O2, pt required cues for safety and CGA x 2 to complete SPT to recliner    Ambulation/Gait               General Gait Details: NT   Stairs             Wheelchair Mobility     Tilt Bed    Modified Rankin (Stroke Patients Only)       Balance Overall balance  assessment: History of Falls, Needs assistance Sitting-balance support: Feet supported, Bilateral upper extremity supported Sitting balance-Leahy Scale: Fair     Standing balance support: Bilateral upper extremity supported, During functional activity, Reliant on assistive device for balance Standing balance-Leahy Scale: Fair Standing balance comment: R KI donned with total A prior to getting OOB and pt demonstrating increased activity  tolerance and F static standing balance brief moment without UE support                            Communication Communication Communication: No apparent difficulties  Cognition Arousal: Alert Behavior During Therapy: WFL for tasks assessed/performed   PT - Cognitive impairments: No apparent impairments                         Following commands: Intact      Cueing Cueing Techniques: Verbal cues  Exercises      General Comments        Pertinent Vitals/Pain      Home Living                          Prior Function            PT Goals (current goals can now be found in the care plan section) Acute Rehab PT Goals Patient Stated Goal: to be able to get stronger, loose weight and have R knee surgery PT Goal Formulation: With patient Time For Goal Achievement: 10/11/23 Potential to Achieve Goals: Good Progress towards PT goals: Progressing toward goals    Frequency    Min 2X/week      PT Plan      Co-evaluation PT/OT/SLP Co-Evaluation/Treatment: Yes Reason for Co-Treatment: Complexity of the patient's impairments (multi-system involvement);Necessary to address cognition/behavior during functional activity;For patient/therapist safety PT goals addressed during session: Mobility/safety with mobility;Balance;Proper use of DME OT goals addressed during session: ADL's and self-care;Proper use of Adaptive equipment and DME      AM-PAC PT 6 Clicks Mobility   Outcome Measure  Help needed turning from your back to your side while in a flat bed without using bedrails?: A Little Help needed moving from lying on your back to sitting on the side of a flat bed without using bedrails?: A Little Help needed moving to and from a bed to a chair (including a wheelchair)?: A Little Help needed standing up from a chair using your arms (e.g., wheelchair or bedside chair)?: A Lot Help needed to walk in hospital room?: Total Help needed climbing  3-5 steps with a railing? : Total 6 Click Score: 13    End of Session Equipment Utilized During Treatment: Oxygen;Gait belt;Right knee immobilizer Activity Tolerance: Patient limited by fatigue Patient left: with chair alarm set;in chair;with call bell/phone within reach (OT present at end of tx session) Nurse Communication: Mobility status (R KI donned when WB) PT Visit Diagnosis: Unsteadiness on feet (R26.81);Other abnormalities of gait and mobility (R26.89);Repeated falls (R29.6);Muscle weakness (generalized) (M62.81);Difficulty in walking, not elsewhere classified (R26.2);Pain Pain - Right/Left: Right Pain - part of body: Knee;Leg     Time: 9060-9040 PT Time Calculation (min) (ACUTE ONLY): 20 min  Charges:    $Therapeutic Activity: 8-22 mins PT General Charges $$ ACUTE PT VISIT: 1 Visit                     [place], PT Acute Rehab  [place] VEAR Drone 10/05/2023, 11:03 AM

## 2023-10-06 DIAGNOSIS — J9622 Acute and chronic respiratory failure with hypercapnia: Secondary | ICD-10-CM | POA: Diagnosis not present

## 2023-10-06 LAB — BASIC METABOLIC PANEL WITH GFR
Anion gap: 16 — ABNORMAL HIGH (ref 5–15)
BUN: 73 mg/dL — ABNORMAL HIGH (ref 8–23)
CO2: 32 mmol/L (ref 22–32)
Calcium: 10.4 mg/dL — ABNORMAL HIGH (ref 8.9–10.3)
Chloride: 89 mmol/L — ABNORMAL LOW (ref 98–111)
Creatinine, Ser: 1.64 mg/dL — ABNORMAL HIGH (ref 0.44–1.00)
GFR, Estimated: 34 mL/min — ABNORMAL LOW (ref >60)
Glucose, Bld: 128 mg/dL — ABNORMAL HIGH (ref 70–99)
Potassium: 3.4 mmol/L — ABNORMAL LOW (ref 3.5–5.1)
Sodium: 137 mmol/L (ref 135–145)

## 2023-10-06 LAB — GLUCOSE, CAPILLARY: Glucose-Capillary: 103 mg/dL — ABNORMAL HIGH (ref 70–99)

## 2023-10-06 MED ORDER — POTASSIUM CHLORIDE CRYS ER 20 MEQ PO TBCR
40.0000 meq | EXTENDED_RELEASE_TABLET | ORAL | Status: AC
  Administered 2023-10-06 (×2): 40 meq via ORAL
  Filled 2023-10-06 (×2): qty 2

## 2023-10-06 NOTE — Plan of Care (Signed)

## 2023-10-06 NOTE — Progress Notes (Signed)
   10/06/23 1407  Oxygen Therapy/Pulse Ox  SpO2 (!) (S)  88 % (Pt's 02 was disconnected from the water bottle. Sp02 77% on room air, applied [place], increased to 87% prior to administering neb tx.)

## 2023-10-06 NOTE — Progress Notes (Signed)
   10/06/23 2338  BiPAP/CPAP/SIPAP  Reason BIPAP/CPAP not in use Non-compliant (PATIENT STATED THAT SHE HAS A HEADACHE AND SHE NEEDS WATER AND ICE. SHE STATED SHE WILL LET ME KNOW IF SHE WANTS TO WEAR HER BIPAP)  BiPAP/CPAP /SiPAP Vitals  Resp 15  MEWS Score/Color  MEWS Score 0  MEWS Score Color Green

## 2023-10-06 NOTE — Progress Notes (Signed)
 PROGRESS NOTE    Janice Ryan  FMW:[id number] DOB: February 26, 1956 DOA: 09/26/2023 PCP: Janice Greig PARAS, NP   Brief Narrative:  68 y.o. female with medical history significant for chronic hypoxic hypercapnic respiratory failure-wears 5 L at home, obstructive sleep apnea, moderate persistent asthma, essential hypertension, generalized anxiety disorder, allergic rhinitis, who is admitted to [facility] on 09/26/2023 with acute on chronic hypoxic hypercapnic respiratory failure after presenting from home to [facility] ED complaining of right hip pain.  This happened after tripping and falling at home on 6/24.  Following the fall she had right hip discomfort radiating into the right groin every time she attempted to bear weight.  Hospital stay complicated by worsening kidney function due to volume overload   Assessment & Plan:   Principal Problem:   Acute on chronic respiratory failure with hypercapnia (HCC) Active Problems:   Essential hypertension   Moderate persistent asthma   GAD (generalized anxiety disorder)   Morbid obesity (HCC)   Acute right hip pain   Low back pain   Acute encephalopathy   Allergic rhinitis  Acute on chronic hypoxic and hypercarbic respiratory failure due to combination of obesity hypoventilation syndrome and acute on chronic congestive heart failure with preserved ejection fraction-initial chest x-ray completely normal however due to AKI, she was given fluids which caused volume overload/acute on chronic congestive heart failure with preserved ejection fraction as echo showed normal EF.  She was then started on diuresis.  Appears to be using 5 L of oxygen at baseline at home.  Currently on 4 L of high flow oxygen.  Has had another 1.8 L of diuresis in last 24 hours.  BUN/creatinine improved, creatinine down to 1.64.  Her bilateral lower extremity edema has improved significantly as well.  Will continue Lasix  at 80 mg IV 3 times daily.   Acute kidney injury  -patient's creatinine on presentation was 1.2.  She has some intermittent low blood pressure reads but she is normotensive now for most part.  Patient's creatinine started to climb and peaked at 4.2 on 10/01/2023.  Nephrology consulted, started on diuresis, creatinine improved to 2.06 but jumped to 2.6 on 10/04/2023, held diuretics same day, creatinine improved to 2.06, resumed diuretics on 10/05/2023, creatinine continues to improve further today.   Hyperkalemia-resolved after Lokelma .  Was secondary to AKI.  Hypokalemia: Replenished.  Mild hyponatremia: Resolved.   Acute toxic/metabolic encephalopathy-became somnolent after multiple doses of Dilaudid  and Ativan  in the ED.  Now resolved -avoid IV narcotics   Right hip pain-imaging with CT did not show any evidence of fractures.  Pain control, physical therapy  Chronic normocytic anemia: Hemoglobin stable.   Chronic low back pain -slightly worse now after the fall.  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  Continue supportive care   Morbid obesity class III-BMI 63.  She would benefit from weight loss and dietary modification which is counseled.  Depression/anxiety: Patient upset about not getting her medications for these for last 10 days.  She thinks that she is now having withdrawal although appears to be very calm and composed.  I have resumed all her home medications.  Disposition: PT OT recommended LTAC.  I did peer to peer with the insurance company on 10/03/2023.  LTAC was denied.  LTAC appealing.  However on the morning of 10/05/2023, patient told me that she is not interested in going to LTAC and instead wants to go to  SNF.  I informed TOC the same day and requested to start the process.  DVT prophylaxis: heparin  injection 5,000 Units Start: 09/30/23 2200 SCDs Start: 09/26/23 2031   Code Status: Full Code  Family Communication:  None present at  bedside.  Plan of care discussed with patient in length and he/she verbalized understanding and agreed with it.  Status is: Inpatient Remains inpatient appropriate because: She is now medically stable.  Pending placement.  Estimated body mass index is 66.1 kg/m as calculated from the following:   Height as of this encounter: 5' 8 (1.727 m).   Weight as of this encounter: 197.2 kg.    Nutritional Assessment: Body mass index is 66.1 kg/m.[name] Seen by dietician.  I agree with the assessment and plan as outlined below: Nutrition Status:        . Skin Assessment: I have examined the patient's skin and I agree with the wound assessment as performed by the wound care RN as outlined below:    Consultants:  Nephrology  Procedures:  As above  Antimicrobials:  Anti-infectives (From admission, onward)    None         Subjective: Patient seen and examined, no complaints today.  She is in very happy mood today.  Objective: Vitals:   10/05/23 2235 10/06/23 0021 10/06/23 0406 10/06/23 0850  BP:   (!) 149/56   Pulse:   76   Resp:  15 20 (!) 21  Temp:   98.3 F (36.8 C)   TempSrc:      SpO2: 95%  92% 96%  Weight:      Height:        Intake/Output Summary (Last 24 hours) at 10/06/2023 1032 Last data filed at 10/06/2023 0800 Gross per 24 hour  Intake 1320 ml  Output 1825 ml  Net -505 ml   Filed Weights   10/01/23 0500 10/04/23 0500 10/05/23 0500  Weight: (!) 190.1 kg (!) 196.7 kg (!) 197.2 kg    Examination:  General exam: Appears calm and comfortable, morbidly obese Respiratory system: Clear to auscultation. Respiratory effort normal. Cardiovascular system: S1 & S2 heard, RRR. No JVD, murmurs, rubs, gallops or clicks.  +1 pitting edema bilateral lower extremity. Gastrointestinal system: Abdomen is nondistended, soft and nontender. No organomegaly or masses felt. Normal bowel sounds heard. Central nervous system: Alert and oriented. No focal neurological  deficits. Extremities: Symmetric 5 x 5 power. Skin: No rashes, lesions or ulcers.  Psychiatry: Judgement and insight appear normal. Mood & affect appropriate.    Data Reviewed: I have personally reviewed following labs and imaging studies  CBC: Recent Labs  Lab 10/01/23 0529 10/02/23 0603 10/03/23 0258 10/04/23 0250 10/05/23 0303  WBC 9.3 8.3 8.5 7.8 8.2  NEUTROABS  --   --   --  4.8 5.5  HGB 8.8* 9.1* 9.0* 9.0* 8.7*  HCT 29.6* 29.8* 28.9* 29.3* 27.9*  MCV 95.8 94.3 92.6 92.4 92.1  PLT 223 219 226 227 239   Basic Metabolic Panel: Recent Labs  Lab 10/01/23 0336 10/02/23 0603 10/03/23 0258 10/04/23 0250 10/05/23 0303 10/06/23 0934  NA 128* 134* 134* 133* 136 137  K 5.5* 4.7 4.6 4.1 3.9 3.4*  CL 91* 91* 92* 90* 90* 89*  CO2 25 28 30 29  32 32  GLUCOSE 109* 101* 112* 109* 114* 128*  BUN 72* 83* 89* 93* 87* 73*  CREATININE 4.20* 2.98* 2.41* 2.60* 2.05* 1.64*  CALCIUM 8.3* 9.2 9.0 9.0 9.7 10.4*  MG 2.7*  --   --   --   --   --  GFR: Estimated Creatinine Clearance: 62.4 mL/min (A) (by C-G formula based on SCr of 1.64 mg/dL (H)). Liver Function Tests: Recent Labs  Lab 09/30/23 0315 10/01/23 0336  AST 9* 10*  ALT 12 10  ALKPHOS 79 81  BILITOT 0.7 0.6  PROT 6.8 6.4*  ALBUMIN 3.2* 3.1*   No results for input(s): LIPASE, AMYLASE in the last 168 hours. No results for input(s): AMMONIA in the last 168 hours. Coagulation Profile: No results for input(s): INR, PROTIME in the last 168 hours. Cardiac Enzymes: No results for input(s): CKTOTAL, CKMB, CKMBINDEX, TROPONINI in the last 168 hours. BNP (last 3 results) No results for input(s): PROBNP in the last 8760 hours. HbA1C: No results for input(s): HGBA1C in the last 72 hours. CBG: Recent Labs  Lab 10/06/23 0731  GLUCAP 103*   Lipid Profile: No results for input(s): CHOL, HDL, LDLCALC, TRIG, CHOLHDL, LDLDIRECT in the last 72 hours. Thyroid Function Tests: No results for  input(s): TSH, T4TOTAL, FREET4, T3FREE, THYROIDAB in the last 72 hours. Anemia Panel: No results for input(s): VITAMINB12, FOLATE, FERRITIN, TIBC, IRON, RETICCTPCT in the last 72 hours. Sepsis Labs: No results for input(s): PROCALCITON, LATICACIDVEN in the last 168 hours.   No results found for this or any previous visit (from the past 240 hours).   Radiology Studies: No results found.  Scheduled Meds:  busPIRone   15 mg Oral TID   Chlorhexidine  Gluconate Cloth  6 each Topical Daily   diclofenac  Sodium  2 g Topical QID   doxepin   50 mg Oral Daily   feeding supplement  237 mL Oral BID BM   fluticasone   2 spray Each Nare Daily   fluticasone  furoate-vilanterol  1 puff Inhalation Daily   furosemide   80 mg Intravenous TID   gabapentin   300 mg Oral QHS   heparin  injection (subcutaneous)  5,000 Units Subcutaneous Q8H   ipratropium-albuterol   3 mL Nebulization TID   lidocaine   1 patch Transdermal Q24H   mirtazapine   15 mg Oral QHS   montelukast   10 mg Oral QHS   pantoprazole   40 mg Oral Daily   potassium chloride   40 mEq Oral Q4H   Continuous Infusions:     LOS: 10 days   Fredia Skeeter, MD [facility]  10/06/2023, 10:32 AM   *Please note that this is a verbal dictation therefore any spelling or grammatical errors are due to the Dragon Medical One system interpretation.  Please page via Amion and do not message via secure chat for urgent patient care matters. Secure chat can be used for non urgent patient care matters.  How to contact the [facility] Attending or Consulting provider 7A - 7P or covering provider during after hours 7P -7A, for this patient?  Check the care team in [facility] and look for a) attending/consulting [facility] provider listed and b) the [facility] team listed. Page or secure chat 7A-7P. Log into [email or web address] and use [place] universal password to access. If you do not have the password, please contact the hospital operator. Locate the [facility]  provider you are looking for under [facility] and page to a number that you can be directly reached. If you still have difficulty reaching the provider, please page the [facility] (Director on Call) for the Hospitalists listed on amion for assistance.

## 2023-10-07 DIAGNOSIS — J9622 Acute and chronic respiratory failure with hypercapnia: Secondary | ICD-10-CM | POA: Diagnosis not present

## 2023-10-07 LAB — BASIC METABOLIC PANEL WITH GFR
Anion gap: 14 (ref 5–15)
BUN: 68 mg/dL — ABNORMAL HIGH (ref 8–23)
CO2: 35 mmol/L — ABNORMAL HIGH (ref 22–32)
Calcium: 10 mg/dL (ref 8.9–10.3)
Chloride: 89 mmol/L — ABNORMAL LOW (ref 98–111)
Creatinine, Ser: 1.5 mg/dL — ABNORMAL HIGH (ref 0.44–1.00)
GFR, Estimated: 38 mL/min — ABNORMAL LOW (ref >60)
Glucose, Bld: 118 mg/dL — ABNORMAL HIGH (ref 70–99)
Potassium: 3.6 mmol/L (ref 3.5–5.1)
Sodium: 138 mmol/L (ref 135–145)

## 2023-10-07 MED ORDER — FUROSEMIDE 10 MG/ML IJ SOLN
80.0000 mg | Freq: Two times a day (BID) | INTRAMUSCULAR | Status: DC
  Administered 2023-10-07 – 2023-10-08 (×3): 80 mg via INTRAVENOUS
  Filled 2023-10-07 (×3): qty 8

## 2023-10-07 NOTE — Progress Notes (Signed)
   10/07/23 2223  BiPAP/CPAP/SIPAP  BiPAP/CPAP/SIPAP Pt Type Adult  BiPAP/CPAP/SIPAP DREAMSTATIOND  Mask Type Full face mask  Dentures removed? Not applicable  Mask Size Large  Respiratory Rate 20 breaths/min  Flow Rate 4 lpm  Patient Home Machine No  Patient Home Mask No  Patient Home Tubing No  Auto Titrate Yes  Minimum cmH2O 6 cmH2O  Maximum cmH2O 22 cmH2O  Device Plugged into RED Power Outlet Yes  BiPAP/CPAP /SiPAP Vitals  Resp 17  SpO2 92 %  Bilateral Breath Sounds Diminished  MEWS Score/Color  MEWS Score 0  MEWS Score Color [place]

## 2023-10-07 NOTE — Plan of Care (Signed)
  Problem: Education: Goal: Knowledge of General Education information will improve Description: Including pain rating scale, medication(s)/side effects and non-pharmacologic comfort measures Outcome: Progressing   Problem: Clinical Measurements: Goal: Ability to maintain clinical measurements within normal limits will improve Outcome: Progressing Goal: Will remain free from infection Outcome: Progressing Goal: Diagnostic test results will improve Outcome: Progressing Goal: Respiratory complications will improve Outcome: Progressing Goal: Cardiovascular complication will be avoided Outcome: Progressing   Problem: Nutrition: Goal: Adequate nutrition will be maintained Outcome: Progressing   Problem: Elimination: Goal: Will not experience complications related to bowel motility Outcome: Progressing   Problem: Pain Managment: Goal: General experience of comfort will improve and/or be controlled Outcome: Progressing   Problem: Safety: Goal: Ability to remain free from injury will improve Outcome: Progressing   Problem: Skin Integrity: Goal: Risk for impaired skin integrity will decrease Outcome: Progressing   Problem: Health Behavior/Discharge Planning: Goal: Ability to manage health-related needs will improve Outcome: Not Progressing   Problem: Activity: Goal: Risk for activity intolerance will decrease Outcome: Not Progressing   Problem: Coping: Goal: Level of anxiety will decrease Outcome: Not Progressing   Problem: Elimination: Goal: Will not experience complications related to urinary retention Outcome: Not Progressing

## 2023-10-07 NOTE — Progress Notes (Signed)
 PROGRESS NOTE    Janice Ryan  FMW:[id number] DOB: [birth year] DOA: 09/26/2023 PCP: Lorren Greig PARAS, NP   Brief Narrative:  68 y.o. female with medical history significant for chronic hypoxic hypercapnic respiratory failure-wears 5 L at home, obstructive sleep apnea, moderate persistent asthma, essential hypertension, generalized anxiety disorder, allergic rhinitis, who is admitted to [facility] on 09/26/2023 with acute on chronic hypoxic hypercapnic respiratory failure after presenting from home to [facility] ED complaining of right hip pain.  This happened after tripping and falling at home on 6/24.  Following the fall she had right hip discomfort radiating into the right groin every time she attempted to bear weight.  Hospital stay complicated by worsening kidney function due to volume overload   Assessment & Plan:   Principal Problem:   Acute on chronic respiratory failure with hypercapnia (HCC) Active Problems:   Essential hypertension   Moderate persistent asthma   GAD (generalized anxiety disorder)   Morbid obesity (HCC)   Acute right hip pain   Low back pain   Acute encephalopathy   Allergic rhinitis  Acute on chronic hypoxic and hypercarbic respiratory failure due to combination of obesity hypoventilation syndrome and acute on chronic congestive heart failure with preserved ejection fraction-initial chest x-ray completely normal however due to AKI, she was given fluids which caused volume overload/acute on chronic congestive heart failure with preserved ejection fraction as echo showed normal EF.  She was then started on diuresis.  Has had significant diuresis, per chart she is down at least 14.5 L but ins and outs are not accurate, her edema has improved significantly.  I will now reduce diuretics from 80 mg IV Lasix  3 times daily to twice daily today.  She is now requiring 4 to 5 L vaginal which is her baseline.   Acute kidney injury -patient's creatinine on presentation  was 1.2.  She has some intermittent low blood pressure reads but she is normotensive now for most part.  Patient's creatinine started to climb and peaked at 4.2 on 10/01/2023.  Nephrology consulted, started on diuresis, creatinine improved to 2.06 but jumped to 2.6 on 10/04/2023, held diuretics same day, creatinine improved to 2.06, resumed diuretics on 10/05/2023, creatinine continues to improve and down to 1.5 today.  Hypotension: Recent blood pressure 88/40.  Patient asymptomatic.  She is not on any antihypertensive medications either.   Hyperkalemia-resolved after Lokelma .  Was secondary to AKI.  Hypokalemia: Replenished and resolved.  Mild hyponatremia: Resolved.   Acute toxic/metabolic encephalopathy-became somnolent after multiple doses of Dilaudid  and Ativan  in the ED.  Now resolved -avoid IV narcotics   Right hip pain-imaging with CT did not show any evidence of fractures.  Pain control, physical therapy  Chronic normocytic anemia: Hemoglobin stable.   Chronic low back pain -slightly worse now after the fall.  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  Continue supportive care   Morbid obesity class III-BMI 63.  She would benefit from weight loss and dietary modification which is counseled.  Depression/anxiety: Patient upset about not getting her medications for these for last 10 days.  She thinks that she is now having withdrawal although appears to be very calm and composed.  I have resumed all her home medications.  Disposition: PT OT recommended LTAC.  I did peer to peer with the insurance company on 10/03/2023.  LTAC was denied.  LTAC appealing.  However on the morning of 10/05/2023,  patient told me that she is not interested in going to LTAC and instead wants to go to SNF.  I informed TOC the same day and requested to start the process.  DVT prophylaxis: heparin  injection 5,000 Units Start:  09/30/23 2200 SCDs Start: 09/26/23 2031   Code Status: Full Code  Family Communication:  None present at bedside.  Plan of care discussed with patient in length and he/she verbalized understanding and agreed with it.  Status is: Inpatient Remains inpatient appropriate because: She is now medically stable.  Pending placement.  Estimated body mass index is 66.1 kg/m as calculated from the following:   Height as of this encounter: 5' 8 (1.727 m).   Weight as of this encounter: 197.2 kg.    Nutritional Assessment: Body mass index is 66.1 kg/m.[name] Seen by dietician.  I agree with the assessment and plan as outlined below: Nutrition Status:        . Skin Assessment: I have examined the patient's skin and I agree with the wound assessment as performed by the wound care RN as outlined below:    Consultants:  Nephrology  Procedures:  As above  Antimicrobials:  Anti-infectives (From admission, onward)    None         Subjective: Patient seen and examined.  Patient was in deep sleep but arousable.  Did not have any complaint.  Appeared comfortable.  Kept falling asleep while having conversation.  Objective: Vitals:   10/07/23 0057 10/07/23 0500 10/07/23 0550 10/07/23 0605  BP:   (!) 88/40   Pulse:   88   Resp: 19  20   Temp:   97.9 F (36.6 C)   TempSrc:      SpO2:   93% (!) 89%  Weight:  (!) 197.2 kg    Height:        Intake/Output Summary (Last 24 hours) at 10/07/2023 0755 Last data filed at 10/07/2023 0553 Gross per 24 hour  Intake 940 ml  Output 7800 ml  Net -6860 ml   Filed Weights   10/04/23 0500 10/05/23 0500 10/07/23 0500  Weight: (!) 196.7 kg (!) 197.2 kg (!) 197.2 kg    Examination:  General exam: Appears calm and comfortable but very sleepy, obese Respiratory system: Clear to auscultation with diminished breath sounds due to body habitus. Respiratory effort normal. Cardiovascular system: S1 & S2 heard, RRR. No JVD, murmurs, rubs, gallops or  clicks.  Trace pitting edema bilateral lower extremity Gastrointestinal system: Abdomen is nondistended, soft and nontender. No organomegaly or masses felt. Normal bowel sounds heard. Central nervous system: Sleepy.  No focal deficit. Extremities: Symmetric 5 x 5 power. Skin: No rashes, lesions or ulcers.    Data Reviewed: I have personally reviewed following labs and imaging studies  CBC: Recent Labs  Lab 10/01/23 0529 10/02/23 0603 10/03/23 0258 10/04/23 0250 10/05/23 0303  WBC 9.3 8.3 8.5 7.8 8.2  NEUTROABS  --   --   --  4.8 5.5  HGB 8.8* 9.1* 9.0* 9.0* 8.7*  HCT 29.6* 29.8* 28.9* 29.3* 27.9*  MCV 95.8 94.3 92.6 92.4 92.1  PLT 223 219 226 227 239   Basic Metabolic Panel: Recent Labs  Lab 10/01/23 0336 10/02/23 0603 10/03/23 0258 10/04/23 0250 10/05/23 0303 10/06/23 0934 10/07/23 0557  NA 128*   < > 134* 133* 136 137 138  K 5.5*   < > 4.6 4.1 3.9 3.4* 3.6  CL 91*   < > 92* 90* 90* 89* 89*  CO2 25   < >  30 29 32 32 35*  GLUCOSE 109*   < > 112* 109* 114* 128* 118*  BUN 72*   < > 89* 93* 87* 73* 68*  CREATININE 4.20*   < > 2.41* 2.60* 2.05* 1.64* 1.50*  CALCIUM 8.3*   < > 9.0 9.0 9.7 10.4* 10.0  MG 2.7*  --   --   --   --   --   --    < > = values in this interval not displayed.   GFR: Estimated Creatinine Clearance: 68.3 mL/min (A) (by C-G formula based on SCr of 1.5 mg/dL (H)). Liver Function Tests: Recent Labs  Lab 10/01/23 0336  AST 10*  ALT 10  ALKPHOS 81  BILITOT 0.6  PROT 6.4*  ALBUMIN 3.1*   No results for input(s): LIPASE, AMYLASE in the last 168 hours. No results for input(s): AMMONIA in the last 168 hours. Coagulation Profile: No results for input(s): INR, PROTIME in the last 168 hours. Cardiac Enzymes: No results for input(s): CKTOTAL, CKMB, CKMBINDEX, TROPONINI in the last 168 hours. BNP (last 3 results) No results for input(s): PROBNP in the last 8760 hours. HbA1C: No results for input(s): HGBA1C in the last 72  hours. CBG: Recent Labs  Lab 10/06/23 0731  GLUCAP 103*   Lipid Profile: No results for input(s): CHOL, HDL, LDLCALC, TRIG, CHOLHDL, LDLDIRECT in the last 72 hours. Thyroid Function Tests: No results for input(s): TSH, T4TOTAL, FREET4, T3FREE, THYROIDAB in the last 72 hours. Anemia Panel: No results for input(s): VITAMINB12, FOLATE, FERRITIN, TIBC, IRON, RETICCTPCT in the last 72 hours. Sepsis Labs: No results for input(s): PROCALCITON, LATICACIDVEN in the last 168 hours.   No results found for this or any previous visit (from the past 240 hours).   Radiology Studies: No results found.  Scheduled Meds:  busPIRone   15 mg Oral TID   Chlorhexidine  Gluconate Cloth  6 each Topical Daily   diclofenac  Sodium  2 g Topical QID   doxepin   50 mg Oral Daily   fluticasone   2 spray Each Nare Daily   fluticasone  furoate-vilanterol  1 puff Inhalation Daily   furosemide   80 mg Intravenous TID   gabapentin   300 mg Oral QHS   heparin  injection (subcutaneous)  5,000 Units Subcutaneous Q8H   ipratropium-albuterol   3 mL Nebulization TID   lidocaine   1 patch Transdermal Q24H   mirtazapine   15 mg Oral QHS   montelukast   10 mg Oral QHS   pantoprazole   40 mg Oral Daily   Continuous Infusions:     LOS: 11 days   Fredia Skeeter, MD [facility]  10/07/2023, 7:55 AM   *Please note that this is a verbal dictation therefore any spelling or grammatical errors are due to the Dragon Medical One system interpretation.  Please page via Amion and do not message via secure chat for urgent patient care matters. Secure chat can be used for non urgent patient care matters.  How to contact the [facility] Attending or Consulting provider 7A - 7P or covering provider during after hours 7P -7A, for this patient?  Check the care team in [facility] and look for a) attending/consulting [facility] provider listed and b) the [facility] team listed. Page or secure chat 7A-7P. Log into [email or web address]  and use 's universal password to access. If you do not have the password, please contact the hospital operator. Locate the [facility] provider you are looking for under [facility] and page to a number that you can be directly reached. If  you still have difficulty reaching the provider, please page the [facility] (Director on Call) for the Hospitalists listed on amion for assistance.

## 2023-10-08 DIAGNOSIS — J9622 Acute and chronic respiratory failure with hypercapnia: Secondary | ICD-10-CM | POA: Diagnosis not present

## 2023-10-08 LAB — CBC WITH DIFFERENTIAL/PLATELET
Abs Immature Granulocytes: 0.07 K/uL (ref 0.00–0.07)
Basophils Absolute: 0.1 K/uL (ref 0.0–0.1)
Basophils Relative: 1 %
Eosinophils Absolute: 0.1 K/uL (ref 0.0–0.5)
Eosinophils Relative: 1 %
HCT: 30.9 % — ABNORMAL LOW (ref 36.0–46.0)
Hemoglobin: 9.5 g/dL — ABNORMAL LOW (ref 12.0–15.0)
Immature Granulocytes: 1 %
Lymphocytes Relative: 30 %
Lymphs Abs: 2.5 K/uL (ref 0.7–4.0)
MCH: 28.6 pg (ref 26.0–34.0)
MCHC: 30.7 g/dL (ref 30.0–36.0)
MCV: 93.1 fL (ref 80.0–100.0)
Monocytes Absolute: 0.6 K/uL (ref 0.1–1.0)
Monocytes Relative: 8 %
Neutro Abs: 4.9 K/uL (ref 1.7–7.7)
Neutrophils Relative %: 59 %
Platelets: 296 K/uL (ref 150–400)
RBC: 3.32 MIL/uL — ABNORMAL LOW (ref 3.87–5.11)
RDW: 12.3 % (ref 11.5–15.5)
WBC: 8.3 K/uL (ref 4.0–10.5)
nRBC: 0 % (ref 0.0–0.2)

## 2023-10-08 LAB — BASIC METABOLIC PANEL WITH GFR
Anion gap: 13 (ref 5–15)
BUN: 58 mg/dL — ABNORMAL HIGH (ref 8–23)
CO2: 37 mmol/L — ABNORMAL HIGH (ref 22–32)
Calcium: 10 mg/dL (ref 8.9–10.3)
Chloride: 88 mmol/L — ABNORMAL LOW (ref 98–111)
Creatinine, Ser: 1.59 mg/dL — ABNORMAL HIGH (ref 0.44–1.00)
GFR, Estimated: 36 mL/min — ABNORMAL LOW (ref >60)
Glucose, Bld: 136 mg/dL — ABNORMAL HIGH (ref 70–99)
Potassium: 3.6 mmol/L (ref 3.5–5.1)
Sodium: 138 mmol/L (ref 135–145)

## 2023-10-08 LAB — MAGNESIUM: Magnesium: 2.2 mg/dL (ref 1.7–2.4)

## 2023-10-08 MED ORDER — FUROSEMIDE 10 MG/ML IJ SOLN
60.0000 mg | Freq: Two times a day (BID) | INTRAMUSCULAR | Status: DC
  Administered 2023-10-08 – 2023-10-09 (×2): 60 mg via INTRAVENOUS
  Filled 2023-10-08 (×2): qty 6

## 2023-10-08 NOTE — TOC Progression Note (Signed)
 Transition of Care [facility]) - Progression Note    Patient Details  Name: Janice Ryan MRN: [id number] Date of Birth: 01-Mar-1956  Transition of Care [facility]) CM/SW Contact  Sheri ONEIDA [place], [place] Phone Number: 10/08/2023, 1:00 PM  Clinical Narrative:    CSW reviewed bed offers with pt. Bed choice is [facility]. Ins auth started and approved. CSW notified Starr at [facility]. Plan to dc to SNF when bed is ready and pt is medically ready for dc.    Expected Discharge Plan: Home w Home Health Services Barriers to Discharge: Continued Medical Work up  Expected Discharge Plan and Services In-house Referral: Clinical Social Work Discharge Planning Services: CM Consult Post Acute Care Choice: Durable Medical Equipment, Home Health Living arrangements for the past 2 months: Apartment                 DME Arranged: N/A DME Agency: [employer] Date DME Agency Contacted: 09/27/23 Time DME Agency Contacted: 1440 Representative spoke with at DME Agency: Mitch with [employer]             Social Determinants of Health (SDOH) Interventions SDOH Screenings   Food Insecurity: No Food Insecurity (09/27/2023)  Housing: Low Risk  (09/27/2023)  Transportation Needs: No Transportation Needs (09/27/2023)  Utilities: Not At Risk (09/27/2023)  Depression (PHQ2-9): Low Risk  (07/06/2021)  Social Connections: Socially Integrated (09/27/2023)  Tobacco Use: Low Risk  (09/27/2023)    Readmission Risk Interventions    09/27/2023    2:36 PM 03/06/2023   11:10 AM 02/08/2023    4:56 PM  Readmission Risk Prevention Plan  Post Dischage Appt   Complete  Medication Screening   Complete  Transportation Screening Complete Complete Complete  PCP or Specialist Appt within 5-7 Days Complete Complete   Home Care Screening Complete Complete   Medication Review (RN CM) Complete Complete

## 2023-10-08 NOTE — Progress Notes (Signed)
 Physical Therapy Treatment Patient Details Name: Janice Ryan MRN: [id number] DOB: 1955-04-18 Today's Date: 10/08/2023   History of Present Illness Janice Ryan is a 68 y.o. female with medical history significant for chronic hypoxic hypercapnic respiratory failure, obstructive sleep apnea, moderate persistent asthma, essential hypertension, generalized anxiety disorder, allergic rhinitis, who is admitted to [facility] on 09/26/2023 with acute on chronic hypoxic hypercapnic respiratory failure after presenting from home to [facility] ED complaining of right hip pain.    PT Comments  Pt reporting discomfort initially while supine in bed, tolerates supine exercises with reports of improving discomfort. Pt able to power to stand and step pivot to [facility] with EVA walker, min A+2 for safety, pt with very slow steps, wide BOS, R KI donned without knee buckling noted. Pt on BSC at EOS with call button, requests therapist exits room and will call when ready to get up. Plan to continue progressing mobility as able.    If plan is discharge home, recommend the following: Two people to help with walking and/or transfers;A lot of help with bathing/dressing/bathroom;Assistance with cooking/housework;Assist for transportation;Help with stairs or ramp for entrance   Can travel by private vehicle     No  Equipment Recommendations  Rolling walker (2 wheels) (bari RW)    Recommendations for Other Services       Precautions / Restrictions Precautions Precautions: Fall Required Braces or Orthoses: Knee Immobilizer - Right Knee Immobilizer - Right: On when out of bed or walking Restrictions Weight Bearing Restrictions Per Provider Order: No     Mobility  Bed Mobility Overal bed mobility: Needs Assistance Bed Mobility: Supine to Sit     Supine to sit: HOB elevated, Used rails, Mod assist     General bed mobility comments: pt pulling strongly on hand to upright trunk into sitting, other  hand strongly pulling on bedrail, HOB elevated    Transfers Overall transfer level: Needs assistance Equipment used: Ambulation equipment used Transfers: Sit to/from Stand, Bed to chair/wheelchair/BSC Sit to Stand: Min assist, +2 physical assistance, +2 safety/equipment   Step pivot transfers: Min assist, +2 physical assistance, +2 safety/equipment       General transfer comment: min A +2 for safety and assist to power up from bed, pt pulling from locked EVA walker, slow to power up, cues to shift weight forward, total A to don R KI while pt supine in bed; min A+2 for safety to step pivot to BSC from EOB    Ambulation/Gait                   Stairs             Wheelchair Mobility     Tilt Bed    Modified Rankin (Stroke Patients Only)       Balance Overall balance assessment: History of Falls, Needs assistance Sitting-balance support: Feet supported, Bilateral upper extremity supported Sitting balance-Leahy Scale: Fair     Standing balance support: Bilateral upper extremity supported, During functional activity, Reliant on assistive device for balance Standing balance-Leahy Scale: Poor Standing balance comment: R KI donned, BUE assist on EVA walker                            Communication Communication Communication: No apparent difficulties  Cognition Arousal: Alert Behavior During Therapy: WFL for tasks assessed/performed   PT - Cognitive impairments: No apparent impairments  Following commands: Intact      Cueing Cueing Techniques: Verbal cues  Exercises General Exercises - Lower Extremity Ankle Circles/Pumps: AROM, Right, 15 reps, Supine Quad Sets: AROM, Right, 5 reps, Supine Heel Slides: AROM, Right, 5 reps, Supine Straight Leg Raises: AROM, Right, 5 reps, Supine (unable to clear leg from mattress)    General Comments        Pertinent Vitals/Pain Pain Assessment Pain Assessment:  Faces Faces Pain Scale: Hurts little more Pain Location: generalized with movement Pain Descriptors / Indicators: Constant, Discomfort Pain Intervention(s): Limited activity within patient's tolerance, Monitored during session, Repositioned    Home Living                          Prior Function            PT Goals (current goals can now be found in the care plan section) Acute Rehab PT Goals Patient Stated Goal: to be able to get stronger, loose weight and have R knee surgery PT Goal Formulation: With patient Time For Goal Achievement: 10/11/23 Potential to Achieve Goals: Good Progress towards PT goals: Progressing toward goals    Frequency    Min 2X/week      PT Plan      Co-evaluation              AM-PAC PT 6 Clicks Mobility   Outcome Measure  Help needed turning from your back to your side while in a flat bed without using bedrails?: A Little Help needed moving from lying on your back to sitting on the side of a flat bed without using bedrails?: A Little Help needed moving to and from a bed to a chair (including a wheelchair)?: A Little Help needed standing up from a chair using your arms (e.g., wheelchair or bedside chair)?: A Lot Help needed to walk in hospital room?: Total Help needed climbing 3-5 steps with a railing? : Total 6 Click Score: 13    End of Session Equipment Utilized During Treatment: Gait belt;Right knee immobilizer;Oxygen Activity Tolerance: Patient tolerated treatment well Patient left: with call bell/phone within reach;Other (comment) (on [facility]) Nurse Communication: Mobility status PT Visit Diagnosis: Unsteadiness on feet (R26.81);Other abnormalities of gait and mobility (R26.89);Repeated falls (R29.6);Muscle weakness (generalized) (M62.81);Difficulty in walking, not elsewhere classified (R26.2);Pain Pain - Right/Left: Right Pain - part of body: Knee;Leg     Time: 8983-8956 PT Time Calculation (min) (ACUTE ONLY): 27  min  Charges:    $Therapeutic Exercise: 8-22 mins $Therapeutic Activity: 8-22 mins PT General Charges $$ ACUTE PT VISIT: 1 Visit                     Tori Everlie Eble PT, DPT 10/08/23, 10:56 AM

## 2023-10-08 NOTE — Progress Notes (Signed)
 PROGRESS NOTE    Janice Ryan  FMW:[id number] DOB: 10-24-1955 DOA: 09/26/2023 PCP: Lorren Greig PARAS, NP   Brief Narrative:  68 y.o. female with medical history significant for chronic hypoxic hypercapnic respiratory failure-wears 5 L at home, obstructive sleep apnea, moderate persistent asthma, essential hypertension, generalized anxiety disorder, allergic rhinitis, who is admitted to [facility] on 09/26/2023 with acute on chronic hypoxic hypercapnic respiratory failure after presenting from home to [facility] ED complaining of right hip pain.  This happened after tripping and falling at home on 6/24.  Following the fall she had right hip discomfort radiating into the right groin every time she attempted to bear weight.  Hospital stay complicated by worsening kidney function due to volume overload   Assessment & Plan:   Principal Problem:   Acute on chronic respiratory failure with hypercapnia (HCC) Active Problems:   Essential hypertension   Moderate persistent asthma   GAD (generalized anxiety disorder)   Morbid obesity (HCC)   Acute right hip pain   Low back pain   Acute encephalopathy   Allergic rhinitis  Acute on chronic hypoxic and hypercarbic respiratory failure due to combination of obesity hypoventilation syndrome and acute on chronic congestive heart failure with preserved ejection fraction-initial chest x-ray completely normal however due to AKI, she was given fluids which caused volume overload/acute on chronic congestive heart failure with preserved ejection fraction as echo showed normal EF.  She was then started on diuresis.  Has had significant diuresis, per chart she is down at least 16.5 L but ins and outs are not accurate, her edema has improved significantly.  I will now reduce Lasix  further down to 60 mg twice daily.  She is on 4 L of oxygen which is better than her baseline which was 5 L.   Acute kidney injury -patient's creatinine on presentation was 1.2.  She  has some intermittent low blood pressure reads but she is normotensive now for most part.  Patient's creatinine started to climb and peaked at 4.2 on 10/01/2023.  Nephrology consulted, started on diuresis, creatinine improved to 2.06 but jumped to 2.6 on 10/04/2023, held diuretics same day, creatinine improved to 2.06, resumed diuretics on 10/05/2023, creatinine continues to improve and down to 1.5 today, pending labs today.  Hypotension: Recent blood pressure 88/40.  Patient asymptomatic.  She is not on any antihypertensive medications either.   Hyperkalemia-resolved after Lokelma .  Was secondary to AKI.  Hypokalemia: Replenished and resolved.  Mild hyponatremia: Resolved.   Acute toxic/metabolic encephalopathy-became somnolent after multiple doses of Dilaudid  and Ativan  in the ED.  Now resolved -avoid IV narcotics   Right hip pain-imaging with CT did not show any evidence of fractures.  Pain control, physical therapy  Chronic normocytic anemia: Hemoglobin stable.   Chronic low back pain -slightly worse now after the fall.  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  Continue supportive care   Morbid obesity class III-BMI 63.  She would benefit from weight loss and dietary modification which is counseled.  Depression/anxiety: Patient upset about not getting her medications for these for last 10 days.  She thinks that she is now having withdrawal although appears to be very calm and composed.  I have resumed all her home medications.  Disposition: PT OT recommended LTAC.  I did peer to peer with the insurance company on 10/03/2023.  LTAC was denied.  LTAC appealing.  However on the morning  of 10/05/2023, patient told me that she is not interested in going to LTAC and instead wants to go to SNF.  I informed TOC the same day and requested to start the process.  DVT prophylaxis: heparin  injection 5,000 Units  Start: 09/30/23 2200 SCDs Start: 09/26/23 2031   Code Status: Full Code  Family Communication:  None present at bedside.  Plan of care discussed with patient in length and he/she verbalized understanding and agreed with it.  Status is: Inpatient Remains inpatient appropriate because: She is now medically stable.  Pending placement.  Estimated body mass index is 66.1 kg/m as calculated from the following:   Height as of this encounter: 5' 8 (1.727 m).   Weight as of this encounter: 197.2 kg.    Nutritional Assessment: Body mass index is 66.1 kg/m.[name] Seen by dietician.  I agree with the assessment and plan as outlined below: Nutrition Status:        . Skin Assessment: I have examined the patient's skin and I agree with the wound assessment as performed by the wound care RN as outlined below:    Consultants:  Nephrology  Procedures:  As above  Antimicrobials:  Anti-infectives (From admission, onward)    None         Subjective: Patient seen and examined.  She has no complaints.  Objective: Vitals:   10/07/23 1325 10/07/23 2027 10/07/23 2223 10/08/23 0500  BP: (!) 143/66 (!) 140/75  (!) 154/90  Pulse: 94 95  87  Resp: 18  17 20   Temp: 97.9 F (36.6 C) 98.2 F (36.8 C)  98.3 F (36.8 C)  TempSrc:  Oral    SpO2: 96% 98% 92% 96%  Weight:      Height:        Intake/Output Summary (Last 24 hours) at 10/08/2023 0825 Last data filed at 10/08/2023 0019 Gross per 24 hour  Intake 1080 ml  Output 3150 ml  Net -2070 ml   Filed Weights   10/04/23 0500 10/05/23 0500 10/07/23 0500  Weight: (!) 196.7 kg (!) 197.2 kg (!) 197.2 kg    Examination:  General exam: Appears calm and comfortable, morbidly obese Respiratory system: Clear to auscultation. Respiratory effort normal. Cardiovascular system: S1 & S2 heard, RRR. No JVD, murmurs, rubs, gallops or clicks. No pedal edema. Gastrointestinal system: Abdomen is nondistended, soft and nontender. No organomegaly or  masses felt. Normal bowel sounds heard. Central nervous system: Alert and oriented. No focal neurological deficits. Extremities: Symmetric 5 x 5 power. Skin: No rashes, lesions or ulcers.  Psychiatry: Judgement and insight appear normal. Mood & affect appropriate.   Data Reviewed: I have personally reviewed following labs and imaging studies  CBC: Recent Labs  Lab 10/02/23 0603 10/03/23 0258 10/04/23 0250 10/05/23 0303  WBC 8.3 8.5 7.8 8.2  NEUTROABS  --   --  4.8 5.5  HGB 9.1* 9.0* 9.0* 8.7*  HCT 29.8* 28.9* 29.3* 27.9*  MCV 94.3 92.6 92.4 92.1  PLT 219 226 227 239   Basic Metabolic Panel: Recent Labs  Lab 10/03/23 0258 10/04/23 0250 10/05/23 0303 10/06/23 0934 10/07/23 0557  NA 134* 133* 136 137 138  K 4.6 4.1 3.9 3.4* 3.6  CL 92* 90* 90* 89* 89*  CO2 30 29 32 32 35*  GLUCOSE 112* 109* 114* 128* 118*  BUN 89* 93* 87* 73* 68*  CREATININE 2.41* 2.60* 2.05* 1.64* 1.50*  CALCIUM 9.0 9.0 9.7 10.4* 10.0   GFR: Estimated Creatinine Clearance: 68.3 mL/min (A) (by C-G  formula based on SCr of 1.5 mg/dL (H)). Liver Function Tests: No results for input(s): AST, ALT, ALKPHOS, BILITOT, PROT, ALBUMIN in the last 168 hours.  No results for input(s): LIPASE, AMYLASE in the last 168 hours. No results for input(s): AMMONIA in the last 168 hours. Coagulation Profile: No results for input(s): INR, PROTIME in the last 168 hours. Cardiac Enzymes: No results for input(s): CKTOTAL, CKMB, CKMBINDEX, TROPONINI in the last 168 hours. BNP (last 3 results) No results for input(s): PROBNP in the last 8760 hours. HbA1C: No results for input(s): HGBA1C in the last 72 hours. CBG: Recent Labs  Lab 10/06/23 0731  GLUCAP 103*   Lipid Profile: No results for input(s): CHOL, HDL, LDLCALC, TRIG, CHOLHDL, LDLDIRECT in the last 72 hours. Thyroid Function Tests: No results for input(s): TSH, T4TOTAL, FREET4, T3FREE, THYROIDAB in the last  72 hours. Anemia Panel: No results for input(s): VITAMINB12, FOLATE, FERRITIN, TIBC, IRON, RETICCTPCT in the last 72 hours. Sepsis Labs: No results for input(s): PROCALCITON, LATICACIDVEN in the last 168 hours.   No results found for this or any previous visit (from the past 240 hours).   Radiology Studies: No results found.  Scheduled Meds:  busPIRone   15 mg Oral TID   Chlorhexidine  Gluconate Cloth  6 each Topical Daily   diclofenac  Sodium  2 g Topical QID   doxepin   50 mg Oral Daily   fluticasone   2 spray Each Nare Daily   fluticasone  furoate-vilanterol  1 puff Inhalation Daily   furosemide   80 mg Intravenous BID   gabapentin   300 mg Oral QHS   heparin  injection (subcutaneous)  5,000 Units Subcutaneous Q8H   ipratropium-albuterol   3 mL Nebulization TID   lidocaine   1 patch Transdermal Q24H   mirtazapine   15 mg Oral QHS   montelukast   10 mg Oral QHS   pantoprazole   40 mg Oral Daily   Continuous Infusions:     LOS: 12 days   Fredia Skeeter, MD [facility]  10/08/2023, 8:25 AM   *Please note that this is a verbal dictation therefore any spelling or grammatical errors are due to the Dragon Medical One system interpretation.  Please page via Amion and do not message via secure chat for urgent patient care matters. Secure chat can be used for non urgent patient care matters.  How to contact the [facility] Attending or Consulting provider 7A - 7P or covering provider during after hours 7P -7A, for this patient?  Check the care team in [facility] and look for a) attending/consulting TRH provider listed and b) the TR[facility] team listed. Page or secure chat 7A-7P. Log into [email or web address] and use [place]'s universal password to access. If you do not have the password, please contact the hospital operator. Locate the TR[facility] provider you are looking for under [facility] and page to a number that you can be directly reached. If you still have difficulty reaching the  provider, please page the [facility] (Director on Call) for the Hospitalists listed on amion for assistance.

## 2023-10-09 DIAGNOSIS — J9622 Acute and chronic respiratory failure with hypercapnia: Secondary | ICD-10-CM | POA: Diagnosis not present

## 2023-10-09 LAB — BASIC METABOLIC PANEL WITH GFR
Anion gap: 13 (ref 5–15)
BUN: 60 mg/dL — ABNORMAL HIGH (ref 8–23)
CO2: 37 mmol/L — ABNORMAL HIGH (ref 22–32)
Calcium: 10.1 mg/dL (ref 8.9–10.3)
Chloride: 88 mmol/L — ABNORMAL LOW (ref 98–111)
Creatinine, Ser: 1.6 mg/dL — ABNORMAL HIGH (ref 0.44–1.00)
GFR, Estimated: 35 mL/min — ABNORMAL LOW (ref >60)
Glucose, Bld: 111 mg/dL — ABNORMAL HIGH (ref 70–99)
Potassium: 3.3 mmol/L — ABNORMAL LOW (ref 3.5–5.1)
Sodium: 138 mmol/L (ref 135–145)

## 2023-10-09 MED ORDER — POTASSIUM CHLORIDE CRYS ER 10 MEQ PO TBCR: 10.0000 meq | EXTENDED_RELEASE_TABLET | Freq: Every day | ORAL | 0 refills | Status: DC

## 2023-10-09 MED ORDER — POTASSIUM CHLORIDE CRYS ER 20 MEQ PO TBCR
40.0000 meq | EXTENDED_RELEASE_TABLET | ORAL | Status: DC
  Administered 2023-10-09: 40 meq via ORAL
  Filled 2023-10-09: qty 2

## 2023-10-09 MED ORDER — FUROSEMIDE 40 MG PO TABS: 40.0000 mg | ORAL_TABLET | Freq: Every day | ORAL | 0 refills | Status: DC

## 2023-10-09 MED ORDER — OXYCODONE HCL 5 MG PO TABS: 5.0000 mg | ORAL_TABLET | Freq: Four times a day (QID) | ORAL | 0 refills | Status: DC | PRN

## 2023-10-09 NOTE — Care Management Important Message (Signed)
 Important Message  Patient Details IM Letter given. Name: Janice Ryan MRN: [id number] Date of Birth: 10-26-1955   Important Message Given:  Yes - Medicare IM     [name] 10/09/2023, 12:20 PM

## 2023-10-09 NOTE — Progress Notes (Signed)
 Occupational Therapy Treatment Patient Details Name: Kenlee Vogt MRN: [id number] DOB: [birth year] Today's Date: 10/09/2023   History of present illness Skyrah Krupp is a 68 yr old female with medical history significant for chronic hypoxic hypercapnic respiratory failure, obstructive sleep apnea, moderate persistent asthma, essential hypertension, generalized anxiety disorder, allergic rhinitis, who is admitted to [facility] on 09/26/2023 with acute on chronic hypoxic hypercapnic respiratory failure after presenting from home to [facility] ED complaining of right hip pain.   OT comments  The pt was seen for ADL instruction, functional strengthening and progression of functional activity. She required mod assist for supine to sit. Once seated EOB, she presented with fair+ sitting balance. She subsequently required set-up assist for upper body grooming. She reported pain in her R knee with activity. She was returned to bed at the end of the session. Continue OT plan of care. Patient will benefit from continued inpatient follow up therapy, <3 hours/day.      If plan is discharge home, recommend the following:  A lot of help with bathing/dressing/bathroom;Direct supervision/assist for medications management;Assistance with cooking/housework;Assist for transportation;Help with stairs or ramp for entrance;A lot of help with walking and/or transfers   Equipment Recommendations  Tub/shower bench    Recommendations for Other Services      Precautions / Restrictions Precautions Precautions: Fall Required Braces or Orthoses: Knee Immobilizer - Right Knee Immobilizer - Right: On when out of bed or walking Restrictions Weight Bearing Restrictions Per Provider Order: No       Mobility Bed Mobility Overal bed mobility: Needs Assistance Bed Mobility: Supine to Sit, Sit to Supine     Supine to sit: HOB elevated, Used rails, Mod assist Sit to supine: Mod assist            Balance     Sitting balance-Leahy Scale: Fair            ADL either performed or assessed with clinical judgement   ADL Overall ADL's : Needs assistance/impaired     Grooming: Set up;Supervision/safety;Sitting Grooming Details (indicate cue type and reason): She performed face washing, hand washing, and teeth brushing seated EOB.             Lower Body Dressing: Maximal assistance;With adaptive equipment Lower Body Dressing Details (indicate cue type and reason): The pt reported having difficulty with performing lower body dressing tasks. She was subsequently educated on the use of a reacher to doff socks, sock aid to don socks, and reacher to don lower body clothing articles.                              Communication Communication Communication: No apparent difficulties   Cognition Arousal: Alert Behavior During Therapy: WFL for tasks assessed/performed         Memory impairment (select all impairments): Short-term memory      Following commands: Intact                      Pertinent Vitals/ Pain       Pain Assessment Pain Assessment: Faces Pain Score: 4  Pain Location: RLE with movement Pain Intervention(s): Limited activity within patient's tolerance, Monitored during session   Frequency  Min 2X/week        Progress Toward Goals  OT Goals(current goals can now be found in the care plan section)  Progress towards OT goals: Progressing toward goals  Acute Rehab OT Goals OT Goal  Formulation: With patient Time For Goal Achievement: 10/11/23 Potential to Achieve Goals: Good  Plan         AM-PAC OT 6 Clicks Daily Activity     Outcome Measure   Help from another person eating meals?: None Help from another person taking care of personal grooming?: A Little Help from another person toileting, which includes using toliet, bedpan, or urinal?: A Lot Help from another person bathing (including washing, rinsing, drying)?: A  Lot Help from another person to put on and taking off regular upper body clothing?: A Little Help from another person to put on and taking off regular lower body clothing?: A Lot 6 Click Score: 16    End of Session Equipment Utilized During Treatment: Oxygen  OT Visit Diagnosis: Unsteadiness on feet (R26.81);Other abnormalities of gait and mobility (R26.89);Muscle weakness (generalized) (M62.81);History of falling (Z91.81);Pain Pain - Right/Left: Right Pain - part of body: Knee   Activity Tolerance Patient tolerated treatment well   Patient Left in bed;with call bell/phone within reach;with bed alarm set   Nurse Communication Mobility status        Time: 1005-1040 OT Time Calculation (min): 35 min  Charges: OT General Charges $OT Visit: 1 Visit OT Treatments $Self Care/Home Management : 8-22 mins $Therapeutic Activity: 8-22 mins     Delanna LITTIE Molt, OTR/L 10/09/2023, 1:54 PM

## 2023-10-09 NOTE — TOC Transition Note (Signed)
 Transition of Care [facility]) - Discharge Note   Patient Details  Name: Janice Ryan MRN: [id number] Date of Birth: 1956-01-01  Transition of [facility]) CM/SW Contact:  Sheri ONEIDA Sharps, LCSW Phone Number: 10/09/2023, 10:56 AM   Clinical Narrative:    Pt medically ready to dc to SNF. Room 208P call report [phone] provided to nurse. DC packet let at nurses station. PTAR called. No further TOC needs.    Final next level of care: Skilled Nursing Facility Barriers to Discharge: Barriers Resolved   Patient Goals and CMS Choice Patient states their goals for this hospitalization and ongoing recovery are:: return home following STR CMS Medicare.gov Compare Post Acute Care list provided to:: Patient Choice offered to / list presented to : Patient [place] ownership interest in [facility].provided to:: Patient    Discharge Placement   Existing PASRR number confirmed : 10/05/23          Patient chooses bed at: [facility] Patient to be transferred to facility by: PTAR   Patient and family notified of of transfer: 10/09/23  Discharge Plan and Services Additional resources added to the After Visit Summary for   In-house Referral: Clinical Social Work Discharge Planning Services: CM Consult Post Acute Care Choice: Durable Medical Equipment, Home Health          DME Arranged: N/A DME Agency: NA Date DME Agency Contacted: 09/27/23 Time DME Agency Contacted: 1440 Representative spoke with at DME Agency: Mitch with [employer] HH Arranged: NA HH Agency: NA        Social Drivers of Health (SDOH) Interventions SDOH Screenings   Food Insecurity: No Food Insecurity (09/27/2023)  Housing: Low Risk  (09/27/2023)  Transportation Needs: No Transportation Needs (09/27/2023)  Utilities: Not At Risk (09/27/2023)  Depression (PHQ2-9): Low Risk  (07/06/2021)  Social Connections: Socially Integrated (09/27/2023)  Tobacco Use: Low Risk  (09/27/2023)     Readmission Risk  Interventions    10/09/2023   10:24 AM 09/27/2023    2:36 PM 03/06/2023   11:10 AM  Readmission Risk Prevention Plan  Transportation Screening Complete Complete Complete  PCP or Specialist Appt within 5-7 Days  Complete Complete  PCP or Specialist Appt within 3-5 Days Complete    Home Care Screening  Complete Complete  Medication Review (RN CM)  Complete Complete  HRI or Home Care Consult Complete    Social Work Consult for Recovery Care Planning/Counseling Complete    Palliative Care Screening Not Applicable    Medication Review Oceanographer) Complete

## 2023-10-09 NOTE — Discharge Summary (Signed)
 Physician Discharge Summary  Janice Ryan FMW:[id number] DOB: March 01, 1956 DOA: 09/26/2023  PCP: Lorren Greig PARAS, NP  Admit date: 09/26/2023 Discharge date: 10/09/2023 30 Day Unplanned Readmission Risk Score    Flowsheet Row ED to Hosp-Admission (Current) from 09/26/2023 in [facility]  30 Day Unplanned Readmission Risk Score (%) 23.21 Filed at 10/09/2023 0801    This score is the patient's risk of an unplanned readmission within 30 days of being discharged (0 -100%). The score is based on dignosis, age, lab data, medications, orders, and past utilization.   Low:  0-14.9   Medium: 15-21.9   High: 22-29.9   Extreme: 30 and above          Admitted From: Home Disposition: SNF  Recommendations for Outpatient Follow-up:  Follow up with PCP in 1-2 weeks Please obtain BMP/CBC in one week Please follow up with your PCP on the following pending results: Unresulted Labs (From admission, onward)    None         Home Health: None Equipment/Devices:  oxygen-patient is on oxygen chronically  Discharge Condition: Stable CODE STATUS: Full code Diet recommendation: Low-sodium diet  Subjective: Seen and examined, feeling well, no complaints at home.  She is very excited about going to rehab.  Brief/Interim Summary: 68 y.o. female with medical history significant for chronic hypoxic hypercapnic respiratory failure-wears 5 L at home, obstructive sleep apnea, moderate persistent asthma, essential hypertension, generalized anxiety disorder, allergic rhinitis, who was admitted to [facility] on 09/26/2023 with acute on chronic hypoxic hypercapnic respiratory failure after presenting from home to [facility] ED complaining of right hip pain.  This happened after tripping and falling at home on 6/24.  Following the fall she had right hip discomfort radiating into the right groin every time she attempted to bear weight.  Hospital stay complicated by worsening  kidney function due to volume overload.  Details of the hospitalization as below.   Acute on chronic hypoxic and hypercarbic respiratory failure due to combination of obesity hypoventilation syndrome and acute on chronic congestive heart failure with preserved ejection fraction-initial chest x-ray completely normal however due to AKI, she was given fluids which caused volume overload/acute on chronic congestive heart failure with preserved ejection fraction as echo showed normal EF.  She was then started on diuresis.  Has had significant diuresis, per chart she is down at least 16.5 L but ins and outs are not accurate, her edema has improved significantly.  We continue to wean down her Lasix .  Due to risk of reaccumulation of fluid/volume overload, I am now discharging her on low-dose of Lasix  40 mg p.o. daily along with KCl replacement daily.   Acute kidney injury -patient's creatinine on presentation was 1.2-1.4.  She has some intermittent low blood pressure reads but she is normotensive now for most part.  Patient's creatinine started to climb and peaked at 4.2 on 10/01/2023.  Nephrology consulted, started on diuresis, creatinine improved to 1.6 almost 4 days ago and have remained stable.  This is going to be her new baseline.    Hypotension: Her blood pressure remained either low normal or normal without any antihypertensives so we are discontinuing lisinopril  at discharge.   Hyperkalemia-resolved after Lokelma .  Was secondary to AKI.   Hypokalemia: Low again, she will get replenishment before discharge.   Mild hyponatremia: Resolved.   Acute toxic/metabolic encephalopathy-became somnolent after multiple doses of Dilaudid  and Ativan  in the ED.  Now resolved -avoid IV narcotics  Right hip pain-imaging with CT did not show any evidence of fractures.  Pain control, physical therapy   Chronic normocytic anemia: Hemoglobin stable.   Chronic low back pain -slightly worse now after the fall.   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  Continue supportive care   Morbid obesity class III-BMI 63.  She would benefit from weight loss and dietary modification which is counseled.   Depression/anxiety: Resume home medications.   Disposition: PT OT recommended LTAC.  I did peer to peer with the insurance company on 10/03/2023.  LTAC was denied.  LTAC appealing.  However on the morning of 10/05/2023, patient told me that she is not interested in going to LTAC and instead wants to go to SNF.  I informed TOC the same day and requested to start the process.  She has insurance authorization and bed placement arrangement, she is being discharged to SNF today in stable condition.  Discharge Diagnoses:  Principal Problem:   Acute on chronic respiratory failure with hypercapnia (HCC) Active Problems:   Essential hypertension   Moderate persistent asthma   GAD (generalized anxiety disorder)   Morbid obesity (HCC)   Acute right hip pain   Low back pain   Acute encephalopathy   Allergic rhinitis    Discharge Instructions   Allergies as of 10/09/2023       Reactions   Sulfa Antibiotics Nausea And Vomiting, Other (See Comments)   Stomach pain        Medication List     STOP taking these medications    amoxicillin -clavulanate 875-125 MG tablet Commonly known as: AUGMENTIN    lisinopril  20 MG tablet Commonly known as: ZESTRIL    meloxicam  15 MG tablet Commonly known as: MOBIC        TAKE these medications    Acetaminophen  Extra Strength 500 MG Tabs Take 2 tablets (1,000 mg total) by mouth every 6 (six) hours as needed for mild pain (pain score 1-3).   amphetamine-dextroamphetamine 20 MG 24 hr capsule Commonly known as: ADDERALL XR Take 20 mg by mouth daily.   busPIRone  15 MG tablet Commonly known as: BUSPAR  Take 15 mg by mouth 3 (three) times daily.   cetirizine  10 MG  tablet Commonly known as: ZyrTEC  Allergy Take 1 tablet (10 mg total) by mouth daily.   cholecalciferol  25 MCG (1000 UNIT) tablet Commonly known as: VITAMIN D3 Take 1,000 Units by mouth daily.   doxepin  50 MG capsule Commonly known as: SINEQUAN  Take 1 capsule (50 mg total) by mouth in the morning.   DULoxetine  60 MG capsule Commonly known as: CYMBALTA  Take 2 capsules (120 mg total) by mouth daily.   fluticasone  50 MCG/ACT nasal spray Commonly known as: FLONASE  Place 2 sprays into both nostrils daily.   fluticasone -salmeterol 500-50 MCG/ACT Aepb Commonly known as: ADVAIR Inhale 1 puff into the lungs in the morning and at bedtime.   furosemide  40 MG tablet Commonly known as: Lasix  Take 1 tablet (40 mg total) by mouth daily.   gabapentin  300 MG capsule Commonly known as: NEURONTIN  Take 1 capsule (300 mg total) by mouth at bedtime.   mirtazapine  15 MG tablet Commonly known as: REMERON  Take 1 tablet (15 mg total) by mouth at bedtime.   montelukast  10 MG tablet Commonly known as: SINGULAIR  Take 1 tablet (10 mg total) by mouth at bedtime.   multivitamin with minerals Tabs tablet Take 1 tablet by mouth daily.  OMEGA-3 PO Take 1 capsule by mouth daily.   oxyCODONE  5 MG immediate release tablet Commonly known as: Oxy IR/ROXICODONE  Take 1 tablet (5 mg total) by mouth every 6 (six) hours as needed for severe pain (pain score 7-10).   oxyCODONE -acetaminophen  5-325 MG tablet Commonly known as: Percocet Take 1 every 6 hours for pain not relieved by Tylenol  or Motrin  along   OXYGEN Inhale 5 L into the lungs continuous.   pantoprazole  40 MG tablet Commonly known as: PROTONIX  Take 1 tablet (40 mg total) by mouth daily.   potassium chloride  10 MEQ tablet Commonly known as: KLOR-CON  M Take 1 tablet (10 mEq total) by mouth daily.   traZODone  50 MG tablet Commonly known as: DESYREL  Take 50 mg by mouth at bedtime as needed for sleep.   Ventolin  HFA 108 (90 Base) MCG/ACT  inhaler Generic drug: albuterol  Inhale 2 puffs into the lungs every 6 (six) hours as needed for wheezing or shortness of breath.        Contact information for follow-up providers     Lorren Greig PARAS, NP Follow up in 1 week(s).   Specialty: Nurse Practitioner Contact information: [address] [place] [address] [phone]              Contact information for after-discharge care     Destination     [facility] [place] .   Service: Skilled Nursing Contact information: [address] [place]  [address] [phone]                    Allergies  Allergen Reactions   Sulfa Antibiotics Nausea And Vomiting and Other (See Comments)    Stomach pain    Consultations: Nephrology   Procedures/Studies: DG CHEST PORT 1 VIEW Result Date: 09/30/2023 CLINICAL DATA:  [id number] Dyspnea [id number] EXAM: PORTABLE CHEST - 1 VIEW COMPARISON:  September 26, 2023 FINDINGS: Bilateral perihilar interstitial opacities throughout both lungs. Moderate cardiomegaly. Similar prominence of the hilar regions, more so on the right than the left, likely due to pulmonary artery dilation. Tortuous aorta with aortic atherosclerosis. No acute fracture or destructive lesions. Multilevel thoracic osteophytosis. IMPRESSION: Moderate cardiomegaly. Increased bilateral perihilar interstitial opacities, worrisome for interstitial edema. Electronically Signed   By: Rogelia Myers M.D.   On: 09/30/2023 12:31   US  RENAL Result Date: 09/29/2023 CLINICAL DATA:  68 year old female with acute renal insufficiency. EXAM: RENAL / URINARY TRACT ULTRASOUND COMPLETE COMPARISON:  CTA chest 02/25/2023. FINDINGS: Right Kidney: Renal measurements: 9.2 x 5.5 x 6.2 cm = volume: 165 mL. Echogenicity within normal limits. No mass or hydronephrosis visualized. Left Kidney: Renal measurements: 9.5 x 6.2 x 4.5 cm = volume: 138 mL. Echogenicity within normal limits. No mass or  hydronephrosis visualized. Bladder: Not visible, technologist reports patient recently voided. Other: No free fluid identified. IMPRESSION: Negative ultrasound appearance of both kidneys. Electronically Signed   By: VEAR Hurst M.D.   On: 09/29/2023 08:58   DG Chest Port 1 View Result Date: 09/26/2023 CLINICAL DATA:  Hypoxia EXAM: PORTABLE CHEST 1 VIEW COMPARISON:  09/12/2023, CT from 02/25/2023 FINDINGS: Cardiac shadow is enlarged but stable. Fullness in the hilar region on the right is noted which may be entirely related to vasculature although more prominent than that seen on the prior exam. No significant edema is noted. No focal confluent infiltrate is seen. No bony abnormality is noted. IMPRESSION: Fullness in the hilar regions which is likely vascular in nature when compared with prior CT  from 02/25/2023 No acute abnormality noted. Electronically Signed   By: Oneil Devonshire M.D.   On: 09/26/2023 21:00   CT Hip Right Wo Contrast Result Date: 09/26/2023 CLINICAL DATA:  Right hip pain after fall. EXAM: CT OF THE RIGHT HIP WITHOUT CONTRAST TECHNIQUE: Multidetector CT imaging of the right hip was performed according to the standard protocol. Multiplanar CT image reconstructions were also generated. RADIATION DOSE REDUCTION: This exam was performed according to the departmental dose-optimization program which includes automated exposure control, adjustment of the mA and/or kV according to patient size and/or use of iterative reconstruction technique. COMPARISON:  CT pelvis dated 09/10/2023. FINDINGS: Bones/Joint/Cartilage No acute fracture or dislocation. The right femoral head is seated within the acetabulum. The right sacroiliac joint and pubic symphysis are intact with mild degenerative changes. Mild osteoarthritis of the right hip. Degenerative changes of the visualized lower lumbar spine. Ligaments Ligaments are suboptimally evaluated by CT. Muscles and Tendons Muscles are within normal limits. No  intramuscular fluid collection or hematoma. Soft tissue No fluid collection or hematoma.  Uterine fibroids again noted. IMPRESSION: 1. No acute osseous abnormality. 2. Mild osteoarthritis of the right hip. Electronically Signed   By: Harrietta Sherry M.D.   On: 09/26/2023 17:04   CT Head Wo Contrast Result Date: 09/26/2023 CLINICAL DATA:  Head trauma, intracranial arterial injury suspected EXAM: CT HEAD WITHOUT CONTRAST TECHNIQUE: Contiguous axial images were obtained from the base of the skull through the vertex without intravenous contrast. RADIATION DOSE REDUCTION: This exam was performed according to the departmental dose-optimization program which includes automated exposure control, adjustment of the mA and/or kV according to patient size and/or use of iterative reconstruction technique. COMPARISON:  CT head September 10, 2023 FINDINGS: Brain: No evidence of acute infarction, hemorrhage, hydrocephalus, extra-axial collection or mass lesion/mass effect. Patchy white matter hypodensities, compatible with chronic microvascular disease. Similar scattered small intraparenchymal calcifications. Vascular: No hyperdense vessel identified. Calcific atherosclerosis. Skull: No acute fracture. Sinuses/Orbits: Clear sinuses.  No acute orbital findings. Other: No mastoid effusions. IMPRESSION: No evidence of acute intracranial abnormality. Electronically Signed   By: Gilmore GORMAN Molt M.D.   On: 09/26/2023 16:48   MR LUMBAR SPINE WO CONTRAST Result Date: 09/26/2023 CLINICAL DATA:  Myelopathy, acute, lumbar spine. Fall yesterday. Pain and numbness in the low back and legs. Right hip pain. EXAM: MRI LUMBAR SPINE WITHOUT CONTRAST TECHNIQUE: Multiplanar, multisequence MR imaging of the lumbar spine was performed. No intravenous contrast was administered. COMPARISON:  CT lumbar spine 09/10/2023 FINDINGS: The study is motion degraded, including severe motion on the axial T2 sequence. Segmentation: 4 lumbar vertebrae with  completely sacralized L5. Alignment:  Exaggerated lumbar lordosis.  No listhesis. Vertebrae: No fracture or suspicious marrow lesion. Mild left facet edema at L3-4, degenerative in appearance. Large hemangioma involving nearly the entirety of the L3 vertebral body. Small hemangioma at T11. Conus medullaris and cauda equina: Conus extends to the T12-L1 level. Conus and cauda equina appear normal. Paraspinal and other soft tissues: Unremarkable. Disc levels: T12-L1 and L1-2: Negative. L2-3: Disc desiccation and moderate disc space narrowing. Disc bulging and severe facet and ligamentum flavum hypertrophy result in mild-to-moderate spinal stenosis and mild bilateral neural foraminal stenosis. L3-4: Disc desiccation and mild disc space narrowing. Left eccentric disc bulging and severe left greater than right facet and ligamentum flavum hypertrophy result in mild-to-moderate spinal stenosis and mild-to-moderate left neural foraminal stenosis. L4-5: Disc desiccation and moderate disc space narrowing. Disc bulging and severe facet and ligamentum flavum hypertrophy result in mild-to-moderate spinal  stenosis and severe right and moderate to severe left neural foraminal stenosis. L5-S1: Transitional anatomy with rudimentary disc.  No stenosis. IMPRESSION: 1. Transitional lumbosacral anatomy. 2. No acute osseous abnormality. 3. Severe lower lumbar facet arthrosis. 4. 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 5. Severe neural foraminal stenosis at L4-5. Electronically Signed   By: Dasie Hamburg M.D.   On: 09/26/2023 14:07   MR Cervical Spine Wo Contrast Result Date: 09/26/2023 CLINICAL DATA:  Ataxia, cervical trauma. Fall yesterday. Pain and numbness in the low back and legs. EXAM: MRI CERVICAL SPINE WITHOUT CONTRAST TECHNIQUE: Multiplanar, multisequence MR imaging of the cervical spine was performed. No intravenous contrast was administered. COMPARISON:  CT cervical spine 02/07/2023 FINDINGS: The study is  motion degraded, including severe motion on 2 attempted sagittal STIR sequences and on both axial sequences with the patient unable to tolerate any additional attempts at repeat imaging. Alignment: Cervical spine straightening.  No listhesis. Vertebrae: No fracture, suspicious marrow lesion, or significant marrow edema. Hemangiomas in the T1 and T3 vertebral bodies. Chronic degenerative endplate changes from C3-C7. Severe disc space narrowing at C4-5 and C5-6. Cord: Normal cord signal and morphology on the standard sagittal T2 sequence which is of good quality. Posterior Fossa, vertebral arteries, paraspinal tissues: Unremarkable included posterior fossa. Preserved vertebral artery flow voids. No prevertebral fluid or significant paraspinal soft tissue edema. Disc levels: Detailed characterization of degenerative changes is limited due to the degree of motion artifact on axial imaging. Mild spinal stenosis at C4-5 and C5-6 due to broad-based posterior disc osteophyte complexes without cord compression. Suspected moderate multilevel neural foraminal stenosis which is poorly evaluated. IMPRESSION: 1. Severely motion degraded examination. No definite evidence of acute cervical spine injury. Normal appearance of the cervical spinal cord. 2. 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 Electronically Signed   By: Dasie Hamburg M.D.   On: 09/26/2023 13:56   ECHOCARDIOGRAM COMPLETE Result Date: 09/13/2023    ECHOCARDIOGRAM REPORT   Patient Name:   Jaretzi JEFFRIES-BOYKIN Date of Exam: 09/13/2023 Medical Rec #:  [id number]             Height:       68.0 in Accession #:    [id number]            Weight:       405.0 lb Date of Birth:  1955-12-29              BSA:          2.758 m Patient Age:    68 years              BP:           140/60 mmHg Patient Gender: F                     HR:           102 bpm. Exam Location:  Inpatient Procedure: 2D Echo, Cardiac Doppler, Color Doppler and Intracardiac             Opacification Agent (Both Spectral and Color Flow Doppler were            utilized during procedure). Indications:    Abnormal ECG  History:        Patient has prior history of Echocardiogram examinations, most                 recent 01/22/2022. Risk Factors:Hypertension and Sleep Apnea.  Sonographer:  Philomena Daring Referring Phys: [id number] ELIZABETH G MATHEWS  Sonographer Comments: Technically difficult study due to poor echo windows and patient is obese. Image acquisition challenging due to patient body habitus. IMPRESSIONS  1. Left ventricular ejection fraction, by estimation, is 65 to 70%. The left ventricle has normal function. The left ventricle has no regional wall motion abnormalities. Left ventricular diastolic parameters are indeterminate.  2. Right ventricular systolic function is normal. The right ventricular size is normal. There is normal pulmonary artery systolic pressure.  3. Left atrial size was moderately dilated.  4. The mitral valve is grossly normal. Trivial mitral valve regurgitation. No evidence of mitral stenosis.  5. The aortic valve is grossly normal. There is mild calcification of the aortic valve. Aortic valve regurgitation is not visualized. Mild aortic valve stenosis.  6. The inferior vena cava is dilated in size with >50% respiratory variability, suggesting right atrial pressure of 8 mmHg. Comparison(s): No significant change from prior study. FINDINGS  Left Ventricle: Left ventricular ejection fraction, by estimation, is 65 to 70%. The left ventricle has normal function. The left ventricle has no regional wall motion abnormalities. Definity  contrast agent was given IV to delineate the left ventricular  endocardial borders. The left ventricular internal cavity size was normal in size. Suboptimal image quality limits for assessment of left ventricular hypertrophy. Left ventricular diastolic parameters are indeterminate. Right Ventricle: The right ventricular size is normal. Right  vetricular wall thickness was not well visualized. Right ventricular systolic function is normal. There is normal pulmonary artery systolic pressure. The tricuspid regurgitant velocity is 2.25 m/s, and with an assumed right atrial pressure of 8 mmHg, the estimated right ventricular systolic pressure is 28.2 mmHg. Left Atrium: Left atrial size was moderately dilated. Right Atrium: Right atrial size was normal in size. Pericardium: The pericardium was not well visualized. Mitral Valve: The mitral valve is grossly normal. Trivial mitral valve regurgitation. No evidence of mitral valve stenosis. Tricuspid Valve: The tricuspid valve is not well visualized. Tricuspid valve regurgitation is trivial. No evidence of tricuspid stenosis. Aortic Valve: The aortic valve is grossly normal. There is mild calcification of the aortic valve. Aortic valve regurgitation is not visualized. Mild aortic stenosis is present. Aortic valve mean gradient measures 10.7 mmHg. Aortic valve peak gradient measures 20.4 mmHg. Aortic valve area, by VTI measures 1.63 cm. Pulmonic Valve: The pulmonic valve was not well visualized. Pulmonic valve regurgitation is not visualized. No evidence of pulmonic stenosis. Aorta: The aortic root and ascending aorta are structurally normal, with no evidence of dilitation. Venous: The inferior vena cava is dilated in size with greater than 50% respiratory variability, suggesting right atrial pressure of 8 mmHg. IAS/Shunts: The atrial septum is grossly normal.  LEFT VENTRICLE PLAX 2D LVIDd:         5.50 cm   Diastology LVIDs:         3.80 cm   LV e' medial:    5.98 cm/s LV PW:         1.00 cm   LV E/e' medial:  10.7 LV IVS:        1.10 cm   LV e' lateral:   6.74 cm/s LVOT diam:     1.80 cm   LV E/e' lateral: 9.5 LV SV:         66 LV SV Index:   24 LVOT Area:     2.54 cm  RIGHT VENTRICLE             IVC RV S  prime:     11.40 cm/s  IVC diam: 2.10 cm TAPSE (M-mode): 1.8 cm LEFT ATRIUM             Index        RIGHT  ATRIUM           Index LA diam:        4.60 cm 1.67 cm/m   RA Area:     13.80 cm LA Vol (A2C):   96.0 ml 34.80 ml/m  RA Volume:   29.60 ml  10.73 ml/m LA Vol (A4C):   76.8 ml 27.84 ml/m LA Biplane Vol: 86.1 ml 31.21 ml/m  AORTIC VALVE AV Area (Vmax):    1.50 cm AV Area (Vmean):   1.49 cm AV Area (VTI):     1.63 cm AV Vmax:           225.67 cm/s AV Vmean:          150.667 cm/s AV VTI:            0.406 m AV Peak Grad:      20.4 mmHg AV Mean Grad:      10.7 mmHg LVOT Vmax:         133.00 cm/s LVOT Vmean:        88.300 cm/s LVOT VTI:          0.260 m LVOT/AV VTI ratio: 0.64  AORTA Ao Root diam: 3.10 cm Ao Asc diam:  3.60 cm MITRAL VALVE               TRICUSPID VALVE MV Area (PHT): 2.99 cm    TR Peak grad:   20.2 mmHg MV Decel Time: 254 msec    TR Vmax:        225.00 cm/s MV E velocity: 63.80 cm/s MV A velocity: 93.80 cm/s  SHUNTS MV E/A ratio:  0.68        Systemic VTI:  0.26 m                            Systemic Diam: 1.80 cm Shelda Bruckner MD Electronically signed by Shelda Bruckner MD Signature Date/Time: 09/13/2023/2:43:14 PM    Final    DG Chest 2 View Result Date: 09/12/2023 CLINICAL DATA:  Hypoxia. EXAM: CHEST - 2 VIEW COMPARISON:  September 10, 2023. FINDINGS: Stable cardiomegaly. Possible oval-shaped density is noted posteriorly in a lung on lateral projection only, potentially in right suprahilar region. The visualized skeletal structures are unremarkable. IMPRESSION: Possible right suprahilar density which is primarily visualized on lateral projection only. CT scan is recommended for further evaluation. Electronically Signed   By: Lynwood Landy Raddle M.D.   On: 09/12/2023 18:09   CT Head Wo Contrast Result Date: 09/10/2023 CLINICAL DATA:  Head trauma, minor (Age >= 65y) headache EXAM: CT HEAD WITHOUT CONTRAST TECHNIQUE: Contiguous axial images were obtained from the base of the skull through the vertex without intravenous contrast. RADIATION DOSE REDUCTION: This exam was performed according  to the departmental dose-optimization program which includes automated exposure control, adjustment of the mA and/or kV according to patient size and/or use of iterative reconstruction technique. COMPARISON:  CT head 02/07/2023 FINDINGS: Brain: Patchy and confluent areas of decreased attenuation are noted throughout the deep and periventricular white matter of the cerebral hemispheres bilaterally, compatible with chronic microvascular ischemic disease. No evidence of large-territorial acute infarction. No parenchymal hemorrhage. No mass lesion. No extra-axial collection. No mass effect or midline shift. No  hydrocephalus. Basilar cisterns are patent. Vascular: No hyperdense vessel. Skull: No acute fracture or focal lesion. Sinuses/Orbits: Paranasal sinuses and mastoid air cells are clear. The orbits are unremarkable. Other: None. IMPRESSION: No acute intracranial abnormality. Electronically Signed   By: Morgane  Naveau M.D.   On: 09/10/2023 23:01   CT Lumbar Spine Wo Contrast Result Date: 09/10/2023 CLINICAL DATA:  Low back pain, cauda equina syndrome suspected fall, back pain, left hip pain EXAM: CT LUMBAR SPINE WITHOUT CONTRAST TECHNIQUE: Multidetector CT imaging of the lumbar spine was performed without intravenous contrast administration. Multiplanar CT image reconstructions were also generated. RADIATION DOSE REDUCTION: This exam was performed according to the departmental dose-optimization program which includes automated exposure control, adjustment of the mA and/or kV according to patient size and/or use of iterative reconstruction technique. COMPARISON:  CT lumbar spine 02/07/2023 FINDINGS: Segmentation: 4 lumbar type vertebrae. 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 Alignment: Normal. Vertebrae: Multilevel mild degenerative changes of the spine. No acute fracture or focal pathologic process. Paraspinal and other soft tissues: Lower back midline subcutaneus soft tissue edema. Disc levels: Multilevel  intervertebral disc space vacuum phenomenon. Other: Atherosclerotic plaque. Fluid density lesion of the right kidney likely represents a simple renal cyst. IMPRESSION: 1. No acute displaced fracture or traumatic listhesis of the lumbar spine. 2.  Aortic Atherosclerosis (ICD10-I70.0). Electronically Signed   By: Morgane  Naveau M.D.   On: 09/10/2023 22:51   CT PELVIS WO CONTRAST Result Date: 09/10/2023 CLINICAL DATA:  Hip trauma, fracture suspected, no prior imaging EXAM: CT PELVIS WITHOUT CONTRAST TECHNIQUE: Multidetector CT imaging of the pelvis was performed following the standard protocol without intravenous contrast. RADIATION DOSE REDUCTION: This exam was performed according to the departmental dose-optimization program which includes automated exposure control, adjustment of the mA and/or kV according to patient size and/or use of iterative reconstruction technique. COMPARISON:  CT pelvis 02/07/2023 FINDINGS: Urinary Tract:  No abnormality visualized. Bowel:  Unremarkable visualized pelvic bowel loops. Vascular/Lymphatic: Atherosclerotic plaque. No pathologically enlarged lymph nodes. No significant vascular abnormality seen. Reproductive: Lobulated uterine contour with calcifications suggestive of uterine fibroids. Otherwise no mass or other significant abnormality Other: No intraperitoneal free fluid. No intraperitoneal free gas. No organized fluid collection. Musculoskeletal: No acute displaced fracture or dislocation of either hips. No acute displaced fracture or diastasis of the bones of the pelvis. IMPRESSION: 1. Negative for acute traumatic injury. 2. Uterine fibroids. 3.  Aortic Atherosclerosis (ICD10-I70.0). Electronically Signed   By: Morgane  Naveau M.D.   On: 09/10/2023 22:46   DG Chest Portable 1 View Result Date: 09/10/2023 CLINICAL DATA:  Cough. EXAM: PORTABLE CHEST 1 VIEW COMPARISON:  02/27/2023 FINDINGS: Lordotic exam with rotation. Question of right upper and lower lung zone opacity  versus overlapping soft tissue structures. Cardiomegaly is stable. No pneumothorax or large pleural effusion. Skin fold projects over the right hemithorax. IMPRESSION: 1. Question of right upper and lower lung zone opacity versus overlapping soft tissue structures. Recommend PA and lateral radiograph for further assessment when patient is able. 2. Stable cardiomegaly. Electronically Signed   By: Andrea Gasman M.D.   On: 09/10/2023 17:54     Discharge Exam: Vitals:   10/09/23 0448 10/09/23 0742  BP: 125/68   Pulse: 82   Resp: 18   Temp: 98 F (36.7 C)   SpO2: 91% 94%   Vitals:   10/08/23 1911 10/09/23 0015 10/09/23 0448 10/09/23 0742  BP: (!) 112/48  125/68   Pulse: 88  82   Resp: 20 14 18  Temp: 98.7 F (37.1 C)  98 F (36.7 C)   TempSrc:      SpO2: 98%  91% 94%  Weight:      Height:        General: Pt is alert, awake, not in acute distress, morbidly obese Cardiovascular: RRR, S1/S2 +, no rubs, no gallops Respiratory: CTA bilaterally, no wheezing, no rhonchi Abdominal: Soft, NT, ND, bowel sounds + Extremities: no edema, no cyanosis    The results of significant diagnostics from this hospitalization (including imaging, microbiology, ancillary and laboratory) are listed below for reference.     Microbiology: No results found for this or any previous visit (from the past 240 hours).   Labs: BNP (last 3 results) Recent Labs    03/01/23 0328 09/10/23 1901 09/26/23 1948  BNP 43.2 93.1 101.1*   Basic Metabolic Panel: Recent Labs  Lab 10/05/23 0303 10/06/23 0934 10/07/23 0557 10/08/23 0918 10/09/23 0526  NA 136 137 138 138 138  K 3.9 3.4* 3.6 3.6 3.3*  CL 90* 89* 89* 88* 88*  CO2 32 32 35* 37* 37*  GLUCOSE 114* 128* 118* 136* 111*  BUN 87* 73* 68* 58* 60*  CREATININE 2.05* 1.64* 1.50* 1.59* 1.60*  CALCIUM 9.7 10.4* 10.0 10.0 10.1  MG  --   --   --  2.2  --    Liver Function Tests: No results for input(s): AST, ALT, ALKPHOS, BILITOT, PROT,  ALBUMIN in the last 168 hours. No results for input(s): LIPASE, AMYLASE in the last 168 hours. No results for input(s): AMMONIA in the last 168 hours. CBC: Recent Labs  Lab 10/03/23 0258 10/04/23 0250 10/05/23 0303 10/08/23 0918  WBC 8.5 7.8 8.2 8.3  NEUTROABS  --  4.8 5.5 4.9  HGB 9.0* 9.0* 8.7* 9.5*  HCT 28.9* 29.3* 27.9* 30.9*  MCV 92.6 92.4 92.1 93.1  PLT 226 227 239 296   Cardiac Enzymes: No results for input(s): CKTOTAL, CKMB, CKMBINDEX, TROPONINI in the last 168 hours. BNP: Invalid input(s): POCBNP CBG: Recent Labs  Lab 10/06/23 0731  GLUCAP 103*   D-Dimer No results for input(s): DDIMER in the last 72 hours. Hgb A1c No results for input(s): HGBA1C in the last 72 hours. Lipid Profile No results for input(s): CHOL, HDL, LDLCALC, TRIG, CHOLHDL, LDLDIRECT in the last 72 hours. Thyroid function studies No results for input(s): TSH, T4TOTAL, T3FREE, THYROIDAB in the last 72 hours.  Invalid input(s): FREET3 Anemia work up No results for input(s): VITAMINB12, FOLATE, FERRITIN, TIBC, IRON, RETICCTPCT in the last 72 hours. Urinalysis    Component Value Date/Time   COLORURINE YELLOW 09/30/2023 1519   APPEARANCEUR CLEAR 09/30/2023 1519   LABSPEC 1.012 09/30/2023 1519   PHURINE 5.0 09/30/2023 1519   GLUCOSEU NEGATIVE 09/30/2023 1519   HGBUR SMALL (A) 09/30/2023 1519   BILIRUBINUR NEGATIVE 09/30/2023 1519   KETONESUR NEGATIVE 09/30/2023 1519   PROTEINUR NEGATIVE 09/30/2023 1519   NITRITE NEGATIVE 09/30/2023 1519   LEUKOCYTESUR TRACE (A) 09/30/2023 1519   Sepsis Labs Recent Labs  Lab 10/03/23 0258 10/04/23 0250 10/05/23 0303 10/08/23 0918  WBC 8.5 7.8 8.2 8.3   Microbiology No results found for this or any previous visit (from the past 240 hours).  FURTHER DISCHARGE INSTRUCTIONS:   Get Medicines reviewed and adjusted: Please take all your medications with you for your next visit with your Primary  MD   Laboratory/radiological data: Please request your Primary MD to go over all hospital tests and procedure/radiological results at the follow up, please ask your  Primary MD to get all Hospital records sent to his/her office.   In some cases, they will be blood work, cultures and biopsy results pending at the time of your discharge. Please request that your primary care M.D. goes through all the records of your hospital data and follows up on these results.   Also Note the following: If you experience worsening of your admission symptoms, develop shortness of breath, life threatening emergency, suicidal or homicidal thoughts you must seek medical attention immediately by calling 911 or calling your MD immediately  if symptoms less severe.   You must read complete instructions/literature along with all the possible adverse reactions/side effects for all the Medicines you take and that have been prescribed to you. Take any new Medicines after you have completely understood and accpet all the possible adverse reactions/side effects.    patient was instructed, not to drive, operate heavy machinery, perform activities at heights, swimming or participation in water activities or provide baby-sitting services while on Pain, Sleep and Anxiety Medications; until their outpatient Physician has advised to do so again. Also recommended to not to take more than prescribed Pain, Sleep and Anxiety Medications.  It is not advisable to combine anxiety, sleep and pain medications without talking with your primary care provider.     Wear Seat belts while driving.   Please note: You were cared for by a hospitalist during your hospital stay. Once you are discharged, your primary care physician will handle any further medical issues. Please note that NO REFILLS for any discharge medications will be authorized once you are discharged, as it is imperative that you return to your primary care physician (or establish a  relationship with a primary care physician if you do not have one) for your post hospital discharge needs so that they can reassess your need for medications and monitor your lab values  Time coordinating discharge: Over 30 minutes  SIGNED:   Fredia Skeeter, MD  [facility] 10/09/2023, 9:09 AM *Please note that this is a verbal dictation therefore any spelling or grammatical errors are due to the Dragon Medical One system interpretation. If 7PM-7AM, please contact night-coverage [email or web address]

## 2023-10-09 NOTE — Plan of Care (Signed)
  Problem: Education: Goal: Knowledge of General Education information will improve Description: Including pain rating scale, medication(s)/side effects and non-pharmacologic comfort measures Outcome: Progressing   Problem: Health Behavior/Discharge Planning: Goal: Ability to manage health-related needs will improve Outcome: Progressing   Problem: Clinical Measurements: Goal: Ability to maintain clinical measurements within normal limits will improve Outcome: Progressing Goal: Will remain free from infection Outcome: Progressing Goal: Diagnostic test results will improve Outcome: Progressing Goal: Respiratory complications will improve Outcome: Progressing Goal: Cardiovascular complication will be avoided Outcome: Progressing   Problem: Elimination: Goal: Will not experience complications related to bowel motility Outcome: Progressing Goal: Will not experience complications related to urinary retention Outcome: Progressing   Problem: Pain Managment: Goal: General experience of comfort will improve and/or be controlled Outcome: Progressing   Problem: Safety: Goal: Ability to remain free from injury will improve Outcome: Progressing   Problem: Skin Integrity: Goal: Risk for impaired skin integrity will decrease Outcome: Progressing   Problem: Activity: Goal: Risk for activity intolerance will decrease Outcome: Not Progressing   Problem: Nutrition: Goal: Adequate nutrition will be maintained Outcome: Not Progressing   Problem: Coping: Goal: Level of anxiety will decrease Outcome: Not Progressing

## 2023-10-16 ENCOUNTER — Encounter (HOSPITAL_BASED_OUTPATIENT_CLINIC_OR_DEPARTMENT_OTHER): Payer: Self-pay

## 2023-10-29 ENCOUNTER — Other Ambulatory Visit: Payer: Self-pay | Admitting: Family

## 2023-10-29 NOTE — Telephone Encounter (Signed)
 Lisinopril  discontinued on 09/13/2023 by Almarie Hoots, MD.

## 2023-10-30 NOTE — Telephone Encounter (Signed)
 Lisinopril  discontinued on 09/13/2023 by Almarie Hoots, MD.

## 2023-10-31 ENCOUNTER — Encounter (HOSPITAL_BASED_OUTPATIENT_CLINIC_OR_DEPARTMENT_OTHER): Payer: Self-pay

## 2023-10-31 NOTE — Telephone Encounter (Signed)
 Lisinopril  discontinued on 09/13/2023 by Almarie Hoots, MD.

## 2023-11-10 ENCOUNTER — Emergency Department (HOSPITAL_COMMUNITY)
Admission: EM | Admit: 2023-11-10 | Discharge: 2023-11-11 | Disposition: A | Discharge status: Home / Self Care | Attending: Emergency Medicine | Admitting: Emergency Medicine

## 2023-11-10 ENCOUNTER — Encounter (HOSPITAL_COMMUNITY): Payer: Self-pay

## 2023-11-10 ENCOUNTER — Other Ambulatory Visit: Payer: Self-pay

## 2023-11-10 DIAGNOSIS — R3915 Urgency of urination: Secondary | ICD-10-CM | POA: Insufficient documentation

## 2023-11-10 DIAGNOSIS — R300 Dysuria: Secondary | ICD-10-CM | POA: Insufficient documentation

## 2023-11-10 DIAGNOSIS — R32 Unspecified urinary incontinence: Secondary | ICD-10-CM | POA: Diagnosis not present

## 2023-11-10 DIAGNOSIS — M545 Low back pain, unspecified: Secondary | ICD-10-CM | POA: Insufficient documentation

## 2023-11-10 DIAGNOSIS — J111 Influenza due to unidentified influenza virus with other respiratory manifestations: Secondary | ICD-10-CM

## 2023-11-10 DIAGNOSIS — D649 Anemia, unspecified: Secondary | ICD-10-CM | POA: Diagnosis not present

## 2023-11-10 MED ORDER — OXYCODONE-ACETAMINOPHEN 5-325 MG PO TABS
1.0000 | ORAL_TABLET | Freq: Once | ORAL | Status: AC
  Administered 2023-11-10: 1 via ORAL
  Filled 2023-11-10: qty 1

## 2023-11-10 NOTE — ED Triage Notes (Signed)
 Pt. Arrives via [facility] for urinary incontinence, and bilateral flank pain. Pt. Reports fever and chills. Urine is malodorous. Pt. Also c/o right leg pain.

## 2023-11-10 NOTE — ED Provider Notes (Signed)
 [place] [facility] Provider Note   CSN: [id number] Arrival date & time: 11/10/23  2209     Patient presents with: Urinary Incontinence   Janice Ryan is a 68 y.o. female.  {Add pertinent medical, surgical, social history, OB history to YEP:67052} The history is provided by the patient.   She has history of hypertension, asthma, morbid obesity with chronic hypercapnic respiratory failure and comes in with multiple complaints.  She states that over the last 3 days, she has been having frequent chills, pain across her lower back, dysuria, urinary urge incontinence.  She denies fever and sweats.  She has had some intermittent nausea but no vomiting.  She has been constipated, no diarrhea.  She denies any focal weakness or numbness and denies saddle anesthesia.    Prior to Admission medications   Medication Sig Start Date End Date Taking? Authorizing Provider  acetaminophen  (TYLENOL ) 500 MG tablet Take 2 tablets (1,000 mg total) by mouth every 6 (six) hours as needed for mild pain (pain score 1-3). 09/13/23   Will Almarie MATSU, MD  albuterol  (VENTOLIN  HFA) 108 [phone] Base) MCG/ACT inhaler Inhale 2 puffs into the lungs every 6 (six) hours as needed for wheezing or shortness of breath. 09/13/23   Will Almarie MATSU, MD  amphetamine-dextroamphetamine (ADDERALL XR) 20 MG 24 hr capsule Take 20 mg by mouth daily.    [provider]  busPIRone  (BUSPAR ) 15 MG tablet Take 15 mg by mouth 3 (three) times daily.    [provider]  cetirizine  (ZYRTEC  ALLERGY) 10 MG tablet Take 1 tablet (10 mg total) by mouth daily. 01/03/23   Lorren Greig PARAS, NP  cholecalciferol  (VITAMIN D3) 25 MCG (1000 UNIT) tablet Take 1,000 Units by mouth daily.    [provider]  doxepin  (SINEQUAN ) 50 MG capsule Take 1 capsule (50 mg total) by mouth in the morning. 07/26/23   Odell Balls, PA-C  DULoxetine  (CYMBALTA ) 60 MG capsule Take 2 capsules (120 mg  total) by mouth daily. 02/15/23   Regalado, Belkys A, MD  fluticasone  (FLONASE ) 50 MCG/ACT nasal spray Place 2 sprays into both nostrils daily. 02/20/22   Donzetta Rollene BRAVO, MD  fluticasone -salmeterol (ADVAIR) 500-50 MCG/ACT AEPB Inhale 1 puff into the lungs in the morning and at bedtime. 08/01/22   Mayers, Cari S, PA-C  furosemide  (LASIX ) 40 MG tablet Take 1 tablet (40 mg total) by mouth daily. 10/09/23 10/08/24  Vernon Ranks, MD  gabapentin  (NEURONTIN ) 300 MG capsule Take 1 capsule (300 mg total) by mouth at bedtime. 08/20/23 11/18/23  Lorren Greig PARAS, NP  mirtazapine  (REMERON ) 15 MG tablet Take 1 tablet (15 mg total) by mouth at bedtime. 07/26/23   Odell Balls, PA-C  montelukast  (SINGULAIR ) 10 MG tablet Take 1 tablet (10 mg total) by mouth at bedtime. Patient not taking: Reported on 09/28/2023 01/16/23   Lorren Greig PARAS, NP  Multiple Vitamin (MULTIVITAMIN WITH MINERALS) TABS tablet Take 1 tablet by mouth daily.    [provider]  Omega-3 Fatty Acids (OMEGA-3 PO) Take 1 capsule by mouth daily.    [provider]  oxyCODONE  (OXY IR/ROXICODONE ) 5 MG immediate release tablet Take 1 tablet (5 mg total) by mouth every 6 (six) hours as needed for severe pain (pain score 7-10). 10/09/23   Vernon Ranks, MD  oxyCODONE -acetaminophen  (PERCOCET) 5-325 MG tablet Take 1 every 6 hours for pain not relieved by Tylenol  or Motrin  along 09/26/23   Zammit, Joseph, MD  OXYGEN Inhale 5 L  into the lungs continuous.    [provider]  pantoprazole  (PROTONIX ) 40 MG tablet Take 1 tablet (40 mg total) by mouth daily. 03/07/23   Elgergawy, Brayton RAMAN, MD  potassium chloride  (KLOR-CON  M) 10 MEQ tablet Take 1 tablet (10 mEq total) by mouth daily. 10/09/23 11/08/23  Vernon Ranks, MD  traZODone  (DESYREL ) 50 MG tablet Take 50 mg by mouth at bedtime as needed for sleep.    [provider]    Allergies: Sulfa antibiotics    Review of Systems  All other systems reviewed and are negative.   Updated  Vital Signs BP (!) 147/78   Pulse 74   Temp (!) 97.5 F (36.4 C) (Oral)   Resp 19   SpO2 92%   Physical Exam Vitals and nursing note reviewed.   Morbidly obese 67 year old female, resting comfortably and in no acute distress. Vital signs are significant for elevated systolic blood pressure. Oxygen saturation is 92%, which is normal. Head is normocephalic and atraumatic. PERRLA, EOMI. Neck is nontender. Back is nontender, there is no CVA tenderness. Lungs are clear without rales, wheezes, or rhonchi. Chest is nontender. Heart has regular rate and rhythm without murmur. Abdomen is soft, flat, nontender. Extremities have no cyanosis or edema. Skin is warm and dry without rash. Neurologic: Awake and alert, cranial nerves are intact, strength is 5/5 in all 4 extremities, no objective sensory deficits.  (all labs ordered are listed, but only abnormal results are displayed) Labs Reviewed  URINALYSIS, ROUTINE W REFLEX MICROSCOPIC  CBC WITH DIFFERENTIAL/PLATELET  COMPREHENSIVE METABOLIC PANEL WITH GFR    EKG: None  Radiology: No results found.  {Document cardiac monitor, telemetry assessment procedure when appropriate:32947} Procedures   Medications Ordered in the ED - No data to display    {Click here for ABCD2, HEART and other calculators REFRESH Note before signing:1}                              Medical Decision Making  Chills, back pain, dysuria with urgent continence concerning for UTI.  She is not febrile here, but I have ordered workup for possible UTI and sepsis.  I have ordered a dose of oxycodone -acetaminophen  for pain.  No evidence of neurologic injury.  {Document critical care time when appropriate  Document review of labs and clinical decision tools ie CHADS2VASC2, etc  Document your independent review of radiology images and any outside records  Document your discussion with family members, caretakers and with consultants  Document social determinants of  health affecting pt's care  Document your decision making why or why not admission, treatments were needed:32947:::1}   Final diagnoses:  None    ED Discharge Orders     None

## 2023-11-11 DIAGNOSIS — M545 Low back pain, unspecified: Secondary | ICD-10-CM | POA: Diagnosis not present

## 2023-11-11 LAB — CBC WITH DIFFERENTIAL/PLATELET
Abs Immature Granulocytes: 0.06 K/uL (ref 0.00–0.07)
Basophils Absolute: 0 K/uL (ref 0.0–0.1)
Basophils Relative: 0 %
Eosinophils Absolute: 0.3 K/uL (ref 0.0–0.5)
Eosinophils Relative: 3 %
HCT: 33.2 % — ABNORMAL LOW (ref 36.0–46.0)
Hemoglobin: 9.3 g/dL — ABNORMAL LOW (ref 12.0–15.0)
Immature Granulocytes: 1 %
Lymphocytes Relative: 20 %
Lymphs Abs: 1.9 K/uL (ref 0.7–4.0)
MCH: 27.9 pg (ref 26.0–34.0)
MCHC: 28 g/dL — ABNORMAL LOW (ref 30.0–36.0)
MCV: 99.7 fL (ref 80.0–100.0)
Monocytes Absolute: 0.7 K/uL (ref 0.1–1.0)
Monocytes Relative: 8 %
Neutro Abs: 6.4 K/uL (ref 1.7–7.7)
Neutrophils Relative %: 68 %
Platelets: 239 K/uL (ref 150–400)
RBC: 3.33 MIL/uL — ABNORMAL LOW (ref 3.87–5.11)
RDW: 12.3 % (ref 11.5–15.5)
WBC: 9.3 K/uL (ref 4.0–10.5)
nRBC: 0 % (ref 0.0–0.2)

## 2023-11-11 LAB — URINALYSIS, W/ REFLEX TO CULTURE (INFECTION SUSPECTED)
Bacteria, UA: NONE SEEN
Bilirubin Urine: NEGATIVE
Glucose, UA: NEGATIVE mg/dL
Hgb urine dipstick: NEGATIVE
Ketones, ur: NEGATIVE mg/dL
Nitrite: NEGATIVE
Protein, ur: NEGATIVE mg/dL
Specific Gravity, Urine: 1.015 (ref 1.005–1.030)
pH: 5 (ref 5.0–8.0)

## 2023-11-11 LAB — COMPREHENSIVE METABOLIC PANEL WITH GFR
ALT: 9 U/L (ref 0–44)
AST: 13 U/L — ABNORMAL LOW (ref 15–41)
Albumin: 3.2 g/dL — ABNORMAL LOW (ref 3.5–5.0)
Alkaline Phosphatase: 70 U/L (ref 38–126)
Anion gap: 8 (ref 5–15)
BUN: 15 mg/dL (ref 8–23)
CO2: 38 mmol/L — ABNORMAL HIGH (ref 22–32)
Calcium: 9.7 mg/dL (ref 8.9–10.3)
Chloride: 94 mmol/L — ABNORMAL LOW (ref 98–111)
Creatinine, Ser: 1.02 mg/dL — ABNORMAL HIGH (ref 0.44–1.00)
GFR, Estimated: >60 mL/min (ref >60)
Glucose, Bld: 99 mg/dL (ref 70–99)
Potassium: 4.3 mmol/L (ref 3.5–5.1)
Sodium: 140 mmol/L (ref 135–145)
Total Bilirubin: 0.4 mg/dL (ref 0.0–1.2)
Total Protein: 7.2 g/dL (ref 6.5–8.1)

## 2023-11-11 NOTE — ED Notes (Signed)
 [facility] called and transport arranged. Pt advised there would be someone at home waiting for her.

## 2023-11-11 NOTE — Discharge Instructions (Addendum)
 Drink plenty of fluids.  Take acetaminophen /or ibuprofen  as needed for achiness.  Return to the emergency department if symptoms are getting worse.

## 2023-11-12 ENCOUNTER — Telehealth: Payer: Self-pay

## 2023-11-12 NOTE — Telephone Encounter (Signed)
 Copied from CRM [id number]. Topic: Clinical - Home Health Verbal Orders >> Nov 12, 2023  2:15 PM Fonda T wrote: Caller/Agency: Liji, Physical Therapist, [facility] Callback Number: [phone], secured line, ok to leave voicemail Service Requested: Physical Therapy Frequency: one for 1 week, 2 times for 3 weeks, and once for 3 weeks Any new concerns about the patient? No

## 2023-11-13 NOTE — Telephone Encounter (Signed)
 Please advise if it is ok to do verbal orders  No OV in the last 6 months

## 2023-11-14 ENCOUNTER — Telehealth: Payer: Self-pay

## 2023-11-14 ENCOUNTER — Telehealth: Payer: Self-pay | Admitting: Family

## 2023-11-14 ENCOUNTER — Other Ambulatory Visit: Payer: Self-pay | Admitting: Family

## 2023-11-14 NOTE — Telephone Encounter (Signed)
 error

## 2023-11-14 NOTE — Telephone Encounter (Signed)
 Copied from CRM [id number]. Topic: Clinical - Home Health Verbal Orders >> Nov 12, 2023  2:15 PM Fonda T wrote: Caller/Agency: Liji, Physical Therapist, [facility] Callback Number: [phone], secured line, ok to leave voicemail Service Requested: Physical Therapy Frequency: one for 1 week, 2 times for 3 weeks, and once for 3 weeks Any new concerns about the patient? No >> Nov 14, 2023 10:31 AM Montie POUR wrote: Paralee, Physical Therapist, [facility] Callback Number: [phone], secured line, ok to leave voicemail She is calling back to check on verbal orders. Please give her a call.

## 2023-11-14 NOTE — Telephone Encounter (Signed)
 Yes

## 2023-11-14 NOTE — Telephone Encounter (Signed)
 Copied from CRM [id number]. Topic: Appointments - Appointment Scheduling >> Nov 14, 2023  2:08 PM Montie POUR wrote: Patient/patient representative is calling to schedule an appointment. Refer to attachments for appointment information.  Daksha has been in the hospital and skilled rehab and discharged from rehab on 11/06/23. She needs a follow up appointment and no appointment is available. Please call Aradhya at [phone]. Thanks

## 2023-11-14 NOTE — Telephone Encounter (Unsigned)
 Copied from CRM #[id number]. Topic: Clinical - Medication Refill >> Nov 14, 2023  2:04 PM Montie POUR wrote: Medication:  oxyCODONE  (OXY IR/ROXICODONE ) 5 MG immediate release tablet  traZODone  (DESYREL ) 50 MG tablet  Has the patient contacted their pharmacy? Yes (Agent: If no, request that the patient contact the pharmacy for the refill. If patient does not wish to contact the pharmacy document the reason why and proceed with request.) (Agent: If yes, when and what did the pharmacy advise?) Pharmacy needs order to refill  This is the patient's preferred pharmacy:  [facility] [address] [place] [address] Phone: [phone] Fax: [phone]  Is this the correct pharmacy for this prescription? Yes If no, delete pharmacy and type the correct one.   Has the prescription been filled recently? Yes  Is the patient out of the medication? Yes   Has the patient been seen for an appointment in the last year OR does the patient have an upcoming appointment? Yes  Can we respond through MyChart? Yes  Agent: Please be advised that Rx refills may take up to 3 business days. We ask that you follow-up with your pharmacy.

## 2023-11-15 ENCOUNTER — Telehealth: Payer: Self-pay

## 2023-11-15 NOTE — Telephone Encounter (Signed)
 Noted

## 2023-11-15 NOTE — Telephone Encounter (Signed)
 Called [name] and did verbal orders she wanted to make known there is a drug interaction with Doxrpin. I let her know that I will call patient and schedule  an Hospital f/u. Patient was called and voicemail

## 2023-11-15 NOTE — Telephone Encounter (Signed)
 Copied from CRM [id number]. Topic: Clinical - Home Health Verbal Orders >> Nov 12, 2023  2:15 PM Fonda T wrote: Caller/Agency: Liji, Physical Therapist, [facility] Callback Number: [phone], secured line, ok to leave voicemail Service Requested: Physical Therapy Frequency: one for 1 week, 2 times for 3 weeks, and once for 3 weeks Any new concerns about the patient? No >> Nov 15, 2023  9:55 AM Montie POUR wrote: Please call Liji back to order the above home health. She is calling back today to see is NP Lorren will okay home health. Her information is listed above.  >> Nov 14, 2023 10:31 AM Montie POUR wrote: Paralee, Physical Therapist, [facility] Callback Number: [phone], secured line, ok to leave voicemail She is calling back to check on verbal orders. Please give her a call.

## 2023-11-16 LAB — CULTURE, BLOOD (ROUTINE X 2)
Culture: NO GROWTH
Special Requests: ADEQUATE

## 2023-11-16 NOTE — Telephone Encounter (Signed)
 Requested medication (s) are due for refill today: yes  Requested medication (s) are on the active medication list: trazodone : hx med   Last refill:  Oxy: 10/09/23 #30  Trazodone : 09/10/23   Future visit scheduled: no  Notes to clinic:  hx provider    Requested Prescriptions  Pending Prescriptions Disp Refills   oxyCODONE  (OXY IR/ROXICODONE ) 5 MG immediate release tablet 30 tablet 0    Sig: Take 1 tablet (5 mg total) by mouth every 6 (six) hours as needed for severe pain (pain score 7-10).     Not Delegated - Analgesics:  Opioid Agonists Failed - 11/16/2023  3:35 PM      Failed - This refill cannot be delegated      Failed - Urine Drug Screen completed in last 360 days      Failed - Valid encounter within last 3 months    Recent Outpatient Visits           6 months ago Hospital discharge follow-up   [facility], [place], [place]   8 months ago Medication refill   [place] [facility], [name] J, NP   1 year ago Asthma, unspecified asthma severity, unspecified whether complicated, unspecified whether persistent   [place] [facility] at [facility], [name] J, NP   1 year ago Chronic frontal sinusitis   [place] [facility] at [facility], [name] J, NP   2 years ago Chronic frontal sinusitis   [place] [facility] at [facility], [name] J, NP               traZODone  (DESYREL ) 50 MG tablet      Sig: Take 1 tablet (50 mg total) by mouth at bedtime as needed for sleep.     Psychiatry: Antidepressants - Serotonin Modulator Failed - 11/16/2023  3:35 PM      Failed - Completed PHQ-2 or PHQ-9 in the last 360 days      Passed - Valid encounter within last 6 months    Recent Outpatient Visits           6 months ago Hospital discharge follow-up   [facility] at [facility], [place], [place]   8 months ago Medication refill   [place] [facility] at [facility], [name] J, NP   1 year ago Asthma, unspecified asthma severity, unspecified whether complicated, unspecified whether persistent   [place] [facility] at [facility], [place], NP   1 year ago Chronic frontal sinusitis   [place] [facility] at [facility], [name] J, NP   2 years ago Chronic frontal sinusitis   [place] [facility] at [facility], Greig PARAS, NP

## 2023-11-20 ENCOUNTER — Other Ambulatory Visit (HOSPITAL_COMMUNITY): Payer: Self-pay

## 2023-11-21 NOTE — Telephone Encounter (Signed)
 I returned Liji call and she stated someone already called her back with orders

## 2023-11-21 NOTE — Telephone Encounter (Signed)
 Yes. Please refer to CRM request below: Copied from CRM #[id number]. Topic: Clinical - Home Health Verbal Orders >> Nov 12, 2023  2:15 PM Fonda T wrote: Caller/Agency: Liji, Physical Therapist, [facility] Callback Number: [phone], secured line, ok to leave voicemail Service Requested: Physical Therapy Frequency: one for 1 week, 2 times for 3 weeks, and once for 3 weeks Any new concerns about the patient? No >> Nov 15, 2023  9:55 AM Montie POUR wrote: Please call Liji back to order the above home health. She is calling back today to see is NP Lorren will okay home health. Her information is listed above.  >> Nov 14, 2023 10:31 AM Montie POUR wrote: Paralee, Physical Therapist, [facility] Callback Number: [phone], secured line, ok to leave voicemail She is calling back to check on verbal orders. Please give her a call.

## 2023-11-21 NOTE — Telephone Encounter (Signed)
 Pt scheduled

## 2023-11-22 ENCOUNTER — Telehealth: Payer: Self-pay | Admitting: Family

## 2023-11-22 NOTE — Telephone Encounter (Signed)
 We received [facility] Certification form from [employer]. I placed in [name] [employer] up front

## 2023-11-23 ENCOUNTER — Telehealth: Payer: Self-pay

## 2023-11-23 ENCOUNTER — Other Ambulatory Visit: Payer: Self-pay | Admitting: Family

## 2023-11-23 DIAGNOSIS — J4540 Moderate persistent asthma, uncomplicated: Secondary | ICD-10-CM

## 2023-11-23 NOTE — Telephone Encounter (Signed)
 Copied from CRM [id number]. Topic: Clinical - Medical Advice >> Nov 23, 2023  8:20 AM Myrick T wrote: Reason for CRM: patient called requested to speak with providers nurse, no answer at [facility]. Patient said she went to [facility] ED last Thursday with pain, diagnosed with kidney infection/flu. She asked for medication and ER provider said she would need to call PCP for antibiotic for kidney infection, flu meds and oxycodone  for the pain. Patient says she is 3 weeks home from [facility] right now and need nurse to fax a referral to pulmonologist at [facility] Dr Kennieth fax#[phone]. Please f/u with patient

## 2023-11-23 NOTE — Telephone Encounter (Signed)
-   Report to Emergency Department/Urgent Care/call 911 for immediate medical evaluation. Follow-up with Primary Care.  - Referral to Pulmonology complete.

## 2023-11-26 ENCOUNTER — Telehealth: Payer: Self-pay | Admitting: Family

## 2023-11-26 NOTE — Telephone Encounter (Signed)
 A document form has been faxed: Home Health Certificate (Order ID [id number]), to be filled out by provider. Send document back via Fax within 7-days. Document is located in providers tray at front office.          Fax number: [phone]

## 2023-11-27 ENCOUNTER — Encounter: Admitting: Family

## 2023-11-27 ENCOUNTER — Telehealth: Payer: Self-pay | Admitting: Family

## 2023-11-27 NOTE — Telephone Encounter (Signed)
 Called pt to reschedule missed appt; could not reach or leave vm due to full vm box

## 2023-11-27 NOTE — Progress Notes (Signed)
 Erroneous encounter-disregard

## 2023-11-28 ENCOUNTER — Other Ambulatory Visit (HOSPITAL_COMMUNITY): Payer: Self-pay

## 2023-11-28 ENCOUNTER — Other Ambulatory Visit: Payer: Self-pay

## 2023-11-28 ENCOUNTER — Emergency Department (HOSPITAL_COMMUNITY)
Admission: EM | Admit: 2023-11-28 | Discharge: 2023-11-28 | Disposition: A | Discharge status: Home / Self Care | Attending: Emergency Medicine | Admitting: Emergency Medicine

## 2023-11-28 ENCOUNTER — Emergency Department (HOSPITAL_COMMUNITY)

## 2023-11-28 ENCOUNTER — Encounter (HOSPITAL_COMMUNITY): Payer: Self-pay

## 2023-11-28 DIAGNOSIS — W010XXA Fall on same level from slipping, tripping and stumbling without subsequent striking against object, initial encounter: Secondary | ICD-10-CM | POA: Diagnosis not present

## 2023-11-28 DIAGNOSIS — W19XXXA Unspecified fall, initial encounter: Secondary | ICD-10-CM

## 2023-11-28 DIAGNOSIS — M25551 Pain in right hip: Secondary | ICD-10-CM | POA: Insufficient documentation

## 2023-11-28 DIAGNOSIS — J4540 Moderate persistent asthma, uncomplicated: Secondary | ICD-10-CM | POA: Insufficient documentation

## 2023-11-28 DIAGNOSIS — Z7951 Long term (current) use of inhaled steroids: Secondary | ICD-10-CM | POA: Diagnosis not present

## 2023-11-28 DIAGNOSIS — M25561 Pain in right knee: Secondary | ICD-10-CM | POA: Diagnosis not present

## 2023-11-28 LAB — URINALYSIS, ROUTINE W REFLEX MICROSCOPIC
Bilirubin Urine: NEGATIVE
Glucose, UA: NEGATIVE mg/dL
Hgb urine dipstick: NEGATIVE
Ketones, ur: NEGATIVE mg/dL
Leukocytes,Ua: NEGATIVE
Nitrite: NEGATIVE
Protein, ur: NEGATIVE mg/dL
Specific Gravity, Urine: 1.016 (ref 1.005–1.030)
pH: 7 (ref 5.0–8.0)

## 2023-11-28 LAB — CBC WITH DIFFERENTIAL/PLATELET
Abs Immature Granulocytes: 0.04 K/uL (ref 0.00–0.07)
Basophils Absolute: 0 K/uL (ref 0.0–0.1)
Basophils Relative: 0 %
Eosinophils Absolute: 0.1 K/uL (ref 0.0–0.5)
Eosinophils Relative: 1 %
HCT: 36.4 % (ref 36.0–46.0)
Hemoglobin: 10.9 g/dL — ABNORMAL LOW (ref 12.0–15.0)
Immature Granulocytes: 0 %
Lymphocytes Relative: 24 %
Lymphs Abs: 2.2 K/uL (ref 0.7–4.0)
MCH: 28.9 pg (ref 26.0–34.0)
MCHC: 29.9 g/dL — ABNORMAL LOW (ref 30.0–36.0)
MCV: 96.6 fL (ref 80.0–100.0)
Monocytes Absolute: 0.8 K/uL (ref 0.1–1.0)
Monocytes Relative: 8 %
Neutro Abs: 6 K/uL (ref 1.7–7.7)
Neutrophils Relative %: 67 %
Platelets: 303 K/uL (ref 150–400)
RBC: 3.77 MIL/uL — ABNORMAL LOW (ref 3.87–5.11)
RDW: 12.6 % (ref 11.5–15.5)
WBC: 9.3 K/uL (ref 4.0–10.5)
nRBC: 0 % (ref 0.0–0.2)

## 2023-11-28 LAB — BASIC METABOLIC PANEL WITH GFR
Anion gap: 9 (ref 5–15)
BUN: 13 mg/dL (ref 8–23)
CO2: 35 mmol/L — ABNORMAL HIGH (ref 22–32)
Calcium: 11.3 mg/dL — ABNORMAL HIGH (ref 8.9–10.3)
Chloride: 100 mmol/L (ref 98–111)
Creatinine, Ser: 0.92 mg/dL (ref 0.44–1.00)
GFR, Estimated: >60 mL/min (ref >60)
Glucose, Bld: 97 mg/dL (ref 70–99)
Potassium: 5.5 mmol/L — ABNORMAL HIGH (ref 3.5–5.1)
Sodium: 144 mmol/L (ref 135–145)

## 2023-11-28 MED ORDER — OXYCODONE HCL 5 MG PO TABS: 5.0000 mg | ORAL_TABLET | Freq: Four times a day (QID) | ORAL | 0 refills | Status: DC | PRN

## 2023-11-28 MED ORDER — OXYCODONE HCL 5 MG PO TABS
5.0000 mg | ORAL_TABLET | Freq: Four times a day (QID) | ORAL | 0 refills | Status: DC | PRN
  Filled 2023-11-28 – 2023-11-29 (×3): qty 10, 3d supply, fill #0

## 2023-11-28 MED ORDER — ACETAMINOPHEN 325 MG PO TABS
650.0000 mg | ORAL_TABLET | Freq: Once | ORAL | Status: AC
  Administered 2023-11-28: 650 mg via ORAL
  Filled 2023-11-28: qty 2

## 2023-11-28 MED ORDER — FENTANYL CITRATE PF 50 MCG/ML IJ SOSY
75.0000 ug | PREFILLED_SYRINGE | Freq: Once | INTRAMUSCULAR | Status: AC
  Administered 2023-11-28: 75 ug via INTRAVENOUS
  Filled 2023-11-28: qty 2

## 2023-11-28 MED ORDER — DICLOFENAC SODIUM 75 MG PO TBEC
75.0000 mg | DELAYED_RELEASE_TABLET | Freq: Two times a day (BID) | ORAL | 0 refills | Status: DC
  Filled 2023-11-28 – 2023-11-29 (×3): qty 20, 10d supply, fill #0

## 2023-11-28 MED ORDER — FENTANYL CITRATE PF 50 MCG/ML IJ SOSY
50.0000 ug | PREFILLED_SYRINGE | Freq: Once | INTRAMUSCULAR | Status: AC
  Administered 2023-11-28: 50 ug via INTRAVENOUS
  Filled 2023-11-28: qty 1

## 2023-11-28 NOTE — ED Notes (Signed)
 PT complained about the position of her right knee that was causing her pain.  We repositioned her leg and placed a pillow under it to take off pressure.  While we were repositioning her leg she started complaining of pain in her tail bone.  We roller her on to her left side side and placed more pillows underneath her to relieve pressure.  Notified EDP

## 2023-11-28 NOTE — ED Provider Notes (Signed)
 [place] EMERGENCY DEPARTMENT AT [facility] Provider Note   CSN: [id number] Arrival date & time: 11/28/23  9789     Patient presents with: Janice Ryan is a 68 y.o. female.  Patient with past medical history significant for moderate persistent asthma, chronic pain of right knee, chronic respiratory failure with hypoxia on 4 L/min at baseline, generalized anxiety disorder, falls,  poor social situation, morbid obesity presents to the emergency department secondary to a fall.  Patient states that she was trying to get to her bedside commode when she slipped and fell backwards landing on her buttocks.  She complains of right sided hip and knee pain.  She denies hitting her head and denies loss of consciousness.  During a reassessment the patient also endorsed feeling mildly short of breath, states she believes she may have a UTI, and states she does not believe she can go home yet due to no caregiver currently being at her house.    Fall       Prior to Admission medications   Medication Sig Start Date End Date Taking? Authorizing Provider  acetaminophen  (TYLENOL ) 500 MG tablet Take 2 tablets (1,000 mg total) by mouth every 6 (six) hours as needed for mild pain (pain score 1-3). 09/13/23   Will Almarie MATSU, MD  albuterol  (VENTOLIN  HFA) 108 [phone] Base) MCG/ACT inhaler Inhale 2 puffs into the lungs every 6 (six) hours as needed for wheezing or shortness of breath. 09/13/23   Will Almarie MATSU, MD  amphetamine-dextroamphetamine (ADDERALL XR) 20 MG 24 hr capsule Take 20 mg by mouth daily.    [provider]  busPIRone  (BUSPAR ) 15 MG tablet Take 15 mg by mouth 3 (three) times daily.    [provider]  cetirizine  (ZYRTEC  ALLERGY) 10 MG tablet Take 1 tablet (10 mg total) by mouth daily. 01/03/23   Lorren Greig PARAS, NP  cholecalciferol  (VITAMIN D3) 25 MCG (1000 UNIT) tablet Take 1,000 Units by mouth daily.    [provider]  doxepin   (SINEQUAN ) 50 MG capsule Take 1 capsule (50 mg total) by mouth in the morning. 07/26/23   Odell Balls, PA-C  DULoxetine  (CYMBALTA ) 60 MG capsule Take 2 capsules (120 mg total) by mouth daily. 02/15/23   Regalado, Belkys A, MD  fluticasone  (FLONASE ) 50 MCG/ACT nasal spray Place 2 sprays into both nostrils daily. 02/20/22   Donzetta Rollene BRAVO, MD  fluticasone -salmeterol (ADVAIR) 500-50 MCG/ACT AEPB Inhale 1 puff into the lungs in the morning and at bedtime. 08/01/22   Mayers, Cari S, PA-C  furosemide  (LASIX ) 40 MG tablet Take 1 tablet (40 mg total) by mouth daily. 10/09/23 10/08/24  Vernon Ranks, MD  gabapentin  (NEURONTIN ) 300 MG capsule Take 1 capsule (300 mg total) by mouth at bedtime. 08/20/23 11/18/23  Lorren Greig PARAS, NP  mirtazapine  (REMERON ) 15 MG tablet Take 1 tablet (15 mg total) by mouth at bedtime. 07/26/23   Odell Balls, PA-C  montelukast  (SINGULAIR ) 10 MG tablet Take 1 tablet (10 mg total) by mouth at bedtime. Patient not taking: Reported on 09/28/2023 01/16/23   Lorren Greig PARAS, NP  Multiple Vitamin (MULTIVITAMIN WITH MINERALS) TABS tablet Take 1 tablet by mouth daily.    [provider]  Omega-3 Fatty Acids (OMEGA-3 PO) Take 1 capsule by mouth daily.    [provider]  oxyCODONE  (OXY IR/ROXICODONE ) 5 MG immediate release tablet Take 1 tablet (5 mg total) by mouth every 6 (six) hours as needed for severe pain (pain score  7-10). 10/09/23   Vernon Ranks, MD  oxyCODONE -acetaminophen  (PERCOCET) 5-325 MG tablet Take 1 every 6 hours for pain not relieved by Tylenol  or Motrin  along 09/26/23   Zammit, Joseph, MD  OXYGEN Inhale 5 L into the lungs continuous.    [provider]  pantoprazole  (PROTONIX ) 40 MG tablet Take 1 tablet (40 mg total) by mouth daily. 03/07/23   Elgergawy, Brayton RAMAN, MD  potassium chloride  (KLOR-CON  M) 10 MEQ tablet Take 1 tablet (10 mEq total) by mouth daily. 10/09/23 11/08/23  Vernon Ranks, MD  traZODone  (DESYREL ) 50 MG tablet Take 50 mg by mouth at bedtime  as needed for sleep.    [provider]    Allergies: Sulfa antibiotics    Review of Systems  Updated Vital Signs BP 124/72 (BP Location: Left Arm)   Pulse 69   Temp 98.7 F (37.1 C) (Oral)   Resp 20   Ht 5' 8 (1.727 m)   Wt (!) 197.2 kg   SpO2 95%   BMI 66.10 kg/m   Physical Exam Vitals and nursing note reviewed.  Constitutional:      General: She is not in acute distress.    Appearance: She is well-developed. She is obese.  HENT:     Head: Normocephalic and atraumatic.  Eyes:     Conjunctiva/sclera: Conjunctivae normal.  Cardiovascular:     Rate and Rhythm: Normal rate and regular rhythm.     Heart sounds: No murmur heard. Pulmonary:     Effort: Pulmonary effort is normal. No respiratory distress.     Breath sounds: Normal breath sounds.  Abdominal:     Palpations: Abdomen is soft.     Tenderness: There is no abdominal tenderness.  Musculoskeletal:        General: Tenderness present. No swelling or deformity. Normal range of motion.     Cervical back: Neck supple.     Comments: Grossly normal range of motion of the right knee and hip.  Patient does have significant pain in the right knee with passive range of motion.  No significant swelling or effusion appreciated.  Skin:    General: Skin is warm and dry.     Capillary Refill: Capillary refill takes less than 2 seconds.  Neurological:     Mental Status: She is alert.  Psychiatric:        Mood and Affect: Mood normal.     (all labs ordered are listed, but only abnormal results are displayed) Labs Reviewed  BASIC METABOLIC PANEL WITH GFR - Abnormal; Notable for the following components:      Result Value   Potassium 5.5 (*)    CO2 35 (*)    Calcium 11.3 (*)    All other components within normal limits  CBC WITH DIFFERENTIAL/PLATELET - Abnormal; Notable for the following components:   RBC 3.77 (*)    Hemoglobin 10.9 (*)    MCHC 29.9 (*)    All other components within normal limits  URINALYSIS,  ROUTINE W REFLEX MICROSCOPIC    EKG: EKG Interpretation Date/Time:  Wednesday November 28 2023 06:25:02 EDT Ventricular Rate:  83 PR Interval:  141 QRS Duration:  87 QT Interval:  347 QTC Calculation: 408 R Axis:   60  Text Interpretation: Sinus rhythm Consider left atrial enlargement Abnormal R-wave progression, early transition Baseline wander in lead(s) II Confirmed by Griselda Norris [phone]) on 11/28/2023 6:27:14 AM  Radiology: DG Chest 2 View Result Date: 11/28/2023 EXAM: 2 VIEW(S) XRAY OF THE CHEST 11/28/2023 05:30:00  AM COMPARISON: 09/30/2023 CLINICAL HISTORY: Shortness of breath. FINDINGS: LUNGS AND PLEURA: Motion artifact and patient body habitus limits assessment of lung detail. Mild increased interstitial opacities. No significant pleural effusion or pneumothorax HEART AND MEDIASTINUM: Cardiomegaly and vascular pedicle widening secondary to pulmonary artery enlargement likely reflecting chronic PA hypertension. BONES AND SOFT TISSUES: No acute osseous abnormality. IMPRESSION: 1. Increased and interstitial opacities which may reflect mild edema. 2. 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 Electronically signed by: Waddell Calk MD 11/28/2023 06:06 AM EDT RP Workstation: [id number]   DG Knee Complete 4 Views Right Result Date: 11/28/2023 EXAM: 4 VIEW(S) XRAY OF THE RIGHT KNEE 11/28/2023 04:22:28 AM COMPARISON: None available. CLINICAL HISTORY: Fall, right hip/knee pain. Patient fell getting out of bed to bedside toilet, fell to knees, pain radiates from anterior knee to hip; Patient fell getting out of bed to bedside toilet, fell to knees, pain radiates from anterior knee to hip, best films obtainable due to body habitus. FINDINGS: BONES AND JOINTS: No acute fracture. No focal osseous lesion. No joint dislocation. No significant joint effusion. 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 There is mild genu varus. SOFT TISSUES: The soft tissues are unremarkable. IMPRESSION: 1. No acute fracture or dislocation. 2. Severe tricompartmental osteoarthritis with complete loss of the medial tibial femoral compartment and extensive osteophytosis and sclerosis, particularly involving the medial knee compartment and patellofemoral joint. 3. Mild genu varus. Electronically signed by: Evalene Coho MD 11/28/2023 04:54 AM EDT RP Workstation: [id number]   DG Hip Unilat W or Wo Pelvis 2-3 Views Right Result Date: 11/28/2023 EXAM: 2 or 3 VIEW(S) XRAY OF THE RIGHT HIP 11/28/2023 04:22:28 AM COMPARISON: CT of the right hip dated 09/26/2023. CLINICAL HISTORY: Fall, right hip/knee pain. Patient fell getting out of bed to bedside toilet, fell to knees, pain radiates from anterior knee to hip, best films obtainable due to body habitus. FINDINGS: The study is limited by large body habitus. BONES AND JOINTS: No acute fracture or focal osseous lesion. The hip joint is maintained. No significant degenerative changes. SOFT TISSUES: The soft tissues are unremarkable. IMPRESSION: 1. No acute fracture or dislocation of the right hip. 2. Evaluation limited by large body habitus. Electronically signed by: Evalene Coho MD 11/28/2023 04:52 AM EDT RP Workstation: [id number]     Procedures   Medications Ordered in the ED  fentaNYL  (SUBLIMAZE ) injection 75 mcg (75 mcg Intravenous Given 11/28/23 0440)  acetaminophen  (TYLENOL ) tablet 650 mg (650 mg Oral Given 11/28/23 9367)                                    Medical Decision Making Amount and/or Complexity of Data Reviewed Labs: ordered. Radiology: ordered.  Risk Prescription drug management.   This patient presents to the ED for concern of right lower extremity pain, this involves an  extensive number of treatment options, and is a complaint that carries with it a high risk of complications and morbidity.  The differential diagnosis includes fracture, dislocation, soft tissue injury, others.  Patient then endorses dysuria and states she believes her breathing may be off.  Differential includes UTI, urinary frequency, others   Co morbidities / Chronic conditions that complicate the patient evaluation  Obesity, chronic respiratory failure, chronic right knee pain  Additional history obtained:  Additional history obtained from EMR External records from outside source obtained and reviewed including admission notes from June   Lab Tests:  I Ordered, and personally interpreted labs.  The pertinent results include: CBC at baseline   Imaging Studies ordered:  I ordered imaging studies including plain films of the right hip, knee, chest I independently visualized and interpreted imaging which showed no acute findings on the hip or knee films.  Severe tricompartmental osteoarthritis on the right knee x-ray.  Chest x-ray shows  1. Increased and interstitial opacities which may reflect mild edema.  2. Cardiomegaly and vascular pedicle widening secondary to pulmonary artery  enlargement, likely reflecting chronic PA hypertension.   I agree with the radiologist interpretation   Cardiac Monitoring: / EKG:  The patient was maintained on a cardiac monitor.  I personally viewed and interpreted the cardiac monitored which showed an underlying rhythm of: sinus rhythm   Problem List / ED Course / Critical interventions / Medication management   I ordered medication including fentanyl    Reevaluation of the patient after these medicines showed that the patient improved   Test / Admission - Considered:  Patient initially evaluated after a reported mechanical fall with no acute fracture or dislocation appreciated.  Upon telling patient of her results she then states what  about my breathing which was not mentioned during her initial triage.  She stated that she feels that her breathing is sometimes off and that she has to use oxygen or that her O2 sats will be in the 80% range.  I explained that she is currently on less than her 4 L/min baseline oxygen at home and is maintaining saturations of 95+ percent.  The patient then tells me that she thinks she may have a UTI.  Chest x-ray showing possible mild edema but lungs are clear to auscultation bilaterally and patient is maintaining oxygen saturations of 95+ percent on less than her prescribed oxygen.  She is not having chest pain.  No pneumonia noted on x-ray.  Potassium is mildly elevated at 5.5.  Patient endorses taking 20 mill equivalents daily of potassium but appears to be prescribed 10 mEq.  Will recommend that patient reduce dosage back to the prescribed 10 mEq.  Patient care being transferred to [facility], PA-C at shift handoff.  Disposition pending ambulatory trial, results of UA and EKG pending      Final diagnoses:  None    ED Discharge Orders     None          Logan Ubaldo KATHEE DEVONNA 11/28/23 9362    Griselda Norris, MD 11/28/23 [phone]

## 2023-11-28 NOTE — ED Notes (Signed)
 Wrapped right knee with ace bandages per EDP verbal

## 2023-11-28 NOTE — ED Notes (Signed)
 Pt given sausage biscuit and orange juice

## 2023-11-28 NOTE — ED Provider Notes (Signed)
 Signout given from [employer], PA-C.  In short.  Patient is a 68 year old female with history of moderate persistent asthma, chronic respiratory failure with hypoxia on 4 L baseline, morbid obesity, chronic right knee pain with severe arthritis who presents following a fall.  Patient was trying to get to her bedside commode when she slipped and fell landing on her buttocks.  Primarily complains of right knee pain.  Denies hitting her head or loss consciousness.  Her imaging was without any acute significant abnormality.  At time of signout UA is pending as patient is concerned whether she has a urinary tract infection.  Also reports she felt a little short of breath earlier.  Although her chest x-ray appears unchanged and she is at her baseline oxygen requirement.  Physical Exam  BP (!) 116/46   Pulse 96   Temp 98.7 F (37.1 C) (Oral)   Resp 15   Ht 5' 8 (1.727 m)   Wt (!) 197.2 kg   SpO2 98%   BMI 66.10 kg/m   Physical Exam Vitals and nursing note reviewed.  Constitutional:      General: She is not in acute distress.    Appearance: She is well-developed.  HENT:     Head: Normocephalic and atraumatic.  Eyes:     Conjunctiva/sclera: Conjunctivae normal.  Cardiovascular:     Rate and Rhythm: Normal rate and regular rhythm.     Heart sounds: No murmur heard. Pulmonary:     Effort: Pulmonary effort is normal. No respiratory distress.     Breath sounds: Normal breath sounds.  Abdominal:     Palpations: Abdomen is soft.     Tenderness: There is no abdominal tenderness.  Musculoskeletal:        General: No swelling.     Cervical back: Neck supple.     Comments: Generalized tenderness to right knee, no significant tenderness to right hip, compartments are soft, DP/PT pulses 2+, tolerates full range of motion with some discomfort  Skin:    General: Skin is warm and dry.     Capillary Refill: Capillary refill takes less than 2 seconds.  Neurological:     Mental Status: She is alert.   Psychiatric:        Mood and Affect: Mood normal.     Procedures  Procedures  ED Course / MDM    Medical Decision Making Amount and/or Complexity of Data Reviewed Labs: ordered. Radiology: ordered.  Risk OTC drugs. Prescription drug management.   UA is unremarkable, will provide patient with Ace bandage for her chronic knee pain, has been ambulatory to her commode, will discharge home encouraged to follow-up with her orthopedic and continue her rehab.       Donnajean Lynwood DEL, PA-C 11/28/23 9282    Griselda Norris, MD 11/28/23 [phone]

## 2023-11-28 NOTE — Discharge Instructions (Signed)
 You were evaluated in the emergency room following a fall.  Your imaging did not show any acute abnormality.  Please reduce your potassium supplementation to 10 mg a day.  Prescription for oxycodone  and diclofenac  has been sent into your pharmacy.  Please follow-up with your orthopedic and continue rehab.  Your urine had no evidence of an infection.  Remainder of your lab work was additionally without any significant abnormality.

## 2023-11-28 NOTE — ED Notes (Signed)
Transport contacted

## 2023-11-28 NOTE — ED Triage Notes (Addendum)
 Pt BIBA from home, c/o mechanical fall. Getting up to use bedside and landed on both knees.  Right knee pain radiating up to hip. Denies back or neck pain, loc, blood thinners. Has not taken potassium pills lately and started taking them again yesterday. Pt stated she has been using bathroom more frequently but denies any pain.   HR 90 O2 98 4L baseline

## 2023-11-29 ENCOUNTER — Telehealth: Payer: Self-pay | Admitting: Family

## 2023-11-29 ENCOUNTER — Other Ambulatory Visit: Payer: Self-pay

## 2023-11-29 ENCOUNTER — Other Ambulatory Visit (HOSPITAL_COMMUNITY): Payer: Self-pay

## 2023-11-29 NOTE — Telephone Encounter (Signed)
 Copied from CRM [id number]. Topic: Clinical - Prescription Issue >> Nov 29, 2023 10:39 AM Debby BROCKS wrote: Reason for CRM: Patient was advised by the pharmacy to call her clinic and reorder her medications. However patient states she was in the hospital when everything was provided and cannot remember the name of any of the medications. Wants to speak to a nurse to see what needs to be refilled

## 2023-11-30 NOTE — Telephone Encounter (Signed)
Schedule hospital discharge follow up appointment.

## 2023-12-04 NOTE — Telephone Encounter (Signed)
 Faxed back on 11/28/2023

## 2023-12-05 NOTE — Telephone Encounter (Signed)
 Pt called back following up on medication request, says the pharmacy will not give her the oxycodone /diclofenac  that was called in 11/28/2023. Says she also needs something to help treat her overactive bladder, says she is urinating too frequently still. Wants to speak to the clinic.

## 2023-12-05 NOTE — Telephone Encounter (Signed)
 Pt scheduled

## 2023-12-05 NOTE — Telephone Encounter (Signed)
 Faxed back on 12/04/2023

## 2023-12-06 NOTE — Telephone Encounter (Signed)
 Pt scheduled for appt on 09/17. Can pt get refill until then? Please advise pt.

## 2023-12-07 ENCOUNTER — Other Ambulatory Visit: Payer: Self-pay

## 2023-12-07 ENCOUNTER — Other Ambulatory Visit: Payer: Self-pay | Admitting: Family

## 2023-12-07 DIAGNOSIS — R52 Pain, unspecified: Secondary | ICD-10-CM

## 2023-12-07 MED ORDER — DICLOFENAC SODIUM 75 MG PO TBEC
75.0000 mg | DELAYED_RELEASE_TABLET | Freq: Two times a day (BID) | ORAL | 0 refills | Status: DC
  Filled 2023-12-07: qty 20, 10d supply, fill #0

## 2023-12-07 NOTE — Telephone Encounter (Signed)
-   Diclofenac  refilled per patient request.  - Unable to prescribe/refill Oxycodone  per [facility] office policy.  - No record of urinary incontinence medication on patient's medication list.

## 2023-12-10 ENCOUNTER — Ambulatory Visit: Admitting: Internal Medicine

## 2023-12-10 ENCOUNTER — Other Ambulatory Visit: Payer: Self-pay | Admitting: Family

## 2023-12-10 ENCOUNTER — Encounter: Payer: Self-pay | Admitting: Family

## 2023-12-10 NOTE — Telephone Encounter (Signed)
 I am unable to prescribe Oxycodone -Acetaminophen  due to [facility] office policy.

## 2023-12-10 NOTE — Telephone Encounter (Signed)
 Copied from CRM [id number]. Topic: Clinical - Medication Refill >> Dec 10, 2023  9:16 AM Rosaria E wrote: Medication:  oxyCODONE  (OXY IR/ROXICODONE ) 5 MG immediate release tablet - Says she needs this from [place]?   Also says she is missing an antibiotic for a kidney infection.  Has the patient contacted their pharmacy? Yes (Agent: If no, request that the patient contact the pharmacy for the refill. If patient does not wish to contact the pharmacy document the reason why and proceed with request.) (Agent: If yes, when and what did the pharmacy advise?)  This is the patient's preferred pharmacy:   [place] [facility] [address] [place] [address] Phone: [phone] Fax: [phone]  Is this the correct pharmacy for this prescription? Yes If no, delete pharmacy and type the correct one.   Has the prescription been filled recently? Yes  Is the patient out of the medication? Yes  Has the patient been seen for an appointment in the last year OR does the patient have an upcoming appointment? Yes  Can we respond through MyChart? Yes  Agent: Please be advised that Rx refills may take up to 3 business days. We ask that you follow-up with your pharmacy.

## 2023-12-10 NOTE — Telephone Encounter (Signed)
 FYI Only or Action Required?: Action required by provider: medication refill request.  Patient was last seen in primary care on 06/25/2023 by Lorren Greig PARAS, NP.  Called Nurse Triage reporting No chief complaint on file..  Symptoms began today.  Interventions attempted: Nothing.  Symptoms are: stable.  Triage Disposition: No disposition on file.  Patient/caregiver understands and will follow disposition?:

## 2023-12-10 NOTE — Telephone Encounter (Signed)
 This encounter was created in error - please disregard.

## 2023-12-11 ENCOUNTER — Telehealth: Payer: Self-pay | Admitting: Emergency Medicine

## 2023-12-11 ENCOUNTER — Other Ambulatory Visit (HOSPITAL_COMMUNITY): Payer: Self-pay

## 2023-12-11 ENCOUNTER — Other Ambulatory Visit: Payer: Self-pay | Admitting: Family

## 2023-12-11 ENCOUNTER — Ambulatory Visit: Payer: Self-pay

## 2023-12-11 NOTE — Telephone Encounter (Signed)
 Patient returned call, patient requesting callback, [phone]

## 2023-12-11 NOTE — Telephone Encounter (Signed)
 FYI Only or Action Required?: Action required by provider: Patient requesting meds.  Patient was last seen in primary care on 06/25/2023 by Lorren Greig PARAS, NP.  Called Nurse Triage reporting Urinary Frequency.  Symptoms began about a month ago.  Interventions attempted: Nothing.  Symptoms are: gradually worsening.  Triage Disposition: See Physician Within 24 Hours  Patient/caregiver understands and will follow disposition?: No, wishes to speak with PCP    Copied from CRM #[id number]. Topic: Clinical - Red Word Triage >> Dec 11, 2023 11:07 AM Turkey B wrote: [facility] that prompted transfer to Nurse Triage: patient called in, says had dark urine, and pain at a 7 and frequent urination Reason for Disposition  Urinating more frequently than usual (i.e., frequency) OR new-onset of the feeling of an urgent need to urinate (i.e., urgency)  Answer Assessment - Initial Assessment Questions Lower back pain with inability to hold urine. Patient states she has to wait until the 16th for symptoms. Patient is requesting an antibiotic.Patient refusing an in office appointment and states she just wants medication. Patient also requesting medication for pain (oxycodone ). Patient was very upset during call and hung up before more information could be obtained. Will route to office for follow up.    1. SYMPTOM: What's the main symptom you're concerned about? (e.g., frequency, incontinence)     Frequency 2. ONSET: When did the  urine symptoms  start?     1 month  3. PAIN: Is there any pain? If Yes, ask: How bad is it? (Scale: 1-10; mild, moderate, severe)     Patient states pain is a 7 out of 10  4. CAUSE: What do you think is causing the symptoms?     Possible UTI  5. OTHER SYMPTOMS: Do you have any other symptoms? (e.g., blood in urine, fever, flank pain, pain with urination)     Lower back pain, foul odor,  Protocols used: Urinary Symptoms-A-AH

## 2023-12-11 NOTE — Telephone Encounter (Unsigned)
 Copied from CRM [id number]. Topic: General - Other >> Dec 07, 2023 12:35 PM Cleave MATSU wrote: Reason for CRM: therapist stated that they have not been able to reach Janice Ryan therapist stated August 7th was the last time she went to see pt

## 2023-12-11 NOTE — Telephone Encounter (Signed)
 I called patient and made her aware that her therapist was trying to contact her.

## 2023-12-11 NOTE — Telephone Encounter (Signed)
 Noted. Also, please try and call patient to let her know that therapist called her.

## 2023-12-11 NOTE — Telephone Encounter (Unsigned)
 Copied from CRM [id number]. Topic: Clinical - Medication Refill >> Dec 11, 2023  3:16 PM Debby BROCKS wrote: Medication:  DULoxetine  (CYMBALTA ) 60 MG capsule doxepin  (SINEQUAN ) 50 MG capsule amphetamine-dextroamphetamine (ADDERALL XR) 20 MG 24 hr capsule mirtazapine  (REMERON ) 15 MG tablet busPIRone  (BUSPAR ) 15 MG tablet   Has the patient contacted their pharmacy? No (Agent: If no, request that the patient contact the pharmacy for the refill. If patient does not wish to contact the pharmacy document the reason why and proceed with request.) (Agent: If yes, when and what did the pharmacy advise?)  This is the patient's preferred pharmacy:  [place] [facility] [address] [place] [address] Phone: [phone] Fax: [phone]  Is this the correct pharmacy for this prescription? Yes If no, delete pharmacy and type the correct one.   Has the prescription been filled recently? No  Is the patient out of the medication? No  Has the patient been seen for an appointment in the last year OR does the patient have an upcoming appointment? Yes  Can we respond through MyChart? Yes  Agent: Please be advised that Rx refills may take up to 3 business days. We ask that you follow-up with your pharmacy.

## 2023-12-12 ENCOUNTER — Telehealth: Payer: Self-pay | Admitting: Emergency Medicine

## 2023-12-12 ENCOUNTER — Telehealth: Payer: Self-pay

## 2023-12-12 NOTE — Telephone Encounter (Signed)
 Copied from CRM #[id number]. Topic: Clinical - Red Word Triage >> Dec 11, 2023 11:07 AM Turkey B wrote: [facility] that prompted transfer to Nurse Triage: patient called in, says had dark urine, and pain at a 7 and frequent urination >> Dec 12, 2023 12:05 PM Zebedee SAUNDERS wrote: Pt stated no has call her back or sent any medication to pharmacy. Please call pt at [phone]. [place] [facility]  [address] [place] [address]  Phone: [phone] Fax: [phone]

## 2023-12-12 NOTE — Telephone Encounter (Signed)
 Schedule appointment?

## 2023-12-12 NOTE — Telephone Encounter (Signed)
 Copied from CRM [id number]. Topic: Clinical - Red Word Triage >> Dec 11, 2023 11:25 AM Tobias CROME wrote: Red Word that prompted transfer to Nurse Triage: Patient states she has a kidney infection.Patient requesting virtual appointment with [name]. Patient spoke to NT and disconnected call, patient requesting call directly from [name].   Patient states she is not having a very good day. Patient would also like assistance with finding a new behavioral therapist.   Patient requesting callback: [phone]

## 2023-12-12 NOTE — Telephone Encounter (Signed)
 1st attempt, no answer. Voicemail box is full, unable to leave message.   Copied from CRM #[id number]. Topic: Clinical - Red Word Triage >> Dec 11, 2023 11:07 AM Turkey B wrote: [facility] that prompted transfer to Nurse Triage: patient called in, says had dark urine, and pain at a 7 and frequent urination >> Dec 12, 2023 12:05 PM Zebedee SAUNDERS wrote: Pt stated no has call her back or sent any medication to pharmacy. Please call pt at [phone]. [place] [facility]  [address] [place] [address]  Phone: [phone] Fax: [phone]

## 2023-12-12 NOTE — Telephone Encounter (Signed)
 Patient is not seen at this facility  Please route to the correct facility. Thank you  Copied from CRM [id number]. Topic: General - Other >> Dec 11, 2023  3:15 PM Janice Ryan wrote: Reason for CRM: Patient wants a Child psychotherapist to reach out to her due to needing to find a behavioralist

## 2023-12-12 NOTE — Telephone Encounter (Signed)
 Copied from CRM [id number]. Topic: Clinical - Home Health Verbal Orders >> Dec 11, 2023  3:02 PM Deleta RAMAN wrote: Caller/Agency: [facility] Callback Number: [phone] Service Requested: physical therapy evaluation Frequency: unsure  Any new concerns about the patient? Yes not seen for more than 30 days and significant changes  I return called to [facility] stated there was no one in the office and they would return my call tomorrow

## 2023-12-12 NOTE — Telephone Encounter (Signed)
 Closing encounter, see addended nurse triage from yesterday for additional notes.

## 2023-12-12 NOTE — Telephone Encounter (Signed)
 Complete

## 2023-12-13 NOTE — Telephone Encounter (Signed)
 Requested medication (s) are due for refill today: -  Requested medication (s) are on the active medication list: yes but some meds are historical  Last refill:   Duloxetine : 02/05/23 #30 caps 3 RF Doxepin  07/26/23 # 10 caps Adderall: 09/10/23 hx med Mirtazepine: 07/26/23 #10 caps Buspirone : 09/11/23 hx med   Future visit scheduled: yes  Notes to clinic:  all meds except for Adderall and buspirone  were prescribed at hospital discharge    Requested Prescriptions  Pending Prescriptions Disp Refills   DULoxetine  (CYMBALTA ) 60 MG capsule 30 capsule 3    Sig: Take 2 capsules (120 mg total) by mouth daily.     Psychiatry: Antidepressants - SNRI - duloxetine  Failed - 12/13/2023  8:54 AM      Failed - Completed PHQ-2 or PHQ-9 in the last 360 days      Failed - Last BP in normal range    BP Readings from Last 1 Encounters:  11/28/23 (!) 116/46         Passed - Cr in normal range and within 360 days    Creatinine, Ser  Date Value Ref Range Status  11/28/2023 0.92 0.44 - 1.00 mg/dL Final   Creatinine, Urine  Date Value Ref Range Status  09/30/2023 153 mg/dL Final    Comment:    Performed at [facility], [address] [place], [place] [address]         Passed - eGFR is 30 or above and within 360 days    GFR calc Af Amer  Date Value Ref Range Status  08/08/2019 >60 >60 mL/min Final   GFR, Estimated  Date Value Ref Range Status  11/28/2023 >60 >60 mL/min Final    Comment:    (NOTE) Calculated using the CKD-EPI Creatinine Equation (2021)    eGFR  Date Value Ref Range Status  06/25/2023 50 (L) >59 mL/min/1.73 Final         Passed - Valid encounter within last 6 months    Recent Outpatient Visits           7 months ago Hospital discharge follow-up   [facility] at [facility] [place], [place]   9 months ago Medication refill   [place] [facility] at [facility], [name] J, NP   1 year ago Asthma, unspecified asthma  severity, unspecified whether complicated, unspecified whether persistent   [place] [facility] at [facility], [name] J, NP   1 year ago Chronic frontal sinusitis   [place] Primary Care at [facility], [name] J, NP   2 years ago Chronic frontal sinusitis   [place] [facility] at [facility], [name] J, NP               doxepin  (SINEQUAN ) 50 MG capsule 10 capsule 0    Sig: Take 1 capsule (50 mg total) by mouth in the morning.     Psychiatry:  Antidepressants - Heterocyclics (TCAs) Failed - 12/13/2023  8:54 AM      Failed - Completed PHQ-2 or PHQ-9 in the last 360 days      Passed - Valid encounter within last 6 months    Recent Outpatient Visits           7 months ago Hospital discharge follow-up   [facility] at [facility], [place], NP   9 months ago Medication refill   [facility]  [name] J, NP   1 year ago Asthma, unspecified asthma severity, unspecified whether complicated, unspecified whether persistent   [place] [facility] at [facility], [place], NP   1 year ago Chronic frontal sinusitis   [place] [facility] Care at [facility], [name] J, NP   2 years ago Chronic frontal sinusitis   [place] [facility] at [facility], [place], [place]             Psychiatry:  Antidepressants - Heterocyclics (TCAs) - doxepin  Passed - 12/13/2023  8:54 AM      Passed - Valid encounter within last 12 months    Recent Outpatient Visits           7 months ago Hospital discharge follow-up   [facility] at [facility], [place], NP   9 months ago Medication refill   [place] [facility] at [facility], [name] J, NP   1 year ago Asthma, unspecified asthma severity, unspecified whether complicated, unspecified whether persistent   [place] [facility] at [facility], [place], [place]   1 year ago Chronic  frontal sinusitis   [place] [facility] at [facility], [name] J, NP   2 years ago Chronic frontal sinusitis   [place] [facility] at [facility], [name] J, NP               amphetamine-dextroamphetamine (ADDERALL XR) 20 MG 24 hr capsule  0    Sig: Take 1 capsule (20 mg total) by mouth daily.     Not Delegated - Psychiatry:  Stimulants/ADHD Failed - 12/13/2023  8:54 AM      Failed - This refill cannot be delegated      Failed - Urine Drug Screen completed in last 360 days      Failed - Last BP in normal range    BP Readings from Last 1 Encounters:  11/28/23 (!) 116/46         Passed - Last Heart Rate in normal range    Pulse Readings from Last 1 Encounters:  11/28/23 96         Passed - Valid encounter within last 6 months    Recent Outpatient Visits           7 months ago Hospital discharge follow-up   [facility], [place], [place]   9 months ago Medication refill   [place] Primary Care at [facility], [name] J, NP   1 year ago Asthma, unspecified asthma severity, unspecified whether complicated, unspecified whether persistent   [place] [facility] Care at [facility], [name] J, NP   1 year ago Chronic frontal sinusitis   [place] [facility] Care at [facility], [name] J, NP   2 years ago Chronic frontal sinusitis   [place] [facility] Care at [facility], [name] J, NP               mirtazapine  (REMERON ) 15 MG tablet 10 tablet 0    Sig: Take 1 tablet (15 mg total) by mouth at bedtime.     Psychiatry: Antidepressants - mirtazapine  Failed - 12/13/2023  8:54 AM      Failed - Completed PHQ-2 or PHQ-9 in the last 360 days      Passed - Valid encounter within last 6 months    Recent Outpatient Visits  7 months ago Hospital discharge follow-up   [facility], [place], [place]   9 months ago Medication refill   [facility], [name] J, NP   1 year ago Asthma, unspecified asthma severity, unspecified whether complicated, unspecified whether persistent   [place] [facility] Care at [facility], [place], NP   1 year ago Chronic frontal sinusitis   [place] Primary Care at [facility], [name] J, NP   2 years ago Chronic frontal sinusitis   [place] Primary Care at [facility], [name] J, NP               busPIRone  (BUSPAR ) 15 MG tablet      Sig: Take 1 tablet (15 mg total) by mouth 3 (three) times daily.     Psychiatry: Anxiolytics/Hypnotics - Non-controlled Passed - 12/13/2023  8:54 AM      Passed - Valid encounter within last 12 months    Recent Outpatient Visits           7 months ago Hospital discharge follow-up   [facility] at [facility], [place], [place]   9 months ago Medication refill   [place] [facility] at [facility], [name] J, NP   1 year ago Asthma, unspecified asthma severity, unspecified whether complicated, unspecified whether persistent   [place] Primary Care at [facility], [place], NP   1 year ago Chronic frontal sinusitis   [place] Primary Care at [facility], [name] J, NP   2 years ago Chronic frontal sinusitis    Primary Care at [facility], Greig PARAS, NP

## 2023-12-14 ENCOUNTER — Other Ambulatory Visit: Payer: Self-pay | Admitting: Family

## 2023-12-14 DIAGNOSIS — F32A Depression, unspecified: Secondary | ICD-10-CM

## 2023-12-14 NOTE — Telephone Encounter (Signed)
 Darnelle with [facility] called to get verbal order to go out and re access patient for therapy because she requested to stop it for a few weeks

## 2023-12-14 NOTE — Telephone Encounter (Signed)
 I spoke with patient and made them aware of their recommendations per PCP.  Patient verbalized understanding

## 2023-12-14 NOTE — Telephone Encounter (Signed)
-   Duloxetine  last prescribed 02/15/23 by Regalado, Belkys A, MD.  - Doxepin  and Mirtazapine  last prescribed 07/26/23 by Odell Balls, PA-C.  - Buspirone  appears as a historical medication. - I am unable to prescribe Amphetamine-Dextroamphetamine per Primary Care [facility] office policy.  - Last office visit at [facility] on 05/04/2023. Recommendation for patient to report to Emergency Department/Urgent Care/call 911 for immediate medical evaluation. Follow-up with Primary Care.

## 2023-12-14 NOTE — Telephone Encounter (Signed)
 Complete

## 2023-12-17 ENCOUNTER — Telehealth: Payer: Self-pay | Admitting: Family

## 2023-12-17 ENCOUNTER — Other Ambulatory Visit (HOSPITAL_COMMUNITY): Payer: Self-pay

## 2023-12-17 MED ORDER — DICLOFENAC SODIUM 50 MG PO TBEC
50.0000 mg | DELAYED_RELEASE_TABLET | Freq: Every day | ORAL | 3 refills | Status: DC
  Filled 2023-12-25: qty 90, 90d supply, fill #0

## 2023-12-17 MED ORDER — LISINOPRIL 20 MG PO TABS
20.0000 mg | ORAL_TABLET | Freq: Every day | ORAL | 3 refills | Status: DC
  Filled 2023-12-25 – 2024-01-17 (×2): qty 90, 90d supply, fill #0

## 2023-12-17 MED ORDER — DULOXETINE HCL 60 MG PO CPEP
120.0000 mg | ORAL_CAPSULE | Freq: Every day | ORAL | 0 refills | Status: AC
Start: 2023-06-07 — End: ?

## 2023-12-17 MED ORDER — MIRTAZAPINE 15 MG PO TABS
15.0000 mg | ORAL_TABLET | Freq: Every day | ORAL | 3 refills | Status: AC
Start: 2023-11-06 — End: ?
  Filled 2023-12-25 – 2024-01-17 (×2): qty 90, 90d supply, fill #0

## 2023-12-17 MED ORDER — POTASSIUM CHLORIDE CRYS ER 20 MEQ PO TBCR
20.0000 meq | EXTENDED_RELEASE_TABLET | Freq: Every day | ORAL | 3 refills | Status: AC
  Filled 2023-12-17 – 2024-01-17 (×2): qty 90, 90d supply, fill #0
  Filled 2024-04-21: qty 90, 90d supply, fill #1

## 2023-12-17 MED ORDER — BUSPIRONE HCL 15 MG PO TABS
15.0000 mg | ORAL_TABLET | Freq: Three times a day (TID) | ORAL | 3 refills | Status: AC
  Filled 2023-12-28 – 2024-01-17 (×2): qty 270, 90d supply, fill #0
  Filled 2024-03-14: qty 90, 30d supply, fill #1

## 2023-12-17 MED ORDER — DESVENLAFAXINE SUCCINATE ER 50 MG PO TB24
50.0000 mg | ORAL_TABLET | Freq: Every day | ORAL | 3 refills | Status: DC
  Filled 2023-12-25 – 2024-01-17 (×2): qty 90, 90d supply, fill #0

## 2023-12-17 NOTE — Telephone Encounter (Signed)
 A document form from [facility] has been faxed: Home Health Certificate (Order ID [id number]), to be filled out by provider. Send document back via Fax within 7-days. Document is located in providers tray at front office.          Fax number: [phone]

## 2023-12-17 NOTE — Telephone Encounter (Signed)
 Call to patient . To  offer earlier appointment. Unable to reach. Patient does have an appointment scheduled for 12/19/2023 with provider already.

## 2023-12-18 ENCOUNTER — Other Ambulatory Visit (HOSPITAL_COMMUNITY): Payer: Self-pay

## 2023-12-18 MED ORDER — TRAZODONE HCL 50 MG PO TABS: 50.0000 mg | ORAL_TABLET | Freq: Every day | ORAL | 3 refills | Status: DC

## 2023-12-18 MED ORDER — DOXEPIN HCL 50 MG PO CAPS
50.0000 mg | ORAL_CAPSULE | Freq: Every day | ORAL | 1 refills | Status: DC
  Filled 2023-12-28: qty 30, 30d supply, fill #0

## 2023-12-18 MED ORDER — DULOXETINE HCL 60 MG PO CPEP
120.0000 mg | ORAL_CAPSULE | Freq: Every day | ORAL | 3 refills | Status: DC
  Filled 2023-12-25 – 2024-01-17 (×2): qty 180, 90d supply, fill #0

## 2023-12-18 MED ORDER — GABAPENTIN 300 MG PO CAPS
300.0000 mg | ORAL_CAPSULE | Freq: Every day | ORAL | 3 refills | Status: AC
  Filled 2023-12-25: qty 90, 90d supply, fill #0

## 2023-12-18 MED ORDER — FLUTICASONE-SALMETEROL 500-50 MCG/ACT IN AEPB
1.0000 | INHALATION_SPRAY | Freq: Two times a day (BID) | RESPIRATORY_TRACT | 3 refills | Status: AC
  Filled 2023-12-25 – 2024-04-21 (×2): qty 180, 90d supply, fill #0

## 2023-12-18 MED ORDER — DOXEPIN HCL 25 MG PO CAPS
25.0000 mg | ORAL_CAPSULE | Freq: Every day | ORAL | 3 refills | Status: DC
  Filled 2023-12-25: qty 90, 90d supply, fill #0

## 2023-12-18 MED ORDER — MIRTAZAPINE 15 MG PO TABS: 15.0000 mg | ORAL_TABLET | Freq: Every day | ORAL | 1 refills | Status: DC

## 2023-12-18 MED ORDER — ALBUTEROL SULFATE HFA 108 (90 BASE) MCG/ACT IN AERS
2.0000 | INHALATION_SPRAY | Freq: Four times a day (QID) | RESPIRATORY_TRACT | 3 refills | Status: AC | PRN
  Filled 2024-04-21: qty 20.1, 75d supply, fill #0

## 2023-12-18 MED ORDER — PREDNISONE 10 MG PO TABS: 20.0000 mg | ORAL_TABLET | Freq: Every day | ORAL | 0 refills | Status: DC

## 2023-12-18 MED ORDER — FUROSEMIDE 40 MG PO TABS
40.0000 mg | ORAL_TABLET | Freq: Every day | ORAL | 3 refills | Status: AC
  Filled 2023-12-25 – 2024-01-17 (×2): qty 90, 90d supply, fill #0
  Filled 2024-04-21: qty 90, 90d supply, fill #1

## 2023-12-18 MED ORDER — MONTELUKAST SODIUM 10 MG PO TABS
10.0000 mg | ORAL_TABLET | Freq: Every day | ORAL | 3 refills | Status: AC
  Filled 2023-12-25 – 2024-01-17 (×2): qty 90, 90d supply, fill #0
  Filled 2024-04-21: qty 90, 90d supply, fill #1

## 2023-12-18 MED ORDER — DULOXETINE HCL 60 MG PO CPEP
120.0000 mg | ORAL_CAPSULE | Freq: Every day | ORAL | 1 refills | Status: DC
  Filled 2023-12-25: qty 60, 30d supply, fill #0

## 2023-12-18 MED ORDER — PANTOPRAZOLE SODIUM 40 MG PO TBEC
40.0000 mg | DELAYED_RELEASE_TABLET | Freq: Every day | ORAL | 3 refills | Status: AC
  Filled 2023-12-25: qty 90, 90d supply, fill #0

## 2023-12-18 MED ORDER — MIRTAZAPINE 15 MG PO TABS
15.0000 mg | ORAL_TABLET | Freq: Every day | ORAL | 0 refills | Status: DC
  Filled 2024-03-14: qty 30, 30d supply, fill #0

## 2023-12-18 MED ORDER — POTASSIUM CHLORIDE ER 10 MEQ PO TBCR: 10.0000 meq | EXTENDED_RELEASE_TABLET | Freq: Every day | ORAL | 0 refills | Status: DC

## 2023-12-18 NOTE — Telephone Encounter (Signed)
 Call to patient. Unable  to reach. VM left. Therapist has been trying to contact her

## 2023-12-19 ENCOUNTER — Encounter: Admitting: Family

## 2023-12-19 ENCOUNTER — Other Ambulatory Visit (HOSPITAL_COMMUNITY): Payer: Self-pay

## 2023-12-19 NOTE — Progress Notes (Signed)
 Erroneous encounter-disregard

## 2023-12-21 ENCOUNTER — Ambulatory Visit: Payer: Self-pay

## 2023-12-21 NOTE — Telephone Encounter (Signed)
 FYI Only or Action Required?: FYI only for provider.  Patient was last seen in primary care on 06/25/2023 by Lorren Greig PARAS, NP.  Called Nurse Triage reporting Dysuria.  Symptoms began several months ago.  Interventions attempted: Prescription medications: previously tx for kidney infection per pt.  Symptoms are: gradually worsening.  Triage Disposition: See HCP Within 4 Hours (Or PCP Triage)  Patient/caregiver understands and will follow disposition?: No, wishes to speak with PCP Reason for Disposition  Side (flank) or lower back pain present  Fever > 100.4 F (38.0 C)  Answer Assessment - Initial Assessment Questions Dx with kidney infection in July, states continues to have dysuria consisting of burning, flank pain and increased frequency. States urine is medium-dark, denies obvious hematuria.   States symptoms decreased, but never resolved.   Patient requesting antibiotic be called in for her. This RN advised patient that she requires an appointment and it is unfortunately after hours on Friday. Recommended patient be seen at an UC today and reviewed rationales including the possibility of an infection requiring prompt treatment.  Patient states she is wheelchair bound and cannot secure transportation to an UC. This RN advised that she may also go to the ED via EMS if she does not have transportation.   Patient stated ok and then I'll just take care of it on Monday. Patient did not clarify if she will go to the ED.   1. SEVERITY: How bad is the pain?  (e.g., Scale 1-10; mild, moderate, or severe)     7-8/10  2. PATTERN: Is pain present every time you urinate or just sometimes?      Intermittent, worse at night  3. ONSET: When did the painful urination start?      Chronic since July  4. FEVER: Do you have a fever? If Yes, ask: What is your temperature, how was it measured, and when did it start?     100.8 intermittently  5. PAST UTI: Have you had a urine  infection before? If Yes, ask: When was the last time? and What happened that time?      Yes, chronic since July per patient  Protocols used: Urination Pain - [facility] Copied from CRM [id number]. Topic: Clinical - Red Word Triage >> Dec 21, 2023  5:10 PM Harlene ORN wrote: Red Word that prompted transfer to Nurse Triage: showing early signs of Kidney infection

## 2023-12-21 NOTE — Telephone Encounter (Signed)
 I will give PCP the paperwork to sign when she return on 12/24/2023

## 2023-12-21 NOTE — Telephone Encounter (Unsigned)
 Copied from CRM #[id number]. Topic: Clinical - Medication Refill >> Dec 21, 2023  5:06 PM Harlene ORN wrote: Medication: oxyCODONE -acetaminophen  (PERCOCET) 5-325 MG tablet, and antibiotic for an kidney infection  Has the patient contacted their pharmacy? Yes (Agent: If no, request that the patient contact the pharmacy for the refill. If patient does not wish to contact the pharmacy document the reason why and proceed with request.) (Agent: If yes, when and what did the pharmacy advise?)  This is the patient's preferred pharmacy:  [place] [facility] [address] [place] [address] Phone: [phone] Fax: [phone]  Is this the correct pharmacy for this prescription? Yes If no, delete pharmacy and type the correct one.   Has the prescription been filled recently? No  Is the patient out of the medication? Yes  Has the patient been seen for an appointment in the last year OR does the patient have an upcoming appointment? Yes  Can we respond through MyChart? Yes  Agent: Please be advised that Rx refills may take up to 3 business days. We ask that you follow-up with your pharmacy.

## 2023-12-24 ENCOUNTER — Telehealth: Payer: Self-pay | Admitting: Family

## 2023-12-24 NOTE — Telephone Encounter (Signed)
 I called patient with recommendations no one answered so I left a voicemail to return my call

## 2023-12-24 NOTE — Telephone Encounter (Signed)
 I gave to pcp on 12/24/2023 to sign

## 2023-12-24 NOTE — Telephone Encounter (Signed)
 Report to Emergency Department/Urgent Care/call 911 for immediate medical evaluation. Follow-up with Primary Care.

## 2023-12-24 NOTE — Telephone Encounter (Signed)
 I called patient and made her aware that her prescription was sent in no one answered so I left a voicemail for patient to call me back.

## 2023-12-24 NOTE — Telephone Encounter (Signed)
 Complete

## 2023-12-24 NOTE — Telephone Encounter (Signed)
 A document form from [facility] has been faxed: Home Health Certificate (Order ID [id number]), to be filled out by provider. Send document back via Fax within 7-days. Document is located in providers tray at front office.          Fax number: [phone]

## 2023-12-25 ENCOUNTER — Other Ambulatory Visit (HOSPITAL_COMMUNITY): Payer: Self-pay

## 2023-12-25 ENCOUNTER — Telehealth: Payer: Self-pay | Admitting: Family

## 2023-12-25 ENCOUNTER — Other Ambulatory Visit: Payer: Self-pay

## 2023-12-25 NOTE — Telephone Encounter (Signed)
 I called patient with recommendations no one answered and I was unable to leave a voicemail

## 2023-12-25 NOTE — Telephone Encounter (Signed)
 Pt called and stated pharmacy does not have prescription for Oxycodone  that she has been waiting for. Pt also requesting prescription for Tramadol  to be sent. Pt stated she sometimes takes tramadol  instead of Oxycodone . Pt is waiting or a CB with update today before lunch. Please advise.

## 2023-12-25 NOTE — Telephone Encounter (Signed)
 I called patient however no one answered and I was unable to leave a voicemail

## 2023-12-25 NOTE — Telephone Encounter (Signed)
 I called patient with PCP recommendations and no one answered.  The voicemail was full so I was unable to leave a message.

## 2023-12-25 NOTE — Telephone Encounter (Signed)
 Schedule appointment. Last office visit 06/25/2023. Tramadol  last prescribed 10/24/2022. Also, patient had Emergency Department visits since last office visit without follow-up. During the interim report to the Emergency Department/Urgent Care/call 911 for immediate medical evaluation.

## 2023-12-26 ENCOUNTER — Telehealth: Payer: Self-pay | Admitting: Family

## 2023-12-26 ENCOUNTER — Telehealth: Payer: Self-pay | Admitting: Emergency Medicine

## 2023-12-26 ENCOUNTER — Other Ambulatory Visit (HOSPITAL_COMMUNITY): Payer: Self-pay

## 2023-12-26 ENCOUNTER — Telehealth (INDEPENDENT_AMBULATORY_CARE_PROVIDER_SITE_OTHER): Admitting: Nurse Practitioner

## 2023-12-26 DIAGNOSIS — R109 Unspecified abdominal pain: Secondary | ICD-10-CM | POA: Insufficient documentation

## 2023-12-26 NOTE — Telephone Encounter (Unsigned)
 Copied from CRM #[id number]. Topic: Clinical - Medication Question >> Dec 26, 2023 10:49 AM Myrick T wrote: Reason for CRM: patient called upset that the provider has not called in her Oxycodone  and Tramadol . Patient said she also now need a script for amphetamine-dextroamphetamine (ADDERALL XR) 20 MG 24 hr capsule. Patient says she just left [facility] and will need a referral for an Orthopedic Specialist. Advised patient nurse will call her back but she was in with a patient.

## 2023-12-26 NOTE — Telephone Encounter (Signed)
 Olam and/or Natalie if you will please assist. Please review my advisement below. Thank you.   I am unable to prescribe Oxycodone  per [facility] office policy. Also, please refer to telephone call note listed below from 12/25/23 9:33 AM:  Schedule appointment. Last office visit 06/25/2023. Tramadol  last prescribed 10/24/2022. Also, patient had Emergency Department visits since last office visit without follow-up. During the interim report to the Emergency Department/Urgent Care/call 911 for immediate medical evaluation.

## 2023-12-26 NOTE — Telephone Encounter (Signed)
 A document form from [facility] has been faxed: Home Health Certificate (Order ID [id number]), to be filled out by provider. Send document back via Fax within 7-days. Document is located in providers tray at front office.          Fax number: [phone]

## 2023-12-26 NOTE — Progress Notes (Signed)
 No show

## 2023-12-27 ENCOUNTER — Telehealth: Payer: Self-pay | Admitting: Family

## 2023-12-27 ENCOUNTER — Other Ambulatory Visit (HOSPITAL_COMMUNITY): Payer: Self-pay

## 2023-12-27 NOTE — Telephone Encounter (Signed)
 In called patient no one answered and I was unable to leave a voicemail

## 2023-12-27 NOTE — Telephone Encounter (Signed)
 Duplicate paperwork pcp already has paperwork

## 2023-12-27 NOTE — Telephone Encounter (Signed)
 A document form from [facility] has been faxed: Home Health Certificate (Order ID [id number]), to be filled out by provider. Send document back via Fax within 7-days. Document is located in providers tray at front office.          Fax number: [phone]

## 2023-12-27 NOTE — Telephone Encounter (Signed)
 Duplicate paper work pcp already has the paperwork

## 2023-12-27 NOTE — Telephone Encounter (Signed)
 I called patient with pcp recommendations and was unable to leave a voicemail

## 2023-12-27 NOTE — Telephone Encounter (Signed)
 I called patient with PCP recommendations no one answered and I was unable to leave a voicemail

## 2023-12-28 ENCOUNTER — Other Ambulatory Visit (HOSPITAL_COMMUNITY): Payer: Self-pay

## 2023-12-29 ENCOUNTER — Other Ambulatory Visit (HOSPITAL_COMMUNITY): Payer: Self-pay

## 2023-12-31 ENCOUNTER — Other Ambulatory Visit: Payer: Self-pay | Admitting: Family

## 2023-12-31 ENCOUNTER — Telehealth: Payer: Self-pay | Admitting: Family

## 2023-12-31 ENCOUNTER — Other Ambulatory Visit: Payer: Self-pay

## 2023-12-31 DIAGNOSIS — J45909 Unspecified asthma, uncomplicated: Secondary | ICD-10-CM

## 2023-12-31 NOTE — Telephone Encounter (Signed)
 Complete

## 2023-12-31 NOTE — Telephone Encounter (Signed)
 Copied from CRM [id number]. Topic: Referral - Status >> Dec 12, 2023 12:09 PM Zebedee SAUNDERS wrote: Reason for CRM: Pt needs referral # [id number] sent to [facility] - [facility] [address] [facility] [address], [place], [place] [address] [phone]. >> Dec 31, 2023  3:39 PM Zebedee SAUNDERS wrote: Reason for CRM: Pt needs referral # [id number] sent to [facility] - C... 1 [facility] 3rd Floor, [place], [place] [address] [phone].

## 2023-12-31 NOTE — Telephone Encounter (Signed)
 Thank you :)

## 2024-01-03 NOTE — Telephone Encounter (Signed)
 I faxed paperwork back on 01/03/2024

## 2024-01-08 ENCOUNTER — Ambulatory Visit: Payer: Self-pay

## 2024-01-08 NOTE — Telephone Encounter (Signed)
 FYI Only or Action Required?: Action required by provider: Requesting a call from admin and earlier appointment if possible.  Patient was last seen in primary care on 12/26/2023 by Paseda, Folashade R, FNP.  Called Nurse Triage reporting Foot Pain/Swelling.  Symptoms began several months ago.  Interventions attempted: Nothing.  Symptoms are: gradually worsening.  Triage Disposition: See PCP Within 2 Weeks  Patient/caregiver understands and will follow disposition?: Yes    Copied from CRM [id number]. Topic: Clinical - Red Word Triage >> Jan 08, 2024  3:47 PM Fonda T wrote: Red Word that prompted transfer to Nurse Triage: Patient states she is having increased swelling in lower extremities, legs, ankles, etc. Reason for Disposition  Foot pain is a chronic symptom (recurrent or ongoing AND present > 4 weeks)  Answer Assessment - Initial Assessment Questions Patient states the weakness is in R leg up to her hip and is chronic. Patient states the pain is chronic in nature. Patient states she would like oxycotin or tramdol for symptoms and is upset that she does not receive it. Patient requesting for admin to call her regarding concern. Patient also requesting an earlier appointment if possible. Patient states she could do appointments on Monday afternoons due to transportation issues and 1st available appointment made for patient at time requested.    1. ONSET: When did the pain start?      Ongoing for months  2. LOCATION: Where is the pain located?      Bilateral feet and legs  3. PAIN: How bad is the pain?    (Scale 1-10; or mild, moderate, severe)     Moderate  4. CAUSE: What do you think is causing the foot pain?     Neuropathy  5. OTHER SYMPTOMS: Do you have any other symptoms? (e.g., leg pain, rash, fever, numbness)     Swelling is moderate to severe  Protocols used: Foot Pain-A-AH

## 2024-01-08 NOTE — Telephone Encounter (Signed)
 Patient request call from admin regarding her mediations

## 2024-01-09 ENCOUNTER — Telehealth: Payer: Self-pay | Admitting: Family

## 2024-01-09 NOTE — Telephone Encounter (Signed)
 Type Date User Summary Attachment  Specialty Comments 12/30/2023  7:24 PM [name] [name] -  Note: Rejection Reason - Patient did not respond - no response to schedule after multiple attempts, referral closed Woodward Ko said on Nov 30, 2023 3:02 PM   Sent referral to [facility]  Ph. # [phone] Address [address] [place] [address] They will contact the patient to schedule an appointment.   Informed patient of this message. Appreciative and will reach out to office for an appt.

## 2024-01-09 NOTE — Telephone Encounter (Signed)
 Copied from CRM [id number]. Topic: Referral - Request for Referral >> Jan 08, 2024  3:44 PM Fonda T wrote:  Did the patient discuss referral with their provider in the last year? Yes  Patein states referral was sent to incorrect office, states referral should have been sent to [facility].  She wants this done as soon as possible, as she states she has been waiting for an appointment with specialist.  Appointment offered? No  Type of order/referral and detailed reason for visit: Pulmonologist  Preference of office, provider, location: [address]  If referral order, have you been seen by this specialty before? No (If Yes, this issue or another issue? When? Where?  Can we respond through MyChart? No, prefers a follow up phone call at [phone] >> Jan 08, 2024  3:50 PM Fonda T wrote: Phone number to office patient is requesting referral for Pulmonology is [phone].  [facility]  [phone]

## 2024-01-09 NOTE — Telephone Encounter (Unsigned)
 Copied from CRM [id number]. Topic: Referral - Question >> Jan 09, 2024  4:47 PM Winona R wrote: Patient is calling to request a new referral be sent over to [facility] she was referred to in Aug. I don't see a referral listed phone number is [phone]

## 2024-01-09 NOTE — Telephone Encounter (Unsigned)
 Copied from CRM [id number]. Topic: Referral - Question >> Jan 09, 2024  3:18 PM Precious C wrote: Reason for CRM: Pt called in very frustrated about a referral she needs, pt is requesting a referral for her kidneys

## 2024-01-09 NOTE — Telephone Encounter (Signed)
 Copied from CRM #[id number]. Topic: Referral - Question >> Jan 09, 2024  3:34 PM Tonda B wrote: Reason for CRM: patient is calling saying that [facility] is calling asking for a new referral with 3 days instead of 2 days a week  please call patient if any [phone] (H)

## 2024-01-10 NOTE — Telephone Encounter (Signed)
 Message addressed on yesterday .  Information provided to patient at around 4 pm.

## 2024-01-17 ENCOUNTER — Other Ambulatory Visit (HOSPITAL_COMMUNITY): Payer: Self-pay

## 2024-01-17 ENCOUNTER — Other Ambulatory Visit: Payer: Self-pay | Admitting: Family

## 2024-01-17 DIAGNOSIS — R52 Pain, unspecified: Secondary | ICD-10-CM

## 2024-01-18 NOTE — Telephone Encounter (Signed)
 Schedule appointment. Last office visit 06/25/2023. Also, please note I am unable to prescribe Oxycodone  due to [facility] office policy.

## 2024-01-21 ENCOUNTER — Ambulatory Visit

## 2024-01-21 NOTE — Telephone Encounter (Signed)
 Pt scheduled

## 2024-01-21 NOTE — Telephone Encounter (Signed)
 Complete

## 2024-01-31 ENCOUNTER — Encounter (HOSPITAL_COMMUNITY): Payer: Self-pay

## 2024-01-31 ENCOUNTER — Other Ambulatory Visit (HOSPITAL_COMMUNITY): Payer: Self-pay

## 2024-02-04 ENCOUNTER — Encounter: Payer: Self-pay | Admitting: Radiology

## 2024-02-06 ENCOUNTER — Other Ambulatory Visit: Payer: Self-pay

## 2024-02-06 ENCOUNTER — Other Ambulatory Visit (HOSPITAL_COMMUNITY): Payer: Self-pay

## 2024-02-06 MED ORDER — AMPHETAMINE-DEXTROAMPHET ER 20 MG PO CP24
20.0000 mg | ORAL_CAPSULE | Freq: Every day | ORAL | 0 refills | Status: DC
  Filled 2024-02-06: qty 30, 30d supply, fill #0

## 2024-02-06 MED ORDER — DULOXETINE HCL 60 MG PO CPEP
120.0000 mg | ORAL_CAPSULE | Freq: Every day | ORAL | 1 refills | Status: DC
  Filled 2024-03-14: qty 60, 30d supply, fill #0

## 2024-02-06 MED ORDER — BUSPIRONE HCL 15 MG PO TABS: 15.0000 mg | ORAL_TABLET | Freq: Three times a day (TID) | ORAL | 1 refills | Status: DC

## 2024-02-06 MED ORDER — MIRTAZAPINE 15 MG PO TABS: 15.0000 mg | ORAL_TABLET | Freq: Every day | ORAL | 1 refills | Status: DC

## 2024-02-06 MED ORDER — DOXEPIN HCL 50 MG PO CAPS
50.0000 mg | ORAL_CAPSULE | Freq: Every day | ORAL | 1 refills | Status: AC
  Filled 2024-02-06: qty 30, 30d supply, fill #0
  Filled 2024-03-14: qty 30, 30d supply, fill #1

## 2024-02-11 ENCOUNTER — Ambulatory Visit: Payer: Self-pay

## 2024-02-11 ENCOUNTER — Other Ambulatory Visit (HOSPITAL_COMMUNITY): Payer: Self-pay

## 2024-02-11 ENCOUNTER — Ambulatory Visit

## 2024-02-11 MED ORDER — AMPHETAMINE-DEXTROAMPHET ER 20 MG PO CP24: 20.0000 mg | ORAL_CAPSULE | Freq: Every day | ORAL | 0 refills | Status: DC

## 2024-02-11 MED ORDER — AMPHETAMINE-DEXTROAMPHET ER 20 MG PO CP24: 20.0000 mg | ORAL_CAPSULE | Freq: Every day | ORAL | 0 refills | Status: AC

## 2024-02-11 NOTE — Telephone Encounter (Signed)
 Patient scheduled with PCP at 4p today.

## 2024-02-11 NOTE — Telephone Encounter (Signed)
 FYI Only or Action Required?: Action required by provider: appointment today, requesting appointment to be virtual .  Patient was last seen in primary care on 12/26/2023 by Paseda, Folashade R, FNP.  Called Nurse Triage reporting Back Pain.  Symptoms began Chronic, exacerbated several days ago .  Interventions attempted: Prescription medications: gabapentin , states this medication does not work.  Symptoms are: gradually worsening.  Triage Disposition: See Physician Within 24 Hours  Patient/caregiver understands and will follow disposition?: Yes                            Copied from CRM [id number]. Topic: Clinical - Red Word Triage >> Feb 11, 2024  8:53 AM [name] B wrote: Red Word that prompted transfer to Nurse Triage: Severe pain lower back into right hip and ankle for 4-5 days.  Unable to get out of bed Reason for Disposition  Numbness in a leg or foot (i.e., loss of sensation)  Answer Assessment - Initial Assessment Questions 1. ONSET: When did the pain begin? (e.g., minutes, hours, days)     Chronic pain, exacerbated over past few days 2. LOCATION: Where does it hurt? (upper, mid or lower back)     Back and right hip to ankle 3. SEVERITY: How bad is the pain?  (e.g., Scale 1-10; mild, moderate, or severe)     Rates pain 7-8 4. PATTERN: Is the pain constant? (e.g., yes, no; constant, intermittent)      Constant 5. RADIATION: Does the pain shoot into your legs or somewhere else?     Radiates to right hip and ankle  6. CAUSE:  What do you think is causing the back pain?      Chronic pain related to fibromyalgia  7. BACK OVERUSE:  Any recent lifting of heavy objects, strenuous work or exercise?     N/A 8. MEDICINES: What have you taken so far for the pain? (e.g., nothing, acetaminophen , NSAIDS)     Gabapentin   9. NEUROLOGIC SYMPTOMS: Do you have any weakness, numbness, or problems with bowel/bladder control?     Numbness present in right  ankle and foot for quite some time 10. OTHER SYMPTOMS: Do you have any other symptoms? (e.g., fever, abdomen pain, burning with urination, blood in urine)     RLQ abdominal cramping/pain- denies vomiting, states pain comes and goes, onsets after eating  11. PREGNANCY: Is there any chance you are pregnant? When was your last menstrual period?     N/A    Patient had an appointment previously scheduled with alternate provider in office today. Patient called today and demanded appointment to be changed to virtual, because pain is limiting mobility. This RN attempted to change appointment type to virtual, but was blocked from adjusting appointment. Please advise. Patient would like a call back from office confirming appointment can be done virtually.  Protocols used: Back Pain-A-AH

## 2024-02-11 NOTE — Telephone Encounter (Signed)
 This RN attempted to notify [facility], but no answer.

## 2024-02-12 ENCOUNTER — Other Ambulatory Visit (HOSPITAL_COMMUNITY): Payer: Self-pay

## 2024-02-13 ENCOUNTER — Ambulatory Visit

## 2024-02-20 ENCOUNTER — Ambulatory Visit

## 2024-02-22 ENCOUNTER — Telehealth: Payer: Self-pay | Admitting: Family

## 2024-02-22 NOTE — Telephone Encounter (Signed)
 Schedule appointment. Last office visit 06/25/2023. During the interim report to the Emergency Department/Urgent Care/call 911 for immediate medical evaluation. Follow-up with Primary Care.

## 2024-02-22 NOTE — Telephone Encounter (Signed)
 Pt called requesting refill for tramadol . Pt stated her brother passed away and needs her medication to get by. Pt stated she has missed her appts because she is in a wheelchair. I let pt know that she will need an appointment for medication refill because she has not been seen more than 3 months. Pt still wanted to ask if the tramadol  can be sent in due to her circumstances. Please advise.

## 2024-02-24 NOTE — Progress Notes (Deleted)
 New Patient Pulmonology Office Visit   Subjective:  Patient ID: Janice Ryan, female    DOB: Dec 05, 1955  MRN: [id number]  Referred by: Jaycee Greig PARAS, NP  CC: No chief complaint on file.   HPI Janice Ryan is a 68 y.o. female with ***  Obesity Asthma?   {PULM QUESTIONNAIRES (Optional):33196}  ROS  Allergies: Sulfa antibiotics  Current Outpatient Medications:    acetaminophen  (TYLENOL ) 500 MG tablet, Take 2 tablets (1,000 mg total) by mouth every 6 (six) hours as needed for mild pain (pain score 1-3)., Disp: 30 tablet, Rfl: 0   albuterol  (VENTOLIN  HFA) 108 (90 Base) MCG/ACT inhaler, Inhale 2 puffs into the lungs every 6 (six) hours as needed for wheezing or shortness of breath., Disp: 18 g, Rfl: 2   albuterol  (VENTOLIN  HFA) 108 (90 Base) MCG/ACT inhaler, Inhale 2 puffs into the lungs every 6 (six) hours as needed for wheezing/shortness of breath., Disp: 54 g, Rfl: 3   amphetamine -dextroamphetamine  (ADDERALL XR) 20 MG 24 hr capsule, Take 20 mg by mouth daily., Disp: , Rfl:    amphetamine -dextroamphetamine  (ADDERALL XR) 20 MG 24 hr capsule, Take 1 capsule (20 mg total) by mouth daily., Disp: 30 capsule, Rfl: 0   [START ON 04/05/2024] amphetamine -dextroamphetamine  (ADDERALL XR) 20 MG 24 hr capsule, Take 1 capsule (20 mg total) by mouth daily. 04/05/24, Disp: 30 capsule, Rfl: 0   [START ON 03/05/2024] amphetamine -dextroamphetamine  (ADDERALL XR) 20 MG 24 hr capsule, Take 1 capsule (20 mg total) by mouth daily. 03/05/24, Disp: 30 capsule, Rfl: 0   busPIRone  (BUSPAR ) 15 MG tablet, Take 15 mg by mouth 3 (three) times daily., Disp: , Rfl:    busPIRone  (BUSPAR ) 15 MG tablet, Take 1 tablet (15 mg total) by mouth 3 (three) times daily., Disp: 270 tablet, Rfl: 3   busPIRone  (BUSPAR ) 15 MG tablet, Take 1 tablet (15 mg total) by mouth 3 (three) times daily., Disp: 90 tablet, Rfl: 1   cetirizine  (ZYRTEC  ALLERGY) 10 MG tablet, Take 1 tablet (10 mg total) by mouth daily., Disp: 30  tablet, Rfl: 2   cholecalciferol  (VITAMIN D3) 25 MCG (1000 UNIT) tablet, Take 1,000 Units by mouth daily., Disp: , Rfl:    desvenlafaxine  (PRISTIQ ) 50 MG 24 hr tablet, Take 1 tablet (50 mg total) by mouth daily., Disp: 90 tablet, Rfl: 3   diclofenac  (VOLTAREN ) 50 MG EC tablet, Take 1 tablet (50 mg total) by mouth daily., Disp: 90 tablet, Rfl: 3   diclofenac  (VOLTAREN ) 75 MG EC tablet, Take 1 tablet (75 mg total) by mouth 2 (two) times daily., Disp: 20 tablet, Rfl: 0   doxepin  (SINEQUAN ) 25 MG capsule, Take 1 capsule (25 mg total) by mouth daily., Disp: 90 capsule, Rfl: 3   doxepin  (SINEQUAN ) 50 MG capsule, Take 1 capsule (50 mg total) by mouth in the morning., Disp: 10 capsule, Rfl: 0   doxepin  (SINEQUAN ) 50 MG capsule, Take 1 capsule (50 mg total) by mouth at bedtime., Disp: 30 capsule, Rfl: 1   DULoxetine  (CYMBALTA ) 60 MG capsule, Take 2 capsules (120 mg total) by mouth daily., Disp: 30 capsule, Rfl: 3   DULoxetine  (CYMBALTA ) 60 MG capsule, Take 2 capsules (120 mg total) by mouth daily., Disp: 180 capsule, Rfl: 0   DULoxetine  (CYMBALTA ) 60 MG capsule, Take 2 capsules (120 mg total) by mouth daily., Disp: 180 capsule, Rfl: 3   DULoxetine  (CYMBALTA ) 60 MG capsule, Take 2 capsules (120 mg total) by mouth daily., Disp: 60 capsule, Rfl: 1   DULoxetine  (  CYMBALTA ) 60 MG capsule, Take 2 capsules (120 mg total) by mouth daily., Disp: 60 capsule, Rfl: 1   fluticasone  (FLONASE ) 50 MCG/ACT nasal spray, Place 2 sprays into both nostrils daily., Disp: , Rfl: 2   fluticasone -salmeterol (ADVAIR ) 500-50 MCG/ACT AEPB, Inhale 1 puff into the lungs in the morning and at bedtime., Disp: 60 each, Rfl: 2   fluticasone -salmeterol (WIXELA INHUB ) 500-50 MCG/ACT AEPB, Inhale 1 puff into the lungs 2 (two) times daily., Disp: 180 each, Rfl: 3   furosemide  (LASIX ) 40 MG tablet, Take 1 tablet (40 mg total) by mouth daily., Disp: 30 tablet, Rfl: 0   furosemide  (LASIX ) 40 MG tablet, Take 1 tablet (40 mg total) by mouth daily.,  Disp: 90 tablet, Rfl: 3   gabapentin  (NEURONTIN ) 300 MG capsule, Take 1 capsule (300 mg total) by mouth at bedtime., Disp: 90 capsule, Rfl: 0   gabapentin  (NEURONTIN ) 300 MG capsule, Take 1 capsule (300 mg total) by mouth at bedtime., Disp: 90 capsule, Rfl: 3   lisinopril  (ZESTRIL ) 20 MG tablet, Take 1 tablet (20 mg total) by mouth daily., Disp: 90 tablet, Rfl: 3   mirtazapine  (REMERON ) 15 MG tablet, Take 1 tablet (15 mg total) by mouth at bedtime., Disp: 10 tablet, Rfl: 0   mirtazapine  (REMERON ) 15 MG tablet, Take 1 tablet (15 mg total) by mouth at bedtime., Disp: 90 tablet, Rfl: 3   mirtazapine  (REMERON ) 15 MG tablet, Take 1 tablet (15 mg total) by mouth at bedtime., Disp: 30 tablet, Rfl: 1   mirtazapine  (REMERON ) 15 MG tablet, Take 1 tablet (15 mg total) by mouth at bedtime., Disp: 30 tablet, Rfl: 0   mirtazapine  (REMERON ) 15 MG tablet, Take 1 tablet (15 mg total) by mouth at bedtime., Disp: 30 tablet, Rfl: 1   montelukast  (SINGULAIR ) 10 MG tablet, Take 1 tablet (10 mg total) by mouth at bedtime. (Patient not taking: Reported on 09/28/2023), Disp: 90 tablet, Rfl: 0   montelukast  (SINGULAIR ) 10 MG tablet, Take 1 tablet (10 mg total) by mouth at bedtime., Disp: 90 tablet, Rfl: 3   Multiple Vitamin (MULTIVITAMIN WITH MINERALS) TABS tablet, Take 1 tablet by mouth daily., Disp: , Rfl:    Omega-3 Fatty Acids (OMEGA-3 PO), Take 1 capsule by mouth daily., Disp: , Rfl:    oxyCODONE  (OXY IR/ROXICODONE ) 5 MG immediate release tablet, Take 1 tablet (5 mg total) by mouth every 6 (six) hours as needed for severe pain (pain score 7-10)., Disp: 10 tablet, Rfl: 0   oxyCODONE -acetaminophen  (PERCOCET) 5-325 MG tablet, Take 1 every 6 hours for pain not relieved by Tylenol  or Motrin  along, Disp: 20 tablet, Rfl: 0   OXYGEN, Inhale 5 L into the lungs continuous., Disp: , Rfl:    pantoprazole  (PROTONIX ) 40 MG tablet, Take 1 tablet (40 mg total) by mouth daily., Disp: 30 tablet, Rfl: 0   pantoprazole  (PROTONIX ) 40 MG  tablet, Take 1 tablet (40 mg total) by mouth daily., Disp: 90 tablet, Rfl: 3   potassium chloride  (KLOR-CON  M) 10 MEQ tablet, Take 1 tablet (10 mEq total) by mouth daily., Disp: 30 tablet, Rfl: 0   potassium chloride  (KLOR-CON ) 10 MEQ tablet, Take 1 tablet (10 mEq total) by mouth daily., Disp: 30 tablet, Rfl: 0   potassium chloride  SA (KLOR-CON  M) 20 MEQ tablet, Take 1 tablet (20 mEq total) by mouth daily., Disp: 90 tablet, Rfl: 3   predniSONE  (DELTASONE ) 10 MG tablet, Take 2 tablets (20 mg total) by mouth daily., Disp: 15 tablet, Rfl: 0   traZODone  (DESYREL )  50 MG tablet, Take 50 mg by mouth at bedtime as needed for sleep., Disp: , Rfl:    traZODone  (DESYREL ) 50 MG tablet, Take 1 tablet (50 mg total) by mouth at bedtime., Disp: 90 tablet, Rfl: 3 Past Medical History:  Diagnosis Date   Acute respiratory failure with hypoxia (HCC)    Asthma    Chronic hypercapnic respiratory failure (HCC)    Hypertension    Morbid obesity (HCC)    Obesity with alveolar hypoventilation and body mass index (BMI) of 40 or greater (HCC)    OSA (obstructive sleep apnea)    Past Surgical History:  Procedure Laterality Date   HERNIA REPAIR     JOINT REPLACEMENT Left    knee   Family History  Problem Relation Age of Onset   Atrial fibrillation Mother    Congestive Heart Failure Father    Social History   Socioeconomic History   Marital status: Married    Spouse name: Not on file   Number of children: Not on file   Years of education: Not on file   Highest education level: Not on file  Occupational History   Not on file  Tobacco Use   Smoking status: Never   Smokeless tobacco: Never  Vaping Use   Vaping status: Never Used  Substance and Sexual Activity   Alcohol  use: Never   Drug use: Never   Sexual activity: Not Currently  Other Topics Concern   Not on file  Social History Narrative   Not on file   Social Drivers of Health   Financial Resource Strain: Not on file  Food Insecurity: No  Food Insecurity (09/27/2023)   Hunger Vital Sign    Worried About Running Out of Food in the Last Year: Never true    Ran Out of Food in the Last Year: Never true  Transportation Needs: No Transportation Needs (09/27/2023)   PRAPARE - Administrator, Civil Service (Medical): No    Lack of Transportation (Non-Medical): No  Physical Activity: Not on file  Stress: Not on file  Social Connections: Socially Integrated (09/27/2023)   Social Connection and Isolation Panel    Frequency of Communication with Friends and Family: More than three times a week    Frequency of Social Gatherings with Friends and Family: Once a week    Attends Religious Services: More than 4 times per year    Active Member of [employer] or Organizations: Yes    Attends Banker Meetings: More than 4 times per year    Marital Status: Married  Catering Manager Violence: Not At Risk (09/27/2023)   Humiliation, Afraid, Rape, and Kick questionnaire    Fear of Current or Ex-Partner: No    Emotionally Abused: No    Physically Abused: No    Sexually Abused: No       Objective:  There were no vitals taken for this visit. {Pulm Vitals (Optional):32837}  Physical Exam  Diagnostic Review:  {Labs (Optional):32838}     Assessment & Plan:   Assessment & Plan    No follow-ups on file.    Marny Patch, MD Pulmonary and Critical Care Medicine [facility]

## 2024-02-25 ENCOUNTER — Other Ambulatory Visit: Payer: Self-pay | Admitting: Family

## 2024-02-25 ENCOUNTER — Other Ambulatory Visit: Payer: Self-pay

## 2024-02-25 ENCOUNTER — Ambulatory Visit

## 2024-02-25 ENCOUNTER — Other Ambulatory Visit (HOSPITAL_COMMUNITY): Payer: Self-pay

## 2024-02-25 DIAGNOSIS — R52 Pain, unspecified: Secondary | ICD-10-CM

## 2024-02-25 NOTE — Telephone Encounter (Signed)
 Schedule appointment. Last office visit 06/25/2023. During the interim report to the Emergency Department/Urgent Care/call 911 for immediate medical evaluation.

## 2024-02-25 NOTE — Telephone Encounter (Signed)
 I called patient and phone went straight to voicemail.  Voicemail was full so I was unable to leave a message.

## 2024-02-25 NOTE — Telephone Encounter (Signed)
 Pt scheduled

## 2024-02-27 ENCOUNTER — Ambulatory Visit

## 2024-03-02 NOTE — Progress Notes (Incomplete)
 New Patient Pulmonology Office Visit   Subjective:  Patient ID: Janice Ryan, female    DOB: 1955/09/19  MRN: [id number]  Referred by: Jaycee Greig PARAS, NP  CC: No chief complaint on file.   HPI Janice Ryan is a 68 y.o. female with ***  Obesity Asthma?   {PULM QUESTIONNAIRES (Optional):33196}  ROS  Allergies: Sulfa antibiotics  Current Outpatient Medications:    acetaminophen  (TYLENOL ) 500 MG tablet, Take 2 tablets (1,000 mg total) by mouth every 6 (six) hours as needed for mild pain (pain score 1-3)., Disp: 30 tablet, Rfl: 0   albuterol  (VENTOLIN  HFA) 108 (90 Base) MCG/ACT inhaler, Inhale 2 puffs into the lungs every 6 (six) hours as needed for wheezing or shortness of breath., Disp: 18 g, Rfl: 2   albuterol  (VENTOLIN  HFA) 108 (90 Base) MCG/ACT inhaler, Inhale 2 puffs into the lungs every 6 (six) hours as needed for wheezing/shortness of breath., Disp: 54 g, Rfl: 3   amphetamine -dextroamphetamine  (ADDERALL XR) 20 MG 24 hr capsule, Take 20 mg by mouth daily., Disp: , Rfl:    amphetamine -dextroamphetamine  (ADDERALL XR) 20 MG 24 hr capsule, Take 1 capsule (20 mg total) by mouth daily., Disp: 30 capsule, Rfl: 0   [START ON 04/05/2024] amphetamine -dextroamphetamine  (ADDERALL XR) 20 MG 24 hr capsule, Take 1 capsule (20 mg total) by mouth daily. 04/05/24, Disp: 30 capsule, Rfl: 0   [START ON 03/05/2024] amphetamine -dextroamphetamine  (ADDERALL XR) 20 MG 24 hr capsule, Take 1 capsule (20 mg total) by mouth daily. 03/05/24, Disp: 30 capsule, Rfl: 0   busPIRone  (BUSPAR ) 15 MG tablet, Take 15 mg by mouth 3 (three) times daily., Disp: , Rfl:    busPIRone  (BUSPAR ) 15 MG tablet, Take 1 tablet (15 mg total) by mouth 3 (three) times daily., Disp: 270 tablet, Rfl: 3   busPIRone  (BUSPAR ) 15 MG tablet, Take 1 tablet (15 mg total) by mouth 3 (three) times daily., Disp: 90 tablet, Rfl: 1   cetirizine  (ZYRTEC  ALLERGY) 10 MG tablet, Take 1 tablet (10 mg total) by mouth daily., Disp: 30  tablet, Rfl: 2   cholecalciferol  (VITAMIN D3) 25 MCG (1000 UNIT) tablet, Take 1,000 Units by mouth daily., Disp: , Rfl:    desvenlafaxine  (PRISTIQ ) 50 MG 24 hr tablet, Take 1 tablet (50 mg total) by mouth daily., Disp: 90 tablet, Rfl: 3   diclofenac  (VOLTAREN ) 50 MG EC tablet, Take 1 tablet (50 mg total) by mouth daily., Disp: 90 tablet, Rfl: 3   diclofenac  (VOLTAREN ) 75 MG EC tablet, Take 1 tablet (75 mg total) by mouth 2 (two) times daily., Disp: 20 tablet, Rfl: 0   doxepin  (SINEQUAN ) 25 MG capsule, Take 1 capsule (25 mg total) by mouth daily., Disp: 90 capsule, Rfl: 3   doxepin  (SINEQUAN ) 50 MG capsule, Take 1 capsule (50 mg total) by mouth in the morning., Disp: 10 capsule, Rfl: 0   doxepin  (SINEQUAN ) 50 MG capsule, Take 1 capsule (50 mg total) by mouth at bedtime., Disp: 30 capsule, Rfl: 1   DULoxetine  (CYMBALTA ) 60 MG capsule, Take 2 capsules (120 mg total) by mouth daily., Disp: 30 capsule, Rfl: 3   DULoxetine  (CYMBALTA ) 60 MG capsule, Take 2 capsules (120 mg total) by mouth daily., Disp: 180 capsule, Rfl: 0   DULoxetine  (CYMBALTA ) 60 MG capsule, Take 2 capsules (120 mg total) by mouth daily., Disp: 180 capsule, Rfl: 3   DULoxetine  (CYMBALTA ) 60 MG capsule, Take 2 capsules (120 mg total) by mouth daily., Disp: 60 capsule, Rfl: 1   DULoxetine  (  CYMBALTA ) 60 MG capsule, Take 2 capsules (120 mg total) by mouth daily., Disp: 60 capsule, Rfl: 1   fluticasone  (FLONASE ) 50 MCG/ACT nasal spray, Place 2 sprays into both nostrils daily., Disp: , Rfl: 2   fluticasone -salmeterol (ADVAIR ) 500-50 MCG/ACT AEPB, Inhale 1 puff into the lungs in the morning and at bedtime., Disp: 60 each, Rfl: 2   fluticasone -salmeterol (WIXELA INHUB ) 500-50 MCG/ACT AEPB, Inhale 1 puff into the lungs 2 (two) times daily., Disp: 180 each, Rfl: 3   furosemide  (LASIX ) 40 MG tablet, Take 1 tablet (40 mg total) by mouth daily., Disp: 30 tablet, Rfl: 0   furosemide  (LASIX ) 40 MG tablet, Take 1 tablet (40 mg total) by mouth daily.,  Disp: 90 tablet, Rfl: 3   gabapentin  (NEURONTIN ) 300 MG capsule, Take 1 capsule (300 mg total) by mouth at bedtime., Disp: 90 capsule, Rfl: 0   gabapentin  (NEURONTIN ) 300 MG capsule, Take 1 capsule (300 mg total) by mouth at bedtime., Disp: 90 capsule, Rfl: 3   lisinopril  (ZESTRIL ) 20 MG tablet, Take 1 tablet (20 mg total) by mouth daily., Disp: 90 tablet, Rfl: 3   mirtazapine  (REMERON ) 15 MG tablet, Take 1 tablet (15 mg total) by mouth at bedtime., Disp: 10 tablet, Rfl: 0   mirtazapine  (REMERON ) 15 MG tablet, Take 1 tablet (15 mg total) by mouth at bedtime., Disp: 90 tablet, Rfl: 3   mirtazapine  (REMERON ) 15 MG tablet, Take 1 tablet (15 mg total) by mouth at bedtime., Disp: 30 tablet, Rfl: 1   mirtazapine  (REMERON ) 15 MG tablet, Take 1 tablet (15 mg total) by mouth at bedtime., Disp: 30 tablet, Rfl: 0   mirtazapine  (REMERON ) 15 MG tablet, Take 1 tablet (15 mg total) by mouth at bedtime., Disp: 30 tablet, Rfl: 1   montelukast  (SINGULAIR ) 10 MG tablet, Take 1 tablet (10 mg total) by mouth at bedtime. (Patient not taking: Reported on 09/28/2023), Disp: 90 tablet, Rfl: 0   montelukast  (SINGULAIR ) 10 MG tablet, Take 1 tablet (10 mg total) by mouth at bedtime., Disp: 90 tablet, Rfl: 3   Multiple Vitamin (MULTIVITAMIN WITH MINERALS) TABS tablet, Take 1 tablet by mouth daily., Disp: , Rfl:    Omega-3 Fatty Acids (OMEGA-3 PO), Take 1 capsule by mouth daily., Disp: , Rfl:    oxyCODONE  (OXY IR/ROXICODONE ) 5 MG immediate release tablet, Take 1 tablet (5 mg total) by mouth every 6 (six) hours as needed for severe pain (pain score 7-10)., Disp: 10 tablet, Rfl: 0   oxyCODONE -acetaminophen  (PERCOCET) 5-325 MG tablet, Take 1 every 6 hours for pain not relieved by Tylenol  or Motrin  along, Disp: 20 tablet, Rfl: 0   OXYGEN, Inhale 5 L into the lungs continuous., Disp: , Rfl:    pantoprazole  (PROTONIX ) 40 MG tablet, Take 1 tablet (40 mg total) by mouth daily., Disp: 30 tablet, Rfl: 0   pantoprazole  (PROTONIX ) 40 MG  tablet, Take 1 tablet (40 mg total) by mouth daily., Disp: 90 tablet, Rfl: 3   potassium chloride  (KLOR-CON  M) 10 MEQ tablet, Take 1 tablet (10 mEq total) by mouth daily., Disp: 30 tablet, Rfl: 0   potassium chloride  (KLOR-CON ) 10 MEQ tablet, Take 1 tablet (10 mEq total) by mouth daily., Disp: 30 tablet, Rfl: 0   potassium chloride  SA (KLOR-CON  M) 20 MEQ tablet, Take 1 tablet (20 mEq total) by mouth daily., Disp: 90 tablet, Rfl: 3   predniSONE  (DELTASONE ) 10 MG tablet, Take 2 tablets (20 mg total) by mouth daily., Disp: 15 tablet, Rfl: 0   traZODone  (DESYREL )  50 MG tablet, Take 50 mg by mouth at bedtime as needed for sleep., Disp: , Rfl:    traZODone  (DESYREL ) 50 MG tablet, Take 1 tablet (50 mg total) by mouth at bedtime., Disp: 90 tablet, Rfl: 3 Past Medical History:  Diagnosis Date   Acute respiratory failure with hypoxia (HCC)    Asthma    Chronic hypercapnic respiratory failure (HCC)    Hypertension    Morbid obesity (HCC)    Obesity with alveolar hypoventilation and body mass index (BMI) of 40 or greater (HCC)    OSA (obstructive sleep apnea)    Past Surgical History:  Procedure Laterality Date   HERNIA REPAIR     JOINT REPLACEMENT Left    knee   Family History  Problem Relation Age of Onset   Atrial fibrillation Mother    Congestive Heart Failure Father    Social History   Socioeconomic History   Marital status: Married    Spouse name: Not on file   Number of children: Not on file   Years of education: Not on file   Highest education level: Not on file  Occupational History   Not on file  Tobacco Use   Smoking status: Never   Smokeless tobacco: Never  Vaping Use   Vaping status: Never Used  Substance and Sexual Activity   Alcohol  use: Never   Drug use: Never   Sexual activity: Not Currently  Other Topics Concern   Not on file  Social History Narrative   Not on file   Social Drivers of Health   Financial Resource Strain: Not on file  Food Insecurity: No  Food Insecurity (09/27/2023)   Hunger Vital Sign    Worried About Running Out of Food in the Last Year: Never true    Ran Out of Food in the Last Year: Never true  Transportation Needs: No Transportation Needs (09/27/2023)   PRAPARE - Administrator, Civil Service (Medical): No    Lack of Transportation (Non-Medical): No  Physical Activity: Not on file  Stress: Not on file  Social Connections: Socially Integrated (09/27/2023)   Social Connection and Isolation Panel    Frequency of Communication with Friends and Family: More than three times a week    Frequency of Social Gatherings with Friends and Family: Once a week    Attends Religious Services: More than 4 times per year    Active Member of [employer] or Organizations: Yes    Attends Banker Meetings: More than 4 times per year    Marital Status: Married  Catering Manager Violence: Not At Risk (09/27/2023)   Humiliation, Afraid, Rape, and Kick questionnaire    Fear of Current or Ex-Partner: No    Emotionally Abused: No    Physically Abused: No    Sexually Abused: No       Objective:  There were no vitals taken for this visit. {Pulm Vitals (Optional):32837}  Physical Exam  Diagnostic Review:  {Labs (Optional):32838}     Assessment & Plan:   Assessment & Plan    No follow-ups on file.    Marny Patch, MD Pulmonary and Critical Care Medicine [facility]

## 2024-03-03 ENCOUNTER — Ambulatory Visit

## 2024-03-06 NOTE — Telephone Encounter (Signed)
 I called patient and her answering machine stated to not leave a voicemail

## 2024-03-07 ENCOUNTER — Other Ambulatory Visit: Payer: Self-pay

## 2024-03-07 ENCOUNTER — Other Ambulatory Visit (HOSPITAL_COMMUNITY): Payer: Self-pay

## 2024-03-07 MED ORDER — QUETIAPINE FUMARATE 25 MG PO TABS
25.0000 mg | ORAL_TABLET | Freq: Every day | ORAL | 0 refills | Status: DC
  Filled 2024-03-07: qty 30, 30d supply, fill #0

## 2024-03-10 NOTE — Telephone Encounter (Signed)
 I return patient call however her voicemail states to not leave a voicemail

## 2024-03-14 ENCOUNTER — Other Ambulatory Visit (HOSPITAL_COMMUNITY): Payer: Self-pay

## 2024-03-14 ENCOUNTER — Other Ambulatory Visit: Payer: Self-pay

## 2024-03-15 ENCOUNTER — Other Ambulatory Visit (HOSPITAL_COMMUNITY): Payer: Self-pay

## 2024-03-15 ENCOUNTER — Emergency Department (HOSPITAL_COMMUNITY)

## 2024-03-15 ENCOUNTER — Emergency Department (HOSPITAL_COMMUNITY)
Admission: EM | Admit: 2024-03-15 | Discharge: 2024-03-15 | Disposition: A | Discharge status: Home / Self Care | Attending: Emergency Medicine | Admitting: Emergency Medicine

## 2024-03-15 ENCOUNTER — Other Ambulatory Visit: Payer: Self-pay

## 2024-03-15 ENCOUNTER — Encounter (HOSPITAL_COMMUNITY): Payer: Self-pay

## 2024-03-15 DIAGNOSIS — M25551 Pain in right hip: Secondary | ICD-10-CM | POA: Insufficient documentation

## 2024-03-15 DIAGNOSIS — G8929 Other chronic pain: Secondary | ICD-10-CM | POA: Insufficient documentation

## 2024-03-15 DIAGNOSIS — Z96651 Presence of right artificial knee joint: Secondary | ICD-10-CM | POA: Insufficient documentation

## 2024-03-15 DIAGNOSIS — M545 Low back pain, unspecified: Secondary | ICD-10-CM | POA: Insufficient documentation

## 2024-03-15 MED ORDER — OXYCODONE-ACETAMINOPHEN 5-325 MG PO TABS
1.0000 | ORAL_TABLET | Freq: Three times a day (TID) | ORAL | 0 refills | Status: DC | PRN
  Filled 2024-03-15: qty 15, 5d supply, fill #0

## 2024-03-15 MED ORDER — OXYCODONE-ACETAMINOPHEN 5-325 MG PO TABS
2.0000 | ORAL_TABLET | Freq: Once | ORAL | Status: AC
  Administered 2024-03-15: 2 via ORAL
  Filled 2024-03-15: qty 2

## 2024-03-15 MED ORDER — KETOROLAC TROMETHAMINE 15 MG/ML IJ SOLN
15.0000 mg | Freq: Once | INTRAMUSCULAR | Status: AC
  Administered 2024-03-15: 15 mg via INTRAMUSCULAR
  Filled 2024-03-15: qty 1

## 2024-03-15 NOTE — ED Provider Notes (Signed)
 [facility] Provider Note   CSN: [id number] Arrival date & time: 03/15/24  9273     Patient presents with: Back Pain   Janice Ryan is a 68 y.o. female.  68 year old female presents ED with complaints of back pain and right hip pain worsening from a fall 3 weeks ago.  She was seen at [facility] following this fall with no concerning findings but advises this morning when she went to go watch a movie at 3 AM she had significant pain in her lower back and right hip.  Patient denies any falls tonight but does endorse some intermittent numbness over her right buttocks and right thigh that has since stopped.  She reports pain is exacerbated on movement but she feels comfortable when lying still in the bed.  Patient denies any other numbness or tingling.  Patient denies any urinary incontinence or bowel incontinence. Patient reports at baseline she is ambulatory with a walker in her home and uses a wheelchair outside of the home.  She is typically on 4 L of oxygen.  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  She is followed by [facility] for this and has a follow-up with them on Wednesday.  Patient reports that she was given tramadol  by her nurse practitioner but she has since stopped and advised her to follow-up with pain management.  Patient has not made the appointment to establish care with pain management yet.     Prior to Admission medications  Medication Sig Start Date End Date Taking? Authorizing Provider  oxyCODONE -acetaminophen  (PERCOCET/ROXICET) 5-325 MG tablet Take 1 tablet by mouth every 8 (eight) hours as needed for up to 5 days for severe pain (pain score 7-10). 03/15/24 03/20/24 Yes Myriam Fonda RAMAN, PA-C  acetaminophen  (TYLENOL ) 500 MG tablet Take 2 tablets (1,000 mg total) by mouth every 6 (six) hours as needed for mild pain  (pain score 1-3). 09/13/23   Will Almarie MATSU, MD  albuterol  (VENTOLIN  HFA) 108 [phone] Base) MCG/ACT inhaler Inhale 2 puffs into the lungs every 6 (six) hours as needed for wheezing or shortness of breath. 09/13/23   Will Almarie MATSU, MD  albuterol  (VENTOLIN  HFA) 108 [phone] Base) MCG/ACT inhaler Inhale 2 puffs into the lungs every 6 (six) hours as needed for wheezing/shortness of breath. 11/06/23     amphetamine -dextroamphetamine  (ADDERALL XR) 20 MG 24 hr capsule Take 20 mg by mouth daily.    [provider]  amphetamine -dextroamphetamine  (ADDERALL XR) 20 MG 24 hr capsule Take 1 capsule (20 mg total) by mouth daily. 02/06/24     amphetamine -dextroamphetamine  (ADDERALL XR) 20 MG 24 hr capsule Take 1 capsule (20 mg total) by mouth daily. 04/05/24 04/05/24     amphetamine -dextroamphetamine  (ADDERALL XR) 20 MG 24 hr capsule Take 1 capsule (20 mg total) by mouth daily. 03/05/24 03/05/24     busPIRone  (BUSPAR ) 15 MG tablet Take 15 mg by mouth 3 (three) times daily.    [provider]  busPIRone  (BUSPAR ) 15 MG tablet Take 1 tablet (15 mg total) by mouth 3 (three) times daily. 11/08/23     busPIRone  (BUSPAR ) 15 MG tablet Take 1 tablet (15 mg total) by mouth 3 (three) times daily. 02/06/24     cetirizine  (ZYRTEC  ALLERGY) 10 MG tablet Take 1 tablet (10 mg total) by mouth daily. 01/03/23   Jaycee Greig PARAS, NP  cholecalciferol  (  VITAMIN D3) 25 MCG (1000 UNIT) tablet Take 1,000 Units by mouth daily.    [provider]  desvenlafaxine  (PRISTIQ ) 50 MG 24 hr tablet Take 1 tablet (50 mg total) by mouth daily. 11/08/23     diclofenac  (VOLTAREN ) 50 MG EC tablet Take 1 tablet (50 mg total) by mouth daily. 11/08/23     diclofenac  (VOLTAREN ) 75 MG EC tablet Take 1 tablet (75 mg total) by mouth 2 (two) times daily. 12/07/23   Jaycee Greig PARAS, NP  doxepin  (SINEQUAN ) 25 MG capsule Take 1 capsule (25 mg total) by mouth daily. 11/06/23     doxepin  (SINEQUAN ) 50 MG capsule Take 1 capsule (50 mg total) by mouth in the  morning. 07/26/23   Odell Balls, PA-C  doxepin  (SINEQUAN ) 50 MG capsule Take 1 capsule (50 mg total) by mouth at bedtime. 02/06/24     DULoxetine  (CYMBALTA ) 60 MG capsule Take 2 capsules (120 mg total) by mouth daily. 02/15/23   Regalado, Belkys A, MD  DULoxetine  (CYMBALTA ) 60 MG capsule Take 2 capsules (120 mg total) by mouth daily. 06/07/23     DULoxetine  (CYMBALTA ) 60 MG capsule Take 2 capsules (120 mg total) by mouth daily. 11/06/23     DULoxetine  (CYMBALTA ) 60 MG capsule Take 2 capsules (120 mg total) by mouth daily. 08/13/23     DULoxetine  (CYMBALTA ) 60 MG capsule Take 2 capsules (120 mg total) by mouth daily. 02/06/24     fluticasone  (FLONASE ) 50 MCG/ACT nasal spray Place 2 sprays into both nostrils daily. 02/20/22   Donzetta Rollene BRAVO, MD  fluticasone -salmeterol (ADVAIR ) 500-50 MCG/ACT AEPB Inhale 1 puff into the lungs in the morning and at bedtime. 08/01/22   Mayers, Cari S, PA-C  fluticasone -salmeterol (WIXELA INHUB ) 500-50 MCG/ACT AEPB Inhale 1 puff into the lungs 2 (two) times daily. 11/06/23     furosemide  (LASIX ) 40 MG tablet Take 1 tablet (40 mg total) by mouth daily. 10/09/23 10/08/24  Vernon Ranks, MD  furosemide  (LASIX ) 40 MG tablet Take 1 tablet (40 mg total) by mouth daily. 11/06/23     gabapentin  (NEURONTIN ) 300 MG capsule Take 1 capsule (300 mg total) by mouth at bedtime. 08/20/23 11/18/23  Jaycee Greig PARAS, NP  gabapentin  (NEURONTIN ) 300 MG capsule Take 1 capsule (300 mg total) by mouth at bedtime. 11/06/23     lisinopril  (ZESTRIL ) 20 MG tablet Take 1 tablet (20 mg total) by mouth daily. 11/08/23     mirtazapine  (REMERON ) 15 MG tablet Take 1 tablet (15 mg total) by mouth at bedtime. 07/26/23   Odell Balls, PA-C  mirtazapine  (REMERON ) 15 MG tablet Take 1 tablet (15 mg total) by mouth at bedtime. 11/06/23     mirtazapine  (REMERON ) 15 MG tablet Take 1 tablet (15 mg total) by mouth at bedtime. 08/13/23     mirtazapine  (REMERON ) 15 MG tablet Take 1 tablet (15 mg total) by mouth at bedtime. 08/13/23      mirtazapine  (REMERON ) 15 MG tablet Take 1 tablet (15 mg total) by mouth at bedtime. 02/06/24     montelukast  (SINGULAIR ) 10 MG tablet Take 1 tablet (10 mg total) by mouth at bedtime. Patient not taking: Reported on 09/28/2023 01/16/23   Jaycee Greig PARAS, NP  montelukast  (SINGULAIR ) 10 MG tablet Take 1 tablet (10 mg total) by mouth at bedtime. 11/06/23     Multiple Vitamin (MULTIVITAMIN WITH MINERALS) TABS tablet Take 1 tablet by mouth daily.    [provider]  Omega-3 Fatty Acids (OMEGA-3 PO) Take 1 capsule by mouth daily.  [provider]  oxyCODONE  (OXY IR/ROXICODONE ) 5 MG immediate release tablet Take 1 tablet (5 mg total) by mouth every 6 (six) hours as needed for severe pain (pain score 7-10). 11/28/23   Donnajean Lynwood DEL, PA-C  oxyCODONE -acetaminophen  (PERCOCET) 5-325 MG tablet Take 1 every 6 hours for pain not relieved by Tylenol  or Motrin  along 09/26/23   Zammit, Joseph, MD  OXYGEN Inhale 5 L into the lungs continuous.    [provider]  pantoprazole  (PROTONIX ) 40 MG tablet Take 1 tablet (40 mg total) by mouth daily. 03/07/23   Elgergawy, Brayton RAMAN, MD  pantoprazole  (PROTONIX ) 40 MG tablet Take 1 tablet (40 mg total) by mouth daily. 11/06/23     potassium chloride  (KLOR-CON  M) 10 MEQ tablet Take 1 tablet (10 mEq total) by mouth daily. 10/09/23 11/08/23  Vernon Ranks, MD  potassium chloride  (KLOR-CON ) 10 MEQ tablet Take 1 tablet (10 mEq total) by mouth daily. 10/09/23   Vernon Ranks, MD  potassium chloride  SA (KLOR-CON  M) 20 MEQ tablet Take 1 tablet (20 mEq total) by mouth daily. 11/08/23     predniSONE  (DELTASONE ) 10 MG tablet Take 2 tablets (20 mg total) by mouth daily. 09/26/23   Suzette Pac, MD  QUEtiapine  (SEROQUEL ) 25 MG tablet Take 1 tablet (25 mg total) by mouth at bedtime. 03/07/24     traZODone  (DESYREL ) 50 MG tablet Take 50 mg by mouth at bedtime as needed for sleep.    [provider]  traZODone  (DESYREL ) 50 MG tablet Take 1 tablet (50 mg total) by mouth  at bedtime. 11/06/23       Allergies: Sulfa antibiotics    Review of Systems  Musculoskeletal:  Positive for back pain.  All other systems reviewed and are negative.   Updated Vital Signs BP (!) 113/98 (BP Location: Right Wrist)   Pulse 83   Temp 98.1 F (36.7 C) (Oral)   Resp 18   Ht 5' 8 (1.727 m)   Wt (!) 158.8 kg   SpO2 98%   BMI 53.22 kg/m   Physical Exam Vitals and nursing note reviewed.  Constitutional:      General: She is not in acute distress.    Appearance: She is obese. She is not ill-appearing.  HENT:     Head: Normocephalic and atraumatic.     Nose: Nose normal.  Eyes:     Extraocular Movements: Extraocular movements intact.     Conjunctiva/sclera: Conjunctivae normal.     Pupils: Pupils are equal, round, and reactive to light.  Cardiovascular:     Rate and Rhythm: Normal rate.  Pulmonary:     Effort: Pulmonary effort is normal. No respiratory distress.     Breath sounds: Normal breath sounds.     Comments: Patient is on 4 L oxygen which is her baseline.  Lungs are clear to auscultation all fields. Musculoskeletal:        General: Tenderness present. Normal range of motion.     Cervical back: Normal range of motion.     Comments: Tenderness noted throughout the right hip on palpation.  Patient is unable to fully move to 1 side for evaluation of lumbar spine.  Pulses present bilaterally in lower extremities with full range of motion.  Patient does endorse significant pain in right knee which is chronic.  Skin:    General: Skin is warm.     Capillary Refill: Capillary refill takes less than 2 seconds.  Neurological:     General: No focal deficit present.  Mental Status: She is alert.     Comments: Patient reports numbness and tingling earlier this morning through her right buttocks and right leg.  No decrease sensation on exam noted in ED.  Psychiatric:        Mood and Affect: Mood normal.        Behavior: Behavior normal.     (all labs ordered  are listed, but only abnormal results are displayed) Labs Reviewed - No data to display  EKG: None  Radiology: DG Hip Unilat W or Wo Pelvis 2-3 Views Right Result Date: 03/15/2024 CLINICAL DATA:  Hip pain. EXAM: DG HIP (WITH OR WITHOUT PELVIS) 2-3V RIGHT COMPARISON:  No comparison studies available. FINDINGS: No evidence for fracture. No features to suggest right femoral neck fracture. SI joints and symphysis pubis unremarkable. Coarse calcification over the left pelvis suggests fibroid disease. IMPRESSION: 1. No acute bony findings. Electronically Signed   By: Camellia Candle M.D.   On: 03/15/2024 09:57   CT Lumbar Spine Wo Contrast Result Date: 03/15/2024 EXAM: CT OF THE LUMBAR SPINE WITHOUT CONTRAST 03/15/2024 08:23:59 AM TECHNIQUE: CT of the lumbar spine was performed without the administration of intravenous contrast. Multiplanar reformatted images are provided for review. Automated exposure control, iterative reconstruction, and/or weight based adjustment of the mA/kV was utilized to reduce the radiation dose to as low as reasonably achievable. COMPARISON: Lumbar spine MRI 09/26/2023, CT lumbar spine 09/10/2023. CLINICAL HISTORY: 68 year old female. Low back pain, increased fracture risk. FINDINGS: Transitional lumbosacral anatomy designated, with sacralized L5 which was also used on the previous MRI. BONES AND ALIGNMENT: Normal vertebral body heights. No acute fracture or suspicious bone lesion. Normal alignment. Generalized osteopenia. Stable vertebral height. Intact visible sacrum and SI joints. No acute osseous abnormality. SOFT TISSUES: No acute abnormality. Mild calcified atherosclerosis at the aortoiliac bifurcation. DEGENERATIVE CHANGES: Advanced chronic lower lumbar disc degeneration with pronounced chronic vacuum disc at L2-L3, L3-L4, and to a lesser extent L4-L5. Degenerative vacuum phenomenon also in the right side facets at those levels. Similar degenerative vacuum phenomena at the  bilateral SI joints. Combined with lower lumbar facet and ligament flavum degeneration and hypertrophy, mild to moderate multifactorial lumbar spinal stenosis at L2-L3, L3-L4 appears stable from the previous MRI. Moderate left and moderate to severe right L4 neural foraminal stenosis appears stable. IMPRESSION: 1. No acute osseous abnormality. 2. 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 Recommend correlation with radiographs prior to any operative intervention. 3. 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 Up to moderate associated multifactorial spinal stenosis , moderate to severe L4 neural foraminal stenosis. Electronically signed by: Helayne Hurst MD 03/15/2024 08:40 AM EST RP Workstation: [id number]     Procedures   Medications Ordered in the ED  oxyCODONE -acetaminophen  (PERCOCET/ROXICET) 5-325 MG per tablet 2 tablet (2 tablets Oral Given 03/15/24 0800)  ketorolac  (TORADOL ) 15 MG/ML injection 15 mg (15 mg Intramuscular Given 03/15/24 1019)    68 y.o. female presents to the ED with complaints of low back pain and right hip pain,  The differential diagnosis includes but not limited to cauda equina, compression fraction/other fracture, arthritis, musculoskeletal strain, (Ddx)  On arrival pt is nontoxic, vitals unremarkable. Exam significant for pain to palpation over the right hip  I ordered medication oxycodone  for pain  Lab Tests:  I Ordered, reviewed, and interpreted labs, which included:   Imaging Studies ordered:  I ordered imaging studies which included CT lumbar spine and right hip x-ray.  CT lumbar spine showed no acute osseous abnormality, transitional lumbosacral anatomy with sacralized L5 designation.  And chronic severe lumbar spine degeneration.  These results are similar to the MRI in June which noted transitional lumbosacral anatomy, severe neural foraminal stenosis at L4-L5, and mild to moderate spinal  stenosis at L2-3 L3-4 L4-5.   ED Course:   Patient is sitting comfortably in ED bed, movement does exacerbate pain in the right hip.  CT lumbar spine with will be ordered for evaluation and right hip x-ray.   Patient was reevaluated after results of CT lumbar spine resulted.  Patient reports oxycodone  helped with the pain.  She is still reporting some residual pain and Toradol  will be trialed in ED.  Patient reports she is never told she cannot take NSAIDs and has been prescribed them in the past by her PCP.  Hip x-ray was unremarkable.  It was advised the patient to follow-up with her established appointment on Wednesday with orthopedics.  Patient is requesting short course of pain management until then.  Patient will be given short course and strict return precautions.  Patient agreed with treatment plan and comfortable with discharge at this time.  She uses wheelchair and rollator at home and in public as needed and has family at home for assistance.  Patient is already established with transport for doctors appointments.  Portions of this note were generated with Scientist, clinical (histocompatibility and immunogenetics). Dictation errors may occur despite best attempts at proofreading.   Final diagnoses:  Pain of right hip  Chronic right-sided low back pain without sciatica    ED Discharge Orders          Ordered    oxyCODONE -acetaminophen  (PERCOCET/ROXICET) 5-325 MG tablet  Every 8 hours PRN        03/15/24 1020               Myriam Fonda RAMAN, [place] 03/15/24 1248    Freddi Hamilton, MD 03/16/24 [phone]

## 2024-03-15 NOTE — Discharge Instructions (Signed)
 Imaging of your hip was reassuring today.  The imaging of your lower back was consistent with your MRI.  I have given you a short course of pain medicine to hold you over until you see orthopedics on Wednesday at your established appointment.  Its very important to follow-up with Ortho and follow all recommendations to help with your pain.  Please monitor for any new or worsening symptoms.  If you have any concerns please return to the ED for further evaluation.

## 2024-03-15 NOTE — ED Notes (Signed)
 Pt back from ct, pt reports a slight decrease in pain, but pt sill c/o pain

## 2024-03-15 NOTE — ED Notes (Signed)
 Read and reviewed d/c instructions, report given to PTAR, pt states that she is ready to go home, pt from department.

## 2024-03-15 NOTE — ED Notes (Signed)
 Meds from outpatient [facility] and had delivered to pt, pt is now in possession of her medications.

## 2024-03-15 NOTE — ED Triage Notes (Signed)
 Pt to er hallway 18 via ems, per ems pt is here for back pain, pt states that she has some back pain and hip pain, states that her pain goes down into her R leg, states that her L leg feels tingly. Pt states that her pain started around 3am when she was going to her recliner to watch a movie. Pt reports pain with movement.

## 2024-03-18 ENCOUNTER — Other Ambulatory Visit (HOSPITAL_COMMUNITY): Payer: Self-pay

## 2024-03-20 ENCOUNTER — Other Ambulatory Visit: Payer: Self-pay

## 2024-03-20 ENCOUNTER — Inpatient Hospital Stay (HOSPITAL_COMMUNITY)
Admission: EM | Admit: 2024-03-20 | Discharge: 2024-03-26 | Disposition: A | Discharge status: Home Health | Attending: Internal Medicine | Admitting: Internal Medicine

## 2024-03-20 DIAGNOSIS — G253 Myoclonus: Secondary | ICD-10-CM | POA: Diagnosis present

## 2024-03-20 DIAGNOSIS — I10 Essential (primary) hypertension: Secondary | ICD-10-CM | POA: Diagnosis present

## 2024-03-20 DIAGNOSIS — M5116 Intervertebral disc disorders with radiculopathy, lumbar region: Secondary | ICD-10-CM | POA: Diagnosis present

## 2024-03-20 DIAGNOSIS — M25551 Pain in right hip: Secondary | ICD-10-CM | POA: Diagnosis present

## 2024-03-20 DIAGNOSIS — E662 Morbid (severe) obesity with alveolar hypoventilation: Secondary | ICD-10-CM | POA: Diagnosis present

## 2024-03-20 DIAGNOSIS — G8929 Other chronic pain: Secondary | ICD-10-CM | POA: Diagnosis present

## 2024-03-20 DIAGNOSIS — N179 Acute kidney failure, unspecified: Secondary | ICD-10-CM | POA: Diagnosis not present

## 2024-03-20 DIAGNOSIS — M545 Low back pain, unspecified: Secondary | ICD-10-CM | POA: Diagnosis present

## 2024-03-20 DIAGNOSIS — Z6841 Body Mass Index (BMI) 40.0 and over, adult: Secondary | ICD-10-CM

## 2024-03-20 DIAGNOSIS — D631 Anemia in chronic kidney disease: Secondary | ICD-10-CM | POA: Diagnosis present

## 2024-03-20 DIAGNOSIS — J9612 Chronic respiratory failure with hypercapnia: Secondary | ICD-10-CM | POA: Diagnosis present

## 2024-03-20 DIAGNOSIS — F411 Generalized anxiety disorder: Secondary | ICD-10-CM | POA: Diagnosis present

## 2024-03-20 DIAGNOSIS — Z8249 Family history of ischemic heart disease and other diseases of the circulatory system: Secondary | ICD-10-CM

## 2024-03-20 DIAGNOSIS — F32A Depression, unspecified: Secondary | ICD-10-CM | POA: Diagnosis present

## 2024-03-20 DIAGNOSIS — I5032 Chronic diastolic (congestive) heart failure: Secondary | ICD-10-CM | POA: Diagnosis present

## 2024-03-20 DIAGNOSIS — M25561 Pain in right knee: Secondary | ICD-10-CM | POA: Diagnosis present

## 2024-03-20 DIAGNOSIS — M48061 Spinal stenosis, lumbar region without neurogenic claudication: Principal | ICD-10-CM | POA: Diagnosis present

## 2024-03-20 DIAGNOSIS — G4733 Obstructive sleep apnea (adult) (pediatric): Secondary | ICD-10-CM | POA: Diagnosis present

## 2024-03-20 DIAGNOSIS — J9611 Chronic respiratory failure with hypoxia: Secondary | ICD-10-CM | POA: Diagnosis present

## 2024-03-20 DIAGNOSIS — Z9981 Dependence on supplemental oxygen: Secondary | ICD-10-CM

## 2024-03-20 DIAGNOSIS — Z5982 Transportation insecurity: Secondary | ICD-10-CM

## 2024-03-20 DIAGNOSIS — Z882 Allergy status to sulfonamides status: Secondary | ICD-10-CM

## 2024-03-20 DIAGNOSIS — N1831 Chronic kidney disease, stage 3a: Secondary | ICD-10-CM | POA: Diagnosis present

## 2024-03-20 DIAGNOSIS — I130 Hypertensive heart and chronic kidney disease with heart failure and stage 1 through stage 4 chronic kidney disease, or unspecified chronic kidney disease: Secondary | ICD-10-CM | POA: Diagnosis present

## 2024-03-20 DIAGNOSIS — Z79899 Other long term (current) drug therapy: Secondary | ICD-10-CM

## 2024-03-20 DIAGNOSIS — E860 Dehydration: Secondary | ICD-10-CM | POA: Diagnosis present

## 2024-03-20 DIAGNOSIS — I959 Hypotension, unspecified: Secondary | ICD-10-CM | POA: Diagnosis present

## 2024-03-20 DIAGNOSIS — M4726 Other spondylosis with radiculopathy, lumbar region: Secondary | ICD-10-CM | POA: Diagnosis present

## 2024-03-20 LAB — SODIUM, URINE, RANDOM: Sodium, Ur: 41 mmol/L

## 2024-03-20 LAB — URINALYSIS, W/ REFLEX TO CULTURE (INFECTION SUSPECTED)
Bacteria, UA: NONE SEEN
Glucose, UA: NEGATIVE mg/dL
Hgb urine dipstick: NEGATIVE
Ketones, ur: NEGATIVE mg/dL
Leukocytes,Ua: NEGATIVE
Nitrite: NEGATIVE
Protein, ur: NEGATIVE mg/dL
RBC / HPF: NONE SEEN RBC/hpf (ref 0–5)
Specific Gravity, Urine: 1.025 (ref 1.005–1.030)
WBC, UA: NONE SEEN WBC/hpf (ref 0–5)
pH: 5.5 (ref 5.0–8.0)

## 2024-03-20 LAB — CBC WITH DIFFERENTIAL/PLATELET
Abs Immature Granulocytes: 0.04 K/uL (ref 0.00–0.07)
Basophils Absolute: 0.1 K/uL (ref 0.0–0.1)
Basophils Relative: 1 %
Eosinophils Absolute: 0.1 K/uL (ref 0.0–0.5)
Eosinophils Relative: 1 %
HCT: 36.7 % (ref 36.0–46.0)
Hemoglobin: 10.9 g/dL — ABNORMAL LOW (ref 12.0–15.0)
Immature Granulocytes: 0 %
Lymphocytes Relative: 25 %
Lymphs Abs: 2.3 K/uL (ref 0.7–4.0)
MCH: 28.7 pg (ref 26.0–34.0)
MCHC: 29.7 g/dL — ABNORMAL LOW (ref 30.0–36.0)
MCV: 96.6 fL (ref 80.0–100.0)
Monocytes Absolute: 1 K/uL (ref 0.1–1.0)
Monocytes Relative: 10 %
Neutro Abs: 5.8 K/uL (ref 1.7–7.7)
Neutrophils Relative %: 63 %
Platelets: 287 K/uL (ref 150–400)
RBC: 3.8 MIL/uL — ABNORMAL LOW (ref 3.87–5.11)
RDW: 13 % (ref 11.5–15.5)
WBC: 9.2 K/uL (ref 4.0–10.5)
nRBC: 0.2 % (ref 0.0–0.2)

## 2024-03-20 LAB — HIV ANTIBODY (ROUTINE TESTING W REFLEX): HIV Screen 4th Generation wRfx: NONREACTIVE

## 2024-03-20 LAB — I-STAT CHEM 8, ED
BUN: 19 mg/dL (ref 8–23)
Calcium, Ion: 1.3 mmol/L (ref 1.15–1.40)
Chloride: 92 mmol/L — ABNORMAL LOW (ref 98–111)
Creatinine, Ser: 1.9 mg/dL — ABNORMAL HIGH (ref 0.44–1.00)
Glucose, Bld: 101 mg/dL — ABNORMAL HIGH (ref 70–99)
HCT: 38 % (ref 36.0–46.0)
Hemoglobin: 12.9 g/dL (ref 12.0–15.0)
Potassium: 4 mmol/L (ref 3.5–5.1)
Sodium: 138 mmol/L (ref 135–145)
TCO2: 39 mmol/L — ABNORMAL HIGH (ref 22–32)

## 2024-03-20 LAB — COMPREHENSIVE METABOLIC PANEL WITH GFR
ALT: 7 U/L (ref 0–44)
AST: 15 U/L (ref 15–41)
Albumin: 4.2 g/dL (ref 3.5–5.0)
Alkaline Phosphatase: 88 U/L (ref 38–126)
Anion gap: 7 (ref 5–15)
BUN: 17 mg/dL (ref 8–23)
CO2: 37 mmol/L — ABNORMAL HIGH (ref 22–32)
Calcium: 11.1 mg/dL — ABNORMAL HIGH (ref 8.9–10.3)
Chloride: 94 mmol/L — ABNORMAL LOW (ref 98–111)
Creatinine, Ser: 1.61 mg/dL — ABNORMAL HIGH (ref 0.44–1.00)
GFR, Estimated: 35 mL/min — ABNORMAL LOW (ref >60)
Glucose, Bld: 98 mg/dL (ref 70–99)
Potassium: 4 mmol/L (ref 3.5–5.1)
Sodium: 138 mmol/L (ref 135–145)
Total Bilirubin: 0.4 mg/dL (ref 0.0–1.2)
Total Protein: 7.5 g/dL (ref 6.5–8.1)

## 2024-03-20 LAB — OSMOLALITY, URINE: Osmolality, Ur: 401 mosm/kg (ref 300–900)

## 2024-03-20 LAB — CREATININE, URINE, RANDOM: Creatinine, Urine: 215 mg/dL

## 2024-03-20 LAB — URIC ACID: Uric Acid, Serum: 7.1 mg/dL (ref 2.5–7.1)

## 2024-03-20 LAB — OSMOLALITY: Osmolality: 291 mosm/kg (ref 275–295)

## 2024-03-20 MED ORDER — LACTATED RINGERS IV SOLN: INTRAVENOUS | Status: DC

## 2024-03-20 MED ORDER — DULOXETINE HCL 60 MG PO CPEP
120.0000 mg | ORAL_CAPSULE | Freq: Every day | ORAL | Status: DC
  Administered 2024-03-20 – 2024-03-23 (×4): 120 mg via ORAL
  Filled 2024-03-20 (×3): qty 2
  Filled 2024-03-20: qty 4

## 2024-03-20 MED ORDER — TRAMADOL HCL 50 MG PO TABS
50.0000 mg | ORAL_TABLET | Freq: Four times a day (QID) | ORAL | Status: DC | PRN
  Administered 2024-03-20 – 2024-03-22 (×4): 50 mg via ORAL
  Filled 2024-03-20 (×4): qty 1

## 2024-03-20 MED ORDER — HYDROCODONE-ACETAMINOPHEN 5-325 MG PO TABS
1.0000 | ORAL_TABLET | Freq: Four times a day (QID) | ORAL | Status: DC | PRN
  Administered 2024-03-20 – 2024-03-23 (×7): 1 via ORAL
  Filled 2024-03-20 (×8): qty 1

## 2024-03-20 MED ORDER — DOXEPIN HCL 25 MG PO CAPS
50.0000 mg | ORAL_CAPSULE | Freq: Every day | ORAL | Status: DC
  Administered 2024-03-20 – 2024-03-22 (×3): 50 mg via ORAL
  Filled 2024-03-20: qty 1
  Filled 2024-03-20 (×4): qty 2
  Filled 2024-03-20: qty 1

## 2024-03-20 MED ORDER — ALBUTEROL SULFATE (2.5 MG/3ML) 0.083% IN NEBU: 2.5000 mg | INHALATION_SOLUTION | Freq: Four times a day (QID) | RESPIRATORY_TRACT | Status: DC | PRN

## 2024-03-20 MED ORDER — HYDRALAZINE HCL 20 MG/ML IJ SOLN: 10.0000 mg | Freq: Four times a day (QID) | INTRAMUSCULAR | Status: DC | PRN

## 2024-03-20 MED ORDER — VENLAFAXINE HCL ER 75 MG PO CP24
75.0000 mg | ORAL_CAPSULE | Freq: Every day | ORAL | Status: DC
  Administered 2024-03-21 – 2024-03-23 (×3): 75 mg via ORAL
  Filled 2024-03-20 (×4): qty 1

## 2024-03-20 MED ORDER — HEPARIN SODIUM (PORCINE) 5000 UNIT/ML IJ SOLN
5000.0000 [IU] | Freq: Three times a day (TID) | INTRAMUSCULAR | Status: DC
  Administered 2024-03-20 – 2024-03-26 (×18): 5000 [IU] via SUBCUTANEOUS
  Filled 2024-03-20 (×18): qty 1

## 2024-03-20 MED ORDER — POLYETHYLENE GLYCOL 3350 17 G PO PACK
17.0000 g | PACK | Freq: Every day | ORAL | Status: DC | PRN
  Administered 2024-03-21: 17 g via ORAL
  Filled 2024-03-20 (×2): qty 1

## 2024-03-20 MED ORDER — PANTOPRAZOLE SODIUM 40 MG PO TBEC
40.0000 mg | DELAYED_RELEASE_TABLET | Freq: Every day | ORAL | Status: DC
  Administered 2024-03-20 – 2024-03-26 (×7): 40 mg via ORAL
  Filled 2024-03-20 (×7): qty 1

## 2024-03-20 MED ORDER — ONDANSETRON HCL 4 MG/2ML IJ SOLN: 4.0000 mg | Freq: Four times a day (QID) | INTRAMUSCULAR | Status: DC | PRN

## 2024-03-20 MED ORDER — MIRTAZAPINE 15 MG PO TABS
15.0000 mg | ORAL_TABLET | Freq: Every day | ORAL | Status: DC
  Administered 2024-03-20 – 2024-03-25 (×6): 15 mg via ORAL
  Filled 2024-03-20 (×6): qty 1

## 2024-03-20 MED ORDER — SODIUM CHLORIDE 0.9% FLUSH
3.0000 mL | Freq: Two times a day (BID) | INTRAVENOUS | Status: DC
  Administered 2024-03-20 – 2024-03-25 (×11): 3 mL via INTRAVENOUS

## 2024-03-20 MED ORDER — ONDANSETRON HCL 4 MG PO TABS: 4.0000 mg | ORAL_TABLET | Freq: Four times a day (QID) | ORAL | Status: DC | PRN

## 2024-03-20 MED ORDER — LACTATED RINGERS IV SOLN: INTRAVENOUS | Status: AC

## 2024-03-20 MED ORDER — GABAPENTIN 300 MG PO CAPS
300.0000 mg | ORAL_CAPSULE | Freq: Two times a day (BID) | ORAL | Status: DC
  Administered 2024-03-20 – 2024-03-24 (×9): 300 mg via ORAL
  Filled 2024-03-20 (×9): qty 1

## 2024-03-20 MED ORDER — ACETAMINOPHEN 500 MG PO TABS
1000.0000 mg | ORAL_TABLET | Freq: Four times a day (QID) | ORAL | Status: DC | PRN
  Administered 2024-03-20 – 2024-03-22 (×4): 1000 mg via ORAL
  Filled 2024-03-20 (×4): qty 2

## 2024-03-20 MED ORDER — NALOXONE HCL 0.4 MG/ML IJ SOLN: 0.4000 mg | INTRAMUSCULAR | Status: DC | PRN

## 2024-03-20 MED ADMIN — Hydromorphone HCl Inj 1 MG/ML: 0.5 mg | INTRAVENOUS | @ 04:00:00 | NDC 76045000901

## 2024-03-20 MED ADMIN — Acetaminophen Tab 500 MG: 1000 mg | ORAL | @ 04:00:00 | NDC 50580045711

## 2024-03-20 MED FILL — Hydromorphone HCl Inj 1 MG/ML: 0.5000 mg | INTRAMUSCULAR | Qty: 1 | Status: AC

## 2024-03-20 MED FILL — Acetaminophen Tab 500 MG: 1000.0000 mg | ORAL | Qty: 2 | Status: AC

## 2024-03-20 NOTE — ED Provider Notes (Signed)
 [facility] AT [facility] Provider Note  CSN: [id number] Arrival date & time: 03/20/24 0248  Chief Complaint(s) Back Pain  HPI & MDM Janice Ryan is a 68 y.o. female here for . History provided by patient.  Back Pain   Clinical Course as of 03/20/24 0726  Thu Mar 20, 2024  [id number] Patient presents for persistent lower back pain with intermittent radiation to the right leg.  Patient was seen several days ago and had CT scan without acute fracture but showed stable foraminal stenosis of L4/L5 that was previously noted on MRI from June 2025.  Patient was discharged with oxycodone  which did provide some relief.  Patient states that the pain is limiting her mobility.  Denies any bladder/bowel incontinence.  No lower extremity weakness or loss of sensation.  Denies any fall or trauma.  Does report that she has chronic right knee pain due to ligamentous injury and arthritis.  Reports that she is currently seeking orthopedic evaluation but missed her appointment today due to the fact that her insurance provided transportation was not able to take her today.  She had to reschedule her appointment for this upcoming Monday. [PC]  0335 Differential diagnosis considered.  Workup below.  No recent trauma since CT was obtained.  No need for repeat imaging at this time.    Patient likely has sciatica secondary to a foraminal stenosis versus muscle strain/spasm of the gluteal muscles versus gluteal tendinopathy.  Will check patient's renal function to assess ability to use NSAIDs.  If stable, will DC on a regimen of Tylenol , NSAIDs and muscle relaxer.  Recommend neurosurgery follow-up for further evaluation if her orthopedic doctor does not address spinal processes.  [PC]  [phone] Workup was notable for AKI.  Patient reports hydrating well.  Possibly intrinsic/medicine related.  No bladder obstruction.  No urinary tract infection.   [PC]  [phone] Will admit to  medicine for further work up and management [PC]  0725 Spoke with Dr. Dennise from Hospitalist service who will evaluate for admission [PC]    Clinical Course User Index [PC] Andyn Sales, Raynell Moder, MD   Medical Decision Making Amount and/or Complexity of Data Reviewed Labs: ordered. Decision-making details documented in ED Course.  Risk OTC drugs. Prescription drug management. Parenteral controlled substances. Decision regarding hospitalization.    Final Clinical Impression(s) / ED Diagnoses Final diagnoses:  AKI (acute kidney injury)     Past Medical History Past Medical History:  Diagnosis Date   Acute respiratory failure with hypoxia (HCC)    Asthma    Chronic hypercapnic respiratory failure (HCC)    Hypertension    Morbid obesity (HCC)    Obesity with alveolar hypoventilation and body mass index (BMI) of 40 or greater (HCC)    OSA (obstructive sleep apnea)    Patient Active Problem List   Diagnosis Date Noted   Abdominal pain 12/26/2023   Low back pain 09/27/2023   Acute encephalopathy 09/27/2023   Allergic rhinitis 09/27/2023   Acute on chronic respiratory failure with hypercapnia (HCC) 09/26/2023   Mild intermittent asthma 09/13/2023   CAP (community acquired pneumonia) 09/11/2023   Ground-level fall 09/11/2023   Acute right hip pain 09/11/2023   Mood disorder 09/11/2023   Hypokalemia 02/25/2023   Morbid obesity (HCC) 02/25/2023   OSA (obstructive sleep apnea) 02/07/2023   Obesity hypoventilation syndrome (HCC) 02/07/2023   Asthma, chronic, unspecified asthma severity, with acute exacerbation 02/07/2023   Suspected novel influenza A virus infection 02/01/2022   AKI (  acute kidney injury) 01/25/2022   Hypercalcemia 01/24/2022   Pneumonia due to infectious organism 01/23/2022   Acute on chronic respiratory failure with hypoxia and hypercapnia (HCC) 01/21/2022   Fall at home, initial encounter 01/21/2022   Poor social situation 01/21/2022   Primary  osteoarthritis of right knee 10/21/2019   Depression 03/31/2019   Major depressive disorder, recurrent episode, mild 03/31/2019   Chronic respiratory failure with hypoxia (HCC) 12/10/2018   Essential hypertension 12/10/2018   GAD (generalized anxiety disorder) 12/10/2018   Chronic pain of right knee    Moderate persistent asthma 07/28/2018   Contusion of left knee 09/20/2017   Sprain of anterior talofibular ligament of left ankle 09/20/2017   Home Medication(s) Prior to Admission medications  Medication Sig Start Date End Date Taking? Authorizing Provider  ondansetron  (ZOFRAN -ODT) 4 MG disintegrating tablet Take 4 mg by mouth every 6 (six) hours as needed. 11/06/23  Yes [provider]  acetaminophen  (TYLENOL ) 500 MG tablet Take 2 tablets (1,000 mg total) by mouth every 6 (six) hours as needed for mild pain (pain score 1-3). 09/13/23   Will Almarie MATSU, MD  albuterol  (VENTOLIN  HFA) 108 [phone] Base) MCG/ACT inhaler Inhale 2 puffs into the lungs every 6 (six) hours as needed for wheezing or shortness of breath. 09/13/23   Will Almarie MATSU, MD  albuterol  (VENTOLIN  HFA) 108 [phone] Base) MCG/ACT inhaler Inhale 2 puffs into the lungs every 6 (six) hours as needed for wheezing/shortness of breath. 11/06/23     amphetamine -dextroamphetamine  (ADDERALL XR) 20 MG 24 hr capsule Take 1 capsule (20 mg total) by mouth daily. 04/05/24 04/05/24     amphetamine -dextroamphetamine  (ADDERALL XR) 20 MG 24 hr capsule Take 1 capsule (20 mg total) by mouth daily. 03/05/24 03/05/24     busPIRone  (BUSPAR ) 15 MG tablet Take 1 tablet (15 mg total) by mouth 3 (three) times daily. 11/08/23     busPIRone  (BUSPAR ) 15 MG tablet Take 1 tablet (15 mg total) by mouth 3 (three) times daily. 02/06/24     cetirizine  (ZYRTEC  ALLERGY) 10 MG tablet Take 1 tablet (10 mg total) by mouth daily. 01/03/23   Jaycee Greig PARAS, NP  cholecalciferol  (VITAMIN D3) 25 MCG (1000 UNIT) tablet Take 1,000 Units by mouth daily.    [provider]   desvenlafaxine  (PRISTIQ ) 50 MG 24 hr tablet Take 1 tablet (50 mg total) by mouth daily. 11/08/23     diclofenac  (VOLTAREN ) 50 MG EC tablet Take 1 tablet (50 mg total) by mouth daily. 11/08/23     diclofenac  (VOLTAREN ) 75 MG EC tablet Take 1 tablet (75 mg total) by mouth 2 (two) times daily. 12/07/23   Jaycee Greig PARAS, NP  doxepin  (SINEQUAN ) 25 MG capsule Take 1 capsule (25 mg total) by mouth daily. 11/06/23     doxepin  (SINEQUAN ) 50 MG capsule Take 1 capsule (50 mg total) by mouth at bedtime. 02/06/24     DULoxetine  (CYMBALTA ) 60 MG capsule Take 2 capsules (120 mg total) by mouth daily. 06/07/23     DULoxetine  (CYMBALTA ) 60 MG capsule Take 2 capsules (120 mg total) by mouth daily. 11/06/23     DULoxetine  (CYMBALTA ) 60 MG capsule Take 2 capsules (120 mg total) by mouth daily. 08/13/23     DULoxetine  (CYMBALTA ) 60 MG capsule Take 2 capsules (120 mg total) by mouth daily. 02/06/24     fluticasone  (FLONASE ) 50 MCG/ACT nasal spray Place 2 sprays into both nostrils daily. 02/20/22   Donzetta Rollene BRAVO, MD  fluticasone -salmeterol (WIXELA INHUB ) 500-50 MCG/ACT  AEPB Inhale 1 puff into the lungs 2 (two) times daily. 11/06/23     furosemide  (LASIX ) 40 MG tablet Take 1 tablet (40 mg total) by mouth daily. 11/06/23     gabapentin  (NEURONTIN ) 300 MG capsule Take 1 capsule (300 mg total) by mouth at bedtime. 11/06/23     ipratropium-albuterol  (DUONEB) 0.5-2.5 (3) MG/3ML SOLN Inhale 3 mLs into the lungs every 4 (four) hours as needed (Wheezing/SOB). 10/29/23   [provider]  lisinopril  (ZESTRIL ) 20 MG tablet Take 1 tablet (20 mg total) by mouth daily. 11/08/23     mirtazapine  (REMERON ) 15 MG tablet Take 1 tablet (15 mg total) by mouth at bedtime. 11/06/23     mirtazapine  (REMERON ) 15 MG tablet Take 1 tablet (15 mg total) by mouth at bedtime. 08/13/23     mirtazapine  (REMERON ) 15 MG tablet Take 1 tablet (15 mg total) by mouth at bedtime. 02/06/24     montelukast  (SINGULAIR ) 10 MG tablet Take 1 tablet (10 mg total) by mouth at  bedtime. 11/06/23     Multiple Vitamin (MULTIVITAMIN WITH MINERALS) TABS tablet Take 1 tablet by mouth daily.    [provider]  Omega-3 Fatty Acids (OMEGA-3 PO) Take 1 capsule by mouth daily.    [provider]  oxyCODONE  (OXY IR/ROXICODONE ) 5 MG immediate release tablet Take 1 tablet (5 mg total) by mouth every 6 (six) hours as needed for severe pain (pain score 7-10). 11/28/23   Donnajean Lynwood DEL, PA-C  oxyCODONE -acetaminophen  (PERCOCET/ROXICET) 5-325 MG tablet Take 1 tablet by mouth every 8 (eight) hours as needed for up to 5 days for severe pain (pain score 7-10). 03/15/24 03/20/24  Bundy, Joshua S, PA-C  OXYGEN Inhale 5 L into the lungs continuous.    [provider]  pantoprazole  (PROTONIX ) 40 MG tablet Take 1 tablet (40 mg total) by mouth daily. 03/07/23   Elgergawy, Brayton RAMAN, MD  pantoprazole  (PROTONIX ) 40 MG tablet Take 1 tablet (40 mg total) by mouth daily. 11/06/23     potassium chloride  (KLOR-CON  M) 10 MEQ tablet Take 1 tablet (10 mEq total) by mouth daily. 10/09/23 11/08/23  Vernon Ranks, MD  potassium chloride  (KLOR-CON ) 10 MEQ tablet Take 1 tablet (10 mEq total) by mouth daily. 10/09/23   Vernon Ranks, MD  potassium chloride  SA (KLOR-CON  M) 20 MEQ tablet Take 1 tablet (20 mEq total) by mouth daily. 11/08/23     predniSONE  (DELTASONE ) 10 MG tablet Take 2 tablets (20 mg total) by mouth daily. 09/26/23   Suzette Pac, MD  QUEtiapine  (SEROQUEL ) 25 MG tablet Take 1 tablet (25 mg total) by mouth at bedtime. 03/07/24     traZODone  (DESYREL ) 50 MG tablet Take 50 mg by mouth at bedtime as needed for sleep.    [provider]  traZODone  (DESYREL ) 50 MG tablet Take 1 tablet (50 mg total) by mouth at bedtime. 11/06/23  Allergies Sulfa antibiotics  Review of Systems Review of Systems  Musculoskeletal:  Positive for back pain.   As noted  in HPI  Physical Exam Vital Signs  I have reviewed the triage vital signs BP 130/77 (BP Location: Right Arm)   Pulse 81   Temp 98 F (36.7 C) (Oral)   Resp 16   Ht 5' 8 (1.727 m)   Wt (!) 186.9 kg   SpO2 99%   BMI 62.64 kg/m   Physical Exam Vitals reviewed.  Constitutional:      General: She is not in acute distress.    Appearance: She is well-developed. She is morbidly obese. She is not diaphoretic.  HENT:     Head: Normocephalic and atraumatic.     Right Ear: External ear normal.     Left Ear: External ear normal.     Nose: Nose normal.  Eyes:     General: No scleral icterus.    Conjunctiva/sclera: Conjunctivae normal.  Neck:     Trachea: Phonation normal.  Cardiovascular:     Rate and Rhythm: Normal rate and regular rhythm.  Pulmonary:     Effort: Pulmonary effort is normal. No respiratory distress.     Breath sounds: No stridor.  Abdominal:     General: There is no distension.  Musculoskeletal:        General: Normal range of motion.     Cervical back: Normal range of motion.     Lumbar back: Tenderness present. No bony tenderness.       Back:     Comments: Spine Exam: Strength: 5/5 throughout LE bilaterally  Sensation: Intact to light touch in proximal and distal LE bilaterally Reflexes: no clonus   Neurological:     Mental Status: She is alert and oriented to person, place, and time.  Psychiatric:        Behavior: Behavior normal.     ED Results and Treatments Labs (all labs ordered are listed, but only abnormal results are displayed) Labs Reviewed  CBC WITH DIFFERENTIAL/PLATELET - Abnormal; Notable for the following components:      Result Value   RBC 3.80 (*)    Hemoglobin 10.9 (*)    MCHC 29.7 (*)    All other components within normal limits  COMPREHENSIVE METABOLIC PANEL WITH GFR - Abnormal; Notable for the following components:   Chloride 94 (*)    CO2 37 (*)    Creatinine, Ser 1.61 (*)    Calcium 11.1 (*)    GFR, Estimated 35 (*)     All other components within normal limits  URINALYSIS, W/ REFLEX TO CULTURE (INFECTION SUSPECTED) - Abnormal; Notable for the following components:   APPearance CLOUDY (*)    Bilirubin Urine SMALL (*)    All other components within normal limits  I-STAT CHEM 8, ED - Abnormal; Notable for the following components:   Chloride 92 (*)    Creatinine, Ser 1.90 (*)    Glucose, Bld 101 (*)    TCO2 39 (*)    All other components within normal limits  OSMOLALITY  URIC ACID  UREA NITROGEN, URINE  SODIUM, URINE, RANDOM  OSMOLALITY, URINE  CREATININE, URINE, RANDOM  EKG  EKG Interpretation Date/Time:    Ventricular Rate:    PR Interval:    QRS Duration:    QT Interval:    QTC Calculation:   R Axis:      Text Interpretation:         Radiology No results found.  Medications Ordered in ED Medications  lactated ringers  infusion (has no administration in time range)  acetaminophen  (TYLENOL ) tablet 1,000 mg (1,000 mg Oral Given 03/20/24 0423)  HYDROmorphone  (DILAUDID ) injection 0.5 mg (0.5 mg Intravenous Given 03/20/24 0424)   Procedures Procedures  (including critical care time)   This chart was dictated using voice recognition software.  Despite best efforts to proofread,  errors can occur which can change the documentation meaning.   Trine Raynell Moder, MD 03/20/24 [phone]

## 2024-03-20 NOTE — Progress Notes (Signed)
 PT Cancellation Note  Patient Details Name: Janice Ryan MRN: [id number] DOB: 1956-02-03   Cancelled Treatment:    Reason Eval/Treat Not Completed: Pain limiting ability to participate; will follow up for PT Evaluation as schedule permits.  Cherye Gaertner, PT, DPT Acute Rehabilitation Services  Personal: Secure Chat Rehab Office: [phone]  Latreece Mochizuki L Jade Burkard 03/20/2024, 3:09 PM

## 2024-03-20 NOTE — H&P (Signed)
 TRH H&P   Patient Demographics:    Janice Ryan, is a 68 y.o. female  MRN: [id number]   DOB - Oct 10, 1955  Admit Date - 03/20/2024  Outpatient Primary MD for the patient is Jaycee Greig PARAS, NP  Outpatient Specialists: Dr Sherida Gaba for back pain  Patient coming from: Home, [facility] ER  Chief Complaint  Patient presents with   Back Pain      HPI:    Janice Ryan  is a 68 y.o. female, with history of morbid obesity BMI greater than 60, OSA on nighttime BiPAP, OHS uses daytime 5 L nasal cannula oxygen, chronic back pain, chronic right hip, right knee pain likely referred back pain/sciatica, chronic diastolic CHF, CKD stage IIIa baseline creatinine around 1.5, hypertension, depression.  Patient who walks with a walker at home and also uses wheelchair at times, has chronic back pain along with chronic right hip and knee pain, presents to the hospital with gradually worsening of her back pain, she claims she is only taking Neurontin  and no narcotics at this time, she denies any tingling numbness, no weakness in her lower extremities, no loss of sensation, no problems controlling bowel or bladder, no fever or chills.   Review of systems:     A full 10 point Review of Systems was done, except as stated above, all other Review of Systems were negative.  With Past History of the following :    Past Medical History:  Diagnosis Date   Acute respiratory failure with hypoxia (HCC)    Asthma    Chronic hypercapnic respiratory failure (HCC)    Hypertension    Morbid obesity (HCC)    Obesity with alveolar hypoventilation and body mass index (BMI) of 40 or greater (HCC)    OSA  (obstructive sleep apnea)       Past Surgical History:  Procedure Laterality Date   HERNIA REPAIR     JOINT REPLACEMENT Left    knee      Social History:     Social History   Tobacco Use   Smoking status: Never   Smokeless tobacco: Never  Substance Use Topics   Alcohol  use: Never     Family History :     Family History  Problem  Relation Age of Onset   Atrial fibrillation Mother    Congestive Heart Failure Father        Home Medications:   Prior to Admission medications  Medication Sig Start Date End Date Taking? Authorizing Provider  acetaminophen  (TYLENOL ) 500 MG tablet Take 2 tablets (1,000 mg total) by mouth every 6 (six) hours as needed for mild pain (pain score 1-3). 09/13/23  Yes Will Almarie MATSU, MD  albuterol  (VENTOLIN  HFA) 108 [phone] Base) MCG/ACT inhaler Inhale 2 puffs into the lungs every 6 (six) hours as needed for wheezing/shortness of breath. 11/06/23  Yes   amphetamine -dextroamphetamine  (ADDERALL XR) 20 MG 24 hr capsule Take 1 capsule (20 mg total) by mouth daily. 04/05/24 04/05/24  Yes   busPIRone  (BUSPAR ) 15 MG tablet Take 1 tablet (15 mg total) by mouth 3 (three) times daily. 11/08/23  Yes   cholecalciferol  (VITAMIN D3) 25 MCG (1000 UNIT) tablet Take 1,000 Units by mouth daily.   Yes [provider]  doxepin  (SINEQUAN ) 50 MG capsule Take 1 capsule (50 mg total) by mouth at bedtime. 02/06/24  Yes   DULoxetine  (CYMBALTA ) 60 MG capsule Take 2 capsules (120 mg total) by mouth daily. 08/13/23  Yes   fluticasone  (FLONASE ) 50 MCG/ACT nasal spray Place 2 sprays into both nostrils daily. Patient taking differently: Place 2 sprays into both nostrils daily as needed for allergies. 02/20/22  Yes Pray, Rollene BRAVO, MD  fluticasone -salmeterol (WIXELA INHUB ) 500-50 MCG/ACT AEPB Inhale 1 puff into the lungs 2 (two) times daily. Patient taking differently: Inhale 1 puff into the lungs daily. 11/06/23  Yes   furosemide  (LASIX ) 40 MG tablet Take 1 tablet (40 mg total) by  mouth daily. 11/06/23  Yes   gabapentin  (NEURONTIN ) 300 MG capsule Take 1 capsule (300 mg total) by mouth at bedtime. 11/06/23  Yes   ipratropium-albuterol  (DUONEB) 0.5-2.5 (3) MG/3ML SOLN Inhale 3 mLs into the lungs every 4 (four) hours as needed (Wheezing/SOB). 10/29/23  Yes [provider]  lisinopril  (ZESTRIL ) 20 MG tablet Take 1 tablet (20 mg total) by mouth daily. 11/08/23  Yes   mirtazapine  (REMERON ) 15 MG tablet Take 1 tablet (15 mg total) by mouth at bedtime. 02/06/24  Yes   montelukast  (SINGULAIR ) 10 MG tablet Take 1 tablet (10 mg total) by mouth at bedtime. 11/06/23  Yes   Multiple Vitamin (MULTIVITAMIN WITH MINERALS) TABS tablet Take 1 tablet by mouth daily.   Yes [provider]  oxyCODONE -acetaminophen  (PERCOCET/ROXICET) 5-325 MG tablet Take 1 tablet by mouth every 8 (eight) hours as needed for up to 5 days for severe pain (pain score 7-10). 03/15/24 03/20/24 Yes Myriam Fonda RAMAN, PA-C  OXYGEN Inhale 5 L into the lungs continuous.   Yes [provider]  pantoprazole  (PROTONIX ) 40 MG tablet Take 1 tablet (40 mg total) by mouth daily. 11/06/23  Yes   potassium chloride  SA (KLOR-CON  M) 20 MEQ tablet Take 1 tablet (20 mEq total) by mouth daily. 11/08/23  Yes   albuterol  (VENTOLIN  HFA) 108 (90 Base) MCG/ACT inhaler Inhale 2 puffs into the lungs every 6 (six) hours as needed for wheezing or shortness of breath. Patient not taking: Reported on 03/20/2024 09/13/23   Will Almarie MATSU, MD  amphetamine -dextroamphetamine  (ADDERALL XR) 20 MG 24 hr capsule Take 1 capsule (20 mg total) by mouth daily. 03/05/24 Patient not taking: Reported on 03/20/2024 03/05/24     busPIRone  (BUSPAR ) 15 MG tablet Take 1 tablet (15 mg total) by mouth 3 (three) times daily. Patient not taking:  Reported on 03/20/2024 02/06/24     desvenlafaxine  (PRISTIQ ) 50 MG 24 hr tablet Take 1 tablet (50 mg total) by mouth daily. Patient not taking: Reported on 03/20/2024 11/08/23     diclofenac  (VOLTAREN ) 50 MG EC  tablet Take 1 tablet (50 mg total) by mouth daily. Patient not taking: Reported on 03/20/2024 11/08/23     diclofenac  (VOLTAREN ) 75 MG EC tablet Take 1 tablet (75 mg total) by mouth 2 (two) times daily. Patient not taking: Reported on 03/20/2024 12/07/23   Jaycee Greig PARAS, NP  doxepin  (SINEQUAN ) 25 MG capsule Take 1 capsule (25 mg total) by mouth daily. Patient not taking: Reported on 03/20/2024 11/06/23     DULoxetine  (CYMBALTA ) 60 MG capsule Take 2 capsules (120 mg total) by mouth daily. Patient not taking: Reported on 03/20/2024 06/07/23     DULoxetine  (CYMBALTA ) 60 MG capsule Take 2 capsules (120 mg total) by mouth daily. Patient not taking: Reported on 03/20/2024 11/06/23     DULoxetine  (CYMBALTA ) 60 MG capsule Take 2 capsules (120 mg total) by mouth daily. 02/06/24     mirtazapine  (REMERON ) 15 MG tablet Take 1 tablet (15 mg total) by mouth at bedtime. Patient not taking: Reported on 03/20/2024 11/06/23     mirtazapine  (REMERON ) 15 MG tablet Take 1 tablet (15 mg total) by mouth at bedtime. Patient not taking: Reported on 03/20/2024 08/13/23     oxyCODONE  (OXY IR/ROXICODONE ) 5 MG immediate release tablet Take 1 tablet (5 mg total) by mouth every 6 (six) hours as needed for severe pain (pain score 7-10). Patient not taking: Reported on 03/20/2024 11/28/23   Donnajean Lynwood DEL, PA-C  potassium chloride  (KLOR-CON ) 10 MEQ tablet Take 1 tablet (10 mEq total) by mouth daily. Patient not taking: Reported on 03/20/2024 10/09/23   Vernon Ranks, MD  predniSONE  (DELTASONE ) 10 MG tablet Take 2 tablets (20 mg total) by mouth daily. Patient not taking: Reported on 03/20/2024 09/26/23   Zammit, Joseph, MD  QUEtiapine  (SEROQUEL ) 25 MG tablet Take 1 tablet (25 mg total) by mouth at bedtime. Patient not taking: Reported on 03/20/2024 03/07/24     traZODone  (DESYREL ) 50 MG tablet Take 1 tablet (50 mg total) by mouth at bedtime. Patient not taking: Reported on 03/20/2024 11/06/23        Allergies:    Allergies[1]    Physical Exam:   Vitals  Blood pressure 130/77, pulse 81, temperature 98 F (36.7 C), temperature source Oral, resp. rate 16, height 5' 8 (1.727 m), weight (!) 186.9 kg, SpO2 99%.   1. General Pleasant morbidly obese African-American female lying in hospital bed in no distress,  2. Normal affect and insight, Not Suicidal or Homicidal, Awake Alert,   3. No F.N deficits, ALL C.Nerves Intact, Strength 5/5 all 4 extremities, Sensation intact all 4 extremities, Plantars down going.  4. Ears and Eyes appear Normal, Conjunctivae clear, PERRLA. Moist Oral Mucosa.  5. Supple Neck, No JVD, No cervical lymphadenopathy appriciated, No Carotid Bruits.  6. Symmetrical Chest wall movement, Good air movement bilaterally, CTAB.  7. RRR, No Gallops, Rubs or Murmurs, No Parasternal Heave.  8. Positive Bowel Sounds, Abdomen Soft, No tenderness, No organomegaly appriciated,No rebound -guarding or rigidity.  9.  No Cyanosis, Normal Skin Turgor, No Skin Rash or Bruise.  10. Good muscle tone,  joints appear normal , no effusions, Normal ROM.  11. No Palpable Lymph Nodes in Neck or Axillae      Data Review:   Recent Labs  Lab 03/20/24 0343 03/20/24  0423  WBC  --  9.2  HGB 12.9 10.9*  HCT 38.0 36.7  PLT  --  287  MCV  --  96.6  MCH  --  28.7  MCHC  --  29.7*  RDW  --  13.0  LYMPHSABS  --  2.3  MONOABS  --  1.0  EOSABS  --  0.1  BASOSABS  --  0.1    Recent Labs  Lab 03/20/24 0343 03/20/24 0423  NA 138 138  K 4.0 4.0  CL 92* 94*  CO2  --  37*  ANIONGAP  --  7  GLUCOSE 101* 98  BUN 19 17  CREATININE 1.90* 1.61*  AST  --  15  ALT  --  7  ALKPHOS  --  88  BILITOT  --  0.4  ALBUMIN  --  4.2  CALCIUM  --  11.1*    Lab Results  Component Value Date   CHOL 212 (H) 06/25/2023   HDL 84 06/25/2023   LDLCALC 109 (H) 06/25/2023   TRIG 112 06/25/2023   CHOLHDL 2.5 06/25/2023    Recent Labs  Lab 03/20/24 0423  CALCIUM 11.1*    Recent Labs  Lab 03/20/24 0343  03/20/24 0423  WBC  --  9.2  PLT  --  287  CREATININE 1.90* 1.61*    Urinalysis    Component Value Date/Time   COLORURINE YELLOW 03/20/2024 0423   APPEARANCEUR CLOUDY (A) 03/20/2024 0423   LABSPEC 1.025 03/20/2024 0423   PHURINE 5.5 03/20/2024 0423   GLUCOSEU NEGATIVE 03/20/2024 0423   HGBUR NEGATIVE 03/20/2024 0423   BILIRUBINUR SMALL (A) 03/20/2024 0423   KETONESUR NEGATIVE 03/20/2024 0423   PROTEINUR NEGATIVE 03/20/2024 0423   NITRITE NEGATIVE 03/20/2024 0423   LEUKOCYTESUR NEGATIVE 03/20/2024 0423      Imaging Results:   CT of L-spine on 03/15/2024.  IMPRESSION: 1. No acute osseous abnormality. 2. 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 Recommend correlation with radiographs prior to any operative intervention. 3. 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 Up to moderate associated multifactorial spinal stenosis , moderate to severe L4 neural foraminal stenosis.    Assessment & Plan:   Acute on chronic low back pain, acute on chronic right hip and knee pain.  Ongoing for several months to years.  With gradual worsening.  She follows with [facility] with Dr. Sherida, no fever or chills, no weakness in lower extremities, no loss of sensation, no bowel or bladder incontinence.  Recent CT of the L-spine done less than a week ago shows L2-L3 and L4-L5 spine degeneration with some spinal stenosis and foraminal stenosis at L4.  She has no alarming symptoms, for now supportive care, continue Neurontin , Cymbalta  which she takes at home, add tramadol , due to her severe morbid obesity, OSA OHS will minimize narcotics, patient was strictly counseled.  PT eval, have requested [facility] Dr. Burnetta to evaluate the patient as well.  Do not think she needs surgical evaluation.  If improved likely discharge in 1 to 2 days.  Chronic diastolic CHF.  EF 5% on recent echocardiogram-mildly dehydrated, skip  home diuretics today gentle hydration with LR for 12 hours.  Monitor.   Acute kidney injury on CKD stage IIIa-patient's creatinine baseline appears to be close to 1.5, kindly see above, AKI is very minimal, check urine electrolytes as well.    Hypotension: Blood pressure stable, for now hold ACE/ARB due to mild AKI, as needed hydralazine   Morbid obesity BMI of greater than 60 with underlying OSA OHS.  Daytime 5 L nasal cannula oxygen, nighttime BiPAP, minimize narcotics and benzodiazepines.   Chronic normocytic anemia: Hemoglobin stable.  Monitor.   Depression/anxiety: Resume home medications.     DVT Prophylaxis Heparin     AM Labs Ordered, also please review Full Orders  Family Communication: Admission, patients condition and plan of care including tests being ordered have been discussed with the patient and who indicates understanding and agree with the plan and Code Status.  Code Status full code  Likely DC to to be decided likely home  Condition fair  Consults called: Orthopedics if manage  Admission status: Observation  Time spent in minutes : 35  Signature  -    Lavada Stank M.D on 03/20/2024 at 9:30 AM   -  To page go to [email or web address]            [1]  Allergies Allergen Reactions   Sulfa Antibiotics Nausea And Vomiting and Other (See Comments)    Stomach pain

## 2024-03-20 NOTE — TOC Progression Note (Signed)
 Transition of Care [facility]) - Progression Note    Patient Details  Name: Janice Ryan MRN: [id number] Date of Birth: March 08, 1956  [facility] CM/SW Contact  Lauraine FORBES Saa, LCSWA Phone Number: 03/20/2024, 3:49 PM  Clinical Narrative:     3:49 PM CSW introduced self and role to patient. Patient confirmed that she resides at home with son where she receives [facility] aide three times a week through [employer]. Patient confirmed SNF history with [facility]. Patient confirmed that she has DME (wheelchair, rollator) history. Patient informed CSW that she uses payor benefit to attend appointments but confirmed SDOH (transportation) needs. CSW provided SDOH (transportation) resources. Per chart review, patient has a PCP and insurance. No TOC needs identified at this time. TOC will continue to follow.  Expected Discharge Plan: Home w Home Health Services Barriers to Discharge: Continued Medical Work up               Expected Discharge Plan and Services       Living arrangements for the past 2 months: Single Family Home                                       Social Drivers of Health (SDOH) Interventions SDOH Screenings   Food Insecurity: No Food Insecurity (03/20/2024)  Housing: Low Risk (03/20/2024)  Transportation Needs: Unmet Transportation Needs (03/20/2024)  Utilities: Not At Risk (03/20/2024)  Depression (PHQ2-9): Low Risk (07/06/2021)  Social Connections: Socially Integrated (03/20/2024)  Tobacco Use: Low Risk (03/15/2024)    Readmission Risk Interventions    10/09/2023   10:24 AM 09/27/2023    2:36 PM 03/06/2023   11:10 AM  Readmission Risk Prevention Plan  Transportation Screening Complete Complete Complete  PCP or Specialist Appt within 5-7 Days  Complete Complete  PCP or Specialist Appt within 3-5 Days Complete    Home Care Screening  Complete Complete  Medication Review (RN CM)  Complete Complete  HRI or Home Care Consult Complete     Social Work Consult for Recovery Care Planning/Counseling Complete    Palliative Care Screening Not Applicable    Medication Review Oceanographer) Complete

## 2024-03-20 NOTE — ED Triage Notes (Addendum)
 Pt BIB EMS from home, c/o lower back pain that radiates to legs x4days. Reports pt was unable to make appointment yesterday d/t transportation issues, pt was recently seen for the same issue. Pt reports she is out of pain medication that she was prescribed from prior ED visit.  Pt wears 4L baseline, 91%, 95% on 5L [place] with EMS, pt 100% on 3.5 L [place] on arrival.   EMS VS 120 palp, HR 100, 95% 5L [place]

## 2024-03-20 NOTE — Discharge Instructions (Signed)
 [name]

## 2024-03-20 NOTE — Consult Note (Signed)
 ORTHOPAEDIC CONSULTATION  REQUESTING PHYSICIAN: Dennise Lavada POUR, MD  PCP:  Jaycee Greig PARAS, NP  Chief Complaint: acute on chronic low back pain   HPI: Janice Ryan is a 68 y.o. female who complains of  acute on chronic low back pain that has been progressing over the last 6 months. Patient reports radicular right leg pain in the S1 dermatome distrubution. She reports that this is constant. She is currently is taking gabapentin , tylenol  , and NSAIDS for her pain. She is not currently in pain management. She has not had any recent PT on the lumbar apine. She reports no recent injecton therapy or surgical intervention. She reports a prior hx of a lumbar surgery years ago but she is unsure what type of surgery at this time. She reports that the location of her pain is in the low back and radiates down the right leg. She describes the pain as sharp and achy. She reports that ROM and standing aggrevates her pain. She denies any alleviating factors other than pain medications such as oxycodone . She denies any recent falls, she denies any bowel or bladder habit changes/ incontinence, she denies any weakness.    Past Medical History:  Diagnosis Date   Acute respiratory failure with hypoxia (HCC)    Asthma    Chronic hypercapnic respiratory failure (HCC)    Hypertension    Morbid obesity (HCC)    Obesity with alveolar hypoventilation and body mass index (BMI) of 40 or greater (HCC)    OSA (obstructive sleep apnea)    Past Surgical History:  Procedure Laterality Date   HERNIA REPAIR     JOINT REPLACEMENT Left    knee   Social History   Socioeconomic History   Marital status: Married    Spouse name: Not on file   Number of children: Not on file   Years of education: Not on file   Highest education level: Not on file  Occupational History   Not on file  Tobacco Use   Smoking status: Never   Smokeless tobacco: Never  Vaping Use   Vaping status: Never Used  Substance and  Sexual Activity   Alcohol  use: Never   Drug use: Never   Sexual activity: Not Currently  Other Topics Concern   Not on file  Social History Narrative   Not on file   Social Drivers of Health   Tobacco Use: Low Risk (03/15/2024)   Patient History    Smoking Tobacco Use: Never    Smokeless Tobacco Use: Never    Passive Exposure: Not on file  Financial Resource Strain: Not on file  Food Insecurity: No Food Insecurity (03/20/2024)   Epic    Worried About Programme Researcher, Broadcasting/film/video in the Last Year: Never true    Ran Out of Food in the Last Year: Never true  Transportation Needs: Unmet Transportation Needs (03/20/2024)   Epic    Lack of Transportation (Medical): Yes    Lack of Transportation (Non-Medical): Yes  Physical Activity: Not on file  Stress: Not on file  Social Connections: Socially Integrated (03/20/2024)   Social Connection and Isolation Panel    Frequency of Communication with Friends and Family: More than three times a week    Frequency of Social Gatherings with Friends and Family: Once a week    Attends Religious Services: More than 4 times per year    Active Member of [employer] or Organizations: Yes    Attends Banker Meetings: More  than 4 times per year    Marital Status: Married  Depression (PHQ2-9): Low Risk (07/06/2021)   Depression (PHQ2-9)    PHQ-2 Score: 0  Alcohol  Screen: Not on file  Housing: Low Risk (03/20/2024)   Epic    Unable to Pay for Housing in the Last Year: No    Number of Times Moved in the Last Year: 0    Homeless in the Last Year: No  Utilities: Not At Risk (03/20/2024)   Epic    Threatened with loss of utilities: No  Health Literacy: Not on file   Family History  Problem Relation Age of Onset   Atrial fibrillation Mother    Congestive Heart Failure Father    Allergies[1] Prior to Admission medications  Medication Sig Start Date End Date Taking? Authorizing Provider  acetaminophen  (TYLENOL ) 500 MG tablet Take 2 tablets (1,000 mg  total) by mouth every 6 (six) hours as needed for mild pain (pain score 1-3). 09/13/23  Yes Will Almarie MATSU, MD  albuterol  (VENTOLIN  HFA) [phone] Base) MCG/ACT inhaler Inhale 2 puffs into the lungs every 6 (six) hours as needed for wheezing/shortness of breath. 11/06/23  Yes   amphetamine -dextroamphetamine  (ADDERALL XR) 20 MG 24 hr capsule Take 1 capsule (20 mg total) by mouth daily. 04/05/24 04/05/24  Yes   busPIRone  (BUSPAR ) 15 MG tablet Take 1 tablet (15 mg total) by mouth 3 (three) times daily. 11/08/23  Yes   cholecalciferol  (VITAMIN D3) 25 MCG (1000 UNIT) tablet Take 1,000 Units by mouth daily.   Yes [provider]  doxepin  (SINEQUAN ) 50 MG capsule Take 1 capsule (50 mg total) by mouth at bedtime. 02/06/24  Yes   fluticasone  (FLONASE ) 50 MCG/ACT nasal spray Place 2 sprays into both nostrils daily. Patient taking differently: Place 2 sprays into both nostrils daily as needed for allergies. 02/20/22  Yes Pray, Rollene BRAVO, MD  fluticasone -salmeterol (WIXELA INHUB ) 500-50 MCG/ACT AEPB Inhale 1 puff into the lungs 2 (two) times daily. Patient taking differently: Inhale 1 puff into the lungs daily. 11/06/23  Yes   furosemide  (LASIX ) 40 MG tablet Take 1 tablet (40 mg total) by mouth daily. 11/06/23  Yes   gabapentin  (NEURONTIN ) 300 MG capsule Take 1 capsule (300 mg total) by mouth at bedtime. 11/06/23  Yes   ipratropium-albuterol  (DUONEB) 0.5-2.5 (3) MG/3ML SOLN Inhale 3 mLs into the lungs every 4 (four) hours as needed (Wheezing/SOB). 10/29/23  Yes [provider]  lisinopril  (ZESTRIL ) 20 MG tablet Take 1 tablet (20 mg total) by mouth daily. 11/08/23  Yes   mirtazapine  (REMERON ) 15 MG tablet Take 1 tablet (15 mg total) by mouth at bedtime. 02/06/24  Yes   montelukast  (SINGULAIR ) 10 MG tablet Take 1 tablet (10 mg total) by mouth at bedtime. 11/06/23  Yes   Multiple Vitamin (MULTIVITAMIN WITH MINERALS) TABS tablet Take 1 tablet by mouth daily.   Yes [provider]   oxyCODONE -acetaminophen  (PERCOCET/ROXICET) 5-325 MG tablet Take 1 tablet by mouth every 8 (eight) hours as needed for up to 5 days for severe pain (pain score 7-10). 03/15/24 03/20/24 Yes Myriam Fonda RAMAN, PA-C  OXYGEN Inhale 5 L into the lungs continuous.   Yes [provider]  pantoprazole  (PROTONIX ) 40 MG tablet Take 1 tablet (40 mg total) by mouth daily. 11/06/23  Yes   potassium chloride  SA (KLOR-CON  M) 20 MEQ tablet Take 1 tablet (20 mEq total) by mouth daily. 11/08/23  Yes   desvenlafaxine  (PRISTIQ ) 50 MG 24 hr tablet Take 1  tablet (50 mg total) by mouth daily. Patient not taking: Reported on 03/20/2024 11/08/23     diclofenac  (VOLTAREN ) 50 MG EC tablet Take 1 tablet (50 mg total) by mouth daily. Patient not taking: Reported on 03/20/2024 11/08/23     DULoxetine  (CYMBALTA ) 60 MG capsule Take 2 capsules (120 mg total) by mouth daily. 02/06/24     traZODone  (DESYREL ) 50 MG tablet Take 1 tablet (50 mg total) by mouth at bedtime. Patient not taking: Reported on 03/20/2024 11/06/23      No results found.  Positive ROS: All other systems have been reviewed and were otherwise negative with the exception of those mentioned in the HPI and as above.  Physical Exam: General: Alert, no acute distress Cardiovascular: No pedal edema Respiratory: No cyanosis, no use of accessory musculature GI: No organomegaly, abdomen is soft and non-tender Skin: No lesions in the area of chief complaint Neurologic: Sensation intact distally Psychiatric: Patient is competent for consent with normal mood and affect Lymphatic: No axillary or cervical lymphadenopathy  MUSCULOSKELETAL: 5/5 strength in the bilateral LE, negative babinski sign, negative SLR bilaterally. ROM of the lumbar spine unable to be assessed due to patients mobility and fatigue. Patient reports positive dysesthesias in the right LE in the S1 dermatome distribution  MRI from 09/2023 shows  CLINICAL DATA:  Myelopathy, acute, lumbar spine. Fall  yesterday. Pain and numbness in the low back and legs. Right hip pain.   EXAM: MRI LUMBAR SPINE WITHOUT CONTRAST   TECHNIQUE: Multiplanar, multisequence MR imaging of the lumbar spine was performed. No intravenous contrast was administered.   COMPARISON:  CT lumbar spine 09/10/2023   FINDINGS: The study is motion degraded, including severe motion on the axial T2 sequence.   Segmentation: 4 lumbar vertebrae with completely sacralized L5.   Alignment:  Exaggerated lumbar lordosis.  No listhesis.   Vertebrae: No fracture or suspicious marrow lesion. Mild left facet edema at L3-4, degenerative in appearance. Large hemangioma involving nearly the entirety of the L3 vertebral body. Small hemangioma at T11.   Conus medullaris and cauda equina: Conus extends to the T12-L1 level. Conus and cauda equina appear normal.   Paraspinal and other soft tissues: Unremarkable.   Disc levels:   T12-L1 and L1-2: Negative.   L2-3: Disc desiccation and moderate disc space narrowing. Disc bulging and severe facet and ligamentum flavum hypertrophy result in mild-to-moderate spinal stenosis and mild bilateral neural foraminal stenosis.   L3-4: Disc desiccation and mild disc space narrowing. Left eccentric disc bulging and severe left greater than right facet and ligamentum flavum hypertrophy result in mild-to-moderate spinal stenosis and mild-to-moderate left neural foraminal stenosis.   L4-5: Disc desiccation and moderate disc space narrowing. Disc bulging and severe facet and ligamentum flavum hypertrophy result in mild-to-moderate spinal stenosis and severe right and moderate to severe left neural foraminal stenosis.   L5-S1: Transitional anatomy with rudimentary disc.  No stenosis.   IMPRESSION: 1. Transitional lumbosacral anatomy. 2. No acute osseous abnormality. 3. Severe lower lumbar facet arthrosis. 4. 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 5. Severe neural  foraminal stenosis at L4-5.    Assessment: Janice Ryan is a very pleasant 68 year old female who presents today for acute on chronic low back pain. She has had prior surgery on the lumbar spine but is unsure of what type. Over the last 6 months she has had increased pain and radicular right leg pain in the S1 dermatome distribution that is intermittent. She currently takes gabapentin , tylenol , and  ibuprofen  for her pain. She ha snot had any recent PT/OT, injections, or pain mgmt. She is fortunately neurologically intact and not myelopathic and has no s/sx of cauda equina syndrome as she denies bowel/bladder incontinence. MRI imaging from 09/2023 shows severe lower lumbar facet arthrosis and multi-level spinal and foraminal stenosis    Plan: At this time we do not believe that anything can be done surgically for Druanne due to morbid obesity and her being without focal neurological deficits. Therefore we are recommending outpatient conservative treatment with PT, aquatic therapy, and pain medical management that could consist of medication options and injection therapy if warranted. We appreciate the consult and the ability to help care for this patient.     Jeoffrey LOISE Sages, PA-C for Dr. Donaciano Sprang, MD Cell [phone]   03/20/2024 6:31 PM      [1]  Allergies Allergen Reactions   Sulfa Antibiotics Nausea And Vomiting and Other (See Comments)    Stomach pain

## 2024-03-20 NOTE — Progress Notes (Signed)
° ° °  EXPEDITER LEVEL LOADING ASSESSMENT NOTE  Patient Name: Janice Ryan  DOB:30-Jun-1955 Date of Admission: 03/20/2024  Date of Assessment:03/20/2024   -------------------------------------------------------------------------------------------------------------------   Brief clinical summary:  68 y.o. female presents for persistent lower back pain with intermittent radiation to the right leg.   Is there Bed Availability at another [facility]? Yes  If yes, what facility: [facility]  Level of Care Needed:  Yes  MD Agree to transfer: N/A  Patient agrees to transfer: No    -------------------------------------------------------------------------------------------------------------------  [facility] RN Expediter, Ikenna Ohms S Jamari Diana Please contact us  directly via secure chat (search for [place]) or by calling us  at [phone] [facility]).

## 2024-03-20 NOTE — Progress Notes (Signed)
 OT Cancellation Note  Patient Details Name: Janice Ryan MRN: [id number] DOB: [birth year]   Cancelled Treatment:    Reason Eval/Treat Not Completed: Other (comment). Pt reports she is worn out, pain not well enough controlled and she wants to finish her lunch. Will re-attempt therapy evals tomorrow.  Donny BECKER OT Acute Rehabilitation Services Office [phone]    Rodgers Dorothyann Distel 03/20/2024, 3:09 PM

## 2024-03-20 NOTE — Plan of Care (Signed)

## 2024-03-20 NOTE — Progress Notes (Incomplete)
 TRH night cross cover note:   I was notified by the patient's RN  ***  charge RN, and the patient had orders for BIPAP at night. We do not do BIPAP on this floor. So possibly need to transfer? She is currently on 4 L o2 and 5L is her baseline O2 at home ***  Transfer order placed to med tele.    Eva Pore, DO Hospitalist

## 2024-03-21 ENCOUNTER — Other Ambulatory Visit (HOSPITAL_COMMUNITY): Payer: Self-pay

## 2024-03-21 DIAGNOSIS — N179 Acute kidney failure, unspecified: Secondary | ICD-10-CM | POA: Diagnosis not present

## 2024-03-21 LAB — CBC WITH DIFFERENTIAL/PLATELET
Abs Immature Granulocytes: 0.05 K/uL (ref 0.00–0.07)
Basophils Absolute: 0 K/uL (ref 0.0–0.1)
Basophils Relative: 1 %
Eosinophils Absolute: 0.1 K/uL (ref 0.0–0.5)
Eosinophils Relative: 2 %
HCT: 33 % — ABNORMAL LOW (ref 36.0–46.0)
Hemoglobin: 9.8 g/dL — ABNORMAL LOW (ref 12.0–15.0)
Immature Granulocytes: 1 %
Lymphocytes Relative: 27 %
Lymphs Abs: 2.1 K/uL (ref 0.7–4.0)
MCH: 28.7 pg (ref 26.0–34.0)
MCHC: 29.7 g/dL — ABNORMAL LOW (ref 30.0–36.0)
MCV: 96.5 fL (ref 80.0–100.0)
Monocytes Absolute: 0.9 K/uL (ref 0.1–1.0)
Monocytes Relative: 11 %
Neutro Abs: 4.6 K/uL (ref 1.7–7.7)
Neutrophils Relative %: 58 %
Platelets: 274 K/uL (ref 150–400)
RBC: 3.42 MIL/uL — ABNORMAL LOW (ref 3.87–5.11)
RDW: 13 % (ref 11.5–15.5)
WBC: 7.8 K/uL (ref 4.0–10.5)
nRBC: 0 % (ref 0.0–0.2)

## 2024-03-21 LAB — BASIC METABOLIC PANEL WITH GFR
Anion gap: 6 (ref 5–15)
BUN: 26 mg/dL — ABNORMAL HIGH (ref 8–23)
CO2: 36 mmol/L — ABNORMAL HIGH (ref 22–32)
Calcium: 10.2 mg/dL (ref 8.9–10.3)
Chloride: 96 mmol/L — ABNORMAL LOW (ref 98–111)
Creatinine, Ser: 1.95 mg/dL — ABNORMAL HIGH (ref 0.44–1.00)
GFR, Estimated: 28 mL/min — ABNORMAL LOW
Glucose, Bld: 110 mg/dL — ABNORMAL HIGH (ref 70–99)
Potassium: 4.5 mmol/L (ref 3.5–5.1)
Sodium: 138 mmol/L (ref 135–145)

## 2024-03-21 LAB — MAGNESIUM: Magnesium: 2.1 mg/dL (ref 1.7–2.4)

## 2024-03-21 MED ORDER — ORAL CARE MOUTH RINSE: 15.0000 mL | OROMUCOSAL | Status: DC | PRN

## 2024-03-21 MED ORDER — LIDOCAINE 5 % EX PTCH
1.0000 | MEDICATED_PATCH | CUTANEOUS | Status: DC
  Administered 2024-03-21 – 2024-03-25 (×5): 1 via TRANSDERMAL
  Filled 2024-03-21 (×5): qty 1

## 2024-03-21 MED ORDER — SODIUM CHLORIDE 0.9 % IV SOLN: INTRAVENOUS | Status: DC

## 2024-03-21 MED ORDER — FLUTICASONE PROPIONATE 50 MCG/ACT NA SUSP
1.0000 | Freq: Every day | NASAL | Status: DC
  Administered 2024-03-21 – 2024-03-26 (×7): 1 via NASAL
  Filled 2024-03-21: qty 16

## 2024-03-21 NOTE — TOC Progression Note (Signed)
 Transition of Care [facility]) - Progression Note    Patient Details  Name: Janice Ryan MRN: [id number] Date of Birth: 1955-12-04  [facility]) CM/SW Contact  Isaiah Public, LCSWA Phone Number: 03/21/2024, 11:12 AM  Clinical Narrative:     CSW received consult for possible SNF placement at time of discharge. CSW spoke with patient at bedside regarding PT recommendation of SNF placement at time of discharge. Patient reports she lives at  home with son and spouse. Patient expressed understanding of PT recommendation and reports she would like to return home and resume HH when medically ready for dc. Patient reports that [facility] comes out 3 days a week for 2 hours and would like to see if those hours can be extended. Patient reports she does good with rollator.Patient confirmed that she has DME (wheelchair, rollator) history. Patient reports she uses a Bipap at night. Patient reports she has support from her son at home. CM to follow up with patient to discuss home needs. All questions answered. No further questions reported at this time. CSW to continue to follow and assist with discharge planning needs.   Expected Discharge Plan: Home w Home Health Services Barriers to Discharge: Continued Medical Work up               Expected Discharge Plan and Services       Living arrangements for the past 2 months: Single Family Home                                       Social Drivers of Health (SDOH) Interventions SDOH Screenings   Food Insecurity: No Food Insecurity (03/20/2024)  Housing: Low Risk (03/20/2024)  Transportation Needs: Unmet Transportation Needs (03/20/2024)  Utilities: Not At Risk (03/20/2024)  Depression (PHQ2-9): Low Risk (07/06/2021)  Social Connections: Socially Integrated (03/20/2024)  Tobacco Use: Low Risk (03/15/2024)    Readmission Risk Interventions    10/09/2023   10:24 AM 09/27/2023    2:36 PM 03/06/2023   11:10 AM  Readmission  Risk Prevention Plan  Transportation Screening Complete Complete Complete  PCP or Specialist Appt within 5-7 Days  Complete Complete  PCP or Specialist Appt within 3-5 Days Complete    Home Care Screening  Complete Complete  Medication Review (RN CM)  Complete Complete  HRI or Home Care Consult Complete    Social Work Consult for Recovery Care Planning/Counseling Complete    Palliative Care Screening Not Applicable    Medication Review Oceanographer) Complete

## 2024-03-21 NOTE — Care Management Obs Status (Signed)
 MEDICARE OBSERVATION STATUS NOTIFICATION   Patient Details  Name: Janice Ryan MRN: [id number] Date of Birth: 1956-03-18   Medicare Observation Status Notification Given:  Yes    Vonzell Arrie Sharps 03/21/2024, 2:10 PM

## 2024-03-21 NOTE — Progress Notes (Signed)
 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  Goals of Care:   Code Status: Full Code   DVT prophylaxis: heparin  injection 5,000 Units Start: 03/20/24 1000   Interim Hx: The patient was unable to work with PT/OT yesterday due to her pain.  Afebrile.  Vital signs stable.  She reports some intermittent twitching movements of her arms today.  She denies any new complaints.  She reports that her back pain has slightly improved.  Assessment & Plan:  Acute on chronic low back right hip and right knee pain Chronic pain has been present for months to years now but has gradually been worsening -followed by Dr. Lizette at [facility] -no worrisome findings such as loss of sensation worsening weakness or loss of bowel or bladder continence -CTa L-spine obtained less than a week ago noted L2-L3 and L4-L5 spine degeneration with some spinal stenosis and foraminal stenosis at L4 - avoiding narcotics due to high risk of narcotic induced hypoventilation  Chronic diastolic CHF Well compensated at the present -was actually mildly dehydrated at presentation  Acute kidney injury on CKD stage IIIa Baseline creatinine approximately 1.5 -continue to hydrate and follow trend  HTN BP stable at present  Chronic normocytic anemia of CKD Hemoglobin stable  Depression/anxiety Continue usual home meds  Morbid obesity (Body mass index is 62.64 kg/m.) - OHS with Sleep Apnea Requires BiPAP use q. night and  5 L nasal cannula support during the day -at exceedingly high risk for respiratory compromise with use of narcotics or other sedating medications   Family Communication: No family present at time of exam Disposition: Will depend upon performance with PT/OT as well as renal function   Objective: Blood pressure (!) 100/58, pulse 64, temperature 98.4 F (36.9 C), temperature source Axillary, resp. rate (!) 22, height 5' 8 (1.727 m), weight (!) 186.9 kg, SpO2 97%.  Intake/Output Summary (Last 24 hours) at 03/21/2024 0948 Last data filed at 03/21/2024 0120 Gross per 24 hour  Intake 240 ml  Output 300 ml  Net -60 ml   Filed Weights   03/20/24 0257  Weight: (!) 186.9 kg    Examination: General: No acute respiratory distress Lungs: Clear to auscultation bilaterally without wheezes or crackles Cardiovascular: Regular rate and rhythm without murmur gallop or rub normal S1 and S2 Abdomen: Morbidly obese  - nontender, soft, bowel sounds positive, no rebound Extremities: 1+ chronic appearing bilateral lower extremity edema  CBC: Recent Labs  Lab 03/20/24 0343 03/20/24 0423 03/21/24 0535  WBC  --  9.2 7.8  NEUTROABS  --  5.8 4.6  HGB 12.9 10.9* 9.8*  HCT 38.0 36.7 33.0*  MCV  --  96.6 96.5  PLT  --  287 274   Basic Metabolic Panel: Recent Labs  Lab 03/20/24 0343 03/20/24 0423 03/21/24 0535  NA 138 138 138  K 4.0 4.0 4.5  CL 92* 94* 96*  CO2  --  37* 36*  GLUCOSE 101* 98 110*  BUN 19 17 26*  CREATININE  1.90* 1.61* 1.95*  CALCIUM  --  11.1* 10.2  MG  --   --  2.1   GFR: Estimated Creatinine Clearance: 50.7 mL/min (A) (by C-G formula based on SCr of 1.95 mg/dL (H)).   Scheduled Meds:  doxepin   50 mg Oral QHS   DULoxetine   120 mg Oral Daily   fluticasone   1 spray Each Nare Daily   gabapentin   300 mg Oral BID   heparin   5,000 Units Subcutaneous Q8H   mirtazapine   15 mg Oral QHS   pantoprazole   40 mg Oral Daily   sodium chloride  flush  3 mL Intravenous Q12H    venlafaxine  XR  75 mg Oral Q breakfast     LOS: 0 days   Reyes IVAR Moores, MD [facility] Office  [phone] Pager - Text Page per Tracey  If 7PM-7AM, please contact night-coverage per Amion 03/21/2024, 9:48 AM     "

## 2024-03-21 NOTE — Progress Notes (Signed)
 TRH night cross cover note:   Per patient's request, I have resumed home flonase , first doses now.    Eva Pore, DO Hospitalist

## 2024-03-21 NOTE — Evaluation (Signed)
 Physical Therapy Evaluation Patient Details Name: Janice Ryan MRN: [id number] DOB: 12/22/1955 Today's Date: 03/21/2024  History of Present Illness  68 y.o. female admitted 03/20/24 with worsening low back pain, radiating into RLE. Recent lumbar CT 03/15/24 showed chronic severe degeneration L2-L5, multifactorial spinal stenosis, moderate to severe L4 neural foraminal stenosis. PMH includes chronic pain, HTN, CKD, CHF, OHS (5L O2 [place]), OSA on BiPAP, obesity.   Clinical Impression  PTA pt had assist from PCA for 2hrs/3 days a week to assist with bathing/dressing. Family would assist with cooking, cleaning, and occasionally for bed mobility. Pt would ambulate short distances with rollator and use WC for community mobility. Pt presents with back pain, generalized weakness (RLE>LLE), impaired balance/gait, and impaired cardiovascular system. Pt required ModA to MaxA for bed mobility. Once seated on EOB, pt preferred to have BUE support to stabilize. Attempted x2 stands with pt holding onto recliner handles, however, pt unable to reach upright posture with +1 assist. Discussed post-acute rehab with pt strongly desiring to return home. At this time recommending <3hrs post acute rehab to work towards prior level of independence and decrease caregiver burden. If pt decides to go home, pt would need 24/7 assist with DME listed below. Pt would also need PTAR to transport home. Acute PT to follow.       If plan is discharge home, recommend the following: A lot of help with walking and/or transfers;A lot of help with bathing/dressing/bathroom;Assist for transportation;Help with stairs or ramp for entrance;Assistance with cooking/housework   Can travel by private vehicle   No    Equipment Recommendations Hospital bed;Hoyer lift     Functional Status Assessment Patient has had a recent decline in their functional status and demonstrates the ability to make significant improvements in function in a  reasonable and predictable amount of time.     Precautions / Restrictions Precautions Precautions: Fall Recall of Precautions/Restrictions: Intact Restrictions Weight Bearing Restrictions Per Provider Order: No      Mobility  Bed Mobility Overal bed mobility: Needs Assistance Bed Mobility: Supine to Sit, Sit to Supine    Supine to sit: Max assist, HOB elevated, Used rails Sit to supine: Mod assist, Used rails   General bed mobility comments: assist to bring BLE's off EOB and to raise trunk. ModA to return to supine for BLE management. TotalAx2 needed to scoot towards HOB in trendelenburg    Transfers Overall transfer level: Needs assistance Equipment used:  (back of recliner) Transfers: Sit to/from Stand Sit to Stand: Total assist    General transfer comment: unable to stand fully upright with +1 assist holding onto recliner handles.    Ambulation/Gait  General Gait Details: unable     Balance Overall balance assessment: Needs assistance Sitting-balance support: Bilateral upper extremity supported, Feet supported Sitting balance-Leahy Scale: Fair Sitting balance - Comments: B UE support from bed rails   Standing balance support: Bilateral upper extremity supported, During functional activity, Reliant on assistive device for balance Standing balance-Leahy Scale: Zero Standing balance comment: unable to stand with +1       Pertinent Vitals/Pain Pain Assessment Pain Assessment: Faces Faces Pain Scale: Hurts even more Pain Location: low back Pain Descriptors / Indicators: Discomfort, Aching Pain Intervention(s): Limited activity within patient's tolerance, Monitored during session, Repositioned, RN gave pain meds during session    Home Living Family/patient expects to be discharged to:: Private residence Living Arrangements: Spouse/significant other;Children (spouse and son) Available Help at Discharge: Family;Available PRN/intermittently;Personal care attendant  (possibly 24/7) Type of  Home: Apartment Home Access: Level entry    Home Layout: One level Home Equipment: Rollator (4 wheels);Wheelchair - manual;Shower seat;BSC/3in1 Additional Comments: PCA for 3 days a week for 2 hours    Prior Function Prior Level of Function : Needs assist      Mobility Comments: Occasional assist with bed mobility. Short distances with rollator with WC for longer distances ADLs Comments: Assist for dressing/bathing. Family cooks/cleans     Extremity/Trunk Assessment   Upper Extremity Assessment Upper Extremity Assessment: Defer to OT evaluation    Lower Extremity Assessment Lower Extremity Assessment: Generalized weakness;RLE deficits/detail RLE Deficits / Details: R radicular symptoms, reports needing a knee replacement with torn ligaments (?ACL)    Cervical / Trunk Assessment Cervical / Trunk Assessment: Other exceptions Cervical / Trunk Exceptions: increased body habitus  Communication   Communication Communication: No apparent difficulties    Cognition Arousal: Alert Behavior During Therapy: WFL for tasks assessed/performed   PT - Cognitive impairments: No apparent impairments    Following commands: Intact       Cueing Cueing Techniques: Verbal cues, Tactile cues      PT Assessment Patient needs continued PT services  PT Problem List Decreased strength;Decreased range of motion;Decreased activity tolerance;Decreased balance;Decreased mobility;Obesity;Cardiopulmonary status limiting activity       PT Treatment Interventions DME instruction;Gait training;Functional mobility training;Therapeutic activities;Therapeutic exercise;Balance training;Neuromuscular re-education;Patient/family education;Wheelchair mobility training    PT Goals (Current goals can be found in the Care Plan section)  Acute Rehab PT Goals Patient Stated Goal: to go home PT Goal Formulation: With patient Time For Goal Achievement: 04/04/24 Potential to Achieve  Goals: Fair    Frequency Min 2X/week        AM-PAC PT 6 Clicks Mobility  Outcome Measure Help needed turning from your back to your side while in a flat bed without using bedrails?: A Lot Help needed moving from lying on your back to sitting on the side of a flat bed without using bedrails?: A Lot Help needed moving to and from a bed to a chair (including a wheelchair)?: Total Help needed standing up from a chair using your arms (e.g., wheelchair or bedside chair)?: Total Help needed to walk in hospital room?: Total Help needed climbing 3-5 steps with a railing? : Total 6 Click Score: 8    End of Session Equipment Utilized During Treatment: Gait belt Activity Tolerance: Patient limited by pain Patient left: in bed;with call bell/phone within reach Nurse Communication: Mobility status;Need for lift equipment PT Visit Diagnosis: Unsteadiness on feet (R26.81);Other abnormalities of gait and mobility (R26.89);Muscle weakness (generalized) (M62.81);History of falling (Z91.81)    Time: 9078-9043 PT Time Calculation (min) (ACUTE ONLY): 35 min   Charges:   PT Evaluation $PT Eval Moderate Complexity: 1 Mod PT Treatments $Therapeutic Activity: 8-22 mins PT General Charges $$ ACUTE PT VISIT: 1 Visit       Kate ORN, PT, DPT Secure Chat Preferred  Rehab Office [phone]   Kate BRAVO Wendolyn 03/21/2024, 10:10 AM

## 2024-03-21 NOTE — Progress Notes (Signed)
" ° ° ° °   03/21/24 0807  Vent Select  Invasive or Noninvasive Noninvasive  Adult Vent Y  Adult Ventilator Settings  Vent Type Servo-air  Vent Mode PCV  Set Rate 18 bmp  FiO2 (%) 60 %  I Time 0.9 Sec(s)  Pressure Control 16 cmH20  PEEP 6 cmH20  Adult Ventilator Measurements  Peak Airway Pressure 16 L/min  Mean Airway Pressure 9 cmH20  Resp Rate Spontaneous 22 br/min  Resp Rate Total 22 br/min  Exhaled Vt 683 mL  Spont TV 583 mL  Measured Ve 12.5 L  I:E Ratio Measured 1:2.6  Adult Ventilator Alarms  Alarms On Y  Ve High Alarm 24 L/min  Ve Low Alarm 4 L/min  Resp Rate High Alarm 38 br/min  Resp Rate Low Alarm 10  PEEP Low Alarm 13 cmH2O  Press High Alarm 30 cmH2O  VAP Prevention  HOB> 30 Degrees Y  Equipment wiped down Yes  Breath Sounds  Bilateral Breath Sounds Clear;Diminished    "

## 2024-03-21 NOTE — Evaluation (Signed)
 Occupational Therapy Evaluation Patient Details Name: Janice Ryan MRN: [id number] DOB: November 01, 1955 Today's Date: 03/21/2024   History of Present Illness   68 y.o. female admitted 03/20/24 with worsening low back pain, radiating into RLE. Recent lumbar CT 03/15/24 showed chronic severe degeneration L2-L5, multifactorial spinal stenosis, moderate to severe L4 neural foraminal stenosis. PMH includes chronic pain, HTN, CKD, CHF, OHS (5L O2 [place]), OSA on BiPAP, obesity.     Clinical Impressions Pt presents with decline and function with ADLs and ADL mobility with impaired strength, balance, endurance and coordination (jerking/twitching in B UEs/hands). PTA pt reports that she was Mod I with ADLs/selfcare and has a PCA for 2hrs/3 days a week to assist with home mgt tasks; when PCA not there, family would assist with cooking, cleaning, and occasionally for bed mobility. Pt used rollater short distances and  W/C for community mobility. Pt is limited by back pain. Pt required  Max A for bed mobility, unable to complete due to back pain increasing from 6/10 to 7/10. Pt requires et up/up with light grooming.hygiene at bed level, min A with UB ADLs at be level and total A with LB ADLs and toileting tasks. At this time, recommending post acute rehab. If pt decides to go home, will need HH therapies, 24/7 assist with DME recommended below. OT will follow aciyely to maximize level of function and safety   If plan is discharge home, recommend the following:   A lot of help with bathing/dressing/bathroom;Two people to help with walking and/or transfers;Assist for transportation;Help with stairs or ramp for entrance     Functional Status Assessment   Patient has had a recent decline in their functional status and demonstrates the ability to make significant improvements in function in a reasonable and predictable amount of time.     Equipment Recommendations   Wheelchair (measurements  OT);Wheelchair cushion (measurements OT);Hospital bed;Hoyer lift;Other (comment) [facility])     Recommendations for Other Services         Precautions/Restrictions   Precautions Precautions: Fall Recall of Precautions/Restrictions: Intact Restrictions Weight Bearing Restrictions Per Provider Order: No     Mobility Bed Mobility Overal bed mobility: Needs Assistance Bed Mobility: Supine to Sit, Sit to Supine     Supine to sit: Max assist, HOB elevated, Used rails     General bed mobility comments: max A to initiate LEs to EOB, max A to elevate trunk but unable to complete sitting to EOB due to back pain    Transfers                          Balance                                           ADL either performed or assessed with clinical judgement   ADL Overall ADL's : Needs assistance/impaired Eating/Feeding: Set up;Independent;Sitting;Bed level   Grooming: Wash/dry hands;Wash/dry face;Set up;Supervision/safety;Sitting;Bed level   Upper Body Bathing: Minimal assistance;Bed level   Lower Body Bathing: Total assistance   Upper Body Dressing : Minimal assistance;Bed level   Lower Body Dressing: Total assistance     Toilet Transfer Details (indicate cue type and reason): will need +2 assist Toileting- Clothing Manipulation and Hygiene: Total assistance;Bed level         General ADL Comments: pt limited by back pain  Vision Ability to See in Adequate Light: 0 Adequate Patient Visual Report: No change from baseline       Perception         Praxis         Pertinent Vitals/Pain Pain Assessment Pain Assessment: 0-10 Pain Score: 7  Pain Location: low back Pain Descriptors / Indicators: Discomfort, Aching Pain Intervention(s): Limited activity within patient's tolerance, Monitored during session, Repositioned, Premedicated before session     Extremity/Trunk Assessment Upper Extremity Assessment Upper Extremity  Assessment: Generalized weakness;Right hand dominant   Lower Extremity Assessment Lower Extremity Assessment: Defer to PT evaluation   Cervical / Trunk Assessment Cervical / Trunk Assessment: Other exceptions Cervical / Trunk Exceptions: increased body habitus   Communication Communication Communication: No apparent difficulties   Cognition Arousal: Alert Behavior During Therapy: WFL for tasks assessed/performed                                 Following commands: Intact       Cueing  General Comments   Cueing Techniques: Verbal cues      Exercises     Shoulder Instructions      Home Living Family/patient expects to be discharged to:: Private residence Living Arrangements: Spouse/significant other;Children Available Help at Discharge: Family;Available PRN/intermittently;Personal care attendant Type of Home: Apartment Home Access: Level entry     Home Layout: One level     Bathroom Shower/Tub: Tub/shower unit;Curtain   Bathroom Toilet: Handicapped height Bathroom Accessibility: Yes   Home Equipment: Rollator (4 wheels);Wheelchair - manual;Shower seat;BSC/3in1   Additional Comments: PCA for 3 days a week for 2 hours for home mgt per pt      Prior Functioning/Environment Prior Level of Function : Needs assist             Mobility Comments: Occasional assist with bed mobility. Short distances with rollator with WC for longer distances ADLs Comments: Assist for dressing/bathing. Family cooks/cleans    OT Problem List: Decreased strength;Decreased activity tolerance;Impaired balance (sitting and/or standing);Pain   OT Treatment/Interventions: Self-care/ADL training;Therapeutic exercise;Patient/family education;Therapeutic activities;DME and/or AE instruction      OT Goals(Current goals can be found in the care plan section)   Acute Rehab OT Goals Patient Stated Goal: go home OT Goal Formulation: With patient Time For Goal Achievement:  04/04/24 Potential to Achieve Goals: Good ADL Goals Pt Will Perform Grooming: with contact guard assist;with supervision;with set-up;sitting Pt Will Perform Upper Body Bathing: with contact guard assist;with supervision;sitting Pt Will Perform Lower Body Bathing: with max assist;with mod assist;sitting/lateral leans Pt Will Perform Upper Body Dressing: with contact guard assist;with supervision;sitting Pt Will Transfer to Toilet: with max assist;with mod assist;stand pivot transfer;bedside commode Pt Will Perform Toileting - Clothing Manipulation and hygiene: with max assist;with mod assist;sitting/lateral leans;sit to/from stand   OT Frequency:  Min 2X/week    Co-evaluation              AM-PAC OT 6 Clicks Daily Activity     Outcome Measure Help from another person eating meals?: None Help from another person taking care of personal grooming?: A Little Help from another person toileting, which includes using toliet, bedpan, or urinal?: Total Help from another person bathing (including washing, rinsing, drying)?: Total Help from another person to put on and taking off regular upper body clothing?: A Little Help from another person to put on and taking off regular lower body clothing?: Total 6 Click Score: 13  End of Session Nurse Communication: Mobility status  Activity Tolerance: Patient limited by pain Patient left: in bed;with call bell/phone within reach  OT Visit Diagnosis: Other abnormalities of gait and mobility (R26.89);Muscle weakness (generalized) (M62.81);Pain Pain - part of body:  (back)                Time: 8763-8698 OT Time Calculation (min): 25 min Charges:  OT General Charges $OT Visit: 1 Visit OT Evaluation $OT Eval Moderate Complexity: 1 Mod OT Treatments $Therapeutic Activity: 8-22 mins    Jacques Karna Loose 03/21/2024, 2:39 PM

## 2024-03-21 NOTE — Plan of Care (Signed)

## 2024-03-22 ENCOUNTER — Encounter (HOSPITAL_COMMUNITY): Payer: Self-pay | Admitting: Internal Medicine

## 2024-03-22 DIAGNOSIS — N179 Acute kidney failure, unspecified: Secondary | ICD-10-CM | POA: Diagnosis not present

## 2024-03-22 LAB — BASIC METABOLIC PANEL WITH GFR
Anion gap: 6 (ref 5–15)
BUN: 36 mg/dL — ABNORMAL HIGH (ref 8–23)
CO2: 34 mmol/L — ABNORMAL HIGH (ref 22–32)
Calcium: 9.6 mg/dL (ref 8.9–10.3)
Chloride: 96 mmol/L — ABNORMAL LOW (ref 98–111)
Creatinine, Ser: 2.32 mg/dL — ABNORMAL HIGH (ref 0.44–1.00)
GFR, Estimated: 23 mL/min — ABNORMAL LOW
Glucose, Bld: 106 mg/dL — ABNORMAL HIGH (ref 70–99)
Potassium: 4.6 mmol/L (ref 3.5–5.1)
Sodium: 136 mmol/L (ref 135–145)

## 2024-03-22 LAB — UREA NITROGEN, URINE: Urea Nitrogen, Ur: 495 mg/dL

## 2024-03-22 MED ORDER — METHOCARBAMOL 500 MG PO TABS
750.0000 mg | ORAL_TABLET | Freq: Three times a day (TID) | ORAL | Status: DC | PRN
  Administered 2024-03-22 – 2024-03-23 (×2): 750 mg via ORAL
  Filled 2024-03-22 (×2): qty 2

## 2024-03-22 MED ORDER — FUROSEMIDE 10 MG/ML IJ SOLN
40.0000 mg | Freq: Two times a day (BID) | INTRAMUSCULAR | Status: DC
  Administered 2024-03-22 (×2): 40 mg via INTRAVENOUS
  Filled 2024-03-22 (×3): qty 4

## 2024-03-22 NOTE — Progress Notes (Signed)
 RT NOTE: patient removed from bipap and placed on 4L Elim tolerating well at this time.  Will continue to monitor and assess for bipap needs.

## 2024-03-22 NOTE — Progress Notes (Signed)
 "  Janice Ryan  FMW:[id number] DOB: [birth year] DOA: 03/20/2024 PCP: Jaycee Greig PARAS, NP    Brief Narrative:  68 year old with a history of morbid obesity (BMI > 60), OSA on nightly BiPAP, obesity hypoventilation syndrome requiring 5 L nasal cannula during the day, chronic back pain, chronic right hip pain, chronic right knee pain, chronic diastolic CHF HTN, depression, and CKD stage IIIa with baseline creatinine approximately 1.5 who presented to the ER 12/18 with worsening of her chronic back pain despite ongoing use of her usual dose of Neurontin .  Goals of Care:   Code Status: Full Code   DVT prophylaxis: heparin  injection 5,000 Units Start: 03/20/24 1000   Interim Hx: No acute events reported overnight.  Afebrile.  Vital signs stable.  Creatinine worsening. Sleeping soundly at the time of my visit, despite reporting uncontrolled pain to RN earlier.   Assessment & Plan:  Acute on chronic low back right hip and right knee pain Chronic pain has been present for months to years now but has gradually been worsening - followed by Dr. Lizette at [facility] - no worrisome findings such as loss of sensation worsening weakness or loss of bowel or bladder continence - CTa L-spine obtained less than a week ago noted L2-L3 and L4-L5 spine degeneration with some spinal stenosis and foraminal stenosis at L4 - avoiding escalating dose of narcotics due to high risk of narcotic induced hypoventilation  Chronic diastolic CHF Well compensated at the present - resume diuretic today and follow   Acute kidney injury on CKD stage IIIa Baseline creatinine approximately 1.5 - crt worsening with hydration - stop IVF and diurese today - follow trend   HTN BP stable at present  Chronic normocytic anemia of CKD Hemoglobin stable  Depression/anxiety Continue usual home meds  Morbid obesity (Body mass index is 62.64 kg/m.) - OHS with Sleep Apnea Requires BiPAP use at bedtime and 5 L nasal cannula  support during the day -at exceedingly high risk for respiratory compromise with use of narcotics or other sedating medications   Family Communication: No family present at time of exam Disposition: Will depend upon performance with PT/OT as well as renal function Skilled Nursing-Short Term Rehab (<3 Hours/Day)03/21/2024 1003  Objective: Blood pressure (!) 127/50, pulse 69, temperature 97.8 F (36.6 C), temperature source Axillary, resp. rate 19, height 5' 8 (1.727 m), weight (!) 186.9 kg, SpO2 100%.  Intake/Output Summary (Last 24 hours) at 03/22/2024 0932 Last data filed at 03/22/2024 9273 Gross per 24 hour  Intake 2673.37 ml  Output 200 ml  Net 2473.37 ml   Filed Weights   03/20/24 0257  Weight: (!) 186.9 kg    Examination: General: No acute respiratory distress Lungs: Clear to auscultation bilaterally - breath sounds distant due to body habitus  Cardiovascular: Regular rate and rhythm - HS distant  Abdomen: Morbidly obese  - nontender, soft, bowel sounds positive, no rebound Extremities: 1+ chronic appearing B LE edema   CBC: Recent Labs  Lab 03/20/24 0343 03/20/24 0423 03/21/24 0535  WBC  --  9.2 7.8  NEUTROABS  --  5.8 4.6  HGB 12.9 10.9* 9.8*  HCT 38.0 36.7 33.0*  MCV  --  96.6 96.5  PLT  --  287 274   Basic Metabolic Panel: Recent Labs  Lab 03/20/24 0423 03/21/24 0535 03/22/24 0440  NA 138 138 136  K 4.0 4.5 4.6  CL 94* 96* 96*  CO2 37* 36* 34*  GLUCOSE 98 110* 106*  BUN 17 26*  36*  CREATININE 1.61* 1.95* 2.32*  CALCIUM 11.1* 10.2 9.6  MG  --  2.1  --    GFR: Estimated Creatinine Clearance: 42.6 mL/min (A) (by C-G formula based on SCr of 2.32 mg/dL (H)).   Scheduled Meds:  doxepin   50 mg Oral QHS   DULoxetine   120 mg Oral Daily   fluticasone   1 spray Each Nare Daily   gabapentin   300 mg Oral BID   heparin   5,000 Units Subcutaneous Q8H   lidocaine   1 patch Transdermal Q24H   mirtazapine   15 mg Oral QHS   pantoprazole   40 mg Oral Daily    sodium chloride  flush  3 mL Intravenous Q12H   venlafaxine  XR  75 mg Oral Q breakfast     LOS: 0 days   Reyes IVAR Moores, MD [facility] Office  [phone] Pager - Text Page per Tracey  If 7PM-7AM, please contact night-coverage per Amion 03/22/2024, 9:32 AM     "

## 2024-03-22 NOTE — Plan of Care (Signed)

## 2024-03-22 NOTE — Plan of Care (Signed)
  Problem: Education: Goal: Knowledge of General Education information will improve Description: Including pain rating scale, medication(s)/side effects and non-pharmacologic comfort measures Outcome: Progressing   Problem: Health Behavior/Discharge Planning: Goal: Ability to manage health-related needs will improve Outcome: Progressing   Problem: Elimination: Goal: Will not experience complications related to bowel motility Outcome: Progressing Goal: Will not experience complications related to urinary retention Outcome: Progressing

## 2024-03-23 DIAGNOSIS — N179 Acute kidney failure, unspecified: Secondary | ICD-10-CM | POA: Diagnosis not present

## 2024-03-23 LAB — BASIC METABOLIC PANEL WITH GFR
Anion gap: 5 (ref 5–15)
BUN: 39 mg/dL — ABNORMAL HIGH (ref 8–23)
CO2: 37 mmol/L — ABNORMAL HIGH (ref 22–32)
Calcium: 10 mg/dL (ref 8.9–10.3)
Chloride: 95 mmol/L — ABNORMAL LOW (ref 98–111)
Creatinine, Ser: 1.71 mg/dL — ABNORMAL HIGH (ref 0.44–1.00)
GFR, Estimated: 32 mL/min — ABNORMAL LOW
Glucose, Bld: 96 mg/dL (ref 70–99)
Potassium: 5 mmol/L (ref 3.5–5.1)
Sodium: 137 mmol/L (ref 135–145)

## 2024-03-23 LAB — CBC
HCT: 32.7 % — ABNORMAL LOW (ref 36.0–46.0)
Hemoglobin: 9.9 g/dL — ABNORMAL LOW (ref 12.0–15.0)
MCH: 29.3 pg (ref 26.0–34.0)
MCHC: 30.3 g/dL (ref 30.0–36.0)
MCV: 96.7 fL (ref 80.0–100.0)
Platelets: 258 K/uL (ref 150–400)
RBC: 3.38 MIL/uL — ABNORMAL LOW (ref 3.87–5.11)
RDW: 12.9 % (ref 11.5–15.5)
WBC: 8.9 K/uL (ref 4.0–10.5)
nRBC: 0 % (ref 0.0–0.2)

## 2024-03-23 MED ORDER — DULOXETINE HCL 60 MG PO CPEP
60.0000 mg | ORAL_CAPSULE | Freq: Every day | ORAL | Status: DC
  Administered 2024-03-24 – 2024-03-26 (×3): 60 mg via ORAL
  Filled 2024-03-23 (×3): qty 1

## 2024-03-23 MED ORDER — FUROSEMIDE 40 MG PO TABS
40.0000 mg | ORAL_TABLET | Freq: Two times a day (BID) | ORAL | Status: DC
  Administered 2024-03-23 – 2024-03-26 (×6): 40 mg via ORAL
  Filled 2024-03-23 (×6): qty 1

## 2024-03-23 MED ORDER — OXYCODONE HCL 5 MG PO TABS
5.0000 mg | ORAL_TABLET | ORAL | Status: DC | PRN
  Administered 2024-03-23 – 2024-03-26 (×7): 5 mg via ORAL
  Filled 2024-03-23 (×7): qty 1

## 2024-03-23 NOTE — Plan of Care (Signed)
  Problem: Activity: Goal: Capacity to carry out activities will improve Outcome: Progressing   Problem: Cardiac: Goal: Ability to achieve and maintain adequate cardiopulmonary perfusion will improve Outcome: Progressing   

## 2024-03-23 NOTE — Plan of Care (Signed)

## 2024-03-23 NOTE — Progress Notes (Signed)
 "  Janice Ryan  FMW:[id number] DOB: [birth year] DOA: 03/20/2024 PCP: Jaycee Greig PARAS, NP    Brief Narrative:  68 year old with a history of morbid obesity (BMI > 60), OSA on nightly BiPAP, obesity hypoventilation syndrome requiring 5 L nasal cannula during the day, chronic back pain, chronic right hip pain, chronic right knee pain, chronic diastolic CHF, HTN, depression, and CKD stage IIIa with baseline creatinine approximately 1.5 who presented to the ER 12/18 with worsening of her chronic back pain despite ongoing use of her usual dose of Neurontin .  Goals of Care:   Code Status: Full Code   DVT prophylaxis: heparin  injection 5,000 Units Start: 03/20/24 1000   Interim Hx: No acute events reported overnight.  Afebrile.  Vital signs stable.  Patient reports tremors of arms and legs persist.  She states she has actually had this problem before and that it improved with use of duloxetine .  She also complains of ongoing severe pain.  She reports that she has tolerated low-dose oxycodone  in the past without difficulty.  Assessment & Plan:  Acute on chronic low back right hip and right knee pain Chronic pain has been present for months to years now but has gradually been worsening - followed by Dr. Lizette at [facility] - no worrisome findings such as loss of sensation worsening weakness or loss of bowel or bladder continence - CTa L-spine obtained less than a week ago noted L2-L3 and L4-L5 spine degeneration with some spinal stenosis and foraminal stenosis at L4 - avoiding escalating dose of narcotics due to high risk of narcotic induced hypoventilation, but given persistent pain and reported history of tolerating low-dose oxycodone  I will give this a try  Tremor/jerking movements Etiology unclear - seems to be intermittent, and possibly worse when mentioned -continue duloxetine  dosing - ?psychosomatic - give trial of propranolol    Chronic diastolic CHF Well compensated at the present -  resumed diuretic 12/20  Acute kidney injury on CKD stage IIIa Baseline creatinine approximately 1.5 - crt worsened with hydration -creatinine now improving after resumption of diuretic  HTN BP stable at present  Chronic normocytic anemia of CKD Hemoglobin stable  Depression/anxiety Continue usual home meds  Morbid obesity (Body mass index is 62.64 kg/m.) - OHS with Sleep Apnea Requires BiPAP use at bedtime and 5 L nasal cannula support during the day -at exceedingly high risk for respiratory compromise with use of narcotics or other sedating medications   Family Communication: No family present at time of exam Disposition: Medically stable for disposition Skilled Nursing-Short Term Rehab (<3 Hours/Day)03/21/2024 1003  Objective: Blood pressure (!) 126/57, pulse 61, temperature 97.9 F (36.6 C), temperature source Oral, resp. rate 17, height 5' 8 (1.727 m), weight (!) 186.9 kg, SpO2 100%.  Intake/Output Summary (Last 24 hours) at 03/23/2024 0942 Last data filed at 03/23/2024 9352 Gross per 24 hour  Intake 240 ml  Output 3850 ml  Net -3610 ml   Filed Weights   03/20/24 0257  Weight: (!) 186.9 kg    Examination: General: No acute respiratory distress Lungs: Clear to auscultation bilaterally - breath sounds distant due to body habitus  Cardiovascular: Regular rate and rhythm - HS distant  Abdomen: Morbidly obese  - nontender, soft, bowel sounds positive, no rebound Extremities: 1+ chronic appearing B LE edema   CBC: Recent Labs  Lab 03/20/24 0423 03/21/24 0535 03/23/24 0215  WBC 9.2 7.8 8.9  NEUTROABS 5.8 4.6  --   HGB 10.9* 9.8* 9.9*  HCT 36.7 33.0* 32.7*  MCV 96.6 96.5 96.7  PLT 287 274 258   Basic Metabolic Panel: Recent Labs  Lab 03/21/24 0535 03/22/24 0440 03/23/24 0215  NA 138 136 137  K 4.5 4.6 5.0  CL 96* 96* 95*  CO2 36* 34* 37*  GLUCOSE 110* 106* 96  BUN 26* 36* 39*  CREATININE 1.95* 2.32* 1.71*  CALCIUM 10.2 9.6 10.0  MG 2.1  --   --     GFR: Estimated Creatinine Clearance: 57.8 mL/min (A) (by C-G formula based on SCr of 1.71 mg/dL (H)).   Scheduled Meds:  doxepin   50 mg Oral QHS   DULoxetine   120 mg Oral Daily   fluticasone   1 spray Each Nare Daily   furosemide   40 mg Intravenous Q12H   gabapentin   300 mg Oral BID   heparin   5,000 Units Subcutaneous Q8H   lidocaine   1 patch Transdermal Q24H   mirtazapine   15 mg Oral QHS   pantoprazole   40 mg Oral Daily   sodium chloride  flush  3 mL Intravenous Q12H   venlafaxine  XR  75 mg Oral Q breakfast     LOS: 0 days   Reyes IVAR Moores, MD [facility] Office  [phone] Pager - Text Page per Tracey  If 7PM-7AM, please contact night-coverage per Amion 03/23/2024, 9:42 AM     "

## 2024-03-24 DIAGNOSIS — M48061 Spinal stenosis, lumbar region without neurogenic claudication: Secondary | ICD-10-CM | POA: Diagnosis present

## 2024-03-24 DIAGNOSIS — F32A Depression, unspecified: Secondary | ICD-10-CM | POA: Diagnosis present

## 2024-03-24 DIAGNOSIS — E860 Dehydration: Secondary | ICD-10-CM | POA: Diagnosis present

## 2024-03-24 DIAGNOSIS — I5032 Chronic diastolic (congestive) heart failure: Secondary | ICD-10-CM | POA: Diagnosis present

## 2024-03-24 DIAGNOSIS — E662 Morbid (severe) obesity with alveolar hypoventilation: Secondary | ICD-10-CM | POA: Diagnosis present

## 2024-03-24 DIAGNOSIS — Z6841 Body Mass Index (BMI) 40.0 and over, adult: Secondary | ICD-10-CM | POA: Diagnosis not present

## 2024-03-24 DIAGNOSIS — G253 Myoclonus: Secondary | ICD-10-CM | POA: Diagnosis present

## 2024-03-24 DIAGNOSIS — G8929 Other chronic pain: Secondary | ICD-10-CM | POA: Diagnosis present

## 2024-03-24 DIAGNOSIS — I10 Essential (primary) hypertension: Secondary | ICD-10-CM | POA: Diagnosis not present

## 2024-03-24 DIAGNOSIS — Z8249 Family history of ischemic heart disease and other diseases of the circulatory system: Secondary | ICD-10-CM | POA: Diagnosis not present

## 2024-03-24 DIAGNOSIS — F411 Generalized anxiety disorder: Secondary | ICD-10-CM | POA: Diagnosis present

## 2024-03-24 DIAGNOSIS — I130 Hypertensive heart and chronic kidney disease with heart failure and stage 1 through stage 4 chronic kidney disease, or unspecified chronic kidney disease: Secondary | ICD-10-CM | POA: Diagnosis present

## 2024-03-24 DIAGNOSIS — J9612 Chronic respiratory failure with hypercapnia: Secondary | ICD-10-CM | POA: Diagnosis present

## 2024-03-24 DIAGNOSIS — D631 Anemia in chronic kidney disease: Secondary | ICD-10-CM | POA: Diagnosis present

## 2024-03-24 DIAGNOSIS — N1831 Chronic kidney disease, stage 3a: Secondary | ICD-10-CM | POA: Diagnosis present

## 2024-03-24 DIAGNOSIS — Z9981 Dependence on supplemental oxygen: Secondary | ICD-10-CM | POA: Diagnosis not present

## 2024-03-24 DIAGNOSIS — G4733 Obstructive sleep apnea (adult) (pediatric): Secondary | ICD-10-CM | POA: Diagnosis not present

## 2024-03-24 DIAGNOSIS — M545 Low back pain, unspecified: Secondary | ICD-10-CM | POA: Diagnosis not present

## 2024-03-24 DIAGNOSIS — M5116 Intervertebral disc disorders with radiculopathy, lumbar region: Secondary | ICD-10-CM | POA: Diagnosis present

## 2024-03-24 DIAGNOSIS — J9611 Chronic respiratory failure with hypoxia: Secondary | ICD-10-CM | POA: Diagnosis present

## 2024-03-24 DIAGNOSIS — N179 Acute kidney failure, unspecified: Secondary | ICD-10-CM | POA: Diagnosis present

## 2024-03-24 MED ORDER — PROPRANOLOL HCL 10 MG PO TABS
10.0000 mg | ORAL_TABLET | Freq: Three times a day (TID) | ORAL | Status: DC
  Administered 2024-03-24 – 2024-03-26 (×7): 10 mg via ORAL
  Filled 2024-03-24 (×9): qty 1

## 2024-03-24 MED ORDER — LIDOCAINE 5 % EX PTCH
1.0000 | MEDICATED_PATCH | CUTANEOUS | Status: DC
  Administered 2024-03-24 – 2024-03-25 (×2): 1 via TRANSDERMAL
  Filled 2024-03-24 (×2): qty 1

## 2024-03-24 NOTE — Progress Notes (Signed)
 "  Janice Ryan  FMW:[id number] DOB: 1956/03/10 DOA: 03/20/2024 PCP: Jaycee Greig PARAS, NP    Brief Narrative:  68 year old with a history of morbid obesity (BMI > 60), OSA on nightly BiPAP, obesity hypoventilation syndrome requiring 5 L nasal cannula during the day, chronic back pain, chronic right hip pain, chronic right knee pain, chronic diastolic CHF, HTN, depression, and CKD stage IIIa with baseline creatinine approximately 1.5 who presented to the ER 12/18 with worsening of her chronic back pain despite ongoing use of her usual dose of Neurontin .  Goals of Care:   Code Status: Full Code   DVT prophylaxis: heparin  injection 5,000 Units Start: 03/20/24 1000   Interim Hx: No acute events recorded overnight.  Afebrile.  Vital signs stable.  Tells me she is still very much bothered by her myoclonic jerks.  Does not feel they have improved.  Also feels that her pain is very poorly controlled.  We had an extensive discussion in which I explained my concern that she could not safely return home and that I felt it would be best for her to enter an SNF for a rehab stay.  She tells me she is not interested in this.  I have also explained that I cannot safely further titrate her narcotic pain medications due to her significant chronic respiratory issues.  Assessment & Plan:  Acute on chronic low back right hip and right knee pain Chronic pain has been present for months to years now but has gradually been worsening - followed by Dr. 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 along with venous blood gas for pH  Chronic diastolic CHF Well compensated at the present - resumed diuretic 12/20  Acute kidney injury on CKD stage IIIa Baseline creatinine approximately 1.5 - crt worsened with hydration -creatinine now improving after resumption of diuretic -continue to follow  Recent Labs  Lab 03/20/24 0343 03/20/24 0423 03/21/24 0535 03/22/24 0440 03/23/24 0215  CREATININE 1.90* 1.61* 1.95* 2.32* 1.71*     HTN BP stable at present  Chronic normocytic anemia of CKD Hemoglobin stable  Depression/anxiety Continue usual home meds  Morbid obesity (Body mass index is 62.64 kg/m.) - OHS with Sleep Apnea Requires BiPAP use at bedtime and 5 L nasal cannula support during the day -at exceedingly high risk for respiratory compromise with use of narcotics or other sedating medications   Family Communication: No family present at time of exam Disposition: Patient is currently refusing SNF placement and desires discharge home when stable Skilled Nursing-Short Term Rehab (<3 Hours/Day)03/21/2024 1003  Objective: Blood pressure (!) 120/53, pulse 73, temperature 97.8 F (36.6 C), temperature source Oral, resp. rate 16, height 5' 8 (1.727 m), weight (!) 186.9 kg, SpO2 93%.  Intake/Output Summary (Last 24 hours) at 03/24/2024 0939 Last data filed at 03/24/2024 0711 Gross per 24 hour  Intake 236 ml  Output 2000 ml  Net -1764 ml   [employer]  03/20/24 0257  Weight: (!) 186.9 kg    Examination: General: No acute respiratory distress -myoclonic jerks appreciable during conversation Lungs: Clear to auscultation bilaterally - breath sounds distant due to body habitus  Cardiovascular: Regular rate and rhythm - HS distant  Abdomen: Morbidly obese  -  nontender, soft, bowel sounds positive, no rebound Extremities: 1+ chronic appearing B LE edema   CBC: Recent Labs  Lab 03/20/24 0423 03/21/24 0535 03/23/24 0215  WBC 9.2 7.8 8.9  NEUTROABS 5.8 4.6  --   HGB 10.9* 9.8* 9.9*  HCT 36.7 33.0* 32.7*  MCV 96.6 96.5 96.7  PLT 287 274 258   Basic Metabolic Panel: Recent Labs  Lab 03/21/24 0535 03/22/24 0440 03/23/24 0215  NA 138 136 137  K 4.5 4.6 5.0  CL 96* 96* 95*  CO2 36* 34* 37*  GLUCOSE 110* 106* 96  BUN 26* 36* 39*  CREATININE 1.95* 2.32* 1.71*  CALCIUM 10.2 9.6 10.0  MG 2.1  --   --    GFR: Estimated Creatinine Clearance: 57.8 mL/min (A) (by C-G formula based on SCr of 1.71 mg/dL (H)).   Scheduled Meds:  DULoxetine   60 mg Oral Daily   fluticasone   1 spray Each Nare Daily   furosemide   40 mg Oral BID   gabapentin   300 mg Oral BID   heparin   5,000 Units Subcutaneous Q8H   lidocaine   1 patch Transdermal Q24H   mirtazapine   15 mg Oral QHS   pantoprazole   40 mg Oral Daily   sodium chloride  flush  3 mL Intravenous Q12H   venlafaxine  XR  75 mg Oral Q breakfast     LOS: 0 days   Reyes IVAR Moores, MD [facility] Office  [phone] Pager - Text Page per Tracey  If 7PM-7AM, please contact night-coverage per Amion 03/24/2024, 9:39 AM     "

## 2024-03-24 NOTE — Plan of Care (Signed)

## 2024-03-24 NOTE — TOC Progression Note (Signed)
 Transition of Care [facility]) - Progression Note    Patient Details  Name: Janice Ryan MRN: [id number] Date of Birth: 1955-08-22  [facility] CM/SW Contact  Graves-Bigelow, Erminio Deems, RN Phone Number: 03/24/2024, 3:29 PM  Clinical Narrative: Patient was discussed in progression rounds. ICM received notification that the patient has declined SNF and wants to return home with [facility] Services. Patient is currently active with [facility] for personal care aide that visits 2 hours a day-3 days a week. Patient states her son is in the home. Patient has DME wheelchair, bedside commode, and rollator. Patent states she has a PCP and she uses [employer] for transportation via [facility] Medicare. ICM discussed disposition plan and patient is asking for [facility] PT with [facility]. Referral submitted via the hub and [facility] has accepted. ICM will continue to follow for additional needs as the patient progresses. Patient will benefit from PTAR transportation home.     Expected Discharge Plan: Home w Home Health Services Barriers to Discharge: Continued Medical Work up               Expected Discharge Plan and Services       Living arrangements for the past 2 months: Single Family Home                           HH Arranged: PT HH Agency: [facility] Date [facility] Agency Contacted: 03/24/24 Time HH Agency Contacted: 1529 Representative spoke with at [facility] Agency: hub   Social Drivers of Health (SDOH) Interventions SDOH Screenings   Food Insecurity: No Food Insecurity (03/20/2024)  Housing: Low Risk (03/20/2024)  Transportation Needs: Unmet Transportation Needs (03/20/2024)  Utilities: Not At Risk (03/20/2024)  Depression (PHQ2-9): Low Risk (07/06/2021)  Social Connections: Socially Integrated (03/20/2024)  Tobacco Use: Low Risk (03/22/2024)    Readmission Risk Interventions    10/09/2023   10:24 AM 09/27/2023    2:36 PM 03/06/2023   11:10 AM  Readmission Risk Prevention Plan   Transportation Screening Complete Complete Complete  PCP or Specialist Appt within 5-7 Days  Complete Complete  PCP or Specialist Appt within 3-5 Days Complete    Home Care Screening  Complete Complete  Medication Review (RN CM)  Complete Complete  HRI or Home Care Consult Complete    Social Work Consult for Recovery Care Planning/Counseling Complete    Palliative Care Screening Not Applicable    Medication Review [employer]) Complete

## 2024-03-24 NOTE — Progress Notes (Signed)
 PT Cancellation Note  Patient Details Name: Janice Ryan MRN: [id number] DOB: 05/24/1955   Cancelled Treatment:    Reason Eval/Treat Not Completed: Pain limiting ability to participate. Pt firmly refused therapy today due to neck and back pain that she rates at a 9/10 as well as tremor that she reports is not under control. Will check back tomorrow.   Richerd Lipoma, PT  Acute Rehab Services Secure chat preferred Office [phone]    [name] 03/24/2024, 12:26 PM

## 2024-03-25 DIAGNOSIS — N179 Acute kidney failure, unspecified: Secondary | ICD-10-CM | POA: Diagnosis not present

## 2024-03-25 LAB — COMPREHENSIVE METABOLIC PANEL WITH GFR
ALT: 5 U/L (ref 0–44)
AST: 10 U/L — ABNORMAL LOW (ref 15–41)
Albumin: 3.5 g/dL (ref 3.5–5.0)
Alkaline Phosphatase: 78 U/L (ref 38–126)
Anion gap: 6 (ref 5–15)
BUN: 43 mg/dL — ABNORMAL HIGH (ref 8–23)
CO2: 39 mmol/L — ABNORMAL HIGH (ref 22–32)
Calcium: 11 mg/dL — ABNORMAL HIGH (ref 8.9–10.3)
Chloride: 93 mmol/L — ABNORMAL LOW (ref 98–111)
Creatinine, Ser: 1.26 mg/dL — ABNORMAL HIGH (ref 0.44–1.00)
GFR, Estimated: 47 mL/min — ABNORMAL LOW
Glucose, Bld: 109 mg/dL — ABNORMAL HIGH (ref 70–99)
Potassium: 4.7 mmol/L (ref 3.5–5.1)
Sodium: 138 mmol/L (ref 135–145)
Total Bilirubin: 0.4 mg/dL (ref 0.0–1.2)
Total Protein: 6.6 g/dL (ref 6.5–8.1)

## 2024-03-25 LAB — BLOOD GAS, VENOUS
Acid-Base Excess: 16.1 mmol/L — ABNORMAL HIGH (ref 0.0–2.0)
Bicarbonate: 44.7 mmol/L — ABNORMAL HIGH (ref 20.0–28.0)
Drawn by: 4956
O2 Saturation: 98.1 %
Patient temperature: 37.1
pCO2, Ven: 81 mmHg (ref 44–60)
pH, Ven: 7.35 (ref 7.25–7.43)
pO2, Ven: 91 mmHg — ABNORMAL HIGH (ref 32–45)

## 2024-03-25 LAB — AMMONIA: Ammonia: 66 umol/L — ABNORMAL HIGH (ref 9–35)

## 2024-03-25 MED ORDER — LACTULOSE 10 GM/15ML PO SOLN
20.0000 g | Freq: Three times a day (TID) | ORAL | Status: DC
  Administered 2024-03-25: 20 g via ORAL
  Filled 2024-03-25: qty 30

## 2024-03-25 MED ORDER — LACTULOSE 10 GM/15ML PO SOLN
20.0000 g | Freq: Two times a day (BID) | ORAL | Status: DC
  Administered 2024-03-26: 20 g via ORAL
  Filled 2024-03-25: qty 30

## 2024-03-25 MED ORDER — LACTULOSE 10 GM/15ML PO SOLN: 20.0000 g | Freq: Two times a day (BID) | ORAL | Status: DC

## 2024-03-25 NOTE — Progress Notes (Signed)
 "  Janice Ryan  FMW:[id number] DOB: 1955/10/14 DOA: 03/20/2024 PCP: Jaycee Greig PARAS, NP    Brief Narrative:  68 year old with a history of morbid obesity (BMI > 60), OSA on nightly BiPAP, obesity hypoventilation syndrome requiring 5 L nasal cannula during the day, chronic back pain, chronic right hip pain, chronic right knee pain, chronic diastolic CHF, HTN, depression, and CKD stage IIIa with baseline creatinine approximately 1.5 who presented to the ER 12/18 with worsening of her chronic back pain despite ongoing use of her usual dose of Neurontin .  Goals of Care:   Code Status: Full Code   DVT prophylaxis: heparin  injection 5,000 Units Start: 03/20/24 1000   Interim Hx: No acute events recorded overnight.  Afebrile.  Vital signs stable.  On further history today the patient reports that she feels her jerking movements do seem to worsen around the time of her Neurontin  dosing.  She reports that her pain is improved with her current pain medication regimen.  She has no new complaints today.  Assessment & Plan:  Acute on chronic low back right hip and right knee pain Chronic pain has been present for months to years now but has gradually been worsening - followed by Dr. Sherida at [facility] - no worrisome findings such as loss of sensation worsening weakness or loss of bowel or bladder continence - CTa L-spine obtained less than a week ago noted L2-L3 and L4-L5 spine degeneration with some spinal stenosis and foraminal stenosis at L4 - avoiding escalating dose of narcotics due to high risk of narcotic induced hypoventilation, but given persistent pain and reported history of tolerating low-dose oxycodone  this is cautiously being administered on a trial basis  Tremor/myoclonic movements Etiology unclear - seems to be intermittent - possible gabapentin  induced myoclonus - discontinued gabapentin  and monitor - ammonia is elevated therefore will give trial of lactulose  - may also be related  to chronically elevated pCO2 (though well compensated) of 81  Chronic diastolic CHF Well compensated at the present - resumed diuretic 12/20  Acute kidney injury on CKD stage IIIa Baseline creatinine approximately 1.5 - crt worsened with hydration - creatinine now improving after resumption of diuretic - continue to follow intermittently  Recent Labs  Lab 03/20/24 0423 03/21/24 0535 03/22/24 0440 03/23/24 0215 03/25/24 0503  CREATININE 1.61* 1.95* 2.32* 1.71* 1.26*    HTN BP stable at present  Chronic normocytic anemia of CKD Hemoglobin stable  Depression/anxiety Continue usual home meds  Morbid obesity (Body mass index is 62.64 kg/m.) - OHS with Sleep Apnea Requires BiPAP use at bedtime and 5 L nasal cannula support during the day -at exceedingly high risk for respiratory compromise with use of narcotics or other sedating medications - baseline pCO2 appears to be ~80     Family Communication: No family present at time of exam Disposition: Patient is currently refusing SNF placement and desires discharge home when stable - anticipate d/c home in ~48hrs if pain controlled and jerking movements improved  Skilled Nursing-Short Term Rehab (<3 Hours/Day)03/21/2024 1003  Objective: Blood pressure (!) 119/54, pulse 64, temperature 98.9 F (37.2 C), temperature source Oral, resp. rate 16, height 5' 8 (1.727 m), weight (!) 186.9 kg, SpO2 92%.  Intake/Output Summary (Last 24 hours) at 03/25/2024 0954 Last data filed at 03/25/2024 0447 Gross per 24 hour  Intake 360 ml  Output 3850 ml  Net -3490 ml   Filed Weights   03/20/24 0257  Weight: (!) 186.9 kg    Examination: General: No acute  respiratory distress - myoclonic jerks still appreciable during conversation Lungs: Clear to auscultation bilaterally - breath sounds distant due to body habitus  Cardiovascular: RRR - HS distant  Abdomen: Morbidly obese  - nontender, soft, bowel sounds positive, no rebound Extremities:  1+ chronic appearing B LE edema w/o change   CBC: Recent Labs  Lab 03/20/24 0423 03/21/24 0535 03/23/24 0215  WBC 9.2 7.8 8.9  NEUTROABS 5.8 4.6  --   HGB 10.9* 9.8* 9.9*  HCT 36.7 33.0* 32.7*  MCV 96.6 96.5 96.7  PLT 287 274 258   Basic Metabolic Panel: Recent Labs  Lab 03/21/24 0535 03/22/24 0440 03/23/24 0215 03/25/24 0503  NA 138 136 137 138  K 4.5 4.6 5.0 4.7  CL 96* 96* 95* 93*  CO2 36* 34* 37* 39*  GLUCOSE 110* 106* 96 109*  BUN 26* 36* 39* 43*  CREATININE 1.95* 2.32* 1.71* 1.26*  CALCIUM 10.2 9.6 10.0 11.0*  MG 2.1  --   --   --    GFR: Estimated Creatinine Clearance: 78.4 mL/min (A) (by C-G formula based on SCr of 1.26 mg/dL (H)).   Scheduled Meds:  DULoxetine   60 mg Oral Daily   fluticasone   1 spray Each Nare Daily   furosemide   40 mg Oral BID   heparin   5,000 Units Subcutaneous Q8H   lidocaine   1 patch Transdermal Q24H   lidocaine   1 patch Transdermal Q24H   mirtazapine   15 mg Oral QHS   pantoprazole   40 mg Oral Daily   propranolol   10 mg Oral TID   sodium chloride  flush  3 mL Intravenous Q12H     LOS: 1 day   Janice IVAR Moores, MD [facility] Office  [phone] Pager - Text Page per Tracey  If 7PM-7AM, please contact night-coverage per Amion 03/25/2024, 9:54 AM     "

## 2024-03-25 NOTE — Plan of Care (Signed)
  Problem: Education: Goal: Knowledge of General Education information will improve Description: Including pain rating scale, medication(s)/side effects and non-pharmacologic comfort measures Outcome: Progressing   Problem: Clinical Measurements: Goal: Will remain free from infection Outcome: Progressing Goal: Diagnostic test results will improve Outcome: Progressing Goal: Respiratory complications will improve Outcome: Progressing Goal: Cardiovascular complication will be avoided Outcome: Progressing   Problem: Nutrition: Goal: Adequate nutrition will be maintained Outcome: Progressing   Problem: Coping: Goal: Level of anxiety will decrease Outcome: Progressing

## 2024-03-25 NOTE — Progress Notes (Signed)
 Physical Therapy Treatment Patient Details Name: Janice Ryan MRN: [id number] DOB: 01/06/1956 Today's Date: 03/25/2024   History of Present Illness 68 y.o. female admitted 03/20/24 with worsening low back pain, radiating into RLE. Recent lumbar CT 03/15/24 showed chronic severe degeneration L2-L5, multifactorial spinal stenosis, moderate to severe L4 neural foraminal stenosis. PMH includes chronic pain, HTN, CKD, CHF, OHS (5L O2 [place]), OSA on BiPAP, obesity.    PT Comments  Progress limited this session secondary to pain. Pt able to perform bed mobility with maximal physical assistance of 2 and attempted lateral scoots with maximal physical assistance of 2. Pt would benefit from pre-medicating for session and is willing to trial transfers and standing next session with +2. Education provided throughout on the importance of frequent mobilization to reduce muscle stiffness and risk of bed sores. PT will continue to treat pt while she is admitted. Patient will benefit from continued inpatient follow up therapy, <3 hours/day.    If plan is discharge home, recommend the following: A lot of help with walking and/or transfers;A lot of help with bathing/dressing/bathroom;Assist for transportation;Help with stairs or ramp for entrance;Assistance with cooking/housework   Can travel by private vehicle     No  Equipment Recommendations  Hospital bed;Hoyer lift    Recommendations for Other Services       Precautions / Restrictions Precautions Precautions: Fall Recall of Precautions/Restrictions: Intact Restrictions Weight Bearing Restrictions Per Provider Order: No     Mobility  Bed Mobility Overal bed mobility: Needs Assistance Bed Mobility: Rolling, Supine to Sit, Sit to Supine Rolling: Mod assist, Used rails (for bed pad placement)   Supine to sit: Max assist, +2 for physical assistance, HOB elevated Sit to supine: Max assist, +2 for physical assistance   General bed mobility  comments: pt able to initiate movement of LLE but requires physical assistance for advancing RLE and for trunk management to obtain seated upright at EOB. VC given for sequencing; increased time to complete.    Transfers Overall transfer level: Needs assistance Equipment used: 2 person hand held assist               General transfer comment: pt refusing to stand, but attempted lateral scoots towards HOB with max A +2 via bed pad. VC given for step by step sequencing; increased time to complete.    Ambulation/Gait                   Stairs             Wheelchair Mobility     Tilt Bed    Modified Rankin (Stroke Patients Only)       Balance Overall balance assessment: Needs assistance Sitting-balance support: Bilateral upper extremity supported, Feet supported Sitting balance-Leahy Scale: Poor Sitting balance - Comments: pt requires UE support on bed for maintaing stability while seated Postural control: Posterior lean (several instances of posterior lean but able to self correct with cueing)                                  Communication Communication Communication: No apparent difficulties  Cognition Arousal: Alert Behavior During Therapy: WFL for tasks assessed/performed   PT - Cognitive impairments: No apparent impairments                         Following commands: Intact      Cueing Cueing Techniques: Verbal  cues, Visual cues  Exercises General Exercises - Lower Extremity Long Arc Quad: AROM, Both, 10 reps, Seated (reduced ROM on L>R) Heel Slides: AROM, Right, 10 reps, Seated (limited secondary to pain) Hip Flexion/Marching: AROM, Left, 10 reps, Seated    General Comments        Pertinent Vitals/Pain Pain Assessment Pain Assessment: Faces Faces Pain Scale: Hurts whole lot Pain Location: low back Pain Descriptors / Indicators: Aching, Discomfort, Grimacing, Guarding, Moaning Pain Intervention(s): Limited  activity within patient's tolerance, Monitored during session, RN gave pain meds during session    Home Living                          Prior Function            PT Goals (current goals can now be found in the care plan section) Acute Rehab PT Goals Patient Stated Goal: to go home PT Goal Formulation: With patient Time For Goal Achievement: 04/04/24 Potential to Achieve Goals: Fair Progress towards PT goals: Progressing toward goals    Frequency    Min 2X/week      PT Plan      Co-evaluation              AM-PAC PT 6 Clicks Mobility   Outcome Measure  Help needed turning from your back to your side while in a flat bed without using bedrails?: A Lot Help needed moving from lying on your back to sitting on the side of a flat bed without using bedrails?: Total Help needed moving to and from a bed to a chair (including a wheelchair)?: Total Help needed standing up from a chair using your arms (e.g., wheelchair or bedside chair)?: Total Help needed to walk in hospital room?: Total Help needed climbing 3-5 steps with a railing? : Total 6 Click Score: 7    End of Session Equipment Utilized During Treatment: Gait belt Activity Tolerance: Patient limited by pain Patient left: in bed;with call bell/phone within reach Nurse Communication: Mobility status PT Visit Diagnosis: Unsteadiness on feet (R26.81);Other abnormalities of gait and mobility (R26.89);Muscle weakness (generalized) (M62.81);History of falling (Z91.81)     Time: 8896-8873 PT Time Calculation (min) (ACUTE ONLY): 23 min  Charges:    $Therapeutic Activity: 23-37 mins PT General Charges $$ ACUTE PT VISIT: 1 Visit                     Janice Ryan DPT Acute Rehab Services [phone] Prefer contact via chat    Janice Ryan Janice Ryan 03/25/2024, 12:27 PM

## 2024-03-25 NOTE — Progress Notes (Signed)
 Date and time results received: 03/25/2024 0526  Test: VBG Critical Value: PCO2 81  Name of Provider Notified: Dr. Franky Orders Received? Or Actions Taken?: Provider came at the bedside and assessed the patient. No further orders made.  Hansel Devan, RN

## 2024-03-25 NOTE — Plan of Care (Signed)
°  Problem: Clinical Measurements: Goal: Respiratory complications will improve Outcome: Progressing Goal: Cardiovascular complication will be avoided Outcome: Progressing   Problem: Nutrition: Goal: Adequate nutrition will be maintained Outcome: Progressing   Problem: Elimination: Goal: Will not experience complications related to urinary retention Outcome: Progressing   Problem: Safety: Goal: Ability to remain free from injury will improve Outcome: Progressing   Problem: Cardiac: Goal: Ability to achieve and maintain adequate cardiopulmonary perfusion will improve Outcome: Progressing

## 2024-03-26 ENCOUNTER — Other Ambulatory Visit (HOSPITAL_COMMUNITY): Payer: Self-pay

## 2024-03-26 DIAGNOSIS — M545 Low back pain, unspecified: Secondary | ICD-10-CM

## 2024-03-26 DIAGNOSIS — I5032 Chronic diastolic (congestive) heart failure: Secondary | ICD-10-CM | POA: Diagnosis not present

## 2024-03-26 DIAGNOSIS — J9611 Chronic respiratory failure with hypoxia: Secondary | ICD-10-CM

## 2024-03-26 DIAGNOSIS — G8929 Other chronic pain: Secondary | ICD-10-CM | POA: Diagnosis not present

## 2024-03-26 DIAGNOSIS — G4733 Obstructive sleep apnea (adult) (pediatric): Secondary | ICD-10-CM

## 2024-03-26 DIAGNOSIS — I10 Essential (primary) hypertension: Secondary | ICD-10-CM

## 2024-03-26 DIAGNOSIS — F411 Generalized anxiety disorder: Secondary | ICD-10-CM | POA: Diagnosis not present

## 2024-03-26 DIAGNOSIS — N179 Acute kidney failure, unspecified: Secondary | ICD-10-CM | POA: Diagnosis not present

## 2024-03-26 LAB — BASIC METABOLIC PANEL WITH GFR
Anion gap: 6 (ref 5–15)
BUN: 35 mg/dL — ABNORMAL HIGH (ref 8–23)
CO2: 43 mmol/L — ABNORMAL HIGH (ref 22–32)
Calcium: 11.4 mg/dL — ABNORMAL HIGH (ref 8.9–10.3)
Chloride: 90 mmol/L — ABNORMAL LOW (ref 98–111)
Creatinine, Ser: 0.93 mg/dL (ref 0.44–1.00)
GFR, Estimated: >60 mL/min
Glucose, Bld: 101 mg/dL — ABNORMAL HIGH (ref 70–99)
Potassium: 4.1 mmol/L (ref 3.5–5.1)
Sodium: 139 mmol/L (ref 135–145)

## 2024-03-26 LAB — AMMONIA: Ammonia: 38 umol/L — ABNORMAL HIGH (ref 9–35)

## 2024-03-26 LAB — GLUCOSE, CAPILLARY: Glucose-Capillary: 107 mg/dL — ABNORMAL HIGH (ref 70–99)

## 2024-03-26 MED ORDER — LIDOCAINE 5 % EX PTCH
1.0000 | MEDICATED_PATCH | CUTANEOUS | 0 refills | Status: AC
  Filled 2024-03-26: qty 30, 30d supply, fill #0

## 2024-03-26 MED ORDER — OXYCODONE HCL 5 MG PO TABS
5.0000 mg | ORAL_TABLET | Freq: Four times a day (QID) | ORAL | 0 refills | Status: AC | PRN
  Filled 2024-03-26: qty 20, 5d supply, fill #0

## 2024-03-26 MED ORDER — PROPRANOLOL HCL 10 MG PO TABS
10.0000 mg | ORAL_TABLET | Freq: Three times a day (TID) | ORAL | 1 refills | Status: AC
  Filled 2024-03-26: qty 90, 30d supply, fill #0

## 2024-03-26 MED ORDER — METHOCARBAMOL 500 MG PO TABS
500.0000 mg | ORAL_TABLET | Freq: Three times a day (TID) | ORAL | 0 refills | Status: AC | PRN
  Filled 2024-03-26: qty 30, 10d supply, fill #0

## 2024-03-26 NOTE — TOC Transition Note (Signed)
 Transition of Care [facility]) - Discharge Note   Patient Details  Name: Janice Ryan MRN: [id number] Date of Birth: 10-19-1955  Transition of Care [facility]) CM/SW Contact:  Corean JAYSON Canary, RN Phone Number: 03/26/2024, 10:43 AM   Clinical Narrative:     Patient will be discharging home today with [facility] home health Orders for [facility] in  Awaiting word on calling PTAR  Final next level of care: Home w Home Health Services Barriers to Discharge: No Barriers Identified   Patient Goals and CMS Choice            Discharge Placement                       Discharge Plan and Services Additional resources added to the After Visit Summary for                            [facility] Arranged: OT, Social Work [facility] Agency: [facility] Date [facility] Agency Contacted: 03/24/24 Time HH Agency Contacted: 1529 Representative spoke with at [facility] Agency: hub  Social Drivers of Health (SDOH) Interventions SDOH Screenings   Food Insecurity: No Food Insecurity (03/20/2024)  Housing: Low Risk (03/20/2024)  Transportation Needs: Unmet Transportation Needs (03/20/2024)  Utilities: Not At Risk (03/20/2024)  Depression (PHQ2-9): Low Risk (07/06/2021)  Social Connections: Socially Integrated (03/20/2024)  Tobacco Use: Low Risk (03/22/2024)     Readmission Risk Interventions    10/09/2023   10:24 AM 09/27/2023    2:36 PM 03/06/2023   11:10 AM  Readmission Risk Prevention Plan  Transportation Screening Complete Complete Complete  PCP or Specialist Appt within 5-7 Days  Complete Complete  PCP or Specialist Appt within 3-5 Days Complete    Home Care Screening  Complete Complete  Medication Review (RN CM)  Complete Complete  HRI or Home Care Consult Complete    Social Work Consult for Recovery Care Planning/Counseling Complete    Palliative Care Screening Not Applicable    Medication Review [employer]) Complete

## 2024-03-26 NOTE — Progress Notes (Signed)
 Occupational Therapy Treatment Patient Details Name: Janice Ryan MRN: [id number] DOB: 31-Jan-1956 Today's Date: 03/26/2024   History of present illness 68 y.o. female admitted 03/20/24 with worsening low back pain, radiating into RLE. Recent lumbar CT 03/15/24 showed chronic severe degeneration L2-L5, multifactorial spinal stenosis, moderate to severe L4 neural foraminal stenosis. PMH includes chronic pain, HTN, CKD, CHF, OHS (5L O2 [place]), OSA on BiPAP, obesity.   OT comments  Patient stating decreased pain but apprehensive on participating with OT. Patient demonstrating gains with bed mobility with mod assist +2 to get to EOB.  Patient demonstrating gains with sitting balance and able to perform grooming seated on EOB. Patient able to perform lateral scooting towards [facility] with mod assist +2 before returning to supine. Patient states she felt better after participating in OT. OT recommendations for SNF but patient is returning home with family to assist and HHOT to follow.       If plan is discharge home, recommend the following:  A lot of help with bathing/dressing/bathroom;Two people to help with walking and/or transfers;Assist for transportation;Help with stairs or ramp for entrance   Equipment Recommendations  Wheelchair (measurements OT);Wheelchair cushion (measurements OT);Hospital bed;Hoyer lift;Other (comment) (bari BSC)    Recommendations for Other Services      Precautions / Restrictions Precautions Precautions: Fall Recall of Precautions/Restrictions: Intact Restrictions Weight Bearing Restrictions Per Provider Order: No       Mobility Bed Mobility Overal bed mobility: Needs Assistance Bed Mobility: Rolling, Supine to Sit, Sit to Supine Rolling: Mod assist   Supine to sit: Mod assist, +2 for physical assistance, HOB elevated Sit to supine: Mod assist, +2 for physical assistance   General bed mobility comments: patient assisting with moving BLE off EOB and  requiring assistance with trunk and scooting towards EOB    Transfers Overall transfer level: Needs assistance Equipment used: 2 person hand held assist Transfers: Bed to chair/wheelchair/BSC             General transfer comment: mod assist +2 for lateral scooting toward HOB     Balance Overall balance assessment: Needs assistance Sitting-balance support: Bilateral upper extremity supported, Feet supported, Single extremity supported Sitting balance-Leahy Scale: Poor Sitting balance - Comments: patient with supervision for sitting balance with one extremity support for balance                                   ADL either performed or assessed with clinical judgement   ADL Overall ADL's : Needs assistance/impaired     Grooming: Wash/dry hands;Wash/dry face;Oral care;Set up;Sitting Grooming Details (indicate cue type and reason): on EOB                                    Extremity/Trunk Assessment              Vision       Perception     Praxis     Communication Communication Communication: No apparent difficulties   Cognition Arousal: Alert Behavior During Therapy: WFL for tasks assessed/performed Cognition: No apparent impairments                               Following commands: Intact        Cueing   Cueing Techniques: Verbal cues, Visual cues  Exercises      Shoulder Instructions       General Comments      Pertinent Vitals/ Pain       Pain Assessment Pain Assessment: Faces Faces Pain Scale: Hurts little more Pain Location: low back Pain Descriptors / Indicators: Aching, Discomfort, Grimacing, Guarding Pain Intervention(s): Limited activity within patient's tolerance, Monitored during session, Premedicated before session, Repositioned  Home Living                                          Prior Functioning/Environment              Frequency  Min 2X/week         Progress Toward Goals  OT Goals(current goals can now be found in the care plan section)  Progress towards OT goals: Progressing toward goals  Acute Rehab OT Goals Patient Stated Goal: to go home OT Goal Formulation: With patient Time For Goal Achievement: 04/04/24 Potential to Achieve Goals: Good ADL Goals Pt Will Perform Grooming: with contact guard assist;with supervision;with set-up;sitting Pt Will Perform Upper Body Bathing: with contact guard assist;with supervision;sitting Pt Will Perform Lower Body Bathing: with max assist;with mod assist;sitting/lateral leans Pt Will Perform Upper Body Dressing: with contact guard assist;with supervision;sitting Pt Will Transfer to Toilet: with max assist;with mod assist;stand pivot transfer;bedside commode Pt Will Perform Toileting - Clothing Manipulation and hygiene: with max assist;with mod assist;sitting/lateral leans;sit to/from stand  Plan      Co-evaluation                 AM-PAC OT 6 Clicks Daily Activity     Outcome Measure   Help from another person eating meals?: None Help from another person taking care of personal grooming?: A Little Help from another person toileting, which includes using toliet, bedpan, or urinal?: Total Help from another person bathing (including washing, rinsing, drying)?: Total Help from another person to put on and taking off regular upper body clothing?: A Little Help from another person to put on and taking off regular lower body clothing?: Total 6 Click Score: 13    End of Session    OT Visit Diagnosis: Other abnormalities of gait and mobility (R26.89);Muscle weakness (generalized) (M62.81);Pain Pain - part of body:  (back)   Activity Tolerance Patient tolerated treatment well   Patient Left in bed;with call bell/phone within reach;with bed alarm set   Nurse Communication Mobility status        Time: 1041-1105 OT Time Calculation (min): 24 min  Charges: OT General  Charges $OT Visit: 1 Visit OT Treatments $Self Care/Home Management : 8-22 mins $Therapeutic Activity: 8-22 mins  Dick Laine, OTA Acute Rehabilitation Services  Office [phone]   Jeb LITTIE Laine 03/26/2024, 1:49 PM

## 2024-03-26 NOTE — Care Management (Signed)
 PTAR called for 1200 pickup

## 2024-03-26 NOTE — Discharge Summary (Signed)
 " Physician Discharge Summary   Patient: Janice Ryan MRN: [id number] DOB: 16-Dec-1955  Admit date:     03/20/2024  Discharge date: {dischdate:26783}  Discharge Physician: Concepcion Riser   PCP: Jaycee Greig PARAS, NP   Recommendations at discharge:  {Tip this will not be part of the note when signed- Example include specific recommendations for outpatient follow-up, pending tests to follow-up on. (Optional):26781}  ***  Discharge Diagnoses: Principal Problem:   AKI (acute kidney injury)  Resolved Problems:   * No resolved hospital problems. [facility] Course: No notes on file  Assessment and Plan: No notes have been filed under this hospital service. Service: Hospitalist     {Tip this will not be part of the note when signed Body mass index is 62.64 kg/m. , ,  (Optional):26781}  {(NOTE) Pain control PDMP Statment (Optional):26782} Consultants: *** Procedures performed: ***  Disposition: {Plan; Disposition:26390} Diet recommendation:  Discharge Diet Orders (From admission, onward)     Start     Ordered   03/26/24 0000  Diet - low sodium heart healthy        03/26/24 1031           {Diet_Plan:26776} DISCHARGE MEDICATION: Allergies as of 03/26/2024       Reactions   Sulfa Antibiotics Nausea And Vomiting, Other (See Comments)   Stomach pain        Medication List     STOP taking these medications    desvenlafaxine  50 MG 24 hr tablet Commonly known as: PRISTIQ    diclofenac  50 MG EC tablet Commonly known as: VOLTAREN    DULoxetine  60 MG capsule Commonly known as: CYMBALTA    lisinopril  20 MG tablet Commonly known as: ZESTRIL    mirtazapine  15 MG tablet Commonly known as: REMERON    oxyCODONE -acetaminophen  5-325 MG tablet Commonly known as: PERCOCET/ROXICET   traZODone  50 MG tablet Commonly known as: DESYREL        TAKE these medications    Acetaminophen  Extra Strength 500 MG Tabs Take 2 tablets (1,000 mg total) by mouth every  6 (six) hours as needed for mild pain (pain score 1-3).   albuterol  108 (90 Base) MCG/ACT inhaler Commonly known as: VENTOLIN  HFA Inhale 2 puffs into the lungs every 6 (six) hours as needed for wheezing/shortness of breath.   amphetamine -dextroamphetamine  20 MG 24 hr capsule Commonly known as: Adderall XR Take 1 capsule (20 mg total) by mouth daily. 04/05/24 Start taking on: April 05, 2024   busPIRone  15 MG tablet Commonly known as: BUSPAR  Take 1 tablet (15 mg total) by mouth 3 (three) times daily.   cholecalciferol  25 MCG (1000 UNIT) tablet Commonly known as: VITAMIN D3 Take 1,000 Units by mouth daily.   doxepin  50 MG capsule Commonly known as: SINEQUAN  Take 1 capsule (50 mg total) by mouth at bedtime.   fluticasone  50 MCG/ACT nasal spray Commonly known as: FLONASE  Place 2 sprays into both nostrils daily. What changed:  when to take this reasons to take this   fluticasone -salmeterol 500-50 MCG/ACT Aepb Commonly known as: Wixela Inhub  Inhale 1 puff into the lungs 2 (two) times daily. What changed: when to take this   furosemide  40 MG tablet Commonly known as: LASIX  Take 1 tablet (40 mg total) by mouth daily.   gabapentin  300 MG capsule Commonly known as: Neurontin  Take 1 capsule (300 mg total) by mouth at bedtime.   ipratropium-albuterol  0.5-2.5 (3) MG/3ML Soln Commonly known as: DUONEB Inhale 3 mLs into the lungs every 4 (four) hours as needed (Wheezing/SOB).  lidocaine  5 % Commonly known as: LIDODERM  Place 1 patch onto the skin daily. Remove & Discard patch within 12 hours or as directed by MD   methocarbamol  500 MG tablet Commonly known as: ROBAXIN  Take 1 tablet (500 mg total) by mouth every 8 (eight) hours as needed for muscle spasms.   montelukast  10 MG tablet Commonly known as: SINGULAIR  Take 1 tablet (10 mg total) by mouth at bedtime.   multivitamin with minerals Tabs tablet Take 1 tablet by mouth daily.   oxyCODONE  5 MG immediate release  tablet Commonly known as: Oxy IR/ROXICODONE  Take 1 tablet (5 mg total) by mouth every 6 (six) hours as needed for severe pain (pain score 7-10).   OXYGEN Inhale 5 L into the lungs continuous.   pantoprazole  40 MG tablet Commonly known as: PROTONIX  Take 1 tablet (40 mg total) by mouth daily.   potassium chloride  SA 20 MEQ tablet Commonly known as: KLOR-CON  M Take 1 tablet (20 mEq total) by mouth daily.   propranolol  10 MG tablet Commonly known as: INDERAL  Take 1 tablet (10 mg total) by mouth 3 (three) times daily.        Contact information for after-discharge care     Home Medical Care     [facility]) .   Service: Home Health Services Why: Physical Therapy-office to call with visit times. Contact information: [address] [facility]  [address] [phone]                    Discharge Exam: Fredricka Weights   03/20/24 0257  Weight: (!) 186.9 kg   ***  Condition at discharge: {DC Condition:26389}  The results of significant diagnostics from this hospitalization (including imaging, microbiology, ancillary and laboratory) are listed below for reference.   Imaging Studies: DG Hip Unilat W or Wo Pelvis 2-3 Views Right Result Date: 03/15/2024 CLINICAL DATA:  Hip pain. EXAM: DG HIP (WITH OR WITHOUT PELVIS) 2-3V RIGHT COMPARISON:  No comparison studies available. FINDINGS: No evidence for fracture. No features to suggest right femoral neck fracture. SI joints and symphysis pubis unremarkable. Coarse calcification over the left pelvis suggests fibroid disease. IMPRESSION: 1. No acute bony findings. Electronically Signed   By: Camellia Candle M.D.   On: 03/15/2024 09:57   CT Lumbar Spine Wo Contrast Result Date: 03/15/2024 EXAM: CT OF THE LUMBAR SPINE WITHOUT CONTRAST 03/15/2024 08:23:59 AM TECHNIQUE: CT of the lumbar spine was performed without the administration of intravenous contrast. Multiplanar reformatted images  are provided for review. Automated exposure control, iterative reconstruction, and/or weight based adjustment of the mA/kV was utilized to reduce the radiation dose to as low as reasonably achievable. COMPARISON: Lumbar spine MRI 09/26/2023, CT lumbar spine 09/10/2023. CLINICAL HISTORY: 68 year old female. Low back pain, increased fracture risk. FINDINGS: Transitional lumbosacral anatomy designated, with sacralized L5 which was also used on the previous MRI. BONES AND ALIGNMENT: Normal vertebral body heights. No acute fracture or suspicious bone lesion. Normal alignment. Generalized osteopenia. Stable vertebral height. Intact visible sacrum and SI joints. No acute osseous abnormality. SOFT TISSUES: No acute abnormality. Mild calcified atherosclerosis at the aortoiliac bifurcation. DEGENERATIVE CHANGES: Advanced chronic lower lumbar disc degeneration with pronounced chronic vacuum disc at L2-L3, L3-L4, and to a lesser extent L4-L5. Degenerative vacuum phenomenon also in the right side facets at those levels. Similar degenerative vacuum phenomena at the bilateral SI joints. Combined with lower lumbar facet and ligament flavum degeneration and hypertrophy, mild to moderate  multifactorial lumbar spinal stenosis at L2-L3, L3-L4 appears stable from the previous MRI. Moderate left and moderate to severe right L4 neural foraminal stenosis appears stable. IMPRESSION: 1. No acute osseous abnormality. 2. 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 Recommend correlation with radiographs prior to any operative intervention. 3. 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 Up to moderate associated multifactorial spinal stenosis , moderate to severe L4 neural foraminal stenosis. Electronically signed by: Helayne Hurst MD 03/15/2024 08:40 AM EST RP Workstation: [id number]    Microbiology: Results for orders placed or performed during the hospital encounter of  11/10/23  Culture, blood (routine x 2)     Status: None   Collection Time: 11/11/23 12:39 AM   Specimen: BLOOD LEFT FOREARM  Result Value Ref Range Status   Specimen Description   Final    BLOOD LEFT FOREARM Performed at [facility], [address], [place], [place] [address]    Special Requests   Final    BOTTLES DRAWN AEROBIC AND ANAEROBIC Blood Culture adequate volume Performed at [facility], [address] [place], [place] [address]    Culture   Final    NO GROWTH 5 DAYS Performed at [facility], [address] [place], [place] [address]    Report Status 11/16/2023 FINAL  Final    Labs: CBC: Recent Labs  Lab 03/20/24 0343 03/20/24 0423 03/21/24 0535 03/23/24 0215  WBC  --  9.2 7.8 8.9  NEUTROABS  --  5.8 4.6  --   HGB 12.9 10.9* 9.8* 9.9*  HCT 38.0 36.7 33.0* 32.7*  MCV  --  96.6 96.5 96.7  PLT  --  287 274 258   Basic Metabolic Panel: Recent Labs  Lab 03/21/24 0535 03/22/24 0440 03/23/24 0215 03/25/24 0503 03/26/24 0449  NA 138 136 137 138 139  K 4.5 4.6 5.0 4.7 4.1  CL 96* 96* 95* 93* 90*  CO2 36* 34* 37* 39* 43*  GLUCOSE 110* 106* 96 109* 101*  BUN 26* 36* 39* 43* 35*  CREATININE 1.95* 2.32* 1.71* 1.26* 0.93  CALCIUM 10.2 9.6 10.0 11.0* 11.4*  MG 2.1  --   --   --   --    Liver Function Tests: Recent Labs  Lab 03/20/24 0423 03/25/24 0503  AST 15 10*  ALT 7 5  ALKPHOS 88 78  BILITOT 0.4 0.4  PROT 7.5 6.6  ALBUMIN 4.2 3.5   CBG: Recent Labs  Lab 03/26/24 0746  GLUCAP 107*    Discharge time spent: {LESS THAN/GREATER THAN:26388} 30 minutes.  Signed: Concepcion Riser, MD [facility] 03/26/2024 "

## 2024-03-27 DIAGNOSIS — I5032 Chronic diastolic (congestive) heart failure: Secondary | ICD-10-CM | POA: Insufficient documentation

## 2024-03-28 ENCOUNTER — Telehealth: Payer: Self-pay | Admitting: *Deleted

## 2024-03-28 NOTE — Transitions of Care (Post Inpatient/ED Visit) (Signed)
" ° °  03/28/2024  Name: Janice Ryan MRN: [id number] DOB: 17-Jul-1955  Today's TOC FU Call Status: Today's TOC FU Call Status:: Unsuccessful Call (1st Attempt) Unsuccessful Call (1st Attempt) Date: 03/28/24  Attempted to reach the patient regarding the most recent Inpatient/ED visit.  Follow Up Plan: Additional outreach attempts will be made to reach the patient to complete the Transitions of Care (Post Inpatient/ED visit) call.   Andrea Dimes RN, BSN [place]  [facility] RN Care Manager [phone]  "

## 2024-03-31 ENCOUNTER — Telehealth: Payer: Self-pay | Admitting: *Deleted

## 2024-03-31 NOTE — Transitions of Care (Post Inpatient/ED Visit) (Signed)
" ° °  03/31/2024  Name: [name] MRN: [id number] DOB: 1955/07/01  Today's TOC FU Call Status: Today's TOC FU Call Status:: Unsuccessful Call (2nd Attempt) Unsuccessful Call (2nd Attempt) Date: 03/31/24  Attempted to reach the patient regarding the most recent Inpatient/ED visit.  Follow Up Plan: Additional outreach attempts will be made to reach the patient to complete the Transitions of Care (Post Inpatient/ED visit) call.   Andrea Dimes RN, BSN [place]  [facility] RN Care Manager [phone]  "

## 2024-04-01 ENCOUNTER — Telehealth: Payer: Self-pay | Admitting: *Deleted

## 2024-04-01 ENCOUNTER — Encounter: Admitting: Family

## 2024-04-01 NOTE — Transitions of Care (Post Inpatient/ED Visit) (Signed)
" ° °  04/01/2024  Name: Janice Ryan MRN: [id number] DOB: 1955/08/04  Today's TOC FU Call Status: Today's TOC FU Call Status:: Unsuccessful Call (3rd Attempt) Unsuccessful Call (3rd Attempt) Date: 04/01/24  Attempted to reach the patient regarding the most recent Inpatient/ED visit.  Call answered by Ms. Gubler. She was unable to complete the TOC Assessment. Patient stated I will call you back and ended the call.  Follow Up Plan: No further outreach attempts will be made at this time. We have been unable to contact the patient.  Andrea Dimes RN, BSN [place]  [facility] RN Care Manager [phone]  "

## 2024-04-01 NOTE — Progress Notes (Signed)
 Erroneous encounter-disregard

## 2024-04-02 ENCOUNTER — Telehealth: Payer: Self-pay | Admitting: *Deleted

## 2024-04-02 NOTE — Telephone Encounter (Signed)
 Noted

## 2024-04-02 NOTE — Telephone Encounter (Signed)
 Reason for CRM: patient is delaying PT per [facility]. Starting 1/6.

## 2024-04-16 ENCOUNTER — Other Ambulatory Visit: Payer: Self-pay

## 2024-04-16 ENCOUNTER — Other Ambulatory Visit (HOSPITAL_COMMUNITY): Payer: Self-pay

## 2024-04-16 ENCOUNTER — Ambulatory Visit: Payer: Self-pay

## 2024-04-16 MED ORDER — DULOXETINE HCL 60 MG PO CPEP
120.0000 mg | ORAL_CAPSULE | Freq: Every day | ORAL | 2 refills | Status: AC
  Filled 2024-04-16: qty 60, 30d supply, fill #0

## 2024-04-16 MED ORDER — QUETIAPINE FUMARATE 25 MG PO TABS
25.0000 mg | ORAL_TABLET | Freq: Every day | ORAL | 2 refills | Status: AC
  Filled 2024-04-16: qty 30, 30d supply, fill #0

## 2024-04-16 MED ORDER — AMPHETAMINE-DEXTROAMPHET ER 20 MG PO CP24
20.0000 mg | ORAL_CAPSULE | Freq: Every day | ORAL | 0 refills | Status: AC
  Filled 2024-04-16: qty 30, 30d supply, fill #0

## 2024-04-16 MED ORDER — MIRTAZAPINE 15 MG PO TABS
15.0000 mg | ORAL_TABLET | Freq: Every day | ORAL | 2 refills | Status: AC
  Filled 2024-04-16: qty 30, 30d supply, fill #0

## 2024-04-16 MED ORDER — AMPHETAMINE-DEXTROAMPHET ER 20 MG PO CP24: 20.0000 mg | ORAL_CAPSULE | Freq: Every day | ORAL | 0 refills | Status: AC

## 2024-04-16 MED ORDER — BUSPIRONE HCL 15 MG PO TABS
15.0000 mg | ORAL_TABLET | Freq: Three times a day (TID) | ORAL | 2 refills | Status: AC
  Filled 2024-04-16: qty 90, 30d supply, fill #0

## 2024-04-16 NOTE — Telephone Encounter (Signed)
 FYI Only or Action Required?: Action required by provider: ED advised; refused.  Patient was last seen in primary care on 12/26/2023 by Paseda, Folashade R, FNP.  Called Nurse Triage reporting Back Pain.  Symptoms began about a month ago.  Interventions attempted: OTC medications: Tylenol , ibuprofen , lidocaine  patch and cream.  Symptoms are: severe, better than when I was in the hospital.  Triage Disposition: Go to ED Now (Notify PCP)  Patient/caregiver understands and will follow disposition?:  No             Copied from CRM [id number]. Topic: Clinical - Red Word Triage >> Apr 16, 2024  3:17 PM Hadassah PARAS wrote: Red Word that prompted transfer to Nurse Triage: Worsening symtpoms with lower back, knee, and ankle. Severe pain and numbness. Discharged from hospital 12/31. Transferred to [facility] Reason for Disposition  Numbness in groin or rectal area (i.e., loss of sensation)  Answer Assessment - Initial Assessment Questions Patient was discharged on 03/27/24 from hospital admission. Swelling in right leg started before Christmas; diagnosed with edema.Massive pain/Back pain x 1 month. Reports getting worse: right lower back pain radiating down right leg. 8-10/10. Numbness, tightness, swelling in right leg. Requesting oxycodone  refill sent to [facility] and for a child psychotherapist, physical therapy referral to [facility]   Patient complaining of severe back pain and states numbness in groin/rectum. Stopped triage and advised ED for those symptoms. Patient immediately interrupted RN and states she is not going back to the hospital and she will feel better once she gets more oxycodone , sees her PCP and gets a physical therapy referral. She needs 3 day notice for transportation.  No chest pain, passing out or fainting, abdominal pain. SOB and is on 4L O2 which relieves the SOB.  Attempt made to call CAL, no answer.  Protocols used: Back Pain-A-AH

## 2024-04-17 ENCOUNTER — Other Ambulatory Visit (HOSPITAL_COMMUNITY): Payer: Self-pay

## 2024-04-18 NOTE — Telephone Encounter (Signed)
 I am unable to prescribe Oxycodone  per [facility] office policy. Schedule appointment for further discussion/management of present concerns. During the interim report to the Emergency Department/Urgent Care/call 911 for immediate medical evaluation.

## 2024-04-21 ENCOUNTER — Other Ambulatory Visit: Payer: Self-pay | Admitting: Family

## 2024-04-21 ENCOUNTER — Other Ambulatory Visit (HOSPITAL_COMMUNITY): Payer: Self-pay

## 2024-04-21 ENCOUNTER — Other Ambulatory Visit: Payer: Self-pay

## 2024-04-22 NOTE — Telephone Encounter (Signed)
 Discontinued 03/26/24.  Requested Prescriptions  Pending Prescriptions Disp Refills   lisinopril  (ZESTRIL ) 20 MG tablet 90 tablet 3    Sig: Take 1 tablet (20 mg total) by mouth daily.     Cardiovascular:  ACE Inhibitors Failed - 04/22/2024  9:34 AM      Failed - Valid encounter within last 6 months    Recent Outpatient Visits           11 months ago Hospital discharge follow-up   [facility] at [facility], [place], NP   1 year ago Medication refill   [place] [facility] at [facility], [name] J, NP   1 year ago Asthma, unspecified asthma severity, unspecified whether complicated, unspecified whether persistent   [place] [facility] at [facility], [name] J, NP   2 years ago Chronic frontal sinusitis   [place] [facility] at [facility], [name] J, NP   2 years ago Chronic frontal sinusitis   [place] [facility] at [facility], [name] J, NP              Passed - Cr in normal range and within 180 days    Creatinine, Ser  Date Value Ref Range Status  03/26/2024 0.93 0.44 - 1.00 mg/dL Final   Creatinine, Urine  Date Value Ref Range Status  03/20/2024 215 mg/dL Final    Comment:    NO NORMAL RANGE ESTABLISHED FOR THIS TEST Performed at [facility], [address], [place], [place] [address]          Passed - K in normal range and within 180 days    Potassium  Date Value Ref Range Status  03/26/2024 4.1 3.5 - 5.1 mmol/L Final         Passed - Patient is not pregnant      Passed - Last BP in normal range    BP Readings from Last 1 Encounters:  03/26/24 (!) 129/54

## 2024-04-24 NOTE — Telephone Encounter (Signed)
 LVM to call the office back to schedule appt.

## 2024-04-28 ENCOUNTER — Ambulatory Visit: Payer: Self-pay

## 2024-04-28 NOTE — Telephone Encounter (Signed)
 Message from Menasha Z sent at 04/28/2024 11:55 AM EST  Reason for Triage: severe knee, hip and ankle pain (arthritis)

## 2024-04-28 NOTE — Telephone Encounter (Signed)
 FYI Only or Action Required?: FYI only for provider: Advised UC.  Patient was last seen in primary care on 12/26/2023 by Paseda, Folashade R, FNP.  Called Nurse Triage reporting Hip Pain.  Symptoms began several months ago.  Interventions attempted: OTC medications: Pain meds.  Symptoms are: gradually worsening.  Triage Disposition: See HCP Within 4 Hours (Or PCP Triage)  Patient/caregiver understands and will follow disposition?: No, wishes to speak with PCP  Reason for Triage: severe knee, hip and ankle pain (arthritis)   Reason for Disposition  [1] SEVERE pain (e.g., excruciating, unable to do any normal activities) AND [2] not improved after 2 hours of pain medicine  Answer Assessment - Initial Assessment Questions Requesting pain medication   1. LOCATION and RADIATION: Where is the pain located? Does the pain spread (shoot) anywhere else?     Hip pain 2. QUALITY: What does the pain feel like?  (e.g., sharp, dull, aching, burning)      3. SEVERITY: How bad is the pain? What does it keep you from doing?   (Scale 1-10; or mild, moderate, severe)     9/10 4. ONSET: When did the pain start? Does it come and go, or is it there all the time?     Months ago 5. WORK OR EXERCISE: Has there been any recent work or exercise that involved this part of the body?       6. CAUSE: What do you think is causing the hip pain?       7. AGGRAVATING FACTORS: What makes the hip pain worse? (e.g., walking, climbing stairs, running)      8. OTHER SYMPTOMS: Do you have any other symptoms? (e.g., back pain, pain shooting down leg,  fever, rash)     Pain down the leg.  Protocols used: Hip Pain-A-AH

## 2024-04-28 NOTE — Telephone Encounter (Signed)
 Disposition noted.  Will routes as FYI to PCP

## 2024-04-29 NOTE — Telephone Encounter (Signed)
 Report to the Emergency Department/Urgent Care/call 911 for immediate medical evaluation. Follow-up with Primary Care.

## 2024-05-06 NOTE — Telephone Encounter (Signed)
 I have attempted without success to contact this patient by phone to return their call.
